# Patient Record
Sex: Female | Born: 1935 | Race: White | Hispanic: No | Smoking: Never smoker
Health system: Southern US, Community
[De-identification: ages and names within clinical notes are randomized; demographics above are authoritative.]

## PROBLEM LIST (undated history)

## (undated) DIAGNOSIS — D7582 Heparin induced thrombocytopenia (HIT): Secondary | ICD-10-CM

## (undated) DIAGNOSIS — I5022 Chronic systolic (congestive) heart failure: Secondary | ICD-10-CM

## (undated) DIAGNOSIS — F32A Depression, unspecified: Secondary | ICD-10-CM

## (undated) DIAGNOSIS — G2581 Restless legs syndrome: Secondary | ICD-10-CM

## (undated) DIAGNOSIS — M1990 Unspecified osteoarthritis, unspecified site: Secondary | ICD-10-CM

## (undated) DIAGNOSIS — M109 Gout, unspecified: Secondary | ICD-10-CM

## (undated) DIAGNOSIS — I10 Essential (primary) hypertension: Secondary | ICD-10-CM

## (undated) DIAGNOSIS — I517 Cardiomegaly: Secondary | ICD-10-CM

## (undated) DIAGNOSIS — F329 Major depressive disorder, single episode, unspecified: Secondary | ICD-10-CM

## (undated) DIAGNOSIS — I701 Atherosclerosis of renal artery: Secondary | ICD-10-CM

## (undated) DIAGNOSIS — I4891 Unspecified atrial fibrillation: Principal | ICD-10-CM

## (undated) DIAGNOSIS — E119 Type 2 diabetes mellitus without complications: Secondary | ICD-10-CM

## (undated) DIAGNOSIS — D649 Anemia, unspecified: Secondary | ICD-10-CM

## (undated) DIAGNOSIS — J449 Chronic obstructive pulmonary disease, unspecified: Secondary | ICD-10-CM

## (undated) DIAGNOSIS — I495 Sick sinus syndrome: Secondary | ICD-10-CM

## (undated) DIAGNOSIS — I2510 Atherosclerotic heart disease of native coronary artery without angina pectoris: Secondary | ICD-10-CM

## (undated) DIAGNOSIS — E039 Hypothyroidism, unspecified: Secondary | ICD-10-CM

## (undated) DIAGNOSIS — I729 Aneurysm of unspecified site: Secondary | ICD-10-CM

## (undated) DIAGNOSIS — K219 Gastro-esophageal reflux disease without esophagitis: Secondary | ICD-10-CM

## (undated) DIAGNOSIS — E785 Hyperlipidemia, unspecified: Secondary | ICD-10-CM

## (undated) DIAGNOSIS — N183 Chronic kidney disease, stage 3 unspecified: Secondary | ICD-10-CM

## (undated) DIAGNOSIS — J90 Pleural effusion, not elsewhere classified: Secondary | ICD-10-CM

## (undated) DIAGNOSIS — I255 Ischemic cardiomyopathy: Secondary | ICD-10-CM

## (undated) DIAGNOSIS — I739 Peripheral vascular disease, unspecified: Secondary | ICD-10-CM

## (undated) DIAGNOSIS — D75829 Heparin-induced thrombocytopenia, unspecified: Secondary | ICD-10-CM

## (undated) DIAGNOSIS — N289 Disorder of kidney and ureter, unspecified: Secondary | ICD-10-CM

## (undated) HISTORY — DX: Atherosclerotic heart disease of native coronary artery without angina pectoris: I25.10

## (undated) HISTORY — PX: CHOLECYSTECTOMY: SHX55

## (undated) HISTORY — PX: TONSILLECTOMY AND ADENOIDECTOMY: SUR1326

## (undated) HISTORY — DX: Unspecified osteoarthritis, unspecified site: M19.90

## (undated) HISTORY — DX: Ischemic cardiomyopathy: I25.5

## (undated) HISTORY — DX: Atherosclerosis of renal artery: I70.1

## (undated) HISTORY — DX: Hyperlipidemia, unspecified: E78.5

## (undated) HISTORY — DX: Essential (primary) hypertension: I10

## (undated) HISTORY — DX: Cardiomegaly: I51.7

## (undated) HISTORY — DX: Chronic systolic (congestive) heart failure: I50.22

## (undated) HISTORY — PX: THORACENTESIS: SHX235

## (undated) HISTORY — DX: Restless legs syndrome: G25.81

## (undated) HISTORY — DX: Aneurysm of unspecified site: I72.9

## (undated) HISTORY — DX: Peripheral vascular disease, unspecified: I73.9

## (undated) HISTORY — DX: Type 2 diabetes mellitus without complications: E11.9

---

## 1997-08-13 HISTORY — PX: CORONARY ANGIOPLASTY WITH STENT PLACEMENT: SHX49

## 1998-01-11 DIAGNOSIS — I729 Aneurysm of unspecified site: Secondary | ICD-10-CM

## 1998-01-11 HISTORY — DX: Aneurysm of unspecified site: I72.9

## 1998-01-11 HISTORY — PX: AV FISTULA REPAIR: SHX563

## 1998-02-03 ENCOUNTER — Inpatient Hospital Stay (HOSPITAL_COMMUNITY): Admission: EM | Admit: 1998-02-03 | Discharge: 1998-02-09 | Payer: Self-pay | Admitting: Cardiology

## 1998-05-25 ENCOUNTER — Emergency Department (HOSPITAL_COMMUNITY): Admission: EM | Admit: 1998-05-25 | Discharge: 1998-05-25 | Payer: Self-pay | Admitting: *Deleted

## 1998-05-25 ENCOUNTER — Encounter: Payer: Self-pay | Admitting: *Deleted

## 2001-06-03 ENCOUNTER — Encounter: Payer: Self-pay | Admitting: Emergency Medicine

## 2001-06-03 ENCOUNTER — Emergency Department (HOSPITAL_COMMUNITY): Admission: EM | Admit: 2001-06-03 | Discharge: 2001-06-03 | Payer: Self-pay | Admitting: Emergency Medicine

## 2001-11-13 ENCOUNTER — Emergency Department (HOSPITAL_COMMUNITY): Admission: EM | Admit: 2001-11-13 | Discharge: 2001-11-13 | Payer: Self-pay | Admitting: *Deleted

## 2001-11-13 ENCOUNTER — Encounter: Payer: Self-pay | Admitting: *Deleted

## 2002-03-24 ENCOUNTER — Emergency Department (HOSPITAL_COMMUNITY): Admission: EM | Admit: 2002-03-24 | Discharge: 2002-03-24 | Payer: Self-pay | Admitting: Emergency Medicine

## 2002-03-24 ENCOUNTER — Encounter: Payer: Self-pay | Admitting: Emergency Medicine

## 2002-04-02 ENCOUNTER — Encounter: Payer: Self-pay | Admitting: Orthopaedic Surgery

## 2002-04-02 ENCOUNTER — Ambulatory Visit (HOSPITAL_COMMUNITY): Admission: RE | Admit: 2002-04-02 | Discharge: 2002-04-02 | Payer: Self-pay | Admitting: Orthopaedic Surgery

## 2003-05-14 DIAGNOSIS — I779 Disorder of arteries and arterioles, unspecified: Secondary | ICD-10-CM

## 2003-05-14 HISTORY — PX: CAROTID ENDARTERECTOMY: SUR193

## 2003-05-14 HISTORY — PX: CORONARY ARTERY BYPASS GRAFT: SHX141

## 2003-05-14 HISTORY — DX: Disorder of arteries and arterioles, unspecified: I77.9

## 2003-06-08 ENCOUNTER — Encounter: Payer: Self-pay | Admitting: Emergency Medicine

## 2003-06-08 ENCOUNTER — Inpatient Hospital Stay (HOSPITAL_COMMUNITY): Admission: EM | Admit: 2003-06-08 | Discharge: 2003-06-20 | Payer: Self-pay | Admitting: Emergency Medicine

## 2003-06-09 ENCOUNTER — Encounter: Payer: Self-pay | Admitting: Cardiology

## 2003-06-11 ENCOUNTER — Encounter (INDEPENDENT_AMBULATORY_CARE_PROVIDER_SITE_OTHER): Payer: Self-pay | Admitting: *Deleted

## 2003-06-14 DIAGNOSIS — J90 Pleural effusion, not elsewhere classified: Secondary | ICD-10-CM

## 2003-06-14 HISTORY — DX: Pleural effusion, not elsewhere classified: J90

## 2003-06-14 HISTORY — PX: RENAL ARTERY STENT: SHX2321

## 2003-06-21 ENCOUNTER — Inpatient Hospital Stay (HOSPITAL_COMMUNITY): Admission: EM | Admit: 2003-06-21 | Discharge: 2003-07-01 | Payer: Self-pay | Admitting: Emergency Medicine

## 2003-06-21 ENCOUNTER — Encounter: Payer: Self-pay | Admitting: Internal Medicine

## 2003-08-16 ENCOUNTER — Encounter
Admission: RE | Admit: 2003-08-16 | Discharge: 2003-08-16 | Payer: Self-pay | Admitting: Thoracic Surgery (Cardiothoracic Vascular Surgery)

## 2004-03-22 ENCOUNTER — Inpatient Hospital Stay (HOSPITAL_COMMUNITY): Admission: EM | Admit: 2004-03-22 | Discharge: 2004-03-25 | Payer: Self-pay | Admitting: Emergency Medicine

## 2004-09-10 ENCOUNTER — Emergency Department (HOSPITAL_COMMUNITY): Admission: EM | Admit: 2004-09-10 | Discharge: 2004-09-10 | Payer: Self-pay | Admitting: Emergency Medicine

## 2004-09-11 ENCOUNTER — Inpatient Hospital Stay (HOSPITAL_COMMUNITY): Admission: EM | Admit: 2004-09-11 | Discharge: 2004-09-14 | Payer: Self-pay | Admitting: Emergency Medicine

## 2004-09-12 ENCOUNTER — Encounter: Payer: Self-pay | Admitting: Cardiology

## 2004-09-12 ENCOUNTER — Ambulatory Visit: Payer: Self-pay | Admitting: Cardiology

## 2004-09-18 ENCOUNTER — Ambulatory Visit: Payer: Self-pay | Admitting: Family Medicine

## 2004-10-19 ENCOUNTER — Ambulatory Visit: Payer: Self-pay | Admitting: Family Medicine

## 2004-12-15 ENCOUNTER — Emergency Department (HOSPITAL_COMMUNITY): Admission: EM | Admit: 2004-12-15 | Discharge: 2004-12-15 | Payer: Self-pay | Admitting: Emergency Medicine

## 2004-12-19 ENCOUNTER — Ambulatory Visit: Payer: Self-pay | Admitting: Family Medicine

## 2005-02-19 ENCOUNTER — Ambulatory Visit: Payer: Self-pay | Admitting: Family Medicine

## 2005-06-06 ENCOUNTER — Ambulatory Visit: Payer: Self-pay | Admitting: Family Medicine

## 2005-09-20 ENCOUNTER — Encounter: Payer: Self-pay | Admitting: Emergency Medicine

## 2005-09-20 ENCOUNTER — Inpatient Hospital Stay (HOSPITAL_COMMUNITY): Admission: AD | Admit: 2005-09-20 | Discharge: 2005-09-22 | Payer: Self-pay | Admitting: Cardiology

## 2005-09-20 ENCOUNTER — Ambulatory Visit: Payer: Self-pay | Admitting: Cardiology

## 2005-10-03 ENCOUNTER — Ambulatory Visit: Payer: Self-pay

## 2005-10-05 ENCOUNTER — Ambulatory Visit: Payer: Self-pay | Admitting: Internal Medicine

## 2005-10-08 ENCOUNTER — Ambulatory Visit: Payer: Self-pay | Admitting: Family Medicine

## 2005-11-26 ENCOUNTER — Ambulatory Visit: Payer: Self-pay | Admitting: Family Medicine

## 2005-11-28 ENCOUNTER — Ambulatory Visit: Payer: Self-pay | Admitting: Cardiology

## 2005-12-06 ENCOUNTER — Ambulatory Visit (HOSPITAL_COMMUNITY): Admission: RE | Admit: 2005-12-06 | Discharge: 2005-12-06 | Payer: Self-pay | Admitting: Orthopaedic Surgery

## 2006-08-09 ENCOUNTER — Ambulatory Visit: Payer: Self-pay | Admitting: Family Medicine

## 2006-09-29 ENCOUNTER — Emergency Department (HOSPITAL_COMMUNITY): Admission: EM | Admit: 2006-09-29 | Discharge: 2006-09-29 | Payer: Self-pay | Admitting: Emergency Medicine

## 2006-10-16 ENCOUNTER — Ambulatory Visit (HOSPITAL_COMMUNITY): Admission: RE | Admit: 2006-10-16 | Discharge: 2006-10-16 | Payer: Self-pay | Admitting: Ophthalmology

## 2006-10-31 ENCOUNTER — Ambulatory Visit (HOSPITAL_COMMUNITY): Admission: RE | Admit: 2006-10-31 | Discharge: 2006-10-31 | Payer: Self-pay | Admitting: Ophthalmology

## 2007-03-26 ENCOUNTER — Ambulatory Visit: Payer: Self-pay | Admitting: Cardiology

## 2008-04-05 ENCOUNTER — Inpatient Hospital Stay (HOSPITAL_COMMUNITY): Admission: EM | Admit: 2008-04-05 | Discharge: 2008-04-09 | Payer: Self-pay | Admitting: Emergency Medicine

## 2008-04-05 ENCOUNTER — Ambulatory Visit: Payer: Self-pay | Admitting: Cardiovascular Disease

## 2008-04-10 ENCOUNTER — Inpatient Hospital Stay (HOSPITAL_COMMUNITY): Admission: EM | Admit: 2008-04-10 | Discharge: 2008-04-12 | Payer: Self-pay | Admitting: Emergency Medicine

## 2008-04-28 ENCOUNTER — Ambulatory Visit: Payer: Self-pay | Admitting: Cardiology

## 2008-05-10 ENCOUNTER — Ambulatory Visit: Payer: Self-pay

## 2008-09-22 ENCOUNTER — Ambulatory Visit: Payer: Self-pay | Admitting: Cardiology

## 2008-11-01 ENCOUNTER — Emergency Department (HOSPITAL_COMMUNITY): Admission: EM | Admit: 2008-11-01 | Discharge: 2008-11-01 | Payer: Self-pay | Admitting: Emergency Medicine

## 2008-11-05 ENCOUNTER — Other Ambulatory Visit: Payer: Self-pay | Admitting: Surgery

## 2008-11-07 ENCOUNTER — Inpatient Hospital Stay (HOSPITAL_COMMUNITY): Admission: EM | Admit: 2008-11-07 | Discharge: 2008-11-09 | Payer: Self-pay | Admitting: Emergency Medicine

## 2008-11-08 ENCOUNTER — Encounter (INDEPENDENT_AMBULATORY_CARE_PROVIDER_SITE_OTHER): Payer: Self-pay | Admitting: Surgery

## 2009-07-04 ENCOUNTER — Encounter (INDEPENDENT_AMBULATORY_CARE_PROVIDER_SITE_OTHER): Payer: Self-pay | Admitting: *Deleted

## 2009-07-05 ENCOUNTER — Inpatient Hospital Stay (HOSPITAL_COMMUNITY): Admission: EM | Admit: 2009-07-05 | Discharge: 2009-07-06 | Payer: Self-pay | Admitting: Emergency Medicine

## 2009-07-06 ENCOUNTER — Ambulatory Visit: Payer: Self-pay | Admitting: Cardiology

## 2009-07-12 ENCOUNTER — Ambulatory Visit: Payer: Self-pay | Admitting: Cardiovascular Disease

## 2009-07-12 ENCOUNTER — Ambulatory Visit (HOSPITAL_COMMUNITY): Admission: RE | Admit: 2009-07-12 | Discharge: 2009-07-12 | Payer: Self-pay | Admitting: Cardiology

## 2009-07-12 ENCOUNTER — Encounter (INDEPENDENT_AMBULATORY_CARE_PROVIDER_SITE_OTHER): Payer: Self-pay | Admitting: Internal Medicine

## 2009-07-12 ENCOUNTER — Encounter: Payer: Self-pay | Admitting: Cardiology

## 2009-07-12 ENCOUNTER — Ambulatory Visit: Payer: Self-pay

## 2009-07-13 ENCOUNTER — Telehealth: Payer: Self-pay | Admitting: Cardiology

## 2009-08-09 DIAGNOSIS — M1990 Unspecified osteoarthritis, unspecified site: Secondary | ICD-10-CM | POA: Insufficient documentation

## 2009-08-09 DIAGNOSIS — N183 Chronic kidney disease, stage 3 (moderate): Secondary | ICD-10-CM

## 2009-08-09 DIAGNOSIS — E785 Hyperlipidemia, unspecified: Secondary | ICD-10-CM

## 2009-08-09 DIAGNOSIS — I10 Essential (primary) hypertension: Secondary | ICD-10-CM | POA: Insufficient documentation

## 2009-08-09 DIAGNOSIS — E119 Type 2 diabetes mellitus without complications: Secondary | ICD-10-CM | POA: Insufficient documentation

## 2009-08-09 DIAGNOSIS — I701 Atherosclerosis of renal artery: Secondary | ICD-10-CM

## 2009-08-09 DIAGNOSIS — G2581 Restless legs syndrome: Secondary | ICD-10-CM

## 2009-08-09 DIAGNOSIS — I2510 Atherosclerotic heart disease of native coronary artery without angina pectoris: Secondary | ICD-10-CM

## 2009-08-09 DIAGNOSIS — I080 Rheumatic disorders of both mitral and aortic valves: Secondary | ICD-10-CM | POA: Insufficient documentation

## 2009-08-29 ENCOUNTER — Encounter: Payer: Self-pay | Admitting: Cardiology

## 2009-08-30 ENCOUNTER — Inpatient Hospital Stay (HOSPITAL_COMMUNITY): Admission: EM | Admit: 2009-08-30 | Discharge: 2009-09-06 | Payer: Self-pay | Admitting: Emergency Medicine

## 2009-08-30 ENCOUNTER — Ambulatory Visit: Payer: Self-pay | Admitting: Internal Medicine

## 2009-09-01 ENCOUNTER — Ambulatory Visit: Payer: Self-pay | Admitting: Gastroenterology

## 2009-09-02 ENCOUNTER — Ambulatory Visit: Payer: Self-pay | Admitting: Cardiology

## 2009-09-12 ENCOUNTER — Emergency Department (HOSPITAL_COMMUNITY): Admission: EM | Admit: 2009-09-12 | Discharge: 2009-09-12 | Payer: Self-pay | Admitting: Emergency Medicine

## 2009-09-23 ENCOUNTER — Ambulatory Visit: Payer: Self-pay | Admitting: Internal Medicine

## 2009-09-23 ENCOUNTER — Inpatient Hospital Stay (HOSPITAL_COMMUNITY): Admission: EM | Admit: 2009-09-23 | Discharge: 2009-10-06 | Payer: Self-pay | Admitting: Emergency Medicine

## 2009-09-30 ENCOUNTER — Encounter (INDEPENDENT_AMBULATORY_CARE_PROVIDER_SITE_OTHER): Payer: Self-pay | Admitting: Family Medicine

## 2009-10-03 ENCOUNTER — Encounter: Payer: Self-pay | Admitting: Cardiology

## 2009-10-03 ENCOUNTER — Other Ambulatory Visit: Payer: Self-pay | Admitting: Cardiology

## 2009-10-03 ENCOUNTER — Ambulatory Visit: Payer: Self-pay | Admitting: Cardiology

## 2009-10-04 ENCOUNTER — Other Ambulatory Visit: Payer: Self-pay | Admitting: Cardiology

## 2009-10-05 ENCOUNTER — Other Ambulatory Visit: Payer: Self-pay | Admitting: Cardiology

## 2009-10-06 ENCOUNTER — Other Ambulatory Visit: Payer: Self-pay | Admitting: Cardiology

## 2009-10-10 ENCOUNTER — Inpatient Hospital Stay (HOSPITAL_COMMUNITY): Admission: EM | Admit: 2009-10-10 | Discharge: 2009-10-13 | Payer: Self-pay | Admitting: Emergency Medicine

## 2009-10-10 ENCOUNTER — Ambulatory Visit: Payer: Self-pay | Admitting: Cardiovascular Disease

## 2009-12-05 ENCOUNTER — Encounter: Payer: Self-pay | Admitting: Cardiology

## 2009-12-07 ENCOUNTER — Ambulatory Visit: Payer: Self-pay | Admitting: Cardiology

## 2010-03-02 ENCOUNTER — Inpatient Hospital Stay (HOSPITAL_COMMUNITY)
Admission: EM | Admit: 2010-03-02 | Discharge: 2010-03-04 | Payer: Self-pay | Source: Home / Self Care | Admitting: Emergency Medicine

## 2010-03-02 ENCOUNTER — Ambulatory Visit: Payer: Self-pay | Admitting: Internal Medicine

## 2010-03-07 ENCOUNTER — Encounter: Payer: Self-pay | Admitting: Cardiology

## 2010-03-14 ENCOUNTER — Encounter: Payer: Self-pay | Admitting: Cardiology

## 2010-03-18 IMAGING — CR DG CHEST 1V PORT
1 series · 1 of 1 positions shown · non-contrast
Comparison: PA and lateral chest 09/12/2009.

CLINICAL DATA: Nausea vomiting.  Epigastric pain.

PORTABLE CHEST - 1 VIEW

[view not recorded]
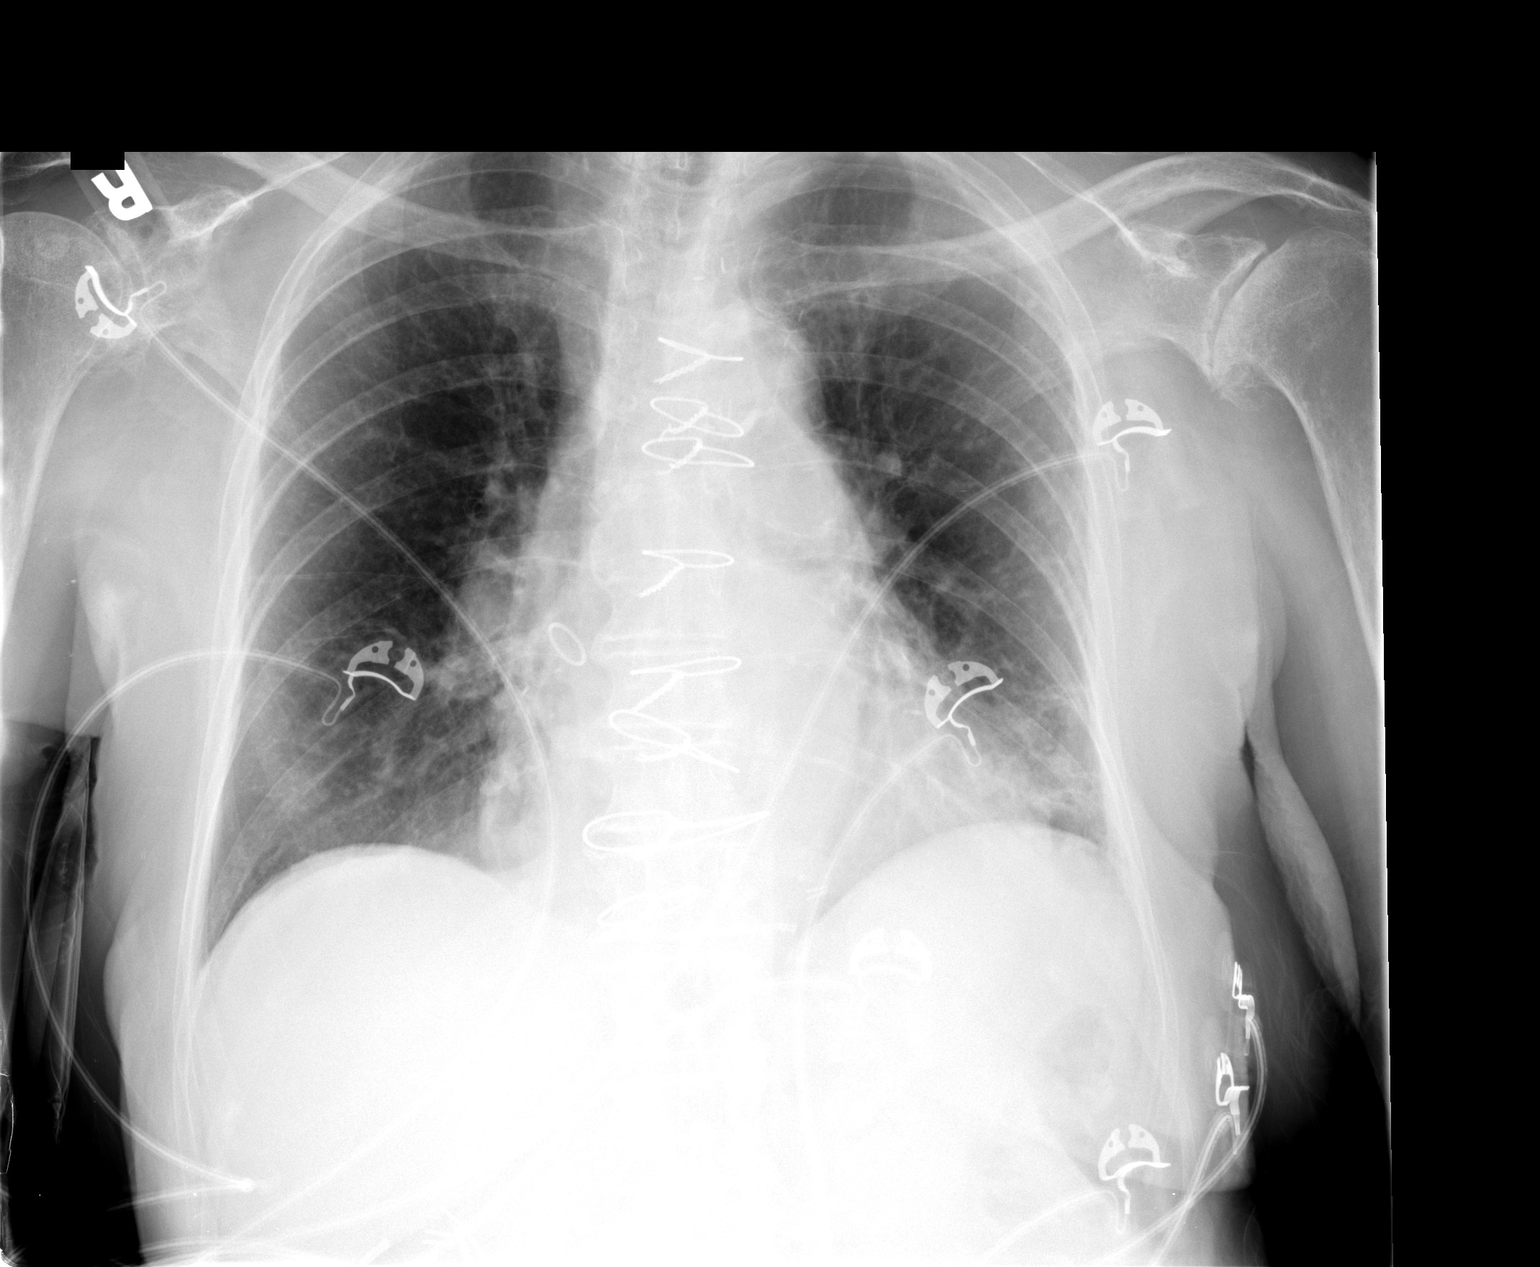

[1 of 1 positions shown; findings below may reference images not displayed]

FINDINGS: There is patchy left basilar airspace disease.  Right
lung clear.  No effusion.  Heart size normal.
IMPRESSION: Patchy left basilar air space disease could be due to atelectasis
or pneumonia.

## 2010-03-19 IMAGING — CR DG CHEST DECUBITUS*L*
1 series · 1 of 1 positions shown · non-contrast
Comparison: 10/10/2009

CLINICAL DATA: Shortness of breath, follow up pleural effusion

CHEST - LEFT DECUBITUS

[w chest decub.]
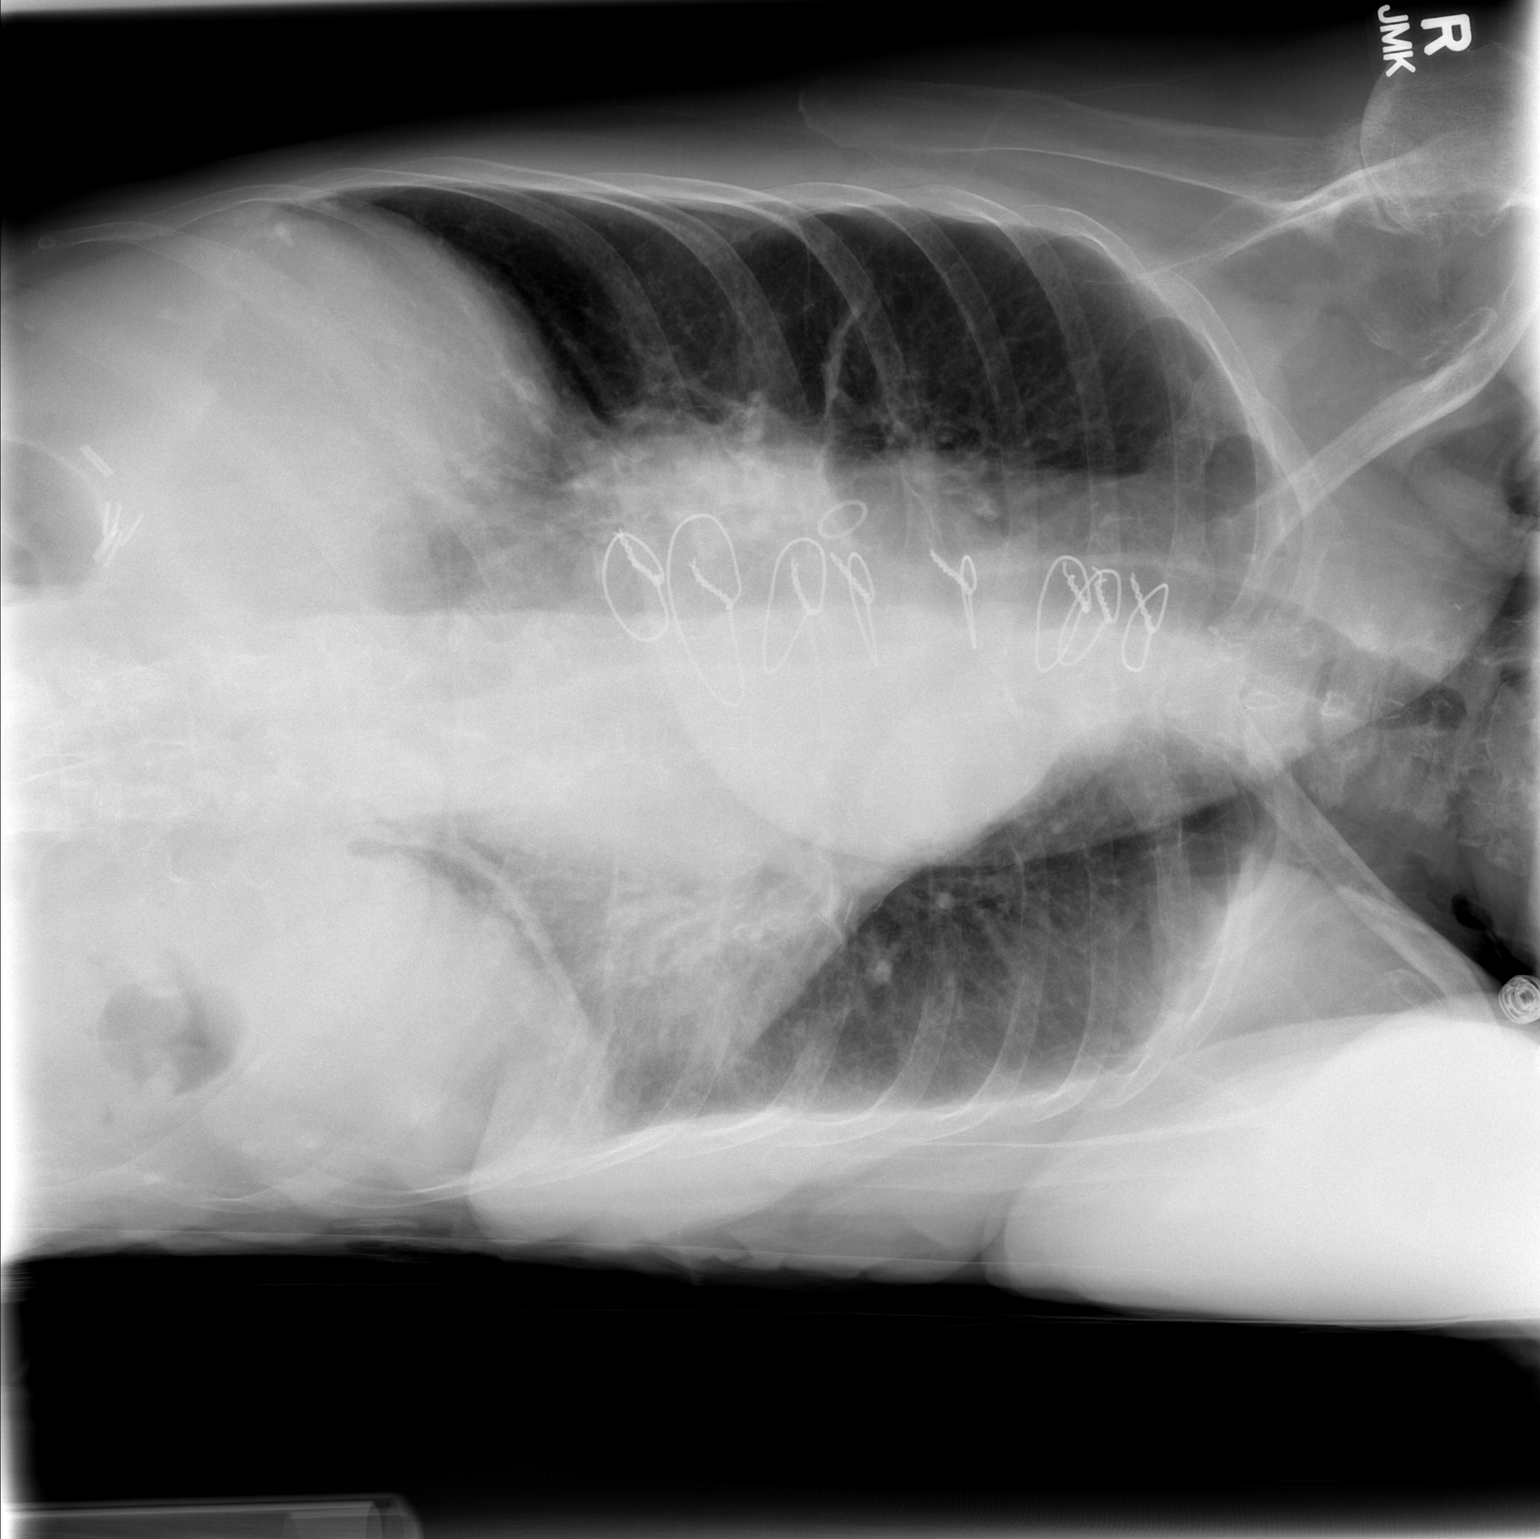

[1 of 1 positions shown; findings below may reference images not displayed]

FINDINGS: Trace, if any, left pleural fluid is identified without
evidence for loculation.  Right upper lobe atelectasis noted.
Evidence of prior CABG with persistent enlargement of the
cardiomediastinal silhouette noted. Right shoulder degenerative
changes are noted.
IMPRESSION: No significant left pleural effusion.

## 2010-03-22 ENCOUNTER — Ambulatory Visit: Payer: Self-pay | Admitting: Cardiology

## 2010-07-19 ENCOUNTER — Encounter: Payer: Self-pay | Admitting: Cardiology

## 2010-07-19 ENCOUNTER — Ambulatory Visit: Payer: Self-pay | Admitting: Cardiology

## 2010-07-24 ENCOUNTER — Encounter: Payer: Self-pay | Admitting: Cardiology

## 2010-07-24 ENCOUNTER — Emergency Department (HOSPITAL_COMMUNITY)
Admission: EM | Admit: 2010-07-24 | Discharge: 2010-07-24 | Payer: Self-pay | Source: Home / Self Care | Admitting: Internal Medicine

## 2010-09-03 ENCOUNTER — Encounter: Payer: Self-pay | Admitting: Cardiology

## 2010-09-12 NOTE — Miscellaneous (Signed)
  Clinical Lists Changes  Observations: Added new observation of CXR RESULTS: Findings: There is patchy left basilar airspace disease.  Right   lung clear.  No effusion.  Heart size normal.    IMPRESSION:   Patchy left basilar air space disease could be due to atelectasis   or pneumonia. (10/10/2009 9:24) Added new observation of TEEFINDING: - Left ventricle: The cavity size was normal. Wall thickness was     normal. Systolic function was moderately reduced. The estimated     ejection fraction was in the range of 35% to 40%.   - Aortic valve: Trileaflet; mildly calcified leaflets. There was no     stenosis. Mild regurgitation.   - Aorta: Normal caliber. Grade IV plaque in the aortic arch and     descending thoracic aorta.   - Mitral valve: Moderate regurgitation.   - Left atrium: The atrium was mildly dilated. No evidence of     thrombus in the atrial cavity or appendage.   - Right ventricle: Systolic function was mildly reduced.   - Right atrium: The atrium was mildly dilated. No evidence of     thrombus in the atrial cavity or appendage.   - Atrial septum: No defect or patent foramen ovale was identified.     Echo contrast study showed no right-to-left atrial level shunt, at     baseline or with provocation.   Impressions:    - EF appears moderately depressed but patient was in atrial     fibrillation with RVR. Would consider repeating study in sinus     rhythm. No LAA thrombus, ok for cardioversion.   Transesophageal echocardiography. 2D and color Doppler.  (10/03/2009 9:24)      TE Echocardiogram  Procedure date:  10/03/2009  Findings:      - Left ventricle: The cavity size was normal. Wall thickness was     normal. Systolic function was moderately reduced. The estimated     ejection fraction was in the range of 35% to 40%.   - Aortic valve: Trileaflet; mildly calcified leaflets. There was no     stenosis. Mild regurgitation.   - Aorta: Normal caliber. Grade IV  plaque in the aortic arch and     descending thoracic aorta.   - Mitral valve: Moderate regurgitation.   - Left atrium: The atrium was mildly dilated. No evidence of     thrombus in the atrial cavity or appendage.   - Right ventricle: Systolic function was mildly reduced.   - Right atrium: The atrium was mildly dilated. No evidence of     thrombus in the atrial cavity or appendage.   - Atrial septum: No defect or patent foramen ovale was identified.     Echo contrast study showed no right-to-left atrial level shunt, at     baseline or with provocation.   Impressions:    - EF appears moderately depressed but patient was in atrial     fibrillation with RVR. Would consider repeating study in sinus     rhythm. No LAA thrombus, ok for cardioversion.   Transesophageal echocardiography. 2D and color Doppler.   CXR  Procedure date:  10/10/2009  Findings:      Findings: There is patchy left basilar airspace disease.  Right   lung clear.  No effusion.  Heart size normal.    IMPRESSION:   Patchy left basilar air space disease could be due to atelectasis   or pneumonia.

## 2010-09-12 NOTE — Miscellaneous (Signed)
Clinical Lists Changes  Observations: Added new observation of CARDCATHFIND: 1. Hemodynamics:  LV 172/12, AO 178/73. 2. Coronaries:  The main was normal.  The LAD was heavily calcified.     There was long proximal tandem 80% lesions.  There was long mid 95%     stenosis.  There was distal 40% stenosis.  Ramus intermediate was     large with mid 40% stenosis.  It was a branching vessel.  The right     coronary artery was occluded proximally.  There was mid diffuse     severe disease.  PDA was large with mild nonobstructive disease. 3. Grafts:  A LIMA to the LAD was widely patent.  Saphenous vein graft     to the PDA was widely patent. 4. Left ventriculogram:  The left ventriculogram was obtained in the     RAO projection.  The EF was 65% with normal wall motion.   CONCLUSION:  Severe two-vessel coronary artery disease.  Patent grafts with a preserved ejection fraction.   PLAN:  The patient will have medical management. (03/03/2010 10:33) Added new observation of TEEFINDING:   - Left ventricle: The cavity size was normal. Wall thickness was     normal. Systolic function was moderately reduced. The estimated     ejection fraction was in the range of 35% to 40%.   - Aortic valve: Trileaflet; mildly calcified leaflets. There was no     stenosis. Mild regurgitation.   - Aorta: Normal caliber. Grade IV plaque in the aortic arch and     descending thoracic aorta.   - Mitral valve: Moderate regurgitation.   - Left atrium: The atrium was mildly dilated. No evidence of     thrombus in the atrial cavity or appendage.   - Right ventricle: Systolic function was mildly reduced.   - Right atrium: The atrium was mildly dilated. No evidence of     thrombus in the atrial cavity or appendage.   - Atrial septum: No defect or patent foramen ovale was identified.     Echo contrast study showed no right-to-left atrial level shunt, at     baseline or with provocation.   Impressions:    - EF appears  moderately depressed but patient was in atrial     fibrillation with RVR. Would consider repeating study in sinus     rhythm. No LAA thrombus, ok for cardioversion. (10/03/2009 10:33) Added new observation of ECHOINTERP:  - Left ventricle: The cavity size was normal. Wall thickness was     increased in a pattern of mild LVH. Systolic function was mildly     to moderately reduced. The estimated ejection fraction was in the     range of 40% to 45%. There is hypokinesis of the posterior basal     wall.. Doppler parameters are consistent with high ventricular     filling pressure.   - Aortic valve: Moderate regurgitation.   - Mitral valve: Moderate regurgitation.   - Left atrium: The atrium was mildly dilated.   - Right atrium: The atrium was mildly dilated.   - Tricuspid valve: Moderate regurgitation.   - Pulmonary arteries: Systolic pressure was moderately increased. PA     peak pressure: 59mm Hg (S).  (09/30/2009 10:34)      Cardiac Cath  Procedure date:  03/03/2010  Findings:      1. Hemodynamics:  LV 172/12, AO 178/73. 2. Coronaries:  The main was normal.  The LAD was heavily calcified.  There was long proximal tandem 80% lesions.  There was long mid 95%     stenosis.  There was distal 40% stenosis.  Ramus intermediate was     large with mid 40% stenosis.  It was a branching vessel.  The right     coronary artery was occluded proximally.  There was mid diffuse     severe disease.  PDA was large with mild nonobstructive disease. 3. Grafts:  A LIMA to the LAD was widely patent.  Saphenous vein graft     to the PDA was widely patent. 4. Left ventriculogram:  The left ventriculogram was obtained in the     RAO projection.  The EF was 65% with normal wall motion.   CONCLUSION:  Severe two-vessel coronary artery disease.  Patent grafts with a preserved ejection fraction.   PLAN:  The patient will have medical management.  TE Echocardiogram  Procedure date:   10/03/2009  Findings:        - Left ventricle: The cavity size was normal. Wall thickness was     normal. Systolic function was moderately reduced. The estimated     ejection fraction was in the range of 35% to 40%.   - Aortic valve: Trileaflet; mildly calcified leaflets. There was no     stenosis. Mild regurgitation.   - Aorta: Normal caliber. Grade IV plaque in the aortic arch and     descending thoracic aorta.   - Mitral valve: Moderate regurgitation.   - Left atrium: The atrium was mildly dilated. No evidence of     thrombus in the atrial cavity or appendage.   - Right ventricle: Systolic function was mildly reduced.   - Right atrium: The atrium was mildly dilated. No evidence of     thrombus in the atrial cavity or appendage.   - Atrial septum: No defect or patent foramen ovale was identified.     Echo contrast study showed no right-to-left atrial level shunt, at     baseline or with provocation.   Impressions:    - EF appears moderately depressed but patient was in atrial     fibrillation with RVR. Would consider repeating study in sinus     rhythm. No LAA thrombus, ok for cardioversion.  Echocardiogram  Procedure date:  09/30/2009  Findings:       - Left ventricle: The cavity size was normal. Wall thickness was     increased in a pattern of mild LVH. Systolic function was mildly     to moderately reduced. The estimated ejection fraction was in the     range of 40% to 45%. There is hypokinesis of the posterior basal     wall.. Doppler parameters are consistent with high ventricular     filling pressure.   - Aortic valve: Moderate regurgitation.   - Mitral valve: Moderate regurgitation.   - Left atrium: The atrium was mildly dilated.   - Right atrium: The atrium was mildly dilated.   - Tricuspid valve: Moderate regurgitation.   - Pulmonary arteries: Systolic pressure was moderately increased. PA     peak pressure: 59mm Hg (S).

## 2010-09-12 NOTE — Assessment & Plan Note (Signed)
Summary: [name] [name]      Allergies Added:   Visit Type:  Follow-up Primary Provider:  Dr. Ardeen [name]  CC:  CAD.  History of Present Illness: The patient returns for followup. She said she has been doing well and denies any ongoing cardiovascular symptoms. She has had no chest pressure, neck or arm discomfort. She has had no palpitations, presyncope or syncope. He denies any shortness of breath, PND or orthopnea. She tolerates her blood thinner.  Current Medications (verified): 1)  Hydrocodone-Acetaminophen 7.5-750 Mg Tabs (Hydrocodone-Acetaminophen) .... 2-4 Tabs As Needed 2)  Omeprazole 20 Mg Cpdr (Omeprazole) .Marland Kitchen.. 1 By Mouth Daily 3)  Klor-Con M20 20 Meq Cr-Tabs (Potassium Chloride Crys Cr) .Marland Kitchen.. 1 By Mouth Daily 4)  Alprazolam 0.25 Mg Tabs (Alprazolam) .... As Directed 5)  Coreg 3.125 Mg Tabs (Carvedilol) .... 3 By Mouth Two Times A Day 6)  Warfarin Sodium 3 Mg Tabs (Warfarin Sodium) .... As Directed 7)  Simvastatin 20 Mg Tabs (Simvastatin) .Marland Kitchen.. 1 P Odaily 8)  Enalapril Maleate 5 Mg Tabs (Enalapril Maleate) .Marland Kitchen.. 1 By Mouth Two Times A Day 9)  Furosemide 40 Mg Tabs (Furosemide) .Marland Kitchen.. 1 By Mouth Daily 10)  Nitrostat 0.4 Mg Subl (Nitroglycerin) .... As Needed 11)  Colace 100 Mg Caps (Docusate Sodium) .Marland Kitchen.. 1 By Mouth Daily  Allergies (verified): 1)  ! Heparin 2)  ! * Altace 3)  ! * Ivp Dye 4)  ! * Dilaudid  Past History:  Past Medical History: Reviewed history from 03/22/2010 and no changes required.  1. Coronary artery disease (status post LAD stenting in 1999.  CABG in       2004 with a LIMA to the LAD and sequential SVG to PDA.  Cath 02/2009  95% LAD       with a patent LIMA to LAD. Ramus intermedius had a 40% stenosis.  The RCA        was occluded, but the SVG  to PDA was widely patent and her EF was 65%.)  2. Hypertension.   3. Hyperlipidemia.   4. Diabetes mellitus (the patient reports that she does not have this       and is not on therapy)  5. Bilateral renal  artery stenosis, status post stenting.   6. Carotid artery disease, status post right carotid endarterectomy.   7. Degenerative joint disease.   8. Pseudoaneurysm, status post repair following catheterization in       1999.   9. Contrast allergy.   10.Mild mitral regurgitation.   11.Mild renal insufficiency.   12.Restless leg syndrome.      Past Surgical History: Reviewed history from 08/09/2009 and no changes required.  1. CABG in 2004.   2. Right carotid endarterectomy.   3. Laparoscopic cholecystectomy in 2010.   4. Repair of pseudoaneurysm after cardiac catheterization.   5. Repair of right rotator cuff.   Review of Systems       As stated in the HPI and negative for all other systems.   Vital Signs:  Patient profile:   77 year old female Height:      64 inches Weight:      115 pounds BMI:     19.81 Pulse rate:   108 / minute Resp:     16 per minute BP sitting:   138 / 76  (right arm)  Vitals Entered By: [name] Coy, CNA (July 19, 2010 1:06 PM)  Physical Exam  General:  Well developed, well nourished, in no acute  distress. Head:  normocephalic and atraumatic Mouth:   Oral mucosa normal. Neck:  Neck supple, no JVD. No masses, thyromegaly or abnormal cervical nodes, left bruit. Chest Wall:  Well healed sternotomy scar Lungs:  Clear bilaterally to auscultation and percussion. Heart:  Non-displaced PMI, chest non-tender; irregular rate and rhythm, S1, S2 without murmurs, rubs or gallops. Carotid upstroke normal. . Normal abdominal aortic size, no bruits. Femorals normal pulses, no bruits. Pedals normal pulses. No edema, no varicosities. Abdomen:  Bowel sounds positive; abdomen soft and non-tender without masses, organomegaly, or hernias noted. No hepatosplenomegaly. Msk:  Back normal, normal gait. Muscle strength and tone normal. Extremities:  No clubbing or cyanosis. Neurologic:  Alert and oriented x 3. Skin:  Intact without lesions or rashes. Cervical Nodes:   no significant adenopathy Psych:  Normal affect.   EKG  Procedure date:  07/19/2010  Findings:      Atrial fibrillation, rightward axis, diffuse ST-T wave changes ,nonspecific  Impression & Recommendations:  Problem # 1:  ATRIAL FIBRILLATION (ICD-427.31)  She tolerates Coumadin. Her aspirin has been stopped because she is on this and I agree with this. No change in therapy is indicated.  Orders: EKG w/ Interpretation (93000)  Problem # 2:  CAD (ICD-414.00)  She is having no chest discomfort. No change in therapy is indicated.  The following medications were removed from the medication list:    Aspirin 81 Mg Tabs (Aspirin) .Marland Kitchen... 1 podaily Her updated medication list for this problem includes:    Coreg 3.125 Mg Tabs (Carvedilol) .Marland KitchenMarland KitchenMarland KitchenMarland Kitchen 3 by mouth two times a day    Warfarin Sodium 3 Mg Tabs (Warfarin sodium) .Marland Kitchen... As directed    Enalapril Maleate 5 Mg Tabs (Enalapril maleate) .Marland Kitchen... 1 by mouth two times a day    Nitrostat 0.4 Mg Subl (Nitroglycerin) .Marland Kitchen... As needed  Orders: EKG w/ Interpretation (93000)  Problem # 3:  HYPERTENSION (ICD-401.9) Her blood pressure is controlled and she will continue the meds as listed.  Patient Instructions: 1)  Your physician recommends that you schedule a follow-up appointment in: 1 year 2)  Your physician recommends that you continue on your current medications as directed. Please refer to the Current Medication list given to you today.

## 2010-09-12 NOTE — Miscellaneous (Signed)
Summary: [facility]   Imported By: Roderic Ovens 09/19/2009 13:25:30  _____________________________________________________________________  External Attachment:    Type:   Image     Comment:   External Document

## 2010-09-12 NOTE — Assessment & Plan Note (Signed)
Summary: [facility]  Medications Added ASPIRIN 81 MG  TABS (ASPIRIN) 1 podaily POLYETHYLENE GLYCOL 3350  PACK (POLYETHYLENE GLYCOL 3350) as directed OMEPRAZOLE 20 MG CPDR (OMEPRAZOLE) 1 by mouth daily KLOR-CON M20 20 MEQ CR-TABS (POTASSIUM CHLORIDE CRYS CR) 1 by mouth two times a day ALPRAZOLAM 0.25 MG TABS (ALPRAZOLAM) as directed COREG 3.125 MG TABS (CARVEDILOL) 3 by mouth two times a day WARFARIN SODIUM 3 MG TABS (WARFARIN SODIUM) as directed SIMVASTATIN 20 MG TABS (SIMVASTATIN) 1 p odaily ENALAPRIL MALEATE 5 MG TABS (ENALAPRIL MALEATE) 1 by mouth two times a day LIDODERM 5 % PTCH (LIDOCAINE) as direted FUROSEMIDE 40 MG TABS (FUROSEMIDE) 1 by mouth daily      Allergies Added: ! HEPARIN ! * ALTACE ! * IVP DYE  Visit Type:  Follow-up Primary Provider:  Dr. Ardeen Garland  CC:  Atrial Fibrillation.  History of Present Illness: The patient presents for follow up after a recent hospitalization for CHF and atrial fibrillation.  She underwent TEE guided cardioversion.  Since that hospitalization she has improved.  She denies chest pain, neck or arm pain.  She has had no further shortness of breath.  She denies palpitations, presyncope or syncope.  She has had no weight gain or edema..  Current Medications (verified): 1)  Hydrocodone-Acetaminophen 7.5-750 Mg Tabs (Hydrocodone-Acetaminophen) .... 2-4 Tabs As Needed 2)  Aspirin 81 Mg  Tabs (Aspirin) .Marland Kitchen.. 1 Podaily 3)  Polyethylene Glycol 3350  Pack (Polyethylene Glycol 3350) .... As Directed 4)  Omeprazole 20 Mg Cpdr (Omeprazole) .Marland Kitchen.. 1 By Mouth Daily 5)  Klor-Con M20 20 Meq Cr-Tabs (Potassium Chloride Crys Cr) .Marland Kitchen.. 1 By Mouth Two Times A Day 6)  Alprazolam 0.25 Mg Tabs (Alprazolam) .... As Directed 7)  Coreg 3.125 Mg Tabs (Carvedilol) .... 3 By Mouth Two Times A Day 8)  Warfarin Sodium 3 Mg Tabs (Warfarin Sodium) .... As Directed 9)  Simvastatin 20 Mg Tabs (Simvastatin) .Marland Kitchen.. 1 P Odaily 10)  Enalapril Maleate 5 Mg Tabs (Enalapril  Maleate) .Marland Kitchen.. 1 By Mouth Two Times A Day 11)  Lidoderm 5 % Ptch (Lidocaine) .... As Direted 12)  Furosemide 40 Mg Tabs (Furosemide) .Marland Kitchen.. 1 By Mouth Daily  Allergies (verified): 1)  ! Heparin 2)  ! * Altace 3)  ! * Ivp Dye  Past History:  Past Medical History: Reviewed history from 08/09/2009 and no changes required.  1. Coronary artery disease (status post LAD stenting in 1999.  CABG in       2004 with a LIMA to the LAD and sequential SVG to PDA.  Her most       recent catheterization was in August 2009).   2. Hypertension.   3. Hyperlipidemia.   4. Diabetes mellitus (the patient reports that she does not have this       and is not on therapy for).   5. Bilateral renal artery stenosis, status post stenting.   6. Carotid artery disease, status post right carotid endarterectomy.   7. Degenerative joint disease.   8. Pseudoaneurysm, status post repair following catheterization in       1999.   9. Contrast allergy.   10.Mild mitral regurgitation.   11.Mild renal insufficiency.   12.Restless leg syndrome.      Past Surgical History: Reviewed history from 08/09/2009 and no changes required.  1. CABG in 2004.   2. Right carotid endarterectomy.   3. Laparoscopic cholecystectomy in 2010.   4. Repair of pseudoaneurysm after cardiac catheterization.   5. Repair of right  rotator cuff.   Review of Systems       As stated in the HPI and negative for all other systems.   Vital Signs:  Patient profile:   77 year old female Height:      64 inches Weight:      112 pounds BMI:     19.29 Pulse rate:   65 / minute Resp:     16 per minute BP sitting:   198 / 82  (right arm)  Vitals Entered By: Marrion Coy, CNA (December 07, 2009 12:12 PM)  Physical Exam  General:  Well developed, well nourished, in no acute distress. Head:  normocephalic and atraumatic Eyes:  PERRLA/EOM intact; conjunctiva and lids normal. Mouth:  Teeth, gums and palate normal. Oral mucosa normal. Neck:  Neck  supple, no JVD. No masses, thyromegaly or abnormal cervical nodes, left bruit. Chest Wall:  no deformities or breast masses noted Lungs:  Clear bilaterally to auscultation and percussion. Heart:  Non-displaced PMI, chest non-tender; regular rate and rhythm, S1, S2 without murmurs, rubs or gallops. Carotid upstroke normal. . Normal abdominal aortic size, no bruits. Femorals normal pulses, no bruits. Pedals normal pulses. No edema, no varicosities. Abdomen:  Bowel sounds positive; abdomen soft and non-tender without masses, organomegaly, or hernias noted. No hepatosplenomegaly. Msk:  Back normal, normal gait. Muscle strength and tone normal. Extremities:  No clubbing or cyanosis. Neurologic:  Alert and oriented x 3. Skin:  Intact without lesions or rashes. Psych:  Normal affect.   EKG  Procedure date:  12/07/2009  Findings:      NSR, no acute ST T wave changes.  Impression & Recommendations:  Problem # 1:  ATRIAL FIBRILLATION (ICD-427.31) She has had no recurrence of symptomatic atrial fibrillation.  She will continue with meds as listed. Orders: EKG w/ Interpretation (93000)  Problem # 2:  HYPERTENSION (ICD-401.9) Her blood pressure is elevated today.  However, she did not take her medications today.  She and her daughter report that her blood pressure has been well controlled.  She will keep a blood pressure diary.  I will make adjustments based on this.  Problem # 3:  CAD (ICD-414.00) She has no ongoing symptoms.   No futher cardiovascular testing is indicated. Orders: EKG w/ Interpretation (93000)  Patient Instructions: 1)  Your physician recommends that you schedule a follow-up appointment in: 3 MONTHS 2)  Your physician recommends that you continue on your current medications as directed. Please refer to the Current Medication list given to you today. 3)  You have been diagnosed with atrial fibrillation.  Atrial fibrillation is a condition in which one of the upper chambers  of the heart has extra electrical cells causing it to beat very fast.  Please see the handout/brochure given to you today for further information.

## 2010-09-12 NOTE — Assessment & Plan Note (Signed)
Summary: [facility]  Medications Added KLOR-CON M20 20 MEQ CR-TABS (POTASSIUM CHLORIDE CRYS CR) 1 by mouth daily ACETAMINOPHEN 325 MG  TABS (ACETAMINOPHEN) as needed NITROSTAT 0.4 MG SUBL (NITROGLYCERIN) as needed COLACE 100 MG CAPS (DOCUSATE SODIUM) 1 by mouth daily      Allergies Added: ! * DILAUDID  Visit Type:  Follow-up Primary Provider:  Dr. Ardeen Garland  CC:  CAD.  History of Present Illness: The Carollee Herter presents for followup after recent hospitalization and catheterization for chest pain. She ruled out for myocardial infarction. Cardiac catheterization as described below. She had residual coronary disease was managed medically. Since that time she has done well. She has had no further chest discomfort, neck or arm discomfort. She has had no palpitations, presyncope or syncope. She denies any shortness of breath, PND or orthopnea.  Current Medications (verified): 1)  Hydrocodone-Acetaminophen 7.5-750 Mg Tabs (Hydrocodone-Acetaminophen) .... 2-4 Tabs As Needed 2)  Aspirin 81 Mg  Tabs (Aspirin) .Marland Kitchen.. 1 Podaily 3)  Omeprazole 20 Mg Cpdr (Omeprazole) .Marland Kitchen.. 1 By Mouth Daily 4)  Klor-Con M20 20 Meq Cr-Tabs (Potassium Chloride Crys Cr) .Marland Kitchen.. 1 By Mouth Daily 5)  Alprazolam 0.25 Mg Tabs (Alprazolam) .... As Directed 6)  Coreg 3.125 Mg Tabs (Carvedilol) .... 3 By Mouth Two Times A Day 7)  Warfarin Sodium 3 Mg Tabs (Warfarin Sodium) .... As Directed 8)  Simvastatin 20 Mg Tabs (Simvastatin) .Marland Kitchen.. 1 P Odaily 9)  Enalapril Maleate 5 Mg Tabs (Enalapril Maleate) .Marland Kitchen.. 1 By Mouth Two Times A Day 10)  Lidoderm 5 % Ptch (Lidocaine) .... As Direted 11)  Furosemide 40 Mg Tabs (Furosemide) .Marland Kitchen.. 1 By Mouth Daily 12)  Acetaminophen 325 Mg  Tabs (Acetaminophen) .... As Needed 13)  Nitrostat 0.4 Mg Subl (Nitroglycerin) .... As Needed 14)  Colace 100 Mg Caps (Docusate Sodium) .Marland Kitchen.. 1 By Mouth Daily  Allergies (verified): 1)  ! Heparin 2)  ! * Altace 3)  ! * Ivp Dye 4)  ! * Dilaudid  Past  History:  Past Medical History:  1. Coronary artery disease (status post LAD stenting in 1999.  CABG in       2004 with a LIMA to the LAD and sequential SVG to PDA.  Cath 02/2009  95% LAD       with a patent LIMA to LAD. Ramus intermedius had a 40% stenosis.  The RCA        was occluded, but the SVG  to PDA was widely patent and her EF was 65%.)  2. Hypertension.   3. Hyperlipidemia.   4. Diabetes mellitus (the patient reports that she does not have this       and is not on therapy)  5. Bilateral renal artery stenosis, status post stenting.   6. Carotid artery disease, status post right carotid endarterectomy.   7. Degenerative joint disease.   8. Pseudoaneurysm, status post repair following catheterization in       1999.   9. Contrast allergy.   10.Mild mitral regurgitation.   11.Mild renal insufficiency.   12.Restless leg syndrome.      Past Surgical History: Reviewed history from 08/09/2009 and no changes required.  1. CABG in 2004.   2. Right carotid endarterectomy.   3. Laparoscopic cholecystectomy in 2010.   4. Repair of pseudoaneurysm after cardiac catheterization.   5. Repair of right rotator cuff.   Review of Systems       As stated in the HPI and negative for all other systems.  Vital Signs:  Patient profile:   77 year old female Height:      64 inches Weight:      116 pounds BMI:     19.98 Pulse rate:   55 / minute Resp:     16 per minute BP sitting:   124 / 66  (right arm)  Vitals Entered By: Marrion Coy, CNA (March 22, 2010 12:58 PM)  Physical Exam  General:  Well developed, well nourished, in no acute distress. Head:  normocephalic and atraumatic Eyes:  PERRLA/EOM intact; conjunctiva and lids normal. Neck:  Neck supple, no JVD. No masses, thyromegaly or abnormal cervical nodes, left bruit. Chest Wall:  Well healed sternotomy scar Lungs:  Clear bilaterally to auscultation and percussion. Heart:  Non-displaced PMI, chest non-tender; regular rate  and rhythm, S1, S2 without murmurs, rubs or gallops. Carotid upstroke normal. . Normal abdominal aortic size, no bruits. Femorals normal pulses, no bruits. Pedals normal pulses. No edema, no varicosities. Abdomen:  Bowel sounds positive; abdomen soft and non-tender without masses, organomegaly, or hernias noted. No hepatosplenomegaly. Msk:  Back normal, normal gait. Muscle strength and tone normal. Extremities:  No clubbing or cyanosis. Neurologic:  Alert and oriented x 3. Skin:  Intact without lesions or rashes. Psych:  Normal affect.   EKG  Procedure date:  03/22/2010  Findings:      Sinus bradycardia, premature atrial contractions, axis within normal limits, intervals within normal limits, no acute ST-T wave changes  Impression & Recommendations:  Problem # 1:  CAD (ICD-414.00) She is having no new symptoms. No further cardiovascular testing is suggested. She will continue with risk reduction. Orders: EKG w/ Interpretation (93000)  Problem # 2:  RENAL INSUFFICIENCY (ICD-588.9) This was reassessed with a basic metabolic profile after discharge and her creatinine was near normal  Problem # 3:  ATRIAL FIBRILLATION (ICD-427.31) The patient tolerates rate control and anticoagulation. Orders: EKG w/ Interpretation (93000)  Problem # 4:  HYPERTENSION (ICD-401.9) Her blood pressure is well controlled and she will continue on the meds as listed.  Patient Instructions: 1)  Your physician recommends that you schedule a follow-up appointment in: 4 months in the [facility] office  December  7 , 2011 at 1pm. 2)  Your physician recommends that you continue on your current medications as directed. Please refer to the Current Medication list given to you today.

## 2010-09-14 NOTE — Consult Note (Signed)
Summary: [facility]  [facility]   Imported By: Roderic Ovens 08/10/2010 12:14:40  _____________________________________________________________________  External Attachment:    Type:   Image     Comment:   External Document

## 2010-10-23 LAB — CBC
Platelets: 258 10*3/uL (ref 150–400)
RBC: 4.28 MIL/uL (ref 3.87–5.11)
WBC: 12.9 10*3/uL — ABNORMAL HIGH (ref 4.0–10.5)

## 2010-10-23 LAB — URINALYSIS, ROUTINE W REFLEX MICROSCOPIC
Glucose, UA: NEGATIVE mg/dL
Hgb urine dipstick: NEGATIVE
Protein, ur: NEGATIVE mg/dL
pH: 6.5 (ref 5.0–8.0)

## 2010-10-23 LAB — DIFFERENTIAL
Lymphocytes Relative: 14 % (ref 12–46)
Lymphs Abs: 1.8 10*3/uL (ref 0.7–4.0)
Neutro Abs: 9.7 10*3/uL — ABNORMAL HIGH (ref 1.7–7.7)
Neutrophils Relative %: 75 % (ref 43–77)

## 2010-10-23 LAB — BASIC METABOLIC PANEL
Calcium: 9.7 mg/dL (ref 8.4–10.5)
Chloride: 106 mEq/L (ref 96–112)
Creatinine, Ser: 1.3 mg/dL — ABNORMAL HIGH (ref 0.4–1.2)
GFR calc Af Amer: 47 mL/min — ABNORMAL LOW (ref >60)

## 2010-10-23 LAB — HEPATIC FUNCTION PANEL
ALT: 12 U/L (ref 0–35)
Albumin: 4.2 g/dL (ref 3.5–5.2)
Alkaline Phosphatase: 57 U/L (ref 39–117)
Total Protein: 7.5 g/dL (ref 6.0–8.3)

## 2010-10-23 LAB — POCT CARDIAC MARKERS
CKMB, poc: <1 ng/mL — ABNORMAL LOW (ref 1.0–8.0)
CKMB, poc: <1 ng/mL — ABNORMAL LOW (ref 1.0–8.0)
Myoglobin, poc: 55.4 ng/mL (ref 12–200)
Myoglobin, poc: 76.6 ng/mL (ref 12–200)
Troponin i, poc: 0.06 ng/mL (ref 0.00–0.09)

## 2010-10-23 LAB — PROTIME-INR: Prothrombin Time: 20.8 seconds — ABNORMAL HIGH (ref 11.6–15.2)

## 2010-10-28 LAB — URINALYSIS, ROUTINE W REFLEX MICROSCOPIC
Glucose, UA: NEGATIVE mg/dL
Hgb urine dipstick: NEGATIVE
Specific Gravity, Urine: 1.007 (ref 1.005–1.030)

## 2010-10-28 LAB — BASIC METABOLIC PANEL
CO2: 24 mEq/L (ref 19–32)
Chloride: 104 mEq/L (ref 96–112)
GFR calc Af Amer: 51 mL/min — ABNORMAL LOW (ref >60)
Potassium: 4.6 mEq/L (ref 3.5–5.1)

## 2010-10-28 LAB — CBC
HCT: 36.4 % (ref 36.0–46.0)
HCT: 38.1 % (ref 36.0–46.0)
MCHC: 34.2 g/dL (ref 30.0–36.0)
Platelets: 247 10*3/uL (ref 150–400)
Platelets: 260 10*3/uL (ref 150–400)
Platelets: 262 10*3/uL (ref 150–400)
RDW: 14.7 % (ref 11.5–15.5)
RDW: 14.7 % (ref 11.5–15.5)
RDW: 15 % (ref 11.5–15.5)
WBC: 10.7 10*3/uL — ABNORMAL HIGH (ref 4.0–10.5)
WBC: 9.5 10*3/uL (ref 4.0–10.5)
WBC: 9.7 10*3/uL (ref 4.0–10.5)

## 2010-10-28 LAB — GLUCOSE, CAPILLARY: Glucose-Capillary: 121 mg/dL — ABNORMAL HIGH (ref 70–99)

## 2010-10-28 LAB — APTT
aPTT: 31 seconds (ref 24–37)
aPTT: 49 seconds — ABNORMAL HIGH (ref 24–37)

## 2010-10-28 LAB — COMPREHENSIVE METABOLIC PANEL
ALT: 11 U/L (ref 0–35)
ALT: 14 U/L (ref 0–35)
AST: 23 U/L (ref 0–37)
Albumin: 3.6 g/dL (ref 3.5–5.2)
Albumin: 3.6 g/dL (ref 3.5–5.2)
Alkaline Phosphatase: 74 U/L (ref 39–117)
BUN: 17 mg/dL (ref 6–23)
Calcium: 9.1 mg/dL (ref 8.4–10.5)
GFR calc Af Amer: 54 mL/min — ABNORMAL LOW (ref >60)
Glucose, Bld: 128 mg/dL — ABNORMAL HIGH (ref 70–99)
Glucose, Bld: 197 mg/dL — ABNORMAL HIGH (ref 70–99)
Potassium: 3.9 mEq/L (ref 3.5–5.1)
Sodium: 136 mEq/L (ref 135–145)
Sodium: 140 mEq/L (ref 135–145)
Total Protein: 6.7 g/dL (ref 6.0–8.3)
Total Protein: 7.1 g/dL (ref 6.0–8.3)

## 2010-10-28 LAB — DIFFERENTIAL
Basophils Relative: 1 % (ref 0–1)
Eosinophils Absolute: 0.3 10*3/uL (ref 0.0–0.7)
Eosinophils Relative: 3 % (ref 0–5)
Lymphs Abs: 2.3 10*3/uL (ref 0.7–4.0)
Monocytes Absolute: 0.8 10*3/uL (ref 0.1–1.0)
Monocytes Relative: 9 % (ref 3–12)

## 2010-10-28 LAB — URINE CULTURE

## 2010-10-28 LAB — CARDIAC PANEL(CRET KIN+CKTOT+MB+TROPI)
CK, MB: 1.5 ng/mL (ref 0.3–4.0)
CK, MB: 4 ng/mL (ref 0.3–4.0)
Relative Index: INVALID (ref 0.0–2.5)
Relative Index: INVALID (ref 0.0–2.5)
Total CK: 47 U/L (ref 7–177)
Troponin I: 0.3 ng/mL — ABNORMAL HIGH (ref 0.00–0.06)

## 2010-10-28 LAB — MRSA PCR SCREENING
MRSA by PCR: NEGATIVE
MRSA by PCR: NEGATIVE

## 2010-10-28 LAB — URINE MICROSCOPIC-ADD ON

## 2010-10-28 LAB — POCT CARDIAC MARKERS
CKMB, poc: <1 ng/mL — ABNORMAL LOW (ref 1.0–8.0)
Myoglobin, poc: 45.2 ng/mL (ref 12–200)
Troponin i, poc: <0.05 ng/mL (ref 0.00–0.09)

## 2010-10-28 LAB — LIPID PANEL: Cholesterol: 123 mg/dL (ref 0–200)

## 2010-10-28 LAB — HEMOGLOBIN A1C: Mean Plasma Glucose: 128 mg/dL — ABNORMAL HIGH (ref <117)

## 2010-10-29 LAB — PROTIME-INR
INR: 1.26 (ref 0.00–1.49)
INR: 1.33 (ref 0.00–1.49)
INR: 1.75 — ABNORMAL HIGH (ref 0.00–1.49)
Prothrombin Time: 15.7 seconds — ABNORMAL HIGH (ref 11.6–15.2)
Prothrombin Time: 16.4 seconds — ABNORMAL HIGH (ref 11.6–15.2)

## 2010-10-29 LAB — GLUCOSE, CAPILLARY
Glucose-Capillary: 112 mg/dL — ABNORMAL HIGH (ref 70–99)
Glucose-Capillary: 119 mg/dL — ABNORMAL HIGH (ref 70–99)
Glucose-Capillary: 121 mg/dL — ABNORMAL HIGH (ref 70–99)
Glucose-Capillary: 122 mg/dL — ABNORMAL HIGH (ref 70–99)
Glucose-Capillary: 127 mg/dL — ABNORMAL HIGH (ref 70–99)
Glucose-Capillary: 137 mg/dL — ABNORMAL HIGH (ref 70–99)
Glucose-Capillary: 141 mg/dL — ABNORMAL HIGH (ref 70–99)
Glucose-Capillary: 150 mg/dL — ABNORMAL HIGH (ref 70–99)
Glucose-Capillary: 96 mg/dL (ref 70–99)

## 2010-10-29 LAB — CBC
HCT: 33.9 % — ABNORMAL LOW (ref 36.0–46.0)
MCHC: 33.3 g/dL (ref 30.0–36.0)
MCV: 90.5 fL (ref 78.0–100.0)
Platelets: 213 10*3/uL (ref 150–400)
WBC: 7.8 10*3/uL (ref 4.0–10.5)

## 2010-10-29 LAB — BASIC METABOLIC PANEL
BUN: 22 mg/dL (ref 6–23)
CO2: 28 mEq/L (ref 19–32)
Chloride: 103 mEq/L (ref 96–112)
Potassium: 4.1 mEq/L (ref 3.5–5.1)

## 2010-10-30 LAB — POCT CARDIAC MARKERS
CKMB, poc: <1 ng/mL — ABNORMAL LOW (ref 1.0–8.0)
Myoglobin, poc: 77.8 ng/mL (ref 12–200)

## 2010-10-30 LAB — URINE MICROSCOPIC-ADD ON

## 2010-10-30 LAB — BASIC METABOLIC PANEL
BUN: 21 mg/dL (ref 6–23)
CO2: 27 mEq/L (ref 19–32)
CO2: 28 mEq/L (ref 19–32)
Calcium: 8.8 mg/dL (ref 8.4–10.5)
Calcium: 9.4 mg/dL (ref 8.4–10.5)
Chloride: 107 mEq/L (ref 96–112)
Creatinine, Ser: 1.22 mg/dL — ABNORMAL HIGH (ref 0.4–1.2)
GFR calc non Af Amer: 42 mL/min — ABNORMAL LOW (ref >60)
GFR calc non Af Amer: 53 mL/min — ABNORMAL LOW (ref >60)
GFR calc non Af Amer: 53 mL/min — ABNORMAL LOW (ref >60)
Glucose, Bld: 103 mg/dL — ABNORMAL HIGH (ref 70–99)
Glucose, Bld: 125 mg/dL — ABNORMAL HIGH (ref 70–99)
Glucose, Bld: 127 mg/dL — ABNORMAL HIGH (ref 70–99)
Glucose, Bld: 94 mg/dL (ref 70–99)
Potassium: 4.1 mEq/L (ref 3.5–5.1)
Potassium: 4.3 mEq/L (ref 3.5–5.1)
Sodium: 137 mEq/L (ref 135–145)
Sodium: 138 mEq/L (ref 135–145)
Sodium: 140 mEq/L (ref 135–145)

## 2010-10-30 LAB — AMYLASE: Amylase: 82 U/L (ref 0–105)

## 2010-10-30 LAB — DIFFERENTIAL
Basophils Absolute: 0 10*3/uL (ref 0.0–0.1)
Basophils Relative: 0 % (ref 0–1)
Lymphocytes Relative: 5 % — ABNORMAL LOW (ref 12–46)
Neutro Abs: 10.4 10*3/uL — ABNORMAL HIGH (ref 1.7–7.7)
Neutrophils Relative %: 85 % — ABNORMAL HIGH (ref 43–77)

## 2010-10-30 LAB — HEPATIC FUNCTION PANEL
AST: 27 U/L (ref 0–37)
Albumin: 3.1 g/dL — ABNORMAL LOW (ref 3.5–5.2)

## 2010-10-30 LAB — RAPID URINE DRUG SCREEN, HOSP PERFORMED
Amphetamines: NOT DETECTED
Barbiturates: NOT DETECTED
Benzodiazepines: NOT DETECTED
Cocaine: NOT DETECTED

## 2010-10-30 LAB — CARDIAC PANEL(CRET KIN+CKTOT+MB+TROPI)
CK, MB: 2.3 ng/mL (ref 0.3–4.0)
Relative Index: INVALID (ref 0.0–2.5)
Total CK: 19 U/L (ref 7–177)
Total CK: 19 U/L (ref 7–177)
Total CK: 26 U/L (ref 7–177)

## 2010-10-30 LAB — URINALYSIS, ROUTINE W REFLEX MICROSCOPIC
Bilirubin Urine: NEGATIVE
Glucose, UA: NEGATIVE mg/dL
Leukocytes, UA: NEGATIVE
Nitrite: POSITIVE — AB
Specific Gravity, Urine: 1.009 (ref 1.005–1.030)
pH: 7 (ref 5.0–8.0)

## 2010-10-30 LAB — CBC
HCT: 35.2 % — ABNORMAL LOW (ref 36.0–46.0)
Hemoglobin: 11.4 g/dL — ABNORMAL LOW (ref 12.0–15.0)
MCHC: 32.3 g/dL (ref 30.0–36.0)
MCHC: 33.1 g/dL (ref 30.0–36.0)
Platelets: 231 10*3/uL (ref 150–400)
Platelets: 274 10*3/uL (ref 150–400)
RDW: 15 % (ref 11.5–15.5)
RDW: 15.4 % (ref 11.5–15.5)
RDW: 15.9 % — ABNORMAL HIGH (ref 11.5–15.5)

## 2010-10-30 LAB — LIPID PANEL
Cholesterol: 169 mg/dL (ref 0–200)
Total CHOL/HDL Ratio: 4.8 RATIO
VLDL: 24 mg/dL (ref 0–40)

## 2010-11-01 LAB — PROTIME-INR
INR: 2.01 — ABNORMAL HIGH (ref 0.00–1.49)
INR: 2.1 — ABNORMAL HIGH (ref 0.00–1.49)
Prothrombin Time: 23.4 seconds — ABNORMAL HIGH (ref 11.6–15.2)

## 2010-11-01 LAB — BASIC METABOLIC PANEL
BUN: 10 mg/dL (ref 6–23)
BUN: 8 mg/dL (ref 6–23)
Calcium: 8.1 mg/dL — ABNORMAL LOW (ref 8.4–10.5)
Chloride: 93 mEq/L — ABNORMAL LOW (ref 96–112)
Creatinine, Ser: 0.87 mg/dL (ref 0.4–1.2)
GFR calc non Af Amer: 53 mL/min — ABNORMAL LOW (ref >60)
GFR calc non Af Amer: >60 mL/min (ref >60)
Glucose, Bld: 111 mg/dL — ABNORMAL HIGH (ref 70–99)
Potassium: 2.8 mEq/L — ABNORMAL LOW (ref 3.5–5.1)
Potassium: 4.2 mEq/L (ref 3.5–5.1)
Sodium: 135 mEq/L (ref 135–145)
Sodium: 138 mEq/L (ref 135–145)

## 2010-11-01 LAB — GLUCOSE, CAPILLARY
Glucose-Capillary: 101 mg/dL — ABNORMAL HIGH (ref 70–99)
Glucose-Capillary: 102 mg/dL — ABNORMAL HIGH (ref 70–99)
Glucose-Capillary: 112 mg/dL — ABNORMAL HIGH (ref 70–99)
Glucose-Capillary: 123 mg/dL — ABNORMAL HIGH (ref 70–99)
Glucose-Capillary: 125 mg/dL — ABNORMAL HIGH (ref 70–99)
Glucose-Capillary: 126 mg/dL — ABNORMAL HIGH (ref 70–99)
Glucose-Capillary: 132 mg/dL — ABNORMAL HIGH (ref 70–99)

## 2010-11-01 LAB — URINE MICROSCOPIC-ADD ON

## 2010-11-01 LAB — TROPONIN I: Troponin I: 0.15 ng/mL — ABNORMAL HIGH (ref 0.00–0.06)

## 2010-11-01 LAB — URINE CULTURE: Colony Count: >=100000

## 2010-11-01 LAB — COMPREHENSIVE METABOLIC PANEL
ALT: 37 U/L — ABNORMAL HIGH (ref 0–35)
AST: 29 U/L (ref 0–37)
Albumin: 3.1 g/dL — ABNORMAL LOW (ref 3.5–5.2)
Alkaline Phosphatase: 76 U/L (ref 39–117)
BUN: 16 mg/dL (ref 6–23)
Chloride: 92 mEq/L — ABNORMAL LOW (ref 96–112)
Potassium: 3 mEq/L — ABNORMAL LOW (ref 3.5–5.1)
Sodium: 132 mEq/L — ABNORMAL LOW (ref 135–145)
Total Bilirubin: 1.2 mg/dL (ref 0.3–1.2)
Total Protein: 7.3 g/dL (ref 6.0–8.3)

## 2010-11-01 LAB — CBC
HCT: 41 % (ref 36.0–46.0)
Platelets: 147 10*3/uL — ABNORMAL LOW (ref 150–400)
Platelets: 181 10*3/uL (ref 150–400)
RDW: 17.3 % — ABNORMAL HIGH (ref 11.5–15.5)
WBC: 12.6 10*3/uL — ABNORMAL HIGH (ref 4.0–10.5)

## 2010-11-01 LAB — POCT CARDIAC MARKERS
CKMB, poc: 1.8 ng/mL (ref 1.0–8.0)
Troponin i, poc: <0.05 ng/mL (ref 0.00–0.09)

## 2010-11-01 LAB — URINALYSIS, ROUTINE W REFLEX MICROSCOPIC
Ketones, ur: NEGATIVE mg/dL
Nitrite: NEGATIVE
Specific Gravity, Urine: 1.012 (ref 1.005–1.030)
Urobilinogen, UA: 1 mg/dL (ref 0.0–1.0)
pH: 6.5 (ref 5.0–8.0)

## 2010-11-01 LAB — DIFFERENTIAL
Basophils Absolute: 0 10*3/uL (ref 0.0–0.1)
Basophils Relative: 0 % (ref 0–1)
Eosinophils Absolute: 0 10*3/uL (ref 0.0–0.7)
Eosinophils Relative: 0 % (ref 0–5)
Monocytes Absolute: 0.9 10*3/uL (ref 0.1–1.0)
Monocytes Relative: 7 % (ref 3–12)
Neutro Abs: 10.5 10*3/uL — ABNORMAL HIGH (ref 1.7–7.7)

## 2010-11-01 LAB — CARDIAC PANEL(CRET KIN+CKTOT+MB+TROPI)
CK, MB: 1.2 ng/mL (ref 0.3–4.0)
CK, MB: 1.4 ng/mL (ref 0.3–4.0)
Troponin I: 0.11 ng/mL — ABNORMAL HIGH (ref 0.00–0.06)
Troponin I: 0.13 ng/mL — ABNORMAL HIGH (ref 0.00–0.06)

## 2010-11-01 LAB — CK TOTAL AND CKMB (NOT AT ARMC)
CK, MB: 1.7 ng/mL (ref 0.3–4.0)
Relative Index: INVALID (ref 0.0–2.5)
Total CK: 29 U/L (ref 7–177)

## 2010-11-02 LAB — BASIC METABOLIC PANEL
BUN: 16 mg/dL (ref 6–23)
BUN: 22 mg/dL (ref 6–23)
BUN: 23 mg/dL (ref 6–23)
BUN: 32 mg/dL — ABNORMAL HIGH (ref 6–23)
CO2: 18 mEq/L — ABNORMAL LOW (ref 19–32)
CO2: 21 mEq/L (ref 19–32)
CO2: 24 mEq/L (ref 19–32)
Calcium: 8.1 mg/dL — ABNORMAL LOW (ref 8.4–10.5)
Calcium: 8.3 mg/dL — ABNORMAL LOW (ref 8.4–10.5)
Calcium: 8.3 mg/dL — ABNORMAL LOW (ref 8.4–10.5)
Calcium: 8.3 mg/dL — ABNORMAL LOW (ref 8.4–10.5)
Calcium: 8.5 mg/dL (ref 8.4–10.5)
Calcium: 8.5 mg/dL (ref 8.4–10.5)
Chloride: 106 mEq/L (ref 96–112)
Chloride: 111 mEq/L (ref 96–112)
Chloride: 113 mEq/L — ABNORMAL HIGH (ref 96–112)
Creatinine, Ser: 1.07 mg/dL (ref 0.4–1.2)
Creatinine, Ser: 1.12 mg/dL (ref 0.4–1.2)
Creatinine, Ser: 1.26 mg/dL — ABNORMAL HIGH (ref 0.4–1.2)
Creatinine, Ser: 1.36 mg/dL — ABNORMAL HIGH (ref 0.4–1.2)
GFR calc Af Amer: 42 mL/min — ABNORMAL LOW (ref >60)
GFR calc Af Amer: 42 mL/min — ABNORMAL LOW (ref >60)
GFR calc Af Amer: 45 mL/min — ABNORMAL LOW (ref >60)
GFR calc Af Amer: 49 mL/min — ABNORMAL LOW (ref >60)
GFR calc Af Amer: 54 mL/min — ABNORMAL LOW (ref >60)
GFR calc Af Amer: 56 mL/min — ABNORMAL LOW (ref >60)
GFR calc Af Amer: 59 mL/min — ABNORMAL LOW (ref >60)
GFR calc non Af Amer: 34 mL/min — ABNORMAL LOW (ref >60)
GFR calc non Af Amer: 35 mL/min — ABNORMAL LOW (ref >60)
GFR calc non Af Amer: 37 mL/min — ABNORMAL LOW (ref >60)
GFR calc non Af Amer: 41 mL/min — ABNORMAL LOW (ref >60)
GFR calc non Af Amer: 45 mL/min — ABNORMAL LOW (ref >60)
GFR calc non Af Amer: 46 mL/min — ABNORMAL LOW (ref >60)
GFR calc non Af Amer: 48 mL/min — ABNORMAL LOW (ref >60)
Glucose, Bld: 103 mg/dL — ABNORMAL HIGH (ref 70–99)
Glucose, Bld: 138 mg/dL — ABNORMAL HIGH (ref 70–99)
Potassium: 3.4 mEq/L — ABNORMAL LOW (ref 3.5–5.1)
Potassium: 3.7 mEq/L (ref 3.5–5.1)
Potassium: 4.3 mEq/L (ref 3.5–5.1)
Potassium: 4.3 mEq/L (ref 3.5–5.1)
Potassium: 4.9 mEq/L (ref 3.5–5.1)
Potassium: 5.5 mEq/L — ABNORMAL HIGH (ref 3.5–5.1)
Sodium: 133 mEq/L — ABNORMAL LOW (ref 135–145)
Sodium: 136 mEq/L (ref 135–145)
Sodium: 136 mEq/L (ref 135–145)
Sodium: 137 mEq/L (ref 135–145)
Sodium: 137 mEq/L (ref 135–145)
Sodium: 138 mEq/L (ref 135–145)
Sodium: 138 mEq/L (ref 135–145)
Sodium: 140 mEq/L (ref 135–145)

## 2010-11-02 LAB — DIFFERENTIAL
Basophils Absolute: 0 K/uL (ref 0.0–0.1)
Basophils Relative: 0 % (ref 0–1)
Eosinophils Absolute: 0.2 K/uL (ref 0.0–0.7)
Eosinophils Relative: 3 % (ref 0–5)
Lymphocytes Relative: 33 % (ref 12–46)
Lymphs Abs: 2.1 K/uL (ref 0.7–4.0)
Monocytes Absolute: 0.4 10*3/uL (ref 0.1–1.0)
Monocytes Relative: 7 % (ref 3–12)
Neutro Abs: 3.6 10*3/uL (ref 1.7–7.7)
Neutrophils Relative %: 57 % (ref 43–77)

## 2010-11-02 LAB — CARDIAC PANEL(CRET KIN+CKTOT+MB+TROPI)
CK, MB: 1 ng/mL (ref 0.3–4.0)
CK, MB: 1.2 ng/mL (ref 0.3–4.0)
CK, MB: 1.4 ng/mL (ref 0.3–4.0)
CK, MB: 1.5 ng/mL (ref 0.3–4.0)
CK, MB: 3.3 ng/mL (ref 0.3–4.0)
Relative Index: INVALID (ref 0.0–2.5)
Relative Index: INVALID (ref 0.0–2.5)
Relative Index: INVALID (ref 0.0–2.5)
Relative Index: INVALID (ref 0.0–2.5)
Total CK: 16 U/L (ref 7–177)
Total CK: 17 U/L (ref 7–177)
Total CK: 20 U/L (ref 7–177)
Total CK: 34 U/L (ref 7–177)
Troponin I: 0.05 ng/mL (ref 0.00–0.06)
Troponin I: 0.05 ng/mL (ref 0.00–0.06)
Troponin I: 0.05 ng/mL (ref 0.00–0.06)
Troponin I: 0.06 ng/mL (ref 0.00–0.06)

## 2010-11-02 LAB — GLUCOSE, CAPILLARY
Glucose-Capillary: 100 mg/dL — ABNORMAL HIGH (ref 70–99)
Glucose-Capillary: 100 mg/dL — ABNORMAL HIGH (ref 70–99)
Glucose-Capillary: 102 mg/dL — ABNORMAL HIGH (ref 70–99)
Glucose-Capillary: 104 mg/dL — ABNORMAL HIGH (ref 70–99)
Glucose-Capillary: 105 mg/dL — ABNORMAL HIGH (ref 70–99)
Glucose-Capillary: 108 mg/dL — ABNORMAL HIGH (ref 70–99)
Glucose-Capillary: 108 mg/dL — ABNORMAL HIGH (ref 70–99)
Glucose-Capillary: 111 mg/dL — ABNORMAL HIGH (ref 70–99)
Glucose-Capillary: 112 mg/dL — ABNORMAL HIGH (ref 70–99)
Glucose-Capillary: 113 mg/dL — ABNORMAL HIGH (ref 70–99)
Glucose-Capillary: 114 mg/dL — ABNORMAL HIGH (ref 70–99)
Glucose-Capillary: 115 mg/dL — ABNORMAL HIGH (ref 70–99)
Glucose-Capillary: 116 mg/dL — ABNORMAL HIGH (ref 70–99)
Glucose-Capillary: 119 mg/dL — ABNORMAL HIGH (ref 70–99)
Glucose-Capillary: 119 mg/dL — ABNORMAL HIGH (ref 70–99)
Glucose-Capillary: 121 mg/dL — ABNORMAL HIGH (ref 70–99)
Glucose-Capillary: 121 mg/dL — ABNORMAL HIGH (ref 70–99)
Glucose-Capillary: 125 mg/dL — ABNORMAL HIGH (ref 70–99)
Glucose-Capillary: 126 mg/dL — ABNORMAL HIGH (ref 70–99)
Glucose-Capillary: 137 mg/dL — ABNORMAL HIGH (ref 70–99)
Glucose-Capillary: 139 mg/dL — ABNORMAL HIGH (ref 70–99)
Glucose-Capillary: 139 mg/dL — ABNORMAL HIGH (ref 70–99)
Glucose-Capillary: 142 mg/dL — ABNORMAL HIGH (ref 70–99)
Glucose-Capillary: 144 mg/dL — ABNORMAL HIGH (ref 70–99)
Glucose-Capillary: 147 mg/dL — ABNORMAL HIGH (ref 70–99)
Glucose-Capillary: 148 mg/dL — ABNORMAL HIGH (ref 70–99)
Glucose-Capillary: 149 mg/dL — ABNORMAL HIGH (ref 70–99)
Glucose-Capillary: 150 mg/dL — ABNORMAL HIGH (ref 70–99)
Glucose-Capillary: 217 mg/dL — ABNORMAL HIGH (ref 70–99)
Glucose-Capillary: 73 mg/dL (ref 70–99)
Glucose-Capillary: 77 mg/dL (ref 70–99)
Glucose-Capillary: 77 mg/dL (ref 70–99)
Glucose-Capillary: 80 mg/dL (ref 70–99)
Glucose-Capillary: 90 mg/dL (ref 70–99)
Glucose-Capillary: 92 mg/dL (ref 70–99)
Glucose-Capillary: 93 mg/dL (ref 70–99)
Glucose-Capillary: 93 mg/dL (ref 70–99)
Glucose-Capillary: 96 mg/dL (ref 70–99)
Glucose-Capillary: 97 mg/dL (ref 70–99)
Glucose-Capillary: 97 mg/dL (ref 70–99)
Glucose-Capillary: 98 mg/dL (ref 70–99)
Glucose-Capillary: 99 mg/dL (ref 70–99)

## 2010-11-02 LAB — CBC
HCT: 37.4 % (ref 36.0–46.0)
HCT: 37.9 % (ref 36.0–46.0)
HCT: 38.8 % (ref 36.0–46.0)
HCT: 40.7 % (ref 36.0–46.0)
Hemoglobin: 12.4 g/dL (ref 12.0–15.0)
Hemoglobin: 12.5 g/dL (ref 12.0–15.0)
Hemoglobin: 13 g/dL (ref 12.0–15.0)
Hemoglobin: 13.4 g/dL (ref 12.0–15.0)
MCHC: 32.3 g/dL (ref 30.0–36.0)
MCHC: 33 g/dL (ref 30.0–36.0)
MCV: 87.8 fL (ref 78.0–100.0)
MCV: 88.8 fL (ref 78.0–100.0)
Platelets: 214 K/uL (ref 150–400)
Platelets: 218 10*3/uL (ref 150–400)
RBC: 4.31 MIL/uL (ref 3.87–5.11)
RBC: 4.58 MIL/uL (ref 3.87–5.11)
RBC: 4.69 MIL/uL (ref 3.87–5.11)
RDW: 17.1 % — ABNORMAL HIGH (ref 11.5–15.5)
RDW: 17.3 % — ABNORMAL HIGH (ref 11.5–15.5)
RDW: 18.2 % — ABNORMAL HIGH (ref 11.5–15.5)
WBC: 5.7 10*3/uL (ref 4.0–10.5)
WBC: 6.2 10*3/uL (ref 4.0–10.5)
WBC: 6.3 K/uL (ref 4.0–10.5)
WBC: 9.1 10*3/uL (ref 4.0–10.5)

## 2010-11-02 LAB — PROTIME-INR
INR: 1.94 — ABNORMAL HIGH (ref 0.00–1.49)
INR: 2.23 — ABNORMAL HIGH (ref 0.00–1.49)
INR: 2.73 — ABNORMAL HIGH (ref 0.00–1.49)
INR: 2.84 — ABNORMAL HIGH (ref 0.00–1.49)
INR: 3.56 — ABNORMAL HIGH (ref 0.00–1.49)
INR: 3.88 — ABNORMAL HIGH (ref 0.00–1.49)
INR: 3.92 — ABNORMAL HIGH (ref 0.00–1.49)
Prothrombin Time: 22 seconds — ABNORMAL HIGH (ref 11.6–15.2)
Prothrombin Time: 22.8 seconds — ABNORMAL HIGH (ref 11.6–15.2)
Prothrombin Time: 24.5 seconds — ABNORMAL HIGH (ref 11.6–15.2)
Prothrombin Time: 29.6 s — ABNORMAL HIGH (ref 11.6–15.2)
Prothrombin Time: 37.8 seconds — ABNORMAL HIGH (ref 11.6–15.2)
Prothrombin Time: 45.5 seconds — ABNORMAL HIGH (ref 11.6–15.2)

## 2010-11-02 LAB — MRSA PCR SCREENING: MRSA by PCR: NEGATIVE

## 2010-11-02 LAB — HEPATIC FUNCTION PANEL
AST: 194 U/L — ABNORMAL HIGH (ref 0–37)
Albumin: 2.7 g/dL — ABNORMAL LOW (ref 3.5–5.2)
Alkaline Phosphatase: 106 U/L (ref 39–117)
Total Bilirubin: 1.3 mg/dL — ABNORMAL HIGH (ref 0.3–1.2)

## 2010-11-02 LAB — POCT I-STAT, CHEM 8
BUN: 32 mg/dL — ABNORMAL HIGH (ref 6–23)
Calcium, Ion: 1.05 mmol/L — ABNORMAL LOW (ref 1.12–1.32)
Chloride: 108 mEq/L (ref 96–112)
Creatinine, Ser: 1.4 mg/dL — ABNORMAL HIGH (ref 0.4–1.2)
Glucose, Bld: 110 mg/dL — ABNORMAL HIGH (ref 70–99)
HCT: 38 % (ref 36.0–46.0)
Hemoglobin: 12.9 g/dL (ref 12.0–15.0)
Potassium: 4.2 mEq/L (ref 3.5–5.1)
Sodium: 135 meq/L (ref 135–145)
TCO2: 22 mmol/L (ref 0–100)

## 2010-11-02 LAB — STOOL CULTURE

## 2010-11-02 LAB — URINALYSIS, ROUTINE W REFLEX MICROSCOPIC
Bilirubin Urine: NEGATIVE
Ketones, ur: NEGATIVE mg/dL
Nitrite: NEGATIVE
Protein, ur: 100 mg/dL — AB
Urobilinogen, UA: 0.2 mg/dL (ref 0.0–1.0)

## 2010-11-02 LAB — BASIC METABOLIC PANEL WITH GFR
BUN: 30 mg/dL — ABNORMAL HIGH (ref 6–23)
Calcium: 8.5 mg/dL (ref 8.4–10.5)
GFR calc non Af Amer: 37 mL/min — ABNORMAL LOW (ref >60)
Glucose, Bld: 120 mg/dL — ABNORMAL HIGH (ref 70–99)

## 2010-11-02 LAB — URINE MICROSCOPIC-ADD ON

## 2010-11-02 LAB — POCT CARDIAC MARKERS
CKMB, poc: <1 ng/mL — ABNORMAL LOW (ref 1.0–8.0)
Myoglobin, poc: 55.5 ng/mL (ref 12–200)
Troponin i, poc: <0.05 ng/mL (ref 0.00–0.09)

## 2010-11-02 LAB — APTT: aPTT: 44 seconds — ABNORMAL HIGH (ref 24–37)

## 2010-11-02 LAB — TSH: TSH: 4.974 u[IU]/mL — ABNORMAL HIGH (ref 0.350–4.500)

## 2010-11-02 LAB — CLOSTRIDIUM DIFFICILE EIA

## 2010-11-02 LAB — LIPID PANEL
Cholesterol: 118 mg/dL (ref 0–200)
HDL: 19 mg/dL — ABNORMAL LOW (ref >39)
Triglycerides: 79 mg/dL (ref <150)

## 2010-11-02 LAB — MAGNESIUM: Magnesium: 1.9 mg/dL (ref 1.5–2.5)

## 2010-11-02 LAB — PHOSPHORUS: Phosphorus: 3.8 mg/dL (ref 2.3–4.6)

## 2010-11-02 LAB — BRAIN NATRIURETIC PEPTIDE: Pro B Natriuretic peptide (BNP): 1410 pg/mL — ABNORMAL HIGH (ref 0.0–100.0)

## 2010-11-02 LAB — HEMOGLOBIN A1C: Mean Plasma Glucose: 146 mg/dL

## 2010-11-05 LAB — COMPREHENSIVE METABOLIC PANEL
ALT: 27 U/L (ref 0–35)
AST: 27 U/L (ref 0–37)
Albumin: 3.2 g/dL — ABNORMAL LOW (ref 3.5–5.2)
BUN: 11 mg/dL (ref 6–23)
CO2: 30 mEq/L (ref 19–32)
Calcium: 8.7 mg/dL (ref 8.4–10.5)
Calcium: 8.9 mg/dL (ref 8.4–10.5)
Chloride: 94 mEq/L — ABNORMAL LOW (ref 96–112)
Chloride: 99 mEq/L (ref 96–112)
Creatinine, Ser: 1.11 mg/dL (ref 0.4–1.2)
Creatinine, Ser: 1.12 mg/dL (ref 0.4–1.2)
GFR calc Af Amer: 56 mL/min — ABNORMAL LOW (ref >60)
GFR calc Af Amer: 57 mL/min — ABNORMAL LOW (ref >60)
GFR calc non Af Amer: 47 mL/min — ABNORMAL LOW (ref >60)
Glucose, Bld: 88 mg/dL (ref 70–99)
Sodium: 132 mEq/L — ABNORMAL LOW (ref 135–145)
Total Bilirubin: 0.7 mg/dL (ref 0.3–1.2)
Total Bilirubin: 0.8 mg/dL (ref 0.3–1.2)

## 2010-11-05 LAB — GLUCOSE, CAPILLARY
Glucose-Capillary: 102 mg/dL — ABNORMAL HIGH (ref 70–99)
Glucose-Capillary: 105 mg/dL — ABNORMAL HIGH (ref 70–99)
Glucose-Capillary: 118 mg/dL — ABNORMAL HIGH (ref 70–99)
Glucose-Capillary: 120 mg/dL — ABNORMAL HIGH (ref 70–99)
Glucose-Capillary: 124 mg/dL — ABNORMAL HIGH (ref 70–99)
Glucose-Capillary: 137 mg/dL — ABNORMAL HIGH (ref 70–99)
Glucose-Capillary: 169 mg/dL — ABNORMAL HIGH (ref 70–99)
Glucose-Capillary: 192 mg/dL — ABNORMAL HIGH (ref 70–99)

## 2010-11-05 LAB — CBC
HCT: 33.4 % — ABNORMAL LOW (ref 36.0–46.0)
MCHC: 32.7 g/dL (ref 30.0–36.0)
MCV: 85.2 fL (ref 78.0–100.0)
MCV: 86.3 fL (ref 78.0–100.0)
Platelets: 220 10*3/uL (ref 150–400)
RBC: 3.87 MIL/uL (ref 3.87–5.11)
RBC: 4.74 MIL/uL (ref 3.87–5.11)
WBC: 7.7 10*3/uL (ref 4.0–10.5)
WBC: 7.9 10*3/uL (ref 4.0–10.5)

## 2010-11-05 LAB — PROTIME-INR
INR: 1.34 (ref 0.00–1.49)
INR: 1.35 (ref 0.00–1.49)
Prothrombin Time: 16.5 seconds — ABNORMAL HIGH (ref 11.6–15.2)
Prothrombin Time: 16.6 seconds — ABNORMAL HIGH (ref 11.6–15.2)
Prothrombin Time: 17 seconds — ABNORMAL HIGH (ref 11.6–15.2)

## 2010-11-05 LAB — TSH: TSH: 6.485 u[IU]/mL — ABNORMAL HIGH (ref 0.350–4.500)

## 2010-11-05 LAB — LIPASE, BLOOD: Lipase: 54 U/L (ref 11–59)

## 2010-11-15 LAB — COMPREHENSIVE METABOLIC PANEL
ALT: 15 U/L (ref 0–35)
ALT: <8 U/L (ref 0–35)
AST: 15 U/L (ref 0–37)
Alkaline Phosphatase: 48 U/L (ref 39–117)
BUN: 12 mg/dL (ref 6–23)
CO2: 23 mEq/L (ref 19–32)
CO2: 27 mEq/L (ref 19–32)
Calcium: 10.2 mg/dL (ref 8.4–10.5)
Calcium: 8.9 mg/dL (ref 8.4–10.5)
Chloride: 99 mEq/L (ref 96–112)
Creatinine, Ser: 0.85 mg/dL (ref 0.4–1.2)
GFR calc Af Amer: >60 mL/min (ref >60)
GFR calc non Af Amer: >60 mL/min (ref >60)
GFR calc non Af Amer: >60 mL/min (ref >60)
Glucose, Bld: 123 mg/dL — ABNORMAL HIGH (ref 70–99)
Potassium: 3.7 mEq/L (ref 3.5–5.1)
Sodium: 138 mEq/L (ref 135–145)
Total Protein: 7.8 g/dL (ref 6.0–8.3)

## 2010-11-15 LAB — TROPONIN I: Troponin I: 0.05 ng/mL (ref 0.00–0.06)

## 2010-11-15 LAB — POCT CARDIAC MARKERS
Myoglobin, poc: 125 ng/mL (ref 12–200)
Troponin i, poc: <0.05 ng/mL (ref 0.00–0.09)
Troponin i, poc: <0.05 ng/mL (ref 0.00–0.09)

## 2010-11-15 LAB — DIFFERENTIAL
Eosinophils Absolute: 0.1 10*3/uL (ref 0.0–0.7)
Eosinophils Absolute: 0.1 10*3/uL (ref 0.0–0.7)
Eosinophils Relative: 1 % (ref 0–5)
Lymphs Abs: 1.5 10*3/uL (ref 0.7–4.0)
Lymphs Abs: 1.6 10*3/uL (ref 0.7–4.0)
Monocytes Relative: 7 % (ref 3–12)
Neutro Abs: 5.8 10*3/uL (ref 1.7–7.7)
Neutrophils Relative %: 73 % (ref 43–77)

## 2010-11-15 LAB — CULTURE, BLOOD (ROUTINE X 2)

## 2010-11-15 LAB — URINALYSIS, ROUTINE W REFLEX MICROSCOPIC
Bilirubin Urine: NEGATIVE
Glucose, UA: NEGATIVE mg/dL
Nitrite: NEGATIVE
Specific Gravity, Urine: 1.011 (ref 1.005–1.030)
pH: 7 (ref 5.0–8.0)

## 2010-11-15 LAB — CK TOTAL AND CKMB (NOT AT ARMC)
CK, MB: 7 ng/mL — ABNORMAL HIGH (ref 0.3–4.0)
Relative Index: INVALID (ref 0.0–2.5)

## 2010-11-15 LAB — URINE CULTURE
Colony Count: NO GROWTH
Culture: NO GROWTH

## 2010-11-15 LAB — CARDIAC PANEL(CRET KIN+CKTOT+MB+TROPI)
CK, MB: 1.2 ng/mL (ref 0.3–4.0)
Relative Index: INVALID (ref 0.0–2.5)
Total CK: 37 U/L (ref 7–177)
Troponin I: 0.04 ng/mL (ref 0.00–0.06)

## 2010-11-15 LAB — TSH: TSH: 1.424 u[IU]/mL (ref 0.350–4.500)

## 2010-11-15 LAB — CBC
HCT: 41.6 % (ref 36.0–46.0)
Hemoglobin: 14.3 g/dL (ref 12.0–15.0)
MCHC: 34.3 g/dL (ref 30.0–36.0)
MCV: 88 fL (ref 78.0–100.0)
RBC: 3.95 MIL/uL (ref 3.87–5.11)
RBC: 4.72 MIL/uL (ref 3.87–5.11)
RDW: 15.3 % (ref 11.5–15.5)
WBC: 8 10*3/uL (ref 4.0–10.5)

## 2010-11-15 LAB — LIPASE, BLOOD: Lipase: 29 U/L (ref 11–59)

## 2010-11-23 LAB — POCT CARDIAC MARKERS
CKMB, poc: 1.3 ng/mL (ref 1.0–8.0)
Myoglobin, poc: 78.2 ng/mL (ref 12–200)

## 2010-11-23 LAB — DIFFERENTIAL
Basophils Absolute: 0.1 10*3/uL (ref 0.0–0.1)
Basophils Relative: 1 % (ref 0–1)
Eosinophils Absolute: 0.1 10*3/uL (ref 0.0–0.7)
Eosinophils Relative: 0 % (ref 0–5)
Lymphocytes Relative: 12 % (ref 12–46)
Lymphocytes Relative: 19 % (ref 12–46)
Lymphocytes Relative: 21 % (ref 12–46)
Lymphs Abs: 1.9 10*3/uL (ref 0.7–4.0)
Monocytes Absolute: 0.8 10*3/uL (ref 0.1–1.0)
Monocytes Absolute: 0.8 10*3/uL (ref 0.1–1.0)
Monocytes Relative: 8 % (ref 3–12)
Monocytes Relative: 8 % (ref 3–12)
Neutro Abs: 7.4 10*3/uL (ref 1.7–7.7)
Neutro Abs: 7.7 10*3/uL (ref 1.7–7.7)
Neutro Abs: 9.3 10*3/uL — ABNORMAL HIGH (ref 1.7–7.7)
Neutrophils Relative %: 71 % (ref 43–77)

## 2010-11-23 LAB — COMPREHENSIVE METABOLIC PANEL
AST: 21 U/L (ref 0–37)
Albumin: 4.5 g/dL (ref 3.5–5.2)
Alkaline Phosphatase: 75 U/L (ref 39–117)
Alkaline Phosphatase: 91 U/L (ref 39–117)
BUN: 21 mg/dL (ref 6–23)
BUN: 27 mg/dL — ABNORMAL HIGH (ref 6–23)
BUN: 29 mg/dL — ABNORMAL HIGH (ref 6–23)
CO2: 26 mEq/L (ref 19–32)
CO2: 28 mEq/L (ref 19–32)
Calcium: 9.8 mg/dL (ref 8.4–10.5)
Chloride: 105 mEq/L (ref 96–112)
Creatinine, Ser: 0.86 mg/dL (ref 0.4–1.2)
Creatinine, Ser: 0.92 mg/dL (ref 0.4–1.2)
Creatinine, Ser: 1.29 mg/dL — ABNORMAL HIGH (ref 0.4–1.2)
GFR calc Af Amer: >60 mL/min (ref >60)
GFR calc non Af Amer: 40 mL/min — ABNORMAL LOW (ref >60)
GFR calc non Af Amer: >60 mL/min (ref >60)
Glucose, Bld: 108 mg/dL — ABNORMAL HIGH (ref 70–99)
Potassium: 3.6 mEq/L (ref 3.5–5.1)
Potassium: 4.4 mEq/L (ref 3.5–5.1)
Sodium: 135 mEq/L (ref 135–145)
Total Bilirubin: 0.3 mg/dL (ref 0.3–1.2)
Total Protein: 7.3 g/dL (ref 6.0–8.3)
Total Protein: 7.8 g/dL (ref 6.0–8.3)

## 2010-11-23 LAB — CBC
HCT: 38 % (ref 36.0–46.0)
HCT: 41.6 % (ref 36.0–46.0)
Hemoglobin: 12.6 g/dL (ref 12.0–15.0)
Hemoglobin: 13.7 g/dL (ref 12.0–15.0)
MCHC: 34.5 g/dL (ref 30.0–36.0)
MCV: 87.3 fL (ref 78.0–100.0)
MCV: 87.9 fL (ref 78.0–100.0)
Platelets: 276 10*3/uL (ref 150–400)
RBC: 4.33 MIL/uL (ref 3.87–5.11)
RBC: 4.56 MIL/uL (ref 3.87–5.11)
RDW: 15 % (ref 11.5–15.5)
RDW: 15.1 % (ref 11.5–15.5)
WBC: 11.6 10*3/uL — ABNORMAL HIGH (ref 4.0–10.5)

## 2010-11-23 LAB — BRAIN NATRIURETIC PEPTIDE: Pro B Natriuretic peptide (BNP): 351 pg/mL — ABNORMAL HIGH (ref 0.0–100.0)

## 2010-11-23 LAB — URINALYSIS, ROUTINE W REFLEX MICROSCOPIC
Glucose, UA: NEGATIVE mg/dL
Glucose, UA: NEGATIVE mg/dL
Hgb urine dipstick: NEGATIVE
Ketones, ur: NEGATIVE mg/dL
Protein, ur: NEGATIVE mg/dL
Specific Gravity, Urine: 1.019 (ref 1.005–1.030)
pH: 5.5 (ref 5.0–8.0)
pH: 6.5 (ref 5.0–8.0)

## 2010-11-23 LAB — LIPASE, BLOOD: Lipase: 34 U/L (ref 11–59)

## 2010-11-23 LAB — URINE MICROSCOPIC-ADD ON

## 2010-11-23 LAB — URINE CULTURE

## 2010-12-26 NOTE — Assessment & Plan Note (Signed)
[facility]                            CARDIOLOGY OFFICE NOTE   [name] [name], [name] [name]                      MRN:          [id number]  DATE:03/26/2007                            DOB:          June 07, 1925    PRIMARY CARE PHYSICIAN:  Delaney Meigs, M.D.   REASON FOR PRESENTATION:  Evaluate patient with coronary disease.   HISTORY OF PRESENT ILLNESS:  The patient is 77 years old. She has done  well since I last saw her. She remains quite active. She does all of her  house work and walks twice a day. She gets no chest discomfort, neck or  arm discomfort. She has none of the shortness of breath which was her  previous symptom. She has no resting chest discomfort. She has no  resting shortness of breath. Denies any PND or orthopnea. She has no  palpitations. No syncope or pre-syncope. She did have a stress perfusion  study last year which demonstrated that her previously reduced EF was  now up to 56%. There was no ischemia.   PAST MEDICAL HISTORY:  1. Coronary artery disease (status post left anterior descending      artery (LAD) stenting in 1999).  2. Coronary artery bypass graft x2 in October 2004 with a left      internal mammary artery (LIMA) to the left anterior descending      artery (LAD) and an saphenous vein graft (SVG) to the posterior      descending artery (PDA).  3. Hypertension.  4. Hyperlipidemia.  5. Type 2 diabetes mellitus.  6. Bilateral renal artery stenosis (status post stenting).  7. Carotid disease status post right carotid endarterectomy.  8. Degenerative joint disease.  9. Pseudoaneurysm status post repair following catheterization in      1999.  10.CONTRAST ALLERGY.  11.Mild mitral regurgitation.  12.Mild aortic insufficiency.   ALLERGIES/INTOLERANCES:  ALTACE, SIMVASTATIN, DYE, HEPARIN.   CURRENT MEDICATIONS:  1. Labetalol 400 mg b.i.d.  2. Caduet 10/40 daily.  3. Multivitamin.   REVIEW OF SYSTEMS:  As stated in the  HPI and otherwise negative for  other systems.   PHYSICAL EXAMINATION:  The patient is in no distress. Blood pressure  160/70, heart rate 71 and regular. Weight 136 pounds.  HEENT: Eyelids unremarkable. Pupils equal, round, and reactive to light.  Fundi not visualized.  NECK: No jugular venous distention at 45 degrees. Carotid upstroke brisk  and symmetrical. No bruits, no thyromegaly.  LYMPHATICS: No adenopathy.  LUNGS: Clear to auscultation bilaterally.  BACK: No costovertebral angle tenderness.  CHEST: Unremarkable.  HEART: PMI not displaced or sustained. S1, S2 within normal limits. No  S3. No S4. No clicks, rub or murmurs.  ABDOMEN: Flat, positive bowel sounds, normal in frequency and pitch. No  bruits. No rebounds. No guarding. No midline pulsatile mass. No  organomegaly.  SKIN: No rashes, no nodules.  EXTREMITIES: 2+ pulses. No edema.   EKG: Sinus rhythm, rate 71, axis within normal limits. Intervals within  normal limits. No acute ST-T wave changes.   ASSESSMENT/PLAN:  1. Coronary disease. The patient is doing well  since last year when      she had her stress test and hospitalization. At this point, no      further cardiovascular testing is suggested. She will continue      secondary risk reduction.  2. Hypertension. Blood pressure is slightly elevated, but I have not      seen this before. She will keep an eye on this and followup with      Dr.  Lysbeth Galas as she may need medication titration.  3. Carotid stenosis. The patient had surgery on this. If she has not      had repeat Dopplers by the next time I see her I will order these.      I do not hear any bruits.  4. Risk reduction. The goal for LDL is less than 100 and HDL in the      50s. I will defer to Dr.  Lysbeth Galas.  5. Followup. I will see her in 24 months or sooner if needed.     Rollene Rotunda, MD, [facility]  Electronically Signed    JH/MedQ  DD: 03/26/2007  DT: 03/27/2007  Job #: [id number]   cc:   Delaney Meigs,  M.D.

## 2010-12-26 NOTE — Consult Note (Signed)
NAME:  [name] [name], [name] [name]               ACCOUNT NO.:  [id number]   MEDICAL RECORD NO.:  [id number]          PATIENT TYPE:  OBV   LOCATION:  3729                         FACILITY:  [facility]   PHYSICIAN:  Veverly Fells. Excell Seltzer, MD  DATE OF BIRTH:  Mar 13, 1925   DATE OF CONSULTATION:  04/06/2008  DATE OF DISCHARGE:                                 CONSULTATION   INDICATIONS:  Same.   REQUESTING PHYSICIAN:  Dr. Renee Ramus.   REASON FOR CONSULTATION:  Chest pressure and dyspnea.   HISTORY OF PRESENT ILLNESS:  [name] [name] is an 77 year old woman who was  hospitalized yesterday with progressive dyspnea as well as a pressure-  like feeling in her chest.  Her symptoms have been slowly progressing  over the last several days and, when she woke yesterday morning, she had  central chest pressure.  She has had mild orthopnea and exertional  dyspnea as well.  She denies edema.  She has a history of diastolic  heart failure exacerbations and her symptoms are reminiscent of those  prior episodes.   [name] [name] has coronary artery disease and previous bypass surgery.  Her  last cardiac catheterization was in August 2005 where she was found to  have patent bypass grafts, severe systemic hypertension and patent renal  artery stents.   Since hospitalization, she continues to have symptoms of shortness of  breath and a heaviness in her chest.  She is ruled out for myocardial  infarction with serial cardiac enzymes.  She feels better when she is  sitting upright.   PAST MEDICAL HISTORY:  1. Coronary artery disease.  Stenting of the LAD in 1999, coronary      bypass in 2004 with a LIMA to LAD and saphenous vein graft to right      PDA.  Patent grafts as outlined above and last catheterization in      2005.  2. Hypertension, renovascular with previous bilateral renal stenting.  3. Dyslipidemia.  4. Type 2 diabetes  5. Thyroid disease.  6. Right carotid endarterectomy.  7. Right femoral pseudoaneurysm repair, post  catheterization.   SOCIAL HISTORY:  The patient lives in [place].  She is a nonsmoker and  does not drink alcohol.   FAMILY HISTORY:  Her mother died at age 86 of cancer.  Her father died  of pneumoconiosis at age 66.  Her sister died recently of a myocardial  infarction.   HOME MEDICATIONS:  1. Labetalol 200 mg twice daily.  2. Caduet 10 mg daily.  3. Vicodin as needed.   HOSPITAL MEDICATIONS:  1. Aspirin.  2. Labetalol.  3. Nitroglycerin paste.  4. Protonix  5. low-dose Lovenox for DVT prophylaxis.   ALLERGIES:  Include RADIOCONTRAST MEDIA, MORPHINE and ACE INHIBITORS.   REVIEW OF SYSTEMS:  A complete 12 point review of systems was performed.  All systems are negative except as outlined in the HPI.   PHYSICAL EXAMINATION:  GENERAL:  The patient is alert and oriented.  She  is an elderly woman in no acute distress.  VITAL SIGNS:  Temperature is 98.2, heart rate 65, respiratory rate  20,  blood pressure 127/61, oxygen saturation 92% on 2 liters oxygen per  nasal cannula.  HEENT:  Normal.  NECK:  Normal carotid upstrokes.  There is a left carotid bruit, right  carotid endarterectomy scar.  JVP normal.  No thyromegaly or thyroid  nodules.  LUNGS:  There are fine bibasilar rales, otherwise clear.  CARDIOVASCULAR:  The apex is discreet and nondisplaced.  The heart has a  regular rate and rhythm.  There are no murmurs or gallops.  No right  ventricular heave or lift.  ABDOMEN:  Soft, nontender, no bruits.  No organomegaly.  BACK:  No CVA tenderness.  EXTREMITIES:  There is no clubbing, cyanosis  or edema.  Peripheral pulses 2+ and equal throughout.  SKIN:  Warm and dry without rash.  NEUROLOGIC:  Cranial nerves II-XII are intact.  Strength intact and  equal.   Chest x-ray shows cardiomegaly with clear lung fields, there is no  pulmonary edema.   EKG shows sinus rhythm with ST and T change consistent with  repolarization abnormality with asymmetric T-wave inversion.   Cannot  rule out inferolateral ischemia.   Cardiac biomarkers are negative x3 sets.  Troponins are 0.2, 0.2, 0.2.  CK MBs are 1.6, 1.5 and 1.3, creatinine 1.04, BUN 12, potassium 3.7.  Hemoglobin 13, white blood cell count 5.7, platelets 294,000.   ASSESSMENT:  Chest pressure and dyspnea, uncertain etiology:  Differential diagnosis includes diastolic heart failure versus acute  coronary syndrome.  The patient is ruled out for myocardial infarction.  Known coronary artery disease and coronary bypass surgery as outlined  with patent grafts in 2005.  Her physical exam is fairly unremarkable  with the exception of mild bibasilar rales.  However, she may still have  elevated LV filling pressures and I think it will be reasonable to try  to treat her medically with a combination IV diuresis with Lasix and on  long-acting nitrate therapy to lower filling pressures.  If she  continues with symptoms, she will require repeat cardiac  catheterization.  We will continue her beta blocker and aspirin as well.  Further plans depending on her clinical response.  We will check a BNP,  repeat EKG in a.m.   I appreciate the opportunity to participate in the care of this very  nice lady.  Please feel free to call anytime with questions.      Veverly Fells. Excell Seltzer, MD  Electronically Signed     MDC/MEDQ  D:  04/06/2008  T:  04/06/2008  Job:  [id number]

## 2010-12-26 NOTE — H&P (Signed)
NAME:  [name] [name], [name] [name]               ACCOUNT NO.:  [id number]   MEDICAL RECORD NO.:  [id number]          PATIENT TYPE:  EMS   LOCATION:  MAJO                         FACILITY:  [facility]   PHYSICIAN:  Vania Rea, M.D. DATE OF BIRTH:  Jan 26, 1925   DATE OF ADMISSION:  04/05/2008  DATE OF DISCHARGE:                              HISTORY & PHYSICAL   PRIMARY CARE PHYSICIAN:  Delaney Meigs, M.D.   PRIMARY CARDIOLOGIST:  Rollene Rotunda, MD, [facility]   CHIEF COMPLAINT:  Chest pain and dizziness since this morning.   HISTORY OF PRESENT ILLNESS:  This is an 77 year old Caucasian lady with  a history of coronary artery disease status post CABG x2 in 2004, also  history of hypertension, hyperlipidemia, and diabetes controlled on  diet.  Also history of bilateral renal artery stenosis status post  stenting in 2004.  This patient reports that she was in good health  until she woke this morning with central chest pressure about 8/10 which  did not radiate which she tried to relieve by taking some hydrocodone  which she has for her arthritic problems.  This did not completely  relieve the pain.  The pain was associated with dizziness, diaphoresis,  and nausea.  On 3-4 occasions throughout the day she almost fainted when  she stood up, but managed to support herself and did not fall and did  not lose consciousness.  Eventually as her problem progressed throughout  the day she called EMS and was brought to the emergency room.  She  reports that she received nitroglycerin by EMS and was much improved  when she came to the emergency room.  Since being in the emergency room,  the patient has had only nausea and complaints of epigastric and right  upper quadrant burning.   The patient denies any history of GERD or abdominal discomfort related  to food.  She denies any history of thrombosis, although she did have a  right groin pseudoaneurysm after her cardiac catheterization some time  ago.   Of  note, her sister died of acute MI in the recent past.  The patient  also is supposed to be on aspirin but reports that she just does not  take it.   PAST MEDICAL HISTORY:  1. Coronary artery disease.  2. History of renal artery stenosis status post stenting in 2004.  3. Status post CABG.  4. Questionable history of heparin-induced thrombocytopenia.  The      patient reportedly had normal platelets during that admission, but      the diagnosis apparently came into question.  5. History of contrast dye allergy.  6. History of mitral regurgitation, moderate.  7. History of moderate aortic insufficiency.   MEDICATIONS:  1. Labetalol 200 mg twice daily.  2. Caduet 10 mg daily.  3. Hydrocodone acetaminophen 5/325 mg p.r.n.   ALLERGIES:  1. Questionable HEPARIN allergy.  2. ACE INHIBITOR allergy.  3. IVP DYE allergy.   SOCIAL HISTORY:  The patient lives alone.  She is quite active in the  home.  She denies tobacco, alcohol, or illicit drug use.  FAMILY HISTORY:  Significant for a sister who recently died of acute MI.  Mother died of liver cancer at age 70, and the father died of black lung  at age 66.   REVIEW OF SYSTEMS:  She denies fever, cough, or cold and in fact other  than already noted above, a 10-point review of systems is remarkably  negative.   PHYSICAL EXAMINATION:  GENERAL:  A pleasant, small-built, Caucasian lady  lying flat in bed in no acute distress.  VITAL SIGNS:  Temperature is 98, pulse is 56, respirations 18, blood  pressure 155/69.  She is saturating at 97% on 2 liters.  HEENT:  Pupils are round, equal, and reactive.  Mucous membranes are  pink and anicteric.  She has no cervical lymphadenopathy and no  thyromegaly.  CHEST:  Clear to auscultation bilaterally.  CARDIOVASCULAR:  Regular rhythm without murmur.  ABDOMEN:  Soft.  She is tender in the right upper quadrant.  There are  no masses.  EXTREMITIES:  Without edema.  She has 2+ pulses bilaterally.   NEUROLOGY:  Cranial nerves II-XII grossly intact.  She has no focal  neurologic deficits.   LABORATORY DATA:  CBC is completely unremarkable with a white count of  5.6, hemoglobin 13.6, and platelets 298.  Her serum chemistry is  likewise unremarkable.  Potassium 3.9, glucose 142, BUN 15, and  creatinine 1.16.  Liver function tests are normal.  Cardiac enzymes;  myoglobin is 110, CK-MB 1.7, initial troponin 0.06, but repeat was 0.02.   Chest x-ray shows cardiomegaly without edema or air space disease.  Ultrasound was reportedly done which showed cholelithiasis without any  evidence of inflammation, but that report is not available to me.   ASSESSMENT:  1. Elderly lady with a history of coronary artery disease presenting      with chest pain now relieved since getting nitroglycerin.  Her      enzymes are remarkably normal considering how long the chest pain      has been going on.  We will admit her and do serial cardiac      enzymes.  2. Because of the history of recurrent dizziness, pulmonary embolus is      included in differential and we will get a      D-dimer and a CT angiogram of the chest or V/Q scan as indicated.  3. The patient has cholelithiasis and is notably tender in the right      upper quadrant.  She will probably benefit from cholecystectomy      once pulmonary embolism and acute coronary syndrome have been ruled      out.      Vania Rea, M.D.  Electronically Signed     LC/MEDQ  D:  04/05/2008  T:  04/05/2008  Job:  [id number]   cc:   Delaney Meigs, M.D.  Rollene Rotunda, MD, [facility]

## 2010-12-26 NOTE — Assessment & Plan Note (Signed)
[facility]                            CARDIOLOGY OFFICE NOTE   ELFRIDA, PIXLEY                      MRN:          [id number]  DATE:09/22/2008                            DOB:          Jun 23, 1925    PRIMARY CARE PHYSICIAN:  Delaney Meigs, MD   REASON FOR PRESENTATION:  Evaluate the patient with coronary disease.   HISTORY OF PRESENT ILLNESS:  The patient returns for followup.  I last  saw her in September.  She had been hospitalized in August for  management of chest pain.  Since that time, she has had no further chest  discomfort.  She has been quite active.  She denies any chest pressure,  neck or arm discomfort.  She has had no palpitations, presyncope, or  syncope.  She denies any PND or orthopnea.  She was tearful today  because her nephew has been diagnosed with terminal cancer.   Of note, her blood pressure is elevated again today.  This is the second  time, this had been elevated in the office.  She was to keep her blood  pressure diary, which apparently she did for awhile, though I never saw  these results.  Her daughter is with her today and says that her blood  pressure at home has been in the 120s when it has been checked, but it  has been a while since they checked this.  Also of note, she is not on a  statin.  She was apparently intolerant to the Zocor and perhaps Lipitor.  However, she does consent to trying another drug.   PAST MEDICAL HISTORY:  1. Coronary artery disease (status post LAD stenting in 1999.  CABG in      2004 with a LIMA to the LAD and sequential SVG to PDA.  Her most      recent catheterization was in August 2009).  2. Hypertension.  3. Hyperlipidemia.  4. Diabetes mellitus (the patient reports that she does not have this      and is not on therapy for).  5. Bilateral renal artery stenosis, status post stenting.  6. Carotid artery disease, status post right carotid endarterectomy.  7. Degenerative joint  disease.  8. Pseudoaneurysm, status post repair following catheterization in      1999.  9. Contrast allergy.  10.Mild mitral regurgitation.  11.Mild renal insufficiency.  12.Restless leg syndrome.   ALLERGIES AND INTOLERANCES:  ALTACE, ZOCOR, DYE, and HEPARIN.   MEDICATIONS:  1. Mirapex.  2. Aspirin 81 mg daily.  3. Lasix 20 mg daily.  4. Caduet 5/40 daily.  5. Metoprolol 25 mg daily.   REVIEW OF SYSTEMS:  As stated in the HPI, and otherwise negative for  other systems.   PHYSICAL EXAMINATION:  GENERAL:  The patient is in no distress.  VITAL SIGNS:  Blood pressure 160/72, heart rate 70 and regular, weight  139 pounds, and body mass index 23.  HEENT:  Eyelids unremarkable; pupils equal, round, and reactive to  light; fundi not visualized, oral mucosa unremarkable.  NECK:  No jugular venous distention at 45 degrees; carotid  upstroke  brisk and symmetric; left carotid bruit, no thyromegaly.  LYMPHATICS:  No cervical, axillary, or inguinal adenopathy.  LUNGS:  Clear to auscultation bilaterally.  BACK:  No costovertebral angle tenderness.  CHEST:  Unremarkable.  HEART:  PMI not displaced or sustained.  S1 and S2 within normal limits.  No S3, no S4.  No clicks, no rubs, no murmurs.  ABDOMEN:  Flat.  Positive bowel sounds, normal in frequency and pitch.  No bruits, no rebound, no guarding.  No midline pulsatile mass.  No  hepatomegaly, no splenomegaly.  SKIN:  No rashes.  No nodules.  EXTREMITIES:  Upper pulse 2+, 1+ dorsalis pedis and posterior tibialis  bilaterally.  Bilateral femoral bruits.  No cyanosis, no clubbing, no  edema.  NEURO:  Oriented to person, place, and time.  Cranial nerves II through  XII grossly intact.  Motor grossly intact.   EKG:  Sinus rhythm, rate 90, axis within normal limits, intervals within  normal limits, nonspecific inferolateral ST depression.   ASSESSMENT AND PLAN:  1. Coronary artery disease.  The patient is having no new symptoms.      She  is active.  No further cardiovascular testing is suggested.      She will continue with risk reduction.  2. Dyslipidemia.  The patient is not on a statin.  I am going to go      ahead and give her pravastatin 40 mg daily.  We discussed the      benefits of this.  She will get lipid profile in 8 or 9 weeks.  Her      daughter understands this.  3. Hypertension.  Blood pressure was elevated in the office.  However,      they claimed that it is well controlled at home.  I am going to      have them keep the blood pressure diary.  She states she has a very      good blood pressure cuff and has been correlated.  They are going      to call me in 2-4 weeks with the blood pressures and I will adjust      her meds based on these readings.  4. Carotid stenosis.  The patient is being followed by the vascular      surgeons and recently had a Doppler.  Therefore, I will take her      out of the computer for followup in our office.  5. Mild renal insufficiency with bilateral renal artery stenting.  Her      last creatinine was fine.  No      further therapy is warranted.  6. Followup.  I will see her back in 1 year.     Rollene Rotunda, MD, [facility]  Electronically Signed    JH/MedQ  DD: 09/22/2008  DT: 09/22/2008  Job #: [id number]   cc:   Delaney Meigs, M.D.

## 2010-12-26 NOTE — Assessment & Plan Note (Signed)
[facility]                            CARDIOLOGY OFFICE NOTE   ADDYSYN, FERN                      MRN:          [id number]  DATE:04/28/2008                            DOB:          July 17, 1925    PRIMARY CARE PHYSICIAN:  Delaney Meigs, M.D.   REASON FOR PRESENTATION:  Evaluate the patient for coronary disease in  recent hospitalization.   HISTORY OF PRESENT ILLNESS:  The patient was hospitalized on April 05, 2008, with weakness.  She did have some chest discomfort.  She  subsequently underwent cardiac catheterization.  (I cannot at this time  get the cath reports.  She was found to have nonobstructive disease and  was managed medically).  The patient had her medications adjusted as it  was felt that this might be some other cause of her weakness.  She was  sent home among other meds Toprol 100 mg daily.  She still felt very  weak at home, subsequently had this reduced to 25 mg.  Once this was  done, she has made a significant recovery.  She has had much less  weakness.  She is now getting back to living on her own.  She does not  have any presyncope or syncope.  She is having no confusion, which was a  problem.  She is having no chest discomfort, neck, or arm discomfort.  She is having no shortness of breath.   PAST MEDICAL HISTORY:  1. Coronary artery disease (status post LAD stenting in 1999.  She had      a CABG in 2004 with a LIMA to the LAD and a saphenous vein graft to      PDA).  2. Hypertension.  3. Hyperlipidemia.  4. Type 2 diabetes mellitus.  5. Bilateral renal artery stenosis status post stenting.  6. Carotid artery disease status post right carotid endarterectomy.  7. Degenerative joint disease.  8. Pseudoaneurysm status post repair following catheterization in      1999.  9. Contrast allergy.  10.Mild mitral regurgitation.  11.Mild renal insufficiency.  12.Restless leg syndrome.   ALLERGIES/INTOLERANCES:  ALTACE,  ZOCOR, CONTRAST DYE, and HEPARIN.   MEDICATIONS:  1. Toprol 25 mg daily.  2. Caduet 5/40 daily.  3. Lasix 20  mg daily.  4. Aspirin 81 mg daily.  5. Mirapex.   REVIEW OF SYSTEMS:  As stated in the HPI and otherwise negative for  other systems.   PHYSICAL EXAMINATION:  GENERAL:  The patient is pleasant and in no  distress.  VITAL SIGNS:  Blood pressure 160/80, heart rate 78 and regular, weight  133 pounds.  HEENT:  Eyelids unremarkable.  Pupils equal, round, and reactive to  light.  Fundi not visualized.  Oral mucosa unremarkable.  NECK:  No jugular venous distension at 45 degrees, carotid upstroke  brisk and symmetrical, no bruits, and no thyromegaly.  LYMPHATICS:  No cervical, axillary, or inguinal adenopathy.  LUNGS:  Clear to auscultation bilaterally.  BACK:  No costovertebral angle tenderness.  CHEST:  Well-healed sternotomy scar.  HEART:  PMI not displaced or sustained, S1 and  S2 are within normal  limits, no S3, no S4, no clicks, no rubs, no murmurs.  ABDOMEN:  Flat, positive bowel sounds, normal in frequency and pitch, no  bruits, no rebound, no guarding, no midline pulsatile mass, no  hepatomegaly or splenomegaly.  SKIN:  No rashes, no nodules.  EXTREMITIES:  2+ pulses throughout, no edema, no cyanosis, and no  clubbing.  NEURO:  Oriented to person, place, and time, cranial nerves II through  XII grossly intact, motor grossly intact.   EKG, sinus rhythm, rate 78, axis within normal limits, intervals within  normal limits, no acute ST-T wave changes.   ASSESSMENT AND PLAN:  1. Coronary artery disease.  The patient had nonobstructive disease as      reported in the discharge summary.  I will try to get the      catheterization report so I will have the details.  We are going to      continue with secondary risk reduction.  2. Hypertension.  Blood pressure is not well controlled on this      meeting.  However, she was symptomatic with high doses of       antihypertensives.  Therefore, I need to make sure her blood      pressure is elevated before changing her medications.  I have asked      her daughter buy her a blood pressure cuff, which they will do.      She will keep the blood pressure 3-4 times a week and keep a dairy.      I will then see that back and make changes based on these readings.  3. Dyslipidemia.  She has had a recent lipid profile in the hospital.      Her HDL was slightly low, but her LDL was well controlled.  She      will continue with Caduet.  4. Diabetes mellitus per Dr. Lysbeth Galas.  5. Peripheral vascular disease.  I will make sure that she has up to      date carotid Dopplers.  6. Followup.  I will see her back in 3 months or sooner if needed.     Rollene Rotunda, MD, [facility]  Electronically Signed    JH/MedQ  DD: 04/28/2008  DT: 04/29/2008  Job #: [id number]   cc:   Delaney Meigs, M.D.

## 2010-12-26 NOTE — H&P (Signed)
NAME:  [name] [name], [name] [name]               ACCOUNT NO.:  [id number]   MEDICAL RECORD NO.:  [id number]          PATIENT TYPE:  INP   LOCATION:  3707                         FACILITY:  [facility]   PHYSICIAN:  Theodosia Paling, MD    DATE OF BIRTH:  22-Apr-1925   DATE OF ADMISSION:  04/10/2008  DATE OF DISCHARGE:                              HISTORY & PHYSICAL   PRIMARY CARE PHYSICIAN:  Delaney Meigs, M.D.   PRIMARY CARDIOLOGIST:  Rollene Rotunda, MD, [facility].   CHIEF COMPLAINT:  Chest heaviness and dizziness, feeling of impending  doom.   HISTORY OF PRESENT ILLNESS:  An 77 year old Caucasian lady with history  of coronary artery disease was recently admitted to the hospital for  similar complaint.  The patient underwent cardiac catheterization which  showed severe but non-stentable coronary artery disease.  She was  recently discharged from the hospital on April 09, 2008 for medical  management.  She was also found to have systolic dysfunction during the  last hospitalization.  She has a history of coronary artery bypass  grafting in 2004, hypertension, hyperlipidemia and diabetes and  bilateral renal artery stenosis.  The patient underwent stenting in  2004.  The patient went home but she did not take her Lasix. According  to her, this morning around 11 a.m., she was sitting and she started to  have chest heaviness again.  It stayed with her for a couple of hours.  She had a band-like headache.  She has not had a bowel movement for the  last two weeks.  She feels extremely lethargic and called a family  member who brought her to the hospital for further evaluation and  management.   PAST MEDICAL HISTORY:  1. Coronary artery disease.  2. Renal artery stenosis, status post stenting.  3. Status post coronary artery bypass grafting.  4. 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  5. CONTRAST DYE ALLERGY.  6. Mitral regurgitation.  7. Moderate aortic insufficiency.   HOME MEDICATIONS:  1. Toprol XL 100  mg p.o. daily.  2. Ativan 1 mg p.o. q.12h.  3. Vicodin 5/325 mg two tablets p.o. q.6h. p.r.n. for pain.  4. Aspirin 81 mg enteric-coated p.o. daily.  5. Lasix 20 mg p.o. daily p.r.n. for fluid overload.  6. Caduet 5/40 mg, 1 p.o. daily.   ALLERGIES:  1. The patient has questionable HEPARIN allergy.  2. IVP DYE allergy.   SOCIAL HISTORY:  The patient lives alone. She is quite active at home.  She denies tobacco, alcohol or illicit drug abuse.   FAMILY HISTORY:  Significant for a sister who died of acute MI.  Mother  died of liver  cancer at 66 and father died of black lung disease at 43.   REVIEW OF SYSTEMS:  CONSTITUTIONAL:  The patient denies any fever,  weight loss, weight gain, or other constitutional symptoms.  HEENT:  Denies blurred vision, ear or nose discharge.  LUNGS:  Denies productive  cough, hemoptysis, pleuritic pain.  CARDIOVASCULAR:  Chest heaviness as  explained in HPI.  Denies any palpitations.  She has no obvious PND or  orthopnea present.  ABDOMEN:  The patient has constipation throughout  the week.  She has lower abdominal tenderness.  She has tried several  enemas and laxatives, which were of no avail.  EXTREMITIES:  The patient  denies any pedal edema. MUSCULOSKELETAL:  The patient denies any  synovial effusions.  No synovitis or arthritis.  SKIN:  The patient also  denies any skin breakdown, cellulitis.  The patient has bruises in the  back from aspirin therapy.  CNS:  The patient denies any focal  neurologic deficits, seizures or ataxia.  PSYCHIATRIC: Denies homicidal  ideation.  Denies suicidal ideation.  She takes Ativan for anxiety  p.r.n.   PHYSICAL EXAMINATION:  VITAL SIGNS:  Oxygen saturation 89% on room air.  Heart rate 72.  Blood pressure 110/60.  Respiratory rate 20.  GENERAL:  No acute cardiorespiratory distress. The patient appears a  little lethargic.  HEENT:  Pupils are equal, round and reactive to light.  Extraocular  movements intact. No ear  or nose  discharge.  No evidence of JVD.  LUNGS:  Minimal bibasilar crepitations.  No rhonchi appreciated.  CARDIOVASCULAR:  The patient has early diastolic murmur in the aortic  area.  S1 and S2 otherwise normal.  No rub.  ABDOMEN:  The patient is soft.  Normal bowel sounds.  The patient has  nonspecific tenderness  and mild tenderness in the lower abdominal area.  EXTREMITIES:  No pedal edema.  2+ pulses present.  NEUROLOGIC: Cranial nerves II-XII intact.  Motor and sensory intact.  The patient has mechanical difficulty with rotator cuff tear on the left  upper extremity which caused restricted movement of the left upper  extremity.  PSYCHIATRIC:  The patient is oriented x3.   LABORATORY DATA:  WBC 9.3, hemoglobin 13.2, hematocrit 39.3, platelets  272,000.  Electrolytes:  Sodium 137, potassium 3.3, chloride 100, bicarb  28, glucose 97, BUN 16, creatinine 1.14, AST 32, ALT 19, total protein  6.7, albumin 4.1, calcium 9.5.  CK-MB 5.2,  troponin less than 0.5,  myoglobin 288.   ASSESSMENT/PLAN:  1. Congestive heart failure.  The patient does not have very obvious deep venous thrombosis.  I would  request a  chest x-ray because the patient appears to have minimal  hypoxia to room air, but when she takes deep breathing,  it resolved.  I  would include a BNP level to attribute this to the congestive heart  failure.  Meanwhile the patient feels very congested, so I will give her  a trial of IV Lasix, just two doses, for today.  I will also include a  chest x-ray.  Decrease the dose of beta blocker.  That may be causing  her excessive lethargy.  1. Hypertension-  Started Lasix, reduce metoprolol and if BP permits may use afterload  reduction with hydralazine as the  the patient is apparently allergic to  ARB and ACE.  1. Coronary artery disease.  The patient has severe native coronary artery disease which has not been  amenable to stenting.  However, her grafts were patent.  I will  continue  the cardioprotective medication which include aspirin, beta blocker and  statin.  Cardiac enzymes will be observed for every eight hours for  three sets.  The significant of her native coronary artery disease could  be contributing to the excessive fatigue as well.  I doubt there is any  blockage rupture as EKG changes and first set of cardiac enzymes are  negative.  Patient has questionable history if heparin  induced  thrombocytopenia so we will avoid that.  1. Stool impaction.  The patient has been receiving narcotics during      her last hospitalization.  Also she was sent home on Vicodin      without any laxatives.  Fleet's enema and Dulcolax suppository is      ordered.  The patient has no evidence of bowel obstruction or      obvious obstruction.  Most likely adynamic constipation.  2. Restless leg syndrome. Continue Ativan at this time.  3. Prophylaxis.  SCDs, The patient has questionable history of HIT.      Also Protonix for GI prophylaxis is requested.  4. Code status:  The patient is full code.      Theodosia Paling, MD  Electronically Signed     NP/MEDQ  D:  04/10/2008  T:  04/10/2008  Job:  [id number]   cc:   Delaney Meigs, M.D.  Rollene Rotunda, MD, [facility]

## 2010-12-26 NOTE — H&P (Signed)
NAME:  [name] [name], [name] [name]               ACCOUNT NO.:  [id number]   MEDICAL RECORD NO.:  [id number]          PATIENT TYPE:  INP   LOCATION:  5126                         FACILITY:  [facility]   PHYSICIAN:  Cherylynn Ridges, M.D.    DATE OF BIRTH:  [birth year]   DATE OF ADMISSION:  11/06/2008  DATE OF DISCHARGE:                              HISTORY & PHYSICAL   IDENTIFICATION/CHIEF COMPLAINT:  The patient is an 77 year old with  biliary colic, possible acute cholecystitis who comes in for IV pain  control and hydration.   HISTORY OF PRESENT ILLNESS:  The patient has been having symptoms over  the past week.  She was originally seen up in [place] and was  recently seen in the [facility] by Dr. Cyndia Bent where she was set up for surgery on Monday to remove her  gallbladder.   Today, her pain worsened, where it became 9-10/10.  She received some IV  morphine in the emergency room and it decreased to 3/10.  However, the  patient and the family state that she has not been able to eat or get  comfortable over several days' time and has been seen 3 different times  for this recurrent problem.  I was called to see her because of known  history of gallstones and continued pain.   PAST MEDICAL HISTORY:  1. Significant coronary artery disease.  Status open coronary artery      bypass surgery and coronary stents.  2. Renal artery stenosis with stents.  3. Status post stenting of coronary artery disease.  4. History of heparin-induced thrombocytopenia.  5. History of contrast allergy.  6. History of mitral valve regurgitation.  7. History of moderate aortic insufficiency.  8. Hypertension.   PAST SURGICAL HISTORY:  Coronary artery bypass surgery.  She has had a  history of carotid endarterectomy, also mentioned renal artery stenosis  with stenting and she has only one functional kidney by report.   MEDICATIONS:  1. Toprol-XL 25 mg p.o. daily.  2. Caduet 10 mg p.o.  daily.  3. Lasix 20 mg p.o. twice a day.  4. Aspirin, which she has not taken for over a week.  5. Phenergan, which she was taking for pain and for nausea.  6. Hydrocodone and acetaminophen combination for pain that started      last week.  She also takes it for some pain unrelated to her      gallbladder.   FAMILY HISTORY:  Unremarkable.   SOCIAL HISTORY:  She is a nonsmoker and nondrinker.   REVIEW OF SYSTEMS:  She has had nausea and some vomiting, and maybe  fevers up to 100.9, and chills.   PHYSICAL EXAMINATION:  VITALS:  On exam today, she is afebrile at 97.9,  pulse of 73, blood pressure 199/83.  HEENT:  She is normocephalic, atraumatic, and anicteric.  CHEST:  Clear to auscultation.  CARDIAC:  Cannot auscultate any murmurs, gallops, rubs, or heaves.  ABDOMEN:  Soft, but she does have some mild-to-moderate tenderness in  the right upper quadrant.  No palpable masses.  Family states  that her  abdomen is bloated.  RECTAL AND PELVIC:  Not performed.  EXTREMITIES:  No cyanosis, clubbing, or edema.  She has got no unusual  skin rashes, but she does have some easy bruisability.   LABORATORY DATA:  Her white count is 11,000 without a left shift.  Hemoglobin 14.1, hematocrit 41, BUN is 27, creatinine is 0.9.  Electrolytes all otherwise within normal limits.  Her LFTs that I can  see are normal.  No x-rays are done.   IMPRESSION:  Biliary colic and possible acute cholecystitis in a woman  known to have gallstone disease.  She was on the schedule for Dr. Jamey Ripa  __________ currently, we can keep her on that schedule, I do not think  she needs to have surgery done any sooner.  We will collect her  information from preop short stay and place it on the patient's chart.  In the meantime, I will start her on some Zosyn in case she has acute  gallbladder disease, and control the pain with morphine.  She will get  Zofran for nausea.  I have discussed this with the family and we will   proceed in this manner.      Cherylynn Ridges, M.D.  Electronically Signed     JOW/MEDQ  D:  11/07/2008  T:  11/07/2008  Job:  [id number]

## 2010-12-26 NOTE — Op Note (Signed)
NAME:  [name], [name]               ACCOUNT NO.:  [id number]   MEDICAL RECORD NO.:  [id number]          PATIENT TYPE:  INP   LOCATION:  5126                         FACILITY:  [facility]   PHYSICIAN:  Currie Paris, M.D.DATE OF BIRTH:  03-Feb-1925   DATE OF PROCEDURE:  11/08/2008  DATE OF DISCHARGE:                               OPERATIVE REPORT   PREOPERATIVE DIAGNOSIS:  Chronic calculous cholecystitis with biliary  colic.   POSTOPERATIVE DIAGNOSIS:  Chronic calculous cholecystitis with biliary  colic.   OPERATION:  Laparoscopic cholecystectomy.   SURGEON:  Currie Paris, MD   ASSISTANT:  Anselm Pancoast. Zachery Dakins, MD   ANESTHESIA:  General endotracheal.   CLINICAL HISTORY:  This is an 77 year old lady with a history of biliary-  type symptoms that had been ongoing for several days.  She was scheduled  for cholecystectomy today, but because of pain management issues was  admitted yesterday on an urgent basis.   DESCRIPTION OF PROCEDURE:  I saw the patient in the holding area and she  had no further questions.  At the time, I saw her, she was nontender and  comfortable, and I thought consistent with a chronic cholecystitis.   The patient was taken to the operating room and after satisfactory  general endotracheal anesthesia had been obtained, the abdomen was  prepped and draped, and the time-out done.   I used 0.25% plain Marcaine for each incision.  The umbilical incision  was made, the fascia opened, and the peritoneal cavity entered under  direct vision.  A purse-string was placed, the Hasson introduced, and  the abdomen insufflated to 15.   With the patient in reverse Trendelenburg and tilted to the left, a  10/11 trocar was placed in the epigastrium in two 5 mm laterally.  The  gallbladder was distended and slightly tense, but not acutely inflamed.   I was able to grasp it and retracted over the liver.  The peritoneum at  the area of the cystic duct was opened and  I dissected out a nice  segment of cystic duct and cystic artery, so I had the anatomy clearly  identified.  I had considered doing an operative cholangiogram if there  was any question about her anatomy, but since everything was clear and  she has a severe allergy to IV CONTRAST, I elected not to do a  cholangiogram.   Three clips were placed on the stay side of the cystic duct and 1 on the  gallbladder side and it was divided.  The cystic artery was divided in a  similar fashion.  A smaller branch was also clipped.   The gallbladder was removed from below to above with coagulation current  of the cautery.  Because of so thin-walled and distended, we did spill a  little bile, but once it was disconnected, I was able to put it in the  bag and use a couple of liters of fluid to make sure everything was  irrigated clear.  The gallbladder was brought out the umbilical port.  A  final check for bleeding and to be sure there  were no bile leaks was  made and everything appeared okay.  The lateral ports were removed under  direct vision.  The umbilical site was closed with a purse-string.  The  abdomen was deflated through the epigastric port.  Skin was closed with  4-0 Monocryl subcuticular and Dermabond.   The patient tolerated the procedure well.  There were no operative  complications.  All counts were correct.      Currie Paris, M.D.  Electronically Signed     CJS/MEDQ  D:  11/08/2008  T:  11/08/2008  Job:  [id number]   cc:   Delaney Meigs, M.D.  Rollene Rotunda, MD, [facility]

## 2010-12-26 NOTE — Discharge Summary (Signed)
NAME:  [name] [name], [name] [name]               ACCOUNT NO.:  [id number]   MEDICAL RECORD NO.:  [id number]          PATIENT TYPE:  INP   LOCATION:  3737                         FACILITY:  [facility]   PHYSICIAN:  Renee Ramus, MD       DATE OF BIRTH:  1925-08-10   DATE OF ADMISSION:  04/05/2008  DATE OF DISCHARGE:  04/09/2008                               DISCHARGE SUMMARY   PRIMARY DIAGNOSIS:  Weakness.   SECONDARY DIAGNOSES:  1. Coronary artery disease.  2. Hypertension.  3. Hyperlipidemia.  4. Renal artery stenosis status post stent placement.  5. Diabetes mellitus.  6. Restless legs syndrome.   HOSPITAL COURSE:  1. Weakness.  The patient is an 77 year old female who is admitted      secondary to increasing weakness and mild chest pain.  The patient      has history of coronary artery disease and we thought that her      symptoms were, while atypical and certainly not rising to the level      of definitive acute coronary syndrome, had enough weight to warrant      further risk stratification, cardiology consult was called for.  He      recommended cardiac cath.  She did undergo a cath.  She had      evidence of extensive coronary artery disease, but no lesion of      which was amenable to stent.  The patient did also have evidence of      systolic heart failure.  The patient has tolerated the procedure      well.  Her symptoms are now cleared and she is being discharged to      home. The patient will be placed on Toprol-XL for her systolic      heart failure.  She will not be placed on ACE inhibitor or ARB      since she is allergic to these medications.  The patient will also      have Lasix as needed for fluid retention.  2. Coronary artery disease as above.  3. Hypertension.  4. The patient will be continued on her Norvasc as well as her Toprol-      XL and additional medications per her cardiologist and primary care      doctor.  5. Hyperlipidemia.  The patient will be continued on  statin therapy.  6. Renal artery stenosis status post stent is currently stable.  7. Diabetes mellitus.  The patient is type 2 diabetic relatively well      controlled with diet.  Her blood sugars have been running in the      mid 100s however on the day of discharge, the patient did have a      hypoglycemic episode, received D50 with a resultant increase in      blood glucose to 384.  The patient is stable and she will not be      started on diabetic medicines at this time.   Labs of note:  1. No evidence of leukocytosis.  2. Mild anemia with hemoglobin 11.7 and hematocrit  of 35.  3. BUN 16 and creatinine 1.2.  4. TSH of  6 with a free T4 of 1.09.  5. Cholesterol showing total cholesterol of 127, with an LDL of 59,      and an HDL of 28.  6. UA showing no evidence of infection.   STUDIES:  1. Coronary artery cath as previously dictated.  2. Ultrasound of the abdomen showing multiple calculi within the      gallbladder, but no evidence of pericholecystic fluid.  No      sonographic Murphy sign.  No dilated ducts.   IMPRESSION:  1. Cholelithiasis without cholecystitis.  2. Two views of the chest showing borderline cardiomegaly with      evidence of old granulomatous disease, no acute airspace      abnormality.   DISCHARGE MEDICATIONS:  1. Toprol-XL 100 mg p.o. daily.  2. Ativan 1 mg p.o. b.i.d. p.r.n. anxiety and restless legs syndrome.  3. Vicodin 5/325 1-2 p.o. q.6 h. p.r.n. pain.  4. Aspirin 81 mg p.o. daily.  5. Lasix 20 mg p.o. daily p.r.n. fluid overload.  6. Caduet 5/40, 1 p.o. daily.   There are no labs or studies pending at the time of discharge.  The  patient is in stable condition and anxious for discharge.   Time spent 35 minutes.      Renee Ramus, MD  Electronically Signed     JF/MEDQ  D:  04/09/2008  T:  04/10/2008  Job:  [id number]
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
## 2010-12-29 NOTE — Discharge Summary (Signed)
NAME:  [name] [name], [name] [name]               ACCOUNT NO.:  [id number]   MEDICAL RECORD NO.:  [id number]          PATIENT TYPE:  INP   LOCATION:  6715                         FACILITY:  [facility]   PHYSICIAN:  Hettie Holstein, D.O.    DATE OF BIRTH:  January 13, 1925   DATE OF ADMISSION:  09/11/2004  DATE OF DISCHARGE:  09/14/2004                                 DISCHARGE SUMMARY   PRIMARY CARE PHYSICIAN:  Delaney Meigs, M.D.   CARDIOLOGIST:  Rollene Rotunda, M.D.   ADMISSION DIAGNOSIS:  1.  Increased shortness of breath.  2.  Abdominal discomfort.   DISCHARGE DIAGNOSES:  1.  Urinary retention felt to be exacerbated by constipation status post      bowel regimen with good response in the hospital and resolution of      urinary retention, recommendations to followup with her primary care      physician on 09/18/2004, recommendations to followup in the emergency      department if she again develops difficulty voiding at home.  2.  Coronary artery disease status post revascularization in 2004.  3.  Status post atypical chest complaint in the hospital this admission      without evidence of acute myocardial infarction or electrocardiogram      changes and prompt resolution with reinitiation of her home      antihypertensive medication, labetalol.  4.  Congestive heart failure decompensation with admitted B-type natriuretic      peptide above 300 with diuresis and resolution of symptoms.  Prior      history of coronary artery disease with revascularization in 2004 and      has underlying ejection fraction of about 39%.  5.  Chronic renal insufficiency.  Creatinine is 1.5 at the time of      discharge, about a year ago it was 1.6. This is stable.  6.  Hypertension.  7.  Renal artery stenosis status post stenting.  8.  Moderate mitral regurgitation.  9.  Diabetes mellitus type 2.  10. Status post carotid endarterectomy.  11. 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  12. History of  aortic insufficiency.  13. DRUG ALLERGY TO ALTACE AND HEPARIN.   DISCHARGE MEDICATIONS:  1.  Labetalol 200 mg twice a day as she was on prior to arrival.  2.  Instead of Lasix, started on Norvasc 2.5 mg daily until complete      sustained resolution of her urinary symptoms.  3.  Senokot one tablet daily with instructions to hold if she develops      diarrhea.  4.  Dulcolax suppositories two in the evening as needed.   DISCHARGE INSTRUCTIONS:  Strict, 4 gram per day, low-sodium diet until  resuming Lasix.  She was instructed to check daily weight and call her MD if  she has a 3-5 pound weight gain. She was instructed to return to the  emergency department if she experiences again inability to void.   FOLLOW UP:  Followup appointment arranged with Dr. Lysbeth Galas on 10/08/2004 at  8:45 a.m., Monday.   HOSPITAL COURSE:  Mrs. Muller  course was that of improvement in her CHF  symptoms following catheterization and bladder decompression with a Foley  catheter.  She underwent voiding trials and continued to have small  residual, approximately 140 ml by bladder scan, however, she was voiding  appropriately at the time of discharge with approximately 1240 ml in and  1150 ml out on the previous day, 600 in and 400 out on the day of discharge.  She is felt to be medically stable for discharge and she should followup  with her primary care physician, Dr. Lysbeth Galas.      ESS/MEDQ  D:  09/14/2004  T:  09/14/2004  Job:  [id number]   cc:   Delaney Meigs, M.D.  [address]  [place]  [place] [address]  Fax: [phone]   Rollene Rotunda, M.D.

## 2010-12-29 NOTE — Discharge Summary (Signed)
NAME:  [name] [name], [name] [name]               ACCOUNT NO.:  [id number]   MEDICAL RECORD NO.:  [id number]          PATIENT TYPE:  INP   LOCATION:  5126                         FACILITY:  [facility]   PHYSICIAN:  Currie Paris, M.D.DATE OF BIRTH:  [birth year]   DATE OF ADMISSION:  11/06/2008  DATE OF DISCHARGE:  11/09/2008                               DISCHARGE SUMMARY   FINAL DIAGNOSIS:  Chronic calculous cholecystitis.   MEDICAL HISTORY:  Ms. Ground is an 77 year old lady with known coronary  artery disease who has been having episodes of abdominal pain and  biliary colic associated with nausea.  She was scheduled for an elective  cholecystectomy but had to be admitted because of ongoing symptoms on  early the morning of November 07, 2008.  She was begun on some IV fluids,  antibiotics.   HOSPITAL COURSE:  The patient was admitted as noted.  She was taken to  the operating room on November 08, 2008, where laparoscopic cholecystectomy  was performed.  She had intense distended gallbladder but no acute  inflammation.  The morning following surgery she was feeling much  better.  Her preoperative symptoms had resolved.  Her abdomen was soft  and benign, and her lungs were clear.   The patient was discharged to resume all usual home medications.  She  was given some pain medication.  She is to follow up in my office.   The pathology report confirmed chronic cholecystitis and cholelithiasis.  Other laboratory studies included an EKG that showed normal sinus rhythm  with an anterior infarct in the past.  Laboratory studies included a  hemoglobin of 14 with a white count of 11,000.  Electrolytes were normal  except for a slightly elevated BUN of 27 and a nonfasting glucose of  104.  Liver functions were normal.      Currie Paris, M.D.  Electronically Signed     CJS/MEDQ  D:  11/24/2008  T:  11/25/2008  Job:  [id number]   cc:   Delaney Meigs, M.D.  Rollene Rotunda, MD, [facility]

## 2010-12-29 NOTE — Discharge Summary (Signed)
NAME:  [name] [name], [name] [name]                           ACCOUNT NO.:  [id number]   MEDICAL RECORD NO.:  [id number]                   PATIENT TYPE:  INP   LOCATION:  2001                                 FACILITY:  [facility]   PHYSICIAN:  Jesse Sans. [name], M.D.                DATE OF BIRTH:  February 21, 1925   DATE OF ADMISSION:  03/22/2004  DATE OF DISCHARGE:  03/25/2004                                 DISCHARGE SUMMARY   DISCHARGE DIAGNOSES:  1. Admitted with acute onset of congestive heart failure, compensated prior     to discharge.  2. Chest pain.     a. Patent grafts by catheterization this admission.  3. Coronary artery disease, status post coronary artery bypass graft.  4. Hypertension.  5. Cardiomyopathy with an ejection fraction of 39%.  6. Bilateral renal artery stenosis, status post stenting.     a. Patent stents by catheterization this admission.  7. History of mild to moderate mitral regurgitation.  8. Moderate aortic insufficiency.  9. Diabetes mellitus type 2.  10.      Hyperlipidemia.  11.      Status post right carotid endarterectomy.  12.      Left shoulder degenerative joint disease.  13.      History of heparin-induced thrombocytopenia.  14.      History of intravenous dye allergy   PROCEDURE:  Cardiac catheterization by Dr. Salvadore Farber on March 23, 2004.  Please see his dictated note for complete details.  Briefly, this  patient had native two vessel disease with patent grafts.  Right renal  artery stent was widely patent.  Left renal artery stent had 40-50% end-  stent restenosis.  The EF was 39% and 2+ MR was noted.   HISTORY OF PRESENT ILLNESS:  Please see the admission history and physical  by Dr. Jesse Sans. [name] for complete details.  Briefly, this 77 year old  female patient presented to [name] after the sudden  onset of rapid heart beat that was irregular, smothering sensation, and  chest pressure.  Her initial point of care enzymes were  negative.  Her  troponin I eventually did come back elevated at 0.33.  Peaked at 0.42 and  trended down and was 0.22 by discharge.  Her CK-MBs were negative x 4.  Her  initial BNP was 1554.  She was given Lasix initially.  She had a history of  positive heparin-induced thrombocytopenia as well as contrast dye allergy.  Pharmacy was asked to assist and she was placed on __________ for  anticoagulation.   Her catheterization was performed the next day by Dr. Salvadore Farber and  the results are noted above.  It was a question whether or not her acute CHF  was secondary to hypertension versus ischemia.  The catheterization as noted  above ruled this out.  The patient was started back on her Labetalol for  blood pressure control as her pressures are running 190s to 180s  systolically.  Her creatinine bumped a little bit at 1.6 on March 24, 2004.  She was kept in the house overnight.  She complained of left shoulder pain.   On the morning of March 25, 2004, her blood pressure was better was 122/48.  Her creatinine was stable at 1.6.  The patient was to be discharged home  after her left shoulder x-ray was complete.  This showed accelerated  glenohumeral degenerative changes.  No fractures.  AC degenerative changes.  Subacromial spurring, suspect rotator cuff tear.  The patient was felt  stable enough for discharge to home.   LABORATORY DATA:  White count 8500, hemoglobin 13.9, hematocrit 41, platelet  count 287,000.  Admission INR 1.1.  Sodium 135, potassium 3.7, glucose 119,  BUN 36, creatinine 1.6, calcium 9.2.  Total bilirubin 0.5, alkaline  phosphate 77, AST 25, ALT 23, total protein 7.1, albumin 3.8.  Cardiac  enzymes as noted above.  TSH 2.982.   Chest x-ray on March 24, 2004 with improved aeration in the lung bases,  otherwise unchanged.  Chest showed mild pulmonary vascular congestion,  stable.   DISCHARGE MEDICATIONS:  1. Aspirin 81 mg q.d.  2. Labetalol 200 mg b.i.d.  3.  Lipitor 10 mg q.h.s.  4. Nitroglycerin p.r.n. chest pain.  5. Lasix p.r.n. as taken previously at home.  6. Tylenol p.r.n.  7. Nitroglycerin p.r.n. for chest pain.   DISCHARGE INSTRUCTIONS:  She is to call our office in [place] or [place] with  recurrent chest pain activity.  No driving, heavy lifting, exertion for  three days.   DIET:  Low fat, low sodium, diabetic diet.   WOUND CARE:  The patient is to call our office for any groin swelling,  bleeding, or bruising.   FOLLOW UP:  Follow up will be with Dr. Rollene [name] in [name] the next  couple of weeks and the office will contact her for an appointment.      Tereso Newcomer, P.A.                        [name] [name], M.D.    SW/MEDQ  D:  03/25/2004  T:  03/25/2004  Job:  [id number]   cc:   Delaney Meigs, M.D.  [address]  [place]  [place] [address]  Fax: [phone]   [name] [name], M.D.

## 2010-12-29 NOTE — Discharge Summary (Signed)
NAME:  [name] [name], [name] [name]               ACCOUNT NO.:  [id number]   MEDICAL RECORD NO.:  [id number]          PATIENT TYPE:  INP   LOCATION:  3707                         FACILITY:  [facility]   PHYSICIAN:  Theodosia Paling, MD    DATE OF BIRTH:  09/12/1924   DATE OF ADMISSION:  04/10/2008  DATE OF DISCHARGE:  04/12/2008                               DISCHARGE SUMMARY   PRIMARY CARE PHYSICIAN:  Delaney Meigs, MD   ADMITTING HISTORY:  Please refer to the admission note dictated by me on  April 10, 2008.   PRIMARY CARDIOLOGIST:  Rollene Rotunda, MD, [facility]   DISCHARGE DIAGNOSES:  1. Chest pain.  The patient underwent telemetry and cardiac enzymes,      which was negative.  EKG was fine.  Cardioprotective medications      were continued.  The patient had a history of HIT, therefore,      Lovenox was avoided.  2. Congestive heart failure.  The patient received somelasix in ER,      she did not have any evidence of acute decompensation so was      subsequently stopped.  3. Hypertension.  The patient's metoprolol was reduced to add      hydralazine for after load reduction as the patient is apparently      was allergic to angiotensin II receptor blocker and angiotensin-      converting enzyme inhibitor.  4. Stool impaction.  The patient had stool impaction which was      resolved with Dulcolax suppository.   DISCHARGE MEDICATIONS:  1. Dulcolax 100 mg p.o. daily.  2. Ativan 1 mg p.o. q.12 h.  3. Vicodin 5/325 mg p.o. q.6 hours p.r.n.  4. Aspirin 81 mg p.o. daily.  5. Caduet 5/40 mg p.o. daily.  6. Lasix 20 mg p.o. q.12 h.  7. Dulcolax was reduced from 100 mg to actually 25 mg p.o. daily.   PROCEDURE PERFORMED:  None.   CONSULTATIONS OBTAINED:  None.   DISPOSITION:  The patient will follow up with the primary care physician  within 1 week time and to Dr. Rollene Rotunda on as needed basis.   Total time spent in discharge of this patient, 35 minutes.      Theodosia Paling, MD  Electronically Signed     NP/MEDQ  D:  06/03/2008  T:  06/04/2008  Job:  [id number]   cc:   Delaney Meigs, M.D.  Rollene Rotunda, MD, [facility]

## 2010-12-29 NOTE — Cardiovascular Report (Signed)
NAME:  [name] [name], [name] [name]                           ACCOUNT NO.:  [id number]   MEDICAL RECORD NO.:  [id number]                   PATIENT TYPE:  INP   LOCATION:  2304                                 FACILITY:  [facility]   PHYSICIAN:  Charlies Constable, M.D.                  DATE OF BIRTH:  [birth year]   DATE OF PROCEDURE:  06/08/2003  DATE OF DISCHARGE:                              CARDIAC CATHETERIZATION   PROCEDURE PERFORMED:  Cardiac catheterization.   CARDIOLOGIST:  Charlies Constable, M.D.   CLINICAL HISTORY:  Ms. Werden is 77 years old and has had a previous non-ST  elevation infarction in 1999 with subsequent stenting of the circumflex  artery.  She has developed recent recurrent angina and was admitted today  with unstable angina.   DESCRIPTION OF PROCEDURE:  The procedure was performed via the left femoral  artery using arterial sheath and 6 French preformed coronary catheters.  A  femoral arterial puncture was performed and Omnipaque contrast was used.  Subclavian injection was performed to access the internal mammary artery for  its suitability for bypass grafting.  Distal aortogram was performed to  evaluate her for renovascular causes of hypertension.   The patient tolerated the procedure well and left the laboratory in  satisfactory condition.   RESULTS:  Left Main Coronary Artery:  The left main coronary artery is free  of significant disease.   Left Anterior Descending Artery:  The left anterior descending artery gave  rise to two large septal perforators and two diagonal branches.  There was a  90 and a 50-70% narrowing in the proximal LAD, and there were 80 and 70%  narrowing in the mid LAD.  There was heavy calcification throughout the  vessel.   Circumflex Artery:  The circumflex artery gave rise to a ramus branch, a  marginal branch and a small AV branch.  There was 0% stenosis at the stent  site in the marginal branch.  There was no other major obstruction in the  circumflex  system.   Right Coronary Artery:  The right coronary artery is completely occluded  proximally after a conus branch.  Distal right coronary artery consists of  posterior descending and posterolateral branch, which filled via collaterals  the left coronary artery.   VENTRICULOGRAPHIC DATA:  Left Ventriculogram:  The left ventriculogram  performed in the RAO projection showed hypokinesis of the inferior wall.  The rest of the wall motion was fairly good and the estimated ejection  fraction was 50%.  There was 2+ or moderate mitral regurgitation.   Subclavian Injection:  A subclavian injection showed no subclavian stenosis  and patent vertebral and internal mammary arteries.   AORTOGRAPHIC DATA:  Distal Aortogram:  A distal aortogram was performed,  which showed 80% narrowing in the right renal artery and 90% narrowing in  the left renal artery.  There were irregularities in the aortoiliac system.  The aortic pressure was 194/76 with a mean of 118 and the left ventricular  pressure was 194/23.   CONCLUSIONS:  1. Coronary artery disease status post prior non-ST elevation infarction     with subsequent stenting of the circumflex artery with 0% stenosis at the     stent site in the circumflex artery, 90% proximal and 50-70% proximal     stenosis the left anterior descending,  and 80 and 70% stenoses in the     mid left anterior descending, and total occlusion of the right coronary     artery with inferior wall hypokinesis and an ejection fraction of 50%     with mild-to-moderate mitral regurgitation.  2. Bilateral renal artery stenosis.   RECOMMENDATIONS:  The patient's anatomy is not favorable for percutaneous  revascularization.  I reviewed the films with Dr. Antoine Poche and will obtain  surgical consultation.                                                 Charlies Constable, M.D.    BB/MEDQ  D:  06/08/2003  T:  06/08/2003  Job:  [id number]   cc:   Delaney Meigs, M.D.  [address]  [place]  [place] [address]  Fax: [phone]   Rollene Rotunda, M.D.   Cardiopulmonary Laboratory

## 2010-12-29 NOTE — Consult Note (Signed)
NAME:  [name] [name], [name] [name]                           ACCOUNT NO.:  [id number]   MEDICAL RECORD NO.:  [id number]                   PATIENT TYPE:  INP   LOCATION:  3741                                 FACILITY:  [facility]   PHYSICIAN:  Salvatore Decent. Cornelius Moras, M.D.              DATE OF BIRTH:  02-27-1925   DATE OF CONSULTATION:  06/09/2003  DATE OF DISCHARGE:                                   CONSULTATION   REQUESTING PHYSICIAN:  Rollene Rotunda, M.D.   PRIMARY CARE PHYSICIAN:  Delaney Meigs, M.D.   REASON FOR CONSULTATION:  Severe two vessel coronary artery disease with  class 1 stable angina.   HISTORY OF PRESENT ILLNESS:  The patient is a 77 year old widowed white  female from [place], [place], with known history of coronary artery disease, as  well as history of hypertension, hyperlipidemia, and possible newly type 2  diabetes mellitus.  The patient suffered an acute myocardial infarction in  1999, at which time she underwent cardiac catheterization followed by  percutaneous coronary intervention with stent placement in the left  circumflex coronary artery.  She did well, although she did develop a false  aneurysm at the site of her heart catheterization which required surgical  repair by Dr. Liliane Bade.  Since then, the patient has done well up until  the last two or three months.  She reports progressive symptoms of  exertional shortness of breath and fatigue.  Two days ago the patient  developed new onset substernal chest pain with pain radiating to both  shoulders and arms and associated with shortness of breath and diaphoresis.  The pain persisted for a total of approximately three hours, prompting  hospital admission.  She has ruled out for acute myocardial infarction,  although cardiac enzymes were borderline elevated with a CK-MB of 4, total  CK of 65, and serum troponin-I's of 0.37.  She was taken for cardiac  catheterization yesterday evening by Dr. Charlies Constable.  Findings at the time  of catheterization were notable for severe two vessel coronary artery  disease, not amenable to percutaneous coronary intervention.  There was also  a suggestion of moderate mitral regurgitation.  Cardiac surgical  consultation has been requested.   REVIEW OF SYSTEMS:  GENERAL:  The patient reports exertional fatigue that  has progressed over the last few months.  She has a good appetite.  She  states she has lost a few pounds over the last two years on purpose.  CARDIAC:  Notable for symptoms as described previously.  The patient denies  any episodes of resting shortness of breath, PND, orthopnea, or lower  extremity edema.  She has had progressive symptoms of exertional shortness  of breath, such that she gets short of breath walking 20 to 30 feet at this  point in time.  She has also had two or three episodes of fainting spells.  These have been associated with  stress, but the patient has blacked-out  transiently.  She has not had any palpitations.  RESPIRATORY:  Notable for a  persistent dry nonproductive cough.  The patient thinks this may be related  to blood pressure medications.  She denies any productive cough, hemoptysis,  wheezing.  GASTROINTESTINAL:  Notable for some difficulty with constipation.  She denies any difficulty swallowing.  She reports no hematochezia,  hematemesis, melena.  MUSCULOSKELETAL:  Essentially negative.  She has had  some mild intermittent problems with her left knee.  NEUROLOGIC:  Negative.  The patient specifically denies any symptoms suggestive of previous TIA or  stroke.  She has had these transient dizzy spells and fainting spells.  GENITOURINARY:  Negative.  INFECTIOUS:  Negative.  She denies recent fever  or chills.  HEMATOLOGIC:  Negative.  ENDOCRINE:  Notable for borderline type  2 diabetes mellitus diagnosed this admission.  PSYCHIATRIC:  Negative.  HEENT:  Negative.  The patient has a full set of upper and lower dentures.   PAST MEDICAL  HISTORY:  1. History of hypertension.  2. History of coronary artery disease with myocardial infarction in 1999.  3. The patient has newly appreciated hyperlipidemia and borderline type 2     diabetes mellitus.   PAST SURGICAL HISTORY:  1. Notable only for repair of false aneurysm, right groin, site of cardiac     catheterization in 1999, by Dr. Liliane Bade.  2. The patient also had repair of a tendon on the right foot due to an open     laceration.   FAMILY HISTORY:  Notable in that the patient's son has had a heart attack  and uncles on mother's side have had premature coronary artery disease.   SOCIAL HISTORY:  The patient is widowed and lives with one of her sons and  son-in-law in [place].  She recently retired as a Production designer, theatre/television/film for an apartment  building complex.  She is a nonsmoker and denies history of alcohol  consumption.   MEDICATIONS PRIOR TO ADMISSION:  1. Aspirin.  2. Benicar.   ALLERGIES:  The patient denies any known drug allergies or sensitivities.   PHYSICAL EXAMINATION:  GENERAL:  Notable for a well-appearing white female  who appears her stated age.  VITAL SIGNS:  Blood pressure 142/50, pulse 60 and regular, oxygen saturation  95% on room air, she is afebrile.  HEENT:  Essentially unrevealing.  There are bilateral carotid bruits.  There  is no palpable cervical or supraclavicular lymphadenopathy.  There is no  jugular venous distention.  No carotid bruits are noted.  CHEST:  Auscultation of the chest is notable for clear and symmetrical  breath sounds bilaterally.  No wheezes or rhonchi are demonstrated.  CARDIOVASCULAR:  Notable for regular rate and rhythm.  No murmurs, rubs, or  gallops are noted.  Breath sounds are clear and symmetrical bilaterally.  No  wheezes or rhonchi noted.  ABDOMEN:  Slightly obese, soft, nontender.  There are no palpable masses.  Bowel sounds are present.  EXTREMITIES:  Warm and well perfused.  There is no lower extremity  edema. Distal pulses are not palpable in either lower legs at the ankle.  There is  no sign of venous insufficiency.  SKIN:  Clean and dry, but somewhat thin and frail-appearing.  RECTAL:  Deferred.  GENITOURINARY:  Deferred.  NEUROLOGIC:  Grossly nonfocal and symmetrical throughout.   DIAGNOSTIC TESTS:  Cardiac catheterization performed by Dr. Juanda Chance is  reviewed and notable for severe two-vessel coronary artery disease  with 90%  proximal stenosis of the left anterior descending coronary artery, followed  by 70 to 80% tandem lesions of the mid portion of this vessel.  These are  heavily calcified.  The left circumflex coronary artery remains widely  patent at the site of previous percutaneous coronary intervention.  There is  100% proximal occlusion of the right coronary artery with left-to-right  collateral filling of the distal right coronary circulation.  There is  inferior wall hypokinesis with overall ejection fraction estimated at 40 to  50%.  There is mild to moderate mitral regurgitation.   A 2-D echocardiogram performed today is reviewed, and notable for the  presence of mild mitral regurgitation and mild to moderate aortic  insufficiency.  Left ventricular function is mildly reduced.  No other  significant abnormalities are appreciated.   Carotid duplex scan performed today is notable for bilateral carotid artery  stenosis with greater than an 80% right internal carotid artery stenosis and  60 to 80% left internal carotid artery stenosis.   IMPRESSION:  Severe two vessel coronary artery disease with coronary anatomy  not favorable for percutaneous coronary intervention.  The patient has class  1 stable angina with progressive symptoms of exertional shortness of breath.  She has mild left ventricular dysfunction and probably only mild mitral  regurgitation, as well as mild aortic insufficiency.  She also has severe  asymptomatic bilateral internal carotid artery stenosis.   Other comorbid  conditions are as listed.   PLAN:  I have outlined options at length with Mrs. Blitzer and her family.  Alternative treatment strategies have been discussed.  They understand and  accept all associated risks of surgery, including, but not limited to, risk  of death, stroke, myocardial infarction, congestive heart failure,  respiratory failure, pneumonia, bleeding requiring transfusion, arrhythmia,  infection, and recurrent coronary artery disease.  Mrs. Dorr desires to  think things over before making a final decision, although she is leaning  towards surgery which we will tentatively plan for Friday, June 11, 2003.  She needs a vascular surgery consult to evaluate her asymptomatic bilateral  carotid artery stenosis.                                               Salvatore Decent. Cornelius Moras, M.D.    CHO/MEDQ  D:  06/09/2003  T:  06/09/2003  Job:  [id number]   cc:   Rollene Rotunda, M.D.   Charlies Constable, M.D.   Delaney Meigs, M.D.  [address]  [place]  [place] [address] Fax: [phone]   [facility]  [place], [place]

## 2010-12-29 NOTE — Discharge Summary (Signed)
NAME:  [name] [name], [name] [name]                           ACCOUNT NO.:  [id number]   MEDICAL RECORD NO.:  [id number]                   PATIENT TYPE:  INP   LOCATION:                                       FACILITY:  [facility]   PHYSICIAN:  [facility]                  DATE OF BIRTH:  11-08-1924   DATE OF ADMISSION:  06/21/2003  DATE OF DISCHARGE:  07/01/2003                                 DISCHARGE SUMMARY   DISCHARGE DIAGNOSES:  On admission:  1. Volume overload, orthopnea, progressive dyspnea, peripheral edema and     pulmonary edema.  2. Bilateral pleural effusions, left greater than right, status post     thoracentesis, November 12, with 500 cc of fluid from a moderate-sized     left pleural effusion.  3. Bilateral renal artery stenosis, discovered at the time of     catheterization, May 29, 2003, with elevated creatinine of 1.8 on     this admission.  Status post bilateral renal artery stenting, November     10th, with creatinine returning to normal within 24 hours from 1.8 to     1.3.  4. HIT antibody detected.  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  5. Positive E. coli urinary tract infection, discharged on Cipro.   SECONDARY DIAGNOSES:  1. History of coronary artery disease, status post left heart     catheterization, June 08, 2003.  Status post coronary artery bypass     graft surgery, October 29th.  Status post right carotid endarterectomy,     October 29th.  2. Status post myocardial infarction, 1999 with stent placed in the left     circumflex artery.  3. Repair of arteriovenous fistula at the time of stent placement, P. Liliane Bade, M.D.  4. History of cerebrovascular occlusive disease, status post right carotid     endarterectomy, June 11, 2003.  5. Hypertension.  6. Dyslipidemia.  7. Recent onset diabetes mellitus.  8. Moderate mitral regurgitation with a showing of normal mitral valve     function on echocardiogram, November 8th with normal left  ventricular     function.  9. Contrast allergy.   PROCEDURES:  #1.  June 23, 2003:  Bilateral renal artery stenting,  Salvadore Farber, M.D.  Stenosis in the left renal artery reduced from 90%  to 0%; stenosis in the right renal artery reduced from 80% to 10%.  At the  time of this procedure, the patient was found to be very resistant to  heparin and HIT ELISA was sent.   #2.  Left thoracentesis, June 25, 2003, Salvatore Decent. Cornelius Moras, M.D.  500 cc  of fluid removed from a moderate-sized left pleural effusion.  Cultures were  negative.   DISCHARGE DISPOSITION:  [name] is ready for discharge November 18th.  She has undergone 48 hours of overlap of Coumadin therapy to  Coumadin with  simultaneous infusion of Angiomax.  This as prophylaxis against thrombosis  of renal artery stents in setting of HIT II antibody.  The patient still has  a persistent cough, but is breathing much better and does not require any  supplemental oxygen after having undergone left thoracentesis.  The patient  is ambulating well and she is alert and oriented.  She is not having fevers.  She is taking oral nourishment well.  The incisions that were placed at the  time of coronary artery bypass and right carotid endarterectomy, are healing  nicely.  The patient goes home on the following medications:   DISCHARGE MEDICATIONS:  1. Enteric coated aspirin 81 mg daily.  2. Coumadin 4 mg tablets, 1/2 tablet every even number day, 2 mg; one tablet     every odd number day.  In other words, alternating 2 mg with 4 mg every     other day.  3. Zocor 20 mg daily at bedtime.  4. Labetalol 200 mg tablets, two tablets twice daily.  5. Lasix 40 mg twice daily.  6. Altace 5 mg twice daily.  7. Plavix 75 mg daily for four days until November 22.  8. Potassium chloride 20 mEq daily.  9. Cipro 250 mg twice daily for three days, against urinary tract infection.   FOLLOWUP:  As followup, she will see Delaney Meigs,  M.D. in his office,  Monday, November 22nd at 9:30 in the morning.  Blood will be taken to check  Coumadin levels and also for kidney function.  Second visit is [employer].  Cornelius Moras, M.D., Monday, July 12, 2003 at 2:15 p.m.  She is to get a chest x-  ray at [facility] at 1:15 p.m. on the 29th.  And finally,  she will see Rollene Rotunda, M.D. at the [facility] office, [facility],  Wednesday July 14, 2003 at 1 o'clock in the afternoon.   DISCHARGE ACTIVITY:  To walk daily to keep up her strength.   DISCHARGE DIET:  A low sodium, low cholesterol diet.   BRIEF HISTORY:  [name] is a 77 year old female, recently  discharged from [facility] on November 7th after having chest pain  and acute coronary syndrome.  During her hospitalization, catheterization  showed multivessel disease and she underwent coronary artery bypass graft  surgery as well as right carotid endarterectomy.  She now presents on  November 8th with worsening shortness of breath since her discharge.  The  chest pain she is having now, is similar to her angina, but located in the  upper abdomen.  She further reports orthopnea, lower extremity edema.  She  has a cough but no fevers, chills, nausea or vomiting.  She has noted that  her weight is slightly up.  She did walk at home but this has become more  and more difficult.  The patient was seen in the emergency room and has  undergone diuresis.  She has put out over 2 liters with 80 mg of IV Lasix  with significant symptomatic improvement.  She will be admitted and  echocardiogram will be obtained.  Adralazine will be added to her medical  regimen.  She will also, perhaps, have her renal artery stenoses  investigated and perhaps these known stenoses will respond to angioplasty.   HOSPITAL COURSE:  [name] was admitted November 8th to [facility] through the emergency room with progressive dyspnea, orthopnea  and found to have volume overload  and pulmonary edema which responded to  diuresis.  Enzymes were obtained and they were negative.  Upon  stabilization, she was assessed for her renal artery stenosis and she was  setup for a BTA study.  Bilateral stents were placed on June 23, 2003.  Heparin resistance was noted during the procedure and an HIT assay was  sought.  Her anticoagulation was immediately switched to bivalirudin.  The  patient was found to be HIT II antibody positive.  She is continued on  aspirin and Plavix and started on Coumadin.  She also had concurrent  Angiomax anticoagulation until the Coumadin was therapeutic for 48 hours.  She was also found to be dyspneic with exertion after studying of her renal  artery stenosis and chest x-ray showed a moderate-sized left pleural  effusion.  She underwent thoracentesis November 12 with removal of 500 cc of  fluid and with some symptomatic improvement.  Culture of the fluid was  negative.  Of note, after her renal artery stenosis, her creatinine which  was stable at 1.8, had diminished to 1.3 within 24 hours.  The patient has  improved as regards to her pulmonary status after thoracentesis and  diuresis.  Her medications have been changed from 40 mg of Lasix daily to 40  mg twice daily.  She also has potassium supplementation.  She is ready for  discharge after 48 hours of therapeutic Coumadin levels, concurrent with  Angiomax infusion.  At discharge, she was also found to have E. coli,  positive urinary tract infection and was started on ciprofloxacin for a  three day course.   LABORATORY STUDIES:  June 30, 2003:  Electrolytes-sodium 134, potassium  4.2, chloride 95, carbon dioxide 32, glucose 123, BUN 17, creatinine 1.3.  Complete blood count on June 30, 2003:  White cells 6.3, hemoglobin 9.7,  hematocrit 29.1, platelets 394.  BNP on November 17, 638.  Once again,  heparin associated antibody was detected.  Liver studies  were performed this  hospitalization.  On November 8th, cholesterol 108, triglycerides 101.  HDL  cholesterol 35, LDL cholesterol 53.  TSH study on November 8th, 7.853.  This  is an elevated study.  However, T4 was 7.9 and T3 was 70.7, both within  normal limits.  Urine culture, June 29, 2003-greater than 100,000  colonies of Escherichia coli.      Maple Mirza, P.A.                    [facility]    GM/MEDQ  D:  07/01/2003  T:  07/02/2003  Job:  [id number]   cc:   Di Kindle. Edilia Bo, M.D.  [address]  [place]  [place] [address]   Rollene Rotunda, M.D.   Delaney Meigs, M.D.  [address]  [place]  [place] [address]  Fax: [phone]   Salvatore Decent. Cornelius Moras, M.D.  [address]  [place]  [place] [address]

## 2010-12-29 NOTE — H&P (Signed)
NAME:  [name] [name], [name] [name]                           ACCOUNT NO.:  [id number]   MEDICAL RECORD NO.:  [id number]                   PATIENT TYPE:  EMS   LOCATION:  MAJO                                 FACILITY:  [facility]   PHYSICIAN:  Jesse Sans. Wall, M.D.                DATE OF BIRTH:  [birth year]   DATE OF ADMISSION:  03/22/2004  DATE OF DISCHARGE:                                HISTORY & PHYSICAL   CHIEF COMPLAINT:  Sudden onset of rapid heartbeat which was irregular,  smothering sensation, and chest pressure.   HISTORY OF PRESENT ILLNESS:  [name] is a very pleasant 78-year-  old single white female from [place] who has known coronary artery disease.  She had a myocardial infarction and PTCA of the circumflex in 1999. She had  recurrent angina and underwent coronary artery bypass grafting on June 11, 2003. At that time she had a LIMA to the LAD, vein graft to the PDA. She  had mild to moderate mitral regurgitation, but the valve looked normal  during surgery apparently. She also had a concomitant right carotid  endarterectomy and also has residual disease in the left internal carotid  artery. She also a history of hypertension, type 2 diabetes, hyperlipidemia,  and also has renal artery stenosis with a 90% lesion on the left and 80% on  the right by cath on June 08, 2003. She also has moderate aortic  insufficiency by echocardiogram.  She was sitting watching television today  when she experienced the above symptoms. She also had some blurred vision.  The chest discomfort and chest pressure radiated into her left arm and she  had the palpitations. She went to her primary care physician, was given  nitroglycerin, and blood pressure was at 220 systolic. She was given aspirin  324 mg. EMS gave her nitroglycerin times three with no relief. She was still  having some fullness in her chest in the emergency room.   Chest x-ray shows some hilar fullness with cephalization of blood  flow and  some interstitial edema. I am giving her 40  mg of Lasix now.   PAST MEDICAL HISTORY:  In addition to the above, she has a history of  heparin-induced thrombocytopenia. She has a history of rash to Altace. She  is allergic to CONTRAST. She also has a DYE ALLERGY.   MEDICATIONS:  At home include Lasix p.r.n., aspirin 81 mg a day, labetalol  unknown dose two b.i.d.   SOCIAL HISTORY:  She lives in [place], alone. She has six children. She does  not smoke, drink, or use recreational drugs.   FAMILY HISTORY:  Remarkable for hypertension and coronary artery disease in  two sisters. They are both alive.   REVIEW OF SYSTEMS:  She has some reflux symptoms and some nasal drainage and  discharge. Other than that unremarkable.   PHYSICAL EXAMINATION:  VITAL SIGNS: Blood pressure is  now 112/65 and was  192/75. Pulse 74 and regular. She is in sinus rhythm. Respiratory rate is 20  and normal inspiration pattern. Temperature is 98.8. Saturation is 97% on  room air.  GENERAL: She is well developed, well nourished. She is in no acute distress.  SKIN: Warm and dry.  HEENT: Normocephalic and atraumatic. Pupils equal, round, and reactive to  light and accommodation. Extraocular movements are intact. Sclerae clear.  NECK: Supple with no carotid upstrokes, no JVD, no thyromegaly. Trachea  midline. There are good carotid upstrokes with no obvious bruit. She is  status post carotid endarterectomy on the right. No lymphadenopathy.  CARDIOVASCULAR: Regular rate and rhythm. She has a 2/6 mitral regurgitation  murmur which is mid systolic and blowing in nature. It radiates to the  axilla. S2 is physiologically split. She has a soft diastolic murmur along  the left sternal border. There is no gallop.  LUNGS: Bibasilar crackles one-third the way up.  SKIN: Clear.  ABDOMEN: Soft with good bowel sounds. There is no midline tenderness. There  is no midline bruit. There is no hepatomegaly.  EXTREMITIES:  No clubbing, cyanosis, or edema. Pulses are present, but  diminished.  NEUROLOGIC: Grossly intact.   Chest x-ray is as above. EKG shows some increased ST segment depression  laterally which was there before and possibly suggestive of more ischemia.   Point of care enzymes are negative times one. Labs thus far reveal a  hemoglobin of 11.3, white count 6300. Electrolytes are pending.   ASSESSMENT:  1. Acute onset of congestive heart failure, rule out paroxysmal atrial     fibrillation, worsening mitral regurgitation either from ischemia or     mechanically, myocardial infarction, or myocardial ischemia. She was also     very hypertensive in her primary care physician's office which could have     exacerbated this.  2. Known coronary artery disease.  3. Hypertension.  4. Type 2 diabetes.  5. Hyperlipidemia.  6. Bilateral renal artery stenosis.  7. Cerebrovascular disease, status post right carotid endarterectomy.  8. Mild to moderate mitral regurgitation.  9. Moderate aortic insufficiency.  10.      History of congestive heart failure.  11.      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   PLAN:  1. Cardiac enzymes times three.  2. Diuresis.  3. Follow up BMP.  4. BNP.  5. Cardiac catheterization, left cord grafts.  6. Consult pharmacy about anticoagulation measures.  7. Increase aspirin 325 mg a day.  8. Add beta blocker when blood pressure allows.                                                Thomas C. Wall, M.D.    TCW/MEDQ  D:  03/22/2004  T:  03/22/2004  Job:  [id number]   cc:   Delaney Meigs, M.D.  [address]  [place]  [place] [address]  Fax: [phone]   Rollene Rotunda, M.D.

## 2010-12-29 NOTE — Op Note (Signed)
NAME:  [name] [name], [name] [name]                           ACCOUNT NO.:  [id number]   MEDICAL RECORD NO.:  [id number]                   PATIENT TYPE:  INP   LOCATION:  2020                                 FACILITY:  [facility]   PHYSICIAN:  Larina Earthly, M.D.                 DATE OF BIRTH:  1925-03-20   DATE OF PROCEDURE:  06/11/2003  DATE OF DISCHARGE:  06/20/2003                                 OPERATIVE REPORT   PREOPERATIVE DIAGNOSES:  1. Severe coronary artery disease.  2. Severe right carotid artery stenosis.   POSTOPERATIVE DIAGNOSES:  1. Severe coronary artery disease.  2. Severe right carotid artery stenosis.   PROCEDURE:  Right carotid endarterectomy and Dacron patch angioplasty.   SURGEON:  Larina Earthly, M.D.   ASSISTANT:  Toribio Harbour, N.P.   ANESTHESIA:  General endotracheal.   COMPLICATIONS:  None.   DISPOSITION:  The patient will then undergo coronary artery bypass grafting  by Salvatore Decent. Cornelius Moras, M.D., to be dictated in a separate note.   PROCEDURE IN DETAIL:  The patient was taken to the operating room.  The  right neck, chest, and lower extremities were prepped and draped in the  usual sterile fashion.  An incision was made anterior to the  sternocleidomastoid and carried to the platysma with electrocautery.  The  sternocleidomastoid was reflected posteriorly and the carotid sheath was  opened.  The facial vein was ligated with 2-0 silk ties and divided.  The  common carotid artery was encircled with an umbilical tape Rumel tourniquet.  Dissection continued onto the bifurcation.  The external carotid was  encircled with a blue vessel loop and the internal carotid was encircled  with an umbilical tape Rumel tourniquet.  The superior thyroid artery was  encircled with a 2-0 silk Potts tie.  The hypoglossal and vagus nerves were  identified and preserved.  The patient was given 7000 units of intravenous  heparin.  After adequate circulation time, the internal,  external, and  common carotid arteries were occluded.  The common carotid artery was opened  with an 11 blade, extended longitudinally with Potts scissors through the  plaque with the Potts scissors.  A 10 shunt was passed up the internal  carotid artery, allowed to back bleed, then down the common carotid artery,  where it was secured with Rumel tourniquets.  The endarterectomy begun on  the common carotid artery and the plaque was divided proximally with Potts  scissors.  The endarterectomy was extended onto the bifurcation.  The  external carotid was endarterectomized with an eversion technique.  The  internal carotid was endarterectomized in an open fashion.  The remaining  atheromatous debris was removed from the endarterectomy plane.  The Finesse  Hemashield Dacron patch was brought onto the field and was sewn as a patch  angioplasty with a running 6-0 Prolene suture.  Prior to completion of the  anastomosis, the shunt was removed and the usual flushing maneuvers were  undertaken.  The anastomosis was then completed and the external, followed  by the common, and finally the internal carotid artery clamp was removed.  Excellent flow characteristics were noted with hand-held Doppler in the  internal and external carotid arteries.  The wounds were temporarily closed  with a Surgicel and a 4 x 4 gauze in the wound with temporary closure with 2-  0 nylon mattress sutures.  At the completion of the coronary bypass  grafting, the patient had closure in the standard fashion with 3-0 Vicryl in  the platysma and 4-0 subcuticular Vicryl stitch.                                               Larina Earthly, M.D.    TFE/MEDQ  D:  07/27/2003  T:  07/28/2003  Job:  [id number]

## 2010-12-29 NOTE — H&P (Signed)
NAME:  [name] [name], [name] [name]               ACCOUNT NO.:  [id number]   MEDICAL RECORD NO.:  [id number]          PATIENT TYPE:  EMS   LOCATION:  MAJO                         FACILITY:  [facility]   PHYSICIAN:  Lonia Blood, M.D.      DATE OF BIRTH:  03/28/1925   DATE OF ADMISSION:  09/11/2004  DATE OF DISCHARGE:                                HISTORY & PHYSICAL   PRIMARY CARE PHYSICIAN:  Delaney Meigs, M.D., in [place].   CARDIOLOGIST:  Rollene Rotunda, M.D.   CHIEF COMPLAINT:  1.  Increased shortness of breath.  2.  Abdominal discomfort.   HISTORY OF PRESENT ILLNESS:  This is a 77 year old female with a history of  coronary artery disease, status post CABG last year, also CHF exacerbation  with an EF of about 39% from cath in August 2005.  The patient is being  followed by Dr. Rollene Rotunda, cardiologist.  She came into the ED  yesterday with CHF exacerbation.  She was given an increased dose of Lasix  and sent home.  The patient has been feeling bad and feeling, according to  her, weak, and filled up with fluids, so she returned today to the emergency  room.  On arrival, she was found to have a full bladder and retention.  Initial Foley insertion showed the patient had 700 cc of urine retained.  She was subsequently demand and asked to be retained in the hospital rather  than be discharged.   PAST MEDICAL HISTORY:  1.  CHF with an EF of 39% as indicated.  2.  Coronary artery disease, status post CABG in November 2004.  3.  Hypertension.  4.  Renal stents in place.  5.  Mild to moderate MR.  6.  Type 2 diabetes.  7.  Status post CEA.  8.  Left shoulder DJD.  9.  History of HIT.  10. Aortic insufficiency.  11. History of urologic surgeries.   ALLERGIES:  1.  ALTACE.  2.  HEPARIN causes thrombocytopenia.   MEDICATIONS:  The patient is not sure of her dose, but indicates that she  takes:  1.  Lasix.  2.  Labetalol.   PREVIOUS DISCHARGE MEDICATIONS:  1.  Lipitor 10 mg  q.h.s.  2.  Nitroglycerin p.r.n.  3.  Also, the patient was supposed to take aspirin.   SOCIAL HISTORY:  The patient lives in [place] alone.  The patient has six  children.  No tobacco or alcohol intake.   FAMILY HISTORY:  Pertinent for coronary artery disease in two sisters.  Also  hypertension and some mild diabetes.   REVIEW OF SYSTEMS:  Mainly as in HPI, otherwise, other systems negative.   PHYSICAL EXAMINATION:  VITAL SIGNS:  Temperature is 98.5, blood pressure  initially 119/99, pulse 77, respiratory rate 20, saturation 100% on room  air.  GENERAL:  The patient is alert and oriented, in no acute distress.  Able to  give history.  HEENT:  PERRLA.  EOMI.  NECK:  Supple, mild JVD elevation.  CHEST:  Good air entry bilaterally with mild crackles at the bases.  CARDIOVASCULAR:  The patient has S1 and S2, with grade 3/6 ejection murmurs  at the apex and over the precordium.  ABDOMEN:  Soft, full, nontender, positive bowel sounds.  EXTREMITIES:  No cyanosis, clubbing, or edema.   LABORATORY DATA:  Her initial labs showed a sodium of 137, potassium 3.7,  chloride 104, BUN 27, glucose 96, bicarbonate 29.  BNP of 358.  EKG is  essentially unchanged from August 2005.   ASSESSMENT:  This is a 77 year old with a history of coronary artery  disease, congestive heart failure exacerbation, as well as per patient, non-  functioning kidney.  The patient is here mainly with congestive heart  failure exacerbation, but more importantly, with some urinary retention.  We  will therefore proceed as follows:   PLAN:  1.  We will admit her to telemetry for 23 hour observation.  We will try to      rule out myocardial infarction with checking cardiac enzymes serially.      We will also continue with diuresis and her home medications.  We will      check 2-D echocardiogram since her last 2-D echocardiogram was in 2004.      We will also consider cardiac consult if any of these parameters change.       In the moment, however, we will try in-and-out cath to see if she      continues to retain her urine.  If urinary retention continues to be a      problem, we will ask urology to see the patient for further evaluation      and workup.      LG/MEDQ  D:  09/11/2004  T:  09/11/2004  Job:  [id number]

## 2010-12-29 NOTE — Cardiovascular Report (Signed)
NAME:  [name] [name], [name] [name]                           ACCOUNT NO.:  [id number]   MEDICAL RECORD NO.:  [id number]                   PATIENT TYPE:  INP   LOCATION:  2001                                 FACILITY:  [facility]   PHYSICIAN:  Salvadore Farber, M.D. [facility]         DATE OF BIRTH:  1925/02/04   DATE OF PROCEDURE:  03/23/2004  DATE OF DISCHARGE:  03/25/2004                              CARDIAC CATHETERIZATION   PROCEDURE:  Left heart catheterization, left ventriculography, coronary  angiography, selective left renal angiography, AngioSeal closure of right  common femoral arteriotomy site.   INDICATIONS:  [name] [name] is a 77 year old lady with known coronary disease  status post CABG in October 2004.  She complains of two days of left arm  discomfort which has been exacerbated with movement.  However, today while  watching T.V. she had the sudden onset of palpitations and shortness of  breath as well as 9/10 substernal chest pressure with radiation to the left  arm.  She presented to Dr. Joyce Copa office.  She was found to be markedly  hypertensive with systolic blood pressure of 220 mmHg.  She was then  transferred to hospital and admitted.  Described her symptoms as similar to  those myocardial infarction.  She ruled out for myocardial infarction.  Electrocardiogram had demonstrated exacerbation of previous lateral ST  abnormalities.  She was then referred for diagnostic angiography.   PROCEDURAL TECHNIQUE:  Informed consent was obtained.  Under 1% lidocaine  local anesthesia a 6-French sheath was placed in the right common femoral  artery using the modified Seldinger technique.  Diagnostic angiography and  ventriculography were performed using JL4, JR4, and pigtail catheters.  The  pigtail catheter was then pulled back to the suprarenal abdominal aorta.  Abdominal aortography was performed by power injection.  This raised  question of left renal artery stenosis.  Selective bilateral renal  angiography was then performed by hand injection using a JR4 diagnostic  catheter.  Catheters were all then removed.  The arteriotomy site was then  closed using a 6-French AngioSeal device.  Complete hemostasis was obtained.  She was then transferred to the holding room in stable condition having  tolerated the procedure well.   COMPLICATIONS:  None.   FINDINGS:  1. LV 207/17/20.  EF 39% with diffuse hypokinesis.  2. No aortic stenosis.  3. 2+ mitral regurgitation.  4. Left main:  Angiographically normal.  5. LAD:  Moderate sized vessel giving rise to a single diagonal branch.     There is a proximal 70% stenosis.  There is then a focal 80% stenosis of     the mid vessel.  The diagonal has a 95% ostial stenosis.  The LIMA to LAD     is widely patent.  There is no left subclavian stenosis.  6. Circumflex:  Moderate sized vessel giving rise to a single obtuse     marginal.  There are only minor luminal  irregularities.  7. RCA:  The native vessel was previously demonstrated to be completely     occluded.  Therefore, angiography was performed only via the graft.  The     saphenous vein graft to the PDA is widely patent.  There is retrograde     filling of the PDA and filling of the posterior left ventricular branch.     There is no significant stenosis between these vessels.  There is a 95%     stenosis of the proximal portion of an acute marginal which fills in a     retrograde fashion from the graft.  The distal RCA has a 90% stenosis     prior to the bifurcation of the PDA and PLV.  8. Right renal artery:  Widely patent stents.  9. Left renal artery:  40-50% in-stent restenosis.   IMPRESSION/RECOMMENDATIONS:  Patient has widely patent bypass grafts.  Is  potentially ischemic in the zone supplied by the diagonal graft.  However,  the family states that she is extremely noncompliant with her medical  therapy.  This is probably the largest issue.  Her symptoms are most likely   precipitated by uncontrolled hypertension in the absence of compliance.  Will resume labetalol which has previously been effective in control of her  blood pressure.  Would consider initiation of Angiotensin receptor blocker  thereafter.                                               Salvadore Farber, M.D. [facility]    WED/MEDQ  D:  04/04/2004  T:  04/05/2004  Job:  [id number]   cc:   Delaney Meigs, M.D.  [address]  [place]  [place] [address]  Fax: [phone]   Rollene Rotunda, M.D.

## 2010-12-29 NOTE — H&P (Signed)
NAME:  [name] [name], [name] [name]               ACCOUNT NO.:  [id number]   MEDICAL RECORD NO.:  [id number]          PATIENT TYPE:  INP   LOCATION:  2620                         FACILITY:  [facility]   PHYSICIAN:  Learta Codding, M.D. LHCDATE OF BIRTH:  December 02, 1924   DATE OF ADMISSION:  09/20/2005  DATE OF DISCHARGE:                                HISTORY & PHYSICAL   PRIMARY CARDIOLOGIST:  Rollene Rotunda, M.D., [place], [place].   PRIMARY CARE PHYSICIAN:  Delaney Meigs, M.D., [place], [place].   CHIEF COMPLAINT:  This 77 year old white female with a prior history of  coronary artery disease, status post coronary artery bypass graft surgery,  who presents on transfer from [facility], after a presentation with  constant reproducible chest pain.   PROBLEMS:  1.  Chest pain/coronary artery disease      1.  Status post stenting of the LAD in 1999.      2.  Status post coronary artery bypass graft surgery x2 on June 11, 2003, with the LIMA to the LAD, a vein graft to the PDA.      3.  On March 23, 2004, a cardiac catheterization:  Left main normal,          LAD 70% proximal, 80% mid, diagonal with 95% ostial stenosis.  The          LIMA to the LAD was widely patent.  The left circumflex with minor          irregularities.  The RCA completely occluded.  The vein graft to the          PDA was widely patent.  There was a 95% lesion in the proximal          portion of the acute marginal.  The RCA had a 90% stenosis prior to          the bifurcation of the PA and PL.  2.  Hypertension.  3.  Hyperlipidemia.  4.  Diet-controlled type 2 diabetes mellitus.  5.  Bilateral renal artery stenosis, status post bilateral renal artery      stenting in November 2004.  6.  Carotid disease, status post right CEA in October 2004, done at the time      of the bypass surgery.  7.  Question of heparin-induced thrombocytopenia      1.  The patient actually had normal platelets during  the admission, but          this came into question.  8.  Degenerative disk disease.  9.  History of pseudo-aneurysm, status post repair, status post cardiac      catheterization in 1999.  10. CONTRAST DYE allergy.  11. Moderate mitral regurgitation.  12. Moderate aortic insufficiency.  13. Normal LV function by echocardiogram on September 12, 2004, with an      ejection fraction of 55%-65%.  14. Medical non-adherence.   HISTORY OF PRESENT ILLNESS:  This 77 year old female with a prior history as  outlined above.  Her last cardiac catheterization was in August  2005,  revealing widely patent grafts.  Since approximately 7 p.m. last night,  (September 19, 2005), she has experienced constant left-sided chest pain with  associated symptoms.  This afternoon at approximately 2 p.m. while  showering, she had an episode of dizziness and nausea, prompting her to  finally present to [facility].  Of note, her chest pain was not  worsened with exertion and is reproducible with palpation of the left chest.  At [facility] cardiac markers were negative and an  electrocardiogram showed no acute changes.  Given her history, it was felt  that she should be transferred to [facility] for a full  evaluation. Currently the patient continues to complain of mild left chest  pain, although appears comfortable.  Notably her blood pressure is markedly  elevated in the hospital to greater than 200 systolically and is currently  197 systolic.  She does have a history of medical non-adherence, but has  been taking labetalol and Norvasc apparently.  She has not been taking  Statin therapy, which was previously prescribed.   ALLERGIES:  QUESTIONABLE IV CONTRAST ALLERGY, MORPHINE AND ACE INHIBITOR in  the past, which cause a rash.   MEDICATIONS:  1.  Labetalol 200 mg b.i.d.  2.  Norvasc, unknown dose daily.   FAMILY HISTORY:  Mother died of liver cancer at age 4.  Father died  of  black lung at age 24.   SOCIAL HISTORY:  She lives in [place] by herself.  She has never smoked and  does not use alcohol.  She is active at home.  Eats a regular diet, although  is careful with sweets.   REVIEW OF SYSTEMS:  Positive for cough and a reproducible chest pain.  Positive for nausea and lightheadedness earlier today.  She does have diet-  controlled diabetes. All other systems are reviewed and are negative.   PHYSICAL EXAMINATION:  VITAL SIGNS:  Temperature 98.9 degrees, heart rate  65, respirations 18, blood pressure 197/72.  GENERAL:  Pleasant white female, in no acute distress, awake, alert and  oriented x3.  NECK:  No bruits or jugular venous distention.  LUNGS:  Respirations regular and unlabored.  Clear to auscultation.  HEART:  Regular S1 and S2.  No S3 or S4 or murmurs.  She does have left  chest tenderness with palpation,  ABDOMEN:  Round, soft, nontender, non-distended. Bowel sounds x4.  EXTREMITIES:  Warm and dry and pink.  No clubbing, cyanosis or edema.  Dorsalis pedis and posterior tibial pulses are __________ bilaterally.   A chest x-ray at an outside hospital shows moderately-progressive  cardiomegaly with minimal changes, secondary to chronic obstructive  pulmonary disease.   An electrocardiogram shows a sinus rhythm at a rate of 66, no acute ST-T  changes.   Hemoglobin 11.6, hematocrit 34.1, WBC 5.8, platelet s 298.  Sodium 136,  potassium 3.7, chloride 105, bicarbonate 25, BUN 18, creatinine 1.2, glucose  113.  Urinalysis shows many WBC's, positive nitrites, trace LE.   ASSESSMENT/PLAN:  1.  Atypical chest pain:  Cardiac markers at the outside hospital were      negative with no acute changes on the electrocardiogram.  Will continue      to cycle the cardiac markers.  The biggest issue at this point appears      to be her blood pressure.  Will place her on IV nitroglycerin, as well     as increase the labetalol to 400 mg b.i.d.  At  this point  we will not      use heparin, given her questionable history of heparin-induced      thrombocytopenia.  We tracked her platelets back to November of 2002,      when she supposed experienced this, and they were normal at that time.      We will add aspirin and Zocor therapy.  She previously took Lipitor but      stopped this, secondary to weakness.  Given her history of severe      atherosclerosis, she should certainly be on a Statin, so long as she can      tolerate it.  2.  Hypertension:  Her blood pressure is markedly elevated at this point.      We will titrate her labetalol and Norvasc as needed and for the time      being place her on IV nitroglycerin at 10 mcg per minute to titrate to a      systolic blood pressure of less than 150.  Should probably consider      diuretic therapy with hydrochlorothiazide.  Potentially will also      require Clonidine.  3.  Hyperlipidemia:  Will check lipids and liver function tests.  She was      previously on Lipitor, and this was discontinued by her.  Will add      Lipitor 40 mg q.h.s.  4.  History of bilateral renal artery stenosis:  It may be worth while if we      have difficulty managing her blood pressure, to recheck a renal      ultrasound.  Presumably this can be done as an outpatient.  5.  Type 2 diabetes mellitus:  Will add sliding scale insulin.  Her sugar on      her Chem-7 today was 113, and that was not fasting.  6.  Questionable history of heparin-induced thrombocytopenia:  Apparently      she had some heparin resistance during the stenting of her renal      arteries, but it does not appear in looking at her platelets, that she      ever actually experienced thrombocytopenia.  She apparently also had a      positive Hep panel at that time.  We will check platelet aggregation      studies to obtain a definitive answer.  7.  Carotid disease:  Will add aspirin and a Statin.  8.  Urinary tract infection:  This appears to be an  uncomplicated urinary      tract infection.  Will add Cipro for three days.      Ok Anis, NP      Learta Codding, M.D. [facility]  Electronically Signed    CRB/MEDQ  D:  09/20/2005  T:  09/21/2005  Job:  [id number]

## 2010-12-29 NOTE — Consult Note (Signed)
NAME:  [name] [name], [name] [name]                           ACCOUNT NO.:  [id number]   MEDICAL RECORD NO.:  [id number]                   PATIENT TYPE:  INP   LOCATION:  4733                                 FACILITY:  [facility]   PHYSICIAN:  Veneda Melter, M.D.                   DATE OF BIRTH:  May 08, 1925   DATE OF CONSULTATION:  06/21/2003  DATE OF DISCHARGE:                                   CONSULTATION   REASON FOR CONSULTATION:  The patient is a 77 year old white female whom we  were asked to assess for renal artery stenosis.   HISTORY OF PRESENT ILLNESS:  The patient has a history of dyslipidemia,  hypertension and diabetes mellitus, as well as advanced coronary artery  disease and cerebrovascular disease.  She recently underwent a cardiac  catheterization at [facility] on June 08, 2003,  showing three-vessel coronary artery disease and mild LV dysfunction.  She  also had severe right carotid occlusive disease.  She underwent a surgical  revascularization with a right carotid endarterectomy on June 11, 2003.  At the time of her cardiac catheterization, she was found to have severe  bilateral renal artery stenosis.  She did well following her discharge;  however, she was readmitted to [facility] on June 20, 2003, with chest discomfort and shortness of breath.  She was found to be  significantly volume-overloaded and hypertensive.  She has ruled out for an  acute myocardial infarction, and has symptomatic improvement with diuresis.  She is felt to have congestive heart failure and pulmonary edema, due to  hypertensive heart disease with diastolic dysfunction and volume overload,  and I am asked to assess for possible percutaneous intervention to the renal  arteries.   PAST MEDICAL HISTORY:  1. Coronary artery disease, status post coronary artery bypass graft surgery     on June 11, 2003, by Dr. Salvatore Decent. Cornelius Moras, with right carotid  endarterectomy by Dr. Di Kindle. Edilia Bo.  2. She is status post myocardial infarction in 1999, with a stent placement     in the left circumflex artery.  She had repair of an AV fistula at that     time by Dr. Balinda Quails.  3. Cerebrovascular disease, status post right carotid endarterectomy.  4. Hypertension.  5. Dyslipidemia.  6. Recent onset diabetes mellitus.   ALLERGIES:  No known drug allergies.   CURRENT MEDICATIONS:  1. Zocor 20 mg q.h.s.  2. Aspirin 325 mg daily.  3. Labetalol 400 mg b.i.d.  4. Clonidine TTS-3 patch q. Week.  5. Lasix 40 mg b.i.d.  6. K-Dur 20 mEq daily.   SOCIAL HISTORY:  The patient is widowed and lives with one of her sons in  [place].  She is a retired Production designer, theatre/television/film from an apartment complex.  She denies  alcohol or tobacco use.   FAMILY HISTORY:  There is a history of coronary artery disease in a son who  had a heart attack.  She has six children and 11 grandchildren and seven  great-grandchildren.   REVIEW OF SYSTEMS:  Notable for some weakness and lower extremity edema that  has improved since surgery, and mild orthopnea.   PHYSICAL EXAMINATION:  GENERAL:  A well-nourished, well-developed white  female, in no acute distress.  VITAL SIGNS:  Temperature 98.2 degrees, blood pressure 140/60, pulse 65,  respirations 20, O2 saturation 97% on 3 L per minute.  HEENT:  Pupils equal, round, reactive to light.  Extraocular muscles intact.  Oropharynx:  No lesions.  NECK:  Supple.  A left bruit is audible.  HEART:  A regular rate without murmurs.  LUNGS:  A few basilar crackles noted.  ABDOMEN:  Soft, nontender.  No bruits.  EXTREMITIES:  With 1+ pedal edema.  Right femoral pulse is 2+.  Left is 3+.  No bruits.  Distal pulses are diminished.  There is mild erythema over the  right medial thigh at the graft harvest site.  NEUROLOGIC:  Motor strength is 5/5.  Sensory is intact to touch.  An electrocardiogram shows a normal sinus rhythm at 60 beats per  minute.  There is T-wave inversion in the inferolateral leads, but no ST-segment  shifts.   LABORATORY DATA:  On June 21, 2003, a white count of 7.4, hemoglobin 9.5,  hematocrit 28.0, platelets 387.  Sodium 134, potassium 4.1, chloride 95,  bicarbonate 30, BUN 19, creatinine 1.3, glucose 116.  Liver function tests  within normal limits.  PT is 15.2, INR 1.3, PTT 42.  Beta natriuretic  peptide 1890.   ASSESSMENT/PLAN:  The patient is a 77 year old female with bilateral renal  artery stenosis, who presents with volume overload and resultant left  ventricular dysfunction.  The patient has a history of hypertension, poorly-  controlled, and has bilateral renal artery stenosis.  She does not appear to  have significant renal dysfunction by laboratory data.  Further assessment  of the renal mass by ultrasound would be beneficial.  I agree that  percutaneous intervention to her renal arteries would be of benefit for the  control of her hypertension and the long-term preservation of her renal  function, and for the management of her volume status.  The risks benefits and alternatives of percutaneous stent placement were  discussed with the patient, who understands and wishes to proceed.  This  will be arranged for her in the near future.  We will continue her diuresis  at this point, and I will start her on Plavix.                                               Veneda Melter, M.D.    NG/MEDQ  D:  06/21/2003  T:  06/21/2003  Job:  [id number]   cc:   Rollene Rotunda, M.D.   Joette Catching, M.D.

## 2010-12-29 NOTE — H&P (Signed)
NAME:  [name] [name], [name] [name]                           ACCOUNT NO.:  [id number]   MEDICAL RECORD NO.:  [id number]                   PATIENT TYPE:  INP   LOCATION:  2304                                 FACILITY:  [facility]   PHYSICIAN:  Olga Millers, M.D.                DATE OF BIRTH:  [birth year]   DATE OF ADMISSION:  06/08/2003  DATE OF DISCHARGE:                                HISTORY & PHYSICAL   HISTORY OF PRESENT ILLNESS:  [name] [name] is a 77 year old female with past  medical history of coronary disease and hypertension who presents with chest  pain.  The patient did present in 1999 with a myocardial infarction and  underwent PCI of her circumflex.  The procedure was complicated by a pseudo  aneurysm and AV fistula that required surgical repair.  She has done well  until approximately 8 p.m. last night when she developed substernal chest  pain that was described as a pressure.  The pain radiated to upper  extremities bilaterally.  It was associated with shortness of breath and  nausea but there was no diaphoresis.  There was a question increase with  cough.  She states the pain is identical to her symptoms with her myocardial  infarction.  On arrival to the ER she received sublingual nitroglycerin,  morphine, and Lopressor and her pain is now resolved.  Of note, her Toprol  was recently discontinued by her primary care physician secondary to a cough  by her report.  She was placed on Benicar.   ALLERGIES:  She has no known drug allergies.   MEDICATIONS:  1. Benicar 20 mg p.o. daily.  2. Aspirin daily.   SOCIAL HISTORY:  She does not smoke nor does she consume alcohol.   FAMILY HISTORY:  Negative for coronary artery disease.   PAST MEDICAL HISTORY:  1. Hypertension but she denies any diabetes mellitus or hyperlipidemia.  2. Coronary artery disease status post PCI of her circumflex.  3. Of note, catheterization in 1999 showed normal left main, 50% mid LAD,     100% occluded  circumflex and the RCA had multiple low grade lesions of     25, 30, and 50% distal.  She had an ejection fraction of 55% with     inferior and inferolateral akinesis.  She did have surgical repair of her     AV fistula and pseudo aneurysm previously.  There was no other past     medical history by report.   REVIEW OF SYSTEMS:  There is no headaches, fevers, or chills.  There is no  productive cough or hemoptysis.  There is no dysphagia, odynophagia, melena,  or hematochezia.  There is no history of hematuria.  There is no rash or  seizure activity.  There is no orthopnea, PND, or pedal edema but there is  dyspnea on exertion.  She does have a chronic cough.  The remaining  systems  are negative.   PHYSICAL EXAMINATION:  VITAL SIGNS:  Blood pressure 190/100, pulse 81.  GENERAL:  She is well-developed and well-nourished and does not appear to be  in acute distress.  SKIN:  Warm and dry.  She is not diaphoretic.  There is no peripheral  clubbing.  She did not appear to be depressed.  HEENT:  Unremarkable with normal eyelids.  NECK:  Supple with normal upstroke bilaterally.  There are bilateral carotid  bruits.  CHEST:  Clear to auscultation.  Normal expansion.  CARDIOVASCULAR:  Regular rate and rhythm with a normal S1 and S2.  There is  a 2/6 systolic murmur at the apex.  ABDOMEN:  Not tender.  Standard positive bowel sounds.  No  hepatosplenomegaly.  No mass appreciated.  There is no abdominal bruit.  She  has 2+ femoral pulses bilaterally.  I cannot appreciate bruits.  She also  has 2+ radial pulses bilaterally.  EXTREMITIES:  No edema and I can palpate no cords.  She has 2+ dorsalis  pedis pulses bilaterally.  NEUROLOGIC:  Grossly intact.   LABORATORIES:  Her electrocardiogram shows a normal sinus rhythm at a rate  81.  The axis is normal.  There is left ventricular hypertrophy and  inferolateral ST depression.  The QT is prolonged.   DIAGNOSES:  1. Chest pain/probable acute  coronary syndrome.  2. Hypertension.  3. Chronic cough.  4. Bilateral carotid bruits.   PLAN:  [name] [name] presents with chest pain that she stated is identical to  her pain with her previous infarct.  Her initial troponin is 0.08.  We will  admit to TTU and cycle enzymes.  We will treat with aspirin, heparin,  nitroglycerin, Lopressor, Altace, and Zocor.  The risks and benefits of  cardiac catheterization have been discussed with [name] [name] and she agrees to  proceed.  She also has bilateral carotid bruits.  We will need to check the  office records to see if she has had recent carotid Dopplers and this will  need to be repeated if not.                                                Olga Millers, M.D.    BC/MEDQ  D:  06/08/2003  T:  06/08/2003  Job:  [id number]

## 2010-12-29 NOTE — Discharge Summary (Signed)
NAME:  [name] [name], [name] [name]               ACCOUNT NO.:  [id number]   MEDICAL RECORD NO.:  [id number]          PATIENT TYPE:  INP   LOCATION:  2620                         FACILITY:  [facility]   PHYSICIAN:  Vida Roller, M.D.   DATE OF BIRTH:  [birth year]   DATE OF ADMISSION:  09/20/2005  DATE OF DISCHARGE:  09/22/2005                           DISCHARGE SUMMARY - REFERRING   DISCHARGE DIAGNOSES:  1.  Atypical chest discomfort.  2.  Urinary tract infection.  3.  Hypertension.  4.  Hyperlipidemia.  5.  Elevated TSH.  6.  History as below.   PRIMARY CARE PHYSICIAN:  Delaney Meigs, M.D.   SUMMARY OF HISTORY:  [name] [name] is a 77 year old female who developed left  chest discomfort on the evening prior to admission. She described this as a  heaviness with intermittent sharp discomfort lasting seconds at a time.  Intermittently she felt like she could not catch her breath and felt better  when she applied oxygen. At approximately 2 p.m. on the day of admission,  while starting to take a shower, she became dizzy and threw up. She called  EMS post episode. She states that the heaviness in her chest has been  constant since the day prior. She was transferred from [facility]  for further evaluation.   PAST MEDICAL HISTORY:  1.  Bypass surgery; last catheterization was in August 2005 which showed 2+      MR, native three-vessel coronary artery disease, grafts were patent to      the prior stent to the LAD, bilateral renal artery stenosis treated with      stenting in August 205.  2.  History of hypertension.  3.  Diabetes.  4.  DJD.  5.  Carotid endarterectomy.   LABORATORY DATA:  Chest x-ray on admission showed mild progressive  cardiomyopathy, stable minimal changes of COPD. Admission H&H  of 11.6 and  34.1, normal indices, platelet count 296,000, WBC 5.8. Subsequent  hematologies were unremarkable. Admission PTT 35, PT 13.7. Admission sodium  136, potassium 3.7, BUN 18,  creatinine 1.2, glucose 113. Subsequent  chemistries were unremarkable. CK-MB and troponins were negative times  three. Fasting lipids on the 9th showed total cholesterol of 212,  triglycerides 153, HDL 37, LDL 144. TSH was slightly elevated at 6.005.  Urinalysis obtained was positive for nitrites, leukocytes, and revealed many  bacteria.   HOSPITAL COURSE:  [name] [name] was transferred from [facility] for  further evaluation. She was hypertensive on admission, thus the labetalol  was increased. It was felt that her discomfort was very atypical in nature.  She ruled out for myocardial infarction and she was begun on antibiotics for  urinary tract infection. By September 22, 2005, she was ambulating without  difficulty, Foley was discontinued, and it was felt that she could be  discharged home with an outpatient follow-up with Dr. Lysbeth Galas. In regards of  her history of coronary artery disease and atypical chest discomfort, the  office will call her at home with arrangements for an adenosine Myoview and  then a follow-up appointment with Dr. Antoine Poche.  DISPOSITION:  She is discharged home. Asked to maintain a low-salt, low-fat,  low-cholesterol diet. She is sent home on Cipro 250 mg b.i.d. for five days.  She was asked to continue on her Norvasc 10 mg daily and labetalol 400 mg  b.i.d. She was also asked to begin an aspirin 325 mg daily. She is advised  to fall Dr. Lysbeth Galas for a two-week appointment to follow up on a urinary  tract infection. The office will call her with arrangements for an adenosine  Myoview and a follow-up appointment with Dr.  Antoine Poche. Consideration at the  time of follow-up with Dr. Antoine Poche would be to review her blood pressure  diary that I have asked her to begin and consider treatment for her  hyperlipidemia. Consideration should also be given to beginning an ACE  inhibitor given her history of diabetes. Will have Dr. Lysbeth Galas follow up to  assure glucose  control.   DISCHARGE TIME:  Less than 30 minutes.      Joellyn Rued, P.A. [facility]      Vida Roller, M.D.  Electronically Signed    EW/MEDQ  D:  09/22/2005  T:  09/22/2005  Job:  [id number]

## 2010-12-29 NOTE — Op Note (Signed)
NAME:  [name] [name], [name] [name]                           ACCOUNT NO.:  [id number]   MEDICAL RECORD NO.:  [id number]                   PATIENT TYPE:  INP   LOCATION:  2304                                 FACILITY:  [facility]   PHYSICIAN:  Salvatore Decent. Cornelius Moras, M.D.              DATE OF BIRTH:  11-17-1924   DATE OF PROCEDURE:  06/11/2003  DATE OF DISCHARGE:                                 OPERATIVE REPORT   PREOPERATIVE DIAGNOSIS:  Severe 2-vessel coronary artery disease with class  4 unstable angina, severe bilateral internal carotid artery stenosis with  greater than 80% stenosis of right internal carotid artery.   POSTOPERATIVE DIAGNOSIS:  Severe 2-vessel coronary artery disease with class  4 unstable angina, severe bilateral internal carotid artery stenosis with  greater than 80% stenosis of right internal carotid artery.   PROCEDURES:  1. Right carotid endarterectomy with patch angioplasty of the right internal     carotid artery.  2. Median sternotomy for coronary artery bypass grafting x2 using endoscopic     vein harvest from the left thigh (left internal mammary artery to the     distal left anterior descending coronary artery, saphenous vein graft to     the posterior descending coronary artery).   SURGEONS:  1. Larina Earthly, M.D. with Toribio Harbour, N.P. as the assistant.  2. Salvatore Decent. Cornelius Moras, M.D. with Carmin Muskrat. Eustaquio Boyden. as the assistant.   ANESTHESIA:  General.   INDICATIONS FOR PROCEDURE:  The patient is a12 year old female followed  by  Dr. Joette Catching with a known history of coronary artery disease and  hypertension. She presents with progressive symptoms of exertional shortness  of breath and new onset symptoms of chest pain consistent with class 4  angina. She was admitted to the hospital  and ruled out for acute myocardial  infarction. Cardiac catheterization performed by Dr. Charlies Constable  demonstrated severe  2-vessel coronary artery disease with mild  left  ventricular dysfunction and  moderate mitral regurgitation. A transthoracic echocardiogram confirmed the  presence of very mild left ventricular dysfunction with moderate aortic  insufficiency and only mild mitral regurgitation. Preoperative carotid  duplex scan demonstrated severe bilateral internal carotid artery stenosis  with greater than 80% stenosis of the right internal carotid artery and 60%  to 80% stenosis of the left internal carotid artery. A full consultation  note has been dictated previously.   Operative consent:  The patient and her family have been counseled at length  regarding the indications, risks and potential benefits of coronary artery  bypass grafting. The rationale for concomitant carotid endarterectomy has  been reviewed and the patient has been fully evaluated in consultation by  one of the vascular surgeons, Dr. Cari Caraway. The patient and her family  also understand the plans for intraoperative transesophageal echocardiogram  to further evaluate the functional significance of her underlying aortic  valve and mitral valve  disease. They understand the need for the possibly of  need for mitral valve repair depending on intraoperative findings. All of  their questions have been addressed. They understand accept all of the  associated risks of surgery and desire to proceed as described.   DESCRIPTION OF PROCEDURE:  The patient was brought to the operating room on  the above mentioned date and central monitoring is established by the  anesthesia service under the care and direction of Dr. Guadalupe Maple.  Specifically a Swann-Ganz catheter is placed through the left internal  jugular approach. A radial arterial line is placed. Intravenous antibiotics  are administered. Following  induction with general endotracheal anesthesia  a Foley catheter is placed.   Baseline transesophageal echocardiogram  is performed by Dr. Noreene Larsson. This  demonstrated the presence of  relatively well preserved left ventricular  function with mild inferior  wall hypokinesis. There is moderate (2+) aortic  insufficiency. The aortic valve is tricuspid in all 3 leaflets. It moved  reasonably well, although there is some area of sclerosis on one of the  cusps. There is moderate (2+) mitral regurgitation. This is a central jet of  mitral regurgitation which is relatively  small  in diameter with a  relatively narrow jet that extends half to 2/3rds of the way across the left  atrium. The left atrium is not enlarged. There is no sign of mitral valve  prolapse. There is perhaps mild restriction of  the posterior  leaflet of  the mitral valve. There is no flow reversal in the pulmonary  veins. No  other abnormalities are appreciated.   A telephone discussion was performed with Dr. Rollene Rotunda and it was  mutually agreed that based upon intraoperative transesophageal  echocardiogram  findings and the patient's preoperative  presentation, the  need for concomitant mitral valve repair is probably not justified. The  patient's right neck, chest, abdomen, both groins and both lower extremities  are prepped and draped in a sterile manner.   A right carotid endarterectomy with patch angioplasty of the right internal  carotid artery is performed by Dr. Tawanna Cooler Early with Ms. Maxwell Marion  assisting. The details of this operative procedure are to be dictated  separately by Dr. Arbie Cookey. After completion of  this procedure the right neck  incision is packed with a moistened sterile gauze and the skin is gently  reapproximated with several interrupted silk sutures.    A median sternotomy incision is performed and the left internal mammary  artery is dissected from the chest wall and prepared for bypass grafting.  The left internal mammary artery is a good quality conduit. Simultaneously  saphenous vein is obtained from the patient's left thigh using endoscopic vein harvest technique.  Initially a surgical incision is made in the  patient's right thigh, but the saphenous vein cannot be located here. The  saphenous vein is subsequently procured from the left thigh as described.  The saphenous vein is notably a good quality conduit .The patient is fully  heparinized systemically.   The pericardium is opened. The ascending aorta is normal in appearance.  There is biventricular enlargement which is mild to moderate in  severity.  The ascending aorta and the right atrium are cannulated for cardiopulmonary  bypass. A retrograde cardioplegia catheter is placed through the right  atrium and an attempt is made to place it into the coronary sinus. However,  due to the patient's very fragile tissues, a small  hole in the coronary  sinus prewall is made posteriorly, and this is recognized at the time. The  coronary sinus catheter is promptly removed.   Cardiopulmonary bypass is begun and the surface of the heart  is inspected.  Distal sites are selected for coronary artery bypass grafting. Portions of  saphenous vein and the left internal mammary artery are trimmed to the  appropriate length. A temperature probe is placed in the left ventricular  septum. A cardioplegia catheter is placed in the ascending aorta.   The patient is allowed to cool to 32 degrees systemic temperature. The  aortic cross clamp is applied and cardioplegia is delivered in an antegrade  fashion through the aortic root. Iced saline slush is applied for topical  hypothermia. The initial cardioplegic arrest and myocardial cooling are felt  to be excellent. Care is taken to avoid any dilatation of the left  ventricular chamber due to the presence of aortic insufficiency. Repeat  doses of cardioplegia are administered intermittently through the cross  clamp portion of the operation, both through the aortic root and down the  subsequently placed vein graft to maintain septal temperature  below 15  degrees  centigrade.   The small hole in the coronary sinus is exposed and inspected. This was  closed using a small  patch of the patient's autologous pericardium which is  secured over the top of the hole using running 4-0 Prolene suture. The right  atrium is briefly allowed to fill to test the patency of the closure and  there is no sign of  leak or any impingement of the coronary sinus.   The following distal  coronary anastomoses are performed:  1. The posterior descending coronary artery is grafted with a saphenous vein     graft in an end-to-side fashion. This coronary measures 1.2 mm in     diameter and is of fair to poor quality. It is diffusely diseased.  2. The distal left anterior descending coronary artery is grafted with the     left internal mammary artery in an end-to-side fashion. This coronary     measures 1.5 mm in diameter and is of good quality.  3. The single proximal saphenous vein anastomosis is performed directly to    the ascending aorta prior to removal  of the aortic cross clamp. All air     is evacuated from the aortic root. The septal temperature  is noted to     rise rapidly upon reperfusion of the left internal mammary artery. The     aortic cross clamp is removed after a total cross clamp  time of 48     minutes.   The heart begins to beat spontaneously without need for cardioversion. All  proximal and distal anastomoses were  inspected for hemostasis and  appropriate graft  orientation. Epicardial pacing wires are affixed to the  right ventricular outflow tract into the right atrial appendage. A low-dose  Dopamine infusion is begun. The patient was weaned from cardiopulmonary  bypass without difficulty. The patient's rhythm at separation from bypass is  slow junctional rhythm with 3rd degree AV block. AV  sequential pacing is  employed. Normal sinus rhythm resumed spontaneously prior to completion of  the operation. Total cardiopulmonary bypass time for the  operation was 68  minutes.   Follow up transesophageal echocardiogram performed by Dr. Noreene Larsson after  separation from bypass demonstrated initially asynchronous contraction of  the left ventricle related to dual chamber AV  sequential pacing. Associated  with this there  is slight exacerbation of the mitral regurgitation, but this  resolves as normal sinus rhythm resumes spontaneously. The patient is placed  on milrinone infusion. The patient remains hemodynamically stable throughout  the remainder of the option. No other abnormalities are appreciated on  transesophageal echocardiogram.   The venous and arterial cannulae were removed uneventfully. Protamine was  administered to reverse the anticoagulation. The mediastinum and the left  chest are irrigated with saline solution containing vancomycin. Meticulous  surgical hemostasis is ascertained. The mediastinum and the left chest are  drained with 3 chest tubes placed through separate stab incisions  inferiorly. The median sternotomy is closed in a routine fashion. The lower  extremity incisions are closed in multiple layers in a routine fashion. All  skin incisions are closed with subcuticular skin closures.   The right neck  incision is now reopened and inspected for hemostasis. The  right neck  wound is drained with a 10 French Jackson-Pratt drain exited  through a separate stab incision inferiorly. The surgical incision is now  closed by reapproximating the platysma muscle with running absorbable  suture. The skin is closed with a subcuticular skin closure.   The patient tolerated the procedure well and was transported to the surgical  intensive care unit in stable condition. There were no intraoperative  complications. All sponge, instrument and needle counts were verified  correct at the completion of the operation. The patient was transfused 3  units of packed red blood cells during cardiopulmonary bypass due to anemia. The  patient was transfused one 10 pack of adult platelets due to  thrombocytopenia after separation from bypass.                                                   Salvatore Decent. Cornelius Moras, M.D.    CHO/MEDQ  D:  06/11/2003  T:  06/11/2003  Job:  [id number]   cc:   Rollene Rotunda, M.D.   Charlies Constable, M.D.   Delaney Meigs, M.D.  [address]  [place]  [place] [address]  Fax: [phone]

## 2010-12-29 NOTE — H&P (Signed)
NAME:  [name] [name]                           ACCOUNT NO.:  [id number]   MEDICAL RECORD NO.:  [id number]                   PATIENT TYPE:  EMS   LOCATION:  MAJO                                 FACILITY:  [facility]   PHYSICIAN:  [name] [name], M.D.                DATE OF BIRTH:  1925-07-21   DATE OF ADMISSION:  06/20/2003  DATE OF DISCHARGE:                                HISTORY & PHYSICAL   CHIEF COMPLAINT:  Shortness of breath.   HISTORY OF PRESENT ILLNESS:  The patient is a 77 year old, pleasant female  recently discharged from [facility] on June 20, 2003 after  having chest pain and acute coronary syndrome. During this hospitalization a  catheterization showed multivessel disease and she underwent a CABG and  carotid endarterectomy with Dr. Barry [name] from CT surgery.  She now presents  with worsening shortness of breath since her discharge.  The chest pain that  she has been having is similar to her angina but in the upper abdomen.  She  further reports orthopnea, lower extremity edema and cough but no fevers,  chills, nausea, vomiting, etc.  She does note that her weight is slightly  up.  She did walk at home but this has been more and more difficult.   PAST MEDICAL HISTORY:  1. Coronary artery disease status post CABG on June 11, 2003 with a right     carotid endarterectomies.  She had a LIMA to the LAD and an SVG to the     PDA.  The patient also has had a history of a myocardial infarction in     1999 and had a stent to the circumflex.  2. Coronary artery disease with 80% stenosis on the right, 60 to 79%     stenosis on the left.  3. Hypertension.  4. Hyperlipidemia.  5. Diabetes mellitus.  6. 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  7. Moderate mitral regurgitation.  8. Renal artery stenosis bilaterally.  She is pending stent placement for     this issue.   ALLERGIES:  No known drug allergies.   MEDICATIONS:  1. Zocor 20 mg q.h.s.  2. Aspirin 325 mg daily.  3. Labetalol 400 mg b.i.d.  4. Clonidine patch q.week.  5. Lasix 40 mg b.i.d. x10 day and then daily.  6. K-Dur 20 mEq daily.   SOCIAL HISTORY:  She is a single woman with six children, 11 grandchildren,  seven great-grandchildren.  No tobacco, no alcohol.   FAMILY HISTORY:  She has multiple maternal uncles who had coronary disease  in their 74's and 3's.   Review of systems is otherwise negative except as delineated in the  HPI.   PHYSICAL EXAMINATION:  VITAL SIGNS:  Blood pressure is 178/79, respirations  20, pulse 62, temperature is afebrile.  She is saturating 97% on two liters.  GENERAL:  She is a white female in no apparent distress.  HEENT:  Pupils are equal, round and reactive to light and accommodation.  EOMI.  NECK:  She has JVD to 13 cm, no lymphadenopathy, no thyromegaly, no bruits.  CARDIOVASCULAR:  Regular rate and rhythm with a 1/6 systolic ejection murmur  at the left lower sternal border and apex.  LUNGS:  Right greater than left crackles bilaterally with equal breath  sounds.  SKIN:  Wound sites are clean, dry and intact of the sternum and right neck  region.  ABDOMEN:  Has normal bowel sounds, positive distention.  No  hepatosplenomegaly.  EXTREMITIES:  No clubbing, cyanosis, she has 2+ lower extremity edema.  NEUROLOGIC:  Exam is intact.   Chest x-ray shows pulmonary edema.  EKG normal sinus rhythm rate of 76, axis  is normal.  She has nonspecific inferior T wave inversion.  White count  7400, hemoglobin 9.5, hematocrit 28, platelets 387,000.  Sodium 134,  potassium 4.1, chloride 95, bicarb 30, BUN 19, creatinine 1.3, glucose 116.  CK 36, MB 1.4, troponin less than 0.05. BNP is 1890.  Urinalysis is  negative.  PTT is 42, INR is 1.3.   ASSESSMENT AND PLAN:  The patient is a 77 year old status post coronary  artery bypass grafting and right carotid endarterectomy on June 11, 2003  with a  history of coronary artery disease, hypertension, hyperlipidemia,  renal artery stenosis, diabetes, moderate mitral regurgitation who presents  with significant congestive heart failure.  Her ejection fraction was  previously close to normal but it seems that her hypertension and renal  artery stenosis are playing a major role in her current symptomatology in  volume overload.  She has otherwise been doing well after her surgery and  her wound sites appear to be within normal limits.  We will admit her and  diurese her.  In the emergency room she has had over two liters out with 80  mg of Lasix with significant symptomatic improvement.  We will obtain an  echo in the morning.  I will add hydralazine to her medical regimen and  continue her other medications.  Depending on her echo results and her  symptomatic improvement we will consider further studies.  She will  hopefully have her renal artery stenting performed as scheduled in the near  future.      [name] [name], M.D. [facility]                [name] [name], M.D.    DWM/MEDQ  D:  06/21/2003  T:  06/21/2003  Job:  [id number]

## 2010-12-29 NOTE — Op Note (Signed)
NAME:  [name] [name], [name] [name]                           ACCOUNT NO.:  [id number]   MEDICAL RECORD NO.:  [id number]                   PATIENT TYPE:  INP   LOCATION:  4713                                 FACILITY:  [facility]   PHYSICIAN:  Salvadore Farber, M.D.             DATE OF BIRTH:  [birth year]   DATE OF PROCEDURE:  06/23/2003  DATE OF DISCHARGE:                                 OPERATIVE REPORT   PROCEDURE PERFORMED:  Bilateral renal artery stenting.   INDICATIONS FOR PROCEDURE:  [name] is a 77 year old lady status post  coronary artery bypass grafting and right carotid endarterectomy on June 07, 2003.  At cardiac catheterization prior to surgery, she was noted to  have severe bilateral renal artery stenosis.  She was readmitted to hospital  on June 21, 2003 with congestive heart failure and blood pressure 178/79  on large doses of three medications.  Creatinine has risen from 1.3 to 1.8  and has been stable at 1.8 over the past 24 hours.  Her renal artery  stenosis appears to be limiting our ability to diurese her with attempts at  diuresis resulting in worsening renal function.  Decision was therefore made  to proceed with renal artery stenting.  She was pretreated with sodium  bicarbonate for prophylaxis for contrast nephropathy.   DESCRIPTION OF PROCEDURE:  Informed consent was obtained.  The patient had  been pretreated with sodium bicarbonate for contrast nephropathy  prophylaxis.  She was also pretreated with Plavix 600 mg greater than six  hours prior to the procedure.  Under 1% lidocaine local anesthesia, a 6  French sheath was placed in the left femoral artery using a modified  Seldinger technique.  Heparin was given in an attempt to provide  anticoagulation.  However, despite a total of 6600 units of heparin, her ACT  could not be raised above 224 and appeared to fall rapidly after boluses.  Given her recent heparin exposure during her hospitalization for acute  coronary syndrome and subsequent coronary artery bypass grafting, concern  was raised for heparin induced thrombocytopenia.  Therefore, no further  heparin was given.  Flushes were all changed to normal saline.  Anticoagulation was initiated with Angiomax.  HIT antibody, ELIZA was sent  from the lab.   A 5 French pigtail was advanced to the suprarenal abdominal aorta.  Aortography was performed by power injection.  Attention was turned first to  the left renal artery.  Using no touch technique, the artery was wired with  a stabilizer wire via a 6 [place] JR 4 guide.  Angiography was performed by  hand injection.  The lesion was predilated using a 5.0 x 15 mm Viatrac  balloon at 8 atmospheres.  It was then stented using a 5 x 15 mm Genesis on  Aviator at 8 atmospheres.  Final angiogram demonstrated no residual  stenosis.  On both pre and post angiograms of  the left kidney, there  appeared to be pruning of the vessel suggestive of chronic ischemic  nephropathy.  The kidney was treated because despite poor function due to  the nephropathy, it certainly could be a substantial contributor of renin  leading to heart failure.   Attention was then turned to the right renal artery.  Again using no touch  technique, a stabilizer wire was advanced into the lesion.  Attempt was made  to directly stent the lesion using a 6.0 x 12 mm Genesis on Aviator at 8  atmospheres; however, during balloon inflation the stent watermelon seeded  forward, missing the proximal portion of the lesion.  Therefore, a second  stent (6.0 x 12 mm Herculink plus) was deployed at 10 atmospheres so as to  cover the ostium of the vessel and overlap the proximal portion of the  previously placed stent.  The region of overlap between the two stents was  post dilated at 14 atmospheres using the Herculink stent balloon.  Final  angiogram demonstrated approximately 10% residual stenosis and a normal  nephrogram. Angiomax was stopped  at conclusion of the procedure.  The  patient was transferred to the holding room in stable condition, having  tolerated the procedure well.   IMPRESSION/RECOMMENDATION:  Successful stenting of bilateral renal arteries.  Plavix will be continued for 30 days.  Aspirin will be continued  indefinitely.   The patient was very resistant to heparin, raising question of heparin  induced thrombocytopenia.  HIT ELIZA was sent.  Anticoagulation was switched  to bivalirudin.  Bivalirudin will be resumed four hours after her sheath is  removed pending the ELIZA result.  She will be continued on sodium  bicarbonate protocol for prevention of contrast nephropathy.                                               Salvadore Farber, M.D.    WED/MEDQ  D:  06/23/2003  T:  06/24/2003  Job:  [id number]   cc:   Salvatore Decent. Cornelius Moras, M.D.  [address]  [place]  [place] [address]   Rollene Rotunda, M.D.   Delaney Meigs, M.D.  [address]  [place]  [place] [address]  Fax: [phone]

## 2010-12-29 NOTE — Discharge Summary (Signed)
NAME:  [name] [name], [name] [name]                           ACCOUNT NO.:  [id number]   MEDICAL RECORD NO.:  [id number]                   PATIENT TYPE:  INP   LOCATION:  2020                                 FACILITY:  [facility]   PHYSICIAN:  Larina Earthly, M.D.                 DATE OF BIRTH:  06-14-1925   DATE OF ADMISSION:  06/08/2003  DATE OF DISCHARGE:  06/20/2003                                 DISCHARGE SUMMARY   ADMISSION DIAGNOSIS:  Acute coronary syndrome.   DISCHARGE DIAGNOSES:  1. History of myocardial infarction, status post percutaneous coronary     intervention of the circumflex in 1999.  2. History of hypertension.  3. History of coronary artery disease, status post coronary artery bypass     grafting on June 11, 2003.  4. Newly diagnosed borderline diabetes mellitus, type 2, and hyperlipidemia.  5. Bilateral renal artery stenosis.  6. Postoperative mild renal insufficiency.   HOSPITAL PROCEDURES:  1. Cardiac catheterization on June 08, 2003.  2. CT scan of the chest on June 08, 2003, for evaluation of a widened     mediastinum on chest x-ray.  The CT scan was negative for aortic     dissection and negative for pulmonary embolus.  It showed mild     cardiomegaly.  No discrete infiltrates, effusions, or adenopathy.  3. Carotid Dopplers completed on June 09, 2003, revealed greater than 80%     right internal carotid artery stenosis and a 60-80% left internal carotid     artery stenosis.  4. 2-D echocardiogram completed on June 09, 2003, was notable for the     presence of mild mitral regurgitation and mild to moderate aortic     insufficiency.  Left ventricular function was mildly reduced and no other     significant abnormalities were appreciated.  5. Coronary artery bypass grafting x 2 in conjunction with a right carotid     endarterectomy with Dacron patch angioplasty completed on June 11, 2003.   CONSULTATIONS:  1. Cardiac rehabilitation.  2. Case  management.  3. Diabetes management.  4. Vascular surgery.   HISTORY OF PRESENT ILLNESS:  [name] [name] is a 77 year old female who has a  past medical history positive for coronary artery disease and hypertension.  She presented on June 08, 2003, with chest pain.  The patient did have a  history of myocardial infarction in 1999 and underwent PCI of her  circumflex.  The patient has been doing quite well since then until  approximately 8 p.m. on June 07, 2003, when she developed substernal  chest pain that was described as pressure.  The pain radiated to her upper  extremities bilaterally.  It was also associated with shortness of breath  and nausea, but there was no diaphoresis.  There was a question of  increasing cough.  She stated that the pain was identical to her  symptoms  with her prior myocardial infarction.  On arrival to the emergency room, the  patient received sublingual nitroglycerin, morphine, and Lopressor and her  pain was resolved at the time of presentation.  We decided at that time that  the patient should be admitted to [facility] for further  evaluation.   HOSPITAL COURSE:  [name] [name] was then admitted to [facility] on  June 08, 2003, and underwent cardiac catheterization.  This revealed  severe two-vessel coronary artery disease with 90% proximal stenosis in the  left anterior descending coronary artery followed by 70-80% tandem lesions  of the mid portion of this vessel.  The left circumflex coronary artery  remained widely patent at the site of the previous percutaneous coronary  intervention.  There was 100% proximal occlusion the right coronary artery  with left-to-right collateral filling.  There was inferior wall hypokinesis  with overall ejection fraction estimated at 40-50%.  There was evidence of  mild to moderate mitral regurgitation.  A CVTS consult was initiated at that  time for evaluation for surgical intervention.  Dr. Cornelius Moras  responded to the  consultation in an appropriate manner.  His impression was that the patient  had severe two-vessel coronary artery disease with coronary anatomy not  favorable for percutaneous coronary intervention.  The patient also had  class I stable angina with progressive symptoms of exertional shortness of  breath.  It was decided that the patient would benefit from coronary artery  bypass grafting.   In preparation for cardiac surgery, the patient underwent bilateral carotid  duplex studies.  This revealed a greater than 80% right internal carotid  artery stenosis and a 60-80% left internal carotid artery stenosis.  The  patient also underwent 2-D echocardiogram to evaluate further evidence of  mitral regurgitation on cardiac catheterization.  This revealed the presence  of mild mitral regurgitation and mild to moderate aortic insufficiency.  The  patient's left ventricular function was mildly reduced.  There were no other  significant abnormalities appreciated.  A vascular surgery consult was  initiated for the event of severe internal carotid artery stenosis and a  question of whether the patient should undergo carotid endarterectomy in  conjunction with planned coronary artery bypass surgery.   Dr. Edilia Bo responded appropriately to the vascular surgical consult.  His  impression was that the patient did have severe internal carotid artery  stenosis and would benefit from a right carotid endarterectomy combined with  her CABG in order to lower her risk of perioperative stroke.  The  indications of the procedure and the potential complications were discussed  with the patient and her family at that time.  They agreed to proceed with  the surgery.  The plan was then made for Dr. Arbie Cookey to complete a right  carotid endarterectomy in combination with Dr. Orvan July coronary artery bypass grafting and possible mitral valve replacement on June 11, 2003.   The patient was taken to  the operating room on June 11, 2003, and  underwent a right carotid endarterectomy with Dacron patch angioplasty.  She  also underwent coronary artery bypass grafting x 2 utilizing the left  internal mammary artery to the left anterior descending and a saphenous vein  graft to the posterior descending artery.  An intraoperative transesophageal  echocardiogram was completed, which revealed 2+ aortic insufficiency and 2+  mitral regurgitation.  The decision was made at that time that the patient  would not benefit greatly from mitral valve replacement.  The patient  tolerated the procedures well and was transferred to the surgical intensive  care unit in critical, but stable condition.   The patient had some labile blood pressure overnight and on postoperative  day #1 it was decided for her to remain intubated and sedated until the  blood pressure stabilized.  The patient was ultimately extubated later in  the evening of postoperative day #1.  She tolerated this well and was slowly  weaned from the dopamine and milrinone drip as well.  The patient was  neurologically intact and followed commands appropriately.   Over the next several days, the patient continued to have a problem with  hypertension.  Her antihypertensive medications were adjusted appropriately.  The patient also showed a slight elevated in creatinine.  Her urine output  remained appropriate.  Ultimately her creatinine trended towards normal.  The patient's blood pressure medications were slowly increased and adjusted  until her blood pressure had stabilized in the 140s-150s systolic.  The  patient also had some difficulty postoperatively with respiratory status.  She was given albuterol and flutter valve for further pulmonary management.  Ultimately, the patient was weaned from her oxygen and was saturating in the  low to mid 90s on room air.  The patient slowly increased her activity with  cardiac rehabilitation and was  able to ambulate greater than 200 feet on  room air with some assistance.  Overall, Ms. Arroyave continued to make slow and  steady progress throughout the next several hospital days.  On postoperative  day #9 or June 20, 2003, the patient was deemed appropriate for discharge  to home.  She stated that she had been feeling better than she had yet in  her hospitalization.  She denied any chest pain, shortness of breath, or  palpitations.  She was afebrile, her blood pressure was stable at 155/82,  and her pulse was 55 and regular.  She was in normal sinus rhythm on  telemetry.  She was saturating 90-96% on room air.  The patient's weight was  148 pounds, which continued to be approximately 10 pounds above preoperative  weight.  The patient's BUN and creatinine had stabilized at 19 and 1.3,  respectively.  The patient's lungs were clear to auscultation throughout. Her abdomen was soft and nontender with active bowel sounds.  Her  extremities revealed 1+ edema bilaterally to the mid shin, although they  were warm with 2+ dorsalis pedis pulses.  Her sternum was stable and her  incisions were clean, dry, and intact without evidence of erythema or  drainage.  The patient had been seen by the case management team, at which  time they had planned for home health R.N., as well as home health aid to  follow up with the patient at the time of discharge.  The patient was in  improved and stable condition on the day of discharge, June 20, 2003.   DISCHARGE MEDICATIONS:  1. Zocor 20 mg p.o. daily.  2. Aspirin 325 mg p.o. daily.  3. Labetalol 200 mg two tablets p.o. b.i.d.  4. Clonidine patch TTS-3 transdermal to be changed every seven days.  5. Lasix 40 mg one tablet p.o. twice daily for 10 days and then one tablet     daily.  6. K-Dur 20 mEq one tablet daily x 30 days.  7. Ultram 50 mg one to two tablets every four hours as needed for pain.   ALLERGIES:  No known drug allergies.   ACTIVITY:  No  driving.  No heavy lifting or strenuous activity.  Walk daily.   DIET:  The patient should follow a balanced, low-fat, low-salt diet.   WOUND CARE:  The patient will be seen by home health nurse for evaluation of  wounds and other clinical care.  The patient is to notify the CVTS office if  she has any increased redness, drainage, or swelling from the incisions  without has a fever of greater than 101.0 degrees.   FOLLOWUP APPOINTMENTS:  1. The patient is to see Dr. Cornelius Moras on July 05, 2003, at 12:30 p.m.  She     is to bring her chest x-ray from the cardiology appointment with her.  2. The patient is to see Dr. Arbie Cookey on July 15, 2003, at 9 a.m.  3. The patient is to follow up with cardiology in two weeks with a chest x-     ray.  They will call her with the appointment date and time.      Carolyn A. Eustaquio Boyden.                  Larina Earthly, M.D.    CAF/MEDQ  D:  08/18/2003  T:  08/19/2003  Job:  [id number]   cc:   Salvatore Decent. Cornelius Moras, M.D.  [address]  [place]  [place] [address]   Olga Millers, M.D.   Rollene Rotunda, M.D.   Delaney Meigs, M.D.  [address]  [place]  [place] [address]  Fax: [phone]

## 2011-03-23 ENCOUNTER — Encounter: Payer: Self-pay | Admitting: Cardiology

## 2011-06-08 ENCOUNTER — Emergency Department (HOSPITAL_COMMUNITY): Payer: Medicare Other

## 2011-06-08 ENCOUNTER — Inpatient Hospital Stay (HOSPITAL_COMMUNITY)
Admission: EM | Admit: 2011-06-08 | Discharge: 2011-06-14 | DRG: 292 | Disposition: A | Discharge status: Skilled Nursing Facility | Payer: Medicare Other | Attending: Internal Medicine | Admitting: Internal Medicine

## 2011-06-08 DIAGNOSIS — R112 Nausea with vomiting, unspecified: Secondary | ICD-10-CM | POA: Diagnosis not present

## 2011-06-08 DIAGNOSIS — Z91041 Radiographic dye allergy status: Secondary | ICD-10-CM

## 2011-06-08 DIAGNOSIS — I5043 Acute on chronic combined systolic (congestive) and diastolic (congestive) heart failure: Principal | ICD-10-CM | POA: Diagnosis present

## 2011-06-08 DIAGNOSIS — I129 Hypertensive chronic kidney disease with stage 1 through stage 4 chronic kidney disease, or unspecified chronic kidney disease: Secondary | ICD-10-CM | POA: Diagnosis present

## 2011-06-08 DIAGNOSIS — J9610 Chronic respiratory failure, unspecified whether with hypoxia or hypercapnia: Secondary | ICD-10-CM | POA: Diagnosis present

## 2011-06-08 DIAGNOSIS — J4489 Other specified chronic obstructive pulmonary disease: Secondary | ICD-10-CM | POA: Diagnosis present

## 2011-06-08 DIAGNOSIS — E119 Type 2 diabetes mellitus without complications: Secondary | ICD-10-CM | POA: Diagnosis present

## 2011-06-08 DIAGNOSIS — Z7901 Long term (current) use of anticoagulants: Secondary | ICD-10-CM

## 2011-06-08 DIAGNOSIS — Z66 Do not resuscitate: Secondary | ICD-10-CM | POA: Diagnosis present

## 2011-06-08 DIAGNOSIS — N183 Chronic kidney disease, stage 3 unspecified: Secondary | ICD-10-CM | POA: Diagnosis present

## 2011-06-08 DIAGNOSIS — I509 Heart failure, unspecified: Secondary | ICD-10-CM | POA: Diagnosis present

## 2011-06-08 DIAGNOSIS — R5381 Other malaise: Secondary | ICD-10-CM | POA: Diagnosis not present

## 2011-06-08 DIAGNOSIS — K449 Diaphragmatic hernia without obstruction or gangrene: Secondary | ICD-10-CM | POA: Diagnosis present

## 2011-06-08 DIAGNOSIS — Z9981 Dependence on supplemental oxygen: Secondary | ICD-10-CM

## 2011-06-08 DIAGNOSIS — I959 Hypotension, unspecified: Secondary | ICD-10-CM | POA: Diagnosis not present

## 2011-06-08 DIAGNOSIS — I4891 Unspecified atrial fibrillation: Secondary | ICD-10-CM | POA: Diagnosis present

## 2011-06-08 DIAGNOSIS — R0609 Other forms of dyspnea: Secondary | ICD-10-CM

## 2011-06-08 DIAGNOSIS — Z885 Allergy status to narcotic agent status: Secondary | ICD-10-CM

## 2011-06-08 DIAGNOSIS — Z951 Presence of aortocoronary bypass graft: Secondary | ICD-10-CM

## 2011-06-08 DIAGNOSIS — Z9089 Acquired absence of other organs: Secondary | ICD-10-CM

## 2011-06-08 DIAGNOSIS — K5900 Constipation, unspecified: Secondary | ICD-10-CM | POA: Diagnosis not present

## 2011-06-08 DIAGNOSIS — I701 Atherosclerosis of renal artery: Secondary | ICD-10-CM | POA: Diagnosis present

## 2011-06-08 DIAGNOSIS — M1990 Unspecified osteoarthritis, unspecified site: Secondary | ICD-10-CM | POA: Diagnosis present

## 2011-06-08 DIAGNOSIS — Z79899 Other long term (current) drug therapy: Secondary | ICD-10-CM

## 2011-06-08 DIAGNOSIS — Z8249 Family history of ischemic heart disease and other diseases of the circulatory system: Secondary | ICD-10-CM

## 2011-06-08 DIAGNOSIS — R0989 Other specified symptoms and signs involving the circulatory and respiratory systems: Secondary | ICD-10-CM

## 2011-06-08 DIAGNOSIS — I2510 Atherosclerotic heart disease of native coronary artery without angina pectoris: Secondary | ICD-10-CM | POA: Diagnosis present

## 2011-06-08 DIAGNOSIS — I252 Old myocardial infarction: Secondary | ICD-10-CM

## 2011-06-08 DIAGNOSIS — J449 Chronic obstructive pulmonary disease, unspecified: Secondary | ICD-10-CM | POA: Diagnosis present

## 2011-06-08 LAB — COMPREHENSIVE METABOLIC PANEL
ALT: 8 U/L (ref 0–35)
AST: 20 U/L (ref 0–37)
CO2: 22 mEq/L (ref 19–32)
Chloride: 102 mEq/L (ref 96–112)
Creatinine, Ser: 1.25 mg/dL — ABNORMAL HIGH (ref 0.50–1.10)
GFR calc non Af Amer: 39 mL/min — ABNORMAL LOW (ref >90)
Glucose, Bld: 145 mg/dL — ABNORMAL HIGH (ref 70–99)
Sodium: 138 mEq/L (ref 135–145)
Total Bilirubin: 0.8 mg/dL (ref 0.3–1.2)

## 2011-06-08 LAB — URINALYSIS, ROUTINE W REFLEX MICROSCOPIC
Bilirubin Urine: NEGATIVE
Glucose, UA: NEGATIVE mg/dL
Hgb urine dipstick: NEGATIVE
Ketones, ur: NEGATIVE mg/dL
Nitrite: NEGATIVE
Protein, ur: NEGATIVE mg/dL
Specific Gravity, Urine: 1.016 (ref 1.005–1.030)
Urobilinogen, UA: 0.2 mg/dL (ref 0.0–1.0)
pH: 5.5 (ref 5.0–8.0)

## 2011-06-08 LAB — CBC
HCT: 39.3 % (ref 36.0–46.0)
Hemoglobin: 12.4 g/dL (ref 12.0–15.0)
MCHC: 31.6 g/dL (ref 30.0–36.0)
MCV: 92.7 fL (ref 78.0–100.0)

## 2011-06-08 LAB — DIFFERENTIAL
Basophils Relative: 1 % (ref 0–1)
Lymphocytes Relative: 13 % (ref 12–46)
Lymphs Abs: 1.1 10*3/uL (ref 0.7–4.0)
Monocytes Relative: 8 % (ref 3–12)
Neutro Abs: 6.6 10*3/uL (ref 1.7–7.7)
Neutrophils Relative %: 78 % — ABNORMAL HIGH (ref 43–77)

## 2011-06-08 LAB — CARDIAC PANEL(CRET KIN+CKTOT+MB+TROPI)
CK, MB: 2 ng/mL (ref 0.3–4.0)
Relative Index: INVALID (ref 0.0–2.5)
Total CK: 29 U/L (ref 7–177)
Troponin I: <0.3 ng/mL (ref <0.30)

## 2011-06-08 LAB — PROTIME-INR: INR: 1.76 — ABNORMAL HIGH (ref 0.00–1.49)

## 2011-06-08 LAB — PRO B NATRIURETIC PEPTIDE: Pro B Natriuretic peptide (BNP): 40128 pg/mL — ABNORMAL HIGH (ref 0–450)

## 2011-06-08 LAB — TSH: TSH: 1.348 u[IU]/mL (ref 0.350–4.500)

## 2011-06-09 DIAGNOSIS — I369 Nonrheumatic tricuspid valve disorder, unspecified: Secondary | ICD-10-CM

## 2011-06-09 LAB — PRO B NATRIURETIC PEPTIDE: Pro B Natriuretic peptide (BNP): 21704 pg/mL — ABNORMAL HIGH (ref 0–450)

## 2011-06-09 LAB — PROTIME-INR
INR: 1.63 — ABNORMAL HIGH (ref 0.00–1.49)
Prothrombin Time: 19.6 seconds — ABNORMAL HIGH (ref 11.6–15.2)

## 2011-06-09 LAB — BASIC METABOLIC PANEL
Calcium: 9.4 mg/dL (ref 8.4–10.5)
GFR calc non Af Amer: 27 mL/min — ABNORMAL LOW (ref >90)
Glucose, Bld: 102 mg/dL — ABNORMAL HIGH (ref 70–99)
Sodium: 135 mEq/L (ref 135–145)

## 2011-06-09 LAB — HEMOGLOBIN A1C: Mean Plasma Glucose: 134 mg/dL — ABNORMAL HIGH (ref <117)

## 2011-06-09 LAB — CBC
Hemoglobin: 11.7 g/dL — ABNORMAL LOW (ref 12.0–15.0)
MCHC: 32.8 g/dL (ref 30.0–36.0)
RBC: 3.92 MIL/uL (ref 3.87–5.11)
WBC: 6.1 10*3/uL (ref 4.0–10.5)

## 2011-06-09 LAB — GLUCOSE, CAPILLARY
Glucose-Capillary: 108 mg/dL — ABNORMAL HIGH (ref 70–99)
Glucose-Capillary: 102 mg/dL — ABNORMAL HIGH (ref 70–99)

## 2011-06-09 LAB — CARDIAC PANEL(CRET KIN+CKTOT+MB+TROPI)
CK, MB: 2 ng/mL (ref 0.3–4.0)
Relative Index: INVALID (ref 0.0–2.5)
Total CK: 40 U/L (ref 7–177)

## 2011-06-09 NOTE — H&P (Signed)
NAME:  [name] [name], [name] [name]               ACCOUNT NO.:  [id number]  MEDICAL RECORD NO.:  [id number]  LOCATION:  1426                         FACILITY:  [facility]  PHYSICIAN:  Gery Pray, MD      DATE OF BIRTH:  08/07/1925  DATE OF ADMISSION:  06/08/2011 DATE OF DISCHARGE:                             HISTORY & PHYSICAL   PRIMARY CARE PHYSICIAN:  Dr. Iven Finn  CARDIOLOGIST:  Rollene Rotunda, MD, [facility], with [facility].  CODE STATUS:  DNR.  The patient goes to team 5.  CHIEF COMPLAINT:  Shortness of breath.  HISTORY OF PRESENT ILLNESS:  This is an 77 year old female who lives at home with her daughter.  This morning, she felt she was smothering.  When daughter saw her at approximately 7 am, she was very lethargic.  Per daughter, almost too lethargic to respond, too lethargic to walk, to open her eyes.  She was wheezing.  She does have home oxygen that she uses as needed.  She did use it today.  It helped initially, however, it went downhill after that.  There is no report of any lower extremity edema.  She has been compliant with her medications.  There is no fever, no chills.  She did have some nausea and vomiting.  No hematemesis.  No burning urination. No altered mental status.  No chest pain.  She did have palpitations. The patient does have history of atrial fibrillation, on Coumadin.  She has been cardioverted twice, however, she reverts back to AFib.  The patient does have a history of recurrent congestive heart failure.  She has a history of MI and history of CABG.  History obtained from the patient and her daughter is at her bedside, both appear reliable.  REVIEW OF SYSTEMS:  All 10-point systems reviewed and negative except as noted in HPI.  PAST MEDICAL HISTORY: 1. Coronary artery disease. 2. Paroxysmal AFib. 3. Heparin-induced thrombocytopenia. 4. Hypertension. 5. Diabetes mellitus. 6. Rotator cuff. 7. COPD. 8. History of renal artery stenosis.  The patient  only has 1     functional kidney.  PAST SURGICAL HISTORY:  Stent to renal artery, cholecystectomy, carotid endarterectomy, CABG, thoracentesis.  MEDICATIONS: 1. Potassium. 2. Cortisone. 3. Vitamin B12 injections. 4. Nitroglycerin p.r.n. 5. Colace p.r.n. 6. Enalapril 5 mg twice daily. 7. Hydrocodone 7.5 p.r.n. 8. Xanax 0.25 mg daily. 9. Coumadin 5 mg daily. 10.Omeprazole daily. 11.Simvastatin 20 mg daily. 12.Lasix 20 mg twice daily. 13.Coreg 3.125 mg p.o. twice daily with meals.  ALLERGIES:  The patient is allergic to, 1. HEPARIN. 2. ALTACE. 3. RAMIPRIL. 4. DILAUDID. 5. IV DYE. 6. LIPITOR. 7. MORPHINE.  SOCIAL HISTORY:  Negative for tobacco, alcohol, or illicit drugs.  She does not use a walker.  She lives at home with her daughter.  FAMILY HISTORY:  Significant for coronary artery disease.  PHYSICAL EXAMINATION:  VITAL SIGNS:  BP 111/72, pulse currently 83 (the patient on a Cardizem drip), respirations 20, temperature 98.4, saturating 93% on 3 L nasal cannula. GENERAL:  Weak, drained appearing female, alert and oriented. EYES:  Pink conjunctivae.  PERRLA. ENT:  Mildly dry oral mucosa.  Trachea midline. NECK:  Supple.  Mild JVD. LUNGS:  Mild  wheeze.  No use of accessory muscles. CARDIOVASCULAR:  Irregular rate and rhythm without murmurs, regurge, or gallops. ABDOMEN:  Soft, positive bowel sounds, nontender, nondistended.  No organomegaly. NEURO:  Cranial nerves II through XII grossly intact.  Sensation intact. MUSCULOSKELETAL:  The patient is globally weakened strength approximately 4-1/2 out of 5 all extremities. EXTREMITIES:  No edema. SKIN:  No rashes.  No subcutaneous crepitation.  LABORATORY DATA:  X-ray of abdomen and chest shows cardiomegaly and pulmonary vascular congestion with a small right pleural effusion, no acute abdomen, large stool in the rectosigmoid colon noted.  White blood count 8.5, hemoglobin 12.4, platelets 266.  INR 1.76.  Sodium  138, potassium 4.4, chloride 102, CO2 of 22, glucose 145, BUN 15, creatinine 1.25.  LFTs are normal.  Troponin is 0.15.  BNP is 40,128 which is up from 28,722.  UA is negative.  EKG, AFib, with RVR, with a rate of 134.  ASSESSMENT AND PLAN: 1. Acute exacerbation of congestive heart failure, systolic     dysfunction. 2. Atrial fibrillation with rapid ventricular response.  The patient     has already been seen by Cardiology, orders written for Cardizem     drip and to continue Coreg.  We will continue Coumadin, Pharmacy to     dose.  We will     cycle cardiac enzyme.  We will order IV Lasix and continue to     monitor the patient. 3. Chronic respiratory failure, continue oxygen. 4. Hypertension and diabetes mellitus.  Resume home medications.          ______________________________ Gery Pray, MD     DC/MEDQ  D:  06/08/2011  T:  06/08/2011  Job:  [id number]  Electronically Signed by Gery Pray MD on 06/09/2011 06:23:33 AM

## 2011-06-10 LAB — CBC
HCT: 35.6 % — ABNORMAL LOW (ref 36.0–46.0)
Hemoglobin: 11.2 g/dL — ABNORMAL LOW (ref 12.0–15.0)
MCH: 29.3 pg (ref 26.0–34.0)
MCHC: 31.5 g/dL (ref 30.0–36.0)
MCV: 93.2 fL (ref 78.0–100.0)

## 2011-06-10 LAB — GLUCOSE, CAPILLARY
Glucose-Capillary: 134 mg/dL — ABNORMAL HIGH (ref 70–99)
Glucose-Capillary: 117 mg/dL — ABNORMAL HIGH (ref 70–99)

## 2011-06-10 LAB — BASIC METABOLIC PANEL
BUN: 27 mg/dL — ABNORMAL HIGH (ref 6–23)
CO2: 29 mEq/L (ref 19–32)
Calcium: 9.8 mg/dL (ref 8.4–10.5)
GFR calc non Af Amer: 25 mL/min — ABNORMAL LOW (ref >90)
Glucose, Bld: 92 mg/dL (ref 70–99)
Sodium: 135 mEq/L (ref 135–145)

## 2011-06-10 LAB — PROTIME-INR: Prothrombin Time: 18.4 seconds — ABNORMAL HIGH (ref 11.6–15.2)

## 2011-06-11 DIAGNOSIS — I5043 Acute on chronic combined systolic (congestive) and diastolic (congestive) heart failure: Secondary | ICD-10-CM

## 2011-06-11 LAB — BASIC METABOLIC PANEL
BUN: 27 mg/dL — ABNORMAL HIGH (ref 6–23)
CO2: 28 mEq/L (ref 19–32)
Calcium: 10 mg/dL (ref 8.4–10.5)
Chloride: 98 mEq/L (ref 96–112)
Creatinine, Ser: 1.63 mg/dL — ABNORMAL HIGH (ref 0.50–1.10)
GFR calc Af Amer: 33 mL/min — ABNORMAL LOW (ref >90)
GFR calc non Af Amer: 28 mL/min — ABNORMAL LOW (ref >90)
Glucose, Bld: 100 mg/dL — ABNORMAL HIGH (ref 70–99)
Potassium: 3.8 mEq/L (ref 3.5–5.1)
Sodium: 136 mEq/L (ref 135–145)

## 2011-06-11 LAB — PROTIME-INR
INR: 1.83 — ABNORMAL HIGH (ref 0.00–1.49)
Prothrombin Time: 21.5 seconds — ABNORMAL HIGH (ref 11.6–15.2)

## 2011-06-11 LAB — GLUCOSE, CAPILLARY
Glucose-Capillary: 106 mg/dL — ABNORMAL HIGH (ref 70–99)
Glucose-Capillary: 108 mg/dL — ABNORMAL HIGH (ref 70–99)
Glucose-Capillary: 120 mg/dL — ABNORMAL HIGH (ref 70–99)

## 2011-06-11 LAB — CBC
MCHC: 32 g/dL (ref 30.0–36.0)
RDW: 14.1 % (ref 11.5–15.5)
WBC: 7 10*3/uL (ref 4.0–10.5)

## 2011-06-12 LAB — BASIC METABOLIC PANEL
CO2: 27 mEq/L (ref 19–32)
Chloride: 95 mEq/L — ABNORMAL LOW (ref 96–112)
GFR calc non Af Amer: 28 mL/min — ABNORMAL LOW (ref >90)
Glucose, Bld: 95 mg/dL (ref 70–99)
Potassium: 4 mEq/L (ref 3.5–5.1)
Sodium: 134 mEq/L — ABNORMAL LOW (ref 135–145)

## 2011-06-12 LAB — GLUCOSE, CAPILLARY
Glucose-Capillary: 97 mg/dL (ref 70–99)
Glucose-Capillary: 101 mg/dL — ABNORMAL HIGH (ref 70–99)
Glucose-Capillary: 136 mg/dL — ABNORMAL HIGH (ref 70–99)

## 2011-06-13 LAB — BASIC METABOLIC PANEL
BUN: 35 mg/dL — ABNORMAL HIGH (ref 6–23)
Creatinine, Ser: 1.71 mg/dL — ABNORMAL HIGH (ref 0.50–1.10)
GFR calc non Af Amer: 26 mL/min — ABNORMAL LOW (ref >90)
Glucose, Bld: 103 mg/dL — ABNORMAL HIGH (ref 70–99)
Potassium: 3.8 mEq/L (ref 3.5–5.1)

## 2011-06-13 LAB — GLUCOSE, CAPILLARY: Glucose-Capillary: 87 mg/dL (ref 70–99)

## 2011-06-14 LAB — CBC
HCT: 37.1 % (ref 36.0–46.0)
Hemoglobin: 11.5 g/dL — ABNORMAL LOW (ref 12.0–15.0)
MCH: 28.8 pg (ref 26.0–34.0)
RBC: 3.99 MIL/uL (ref 3.87–5.11)

## 2011-06-14 LAB — BASIC METABOLIC PANEL
BUN: 35 mg/dL — ABNORMAL HIGH (ref 6–23)
CO2: 30 mEq/L (ref 19–32)
Chloride: 97 mEq/L (ref 96–112)
GFR calc Af Amer: 29 mL/min — ABNORMAL LOW (ref >90)
Potassium: 3.9 mEq/L (ref 3.5–5.1)

## 2011-06-14 LAB — GLUCOSE, CAPILLARY
Glucose-Capillary: 124 mg/dL — ABNORMAL HIGH (ref 70–99)
Glucose-Capillary: 99 mg/dL (ref 70–99)

## 2011-06-14 LAB — PROTIME-INR
INR: 2.9 — ABNORMAL HIGH (ref 0.00–1.49)
Prothrombin Time: 30.8 seconds — ABNORMAL HIGH (ref 11.6–15.2)

## 2011-06-16 NOTE — Discharge Summary (Signed)
NAME:  [name] [name], [name] [name]               ACCOUNT NO.:  [id number]  MEDICAL RECORD NO.:  [id number]  LOCATION:  1426                         FACILITY:  [facility]  PHYSICIAN:  Debbora Presto, MD DATE OF BIRTH:  [birth year]  DATE OF ADMISSION:  06/08/2011 DATE OF DISCHARGE:  06/14/2011                              DISCHARGE SUMMARY   PRIMARY CARE PHYSICIAN:  Dr. Iven Finn.  PRIMARY CARDIOLOGIST:  Rollene Rotunda, MD, [facility] with [facility].  CODE STATUS:  DNR.  DISCHARGE MEDICATIONS: 1. Carvedilol 12.5 mg tablet twice daily with meals. 2. Lasix 40 mg tablet by mouth daily. 3. Insulin sliding scale 1-15 units subcutaneously 3 times a day with     meals per protocol. 4. MiraLAX powder one time a day. 5. Promethazine 12.5 mg tablet, take half tablet every 6 hours as     needed for nausea. 6. Coumadin 2 mg tablet per day (patient needs PT/INR checked on     Saturday, June 16, 2011 for dose adjustment). 7. Cortisone acetate injections monthly for shoulder pain     intramuscularly. 8. Vitamin B12 injections intramuscularly once monthly. 9. Colace 100 mg tablet daily as needed for constipation. 10.Hydrocodone/APAP 7.5/325 mg tablet 4 times a day as needed for     pain. 11.Nitroglycerin sublingual 0.4 mg tablet for chest pain every 5     minutes as needed up to 3 doses. 12.Omeprazole 20 mg capsule by mouth every morning. 13.Potassium chloride 20 mEq tablet once daily. 14.Simvastatin 20 mg tablet by mouth every morning. 15.Vitamin D2 50,000 units one capsule weekly. 16.Xanax 0.25 mg tablet by mouth daily. 17.Please stop taking following medication:  Enalapril 5 mg tablet     twice daily.  DISCHARGE DIAGNOSES: 1. Acute-on-chronic congestive heart failure, systolic exacerbation. 2. Atrial fibrillation with rapid ventricular response. 3. Respiratory failure. 4. Hypertension. 5. Diabetes. 6. Coronary artery disease. 7. Heparin-induced thrombocytopenia. 8. Rotator cuff  injury. 9. Chronic obstructive pulmonary disease.  DISPOSITION AND FOLLOWUP:  Patient will be discharged to skilled nursing facility for further care.  She was stable on discharge with vitals reviewed and hemodynamically stable.  Please note that during the hospitalization, enalapril tablets 5 mg tablet was held secondary to acute-on-chronic renal insufficiency, as well as low blood pressure. This medication can be restarted once she is seen by primary care physician who determines if medication can be restarted based on blood pressure control and based on kidney function.  Patient was also on Lasix during the hospitalization on 40 mg tablet and has responded well to treatment, diuresed well.  However, her home regimen was furosemide 20 mg tablet and that was increased to 40 mg tablet.  Please follow up on kidney function tests and readjust dose of Lasix based on patient's clinical presentation and blood pressure control.  Patient is also on Coumadin for atrial fibrillation and therefore needs PT/INR check for dose adjustment.  She was discharged on 2 mg dose Coumadin until PT/INR checked.  Please also ensure that the patient stays on 2L oxygen nasal cannula  since patient tends to desaturate less than 90% on room air at rest.  She has chronic COPD, oxygen-dependent.  DIAGNOSTIC TESTS:  During the hospitalization, June 08, 2011, abdominal series:  Cardiomegaly and pulmonary vascular congestion with small right effusion.  No acute findings in the abdomen, large stool burden in rectosigmoid colon noted.  CONSULTATIONS:  [facility].  HPI:  Patient is a very pleasant 77 year old female who was living with her daughter at home and came to [facility], brought by her daughter with main concern of progressively worsening lethargy.  Per daughter, patient was too lethargic to respond to walk, open her eyes.  Patient has oxygen at home that she only uses as needed, but per daughter  she needs to be on it continuously.  Per patient's daughter report, patient denied chest pain prior to the episode, any abdominal or urinary concerns, but did report some palpitations and shortness of breath. Please note that patient has been cardioverted twice, but she continued to revert back to atrial fibrillation.  Patient also has history of MI, status post CABG.  History was obtained from the patient's daughter who was at the bedside at the time of admission and seemed reliable.  PHYSICAL EXAMINATION AT DISCHARGE:  VITALS:  Temperature 98.5, pulse 76, respirations 18, blood pressure 120/61, saturating 91-96% on room air. GENERAL:  Sitting in bed, not in acute distress.  CARDIOVASCULAR: Irregular rate and rhythm.  No JVD. LUNGS:  Decreased sounds at bases with mild bibasilar crackles.  No wheezing, rhonchi, or rales appreciated. ABDOMEN:  Soft, nontender, nondistended.  Bowel sounds present. EXTREMITIES:  No edema. NEUROLOGIC EXAMINATION:  Grossly nonfocal.  LABS:  Sodium 135, potassium 3.9, chloride 97, bicarb 30, glucose 98, BUN 35, creatinine 1.79, PT 30.8, INR 2.9.  WBC 5.7, hemoglobin 11.5, platelets 274.  A1c 6.3.  HOSPITAL COURSE BY PROBLEM: 1. Acute-on-chronic systolic CHF exacerbation.  Patient has been seen     by Cardiology during the hospitalization and the dose of Lasix was     titrated to 40 mg tablet once daily.  In addition, patient's dose     of carvedilol was increased from home dose from 6.25 to 12.5 mg     twice daily and patient was discharged on the new dose of     carvedilol.  2D echo was repeated and the findings indicated     ejection fraction of 45% with pattern of mild LVH, mild aortic and     mitral regurgitation with right atrium mildly dilated.  Patient     will follow in outpatient setting with primary cardiologist. 2. Atrial fibrillation with RVR.  Coreg was increased  as per acute-on-     chronic systolic CHF exacerbation and Coumadin was  continued per     pharmacy.  Cardiac enzymes were sunk, cycled.  Initially troponins     elevated, but last 3 sets within normal limits.  This has been rate-     controlled throughout the hospitalization and patient was     clinically stable. 3. COPD.  Patient will require oxygen 2L nasal cannula once she leaves     from the hospital. 4. Hypertension, well-controlled during the hospitalization. 5. Diabetes mellitus, very well-controlled, A1c 6.5.  Patient can stay     on sliding scale insulin and this can be further readjusted with     primary care physician upon discharge. Over 30 minutes spent on discharging the patient.     Debbora Presto, MD     IM/MEDQ  D:  06/14/2011  T:  06/14/2011  Job:  [id number]  cc:   Dr. Iven Finn  Electronically Signed by Debbora Presto MD on 06/16/2011 10:43:02  PM

## 2011-06-16 NOTE — Consult Note (Signed)
NAME:  [name] [name], [name] [name]               ACCOUNT NO.:  [id number]  MEDICAL RECORD NO.:  [id number]  LOCATION:  1426                         FACILITY:  [facility]  PHYSICIAN:  Noralyn Pick. Eden Emms, MD, FACCDATE OF BIRTH:  1924/12/08  DATE OF CONSULTATION:  06/08/2011 DATE OF DISCHARGE:                                CONSULTATION   PRIMARY CARDIOLOGIST:  Rollene Rotunda, MD, [facility].  PRIMARY CARE PROVIDER:  Delaney Meigs, M.D.  PATIENT PROFILE:  An 77 year old female with history of CAD, status post CABG as well as episodes of atrial fibrillation, on chronic Coumadin, who presents with profound dyspnea and AFib with RVR.  PROBLEMS: 1. Probable acute on chronic mixed systolic diastolic congestive heart     failure.     a.     On October 03, 2009, TEE EF 35% to 40%.     b.     EF of 65% by catheterization in July 2011. 2. Coronary artery disease.     a.     Status post non-ST elevation MI in 1999.     b.     June 11, 2003, CABG x2 with LIMA to the LAD, vein graft      to the PDA.     c.     Left catheterization on March 04, 2010, revealing 2 of 2      patent grafts.  Left main normal.  LAD 80%/80% proximal with 95%      mid.  Ramus intermedius had a 40% stenosis.  The right coronary      artery was occluded.  EF of 65%.  Medical therapy warranted. 3. Persistent atrial fibrillation since at least December 2011, on     chronic Coumadin. 4. History of heparin-induced thrombocytopenia. 5. Carotid arterial disease, status post right carotid endarterectomy     at time of bypass surgery. 6. Peripheral vascular disease.     a.     Status post bilateral renal artery stenting in November      2004. 7. Hypertension. 8. Diabetes mellitus. 9. COPD, on home O2. 10.Hiatal hernia. 11.Degenerative joint disease. 12.Status post laparoscopic cholecystectomy.  ALLERGIES: 1. HEPARIN. 2. ALTACE. 3. LIPITOR. 4. MORPHINE. 5. IV CONTRAST.  HISTORY OF PRESENT ILLNESS:  An 77 year old female with above  problem list.  Over the past 1 to 2 weeks, she has been experiencing decreased exercise tolerance with general malaise and weakness.  Over the past week, she is having increasing dyspnea on exertion with 2-pillow orthopnea.  Her weight has been stable.  She denies edema or chest pain. This morning she awoke acutely short of breath and smothering.  EMS was called and patient was found to be in AFib with RVR with a rate in the 130s.  She was taken to the [facility] ED where abdominal films performed secondary to abdominal low back pain showed pulmonary vascular congestion.  She was placed on IV diltiazem with improved rate control. She has been admitted to the Hospitalist Service and we have been asked to evaluate.  She currently denies any chest pain or dyspnea, but reports general malaise.  FAMILY HISTORY:  Early CAD.  She has 2 sisters with coronary disease.  SOCIAL HISTORY:  The patient lives in [place] with her daughter.  She denies tobacco, alcohol, or drug use.  REVIEW OF SYSTEMS:  Positive for dyspnea on exertion.  Acute dyspnea this morning as well as orthopnea.  She has had generalized malaise and weakness.  She denies fevers or chills.  Appears a full code. Otherwise, all systems reviewed and negative.  PHYSICAL EXAMINATION:  VITAL SIGNS:  Temperature 98.4, heart rate 83, respirations 20, blood pressure 111/72, pulse ox 93% on 3 liters. GENERAL:  Pleasant white female, in no acute distress.  Awake, alert, and oriented x3.  She has a flat affect. HEENT:  Normal.  Nares grossly intact.  Nonfocal. SKIN:  Warm and dry without lesions or masses. NECK:  Supple without bruits or JVD. LUNGS:  Respirations are unlabored with bibasilar crackles. CARDIAC:  Irregularly irregular.  S1, S2.  No S3, S4, or murmurs. ABDOMEN:  Round, soft, nontender, nondistended.  Bowel sounds present. EXTREMITIES:  All four extremities warm, dry, and pink.  No clubbing, cyanosis, or edema.  Distal  pulses 1+ bilaterally.  Abdominal series showed cardiomegaly, pulmonary vascular congestion, and small right pleural effusion.  No acute abdominal findings.  EKG shows AFib, rate of 134, and normal axis.  She has Q's in III and poor R-wave progression.  Hemoglobin 12.4, hematocrit 39.3, WBC 8.5, platelets 266. Sodium 138, potassium 4.4, chloride 102, CO2 of 22, BUN 15, creatinine 1.25.  Glucose 145, total bilirubin 0.8, alkaline phosphatase 64, AST 20, ALT 8.  Total protein 7.7, albumin 3.8, lipase 16.  Troponin 0.15. INR 1.76.  ASSESSMENT AND PLAN: 1. Acute systolic/diastolic congestive heart failure.  This occurred     in the setting of atrial fibrillation with rapid ventricular     response.  It was not clear, which came first.  I agree with IV     diltiazem for rate control for now and we will resume home     carvedilol and titrate as needed for additional rate control.  She     has been having progressive fatigue and we will recommend checking     thyroid function.  We will repeat 2D echocardiogram, given fairly     sudden dropoff activity.  Continue ACE inhibitor therapy and we     will follow along. 2. ? Non-ST elevation myocardial infarction.  Patient had an elevated     point of care troponin at 0.15.  Recommend continued cycling of     cardiac enzymes.  She does have known multivessel disease and in     the setting of atrial fibrillation and heart failure, she would be     prone to having troponin elevation.  Further recommendations     following the enzyme trend.  In the meantime, we will continue her     home dose of statin, beta-blocker, ACE inhibitor. 3. Atrial fibrillation.  Rate elevated earlier in the setting of     profound dyspnea.  Hard to know what is driving what.  Continue     beta-blocker therapy and diltiazem as outlined above.  Continue     Coumadin therapy. 4. Hypertension, stable. 5. Diabetes mellitus, per primary team.  This appears to be diet      controlled at home. 6. chronic obstructive pulmonary disease on home O2.  Question     contribution of symptoms.     Nicolasa Ducking, ANP   ______________________________ Noralyn Pick. Eden Emms, MD, [facility]    CB/MEDQ  D:  06/08/2011  T:  06/09/2011  Job:  [id number]  Electronically Signed by Nicolasa Ducking ANP on 06/16/2011 02:03:35 PM Electronically Signed by Charlton Haws MD [facility] on 06/16/2011 08:52:06 PM

## 2011-09-30 ENCOUNTER — Other Ambulatory Visit: Payer: Self-pay

## 2011-09-30 ENCOUNTER — Encounter (HOSPITAL_COMMUNITY): Payer: Self-pay | Admitting: *Deleted

## 2011-09-30 ENCOUNTER — Emergency Department (HOSPITAL_COMMUNITY): Payer: MEDICARE

## 2011-09-30 ENCOUNTER — Inpatient Hospital Stay (HOSPITAL_COMMUNITY)
Admission: EM | Admit: 2011-09-30 | Discharge: 2011-10-08 | DRG: 242 | Disposition: A | Discharge status: Skilled Nursing Facility | Payer: MEDICARE | Attending: Cardiology | Admitting: Cardiology

## 2011-09-30 DIAGNOSIS — Z79899 Other long term (current) drug therapy: Secondary | ICD-10-CM

## 2011-09-30 DIAGNOSIS — R791 Abnormal coagulation profile: Secondary | ICD-10-CM | POA: Diagnosis present

## 2011-09-30 DIAGNOSIS — Z951 Presence of aortocoronary bypass graft: Secondary | ICD-10-CM

## 2011-09-30 DIAGNOSIS — I2510 Atherosclerotic heart disease of native coronary artery without angina pectoris: Secondary | ICD-10-CM | POA: Diagnosis present

## 2011-09-30 DIAGNOSIS — I739 Peripheral vascular disease, unspecified: Secondary | ICD-10-CM | POA: Diagnosis present

## 2011-09-30 DIAGNOSIS — R0602 Shortness of breath: Secondary | ICD-10-CM

## 2011-09-30 DIAGNOSIS — I509 Heart failure, unspecified: Secondary | ICD-10-CM

## 2011-09-30 DIAGNOSIS — I495 Sick sinus syndrome: Secondary | ICD-10-CM | POA: Diagnosis present

## 2011-09-30 DIAGNOSIS — M1990 Unspecified osteoarthritis, unspecified site: Secondary | ICD-10-CM | POA: Diagnosis present

## 2011-09-30 DIAGNOSIS — R531 Weakness: Secondary | ICD-10-CM

## 2011-09-30 DIAGNOSIS — I2789 Other specified pulmonary heart diseases: Secondary | ICD-10-CM | POA: Diagnosis present

## 2011-09-30 DIAGNOSIS — Z7901 Long term (current) use of anticoagulants: Secondary | ICD-10-CM

## 2011-09-30 DIAGNOSIS — R001 Bradycardia, unspecified: Secondary | ICD-10-CM

## 2011-09-30 DIAGNOSIS — E785 Hyperlipidemia, unspecified: Secondary | ICD-10-CM | POA: Diagnosis present

## 2011-09-30 DIAGNOSIS — E119 Type 2 diabetes mellitus without complications: Secondary | ICD-10-CM | POA: Diagnosis present

## 2011-09-30 DIAGNOSIS — I5043 Acute on chronic combined systolic (congestive) and diastolic (congestive) heart failure: Secondary | ICD-10-CM | POA: Diagnosis present

## 2011-09-30 DIAGNOSIS — Z9861 Coronary angioplasty status: Secondary | ICD-10-CM

## 2011-09-30 DIAGNOSIS — I10 Essential (primary) hypertension: Secondary | ICD-10-CM | POA: Diagnosis present

## 2011-09-30 DIAGNOSIS — G2581 Restless legs syndrome: Secondary | ICD-10-CM | POA: Diagnosis present

## 2011-09-30 DIAGNOSIS — I4891 Unspecified atrial fibrillation: Principal | ICD-10-CM

## 2011-09-30 DIAGNOSIS — D649 Anemia, unspecified: Secondary | ICD-10-CM | POA: Diagnosis not present

## 2011-09-30 DIAGNOSIS — I059 Rheumatic mitral valve disease, unspecified: Secondary | ICD-10-CM | POA: Diagnosis present

## 2011-09-30 DIAGNOSIS — N179 Acute kidney failure, unspecified: Secondary | ICD-10-CM | POA: Diagnosis not present

## 2011-09-30 DIAGNOSIS — I4892 Unspecified atrial flutter: Secondary | ICD-10-CM

## 2011-09-30 DIAGNOSIS — Z9181 History of falling: Secondary | ICD-10-CM

## 2011-09-30 HISTORY — DX: Depression, unspecified: F32.A

## 2011-09-30 HISTORY — DX: Heparin induced thrombocytopenia (HIT): D75.82

## 2011-09-30 HISTORY — DX: Major depressive disorder, single episode, unspecified: F32.9

## 2011-09-30 HISTORY — DX: Chronic obstructive pulmonary disease, unspecified: J44.9

## 2011-09-30 HISTORY — DX: Pleural effusion, not elsewhere classified: J90

## 2011-09-30 HISTORY — DX: Disorder of kidney and ureter, unspecified: N28.9

## 2011-09-30 HISTORY — DX: Sick sinus syndrome: I49.5

## 2011-09-30 HISTORY — DX: Gastro-esophageal reflux disease without esophagitis: K21.9

## 2011-09-30 HISTORY — DX: Heparin-induced thrombocytopenia, unspecified: D75.829

## 2011-09-30 HISTORY — DX: Unspecified atrial fibrillation: I48.91

## 2011-09-30 LAB — CBC
HCT: 33.9 % — ABNORMAL LOW (ref 36.0–46.0)
Hemoglobin: 11.1 g/dL — ABNORMAL LOW (ref 12.0–15.0)
MCH: 28.8 pg (ref 26.0–34.0)
MCHC: 32.7 g/dL (ref 30.0–36.0)
MCV: 87.8 fL (ref 78.0–100.0)
Platelets: 326 K/uL (ref 150–400)
RBC: 3.86 MIL/uL — ABNORMAL LOW (ref 3.87–5.11)
RDW: 16.9 % — ABNORMAL HIGH (ref 11.5–15.5)
WBC: 6.3 K/uL (ref 4.0–10.5)

## 2011-09-30 LAB — DIFFERENTIAL
Basophils Absolute: 0 K/uL (ref 0.0–0.1)
Basophils Relative: 1 % (ref 0–1)
Eosinophils Absolute: 0.1 K/uL (ref 0.0–0.7)
Eosinophils Relative: 1 % (ref 0–5)
Lymphocytes Relative: 18 % (ref 12–46)
Lymphs Abs: 1.1 K/uL (ref 0.7–4.0)
Monocytes Absolute: 0.5 K/uL (ref 0.1–1.0)
Monocytes Relative: 8 % (ref 3–12)
Neutro Abs: 4.6 K/uL (ref 1.7–7.7)
Neutrophils Relative %: 73 % (ref 43–77)

## 2011-09-30 LAB — HEMOGLOBIN A1C
Hgb A1c MFr Bld: 6.2 % — ABNORMAL HIGH (ref <5.7)
Mean Plasma Glucose: 131 mg/dL — ABNORMAL HIGH (ref <117)

## 2011-09-30 LAB — COMPREHENSIVE METABOLIC PANEL
ALT: <5 U/L (ref 0–35)
Alkaline Phosphatase: 50 U/L (ref 39–117)
CO2: 19 mEq/L (ref 19–32)
Chloride: 103 mEq/L (ref 96–112)
GFR calc Af Amer: 38 mL/min — ABNORMAL LOW (ref >90)
GFR calc non Af Amer: 33 mL/min — ABNORMAL LOW (ref >90)
Glucose, Bld: 133 mg/dL — ABNORMAL HIGH (ref 70–99)
Potassium: 4.3 mEq/L (ref 3.5–5.1)
Sodium: 136 mEq/L (ref 135–145)
Total Bilirubin: 0.9 mg/dL (ref 0.3–1.2)
Total Protein: 7.3 g/dL (ref 6.0–8.3)

## 2011-09-30 LAB — CARDIAC PANEL(CRET KIN+CKTOT+MB+TROPI)
CK, MB: 1.7 ng/mL (ref 0.3–4.0)
CK, MB: 1.6 ng/mL (ref 0.3–4.0)
Relative Index: INVALID (ref 0.0–2.5)
Relative Index: INVALID (ref 0.0–2.5)
Total CK: 23 U/L (ref 7–177)
Troponin I: <0.3 ng/mL (ref <0.30)
Troponin I: <0.3 ng/mL (ref <0.30)

## 2011-09-30 LAB — URINALYSIS, ROUTINE W REFLEX MICROSCOPIC
Bilirubin Urine: NEGATIVE
Glucose, UA: NEGATIVE mg/dL
Hgb urine dipstick: NEGATIVE
Ketones, ur: NEGATIVE mg/dL
Nitrite: NEGATIVE
Protein, ur: NEGATIVE mg/dL
Specific Gravity, Urine: 1.008 (ref 1.005–1.030)
Urobilinogen, UA: 0.2 mg/dL (ref 0.0–1.0)
pH: 6 (ref 5.0–8.0)

## 2011-09-30 LAB — PROTIME-INR
INR: 4.84 — ABNORMAL HIGH (ref 0.00–1.49)
INR: 3.96 — ABNORMAL HIGH (ref 0.00–1.49)
Prothrombin Time: 45.9 s — ABNORMAL HIGH (ref 11.6–15.2)
Prothrombin Time: 39.3 seconds — ABNORMAL HIGH (ref 11.6–15.2)

## 2011-09-30 LAB — APTT: aPTT: 53 seconds — ABNORMAL HIGH (ref 24–37)

## 2011-09-30 LAB — MRSA PCR SCREENING: MRSA by PCR: NEGATIVE

## 2011-09-30 LAB — PRO B NATRIURETIC PEPTIDE: Pro B Natriuretic peptide (BNP): 20600 pg/mL — ABNORMAL HIGH (ref 0–450)

## 2011-09-30 MED ORDER — DILTIAZEM HCL ER COATED BEADS 120 MG PO CP24
120.0000 mg | ORAL_CAPSULE | Freq: Once | ORAL | Status: AC
  Administered 2011-09-30: 120 mg via ORAL
  Filled 2011-09-30 (×2): qty 1

## 2011-09-30 MED ORDER — FUROSEMIDE 10 MG/ML IJ SOLN
40.0000 mg | Freq: Once | INTRAMUSCULAR | Status: AC
  Administered 2011-09-30: 40 mg via INTRAVENOUS
  Filled 2011-09-30: qty 4

## 2011-09-30 MED ORDER — SIMVASTATIN 20 MG PO TABS
20.0000 mg | ORAL_TABLET | Freq: Every day | ORAL | Status: DC
  Administered 2011-09-30 – 2011-10-03 (×3): 20 mg via ORAL
  Filled 2011-09-30 (×6): qty 1

## 2011-09-30 MED ORDER — SODIUM CHLORIDE 0.9 % IV BOLUS (SEPSIS): 1000.0000 mL | Freq: Once | INTRAVENOUS | Status: DC

## 2011-09-30 MED ORDER — FUROSEMIDE 10 MG/ML IJ SOLN
40.0000 mg | Freq: Every day | INTRAMUSCULAR | Status: AC
  Administered 2011-10-01: 40 mg via INTRAVENOUS
  Filled 2011-09-30 (×2): qty 4

## 2011-09-30 MED ORDER — ALPRAZOLAM 0.25 MG PO TABS: 0.2500 mg | ORAL_TABLET | Freq: Every evening | ORAL | Status: DC | PRN

## 2011-09-30 MED ORDER — VITAMIN D (ERGOCALCIFEROL) 1.25 MG (50000 UNIT) PO CAPS
50000.0000 [IU] | ORAL_CAPSULE | ORAL | Status: DC
  Administered 2011-09-30 – 2011-10-07 (×2): 50000 [IU] via ORAL
  Filled 2011-09-30 (×2): qty 1

## 2011-09-30 MED ORDER — CARVEDILOL 12.5 MG PO TABS
12.5000 mg | ORAL_TABLET | Freq: Two times a day (BID) | ORAL | Status: DC
  Administered 2011-09-30 – 2011-10-02 (×5): 12.5 mg via ORAL
  Filled 2011-09-30 (×7): qty 1

## 2011-09-30 MED ORDER — DILTIAZEM HCL 100 MG IV SOLR: 5.0000 mg/h | INTRAVENOUS | Status: DC

## 2011-09-30 MED ORDER — ONDANSETRON HCL 4 MG/2ML IJ SOLN: 4.0000 mg | Freq: Four times a day (QID) | INTRAMUSCULAR | Status: DC | PRN

## 2011-09-30 MED ORDER — ONDANSETRON HCL 4 MG/2ML IJ SOLN
4.0000 mg | Freq: Once | INTRAMUSCULAR | Status: DC
  Filled 2011-09-30: qty 2

## 2011-09-30 MED ORDER — HYDROCODONE-ACETAMINOPHEN 7.5-325 MG PO TABS: 1.0000 | ORAL_TABLET | ORAL | Status: DC | PRN

## 2011-09-30 MED ORDER — NITROGLYCERIN 0.4 MG SL SUBL: 0.4000 mg | SUBLINGUAL_TABLET | SUBLINGUAL | Status: DC | PRN

## 2011-09-30 MED ORDER — DEXTROSE 5 % IV SOLN
2.5000 mg/h | Freq: Once | INTRAVENOUS | Status: AC
  Administered 2011-09-30: 2.5 mg/h via INTRAVENOUS
  Filled 2011-09-30: qty 100

## 2011-09-30 MED ORDER — ALPRAZOLAM 0.25 MG PO TABS
0.2500 mg | ORAL_TABLET | Freq: Two times a day (BID) | ORAL | Status: DC | PRN
  Administered 2011-09-30 – 2011-10-04 (×6): 0.25 mg via ORAL
  Filled 2011-09-30 (×6): qty 1

## 2011-09-30 MED ORDER — BISACODYL 5 MG PO TBEC
5.0000 mg | DELAYED_RELEASE_TABLET | Freq: Every day | ORAL | Status: DC | PRN
  Administered 2011-10-02 – 2011-10-05 (×2): 5 mg via ORAL
  Filled 2011-09-30 (×3): qty 1

## 2011-09-30 MED ORDER — DEXTROSE 5 % IV SOLN
5.0000 mg/h | Freq: Once | INTRAVENOUS | Status: DC
  Filled 2011-09-30: qty 100

## 2011-09-30 MED ORDER — HYDROCODONE-ACETAMINOPHEN 5-325 MG PO TABS
1.0000 | ORAL_TABLET | ORAL | Status: DC | PRN
  Administered 2011-09-30 – 2011-10-02 (×6): 1 via ORAL
  Filled 2011-09-30 (×6): qty 1

## 2011-09-30 MED ORDER — ACETAMINOPHEN 325 MG PO TABS: 650.0000 mg | ORAL_TABLET | ORAL | Status: DC | PRN

## 2011-09-30 MED ORDER — ASPIRIN 300 MG RE SUPP
300.0000 mg | RECTAL | Status: AC
  Filled 2011-09-30: qty 1

## 2011-09-30 MED ORDER — ASPIRIN 81 MG PO CHEW
324.0000 mg | CHEWABLE_TABLET | ORAL | Status: AC
  Administered 2011-09-30: 324 mg via ORAL
  Filled 2011-09-30: qty 4

## 2011-09-30 MED ORDER — DILTIAZEM HCL 25 MG/5ML IV SOLN
5.0000 mg | Freq: Once | INTRAVENOUS | Status: AC
  Administered 2011-09-30: 5 mg via INTRAVENOUS
  Filled 2011-09-30: qty 5

## 2011-09-30 MED ORDER — POTASSIUM CHLORIDE CRYS ER 20 MEQ PO TBCR
20.0000 meq | EXTENDED_RELEASE_TABLET | Freq: Every day | ORAL | Status: DC
  Administered 2011-09-30 – 2011-10-04 (×5): 20 meq via ORAL
  Filled 2011-09-30 (×7): qty 1

## 2011-09-30 MED ORDER — SODIUM CHLORIDE 0.9 % IV BOLUS (SEPSIS)
250.0000 mL | Freq: Once | INTRAVENOUS | Status: AC
  Administered 2011-09-30: 250 mL via INTRAVENOUS

## 2011-09-30 MED ORDER — DILTIAZEM HCL 25 MG/5ML IV SOLN
5.0000 mg | Freq: Once | INTRAVENOUS | Status: AC
  Administered 2011-09-30: 5 mg via INTRAVENOUS

## 2011-09-30 MED ORDER — PANTOPRAZOLE SODIUM 40 MG PO TBEC
40.0000 mg | DELAYED_RELEASE_TABLET | Freq: Every day | ORAL | Status: DC
  Administered 2011-09-30 – 2011-10-08 (×9): 40 mg via ORAL
  Filled 2011-09-30 (×9): qty 1

## 2011-09-30 NOTE — ED Notes (Signed)
Woke up ~ 0130 with inc. Sob and weakness; home 02 at 2-3 l. No dependent edema. Productive - brown, having chills.

## 2011-09-30 NOTE — ED Provider Notes (Addendum)
History     CSN: [id number]  Arrival date & time 09/30/11  4098   First MD Initiated Contact with Patient 09/30/11 [phone]      Chief Complaint  Patient presents with  . Irregular Heart Beat  . Shortness of Breath  . Weakness    (Consider location/radiation/quality/duration/timing/severity/associated sxs/prior treatment) HPI  86yoF h/o CHF EF 45%, paroxysmal atrial fibrillation on coumadin, renal artery stenosis pw weakness, palpitations. Patient states that she woke up this morning at approximately 1:30 with shortness of breath and diffuse weakness. She states that she applied her 2 L of oxygen by nasal cannula which she has for prnshortness of breath at home. She states she felt slightly better at that time. She denies chest pain, chest pressure. She states she's had mild nausea without vomiting. There has been no diaphoresis. There is no back pain. She denies abdominal pain. She does complain of chills without fever. She also complains of increase in productive cough and describes tan sputum without hemoptysis. There has been no pain or swelling in her legs. Denies h/o VTE in self or family. No recent hosp/surg/immob. No h/o cancer. Denies exogenous hormone use,   ED Notes, ED Provider Notes from 09/30/11 0000 to 09/30/11 09:20:05       Shelby Dubin Talbert Cage., RN 09/30/2011 09:19      Woke up ~ 0130 with inc. Sob and weakness; home 02 at 2-3 l. No dependent edema. Productive - brown, having chills.     Past Medical History  Diagnosis Date  . Coronary artery disease     status post LAD stenting in 1999  . Hypertension   . Hyperlipidemia   . Diabetes mellitus   . Renal artery stenosis     bilateral- status post stenting  . Carotid artery disease     status post right carotid endarterectomy.  . Degenerative joint disease   . Pseudoaneurysm     status post repair following catherization in 1999  . Mild mitral regurgitation by prior echocardiogram   . Mild renal insufficiency     . Restless leg syndrome     Past Surgical History  Procedure Date  . Coronary artery bypass graft 2004  . Carotid endarterectomy     right  . Cholecystectomy, laparoscopic   . Pseudoaneurysm repair   . Rotator cuff repair     right    Family History  Problem Relation Age of Onset  . Cancer Mother 66    died  . Lung disease Father 23    died  . Heart disease Sister     2 sisters and her son    History  Substance Use Topics  . Smoking status: Never Smoker   . Smokeless tobacco: Not on file  . Alcohol Use: No    OB History    Grav Para Term Preterm Abortions TAB SAB Ect Mult Living                  Review of Systems  All other systems reviewed and are negative.   except as noted HPI   Allergies  Contrast media; Heparin; Hydromorphone hcl; and Ramipril  Home Medications   Current Outpatient Rx  Name Route Sig Dispense Refill  . ALPRAZOLAM 0.25 MG PO TABS Oral Take 0.25 mg by mouth at bedtime as needed. For sleep    . BISACODYL 5 MG PO TBEC Oral Take 5 mg by mouth daily as needed. For constipation    . CARVEDILOL 12.5 MG PO  TABS Oral Take 12.5 mg by mouth 2 (two) times daily with a meal.    . FUROSEMIDE 40 MG PO TABS Oral Take 40 mg by mouth daily.     Marland Kitchen HYDROCODONE-ACETAMINOPHEN 7.5-325 MG PO TABS Oral Take 1 tablet by mouth every 4 (four) hours as needed. For pain    . OMEPRAZOLE 20 MG PO CPDR Oral Take 20 mg by mouth daily.     Marland Kitchen POTASSIUM CHLORIDE CRYS ER 20 MEQ PO TBCR Oral Take 20 mEq by mouth daily.     Marland Kitchen SIMVASTATIN 20 MG PO TABS Oral Take 20 mg by mouth at bedtime.     Marland Kitchen VITAMIN D (ERGOCALCIFEROL) 50000 UNITS PO CAPS Oral Take 50,000 Units by mouth every 7 (seven) days. On Fridays    . WARFARIN SODIUM 5 MG PO TABS Oral Take 2.5-5 mg by mouth See admin instructions. 1 tablet (5 mg) on Wednesdays, 1/2 tablet (2.5 mg) on other days    . NITROGLYCERIN 0.4 MG SL SUBL Sublingual Place 0.4 mg under the tongue every 5 (five) minutes as needed. For chest pain       BP 140/75  Pulse 71  Temp 97.7 F (36.5 C)  Resp 24  SpO2 97%  Physical Exam  Nursing note and vitals reviewed. Constitutional: She is oriented to person, place, and time. She appears well-developed.  HENT:  Head: Atraumatic.  Mouth/Throat: Oropharynx is clear and moist.  Eyes: Conjunctivae and EOM are normal. Pupils are equal, round, and reactive to light.  Neck: Normal range of motion. Neck supple.  Cardiovascular: Regular rhythm, normal heart sounds and intact distal pulses.        tachycardic  Pulmonary/Chest: Effort normal and breath sounds normal. No respiratory distress. She has no wheezes. She has no rales.  Abdominal: Soft. She exhibits no distension. There is no tenderness. There is no rebound and no guarding.  Musculoskeletal: Normal range of motion. She exhibits no edema and no tenderness.  Neurological: She is alert and oriented to person, place, and time.  Skin: Skin is warm and dry. No rash noted.  Psychiatric: She has a normal mood and affect.    Date: 09/30/2011  Rate: 145  Rhythm: sinus tachycardia  QRS Axis: right  Intervals: normal  ST/T Wave abnormalities: nonspecific ST changes and nonspecific T wave changes lateral leads with ST depression, t wave inversion. Inferior leads with new t wave inversions  Conduction Disutrbances:none  Narrative Interpretation:   Old EKG Reviewed: changes noted    Date: 09/30/2011  Rate: 72  Rhythm: atrial flutter 4:1 block  QRS Axis: normal  Intervals: normal  ST/T Wave abnormalities: nonspecific ST changes  Conduction Disutrbances:none  Narrative Interpretation:   Old EKG Reviewed: changes noted   ED Course  Procedures (including critical care time)  Labs Reviewed  CBC - Abnormal; Notable for the following:    RBC 3.86 (*)    Hemoglobin 11.1 (*)    HCT 33.9 (*)    RDW 16.9 (*)    All other components within normal limits  COMPREHENSIVE METABOLIC PANEL - Abnormal; Notable for the following:     Glucose, Bld 133 (*)    Creatinine, Ser 1.41 (*)    Albumin 3.4 (*)    GFR calc non Af Amer 33 (*)    GFR calc Af Amer 38 (*)    All other components within normal limits  PRO B NATRIURETIC PEPTIDE - Abnormal; Notable for the following:    Pro B Natriuretic peptide (BNP)  20600.0 (*)    All other components within normal limits  PROTIME-INR - Abnormal; Notable for the following:    Prothrombin Time 45.9 (*)    INR 4.84 (*)    All other components within normal limits  DIFFERENTIAL  CARDIAC PANEL(CRET KIN+CKTOT+MB+TROPI)  URINALYSIS, ROUTINE W REFLEX MICROSCOPIC   Dg Chest Portable 1 View  09/30/2011  *RADIOLOGY REPORT*  Clinical Data: Tachycardia, shortness of breath  PORTABLE CHEST - 1 VIEW  Comparison: 07/24/2010  Findings: Previous median sternotomy and CABG.  Heart size upper limits normal.  Prominent coarse perihilar and bibasilar bronchovascular interstitial markings without confluent airspace infiltrate.  No effusion.  Degenerative changes of bilateral shoulders.  IMPRESSION:  1.  Mild perihilar and bibasilar interstitial edema or infiltrates. 2.  Borderline cardiomegaly.  Original Report Authenticated By: Thora Lance III, M.D.     1. Atrial flutter   2. Weakness generalized   3. Shortness of breath       MDM  Pw palpitations, weakness, shortness of breath. Initial EKG c/w sinus tachycardia but revealed to be a flutter with 4:1 block after HR improved to 72 s/p 250cc IVF, diltiazem 5mg  IVP. HR now low 100s. Patient remains weak at this time. Discuss with cardiology Dr. Shirlee Latch. Recommend lasix 40mg  and diltiazem 120mg  ER PO. Pt on coumadin at home. Will f/u in office tomorrow if she is feeling back to baseline, otherwise call them for admit.   1048 AM Patient back in 140s at this time. Lasix being given. PO Diltiazem from pharmacy.  1100 AM HR remains in 140s. Diltiazem 5mg  and Diltiazem gtt @ 2.5 ordered. Discussed admission with [facility] cardiology who will evaluate the  patient in the ED. Recommend increasing gtt to 5. Order placed.  [facility] cardiology to admit to stepdown. HR 40s- low 100s. Nursing staff D/C diltiazem gtt. To page cardiology.  Called to bedside for patient noted with HR 30s then back to 70s in a matter of seconds. Pt asx. Cardiology paged.  Patient is DNR per previous records     Forbes Cellar, MD 09/30/11 1248  Forbes Cellar, MD 09/30/11 1314

## 2011-09-30 NOTE — ED Notes (Signed)
Unsuccessful x 2 iv sticks. Iv RN called and will be hear to start iv. Dr. Hyman Hopes aware.

## 2011-09-30 NOTE — H&P (Signed)
Admit date: 09/30/2011 Referring Physician [facility] ED Primary Cardiologist Dr. Hochrein Chief complaint/reason for admission: Shortness of breath  HPI: 78-year-old woman with a history of coronary artery disease, peripheral vascular disease and paroxysmal atrial fibrillation.  Per report, she has been shocked several times to try and get back into normal sinus rhythm.  She maintained sinus rhythm for the most part.  She had been feeling somewhat fatigued over the past few days.  She had some intermittent chest pressure.  This morning at about 1:30, she felt smothered.  She could not catch her breath.  She called EMS and their initial rhythm strips showed atrial fibrillation with rapid ventricular response.  She was brought to the emergency room and has been treated with IV diltiazem.  She is now feeling much better.  Her heart rate has come down into the low 100s.  Her heart rate was in the 150s to 160s range before the medication.  She currently does not have any chest pain or pressure.  She has had some left side pain.  Her family reports that she has not been eating well.  She has been tolerating Coumadin well.  She denies any bleeding problems.  She does not require a lot of changes to her Coumadin dosing her report.  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    PMH:    Past Medical History  Diagnosis Date  . Coronary artery disease     status post LAD stenting in 1999  . Hypertension   . Hyperlipidemia   . Diabetes mellitus   . Renal artery stenosis     bilateral- status post stenting  . Carotid artery disease     status post right carotid endarterectomy.  . Degenerative joint disease   . Pseudoaneurysm     status post repair following catherization in 1999  . Mild mitral regurgitation by prior echocardiogram   . Mild renal insufficiency   . Restless leg syndrome     PSH:    Past Surgical History  Procedure Date  . Coronary artery bypass graft  2004  . Carotid endarterectomy     right  . Cholecystectomy, laparoscopic   . Pseudoaneurysm repair   . Rotator cuff repair     right    ALLERGIES:   Contrast media; Heparin; Hydromorphone hcl; and Ramipril  Prior to Admit Meds:   (Not in a hospital admission) Family HX:    Family History  Problem Relation Age of Onset  . Cancer Mother 61    died  . Lung disease Father 63    died  . Heart disease Sister     2 sisters and her son   Social HX:    No smoking.   ROS:  All 11 ROS were addressed and are negative except what is stated in the HPI  PHYSICAL EXAM Filed Vitals:   09/30/11 1200  BP: 149/74  Pulse: 46  Temp:   Resp: 26   General: Frail Head:   Normal cephalic and atramatic  Lungs:  Bibasilar crackles Heart:  Tachycardic, irregular rhythm, S1-S2 Abdomen: abdomen soft, mild left sided tenderness.  No rebound, no guarding  Extremities:   No  edema.   Neuro: Alert and oriented  Psych:  Good affect, responds appropriately   Labs:   Lab Results  Component Value Date   WBC 6.3 09/30/2011   HGB 11.1* 09/30/2011   HCT 33.9* 09/30/2011   MCV 87.8 09/30/2011   PLT   326 09/30/2011    Lab 09/30/11 0934  NA 136  K 4.3  CL 103  CO2 19  BUN 20  CREATININE 1.41*  CALCIUM 9.6  PROT 7.3  BILITOT 0.9  ALKPHOS 50  ALT <5  AST 15  GLUCOSE 133*   Lab Results  Component Value Date   CKTOTAL 26 09/30/2011   CKMB 1.6 09/30/2011   TROPONINI <0.30 09/30/2011   No results found for this basename: PTT   Lab Results  Component Value Date   INR 4.84* 09/30/2011   INR 2.90* 06/14/2011   INR 2.76* 06/13/2011     Lab Results  Component Value Date   CHOL  Value: 123        ATP III CLASSIFICATION:  <200     mg/dL   Desirable  200-239  mg/dL   Borderline High  >=240    mg/dL   High        03/03/2010   CHOL  Value: 118        ATP III CLASSIFICATION:  <200     mg/dL   Desirable  200-239  mg/dL   Borderline High  >=240    mg/dL   High        09/23/2009   CHOL  Value: 169         ATP III CLASSIFICATION:  <200     mg/dL   Desirable  200-239  mg/dL   Borderline High  >=240    mg/dL   High        08/31/2009   Lab Results  Component Value Date   HDL 41 03/03/2010   HDL 19* 09/23/2009   HDL 35* 08/31/2009   Lab Results  Component Value Date   LDLCALC  Value: 70        Total Cholesterol/HDL:CHD Risk Coronary Heart Disease Risk Table                     Men   Women  1/2 Average Risk   3.4   3.3  Average Risk       5.0   4.4  2 X Average Risk   9.6   7.1  3 X Average Risk  23.4   11.0        Use the calculated Patient Ratio above and the CHD Risk Table to determine the patient's CHD Risk.        ATP III CLASSIFICATION (LDL):  <100     mg/dL   Optimal  100-129  mg/dL   Near or Above                    Optimal  130-159  mg/dL   Borderline  160-189  mg/dL   High  >190     mg/dL   Very High 03/03/2010   LDLCALC  Value: 83        Total Cholesterol/HDL:CHD Risk Coronary Heart Disease Risk Table                     Men   Women  1/2 Average Risk   3.4   3.3  Average Risk       5.0   4.4  2 X Average Risk   9.6   7.1  3 X Average Risk  23.4   11.0        Use the calculated Patient Ratio above and the CHD Risk Table to determine the patient's CHD Risk.          ATP III CLASSIFICATION (LDL):  <100     mg/dL   Optimal  100-129  mg/dL   Near or Above                    Optimal  130-159  mg/dL   Borderline  160-189  mg/dL   High  >190     mg/dL   Very High 09/23/2009   LDLCALC  Value: 110        Total Cholesterol/HDL:CHD Risk Coronary Heart Disease Risk Table                     Men   Women  1/2 Average Risk   3.4   3.3  Average Risk       5.0   4.4  2 X Average Risk   9.6   7.1  3 X Average Risk  23.4   11.0        Use the calculated Patient Ratio above and the CHD Risk Table to determine the patient's CHD Risk.        ATP III CLASSIFICATION (LDL):  <100     mg/dL   Optimal  100-129  mg/dL   Near or Above                    Optimal  130-159  mg/dL   Borderline  160-189  mg/dL   High  >190     mg/dL   Very  High* 08/31/2009   Lab Results  Component Value Date   TRIG 61 03/03/2010   TRIG 79 09/23/2009   TRIG 121 08/31/2009   Lab Results  Component Value Date   CHOLHDL 3.0 03/03/2010   CHOLHDL 6.2 09/23/2009   CHOLHDL 4.8 08/31/2009   No results found for this basename: LDLDIRECT      Radiology:  Chest x-ray shows pulmonary edema  EKG: Atrial fibrillation with rapid ventricular response  ASSESSMENT: Paroxysmal atrial fibrillation, coronary artery disease, diastolic dysfunction  PLAN:  Continue rate control with IV diltiazem.  She is already supra- therapeutic on her Coumadin.  Hold Coumadin for now.  Hopefully, she will convert on her own to normal sinus rhythm.  Since she is symptomatic with her atrial fibrillation, I would consider adding amiodarone for rhythm control.  This was discussed with the family. I will leave this decision to Dr. Hochrein.    Pulmonary edema on chest x-ray.  Likely due to diastolic dysfunction from loss of her atrial kick.  Unclear what her ejection fraction is.  She may have had an echocardiogram done at her cardiologist's office.  We'll not order echo at this time.  Will give 2 doses of IV Lasix over the next 2 days.  After that, hopefully she can switch back to by mouth Lasix.  We'll rule out for MI with enzymes.  I don't think this event is related to coronary ischemia.    Letitia Sabala S., MD  09/30/2011  12:23 PM     

## 2011-09-30 NOTE — Progress Notes (Signed)
On-call MD Note:  I was called due to an 8 second pause.  The patient reported having a flush feeling come over her during the pause; there was no syncope.  She had been OFF of her diltiazem gtt.  Post-EKG demonstrates atrial tach with controlled ventricular response.    We will continue to monitor on tele and keep her off of all AV nodal blocking agents.  In light of this pause as well as having AF RVR, she will likely need evaluation for PPM (tachy-brady syndrome).  For tonight, strict bedrest in case of future pauses.   Gwendalyn Ege, Cardiology

## 2011-09-30 NOTE — ED Notes (Signed)
[place] Report: afiv 130-140's) B/P 160-180/84-107; attempt x 2 iv sticks.

## 2011-09-30 NOTE — ED Notes (Signed)
Family at bedside. Dr. Hyman Hopes updated pt. On plan of care.

## 2011-09-30 NOTE — Progress Notes (Signed)
Pt sleeping; pt had 8.36 second pause on monitor; pt asymptomatic; alert & oriented; denies chest pain or shortness of breath; EKG obtained; pt is no longer in Atrial Fibrillation; Ronie Spies PA notified; new order to make pt NPO after MN; requested that I call Dr. Gwendalyn Ege and update him; Dr. Mayford Knife paged; page returned; Dr. Mayford Knife updated; new order to keep pt on strict bedrest; will come to floor and further evaluate pt.   Vickie Epley [facility]  09/30/2011

## 2011-09-30 NOTE — ED Notes (Signed)
MD at bedside. 

## 2011-09-30 NOTE — ED Notes (Signed)
Admitting MD (Cardiology) at bedside.

## 2011-09-30 NOTE — Progress Notes (Signed)
ANTICOAGULATION CONSULT NOTE - Initial Consult  Pharmacy Consult for warfarin Indication: atrial fibrillation  Allergies  Allergen Reactions  . Contrast Media (Iodinated Diagnostic Agents) Other (See Comments)    Reaction noted by md  . Heparin Other (See Comments)    Clots blood instead of thinning  . Hydromorphone Hcl Other (See Comments)    Crazy feeling  . Ramipril Hives    Patient Measurements:   Vital Signs: Temp: 97.7 F (36.5 C) (02/17 0924) BP: 134/83 mmHg (02/17 1515) Pulse Rate: 125  (02/17 1500)  Labs:  Basename 09/30/11 0941 09/30/11 0934  HGB -- 11.1*  HCT -- 33.9*  PLT -- 326  APTT -- --  LABPROT -- 45.9*  INR -- 4.84*  HEPARINUNFRC -- --  CREATININE -- 1.41*  CKTOTAL 26 --  CKMB 1.6 --  TROPONINI <0.30 --   The CrCl is unknown because both a height and weight (above a minimum accepted value) are required for this calculation.  Medical History: Past Medical History  Diagnosis Date  . Coronary artery disease     status post LAD stenting in 1999  . Hypertension   . Hyperlipidemia   . Renal artery stenosis     bilateral- status post stenting  . Carotid artery disease     status post right carotid endarterectomy.  . Degenerative joint disease   . Pseudoaneurysm     status post repair following catherization in 1999  . Mild mitral regurgitation by prior echocardiogram   . Mild renal insufficiency   . Restless leg syndrome     Medications:  Prescriptions prior to admission  Medication Sig Dispense Refill  . ALPRAZolam (XANAX) 0.25 MG tablet Take 0.25 mg by mouth at bedtime as needed. For sleep      . bisacodyl (DULCOLAX) 5 MG EC tablet Take 5 mg by mouth daily as needed. For constipation      . carvedilol (COREG) 12.5 MG tablet Take 12.5 mg by mouth 2 (two) times daily with a meal.      . furosemide (LASIX) 40 MG tablet Take 40 mg by mouth daily.       Marland Kitchen HYDROcodone-acetaminophen (NORCO) 7.5-325 MG per tablet Take 1 tablet by mouth every 4  (four) hours as needed. For pain      . omeprazole (PRILOSEC) 20 MG capsule Take 20 mg by mouth daily.       . potassium chloride SA (K-DUR,KLOR-CON) 20 MEQ tablet Take 20 mEq by mouth daily.       . simvastatin (ZOCOR) 20 MG tablet Take 20 mg by mouth at bedtime.       . Vitamin D, Ergocalciferol, (DRISDOL) 50000 UNITS CAPS Take 50,000 Units by mouth every 7 (seven) days. On Fridays      . warfarin (COUMADIN) 5 MG tablet Take 2.5-5 mg by mouth See admin instructions. 1 tablet (5 mg) on Wednesdays, 1/2 tablet (2.5 mg) on other days      . nitroGLYCERIN (NITROSTAT) 0.4 MG SL tablet Place 0.4 mg under the tongue every 5 (five) minutes as needed. For chest pain        Assessment: 78 yo F admitted with SOB, EMS found her to be in afib with RVR, treated with Dilt in ED, HR decreased from 150s-160s to low 100s.  On chronic warfarin at home, current INR above goal.  No bleeding complications noted.  Goal of Therapy:  INR 2-3   Plan:  1.  Hold warfarin. 2.  Check daily INR.  Jill Side  L. Illene Bolus, PharmD, BCPS Clinical Pharmacist Pager: [phone] 09/30/2011 4:04 PM

## 2011-10-01 ENCOUNTER — Encounter (HOSPITAL_COMMUNITY): Payer: Self-pay | Admitting: Internal Medicine

## 2011-10-01 DIAGNOSIS — I498 Other specified cardiac arrhythmias: Secondary | ICD-10-CM

## 2011-10-01 DIAGNOSIS — I059 Rheumatic mitral valve disease, unspecified: Secondary | ICD-10-CM

## 2011-10-01 DIAGNOSIS — I5043 Acute on chronic combined systolic (congestive) and diastolic (congestive) heart failure: Secondary | ICD-10-CM

## 2011-10-01 DIAGNOSIS — R001 Bradycardia, unspecified: Secondary | ICD-10-CM

## 2011-10-01 LAB — LIPID PANEL
Cholesterol: 82 mg/dL (ref 0–200)
Total CHOL/HDL Ratio: 3.2 RATIO
VLDL: 18 mg/dL (ref 0–40)

## 2011-10-01 LAB — CARDIAC PANEL(CRET KIN+CKTOT+MB+TROPI): CK, MB: 1.7 ng/mL (ref 0.3–4.0)

## 2011-10-01 LAB — CBC
HCT: 31.6 % — ABNORMAL LOW (ref 36.0–46.0)
MCHC: 32.3 g/dL (ref 30.0–36.0)
Platelets: 281 10*3/uL (ref 150–400)
RDW: 17 % — ABNORMAL HIGH (ref 11.5–15.5)
WBC: 4.9 10*3/uL (ref 4.0–10.5)

## 2011-10-01 LAB — BASIC METABOLIC PANEL
BUN: 23 mg/dL (ref 6–23)
Calcium: 9 mg/dL (ref 8.4–10.5)
Chloride: 104 mEq/L (ref 96–112)
Creatinine, Ser: 1.51 mg/dL — ABNORMAL HIGH (ref 0.50–1.10)
GFR calc Af Amer: 35 mL/min — ABNORMAL LOW (ref >90)

## 2011-10-01 LAB — PROTIME-INR: INR: 4.73 — ABNORMAL HIGH (ref 0.00–1.49)

## 2011-10-01 MED ORDER — CHLORHEXIDINE GLUCONATE 4 % EX LIQD
60.0000 mL | Freq: Once | CUTANEOUS | Status: AC
  Administered 2011-10-01: 4 via TOPICAL
  Filled 2011-10-01: qty 60

## 2011-10-01 MED ORDER — VITAMIN K1 1 MG/0.5ML IJ SOLN: 1.0000 mg | Freq: Once | INTRAMUSCULAR | Status: DC

## 2011-10-01 MED ORDER — VITAMIN K1 10 MG/ML IJ SOLN
1.0000 mg | Freq: Once | INTRAVENOUS | Status: AC
  Administered 2011-10-01: 1 mg via INTRAVENOUS
  Filled 2011-10-01: qty 0.1

## 2011-10-01 MED ORDER — SODIUM CHLORIDE 0.9 % IR SOLN
80.0000 mg | Status: DC
  Filled 2011-10-01: qty 2

## 2011-10-01 MED ORDER — SODIUM CHLORIDE 0.9 % IV SOLN
INTRAVENOUS | Status: DC
  Administered 2011-10-02: 06:00:00 via INTRAVENOUS

## 2011-10-01 MED ORDER — SODIUM CHLORIDE 0.9 % IV SOLN: INTRAVENOUS | Status: DC

## 2011-10-01 MED ORDER — CEFAZOLIN SODIUM 1-5 GM-% IV SOLN
1.0000 g | INTRAVENOUS | Status: DC
  Filled 2011-10-01: qty 50

## 2011-10-01 MED ORDER — CHLORHEXIDINE GLUCONATE 4 % EX LIQD
60.0000 mL | Freq: Once | CUTANEOUS | Status: AC
  Administered 2011-10-02: 4 via TOPICAL
  Filled 2011-10-01: qty 60

## 2011-10-01 MED ORDER — SODIUM CHLORIDE 0.45 % IV SOLN: INTRAVENOUS | Status: DC

## 2011-10-01 MED ORDER — SODIUM CHLORIDE 0.45 % IV SOLN
INTRAVENOUS | Status: DC
  Administered 2011-10-02: 06:00:00 via INTRAVENOUS

## 2011-10-01 NOTE — Progress Notes (Signed)
UR Completed. Simmons, Krisandra Bueno F [phone]  

## 2011-10-01 NOTE — Progress Notes (Signed)
Subjective:   Pt is an 78 yo with hx of SSS - had an 8 second pause last night.  Cardiac enzymes negative so far.  She is in chronic A-fib according to patient.       Marland Kitchen aspirin  324 mg Oral NOW   Or  . aspirin  300 mg Rectal NOW  . carvedilol  12.5 mg Oral BID WC  . diltiazem  120 mg Oral Once  . diltiazem (CARDIZEM) infusion  2.5-5 mg/hr Intravenous Once  . diltiazem  5 mg Intravenous Once  . diltiazem  5 mg Intravenous Once  . furosemide  40 mg Intravenous Once  . furosemide  40 mg Intravenous Daily  . ondansetron (ZOFRAN) IV  4 mg Intravenous Once  . pantoprazole  40 mg Oral Q1200  . potassium chloride SA  20 mEq Oral Daily  . simvastatin  20 mg Oral QHS  . sodium chloride  250 mL Intravenous Once  . Vitamin D (Ergocalciferol)  50,000 Units Oral Q7 days  . DISCONTD: diltiazem (CARDIZEM) infusion  5-10 mg/hr Intravenous Once  . DISCONTD: sodium chloride  1,000 mL Intravenous Once      . DISCONTD: diltiazem (CARDIZEM) infusion      Objective:  Vital Signs in the last 24 hours: Blood pressure 119/53, pulse 71, temperature 97.9 F (36.6 C), temperature source Oral, resp. rate 18, height 5\' 4"  (1.626 m), weight 117 lb 8.1 oz (53.3 kg), SpO2 97.00%. Temp:  [97.5 F (36.4 C)-99 F (37.2 C)] 97.9 F (36.6 C) (02/18 0400) Pulse Rate:  [38-145] 71  (02/17 1656) Resp:  [18-30] 18  (02/18 0000) BP: (99-165)/(41-122) 119/53 mmHg (02/18 0700) SpO2:  [93 %-100 %] 97 % (02/18 0700) Weight:  [116 lb 9.6 oz (52.889 kg)-117 lb 8.1 oz (53.3 kg)] 117 lb 8.1 oz (53.3 kg) (02/18 0400)  Intake/Output from previous day: 02/17 0701 - 02/18 0700 In: -  Out: 500 [Urine:500] Intake/Output from this shift:    Physical Exam:  Physical Exam: Blood pressure 119/53, pulse 71, temperature 97.9 F (36.6 C), temperature source Oral, resp. rate 18, height 5\' 4"  (1.626 m), weight 117 lb 8.1 oz (53.3 kg), SpO2 97.00%. General: Well developed, well nourished, in no acute distress. Head:  Normocephalic, atraumatic, sclera non-icteric, mucus membranes are moist,  Neck: Supple. Negative for carotid bruits. JVD not elevated. Lungs: Clear bilaterally to auscultation without wheezes, rales, or rhonchi. Breathing is unlabored. Heart: irregularly irregularly with S1 S2. No murmurs, rubs, or gallops appreciated. Abdomen: Soft, non-tender, non-distended with normoactive bowel sounds. No hepatomegaly. No rebound/guarding. No obvious abdominal masses. Msk:  Strength and tone appear normal for age. Extremities: No clubbing or cyanosis. No edema.  Distal pedal pulses are 2+ and equal bilaterally. Neuro: Alert and oriented X 3. Moves all extremities spontaneously. Psych:  Responds to questions appropriately with a normal affect.    Lab Results:   [facility] 10/01/11 0251 09/30/11 0934  NA 138 136  K 3.9 4.3  CL 104 103  CO2 23 19  GLUCOSE 90 133*  BUN 23 20  CREATININE 1.51* 1.41*  CALCIUM 9.0 9.6  MG -- --  PHOS -- --    Basename 09/30/11 0934  AST 15  ALT <5  ALKPHOS 50  BILITOT 0.9  PROT 7.3  ALBUMIN 3.4*   No results found for this basename: LIPASE:2,AMYLASE:2 in the last 72 hours  Basename 10/01/11 0251 09/30/11 0934  WBC 4.9 6.3  NEUTROABS -- 4.6  HGB 10.2* 11.1*  HCT  31.6* 33.9*  MCV 88.0 87.8  PLT 281 326    Basename 10/01/11 0251 09/30/11 2052 09/30/11 1611 09/30/11 0941  CKTOTAL 23 23 26 26   CKMB 1.7 1.6 1.7 1.6  TROPONINI <0.30 <0.30 <0.30 <0.30   No components found with this basename: POCBNP:3 No results found for this basename: DDIMER in the last 72 hours  Basename 09/30/11 1611  HGBA1C 6.2*    Basename 10/01/11 0251  CHOL 82  HDL 26*  LDLCALC 38  TRIG 88  CHOLHDL 3.2   No results found for this basename: TSH,T4TOTAL,FREET3,T3FREE,THYROIDAB in the last 72 hours No results found for this basename: VITAMINB12,FOLATE,FERRITIN,TIBC,IRON,RETICCTPCT in the last 72 hours  No results found for this basename: PROTEIN24HR   Lab Results    Component Value Date   INR 4.73* 10/01/2011   INR 3.96* 09/30/2011   INR 4.84* 09/30/2011    Tele:  A-fib  Assessment/Plan:    1. Sick Sinus syndrome:  Had an 8 sec pause last night.  Will need a pacer.    INR is 4.73.  Will hold coumadin and give 1 mg vit K.  Plan pacer tomorrow Cardiac enzymes  Neg   Disposition: pacer tomorrow.  Will recheck INR.    Vesta Mixer, Montez Hageman., MD, [facility] 10/01/2011, 7:53 AM LOS: Day 1

## 2011-10-01 NOTE — Progress Notes (Signed)
  Echocardiogram 2D Echocardiogram has been performed.  Brette Cast, Real Cons 10/01/2011, 2:48 PM

## 2011-10-01 NOTE — Consults (Signed)
ELECTROPHYSIOLOGY CONSULT NOTE  Patient ID: [name] [name], MRN: [id number], DOB/AGE: Mar 31, 1925 78 y.o. Admit date: 09/30/2011 Date of Consult: 10/01/2011  Primary Physician: Josue Hector, MD, MD Primary Cardiologist: jh  Chief Complaint: PAUSES   HPI [name] [name] is a 78 y.o. female : with histroy of recurrent atrial fib for which she has undergone cardioversion on two prior occasions.  She has hx of CAD with prior CABG and variably reported EF with echo 2011>>35-40% and by cath 65% at which time patent grafts were also noted.    She has had CHF on number of hospitalizations, denies COPD although the chart describes O2dependent.  The family is unaware of this diagnosis.    She has extreme DOE <<10-15 steps, no edema but abdominal swelling.  Also PND but no recent chest pain  She was admitted with recurrence of a smothering sensaiton which has been assoc with tachypalps  >>ER where she was found to have AFib with RVR  She was given rate control, and like previously, she had a significant pause this time > 8sec.  She describes an event for which she was shocked emergently at [facility] but I can not find such in the discharge summaries  Past Medical History  Diagnosis Date  . Coronary artery disease     status post LAD stenting in 1999  . Hypertension   . Hyperlipidemia   . Renal artery stenosis     bilateral- status post stenting  . Carotid artery disease     status post right carotid endarterectomy.  . Degenerative joint disease   . Pseudoaneurysm     status post repair following catherization in 1999  . Atrial fibrillation/flutter        . Bradycardia     pauses>8 sec documented while on rate control  . Restless leg syndrome   . Mild renal insufficiency       Surgical History:  Past Surgical History  Procedure Date  . Coronary artery bypass graft 2004  . Carotid endarterectomy     right  . Cholecystectomy, laparoscopic   . Pseudoaneurysm repair   . Rotator  cuff repair     right     Home Meds: Prior to Admission medications   Medication Sig Start Date End Date Taking? Authorizing Provider  ALPRAZolam (XANAX) 0.25 MG tablet Take 0.25 mg by mouth at bedtime as needed. For sleep   Yes Historical Provider, MD  bisacodyl (DULCOLAX) 5 MG EC tablet Take 5 mg by mouth daily as needed. For constipation   Yes Historical Provider, MD  carvedilol (COREG) 12.5 MG tablet Take 12.5 mg by mouth 2 (two) times daily with a meal.   Yes Historical Provider, MD  furosemide (LASIX) 40 MG tablet Take 40 mg by mouth daily.    Yes Historical Provider, MD  HYDROcodone-acetaminophen (NORCO) 7.5-325 MG per tablet Take 1 tablet by mouth every 4 (four) hours as needed. For pain   Yes Historical Provider, MD  omeprazole (PRILOSEC) 20 MG capsule Take 20 mg by mouth daily.    Yes Historical Provider, MD  potassium chloride SA (K-DUR,KLOR-CON) 20 MEQ tablet Take 20 mEq by mouth daily.    Yes Historical Provider, MD  simvastatin (ZOCOR) 20 MG tablet Take 20 mg by mouth at bedtime.    Yes Historical Provider, MD  Vitamin D, Ergocalciferol, (DRISDOL) 50000 UNITS CAPS Take 50,000 Units by mouth every 7 (seven) days. On Fridays   Yes Historical Provider, MD  warfarin (COUMADIN) 5 MG tablet  Take 2.5-5 mg by mouth See admin instructions. 1 tablet (5 mg) on Wednesdays, 1/2 tablet (2.5 mg) on other days   Yes Historical Provider, MD  nitroGLYCERIN (NITROSTAT) 0.4 MG SL tablet Place 0.4 mg under the tongue every 5 (five) minutes as needed. For chest pain    Historical Provider, MD    Inpatient Medications:     . aspirin  324 mg Oral NOW   Or  . aspirin  300 mg Rectal NOW  . carvedilol  12.5 mg Oral BID WC  . furosemide  40 mg Intravenous Daily  . ondansetron (ZOFRAN) IV  4 mg Intravenous Once  . pantoprazole  40 mg Oral Q1200  . phytonadione (VITAMIN K) IV  1 mg Intravenous Once  . potassium chloride SA  20 mEq Oral Daily  . simvastatin  20 mg Oral QHS  . Vitamin D  (Ergocalciferol)  50,000 Units Oral Q7 days  . DISCONTD: diltiazem (CARDIZEM) infusion  5-10 mg/hr Intravenous Once  . DISCONTD: phytonadione  1 mg Intramuscular Once    Allergies:  Allergies  Allergen Reactions  . Contrast Media (Iodinated Diagnostic Agents) Other (See Comments)    Reaction noted by md  . Heparin Other (See Comments)    Clots blood instead of thinning  . Hydromorphone Hcl Other (See Comments)    Crazy feeling  . Ramipril Hives    History   Social History  . Marital Status: Widowed    Spouse Name: N/A    Number of Children: N/A  . Years of Education: N/A   Occupational History  . retired Health and safety inspector    Social History Main Topics  . Smoking status: Never Smoker   . Smokeless tobacco: Not on file  . Alcohol Use: No  . Drug Use: No  . Sexually Active: Not on file   Other Topics Concern  . Not on file   Social History Narrative  . No narrative on file     Family History  Problem Relation Age of Onset  . Cancer Mother 69    died  . Lung disease Father 39    died  . Heart disease Sister     2 sisters and her son     ROS:  Please see the history of present illness.     All other systems reviewed and negative emotionally labilility    Physical Exam: Blood pressure 118/45, pulse 71, temperature 98.2 F (36.8 C), temperature source Oral, resp. rate 19, height 5\' 4"  (1.626 m), weight 117 lb 8.1 oz (53.3 kg), SpO2 100.00%. General: Well developed, well nourished female in no acute distress. Head: Normocephalic, atraumatic, sclera non-icteric, no xanthomas, nares are without discharge. Lymph Nodes:  none Neck: Negative for carotid bruits. JVD not elevated. Lungs: Clear bilaterally to auscultation without wheezes, rales, or rhonchi. Breathing is unlabored Back without scoliosis or CVATenderness +Kyphosis. Heart: Irregularly irregular with S1 S2. No murmurs, rubs, or gallops appreciated. Abdomen: Soft, non-tender, non-distended with normoactive  bowel sounds. No hepatomegaly. No rebound/guarding. No obvious abdominal masses. Msk:  Strength and tone appear normal for age. Extremities: No clubbing or cyanosis. No edema.  Distal pedal pulses are 2+ and equal bilaterally. Skin: Warm and Dry Neuro: Alert and oriented X 3. CN III-XII intact Grossly normal sensory and motor function . Psych:  Responds to questions appropriately with a normal affect.      Labs: Cardiac Enzymes  Basename 10/01/11 0251 09/30/11 2052 09/30/11 1611 09/30/11 0941  CKTOTAL 23 23 26  26  CKMB 1.7 1.6 1.7 1.6  TROPONINI <0.30 <0.30 <0.30 <0.30   CBC Lab Results  Component Value Date   WBC 4.9 10/01/2011   HGB 10.2* 10/01/2011   HCT 31.6* 10/01/2011   MCV 88.0 10/01/2011   PLT 281 10/01/2011   PROTIME:  Basename 10/01/11 0251 09/30/11 1611 09/30/11 0934  LABPROT 45.1* 39.3* 45.9*  INR 4.73* 3.96* 4.84*   Chemistry   Lab 10/01/11 0251 09/30/11 0934  NA 138 --  K 3.9 --  CL 104 --  CO2 23 --  BUN 23 --  CREATININE 1.51* --  CALCIUM 9.0 --  PROT -- 7.3  BILITOT -- 0.9  ALKPHOS -- 50  ALT -- <5  AST -- 15  GLUCOSE 90 --   Lipids Lab Results  Component Value Date   CHOL 82 10/01/2011   HDL 26* 10/01/2011   LDLCALC 38 10/01/2011   TRIG 88 10/01/2011   BNP Pro B Natriuretic peptide (BNP)  Date/Time Value Range Status  09/30/2011  9:35 AM 20600.0* 0-450 (pg/mL) Final  06/10/2011  5:35 AM 6924.0* 0-450 (pg/mL) Final  06/09/2011 12:10 PM 21704.0* 0-450 (pg/mL) Final  06/08/2011  7:23 PM 40128.0* 0-450 (pg/mL) Final   Miscellaneous Lab Results  Component Value Date   DDIMER  Value: 0.25        AT THE INHOUSE ESTABLISHED CUTOFF VALUE OF 0.48 ug/mL FEU, THIS ASSAY HAS BEEN DOCUMENTED IN THE LITERATURE TO HAVE A SENSITIVITY AND NEGATIVE PREDICTIVE VALUE OF AT LEAST 98 TO 99%.  THE TEST RESULT SHOULD BE CORRELATED WITH AN ASSESSMENT OF THE CLINICAL PROBABILITY OF DVT / VTE. 03/02/2010    Radiology/Studies:  Dg Chest Portable 1 View  09/30/2011   *RADIOLOGY REPORT*  Clinical Data: Tachycardia, shortness of breath  PORTABLE CHEST - 1 VIEW  Comparison: 07/24/2010  Findings: Previous median sternotomy and CABG.  Heart size upper limits normal.  Prominent coarse perihilar and bibasilar bronchovascular interstitial markings without confluent airspace infiltrate.  No effusion.  Degenerative changes of bilateral shoulders.  IMPRESSION:  1.  Mild perihilar and bibasilar interstitial edema or infiltrates. 2.  Borderline cardiomegaly.  Original Report Authenticated By: Osa Craver, M.D.    EKG: AFlutter with 4:1 conduction;presenting ECG AAfltuter 2:1  Multiple previous ECG>> Afib   Assessment and Plan:  Patient Active Hospital Problem List: Atrial fibrillation (07/12/2009)  Bradycardia  pauses>8sec (10/01/2011)  Dyspnea on exertion (10/01/2011) ** CHF (congestive heart failure) (10/01/2011)  Ischemic heart disease  There are a couple of issues , some straightforward and others not.  Her pausing requires pacing for control of AF/flutter with RVR  Ihave reviewed this with her and her family. The benefits and risks were reviewed including but not limited to death,  perforation, infection, lead dislodgement and device malfunction.  The patient understands agrees and is willing to proceed.  The next issue is about her dyspnea  She does not know about a diagnosis of COPD,  We will check her Echo, and consider pulm eval to help clarify    Sherryl Manges

## 2011-10-01 NOTE — Progress Notes (Signed)
ANTICOAGULATION CONSULT NOTE - Follow Up Consult  Pharmacy Consult for warfarin  Indication: atrial fibrillation  Allergies  Allergen Reactions  . Contrast Media (Iodinated Diagnostic Agents) Other (See Comments)    Reaction noted by md  . Heparin Other (See Comments)    Clots blood instead of thinning  . Hydromorphone Hcl Other (See Comments)    Crazy feeling  . Ramipril Hives    Patient Measurements: Height: 5\' 4"  (162.6 cm) Weight: 117 lb 8.1 oz (53.3 kg) IBW/kg (Calculated) : 54.7   Vital Signs: Temp: 97.9 F (36.6 C) (02/18 0400) Temp src: Oral (02/18 0400) BP: 119/53 mmHg (02/18 0700)  Labs:  Basename 10/01/11 0251 09/30/11 2052 09/30/11 1611 09/30/11 0934  HGB 10.2* -- -- 11.1*  HCT 31.6* -- -- 33.9*  PLT 281 -- -- 326  APTT -- -- 53* --  LABPROT 45.1* -- 39.3* 45.9*  INR 4.73* -- 3.96* 4.84*  HEPARINUNFRC -- -- -- --  CREATININE 1.51* -- -- 1.41*  CKTOTAL 23 23 26  --  CKMB 1.7 1.6 1.7 --  TROPONINI <0.30 <0.30 <0.30 --   Estimated Creatinine Clearance: 23.3 ml/min (by C-G formula based on Cr of 1.51).   Medications:  Scheduled:    . aspirin  324 mg Oral NOW   Or  . aspirin  300 mg Rectal NOW  . carvedilol  12.5 mg Oral BID WC  . diltiazem  120 mg Oral Once  . diltiazem (CARDIZEM) infusion  2.5-5 mg/hr Intravenous Once  . diltiazem  5 mg Intravenous Once  . diltiazem  5 mg Intravenous Once  . furosemide  40 mg Intravenous Once  . furosemide  40 mg Intravenous Daily  . ondansetron (ZOFRAN) IV  4 mg Intravenous Once  . pantoprazole  40 mg Oral Q1200  . phytonadione  1 mg Intramuscular Once  . potassium chloride SA  20 mEq Oral Daily  . simvastatin  20 mg Oral QHS  . sodium chloride  250 mL Intravenous Once  . Vitamin D (Ergocalciferol)  50,000 Units Oral Q7 days  . DISCONTD: diltiazem (CARDIZEM) infusion  5-10 mg/hr Intravenous Once  . DISCONTD: sodium chloride  1,000 mL Intravenous Once    Assessment: 78 yo female with chronic afib on  coumadin PTA and INR is 4.73. Patient also noted with sck sinus syndrome and for pacer placement in am.   Plan:  -Hold coumadin  Benny Lennert 10/01/2011,8:19 AM

## 2011-10-02 ENCOUNTER — Other Ambulatory Visit: Payer: Self-pay

## 2011-10-02 ENCOUNTER — Encounter (HOSPITAL_COMMUNITY)
Admission: EM | Disposition: A | Discharge status: Skilled Nursing Facility | Payer: Self-pay | Source: Home / Self Care | Attending: Cardiology

## 2011-10-02 DIAGNOSIS — I5022 Chronic systolic (congestive) heart failure: Secondary | ICD-10-CM

## 2011-10-02 DIAGNOSIS — I495 Sick sinus syndrome: Secondary | ICD-10-CM

## 2011-10-02 HISTORY — DX: Chronic systolic (congestive) heart failure: I50.22

## 2011-10-02 HISTORY — PX: PACEMAKER INSERTION: SHX728

## 2011-10-02 HISTORY — PX: PERMANENT PACEMAKER INSERTION: SHX5480

## 2011-10-02 LAB — PROTIME-INR
INR: 1.6 — ABNORMAL HIGH (ref 0.00–1.49)
Prothrombin Time: 19.3 seconds — ABNORMAL HIGH (ref 11.6–15.2)

## 2011-10-02 SURGERY — PERMANENT PACEMAKER INSERTION
Anesthesia: LOCAL

## 2011-10-02 MED ORDER — DILTIAZEM HCL 100 MG IV SOLR
5.0000 mg/h | INTRAVENOUS | Status: DC
  Administered 2011-10-02: 5 mg/h via INTRAVENOUS
  Filled 2011-10-02: qty 100

## 2011-10-02 MED ORDER — AMIODARONE HCL 200 MG PO TABS
200.0000 mg | ORAL_TABLET | Freq: Two times a day (BID) | ORAL | Status: DC
  Filled 2011-10-02: qty 1

## 2011-10-02 MED ORDER — CHLORHEXIDINE GLUCONATE 4 % EX LIQD: 60.0000 mL | Freq: Once | CUTANEOUS | Status: AC

## 2011-10-02 MED ORDER — MIDAZOLAM HCL 5 MG/5ML IJ SOLN
INTRAMUSCULAR | Status: AC
  Filled 2011-10-02: qty 5

## 2011-10-02 MED ORDER — CEFAZOLIN SODIUM 1-5 GM-% IV SOLN
1.0000 g | Freq: Four times a day (QID) | INTRAVENOUS | Status: AC
  Administered 2011-10-02 – 2011-10-03 (×3): 1 g via INTRAVENOUS
  Filled 2011-10-02 (×3): qty 50

## 2011-10-02 MED ORDER — ACETAMINOPHEN 325 MG PO TABS
325.0000 mg | ORAL_TABLET | ORAL | Status: DC | PRN
  Administered 2011-10-04 – 2011-10-06 (×5): 650 mg via ORAL
  Filled 2011-10-02 (×6): qty 2

## 2011-10-02 MED ORDER — CHLORHEXIDINE GLUCONATE 4 % EX LIQD
60.0000 mL | Freq: Once | CUTANEOUS | Status: AC
  Administered 2011-10-02: 4 via TOPICAL

## 2011-10-02 MED ORDER — CARVEDILOL 25 MG PO TABS
25.0000 mg | ORAL_TABLET | Freq: Two times a day (BID) | ORAL | Status: DC
  Administered 2011-10-03 – 2011-10-08 (×11): 25 mg via ORAL
  Filled 2011-10-02 (×14): qty 1

## 2011-10-02 MED ORDER — METOPROLOL TARTRATE 1 MG/ML IV SOLN
5.0000 mg | INTRAVENOUS | Status: DC | PRN
  Administered 2011-10-02: 5 mg via INTRAVENOUS
  Filled 2011-10-02 (×3): qty 5

## 2011-10-02 MED ORDER — DILTIAZEM LOAD VIA INFUSION
10.0000 mg | Freq: Once | INTRAVENOUS | Status: AC
  Administered 2011-10-02: 10 mg via INTRAVENOUS

## 2011-10-02 MED ORDER — METHYLPREDNISOLONE SODIUM SUCC 125 MG IJ SOLR
125.0000 mg | INTRAMUSCULAR | Status: AC
  Administered 2011-10-02: 125 mg via INTRAVENOUS
  Filled 2011-10-02: qty 2

## 2011-10-02 MED ORDER — CARVEDILOL 12.5 MG PO TABS
12.5000 mg | ORAL_TABLET | Freq: Once | ORAL | Status: AC
  Administered 2011-10-02: 12.5 mg via ORAL
  Filled 2011-10-02: qty 1

## 2011-10-02 MED ORDER — ONDANSETRON HCL 4 MG/2ML IJ SOLN: 4.0000 mg | Freq: Four times a day (QID) | INTRAMUSCULAR | Status: DC | PRN

## 2011-10-02 MED ORDER — SODIUM CHLORIDE 0.45 % IV SOLN: INTRAVENOUS | Status: DC

## 2011-10-02 MED ORDER — CEFAZOLIN SODIUM 1-5 GM-% IV SOLN
1.0000 g | INTRAVENOUS | Status: AC
  Administered 2011-10-02: 1 g via INTRAVENOUS
  Filled 2011-10-02: qty 50

## 2011-10-02 MED ORDER — WARFARIN SODIUM 5 MG PO TABS
5.0000 mg | ORAL_TABLET | Freq: Once | ORAL | Status: AC
  Administered 2011-10-02: 5 mg via ORAL
  Filled 2011-10-02: qty 1

## 2011-10-02 MED ORDER — HYDROCODONE-ACETAMINOPHEN 5-325 MG PO TABS
1.0000 | ORAL_TABLET | ORAL | Status: DC | PRN
  Administered 2011-10-02 (×2): 2 via ORAL
  Administered 2011-10-03 – 2011-10-04 (×2): 1 via ORAL
  Filled 2011-10-02 (×2): qty 2
  Filled 2011-10-02 (×2): qty 1

## 2011-10-02 MED ORDER — FENTANYL CITRATE 0.05 MG/ML IJ SOLN
INTRAMUSCULAR | Status: AC
  Filled 2011-10-02: qty 2

## 2011-10-02 MED ORDER — LIDOCAINE HCL (PF) 1 % IJ SOLN
INTRAMUSCULAR | Status: AC
  Filled 2011-10-02: qty 60

## 2011-10-02 MED ORDER — FAMOTIDINE IN NACL 20-0.9 MG/50ML-% IV SOLN
20.0000 mg | INTRAVENOUS | Status: AC
  Administered 2011-10-02: 20 mg via INTRAVENOUS
  Filled 2011-10-02: qty 50

## 2011-10-02 MED ORDER — SODIUM CHLORIDE 0.9 % IR SOLN
80.0000 mg | Status: DC
  Filled 2011-10-02: qty 2

## 2011-10-02 MED ORDER — METOPROLOL TARTRATE 1 MG/ML IV SOLN
INTRAVENOUS | Status: AC
  Administered 2011-10-02: 5 mg via INTRAVENOUS
  Filled 2011-10-02: qty 5

## 2011-10-02 MED ORDER — CHLORHEXIDINE GLUCONATE 4 % EX LIQD
CUTANEOUS | Status: AC
  Filled 2011-10-02: qty 30

## 2011-10-02 MED ORDER — AMIODARONE HCL 200 MG PO TABS
200.0000 mg | ORAL_TABLET | Freq: Two times a day (BID) | ORAL | Status: DC
  Administered 2011-10-02 – 2011-10-04 (×5): 200 mg via ORAL
  Filled 2011-10-02 (×7): qty 1

## 2011-10-02 MED ORDER — SODIUM CHLORIDE 0.9 % IV SOLN: INTRAVENOUS | Status: DC

## 2011-10-02 NOTE — Progress Notes (Signed)
   ELECTROPHYSIOLOGY ROUNDING NOTE    Patient Name: [name] [name] Date of Encounter: 10-02-2011    SUBJECTIVE:Patient feels well.  Shortness of breath better.  No chest pain.  Was seen by Dr Graciela Husbands yesterday in consult and is pending PPM implantation today for tachy-brady syndrome.  TELEMETRY: Reviewed telemetry pt in atrial fibrillation with rates from 50-130, no prolonged pauses Filed Vitals:   10/01/11 2329 10/01/11 2348 10/02/11 0335 10/02/11 0353  BP: 137/53  145/90   Pulse:      Temp:  98.4 F (36.9 C)  97.9 F (36.6 C)  TempSrc:  Oral  Oral  Resp:   12   Height:      Weight:      SpO2: 97% 99% 98%     Intake/Output Summary (Last 24 hours) at 10/02/11 0727 Last data filed at 10/02/11 0600  Gross per 24 hour  Intake   1094 ml  Output   1400 ml  Net   -306 ml    LABS: Basic Metabolic Panel:  Basename 10/01/11 0251 09/30/11 0934  NA 138 136  K 3.9 4.3  CL 104 103  CO2 23 19  GLUCOSE 90 133*  BUN 23 20  CREATININE 1.51* 1.41*  CALCIUM 9.0 9.6  MG -- --  PHOS -- --   CBC:  Basename 10/01/11 0251 09/30/11 0934  WBC 4.9 6.3  NEUTROABS -- 4.6  HGB 10.2* 11.1*  HCT 31.6* 33.9*  MCV 88.0 87.8  PLT 281 326   INR: 1.60 (recieved Vitamin K yesterday)   Radiology/Studies:  Dg Chest Portable 1 View 09/30/2011  *RADIOLOGY REPORT*  Clinical Data: Tachycardia, shortness of breath  PORTABLE CHEST - 1 VIEW  Comparison: 07/24/2010  Findings: Previous median sternotomy and CABG.  Heart size upper limits normal.  Prominent coarse perihilar and bibasilar bronchovascular interstitial markings without confluent airspace infiltrate.  No effusion.  Degenerative changes of bilateral shoulders.  IMPRESSION:  1.  Mild perihilar and bibasilar interstitial edema or infiltrates. 2.  Borderline cardiomegaly.  Original Report Authenticated By: Thora Lance III, M.D.   Echo: EF 35-40%, basal inferior, basal to mid posterior, and basal to mid anterolateral severe hypokinesis.   Mild to mod aortic regurgitation, Moderate mitral regurgitation, moderate tricuspid regurgitation, LA 46  EF by last cath 2011- 65%  Active Problems:  Atrial fibrillation  Bradycardia  pauses>8sec  Dyspnea on exertion  CHF (congestive heart failure)    1. Persistent afib- will require rate control long term Resume coumadin (goal INR 2-3)  2. Tachy/ brady-  The patient has symptomatic afib with RVR.  Treatment is limited by symptomatic bradycardia.  I would therefore recommend pacemaker implantation at this time.  Risks, benefits, alternatives to pacemaker implantation were discussed in detail with the patient today. The patient understands that the risks include but are not limited to bleeding, infection, pneumothorax, perforation, tamponade, vascular damage, renal failure, MI, stroke, death,  and lead dislodgement and wishes to proceed. She carries an allergy to contrast.  I will therefore premedicate with prednisone today.

## 2011-10-02 NOTE — Plan of Care (Signed)
Problem: Phase I Progression Outcomes Goal: If Ablation, see post EP orders Outcome: Not Applicable Date Met:  10/02/11 Patient ablation unsuccessful previously, planned pacemaker.

## 2011-10-02 NOTE — Progress Notes (Signed)
Contacted Dr. Armanda Magic regarding pt HR.  HR mainly in 140s (atrial tachycardia) with occasional drops into the 70s-100s (a-fib).  Orders were received to start a Cardizem drip (10mg  bolus and start at 5mg /hr).  MD aware that pt was on cardizem previously and had 8 second pauses (pt is now post-PM insertion).  MD is also aware that she was given her amiodarone and coreg at around 6pm this evening.  Pts current heart rate after starting cardizem is now maintaining in the 70s.  Pt stable, will continue to assess.

## 2011-10-02 NOTE — Progress Notes (Signed)
OT Cancellation Note  Treatment cancelled today due to patient receiving procedure or test.  Will perform OT eval next day. Thanks,  Alba Cory 10/02/2011, 2:29 PM

## 2011-10-02 NOTE — Clinical Documentation Improvement (Signed)
CHF DOCUMENTATION CLARIFICATION QUERY  THIS DOCUMENT IS NOT A PERMANENT PART OF THE MEDICAL RECORD  TO RESPOND TO THE THIS QUERY, FOLLOW THE INSTRUCTIONS BELOW:  1. If needed, update documentation for the patient's encounter via the notes activity.  2. Access this query again and click edit on the In [employer].  3. After updating, or not, click F2 to complete all highlighted (required) fields concerning your review. Select "additional documentation in the medical record" OR "no additional documentation provided".  4. Click Sign note button.  5. The deficiency will fall out of your In Basket *Please let us know if you are not able to complete this workflow by phone or e-mail (listed below).  Please update your documentation within the medical record to reflect your response to this query.                                                                                    10/02/11  Dear Dr. Johney Frame / Associates,  In a better effort to capture your patient's severity of illness, reflect appropriate length of stay and utilization of resources, a review of the patient medical record has revealed the following indicators the diagnosis of Heart Failure.    In responding to this query please exercise your independent judgment.  The fact that a query is asked, does not imply that any particular answer is desired or expected. THANKS FOR YOUR RESPONSE TO THIS QUERY.  Possible Clinical Conditions? Marland Kitchen Acute  Diastolic Congestive Heart Failure . Acute  on Chronic Diastolic Congestive Heart Failure . Chronic Diastolic Congestive Heart Failure . Other Condition . Cannot Clinically Determine  Supporting Information: - Risk Factors: Hx Diastolic CHF - Signs & Symptoms: pulmonary edema - BNP: in ED: 20,6000 - EF: 35-40% - EKG: Tachy-brady syndrome, SSS - Diuretics: Lasix 40mg  IV x2 - Meds: Coreg  Reviewed:  Thank You,  Beverley Fiedler RN Clinical Documentation Specialist: CELL:   [phone]  Health Information Management [place]

## 2011-10-02 NOTE — Progress Notes (Signed)
Orthopedic Tech Progress Note Patient Details:  [name] [name] 29-Dec-1924 [id number]  Other Ortho Devices Type of Ortho Device: Other (comment) (arm sling) Ortho Device Location: (L) UE Ortho Device Interventions: Application   Jennye Moccasin 10/02/2011, 9:01 PM

## 2011-10-02 NOTE — Op Note (Addendum)
SURGEON:  Hillis Range, MD     PREPROCEDURE DIAGNOSIS:  Tachycardia/ Bradycardia syndrome, persistent atrial fibrillation    POSTPROCEDURE DIAGNOSIS:  Tachycardia Bradycardia syndrome,persistent atrial fibrillation     PROCEDURES:   1. Pacemaker implantation.     INTRODUCTION: [name] [name] is a 78 y.o. female  with a history of persistent atrial fibrillation and symptomatic tachycardia bradycardia syndrome who presents today for pacemaker implantation.  The patient reports intermittent episodes of dizziness over the past few months.  She has been documented to have pauses of 8 seconds as well as afib with RVR.  The patient therefore presents today for pacemaker implantation.     DESCRIPTION OF PROCEDURE:  Informed written consent was obtained, and the patient was brought to the electrophysiology lab in a fasting state.  The patient required no sedation for the procedure today.  The patients left chest was prepped and draped in the usual sterile fashion by the EP lab staff. The skin overlying the left deltopectoral region was infiltrated with lidocaine for local analgesia.  A 4-cm incision was made over the left deltopectoral region.  A left subcutaneous pacemaker pocket was fashioned using a combination of sharp and blunt dissection. Electrocautery was required to assure hemostasis.    RA/RV Lead Placement: The left axillary vein was visualized and cannulated with fluoroscopic visualization.  Through the left axillary vein, a Medtronic model [id number] (serial number [id number] ) right atrial lead and a Medtronic model 5092- 58 (serial number [id number]) right ventricular lead were advanced with fluoroscopic visualization into the right atrial appendage and right ventricular apex positions respectively.  Initial atrial lead afib- waves measured 0.7 mV with impedance of 656 ohms.  Due to afib, a threshold could not be determinted.  Right ventricular lead R-waves measured 13.7 mV with an impedance of  633 ohms and a threshold of 0.2 V at 0.5 msec.  Both leads  were secured to the pectoralis fascia using #2-0 silk over the suture sleeves.   Device Placement:  The leads were then connected to a Medtronic Adapta L model ADDRL 1 (serial number [id number]) pacemaker.  The pocket was irrigated with copious gentamicin solution.  The pacemaker was then placed into the pocket.  The pocket was then closed in 2 layers with 2.0 Vicryl suture for the subcutaneous and subcuticular layers.  Steri- Strips and a sterile dressing were then applied.  There were no early apparent complications.  No contrast was required for the procedure today.    CONCLUSIONS:   1. Successful implantation of a Medtronic Adapta L dual-chamber pacemaker for persistent afib and tachycardia bradycardia syndrome  2. No early apparent complications.           Hillis Range, MD 10/02/2011 4:02 PM

## 2011-10-03 ENCOUNTER — Encounter (HOSPITAL_COMMUNITY): Payer: Self-pay | Admitting: General Practice

## 2011-10-03 ENCOUNTER — Inpatient Hospital Stay (HOSPITAL_COMMUNITY): Payer: MEDICARE

## 2011-10-03 DIAGNOSIS — I495 Sick sinus syndrome: Secondary | ICD-10-CM

## 2011-10-03 MED ORDER — YOU HAVE A PACEMAKER BOOK
Freq: Once | Status: AC
  Administered 2011-10-03: 01:00:00
  Filled 2011-10-03: qty 1

## 2011-10-03 MED ORDER — OFF THE BEAT BOOK
Freq: Once | Status: AC
  Administered 2011-10-03: 01:00:00
  Filled 2011-10-03: qty 1

## 2011-10-03 MED ORDER — DILTIAZEM HCL ER COATED BEADS 180 MG PO CP24
180.0000 mg | ORAL_CAPSULE | Freq: Every day | ORAL | Status: DC
  Administered 2011-10-03 – 2011-10-04 (×2): 180 mg via ORAL
  Filled 2011-10-03 (×3): qty 1

## 2011-10-03 MED ORDER — ROSUVASTATIN CALCIUM 5 MG PO TABS
5.0000 mg | ORAL_TABLET | Freq: Every day | ORAL | Status: DC
  Administered 2011-10-03 – 2011-10-07 (×5): 5 mg via ORAL
  Filled 2011-10-03 (×7): qty 1

## 2011-10-03 MED ORDER — WARFARIN SODIUM 5 MG PO TABS
5.0000 mg | ORAL_TABLET | Freq: Once | ORAL | Status: AC
  Administered 2011-10-03: 5 mg via ORAL
  Filled 2011-10-03: qty 1

## 2011-10-03 NOTE — Evaluation (Signed)
Occupational Therapy Evaluation Patient Details Name: [name] [name] MRN: [id number] DOB: 06/01/1925 Today's Date: 10/03/2011  Problem List:  Patient Active Problem List  Diagnoses  . DM  . HYPERLIPIDEMIA  . RESTLESS LEGS SYNDROME  . MITRAL REGURGITATION  . HYPERTENSION  . CAD  . Atrial fibrillation  . RENAL ARTERY STENOSIS  . RENAL INSUFFICIENCY  . DEGENERATIVE JOINT DISEASE  . Bradycardia  pauses>8sec  . Dyspnea on exertion  . CHF (congestive heart failure)    Past Medical History:  Past Medical History  Diagnosis Date  . Coronary artery disease     status post LAD stenting in 1999  . Hypertension   . Hyperlipidemia   . Renal artery stenosis     bilateral- status post stenting  . Carotid artery disease     status post right carotid endarterectomy.  . Degenerative joint disease   . Pseudoaneurysm     status post repair following catherization in 1999  . Atrial fibrillation/flutter        . Bradycardia     pauses>8 sec documented while on rate control  . Restless leg syndrome   . Mild renal insufficiency    Past Surgical History:  Past Surgical History  Procedure Date  . Coronary artery bypass graft 2004  . Carotid endarterectomy     right  . Cholecystectomy, laparoscopic   . Pseudoaneurysm repair   . Rotator cuff repair     right    OT Assessment/Plan/Recommendation OT Assessment Clinical Impression Statement: Pt underwent pacemaker placement on 10/02/11 and presents with the below problem list complicated by chronic Rt. shoulder pain from previous rotator cuff injury (only alleviated by cortisone shot, per pt). Will benefit from skilled OT in the acute setting to maximize I with ADL and ADL mobility to facilitate d/c to next venue of care OT Recommendation/Assessment: Patient will need skilled OT in the acute care venue OT Problem List: Decreased strength;Decreased activity tolerance;Decreased safety awareness;Decreased knowledge of use of DME or  AE;Decreased knowledge of precautions;Cardiopulmonary status limiting activity;Pain OT Therapy Diagnosis : Generalized weakness;Acute pain OT Plan OT Frequency: Min 1X/week OT Treatment/Interventions: Self-care/ADL training;Therapeutic exercise;DME and/or AE instruction;Energy conservation;Therapeutic activities;Patient/family education;Balance training OT Recommendation Follow Up Recommendations: Skilled nursing facility Equipment Recommended: Defer to next venue Individuals Consulted Consulted and Agree with Results and Recommendations: Patient;Family member/caregiver Family Member Consulted: dtr OT Goals Acute Rehab OT Goals OT Goal Formulation: With patient/family Time For Goal Achievement: 2 weeks ADL Goals Pt Will Perform Upper Body Dressing: with modified independence;Sitting, chair ADL Goal: Upper Body Dressing - Progress: Goal set today Pt Will Perform Lower Body Dressing: with supervision;Sit to stand from chair ADL Goal: Lower Body Dressing - Progress: Goal set today Pt Will Perform Tub/Shower Transfer: with min assist;Ambulation;with DME ADL Goal: Tub/Shower Transfer - Progress: Goal set today Additional ADL Goal #1: Pt will respond appropriately to 5/5 safety scenarios in prep for safe d/c home ADL Goal: Additional Goal #1 - Progress: Goal set today  OT Evaluation Precautions/Restrictions  Precautions Precautions: Fall Precaution Comments: pacemaker  Required Braces or Orthoses:  (has sling secondary pacemaker sx) Prior Functioning Home Living Lives With: Daughter Receives Help From: Family Type of Home: House   ADL ADL Eating/Feeding: Set up;Supervision/safety Eating/Feeding Details (indicate cue type and reason): limited use of RUE secondary chronic shoulder pain Where Assessed - Eating/Feeding: Chair Grooming: Performed;Set up;Supervision/safety Where Assessed - Grooming: Standing at sink Upper Body Bathing: Simulated;Minimal assistance Upper Body Bathing  Details (indicate cue type and reason): limited  use of RUE Where Assessed - Upper Body Bathing: Sitting, chair Lower Body Bathing: Simulated;Minimal assistance Where Assessed - Lower Body Bathing: Sit to stand from chair Upper Body Dressing: Simulated;Minimal assistance Where Assessed - Upper Body Dressing: Sitting, chair Lower Body Dressing: Simulated;Moderate assistance Where Assessed - Lower Body Dressing: Sit to stand from chair Toilet Transfer: Performed;Minimal assistance Toilet Transfer Details (indicate cue type and reason): Min hand-held assist for ambulation to/from toilet. Pt with decreased awareness of deficits and impact on functional status Toilet Transfer Method: Proofreader: Regular height toilet Toileting - Clothing Manipulation: Simulated;Minimal assistance Where Assessed - Glass blower/designer Manipulation: Standing Toileting - Hygiene: Simulated;Minimal assistance Where Assessed - Toileting Hygiene: Standing Tub/Shower Transfer: Not assessed Ambulation Related to ADLs: Min hand-held assist throughout room ADL Comments: Pt will benefit from tub/shower transfer and increased safety awareness with ADLs and ADL mobility. Pt also states she refuses to go to SNF if she cannot receive Rt shoulder cortisone shot either before or when she gets there Vision/Perception  Vision - History Patient Visual Report: No change from baseline Sensation/Coordination Coordination Gross Motor Movements are Fluid and Coordinated: No (Premorbid limitation to Rt shoulder) Fine Motor Movements are Fluid and Coordinated: Yes Extremity Assessment RUE Assessment RUE Assessment:  (Grossly 4/5. Shoulder NT secondary sx and h/o injury) LUE Assessment LUE Assessment:  (Grossly 4/5) Mobility  Transfers Sit to Stand: 4: Min assist;From chair/3-in-1 Stand to Sit: 4: Min assist;To chair/3-in-1 End of Session OT - End of Session Equipment Utilized During Treatment: Gait  belt Activity Tolerance: Patient limited by pain Patient left: in chair;with call bell in reach;with family/visitor present Nurse Communication: Mobility status for transfers;Mobility status for ambulation General Behavior During Session: [facility] for tasks performed Cognition:  (perseverating on cortiosone shot)   Loraina Stauffer 10/03/2011, 2:54 PM

## 2011-10-03 NOTE — Progress Notes (Signed)
   ELECTROPHYSIOLOGY ROUNDING NOTE    Patient Name: [name] [name] Date of Encounter: 10-02-2011    SUBJECTIVE:Patient feels well.  Shortness of breath better.  No chest pain.  + sun downing overnight. Daughter states that she has been doing this at home also.   Physical Exam: Filed Vitals:   10/03/11 0015 10/03/11 0100 10/03/11 0522 10/03/11 0900  BP: 147/66 133/62 157/89 134/74  Pulse: 70 67 66 77  Temp:   97.3 F (36.3 C) 97 F (36.1 C)  TempSrc:   Oral Oral  Resp: 22 20 24 20   Height:      Weight:      SpO2: 97% 94% 97% 96%    GEN- The patient is elderly appearing, alert   Head- normocephalic, atraumatic Eyes-  Sclera clear, conjunctiva pink Ears- hearing intact Oropharynx- clear Neck- supple, no JVP Lymph- no cervical lymphadenopathy Lungs- Clear to ausculation bilaterally, normal work of breathing Chest- pacemaker pocket is without hematoma Heart- irregular rate and rhythm,  GI- soft, NT, ND, + BS Extremities- no clubbing, cyanosis, or edema  Pacemaker interrogation- reviewed in detail today, LABS: Basic Metabolic Panel:  Basename 10/01/11 0251  NA 138  K 3.9  CL 104  CO2 23  GLUCOSE 90  BUN 23  CREATININE 1.51*  CALCIUM 9.0  MG --  PHOS --   CBC:  Basename 10/01/11 0251  WBC 4.9  NEUTROABS --  HGB 10.2*  HCT 31.6*  MCV 88.0  PLT 281   INR: 1.60 (recieved Vitamin K yesterday)   Radiology/Studies:  Dg Chest Portable 1 View 09/30/2011  *RADIOLOGY REPORT*  Clinical Data: Tachycardia, shortness of breath  PORTABLE CHEST - 1 VIEW  Comparison: 07/24/2010  Findings: Previous median sternotomy and CABG.  Heart size upper limits normal.  Prominent coarse perihilar and bibasilar bronchovascular interstitial markings without confluent airspace infiltrate.  No effusion.  Degenerative changes of bilateral shoulders.  IMPRESSION:  1.  Mild perihilar and bibasilar interstitial edema or infiltrates. 2.  Borderline cardiomegaly.  Original Report Authenticated  By: Thora Lance III, M.D.   Echo: EF 35-40%, basal inferior, basal to mid posterior, and basal to mid anterolateral severe hypokinesis.  Mild to mod aortic regurgitation, Moderate mitral regurgitation, moderate tricuspid regurgitation, LA 46  EF by last cath 2011- 65%  Active Problems:  Atrial fibrillation  Bradycardia  pauses>8sec  Dyspnea on exertion  CHF (congestive heart failure)    1. Persistent afib- will require rate control long term Stop IV cardizem and start PO cardizem Continue amiodarone and coreg Resume coumadin (goal INR 2-3)  2. Tachy/ brady-  Doing well s/p PPM  3. Confusion- sundowning Will ask SW to assist with family discussion for disposition  PT/OT to see  Jarold Song 9:59 AM 10/03/2011

## 2011-10-03 NOTE — Evaluation (Signed)
Physical Therapy Evaluation Patient Details Name: [name] [name] MRN: [id number] DOB: 1924-12-02 Today's Date: 10/03/2011  Problem List:  Patient Active Problem List  Diagnoses  . DM  . HYPERLIPIDEMIA  . RESTLESS LEGS SYNDROME  . MITRAL REGURGITATION  . HYPERTENSION  . CAD  . Atrial fibrillation  . RENAL ARTERY STENOSIS  . RENAL INSUFFICIENCY  . DEGENERATIVE JOINT DISEASE  . Bradycardia  pauses>8sec  . Dyspnea on exertion  . CHF (congestive heart failure)    Past Medical History:  Past Medical History  Diagnosis Date  . Coronary artery disease     status post LAD stenting in 1999  . Hypertension   . Hyperlipidemia   . Carotid artery disease     status post right carotid endarterectomy.  . Degenerative joint disease   . Bradycardia     pauses>8 sec documented while on rate control  . Restless leg syndrome   . Mild renal insufficiency   . Heparin induced thrombocytopenia   . Diphtheria     "as a child"  . Pleural effusion, bilateral 06/2003    /e-chart  . NSTEMI (non-ST elevated myocardial infarction) 01/1998    /E-chart  . Pseudoaneurysm     status post repair following catherization in 1999  . CHF (congestive heart failure) 10/02/11    "~ once a year"  . Angina   . COPD (chronic obstructive pulmonary disease)   . Atrial fibrillation/flutter        . Tachycardia-bradycardia syndrome   . GERD (gastroesophageal reflux disease)   . Renal artery stenosis     bilateral- status post stenting  . Non-functioning kidney   . Depression     "cries alot"   Past Surgical History:  Past Surgical History  Procedure Date  . Carotid endarterectomy 05/2003    right  . Insert / replace / remove pacemaker 10/02/11    initial placement  . Cholecystectomy ?02/2011  . Tonsillectomy and adenoidectomy   . Renal artery stent 06/2003    bilaterally/e-chart  . Thoracentesis ? 2000; 05/2011  . Coronary angioplasty with stent placement 1999  . Av fistula repair 01/1998   w/pseudoaneurysm repair S/P catheterization/E-chart  . Coronary artery bypass graft 05/2003    CABG X 3    PT Assessment/Plan/Recommendation PT Assessment Clinical Impression Statement: Pt is 78 yo female s/p pacer placement who has fluctuating mental status and has become increasingly difficult for her daughter to manage safely at home.  Pt with significantly decreased safety and high fall risk, she has had falls at home PTA.  PT recommends SNF placement and PT will follow acutely. PT Recommendation/Assessment: Patient will need skilled PT in the acute care venue PT Problem List: Decreased balance;Decreased safety awareness;Decreased knowledge of precautions;Decreased cognition;Decreased knowledge of use of DME;Decreased coordination;Decreased mobility Barriers to Discharge: Other (comment) (increased level of difficulty to be cared for at home) PT Therapy Diagnosis : Altered mental status;Other (comment) (balance abnormality) PT Plan PT Frequency: Min 3X/week PT Treatment/Interventions: Gait training;DME instruction;Balance training;Therapeutic exercise;Therapeutic activities;Cognitive remediation;Patient/family education PT Recommendation Follow Up Recommendations: Skilled nursing facility;Supervision/Assistance - 24 hour Equipment Recommended: Defer to next venue PT Goals  Acute Rehab PT Goals PT Goal Formulation: With patient Time For Goal Achievement: 2 weeks Pt will go Supine/Side to Sit: with modified independence PT Goal: Supine/Side to Sit - Progress: Goal set today Pt will go Sit to Supine/Side: with modified independence PT Goal: Sit to Supine/Side - Progress: Goal set today Pt will go Sit to Stand: with supervision PT  Goal: Sit to Stand - Progress: Goal set today Pt will go Stand to Sit: with supervision PT Goal: Stand to Sit - Progress: Goal set today Pt will Ambulate: >150 feet;with supervision;with least restrictive assistive device PT Goal: Ambulate - Progress: Goal  set today  PT Evaluation Precautions/Restrictions  Precautions Precautions: Fall Precaution Comments: pacemaker  Required Braces or Orthoses:  (has sling secondary pacemaker sx) Restrictions Weight Bearing Restrictions: No Prior Functioning  Home Living Lives With: Daughter Receives Help From: Family Type of Home: House Home Layout: One level Additional Comments: pt's daughter reports that pt was becoming increasingly unsafe with mobility at home and was becoming angry and combative, not wanting to take her meds when she was supposed to and at other times, taking her daughter's meds.  Daughter does not feel that she can handle her at home with cognition as it has become. Prior Function Level of Independence: Other (comment) (unsure of PLOF but pt reports living with her dtr x 2 yrs.) Driving: No Vocation: Retired Producer, television/film/video: Awake/alert Overall Cognitive Status: History of cognitive impairments History of Cognitive Impairment: Decline in baseline functioning Attention: Impaired Current Attention Level: Sustained Attention - Other Comments: pt needs frequent directing to task Memory: Appears impaired Memory Deficits: pt cannot acurately relay prior functional status or home environment Following Commands: Follows one step commands inconsistently (needs tactile cues for following mobility commands) Safety/Judgement: Decreased awareness of safety precautions;Decreased safety judgement for tasks assessed Decreased Safety/Judgement: Impulsive;Decreased awareness of need for assistance Safety/Judgement - Other Comments: pt impulsice with RW, though her daughter does state that she has not used a RW before.  Turning RW without turning her body, keeping it out in front of herself.  Pt also makes excuses for LOB such as "it's these socks" instead of accepting that her balance is off Awareness of Errors: Decreased awareness of errors made Decreased Awareness of  Errors: Assistance required to correct errors made;Assistance required to identify errors made Awareness of Deficits: Decreased awareness of deficits Problem Solving: Requires assistance for problem solving Sensation/Coordination Sensation Light Touch: Appears Intact Stereognosis: Not tested Hot/Cold: Not tested Proprioception: Not tested Coordination Gross Motor Movements are Fluid and Coordinated: No Fine Motor Movements are Fluid and Coordinated: Yes Coordination and Movement Description: see OT note and mvmt not coordinated to get OOB, affected by balance issues Extremity Assessment RUE Assessment RUE Assessment: Not tested LUE Assessment LUE Assessment: Not tested RLE Assessment RLE Assessment: Within Functional Limits LLE Assessment LLE Assessment: Within Functional Limits Mobility (including Balance) Bed Mobility Bed Mobility: Yes Supine to Sit: 5: Supervision Supine to Sit Details (indicate cue type and reason): pt able to perform supine to sit but then loses balance once sitting Sit to Supine: 5: Supervision;HOB flat Sit to Supine - Details (indicate cue type and reason): able to get legs into bed independently Transfers Transfers: Yes Sit to Stand: 4: Min assist;From bed;With upper extremity assist Sit to Stand Details (indicate cue type and reason): pt performed sit to stand with min-guard assist but then lost balance to right and sat back down onto bed. Stand to Sit: 5: Supervision;To bed;With upper extremity assist Ambulation/Gait Ambulation/Gait: Yes Ambulation/Gait Assistance: 4: Min assist Ambulation/Gait Assistance Details (indicate cue type and reason): Pt initially ambulated 77' with no AD (as she did not use one before coming in).  She was reaching for stable surfaces, unsteady, unsafe.  RW then given to pt and assist level dropped to min-guard with occasional min to direct pt in use of  RW, specifically turning Ambulation Distance (Feet): 300 Feet Assistive  device: Rolling walker Gait Pattern: Step-through pattern Gait velocity: WFL Stairs: No Wheelchair Mobility Wheelchair Mobility: No  Posture/Postural Control Posture/Postural Control: No significant limitations Balance Balance Assessed: Yes Static Sitting Balance Static Sitting - Balance Support: No upper extremity supported Static Sitting - Level of Assistance: 4: Min assist (LOB to right) Static Standing Balance Static Standing - Balance Support: Left upper extremity supported Static Standing - Level of Assistance: 4: Min assist (LOB to right) End of Session PT - End of Session Equipment Utilized During Treatment: Gait belt Activity Tolerance: Patient tolerated treatment well;Other (comment) (patient limited by AMS) Patient left: in bed;with call bell in reach Nurse Communication: Mobility status for ambulation General Behavior During Session: [facility] for tasks performed Cognition: Impaired, at baseline  Kambrey Hagger, [place]  [phone] 10/03/2011, 4:03 PM

## 2011-10-03 NOTE — Progress Notes (Addendum)
Clinical Social Worker received referral for [facility] placement for patient. CSW completed psychosocial assessment, Placement note, which can be found in the shadow chart. CSW met with patient's daughter, [name] to discuss discharge planning. Daughter has been taking care of patient for two years and does not feel that she can continue caring for her mother at her current state. Per daughter, patient is agreeable to [facility]. Daughter has stated that patient has been to [facility] and would like to have patient return there if bed is available. CSW will initiate bed search to facility once PT/OT evaluation has been completed and FL2 completed. CSW will continue to follow.   FL2 has been completed and placed on shadow chart for MD's signature.  Rozetta Nunnery MSW, [employer] [phone]

## 2011-10-03 NOTE — Progress Notes (Signed)
ANTICOAGULATION CONSULT NOTE - Follow Up Consult  Pharmacy Consult for Coumadin Indication: atrial fibrillation  Allergies  Allergen Reactions  . Contrast Media (Iodinated Diagnostic Agents) Other (See Comments)    Reaction noted by md  . Heparin Other (See Comments)    Clots blood instead of thinning  . Hydromorphone Hcl Other (See Comments)    Crazy feeling  . Ramipril Hives    Patient Measurements: Height: 5\' 4"  (162.6 cm) Weight: 117 lb 8.1 oz (53.3 kg) IBW/kg (Calculated) : 54.7   Vital Signs: Temp: 97 F (36.1 C) (02/20 0900) Temp src: Oral (02/20 0900) BP: 134/74 mmHg (02/20 0900) Pulse Rate: 77  (02/20 0900)  Labs:  Basename 10/03/11 0400 10/02/11 0530 10/01/11 0251 09/30/11 2052 09/30/11 1611  HGB -- -- 10.2* -- --  HCT -- -- 31.6* -- --  PLT -- -- 281 -- --  APTT -- -- -- -- 53*  LABPROT 17.6* 19.3* 45.1* -- --  INR 1.42 1.60* 4.73* -- --  HEPARINUNFRC -- -- -- -- --  CREATININE -- -- 1.51* -- --  CKTOTAL -- -- 23 23 26   CKMB -- -- 1.7 1.6 1.7  TROPONINI -- -- <0.30 <0.30 <0.30   Estimated Creatinine Clearance: 23.3 ml/min (by C-G formula based on Cr of 1.51).   Medications:  See med rec  Assessment: 78 yo F on chronic Coumadin for afib, now s/p PPM 2/19.  Restarted Coumadin last PM and still seeing INR decrease, which is not surprising.  Also received Vit K 1 mg IV on 2/19.  Patient was on 2.5 mg po daily except 5 mg on Wednesday's pta-but admitted with a supratherapeutic INR.  Also starting amiodarone which is new for the patient and can increase the INR.  Will likely need a decreased home Coumadin dose.  Goal of Therapy:  INR 2-3   Plan:  1.  Will repeat Coumadin 5 mg po tonight (may be seeing a little Vit K resistance), and will likely need to decrease dose tomorrow 2.  F/U daily INR  Rolland Porter, Pharm.D., BCPS Clinical Pharmacist Pager: [phone]

## 2011-10-03 NOTE — Plan of Care (Signed)
Problem: Phase III Progression Outcomes Goal: Sinus rhythm established or heart rate < 100 at rest Outcome: Progressing cardizem drip started.

## 2011-10-03 NOTE — Progress Notes (Signed)
Pt has become increasingly confused, agitated, and sad (periodic crying) throughout the shift.  Currently, we have her sitting out at the nurses station so that we can have her up in the chair (no chair alarms available) and monitor her.  Pt, at first, refused to go to x-ray, but after talking with her and calming her down she became compliant.  Her daughter was called in regards to this and a message was left.  Will continue to assess.

## 2011-10-04 ENCOUNTER — Other Ambulatory Visit: Payer: Self-pay

## 2011-10-04 DIAGNOSIS — I4892 Unspecified atrial flutter: Secondary | ICD-10-CM

## 2011-10-04 LAB — DIFFERENTIAL
Eosinophils Absolute: 0 10*3/uL (ref 0.0–0.7)
Lymphocytes Relative: 7 % — ABNORMAL LOW (ref 12–46)
Lymphs Abs: 1 10*3/uL (ref 0.7–4.0)
Monocytes Relative: 7 % (ref 3–12)
Neutrophils Relative %: 86 % — ABNORMAL HIGH (ref 43–77)

## 2011-10-04 LAB — CBC
Hemoglobin: 9.9 g/dL — ABNORMAL LOW (ref 12.0–15.0)
MCH: 28.4 pg (ref 26.0–34.0)
MCV: 87.9 fL (ref 78.0–100.0)
RBC: 3.48 MIL/uL — ABNORMAL LOW (ref 3.87–5.11)

## 2011-10-04 LAB — BASIC METABOLIC PANEL
BUN: 24 mg/dL — ABNORMAL HIGH (ref 6–23)
CO2: 22 mEq/L (ref 19–32)
Chloride: 104 mEq/L (ref 96–112)
GFR calc non Af Amer: 37 mL/min — ABNORMAL LOW (ref >90)
Glucose, Bld: 114 mg/dL — ABNORMAL HIGH (ref 70–99)
Potassium: 4.8 mEq/L (ref 3.5–5.1)

## 2011-10-04 LAB — PROTIME-INR: Prothrombin Time: 26.6 seconds — ABNORMAL HIGH (ref 11.6–15.2)

## 2011-10-04 MED ORDER — ALPRAZOLAM 0.25 MG PO TABS
0.1250 mg | ORAL_TABLET | Freq: Two times a day (BID) | ORAL | Status: DC | PRN
  Administered 2011-10-04 – 2011-10-07 (×4): 0.125 mg via ORAL
  Filled 2011-10-04 (×4): qty 1

## 2011-10-04 MED ORDER — FUROSEMIDE 10 MG/ML IJ SOLN
40.0000 mg | Freq: Every day | INTRAMUSCULAR | Status: DC
  Administered 2011-10-04: 40 mg via INTRAVENOUS
  Filled 2011-10-04 (×2): qty 4

## 2011-10-04 MED ORDER — WARFARIN SODIUM 1 MG PO TABS
1.0000 mg | ORAL_TABLET | Freq: Once | ORAL | Status: AC
  Administered 2011-10-04: 1 mg via ORAL
  Filled 2011-10-04 (×2): qty 1

## 2011-10-04 NOTE — Progress Notes (Signed)
Patient Name: [name] [name] Date of Encounter: 10/04/2011  Active Problems:  Atrial fibrillation  Bradycardia  pauses>8sec  Dyspnea on exertion  CHF (congestive heart failure)    SUBJECTIVE: Patient is sleepy but responds to touch and verbal. She is confused and becomes easily agitated.  + SOB  OBJECTIVE Filed Vitals:   10/03/11 2010 10/04/11 0024 10/04/11 0550 10/04/11 1144  BP: 145/93 157/74 142/74 138/70  Pulse: 78 75 77 63  Temp: 97.8 F (36.6 C) 97.7 F (36.5 C) 98 F (36.7 C) 97.9 F (36.6 C)  TempSrc: Oral Oral Oral Oral  Resp: 26 19 15 23   Height:      Weight:  115 lb 4.8 oz (52.3 kg)    SpO2: 94% 92% 94% 97%    Intake/Output Summary (Last 24 hours) at 10/04/11 1156 Last data filed at 10/04/11 1000  Gross per 24 hour  Intake    205 ml  Output      0 ml  Net    205 ml   Weight change:  Wt Readings from Last 3 Encounters:  10/04/11 115 lb 4.8 oz (52.3 kg)  10/04/11 115 lb 4.8 oz (52.3 kg)  07/19/10 115 lb (52.164 kg)     PHYSICAL EXAM  General: Well developed, elderly female, in no acute distress. Head: Normocephalic, atraumatic.  Neck: Supple without bruits, JVD elevated at 10 cm. Lungs:  Resp regular and unlabored, Rales in the bases, right greater than left. Heart: Irregular, S1, S2, no S3, S4, soft systolic murmur. Abdomen: Soft, non-tender, non-distended, BS + x 4.  Extremities: No clubbing, cyanosis, no edema.  Neuro:  Moves all extremities spontaneously.  LABS:  CBC:No results found for this basename: WBC:2,NEUTROABS:2,HGB:2,HCT:2,MCV:2,PLT:2 in the last 72 hours INR: Basename 10/04/11 0445  INR 2.41*   Basic Metabolic Panel:No results found for this basename: NA:2,K:2,CL:2,CO2:2,GLUCOSE:2,BUN:2,CREATININE:2,CALCIUM:2,MG:2,PHOS:2 in the last 72 hours BNP: Pro B Natriuretic peptide (BNP)  Date/Time Value Range Status  09/30/2011  9:35 AM 20600.0* 0-450 (pg/mL) Final  06/10/2011  5:35 AM 6924.0* 0-450 (pg/mL) Final    TELE:  Atrial  fib +/- v pacing.    Radiology/Studies: Dg Chest 2 View 10/03/2011  *RADIOLOGY REPORT*  Clinical Data: History of pacemaker insertion.  CHEST - 2 VIEW  Comparison: 09/12/2009.  09/30/2011.  Findings: A dual lead transvenous pacemaker is in place with controller device on the left.  No pneumothorax is evident.  There is stable moderate cardiac silhouette enlargement. Ectasia and nonaneurysmal calcification of the thoracic aorta are seen. The patient has undergone previous median sternotomy and coronary artery bypass grafting. Mediastinal and hilar contours appear stable.  There is slight upper lobe vascular prominence.  No pulmonary edema  is evident. There is elevation of the right hemidiaphragm.  Atelectasis and infiltrative density is seen in the right medial base and in the retrocardiac region.  No consolidation is evident.  A small area of nodular opacity is seen in the left base.  This area measures 10 x 8 mm in diameter. Osteopenic appearance of bones.  IMPRESSION: Transvenous pacemaker in place.  No pneumothorax.  Stable cardiac silhouette enlargement.  Mild vascular congestion pattern with no pulmonary edema or pleural effusion.  A small nodular opacity projects over the left lung base and was not definitely evident on prior examination.  This could reflect superimposition of structures or small infiltrate.  I cannot exclude a small pulmonary nodule. Follow-up PA and lateral chest examination could be obtained to see if this is a persistent density. CT  of the chest could be performed to evaluate for pulmonary nodule.  Original Report Authenticated By: Crawford Givens, M.D.    Current Medications:     . amiodarone  200 mg Oral BID  . carvedilol  25 mg Oral BID WC  . diltiazem  180 mg Oral Daily  . pantoprazole  40 mg Oral Q1200  . potassium chloride SA  20 mEq Oral Daily  . rosuvastatin  5 mg Oral q1800  . Vitamin D (Ergocalciferol)  50,000 Units Oral Q7 days  . warfarin  1 mg Oral ONCE-1800  .  warfarin  5 mg Oral ONCE-1800  . DISCONTD: simvastatin  20 mg Oral QHS    ASSESSMENT AND PLAN: 1. atrial fibrillation: Rate is controlled on current medications with pacing as needed. Goal INR 2-3 Consider cardioversion eventually  2. Bradycardia- doing well s/p PPM  3. anticoagulation: Her INR was elevated on admission but is now therapeutic. Continue Coumadin.  4. Sundowning and a history of falls: The patient was seen by physical therapy and skilled nursing facility placement is recommended. M.D. please sign FL2  5. nodular opacity on chest x-ray: The patient is afebrile. We will recheck a CBC to make sure her white count is not elevated. She had volume overload on admission. She received one dose of IV Lasix. She is still on potassium. We will recheck a bmet.  Will given additional IV lasix 6. Anemia: Her hemoglobin was 11.1 on admission. Her MCV is within normal limits. We will recheck a CBC.   Plan: Discharged to skilled nursing facility when medically stable in bed is available.  Signed, Theodore Demark , PA-C 11:56 AM 10/04/2011  I have seen, examined the patient, and reviewed the above assessment and plan.   Pt more sleepy today, likely due to being awake last night.  Lets avoid narcotics/ benzos. Avoid foley given new PPM Will give a single dose of IV lasix now  Co Sign: Hillis Range, MD 10/04/2011 4:36 PM

## 2011-10-04 NOTE — Progress Notes (Signed)
Patient c/o needing to void and has not voided after trying. Unsure of last occurrence of voiding. PA notified I will do bladder scan and if needed insert foley I will continue to monitor.

## 2011-10-04 NOTE — Progress Notes (Signed)
Clinical Social Worker continuing to follow for discharge planning needs.  Per chart, patient and patient family are hopeful for return to [facility] at discharge.  CSW spoke with facility who is able to extend the bed offer for Monday, February 25 - CSW notified Cardiology PA and treatment team is aware of patient discharge plan for Monday.  CSW has contacted facility and accepted bed offer for Monday.  CSW spoke with family who is agreeable and satisfied with the discharge plan to [facility].  Clinical Social Worker will remain available to facilitate patient discharge needs.  [name] [name], LCSWA [phone]  ([address] [place], [place] [phone])

## 2011-10-04 NOTE — Progress Notes (Signed)
Physical Therapy Treatment Patient Details Name: [name] [name] MRN: [id number] DOB: 1924-10-24 Today's Date: 10/04/2011  PT Assessment/Plan  PT - Assessment/Plan Comments on Treatment Session: Pt with limited mobility today secondary to lethargy. Pt responding more with increased arousal once EOB but continues not to be oriented. RN aware and pt's family visiting with pt end of session PT Plan: Discharge plan remains appropriate;Frequency remains appropriate Follow Up Recommendations: Skilled nursing facility PT Goals  Acute Rehab PT Goals PT Goal: Supine/Side to Sit - Progress: Not progressing PT Goal: Sit to Supine/Side - Progress: Not progressing PT Goal: Sit to Stand - Progress: Not progressing PT Goal: Stand to Sit - Progress: Progressing toward goal PT Goal: Ambulate - Progress: Progressing toward goal  PT Treatment Precautions/Restrictions  Precautions Precautions: Fall;ICD/Pacemaker Precaution Comments: limited LUE motion Required Braces or Orthoses:  (has sling secondary pacemaker sx) Restrictions Weight Bearing Restrictions: No Mobility (including Balance) Bed Mobility Supine to Sit: 2: Max assist;HOB flat Supine to Sit Details (indicate cue type and reason): max cueing for sequence and encouragement. Sit to Supine: 5: Supervision;HOB flat Sit to Supine - Details (indicate cue type and reason): Pt laid back onto bed after initial sitting and required 2 trials to maintain sitting EOB Transfers Sit to Stand: 3: Mod assist;From bed Sit to Stand Details (indicate cue type and reason): cueing for hand placement, safety, and assist for anterior translation Stand to Sit: 4: Min assist;To chair/3-in-1 Stand to Sit Details: cueing for hand placement and to control descent Ambulation/Gait Ambulation/Gait Assistance: 3: Mod assist Ambulation/Gait Assistance Details (indicate cue type and reason): facilitation to control direction of RW and to get pt to step into RW. Pt had to  be redirected 2x with ambulation not to let go of RW and grab handrail Ambulation Distance (Feet): 220 Feet Assistive device: Rolling walker Gait Pattern: Decreased stride length;Trunk flexed;Shuffle;Step-to pattern Gait velocity: slowly  Posture/Postural Control Posture/Postural Control: Postural limitations Postural Limitations: flexed trunk in standing Static Sitting Balance Static Sitting - Balance Support: No upper extremity supported;Feet supported Static Sitting - Level of Assistance: 5: Stand by assistance Exercise  General Exercises - Lower Extremity Short Arc QuadBarbaraann Boys;Both;Seated Awilda Bill) End of Session PT - End of Session Equipment Utilized During Treatment: Gait belt Activity Tolerance: Patient limited by fatigue;Other (comment) (pt limited by AMS, lethargy) Patient left: in chair;with call bell in reach Nurse Communication: Mobility status for transfers;Mobility status for ambulation General Behavior During Session: Lethargic Cognition: Impaired, at baseline (pt unaware of year, surgery, or first name)  Delorse Lek 10/04/2011, 12:59 PM Toney Sang, PT [phone]

## 2011-10-04 NOTE — Progress Notes (Signed)
ANTICOAGULATION CONSULT NOTE - Follow Up Consult  Pharmacy Consult for Coumadin Indication: atrial fibrillation  Allergies  Allergen Reactions  . Contrast Media (Iodinated Diagnostic Agents) Other (See Comments)    Reaction noted by md  . Heparin Other (See Comments)    Clots blood instead of thinning  . Hydromorphone Hcl Other (See Comments)    Crazy feeling  . Ramipril Hives    Patient Measurements: Height: 5\' 4"  (162.6 cm) Weight: 115 lb 4.8 oz (52.3 kg) IBW/kg (Calculated) : 54.7   Vital Signs: Temp: 98 F (36.7 C) (02/21 0550) Temp src: Oral (02/21 0550) BP: 142/74 mmHg (02/21 0550) Pulse Rate: 77  (02/21 0550)  Labs:  Basename 10/04/11 0445 10/03/11 0400 10/02/11 0530  HGB -- -- --  HCT -- -- --  PLT -- -- --  APTT -- -- --  LABPROT 26.6* 17.6* 19.3*  INR 2.41* 1.42 1.60*  HEPARINUNFRC -- -- --  CREATININE -- -- --  CKTOTAL -- -- --  CKMB -- -- --  TROPONINI -- -- --   Estimated Creatinine Clearance: 22.9 ml/min (by C-G formula based on Cr of 1.51).   Medications:  See med rec  Assessment: 78 yo F admitted with SOB 2/17, EMS found her to be in afib with RVR  Anticoagulation: Patient with chronic afib on coumadin PTA.  Received vitamin K 1mg  IV on 2/19-was 4.73 2/18). Home dose 2.5mg /day except 5mg  on Wed. S/p PPM placement on 2/19, New start  Amiodarone.  Sharp rise in INR today  to 2.41 (0.99 change) Plan: Will decrease Coumadin dose today.   Infectious Disease: afebrile, Post-op Ancef completed.   Cardiovascular: hx CAD w stent/CABG, PVD, PAF, HTN. has hx of SSS now s/p PPM 2/19. Echo: EF=35-40%, hr 77; bp elevated this am; on crestor, coreg, po dilt, amio (new); Lipid profile- HDL low, all others wnl  Endocrinology: NA  Gastrointestinal / Nutrition: Heart diet, PPI po   Neurology: sun-downing, xanax prn  Nephrology: CrCl~23, last SCr=1.51 on 2/18; I/O 920/450 (0.4 ml/kg/hr-decreased)  Pulmonary: RA  Hematology / Oncology: last hgb=10.2 on  2/18; No new labs  PTA Medication Issues: lasix  Best Practices: coumadin, PPI (on PTA), SCDs  Goal of Therapy:  INR 2-3   Plan:  1. Coumadin 1mg  po x1 tonight at 1800.  2. Follow-up PT/INR in AM.   Link Snuffer, Pharm.D., BCPS Clinical Pharmacist Pager: [phone] 10/04/2011, 9:10 AM

## 2011-10-04 NOTE — Progress Notes (Signed)
   CARE MANAGEMENT NOTE 10/04/2011  Patient:  CAIDANCE, SYBERT   Account Number:  [id number]  Date Initiated:  10/04/2011  Documentation initiated by:  GRAVES-BIGELOW,Isaiah Torok  Subjective/Objective Assessment:   Pt admitted with afib/flutter/ Shortness of breath. PT working with pt. Plan for SNF.     Action/Plan:   CSW working with pt and choice is for [facility].   Anticipated DC Date:  10/05/2011   Anticipated DC Plan:  SKILLED NURSING FACILITY      DC Planning Services  CM consult      Choice offered to / List presented to:             Status of service:  Completed, signed off Medicare Important Message given?   (If response is "NO", the following Medicare IM given date fields will be blank) Date Medicare IM given:   Date Additional Medicare IM given:    Discharge Disposition:  SKILLED NURSING FACILITY  Per UR Regulation:    Comments:

## 2011-10-04 NOTE — Progress Notes (Signed)
Bladder scan was done 908 ml results on scan bladder foley was inserted per order by PA.  A total of 1000 ml of output. Patient received dose of lasix foley to be removed tonight by night RN Dr. Johney Frame does not want to keep in for long time. Call bell within reach.

## 2011-10-05 LAB — URINALYSIS, ROUTINE W REFLEX MICROSCOPIC
Bilirubin Urine: NEGATIVE
Ketones, ur: NEGATIVE mg/dL
Leukocytes, UA: NEGATIVE
Nitrite: NEGATIVE
Protein, ur: NEGATIVE mg/dL
pH: 5.5 (ref 5.0–8.0)

## 2011-10-05 LAB — BASIC METABOLIC PANEL
CO2: 26 mEq/L (ref 19–32)
Chloride: 101 mEq/L (ref 96–112)
Glucose, Bld: 106 mg/dL — ABNORMAL HIGH (ref 70–99)
Potassium: 4 mEq/L (ref 3.5–5.1)
Sodium: 137 mEq/L (ref 135–145)

## 2011-10-05 MED ORDER — TRAMADOL HCL 50 MG PO TABS
50.0000 mg | ORAL_TABLET | Freq: Two times a day (BID) | ORAL | Status: DC | PRN
  Administered 2011-10-05: 50 mg via ORAL
  Filled 2011-10-05: qty 1

## 2011-10-05 MED ORDER — TRAMADOL HCL 50 MG PO TABS
50.0000 mg | ORAL_TABLET | Freq: Four times a day (QID) | ORAL | Status: DC | PRN
  Administered 2011-10-05 – 2011-10-07 (×4): 50 mg via ORAL
  Filled 2011-10-05 (×4): qty 1

## 2011-10-05 MED ORDER — DILTIAZEM HCL ER COATED BEADS 120 MG PO CP24
120.0000 mg | ORAL_CAPSULE | Freq: Every day | ORAL | Status: DC
  Administered 2011-10-05: 120 mg via ORAL
  Filled 2011-10-05 (×2): qty 1

## 2011-10-05 MED ORDER — POLYETHYLENE GLYCOL 3350 17 G PO PACK
17.0000 g | PACK | Freq: Every day | ORAL | Status: DC | PRN
  Administered 2011-10-05: 17 g via ORAL
  Filled 2011-10-05: qty 1

## 2011-10-05 MED ORDER — AMIODARONE HCL 200 MG PO TABS
200.0000 mg | ORAL_TABLET | Freq: Every day | ORAL | Status: DC
  Administered 2011-10-05 – 2011-10-06 (×2): 200 mg via ORAL
  Filled 2011-10-05 (×3): qty 1

## 2011-10-05 NOTE — Progress Notes (Signed)
Patient refused to get up to chair today.  Also refused to transfer by wheelchair to 3700; will move in bed.  Continues to retain urine; straight cathed for 1,000  Cc at 1630.

## 2011-10-05 NOTE — Progress Notes (Signed)
SUBJECTIVE: The patient is doing well today.  Less confused and more alert today  At this time, she denies chest pain, shortness of breath, or any new concerns.    Marland Kitchen amiodarone  200 mg Oral Daily  . carvedilol  25 mg Oral BID WC  . diltiazem  120 mg Oral Daily  . pantoprazole  40 mg Oral Q1200  . rosuvastatin  5 mg Oral q1800  . Vitamin D (Ergocalciferol)  50,000 Units Oral Q7 days  . warfarin  1 mg Oral ONCE-1800  . DISCONTD: amiodarone  200 mg Oral BID  . DISCONTD: diltiazem  180 mg Oral Daily  . DISCONTD: furosemide  40 mg Intravenous Daily  . DISCONTD: potassium chloride SA  20 mEq Oral Daily      OBJECTIVE: Physical Exam: Filed Vitals:   10/04/11 2046 10/05/11 0001 10/05/11 0349 10/05/11 0700  BP: 116/62 122/51 117/57 122/57  Pulse: 56 60 57 62  Temp: 97.8 F (36.6 C)  97.8 F (36.6 C) 97.9 F (36.6 C)  TempSrc: Oral  Oral Oral  Resp: 19 19 22 16   Height:      Weight:      SpO2: 99% 100% 92% 93%    Intake/Output Summary (Last 24 hours) at 10/05/11 0848 Last data filed at 10/05/11 5284  Gross per 24 hour  Intake    725 ml  Output   1450 ml  Net   -725 ml    Telemetry reveals afib, V pacing on demand  GEN- The patient is well appearing, alert   Head- normocephalic, atraumatic Eyes-  Sclera clear, conjunctiva pink Ears- hearing intact Oropharynx- clear Neck- supple, no JVP Lungs- Clear to ausculation bilaterally, normal work of breathing Heart- Regular rate and rhythm (paced) GI- soft, NT, ND, + BS Extremities- no clubbing, cyanosis, or edema Skin- pacemaker site without hematoma  LABS: Basic Metabolic Panel:  Basename 10/05/11 0348 10/04/11 1222  NA 137 136  K 4.0 4.8  CL 101 104  CO2 26 22  GLUCOSE 106* 114*  BUN 27* 24*  CREATININE 1.47* 1.29*  CALCIUM 10.0 10.0  MG -- --  PHOS -- --   Liver Function Tests: No results found for this basename: AST:2,ALT:2,ALKPHOS:2,BILITOT:2,PROT:2,ALBUMIN:2 in the last 72 hours No results found for this  basename: LIPASE:2,AMYLASE:2 in the last 72 hours CBC:  Basename 10/04/11 1222  WBC 13.4*  NEUTROABS 11.6*  HGB 9.9*  HCT 30.6*  MCV 87.9  PLT 270   ASSESSMENT AND PLAN:  Active Problems:  Atrial fibrillation  Bradycardia  pauses>8sec  Dyspnea on exertion  CHF (congestive heart failure)  1. Afib- rate better controlled Decrease amiodarone to 200mg  daily Goal INR 2-3 Decrease diltiazem to 120mg  daily Continue coreg  2. ACute on chronic combined CHF- Improved Hold lasix today  3. Acute renal failure Hold lasix Follow urine output and BMET  4. Tachy/brady- improved s/p PPM Routine wound care Will need wound check 10 days post implant Return to see Dr Antoine Poche in 2-4 weeks, Follow-up with me in 3 months  Plans for NH placement on Monday are noted  Hillis Range, MD 10/05/2011 8:48 AM

## 2011-10-05 NOTE — Progress Notes (Signed)
ANTICOAGULATION CONSULT NOTE - Follow Up Consult  Pharmacy Consult for Coumadin Indication: atrial fibrillation  Allergies  Allergen Reactions  . Contrast Media (Iodinated Diagnostic Agents) Other (See Comments)    Reaction noted by md  . Heparin Other (See Comments)    Clots blood instead of thinning  . Hydromorphone Hcl Other (See Comments)    Crazy feeling  . Ramipril Hives    Patient Measurements: Height: 5\' 4"  (162.6 cm) Weight: 115 lb 4.8 oz (52.3 kg) IBW/kg (Calculated) : 54.7   Vital Signs: Temp: 97.9 F (36.6 C) (02/22 0700) Temp src: Oral (02/22 0700) BP: 122/57 mmHg (02/22 0700) Pulse Rate: 62  (02/22 0700)  Labs:  Basename 10/05/11 0348 10/04/11 1222 10/04/11 0445 10/03/11 0400  HGB -- 9.9* -- --  HCT -- 30.6* -- --  PLT -- 270 -- --  APTT -- -- -- --  LABPROT 37.6* -- 26.6* 17.6*  INR 3.75* -- 2.41* 1.42  HEPARINUNFRC -- -- -- --  CREATININE 1.47* 1.29* -- --  CKTOTAL -- -- -- --  CKMB -- -- -- --  TROPONINI -- -- -- --   Estimated Creatinine Clearance: 23.5 ml/min (by C-G formula based on Cr of 1.47).   Medications:  See med rec  Assessment: 78 yo F admitted with SOB 2/17, EMS found her to be in afib with RVR  Medication Review:  Anticoagulation: Patient with chronic afib on coumadin PTA. Received vitamin K 1mg  IV on 2/19-was 4.73 2/18). Home dose 2.5mg /day except 5mg  on Wed. S/p PPM placement on 2/19, New start Amiodarone. Sharp rise in INR today to 3.75 despite much decreased dose. No bleeding noted.  Infectious Disease: afebrile, Post-op Ancef completed.  Cardiovascular: hx CAD w stent/CABG, CHF, PVD, PAF, HTN. has hx of SSS now s/p PPM 2/19. Echo: EF=35-40%, hr 77; bp elevated this am; on crestor, coreg, po dilt (decr to 120mg  daily), amio (new-decr to 200mg  daily); Lipid profile- HDL low, all others wnl. *Renal artery stenosis-no ACEI* Gastrointestinal / Nutrition: Heart diet, PPI po  Neurology: sun-downing, xanax prn Nephrology: ARF in  setting of known renal insufficiency; known renal artery stenosis s/p stenting: SCr up at 1.47, UOP~1.2 cc/kg/hr; I/O 750/1450  Pulmonary: RA Hematology / Oncology: Hgb 9.9, plt 270 PTA Medication Issues: lasix Best Practices: coumadin, PPI (on PTA), SCDs  Goal of Therapy:  INR 2-3   Plan:  1. No coumadin today.   2. Follow-up PT/INR in AM.   Link Snuffer, Pharm.D., BCPS Clinical Pharmacist Pager: [phone] 10/05/2011, 10:09 AM

## 2011-10-06 DIAGNOSIS — R748 Abnormal levels of other serum enzymes: Secondary | ICD-10-CM

## 2011-10-06 LAB — BASIC METABOLIC PANEL
BUN: 26 mg/dL — ABNORMAL HIGH (ref 6–23)
CO2: 25 mEq/L (ref 19–32)
Calcium: 9.8 mg/dL (ref 8.4–10.5)
GFR calc non Af Amer: 32 mL/min — ABNORMAL LOW (ref >90)
Glucose, Bld: 99 mg/dL (ref 70–99)
Potassium: 4.2 mEq/L (ref 3.5–5.1)
Sodium: 136 mEq/L (ref 135–145)

## 2011-10-06 MED ORDER — SPIRONOLACTONE 12.5 MG HALF TABLET
12.5000 mg | ORAL_TABLET | Freq: Every day | ORAL | Status: DC
  Administered 2011-10-06 – 2011-10-08 (×3): 12.5 mg via ORAL
  Filled 2011-10-06 (×4): qty 1

## 2011-10-06 NOTE — Progress Notes (Signed)
ANTICOAGULATION CONSULT NOTE - Follow Up Consult  Pharmacy Consult for Coumadin Indication: atrial fibrillation  Allergies  Allergen Reactions  . Contrast Media (Iodinated Diagnostic Agents) Other (See Comments)    Reaction noted by md  . Heparin Other (See Comments)    Clots blood instead of thinning  . Hydromorphone Hcl Other (See Comments)    Crazy feeling  . Ramipril Hives    Patient Measurements: Height: 5\' 4"  (162.6 cm) Weight: 115 lb 4.8 oz (52.3 kg) IBW/kg (Calculated) : 54.7   Vital Signs: Temp: 98 F (36.7 C) (02/23 0500) Temp src: Oral (02/23 0500) BP: 115/71 mmHg (02/23 0500) Pulse Rate: 60  (02/23 0500)  Labs:  Basename 10/06/11 0630 10/05/11 0348 10/04/11 1222 10/04/11 0445  HGB -- -- 9.9* --  HCT -- -- 30.6* --  PLT -- -- 270 --  APTT -- -- -- --  LABPROT 43.7* 37.6* -- 26.6*  INR 4.54* 3.75* -- 2.41*  HEPARINUNFRC -- -- -- --  CREATININE 1.44* 1.47* 1.29* --  CKTOTAL -- -- -- --  CKMB -- -- -- --  TROPONINI -- -- -- --   Estimated Creatinine Clearance: 24 ml/min (by C-G formula based on Cr of 1.44).   Medications:  See med rec  Assessment: 78 yo F admitted with SOB 2/17, EMS found her to be in afib with RVR  INR elevated this AM at 4.54   Goal of Therapy:  INR 2-3   Plan:  1. No coumadin today.   2. Follow-up PT/INR in AM.   Okey Regal, PharmD

## 2011-10-06 NOTE — Progress Notes (Addendum)
Patient Name: [name] [name]      SUBJECTIVE:admitted with AF and RVR and noted to have pauses.  S/p pacer and amio Also with CAD-s/p CABG  adn PVD.  EF 35-40% echo 2/13-mod MR  LAE  And PUL Htn   She is much improved walking without sob or chest pain  Past Medical History  Diagnosis Date  . Coronary artery disease     status post LAD stenting in 1999  . Hypertension   . Hyperlipidemia   . Carotid artery disease     status post right carotid endarterectomy.  . Degenerative joint disease   . Bradycardia     pauses>8 sec documented while on rate control  . Restless leg syndrome   . Mild renal insufficiency   . Heparin induced thrombocytopenia   . Diphtheria     "as a child"  . Pleural effusion, bilateral 06/2003    /e-chart  . NSTEMI (non-ST elevated myocardial infarction) 01/1998    /E-chart  . Pseudoaneurysm     status post repair following catherization in 1999  . CHF (congestive heart failure) 10/02/11    "~ once a year"  . Angina   . COPD (chronic obstructive pulmonary disease)   . Atrial fibrillation/flutter        . Tachycardia-bradycardia syndrome   . GERD (gastroesophageal reflux disease)   . Renal artery stenosis     bilateral- status post stenting  . Non-functioning kidney   . Depression     "cries alot"    PHYSICAL EXAM Filed Vitals:   10/05/11 0700 10/05/11 1151 10/05/11 2100 10/06/11 0500  BP: 122/57 136/71 131/76 115/71  Pulse: 62 65 64 60  Temp: 97.9 F (36.6 C) 97.5 F (36.4 C) 97.5 F (36.4 C) 98 F (36.7 C)  TempSrc: Oral Oral Oral Oral  Resp: 16 22 20 18   Height:      Weight:      SpO2: 93% 96% 96% 96%   Well developed and nourished in no acute distress HENT normal Neck supple with JVP 7 Pocket without hematoma Carotids brisk and full without bruits Clear Regular rate and rhythm, no murmurs or gallops Abd-soft with active BS without hepatomegaly No Clubbing cyanosis edema Skin-warm and dry A & Oriented  Grossly normal sensory  and motor function  TELEMETRY: Reviewed telemetry pt in  afib w pacing    Intake/Output Summary (Last 24 hours) at 10/06/11 0822 Last data filed at 10/06/11 0444  Gross per 24 hour  Intake    120 ml  Output   1250 ml  Net  -1130 ml    LABS: Basic Metabolic Panel:  Lab 10/06/11 9604 10/05/11 0348 10/04/11 1222 10/01/11 0251 09/30/11 0934  NA 136 137 136 138 136  K 4.2 4.0 4.8 3.9 4.3  CL 102 101 104 104 103  CO2 25 26 22 23 19   GLUCOSE 99 106* 114* 90 133*  BUN 26* 27* 24* 23 20  CREATININE 1.44* 1.47* 1.29* 1.51* 1.41*  CALCIUM 9.8 10.0 -- -- --  MG -- -- -- -- --  PHOS -- -- -- -- --   Cardiac Enzymes: No results found for this basename: CKTOTAL:3,CKMB:3,CKMBINDEX:3,TROPONINI:3 in the last 72 hours CBC:  Lab 10/04/11 1222 10/01/11 0251 09/30/11 0934  WBC 13.4* 4.9 6.3  NEUTROABS 11.6* -- 4.6  HGB 9.9* 10.2* 11.1*  HCT 30.6* 31.6* 33.9*  MCV 87.9 88.0 87.8  PLT 270 281 326   PROTIME:  Basename 10/06/11 0630 10/05/11 0348 10/04/11 0445  LABPROT 43.7* 37.6* 26.6*  INR 4.54* 3.75* 2.41*      ASSESSMENT AND PLAN:  Patient Active Hospital Problem List: Atrial fibrillation (07/12/2009)   Bradycardia  pauses>8sec (10/01/2011)   Iscehmic cardiomyopathy   CHF (congestive heart failure) (10/01/2011)  eleveatd INR    Improving  Will d/c dilt with relatively low ejection fraction Continue amiodarone with anticipated outpatient cardioversion Allergic reaction to ACE so will Add aldactone for LV dysfunction INR elevated will need close followup by pharmacy Check TSH Check BMET monday Signed, Sherryl Manges MD  10/06/2011

## 2011-10-07 DIAGNOSIS — I4891 Unspecified atrial fibrillation: Secondary | ICD-10-CM

## 2011-10-07 DIAGNOSIS — I2510 Atherosclerotic heart disease of native coronary artery without angina pectoris: Secondary | ICD-10-CM

## 2011-10-07 LAB — PROTIME-INR
INR: 3.62 — ABNORMAL HIGH (ref 0.00–1.49)
Prothrombin Time: 36.6 seconds — ABNORMAL HIGH (ref 11.6–15.2)

## 2011-10-07 MED ORDER — HYDROCODONE-ACETAMINOPHEN 5-325 MG PO TABS
1.0000 | ORAL_TABLET | Freq: Four times a day (QID) | ORAL | Status: DC | PRN
  Administered 2011-10-07 – 2011-10-08 (×4): 1 via ORAL
  Filled 2011-10-07 (×4): qty 1

## 2011-10-07 MED ORDER — AMIODARONE HCL 200 MG PO TABS
200.0000 mg | ORAL_TABLET | Freq: Two times a day (BID) | ORAL | Status: DC
  Administered 2011-10-07 (×2): 200 mg via ORAL
  Filled 2011-10-07 (×4): qty 1

## 2011-10-07 NOTE — Progress Notes (Signed)
Patient Name: [name] [name]      SUBJECTIVE:admitted with AF and RVR and noted to have pauses.  S/p pacer and amio  Also with CAD-s/p CABG  adn PVD.  EF 35-40% echo 2/13-mod MR  LAE  And PUL Htn adn CHF with ongoing diuresis as well as medication adjustments for cardiomyopathy   She is much improved walking without sob or chest pain;  C/o pain from her shoulder making sleep difficult  Past Medical History  Diagnosis Date  . Coronary artery disease     status post LAD stenting in 1999  . Hypertension   . Hyperlipidemia   . Carotid artery disease     status post right carotid endarterectomy.  . Degenerative joint disease   . Bradycardia     pauses>8 sec documented while on rate control  . Restless leg syndrome   . Mild renal insufficiency   . Heparin induced thrombocytopenia   . Diphtheria     "as a child"  . Pleural effusion, bilateral 06/2003    /e-chart  . NSTEMI (non-ST elevated myocardial infarction) 01/1998    /E-chart  . Pseudoaneurysm     status post repair following catherization in 1999  . CHF (congestive heart failure) 10/02/11    "~ once a year"  . Angina   . COPD (chronic obstructive pulmonary disease)   . Atrial fibrillation/flutter        . Tachycardia-bradycardia syndrome   . GERD (gastroesophageal reflux disease)   . Renal artery stenosis     bilateral- status post stenting  . Non-functioning kidney   . Depression     "cries alot"    PHYSICAL EXAM Filed Vitals:   10/06/11 0500 10/06/11 1345 10/06/11 2151 10/07/11 0339  BP: 115/71 137/79 128/83 124/69  Pulse: 60 66 60 78  Temp: 98 F (36.7 C) 98.1 F (36.7 C) 97.7 F (36.5 C) 98.2 F (36.8 C)  TempSrc: Oral Oral Oral Oral  Resp: 18 16 20 18   Height:      Weight:    116 lb 2.9 oz (52.7 kg)  SpO2: 96% 93% 96% 92%   Well developed and nourished in no acute distress HENT normal Neck supple with JVP 7 Pocket without hematoma Carotids brisk and full without bruits Clear Regular rate and  rhythm, no murmurs or gallops Abd-soft with active BS without hepatomegaly No Clubbing cyanosis edema Skin-warm and dry A & Oriented  Grossly normal sensory and motor function  TELEMETRY: Reviewed telemetry pt in  afib w pacing    Intake/Output Summary (Last 24 hours) at 10/07/11 0808 Last data filed at 10/07/11 0200  Gross per 24 hour  Intake    360 ml  Output    901 ml  Net   -541 ml    LABS: Basic Metabolic Panel:  Lab 10/06/11 1610 10/05/11 0348 10/04/11 1222 10/01/11 0251 09/30/11 0934  NA 136 137 136 138 136  K 4.2 4.0 4.8 3.9 4.3  CL 102 101 104 104 103  CO2 25 26 22 23 19   GLUCOSE 99 106* 114* 90 133*  BUN 26* 27* 24* 23 20  CREATININE 1.44* 1.47* 1.29* 1.51* 1.41*  CALCIUM 9.8 10.0 -- -- --  MG -- -- -- -- --  PHOS -- -- -- -- --   Cardiac Enzymes: No results found for this basename: CKTOTAL:3,CKMB:3,CKMBINDEX:3,TROPONINI:3 in the last 72 hours CBC:  Lab 10/04/11 1222 10/01/11 0251 09/30/11 0934  WBC 13.4* 4.9 6.3  NEUTROABS 11.6* -- 4.6  HGB  9.9* 10.2* 11.1*  HCT 30.6* 31.6* 33.9*  MCV 87.9 88.0 87.8  PLT 270 281 326   PROTIME:  Basename 10/07/11 0615 10/06/11 0630 10/05/11 0348  LABPROT 36.6* 43.7* 37.6*  INR 3.62* 4.54* 3.75*      ASSESSMENT AND PLAN:  Patient Active Hospital Problem List: Atrial fibrillation (07/12/2009)   Bradycardia  pauses>8sec (10/01/2011)   Iscehmic cardiomyopathy   CHF (congestive heart failure) (10/01/2011)  eleveatd INR    Improving  Will d/c dilt with relatively low ejection fraction Continue amiodarone with anticipated outpatient cardioversion  Needs auggmented rate control so wil incease amio for the short term Allergic reaction to ACE so will Add aldactone for LV dysfunction INR elevated will need close followup by pharmacy Check TSH Check BMET Monday Resume Pain meds for shoulder Signed, Sherryl Manges MD  10/07/2011

## 2011-10-07 NOTE — Progress Notes (Signed)
ANTICOAGULATION CONSULT NOTE - Follow Up Consult  Pharmacy Consult for Coumadin Indication: atrial fibrillation  Allergies  Allergen Reactions  . Contrast Media (Iodinated Diagnostic Agents) Other (See Comments)    Reaction noted by md  . Heparin Other (See Comments)    Clots blood instead of thinning  . Hydromorphone Hcl Other (See Comments)    Crazy feeling  . Ramipril Hives    Patient Measurements: Height: 5\' 4"  (162.6 cm) Weight: 116 lb 2.9 oz (52.7 kg) IBW/kg (Calculated) : 54.7   Vital Signs: Temp: 98.2 F (36.8 C) (02/24 0339) Temp src: Oral (02/24 0339) BP: 124/69 mmHg (02/24 0339) Pulse Rate: 78  (02/24 0339)  Labs:  Basename 10/07/11 0615 10/06/11 0630 10/05/11 0348 10/04/11 1222  HGB -- -- -- 9.9*  HCT -- -- -- 30.6*  PLT -- -- -- 270  APTT -- -- -- --  LABPROT 36.6* 43.7* 37.6* --  INR 3.62* 4.54* 3.75* --  HEPARINUNFRC -- -- -- --  CREATININE -- 1.44* 1.47* 1.29*  CKTOTAL -- -- -- --  CKMB -- -- -- --  TROPONINI -- -- -- --   Estimated Creatinine Clearance: 24.2 ml/min (by C-G formula based on Cr of 1.44).   Medications:  See med rec  Assessment: 78 yo F admitted with SOB 2/17, EMS found her to be in afib with RVR  INR still elevated this Am at 3.62   Goal of Therapy:  INR 2-3   Plan:  1. No coumadin today.   2. Follow-up PT/INR in AM.   Okey Regal, PharmD

## 2011-10-08 DIAGNOSIS — I4891 Unspecified atrial fibrillation: Principal | ICD-10-CM

## 2011-10-08 DIAGNOSIS — I495 Sick sinus syndrome: Secondary | ICD-10-CM

## 2011-10-08 DIAGNOSIS — I509 Heart failure, unspecified: Secondary | ICD-10-CM

## 2011-10-08 DIAGNOSIS — R0602 Shortness of breath: Secondary | ICD-10-CM

## 2011-10-08 LAB — BASIC METABOLIC PANEL
Calcium: 9.4 mg/dL (ref 8.4–10.5)
GFR calc non Af Amer: 36 mL/min — ABNORMAL LOW (ref >90)
Potassium: 4.1 mEq/L (ref 3.5–5.1)
Sodium: 136 mEq/L (ref 135–145)

## 2011-10-08 LAB — PROTIME-INR: INR: 2.38 — ABNORMAL HIGH (ref 0.00–1.49)

## 2011-10-08 MED ORDER — WARFARIN SODIUM 5 MG PO TABS: 2.5000 mg | ORAL_TABLET | ORAL | Status: DC

## 2011-10-08 MED ORDER — AMIODARONE HCL 200 MG PO TABS
200.0000 mg | ORAL_TABLET | Freq: Every day | ORAL | Status: DC
  Administered 2011-10-08: 200 mg via ORAL

## 2011-10-08 MED ORDER — DILTIAZEM HCL ER COATED BEADS 120 MG PO CP24
120.0000 mg | ORAL_CAPSULE | Freq: Every day | ORAL | Status: DC
  Administered 2011-10-08: 120 mg via ORAL
  Filled 2011-10-08: qty 1

## 2011-10-08 MED ORDER — FUROSEMIDE 40 MG PO TABS: 40.0000 mg | ORAL_TABLET | ORAL | Status: DC | PRN

## 2011-10-08 MED ORDER — CARVEDILOL 25 MG PO TABS: 25.0000 mg | ORAL_TABLET | Freq: Two times a day (BID) | ORAL | Status: DC

## 2011-10-08 MED ORDER — ROSUVASTATIN CALCIUM 5 MG PO TABS: 5.0000 mg | ORAL_TABLET | Freq: Every day | ORAL | Status: DC

## 2011-10-08 MED ORDER — POTASSIUM CHLORIDE CRYS ER 20 MEQ PO TBCR: 20.0000 meq | EXTENDED_RELEASE_TABLET | ORAL | Status: DC | PRN

## 2011-10-08 MED ORDER — AMIODARONE HCL 200 MG PO TABS: 200.0000 mg | ORAL_TABLET | Freq: Every day | ORAL | Status: DC

## 2011-10-08 MED ORDER — DILTIAZEM HCL ER COATED BEADS 120 MG PO CP24: 120.0000 mg | ORAL_CAPSULE | Freq: Every day | ORAL | Status: DC

## 2011-10-08 MED ORDER — SPIRONOLACTONE 12.5 MG HALF TABLET: 12.5000 mg | ORAL_TABLET | Freq: Every day | ORAL | Status: DC

## 2011-10-08 NOTE — Plan of Care (Signed)
Problem: Discharge Progression Outcomes Goal: Discharge plan in place and appropriate Outcome: Completed/Met Date Met:  10/08/11 To SNF Goal: Other Discharge Outcomes/Goals CHF teaching booklet given with wt chart. Pt being dc'd to SNF

## 2011-10-08 NOTE — Progress Notes (Signed)
Clinical Social Worker facilitated patient discharge by contacting family and facility. Patient will be transported via EMS to [facility], [place].   Patient's printed chart, EMS form, and facesheet will be in [place]. CSW will sign off as social work intervention is no longer needed.   Rozetta Nunnery MSW, [employer] [phone]

## 2011-10-08 NOTE — Progress Notes (Signed)
OT spoke to patient regarding DC and role of OT at SNF.  OT answered pts questions regarding rehab. Pt leaving for SNF today. Lise Auer, OT

## 2011-10-08 NOTE — Discharge Summary (Signed)
Discharge Summary   Patient ID: [name] [name],  MRN: [id number], DOB/AGE: February 04, 1925 78 y.o.  Admit date: 09/30/2011 Discharge date: 10/08/2011  Discharge Diagnoses Principal Problem:  *Atrial fibrillation with RVR Active Problems:  HYPERLIPIDEMIA  HYPERTENSION  Atrial fibrillation  Bradycardia  pauses>8sec  Dyspnea on exertion  CHF (congestive heart failure)   Allergies Allergies  Allergen Reactions  . Contrast Media (Iodinated Diagnostic Agents) Other (See Comments)    Reaction noted by md  . Heparin Other (See Comments)    Clots blood instead of thinning  . Hydromorphone Hcl Other (See Comments)    Crazy feeling  . Ramipril Hives    Procedures  2D echocardiogram with bubble study  Study Conclusions  - Left ventricle: The cavity size was normal. Wall thickness was increased in a pattern of mild LVH. Systolic function was moderately reduced. The estimated ejection fraction was in the range of 35% to 40%. Basal inferior, basal to mid posterior, and basal to mid anterolateral severe hypokinesis. - Aortic valve: There was no stenosis. Mild to moderate regurgitation. - Mitral valve: Moderate regurgitation. Posterior leaflet restriction by lateral wall motion abnormality (ischemic MR). - Left atrium: The atrium was moderately dilated. - Right ventricle: The cavity size was mildly dilated. Systolic function was mildly reduced. - Right atrium: The atrium was mildly dilated. - Tricuspid valve: Moderate regurgitation. Peak RV-RA gradient: 37mm Hg (S). - Pulmonary arteries: PA systolic pressure 43-47 mmHg. - Systemic veins: IVC measured 1.9 cm with normal respirophasic variation, suggesting RA pressure 6-10 mmHg. Impressions:  - Normal LV size with mild LV hypertrophy. EF 35-40% with wall motion abnormalities as noted above. Moderate MR, suspect ischemic MR related to lateral wall motion abnormality with restriction of the posterior leaflet. Mildly dilated RV  with mildly depressed systolic function. Mild pulmonary hypertension. Transthoracic echocardiography. M-mode, complete 2D, spectral Doppler, and color Doppler. Height: Height: 162.6cm. Height: 64in. Weight: Weight: 53.3kg. Weight: 117.3lb. Body mass index: BMI: 20.2kg/m^2. Body surface area: BSA: 1.36m^2. Blood pressure: 111/49. Patient status: Inpatient. Location: ICU/CCU  ------------------------------------------------------------  ------------------------------------------------------------ Left ventricle: The cavity size was normal. Wall thickness was increased in a pattern of mild LVH. Systolic function was moderately reduced. The estimated ejection fraction was in the range of 35% to 40%. Indeterminant diastolic function. Basal inferior, basal to mid posterior, and basal to mid anterolateral severe hypokinesis.  ------------------------------------------------------------ Aortic valve: Trileaflet; moderately calcified leaflets. Doppler: There was no stenosis. Mild to moderate regurgitation.  ------------------------------------------------------------ Aorta: Aortic root: The aortic root was normal in size. Ascending aorta: The ascending aorta was normal in size.  ------------------------------------------------------------ Mitral valve: Doppler: There was no evidence for stenosis. Moderate regurgitation. Posterior leaflet restriction by lateral wall motion abnormality (ischemic MR). Peak gradient: 4mm Hg (D).  ------------------------------------------------------------ Left atrium: The atrium was moderately dilated.  ------------------------------------------------------------ Right ventricle: The cavity size was mildly dilated. Systolic function was mildly reduced.  ------------------------------------------------------------ Pulmonic valve: Structurally normal valve. Cusp separation was normal. Doppler: Transvalvular velocity was within the normal range. Trivial  regurgitation.  ------------------------------------------------------------ Tricuspid valve: Doppler: Moderate regurgitation.  ------------------------------------------------------------ Pulmonary artery: PA systolic pressure 43-47 mmHg.  ------------------------------------------------------------ Right atrium: The atrium was mildly dilated.  ------------------------------------------------------------ Pericardium: There was no pericardial effusion.  ------------------------------------------------------------ Systemic veins: IVC measured 1.9 cm with normal respirophasic variation, suggesting RA pressure 6-10 mmHg.  ------------------------------------------------------------  2D measurements Normal Doppler measurements Normal Left ventricle Aortic valve LVID ED, 47.5 mm 43-52 Regurg vel, 309 cm/s ------ chord, ED PLAX Regurg PHT 529 ms ------ LVID ES, 40.1 mm  23-38 Regurg 38 mm ------ chord, gradient, ED Hg PLAX Mitral valve FS, chord, 16 % >29 Peak E vel 103 cm/s ------ PLAX Peak 4 mm ------ LVPW, ED 10.4 mm ------ gradient, D Hg IVS/LVPW 1.28 <1.3 Tricuspid valve ratio, ED Peak RV-RA 31 mm ------ Ventricular septum gradient, S Hg IVS, ED 13.3 mm ------ Aorta Root diam, 28 mm ------ ED Left atrium AP dim 46 mm ------ AP dim 2.97 cm/m^2 <2.2 index  PROCEDURES:  1. Pacemaker implantation.  INTRODUCTION: [name] [name] is a 78 y.o. female with a history of persistent atrial fibrillation and symptomatic tachycardia bradycardia syndrome who presents today for pacemaker implantation. The patient reports intermittent episodes of dizziness over the past few months. She has been documented to have pauses of 8 seconds as well as afib with RVR. The patient therefore presents today for pacemaker implantation.  DESCRIPTION OF PROCEDURE: Informed written consent was obtained, and the patient was brought to the electrophysiology lab in a fasting state. The patient required no  sedation for the procedure today. The patients left chest was prepped and draped in the usual sterile fashion by the EP lab staff. The skin overlying the left deltopectoral region was infiltrated with lidocaine for local analgesia. A 4-cm incision was made over the left deltopectoral region. A left subcutaneous pacemaker pocket was fashioned using a combination of sharp and blunt dissection. Electrocautery was required to assure hemostasis.  RA/RV Lead Placement:  The left axillary vein was visualized and cannulated with fluoroscopic visualization. Through the left axillary vein, a Medtronic model [id number] (serial number [id number] ) right atrial lead and a Medtronic model 5092- 58 (serial number [id number]) right ventricular lead were advanced with fluoroscopic visualization into the right atrial appendage and right ventricular apex positions respectively. Initial atrial lead afib- waves measured 0.7 mV with impedance of 656 ohms. Due to afib, a threshold could not be determinted. Right ventricular lead R-waves measured 13.7 mV with an impedance of 633 ohms and a threshold of 0.2 V at 0.5 msec. Both leads were secured to the pectoralis fascia using #2-0 silk over the suture sleeves.  Device Placement:  The leads were then connected to a Medtronic Adapta L model ADDRL 1 (serial number [id number]) pacemaker. The pocket was irrigated with copious gentamicin solution. The pacemaker was then placed into the pocket. The pocket was then closed in 2 layers with 2.0 Vicryl suture for the subcutaneous and subcuticular layers. Steri- Strips and a sterile dressing were then applied. There were no early apparent complications. No contrast was required for the procedure today.  CONCLUSIONS:  1. Successful implantation of a Medtronic Adapta L dual-chamber pacemaker for persistent afib and tachycardia bradycardia syndrome  2. No early apparent complications.   History of Present Illness  [name] [name] is a 78 yo Caucasian  female with PMHx significant for CAD (NSTEMI 1999 s/p LAD stenting), chronic mixed CHF, HTN, HL, PVD, persistent atrial fibrillation/flutter (s/p multiple prior DCCVs without NSR maintenance), SSS and tachycardia-bradycardia syndrome who was admitted to [facility] with a fib with RVR.   She had been feeling more fatigued for a few days prior to admission. She also complained of some intermittent chest pressure. Approximately 1:30 the morning of admission she reports feeling smothered and nothing catch her breath. She subsequently called EMS. Her rate was found to be 150-160 bpm. She was brought to the emergency department and given diltiazem IV, with appropriate rate response to low 100s. Her symptoms improved. Per the  patient's family, she reported left-sided pain and has not been eating well. She been tolerating her Coumadin well without conversant of bleeding. There was a note of possible heparin-induced pancytopenia in the past. There is no evidence of ischemia on EKG or initial enzymes. Chest x-ray in the ED revealed pulmonary edema and she was started on Lasix IV.  Hospital Course   She was subsequently admitted and continued on diltiazem IV. Her INR was found to be supratherapeutic and Coumadin was held initially. Overnight, telemetry revealed an 8 second pause in addition to atrial tachycardia with controlled ventricular response. The patient reported feeling flushed during this time but denied syncope. AV node blocking agents were held and an EP consult for consideration of permanent pacemaker was made.   The patient was noted to have sick sinus syndrome in the past, and the decision was made to proceed with pacemaker implantation. Her INR remained supratherapeutic and was given vitamin K. She was started on Amiodarone and Coreg. She was informed consented and agreed to the procedure. Prior to this, 2-D echocardiogram was performed revealing the above details including LVEF 35-40%, mild  LVH, basal inferior, basal to mid posterior basal to mid anterolateral severe hypokinesis, mild to moderate AI, moderate MR with restriction of the posterior leaflet, moderate TR. Mild pHTN was noted.   She underwent pacemaker implantation as outlined above without apparent complications and successful implantation of a Medtronic that the L dual-chamber pacemaker for persistent a fib and tachy-brady syndrome. That evening on telemetry, the patient was found to have atrial tachycardia with 140 bpm and occasional drops to 70-100 bpm. She was started on a Cardizem drip with successful rate-control.   That evening, the patient became agitated and confused. Upon speaking with the family the following morning, she has a history of sun downing. CXR did not reveal evidence of pneumothorax. Cardizem IV was switched to PO. Coumadin was resumed per pharmacy consult. PT was consulted due to a history of falls, and SNF was recommended on discharge. SW was consulted regarding discharge to a nursing facility.   She remained rate-controlled with occasional pacing as needed. INR was monitored and Coumadin adjusted by pharmacy accordingly. CXR revealed evidence of increasing pulmonary edema. Lasix was given on a PRN basis.   Her symptoms continued to improve. She was less confused leading up to her discharge. Cr trended up mildly and Lasix was held. Amiodarone and Diltiazem were adjusted. Today, she is stable, at baseline and will be discharged home. Several appointments have been made for her including pacemaker wound check, labwork, cardiology and EP follow-up. She will have her INR checked at her PCP office this week. This appointment has also been made for her. The recommendation has been made to stop Cardizem once back in sinus rhythm and consider a repeat DCCV at that time as an outpatient. Zocor was replaced with Crestor due to interaction with diltiazem and increased risk of rhabdomyolysis. Coumadin will be continued  at an adjusted dose per pharmacy today. Her INR fluctuated during this admission. As noted, she will have close follow-up of her INR. This information, including post-pacemaker supplemental discharge instructions have been outlined in her discharge sheet. She will be discharged to SNF today.  Discharge Vitals:  Blood pressure 137/91, pulse 99, temperature 98 F (36.7 C), temperature source Oral, resp. rate 14, height 5\' 4"  (1.626 m), weight 53.4 kg (117 lb 11.6 oz), SpO2 96.00%.   Weight change: 0.7 kg (1 lb 8.7 oz)  Labs:  Lab 10/08/11  1610 10/06/11 0630 10/05/11 0348  NA 136 136 137  K 4.1 4.2 4.0  CL 102 102 101  CO2 25 25 26   BUN 23 26* 27*  CREATININE 1.31* 1.44* 1.47*  CALCIUM 9.4 9.8 10.0  PROT -- -- --  BILITOT -- -- --  ALKPHOS -- -- --  ALT -- -- --  AST -- -- --  AMYLASE -- -- --  LIPASE -- -- --  GLUCOSE 98 99 106*    Basename 10/06/11 1100  TSH 2.684  T4TOTAL --  T3FREE --  THYROIDAB --    Disposition:  Discharge Orders    Future Appointments: Provider: Department: Dept Phone: Center:   10/11/2011 3:00 PM [facility] [place] [phone] [facility]   10/22/2011 9:50 AM Beatrice Lecher, PA [facility] [place] [phone] [facility]   01/17/2012 10:00 AM Gardiner Rhyme, MD [facility] [phone] [facility]     Follow-up Information    Follow up with Josue Hector, MD on 10/11/2011. (At 8:45 AM for INR check. )       Follow up with  [facility] on 10/11/2011. (At 3:00 PM for pacemaker wound check. Please arrive before this time for lab work. )    Solicitor information:   [address] [place] [address]       Follow up with Tereso Newcomer, PA on 10/22/2011. (At 9:50 AM for cardiology follow-up for this hospitalization. )    Contact information:   [address] [place] [address] [phone]       Follow up with Hillis Range, MD on 01/17/2012. (At 10:00  AM to check function of pacemaker. )    Contact information:   [address], [address] [place] [address] [phone]          Discharge Medications:  Medication List  As of 10/08/2011 10:00 AM   START taking these medications         amiodarone 200 MG tablet   Commonly known as: PACERONE   Take 1 tablet (200 mg total) by mouth daily.      diltiazem 120 MG 24 hr capsule   Commonly known as: CARDIZEM CD   Take 1 capsule (120 mg total) by mouth daily.      rosuvastatin 5 MG tablet   Commonly known as: CRESTOR   Take 1 tablet (5 mg total) by mouth daily at 6 PM.      spironolactone 12.5 mg Tabs   Commonly known as: ALDACTONE   Take 0.5 tablets (12.5 mg total) by mouth daily.         CHANGE how you take these medications         carvedilol 25 MG tablet   Commonly known as: COREG   Take 1 tablet (25 mg total) by mouth 2 (two) times daily with a meal.   What changed: - medication strength - dose      furosemide 40 MG tablet   Commonly known as: LASIX   Take 1 tablet (40 mg total) by mouth as needed (Please take 1 tablet if you experience increased weight gain (3 pounds in one day, or 5 pounds in two days), shortness of breath or swelling.).   What changed: - how often to take the med - reasons to take the med      potassium chloride SA 20 MEQ tablet   Commonly known as: K-DUR,KLOR-CON   Take 1 tablet (20 mEq total)  by mouth as needed (To be taken after as needed Lasix dose. ).   What changed: - how often to take the med - reasons to take the med      warfarin 5 MG tablet   Commonly known as: COUMADIN   Take 0.5 tablets (2.5 mg total) by mouth See admin instructions. Please take 2.5mg  (one half tablet) daily until INR checked at Coumadin clinic.   What changed: - dose - doctor's instructions         CONTINUE taking these medications         ALPRAZolam 0.25 MG tablet   Commonly known as: XANAX      bisacodyl 5 MG EC tablet   Commonly  known as: DULCOLAX      HYDROcodone-acetaminophen 7.5-325 MG per tablet   Commonly known as: NORCO      nitroGLYCERIN 0.4 MG SL tablet   Commonly known as: NITROSTAT      omeprazole 20 MG capsule   Commonly known as: PRILOSEC      Vitamin D (Ergocalciferol) 50000 UNITS Caps   Commonly known as: DRISDOL         STOP taking these medications         simvastatin 20 MG tablet          Where to get your medications    These are the prescriptions that you need to pick up. We sent them to a specific pharmacy, so you will need to go there to get them.   [id number] - [place], [place] - [address]    [address] [place] [address]    Phone: [phone]        amiodarone 200 MG tablet   carvedilol 25 MG tablet   diltiazem 120 MG 24 hr capsule   potassium chloride SA 20 MEQ tablet   rosuvastatin 5 MG tablet   spironolactone 12.5 mg Tabs         You may get these medications from any pharmacy.         furosemide 40 MG tablet   warfarin 5 MG tablet           Outstanding Labs/Studies: None  Duration of Discharge Encounter: 40 minutes including physician time.  Signed, R. Hurman Horn, PA-C 10/08/2011, 10:00 AM   I have seen, examined the patient, and reviewed the above assessment and plan.   Co Sign: Hillis Range, MD 10/10/2011 9:24 AM

## 2011-10-08 NOTE — Progress Notes (Signed)
Physical Therapy Treatment Patient Details Name: [name] [name] MRN: [id number] DOB: 03-21-1925 Today's Date: 10/08/2011  PT Assessment/Plan  PT - Assessment/Plan Comments on Treatment Session: Pt with Afib. Pt needed less assistance while and was able to walk without an assitive device today. She is improving her transfers and endurance.  PT Plan: Discharge plan remains appropriate;Frequency remains appropriate PT Frequency: Min 3X/week Follow Up Recommendations: Skilled nursing facility Equipment Recommended: Defer to next venue PT Goals  Acute Rehab PT Goals Time For Goal Achievement: 7 days PT Goal: Sit to Stand - Progress: Progressing toward goal PT Goal: Stand to Sit - Progress: Progressing toward goal PT Goal: Ambulate - Progress: Progressing toward goal  PT Treatment Precautions/Restrictions  Precautions Precautions: Fall;ICD/Pacemaker Precaution Comments: limited LUE motion Required Braces or Orthoses:  (has sling secondary pacemaker sx) Restrictions Weight Bearing Restrictions: No Mobility (including Balance) Bed Mobility Supine to Sit: Not tested (comment) Sit to Supine: Not Tested (comment) Transfers Transfers: Yes Sit to Stand: 4: Min assist;From chair/3-in-1 Sit to Stand Details (indicate cue type and reason): needing cueing for hand placement/progression; pt needing assistance getting COM over BOS when standing  Stand to Sit: To chair/3-in-1 Ambulation/Gait Ambulation/Gait: Yes Ambulation/Gait Assistance: 4: Min assist Ambulation/Gait Assistance Details (indicate cue type and reason): pt needing cueing on progression of exercise and to keep walking; pt had a few LOB when turning  Ambulation Distance (Feet): 200 Feet Assistive device: None Gait Pattern: Decreased stride length;Decreased step length - right;Decreased step length - left Gait velocity: slow Stairs: No Wheelchair Mobility Wheelchair Mobility: No  Posture/Postural Control Posture/Postural  Control: No significant limitations Balance Balance Assessed: No Exercise  General Exercises - Lower Extremity Short Arc Quad: AROM;Strengthening;Both;10 reps;Seated Hip ABduction/ADduction: AROM;Strengthening;Both;10 reps;Seated Hip Flexion/Marching: AROM;Strengthening;Both;10 reps;Seated Toe Raises: AROM;Strengthening;Both;10 reps;Seated Heel Raises: AROM;Strengthening;Both;10 reps;Seated End of Session PT - End of Session Equipment Utilized During Treatment: Gait belt Activity Tolerance: Patient tolerated treatment well Patient left: in chair;with call bell in reach Nurse Communication: Mobility status for transfers;Mobility status for ambulation General Behavior During Session: [facility] for tasks performed Cognition: [facility] for tasks performed  Elvera Bicker 10/08/2011, 1:12 PM

## 2011-10-08 NOTE — Progress Notes (Signed)
SUBJECTIVE: The patient is doing well today.  At this time, she denies chest pain, shortness of breath, or any new concerns.    Marland Kitchen amiodarone  200 mg Oral Daily  . carvedilol  25 mg Oral BID WC  . diltiazem  120 mg Oral Daily  . pantoprazole  40 mg Oral Q1200  . rosuvastatin  5 mg Oral q1800  . spironolactone  12.5 mg Oral Daily  . Vitamin D (Ergocalciferol)  50,000 Units Oral Q7 days  . DISCONTD: amiodarone  200 mg Oral Daily  . DISCONTD: amiodarone  200 mg Oral BID      OBJECTIVE: Physical Exam: Filed Vitals:   10/07/11 0339 10/07/11 1100 10/07/11 2200 10/08/11 0600  BP: 124/69 136/72 132/75 137/91  Pulse: 78 74 85 99  Temp: 98.2 F (36.8 C) 97.6 F (36.4 C) 97.7 F (36.5 C) 98 F (36.7 C)  TempSrc: Oral Oral    Resp: 18 19 18 14   Height:      Weight: 116 lb 2.9 oz (52.7 kg)   117 lb 11.6 oz (53.4 kg)  SpO2: 92% 94% 98% 96%    Intake/Output Summary (Last 24 hours) at 10/08/11 0757 Last data filed at 10/07/11 1815  Gross per 24 hour  Intake    440 ml  Output    200 ml  Net    240 ml    Telemetry reveals afib, V pacing on demand  GEN- The patient is well appearing, alert   Head- normocephalic, atraumatic Eyes-  Sclera clear, conjunctiva pink Ears- hearing intact Oropharynx- clear Neck- supple, no JVP Lungs- Clear to ausculation bilaterally, normal work of breathing Heart- Regular rate and rhythm (paced) GI- soft, NT, ND, + BS Extremities- no clubbing, cyanosis, or edema Skin- pacemaker site without hematoma  LABS: Basic Metabolic Panel:  Basename 10/06/11 0630  NA 136  K 4.2  CL 102  CO2 25  GLUCOSE 99  BUN 26*  CREATININE 1.44*  CALCIUM 9.8  MG --  PHOS --   Liver Function Tests: No results found for this basename: AST:2,ALT:2,ALKPHOS:2,BILITOT:2,PROT:2,ALBUMIN:2 in the last 72 hours No results found for this basename: LIPASE:2,AMYLASE:2 in the last 72 hours CBC: No results found for this basename: WBC:2,NEUTROABS:2,HGB:2,HCT:2,MCV:2,PLT:2 in  the last 72 hours ASSESSMENT AND PLAN:  Active Problems:  Atrial fibrillation  Bradycardia  pauses>8sec  Dyspnea on exertion  CHF (congestive heart failure)  1. Afib- rate controlled She does much better with low dose cardizem for rate control I am concerned that with higher doses of amiodarone that she would become amio toxic given her advanced age and small size. I will therefore decrease amiodarone back to 200mg  daily and restart cardizem CD 120mg  daily She should return to see Dr Antoine Poche in 2-4 weeks. Repeat cardioversion should be considered at that time.  I would stop cardizem once back in sinus.  Goal INR 2-3  2. ACute on chronic combined CHF- Improved Spironolactone added by Dr Graciela Husbands Will need BMET in 1 week Lasix only as needed  3. Acute renal failure Hold lasix BMET in 1 week  4. Tachy/brady- improved s/p PPM Routine wound care Will need wound check 10 days post implant Return to see Dr Antoine Poche in 2-4 weeks, Follow-up with me in 3 months  Plans for NH placement today  Hillis Range, MD 10/08/2011 7:57 AM

## 2011-10-08 NOTE — Progress Notes (Signed)
Finnis Colee Ingold,PT Acute Rehabilitation [phone] (pager)  

## 2011-10-08 NOTE — Discharge Instructions (Signed)
Supplemental Discharge Instructions for  Pacemaker Patients  Activity No heavy lifting or vigorous activity with your left/right arm for 4 to 6 weeks.  Do not raise your left/right arm above your head for one week.  Gradually raise your affected arm as drawn below.          May perform             May perform            May perform           On 10/09/11  NO DRIVING for 1 day; you may begin driving on 16/10/96. WOUND CARE   Keep the wound area clean and dry.  Do not get this area wet for one week. No showers for one week; you may shower on 10/09/11.   The tape/steri-strips on your wound will fall off; do not pull them off.  No bandage is needed on the site.  DO  NOT apply any creams, oils, or ointments to the wound area.   If you notice any drainage or discharge from the wound, any swelling or bruising at the site, or you develop a fever > 101? F after you are discharged home, call the office at once.  Special Instructions   You are still able to use cellular telephones; use the ear opposite the side where you have your pacemaker.  Avoid carrying your cellular phone near your device.   When traveling through airports, show security personnel your identification card to avoid being screened in the metal detectors.  Ask the security personnel to use the hand wand.   Avoid arc welding equipment, MRI testing (magnetic resonance imaging), TENS units (transcutaneous nerve stimulators).  Call the office for questions about other devices.   Avoid electrical appliances that are in poor condition or are not properly grounded.   Microwave ovens are safe to be near or to operate.  PLEASE REMEMBER TO BRING ALL OF YOUR MEDICATIONS TO EACH OF YOUR FOLLOW-UP OFFICE VISITS.

## 2011-10-09 ENCOUNTER — Encounter: Payer: Self-pay | Admitting: Physician Assistant

## 2011-10-11 ENCOUNTER — Encounter: Payer: Self-pay | Admitting: Internal Medicine

## 2011-10-11 ENCOUNTER — Ambulatory Visit (INDEPENDENT_AMBULATORY_CARE_PROVIDER_SITE_OTHER): Payer: PRIVATE HEALTH INSURANCE | Admitting: *Deleted

## 2011-10-11 DIAGNOSIS — I10 Essential (primary) hypertension: Secondary | ICD-10-CM

## 2011-10-11 DIAGNOSIS — I495 Sick sinus syndrome: Secondary | ICD-10-CM

## 2011-10-11 LAB — PACEMAKER DEVICE OBSERVATION
AL IMPEDENCE PM: 449 Ohm
ATRIAL PACING PM: 1
BRDY-0002RV: 60 {beats}/min
BRDY-0004RV: 110 {beats}/min
RV LEAD AMPLITUDE: 8 mv
RV LEAD THRESHOLD: 0.5 V

## 2011-10-11 NOTE — Progress Notes (Signed)
Wound check-PPM 

## 2011-10-12 LAB — BASIC METABOLIC PANEL
Chloride: 104 mEq/L (ref 96–112)
GFR: 34.09 mL/min — ABNORMAL LOW (ref >60.00)
Potassium: 4.5 mEq/L (ref 3.5–5.1)
Sodium: 135 mEq/L (ref 135–145)

## 2011-10-22 ENCOUNTER — Encounter: Payer: Self-pay | Admitting: *Deleted

## 2011-10-22 ENCOUNTER — Ambulatory Visit (INDEPENDENT_AMBULATORY_CARE_PROVIDER_SITE_OTHER): Payer: PRIVATE HEALTH INSURANCE | Admitting: Physician Assistant

## 2011-10-22 ENCOUNTER — Encounter: Payer: Self-pay | Admitting: Physician Assistant

## 2011-10-22 ENCOUNTER — Telehealth: Payer: Self-pay | Admitting: *Deleted

## 2011-10-22 VITALS — BP 120/72 | HR 77 | Ht 64.0 in | Wt 116.8 lb

## 2011-10-22 DIAGNOSIS — N259 Disorder resulting from impaired renal tubular function, unspecified: Secondary | ICD-10-CM

## 2011-10-22 DIAGNOSIS — I5023 Acute on chronic systolic (congestive) heart failure: Secondary | ICD-10-CM

## 2011-10-22 DIAGNOSIS — I4891 Unspecified atrial fibrillation: Secondary | ICD-10-CM

## 2011-10-22 DIAGNOSIS — I2510 Atherosclerotic heart disease of native coronary artery without angina pectoris: Secondary | ICD-10-CM

## 2011-10-22 DIAGNOSIS — I495 Sick sinus syndrome: Secondary | ICD-10-CM

## 2011-10-22 LAB — CBC WITH DIFFERENTIAL/PLATELET
Basophils Absolute: 0 10*3/uL (ref 0.0–0.1)
Eosinophils Absolute: 0.2 10*3/uL (ref 0.0–0.7)
Hemoglobin: 10.9 g/dL — ABNORMAL LOW (ref 12.0–15.0)
Lymphocytes Relative: 10.3 % — ABNORMAL LOW (ref 12.0–46.0)
Lymphs Abs: 0.7 10*3/uL (ref 0.7–4.0)
MCHC: 32 g/dL (ref 30.0–36.0)
Neutro Abs: 5.8 10*3/uL (ref 1.4–7.7)
Platelets: 235 10*3/uL (ref 150.0–400.0)
RDW: 18.2 % — ABNORMAL HIGH (ref 11.5–14.6)

## 2011-10-22 LAB — BASIC METABOLIC PANEL
BUN: 20 mg/dL (ref 6–23)
CO2: 24 mEq/L (ref 19–32)
Calcium: 9.3 mg/dL (ref 8.4–10.5)
Glucose, Bld: 93 mg/dL (ref 70–99)
Sodium: 132 mEq/L — ABNORMAL LOW (ref 135–145)

## 2011-10-22 MED ORDER — POTASSIUM CHLORIDE CRYS ER 20 MEQ PO TBCR: 20.0000 meq | EXTENDED_RELEASE_TABLET | Freq: Every day | ORAL | Status: DC

## 2011-10-22 MED ORDER — FUROSEMIDE 40 MG PO TABS: 40.0000 mg | ORAL_TABLET | Freq: Every day | ORAL | Status: DC

## 2011-10-22 NOTE — Progress Notes (Signed)
[address] [place], [place]  [address] Phone: [phone] Fax:  [phone]  Date:  10/22/2011   Name:  [name] [name]       DOB:  01/23/1925 MRN:  [id number]  PCP:  Dr. Nyland Primary Cardiologist:  Dr. James Hochrein  Primary Electrophysiologist:  Dr. James Allred    History of Present Illness: [name] [name] is a 78 y.o. female who presents for post hospital follow up.    She has a history of CAD, status post stenting to the LAD in 1999, status post CABG in 2004, hypertension, hyperlipidemia, atrial fibrillation/flutter, status post multiple cardioversions in the past, ischemic cardiomyopathy.  Last LHC 7/11: Proximal LAD 80%, mid 95%, distal 40%, mid R. High 40%, proximal RCA 40%, LIMA-LAD patent, SVG-PDA patent, EF 65%.  She was admitted 2/17-2/25 with atrial fibrillation with rapid ventricular rate.  This was complicated by acute systolic heart failure.  She was placed on diltiazem for rate control.  She demonstrated signs of sick sinus syndrome with an 8 second pause.  She was seen by electrophysiology and underwent pacemaker implantation.  She was also placed on amiodarone.  Her rate was difficult to control.  At discharge, Dr. Allred reduce her amiodarone dose due to concerns for toxicity with her low weight.  He recommended that the patient undergo repeat cardioversion in the near future.  He recommended discontinuing her diltiazem when she was back in sinus rhythm.  Echocardiogram 10/01/11: Mild LVH, EF 35-40%, basal inferior, basal to mid posterior and basal to mid anterolateral severe hypokinesis, mild to moderate AI, moderate MR (ischemic MR), moderate LAE, mild RVE, mildly reduced RV systolic function, mild RAE, PASP 43-44.  Labs: Potassium 4.1, creatinine 1.31, TSH 2.64, hemoglobin 9.9.  Chest x-ray demonstrated left lung base small nodular density.  The patient needs a repeat chest x-ray.  Followup labs 10/11/11: Potassium 4.5, creatinine 1.5.  She  is staying at [facility] for rehabilitation.  She is receiving Lasix as needed.  She tells me that over the last several days she feels worse.  She feels lethargic.  She feels short of breath.  She notes positive orthopnea.  She cannot sleep.  She denies lower extremity edema.  She has never had this with heart failure.  She denies syncope.  She feels her heart rate racing at times.  She denies chest discomfort.  Past Medical History  Diagnosis Date  . Coronary artery disease     status post LAD stenting in 1999 after NSTEMI; CABG 2004; Last LHC 7/11: Proximal LAD 80%, mid 95%, distal 40%, mid R. High 40%, proximal RCA 40%, LIMA-LAD patent, SVG-PDA patent, EF 65%.   . Hypertension   . Hyperlipidemia   . Carotid artery disease     status post right carotid endarterectomy.  . Degenerative joint disease   . Restless leg syndrome   . Mild renal insufficiency   . Heparin induced thrombocytopenia   . Diphtheria     "as a child"  . Pleural effusion, bilateral 06/2003    /e-chart  . Pseudoaneurysm     status post repair following catherization in 1999  . Chronic systolic heart failure 10/02/11    "~ once a year"  . Ischemic cardiomyopathy     Echocardiogram 10/01/11: Mild LVH, EF 35-40%, basal inferior, basal to mid posterior and basal to mid anterolateral severe hypokinesis, mild to moderate AI, moderate MR (ischemic MR), moderate LAE, mild RVE, mildly reduced RV systolic function, mild RAE, PASP   43-44  . COPD (chronic obstructive pulmonary disease)   . Atrial fibrillation/flutter     s/p multiple DCCVs;  amiodarone started 09/2011  . Tachycardia-bradycardia syndrome     s/p Medtronic pacemaker implant 09/2011 (8 sec pause noted)  . GERD (gastroesophageal reflux disease)   . Renal artery stenosis     bilateral- status post stenting  . Non-functioning kidney   . Depression     "cries alot"    Current Outpatient Prescriptions  Medication Sig Dispense Refill  . amiodarone (PACERONE)  200 MG tablet Take 1 tablet (200 mg total) by mouth daily.  30 tablet  3  . bisacodyl (DULCOLAX) 5 MG EC tablet Take 5 mg by mouth daily as needed. For constipation      . carvedilol (COREG) 25 MG tablet Take 1 tablet (25 mg total) by mouth 2 (two) times daily with a meal.  60 tablet  3  . diltiazem (CARDIZEM CD) 120 MG 24 hr capsule Take 1 capsule (120 mg total) by mouth daily.  30 capsule  3  . furosemide (LASIX) 40 MG tablet Take 1 tablet (40 mg total) by mouth as needed (Please take 1 tablet if you experience increased weight gain (3 pounds in one day, or 5 pounds in two days), shortness of breath or swelling.).  30 tablet  3  . HYDROcodone-acetaminophen (NORCO) 7.5-325 MG per tablet Take 1 tablet by mouth every 4 (four) hours as needed. For pain      . nitroGLYCERIN (NITROSTAT) 0.4 MG SL tablet Place 0.4 mg under the tongue every 5 (five) minutes as needed. For chest pain      . omeprazole (PRILOSEC) 20 MG capsule Take 20 mg by mouth daily.       . potassium chloride SA (K-DUR,KLOR-CON) 20 MEQ tablet Take 1 tablet (20 mEq total) by mouth as needed (To be taken after as needed Lasix dose. ).  30 tablet  3  . rosuvastatin (CRESTOR) 5 MG tablet Take 1 tablet (5 mg total) by mouth daily at 6 PM.  30 tablet  3  . spironolactone (ALDACTONE) 12.5 mg TABS Take 0.5 tablets (12.5 mg total) by mouth daily.  15 tablet  3  . Vitamin D, Ergocalciferol, (DRISDOL) 50000 UNITS CAPS Take 50,000 Units by mouth every 7 (seven) days. On Fridays      . warfarin (COUMADIN) 5 MG tablet Take 0.5 tablets (2.5 mg total) by mouth See admin instructions. Please take 2.5mg (one half tablet) daily until INR checked at Coumadin clinic.  30 tablet  1  . ALPRAZolam (XANAX) 0.25 MG tablet Take 0.25 mg by mouth at bedtime as needed. For sleep        Allergies: Allergies  Allergen Reactions  . Contrast Media (Iodinated Diagnostic Agents) Other (See Comments)    Reaction noted by md  . Heparin Other (See Comments)    Clots  blood instead of thinning  . Hydromorphone Hcl Other (See Comments)    Crazy feeling  . Ramipril Hives    History  Substance Use Topics  . Smoking status: Never Smoker   . Smokeless tobacco: Never Used  . Alcohol Use: No     ROS:  Please see the history of present illness.   She denies fevers, cough, vomiting, diarrhea, melena, hematochezia, hematemesis.  All other systems reviewed and negative.   PHYSICAL EXAM: VS:  BP 120/72  Pulse 77  Ht 5' 4" (1.626 m)  Wt 116 lb 12.8 oz (52.98 kg)  BMI 20.05   kg/m2 Well nourished, well developed, in no acute distress HEENT: normal Neck: + JVD At 90 degrees Cardiac:  Normal S1, S2, irregularly irregular, 2/6 holosystolic murmur at the apex Lungs:  Crackles in the bases bilaterally, no wheezing, rhonchi  Abd: soft, nontender  Ext: no edema Skin: warm and dry Neuro:  CNs 2-12 intact, no focal abnormalities noted  EKG:  Atrial fibrillation, heart rate 77, intermittent V. pacing  Laboratory:  Lab Results  Component Value Date   INR 2.38* 10/08/2011   INR 3.62* 10/07/2011   INR 4.54* 10/06/2011    INR 1.9 on 10/12/11 INR 3.3 on 10/22/11  ASSESSMENT AND PLAN:  1. Acute on chronic systolic heart failure  She has evidence of recurrent volume overload.  I will adjust her Lasix to 40 mg daily and potassium 20 mEq daily.  Check a basic metabolic panel today and repeat a basic metabolic panel in one week.  Followup with Dr. Hochrein or me in the next one to 2 weeks.   2. Atrial fibrillation  I discussed her case today with Dr. Allred.  She does better in sinus rhythm.  We checked her INR today and it was 3.3.  Her INR at the nursing home 3/1 was 1.9.  She will require transesophageal echocardiogram guided cardioversion.  We will arrange this this week in order to restore normal sinus rhythm.  We will need to get her off of the diltiazem after this.  Hopefully her heart failure were also improved with restoration of normal sinus rhythm.  She will  remain on low-dose amiodarone.  She has been taking this for about 3 weeks now so should be fully loaded.  She will need weekly INR checks.  We will communicate this with the nursing home.   3. CAD  No angina.   4. Tachycardia-bradycardia syndrome  Status post pacemaker.   5. RENAL INSUFFICIENCY  Keep a close on renal function and potassium with adjustments in her Lasix.   6.  Abnormal CXR      She will need a repeat CXR arranged at next follow up visit.    Signed, Sharonlee Nine, PA-C  9:59 AM 10/22/2011    

## 2011-10-22 NOTE — Patient Instructions (Addendum)
Your physician recommends that you schedule a follow-up appointment in: 1-2 weeks  Your physician recommends that you have lab work drawn today (BMP and CBC) and in 1 week (BMP) Your physician has recommended you make the following change in your medication: START Lasix 40 mg daily and Potassium 20 mEq daily Please have INR checked weekly  We are scheduling a TEE/Cardioversion

## 2011-10-22 NOTE — Telephone Encounter (Signed)
Message copied by Tarri Fuller on Mon Oct 22, 2011  5:13 PM ------      Message from: [place], [place] T      Created: Mon Oct 22, 2011  4:15 PM       Creatinine up some      Lasix dose increased at office visit today      Since she is on spironolactone and creatinine high, DO NOT TAKE potassium supplement for now      Have her BMET checked on Thursday 3/14 instead of next week      Hgb stable      Tereso Newcomer, PA-C  4:13 PM 10/22/2011

## 2011-10-22 NOTE — Telephone Encounter (Signed)
s/w nurse today @ nursing home and gave lab results and changes. will fax results and ov note from today. Danielle Rankin, CMA S/w Lupita Leash the nurse today who was caring for pt and went over med changes, nurse gave verbal read back and understanding today. Wendee Copp

## 2011-10-23 ENCOUNTER — Encounter (HOSPITAL_COMMUNITY)
Admission: RE | Disposition: A | Discharge status: Home Health | Payer: Self-pay | Source: Ambulatory Visit | Attending: Cardiovascular Disease

## 2011-10-23 ENCOUNTER — Ambulatory Visit (HOSPITAL_COMMUNITY)
Admission: RE | Admit: 2011-10-23 | Discharge: 2011-10-23 | Disposition: A | Discharge status: Home Health | Payer: Medicare (Managed Care) | Source: Ambulatory Visit | Attending: Cardiovascular Disease | Admitting: Cardiovascular Disease

## 2011-10-23 ENCOUNTER — Encounter (HOSPITAL_COMMUNITY): Payer: Self-pay | Admitting: *Deleted

## 2011-10-23 DIAGNOSIS — I4891 Unspecified atrial fibrillation: Secondary | ICD-10-CM

## 2011-10-23 HISTORY — PX: TEE WITHOUT CARDIOVERSION: SHX5443

## 2011-10-23 HISTORY — PX: CARDIOVERSION: SHX1299

## 2011-10-23 SURGERY — CARDIOVERSION
Anesthesia: Monitor Anesthesia Care

## 2011-10-23 SURGERY — ECHOCARDIOGRAM, TRANSESOPHAGEAL
Anesthesia: Moderate Sedation

## 2011-10-23 MED ORDER — FENTANYL CITRATE 0.05 MG/ML IJ SOLN
INTRAMUSCULAR | Status: DC | PRN
  Administered 2011-10-23 (×2): 12.5 ug via INTRAVENOUS

## 2011-10-23 MED ORDER — MIDAZOLAM HCL 10 MG/2ML IJ SOLN: 10.0000 mg | Freq: Once | INTRAMUSCULAR | Status: DC

## 2011-10-23 MED ORDER — BENZOCAINE 20 % MT SOLN: 1.0000 "application " | OROMUCOSAL | Status: DC | PRN

## 2011-10-23 MED ORDER — MIDAZOLAM HCL 10 MG/2ML IJ SOLN
INTRAMUSCULAR | Status: DC | PRN
  Administered 2011-10-23 (×3): 1 mg via INTRAVENOUS

## 2011-10-23 MED ORDER — SODIUM CHLORIDE 0.9 % IJ SOLN: 3.0000 mL | INTRAMUSCULAR | Status: DC | PRN

## 2011-10-23 MED ORDER — SODIUM CHLORIDE 0.9 % IV SOLN: 250.0000 mL | INTRAVENOUS | Status: DC | PRN

## 2011-10-23 MED ORDER — MIDAZOLAM HCL 10 MG/2ML IJ SOLN
INTRAMUSCULAR | Status: AC
  Filled 2011-10-23: qty 4

## 2011-10-23 MED ORDER — FENTANYL CITRATE 0.05 MG/ML IJ SOLN: 250.0000 ug | Freq: Once | INTRAMUSCULAR | Status: DC

## 2011-10-23 MED ORDER — BUTAMBEN-TETRACAINE-BENZOCAINE 2-2-14 % EX AERO
INHALATION_SPRAY | CUTANEOUS | Status: DC | PRN
  Administered 2011-10-23: 2 via TOPICAL

## 2011-10-23 MED ORDER — SODIUM CHLORIDE 0.9 % IJ SOLN: 3.0000 mL | Freq: Two times a day (BID) | INTRAMUSCULAR | Status: DC

## 2011-10-23 MED ORDER — FENTANYL CITRATE 0.05 MG/ML IJ SOLN
INTRAMUSCULAR | Status: AC
  Filled 2011-10-23: qty 4

## 2011-10-23 MED ORDER — SODIUM CHLORIDE 0.45 % IV SOLN
INTRAVENOUS | Status: DC
  Administered 2011-10-23: 500 mL via INTRAVENOUS

## 2011-10-23 NOTE — Interval H&P Note (Signed)
History and Physical Interval Note:  10/23/2011 1:59 PM  [name] [name]  has presented today for surgery, with the diagnosis of a-tri fib  The various methods of treatment have been discussed with the patient and family. After consideration of risks, benefits and other options for treatment, the patient has consented to  Procedure(s) (LRB): TRANSESOPHAGEAL ECHOCARDIOGRAM (TEE) (N/A) CARDIOVERSION (N/A) as a surgical intervention .  The patients' history has been reviewed, patient examined, no change in status, stable for surgery.  I have reviewed the patients' chart and labs.  Questions were answered to the patient's satisfaction.     Charlton Haws

## 2011-10-23 NOTE — Brief Op Note (Signed)
See operative note  Andrw Mcguirt  

## 2011-10-23 NOTE — Interval H&P Note (Signed)
History and Physical Interval Note:  10/23/2011 1:58 PM  [name] [name]  has presented today for surgery, with the diagnosis of a-tri fib  The various methods of treatment have been discussed with the patient and family. After consideration of risks, benefits and other options for treatment, the patient has consented to  Procedure(s) (LRB): TRANSESOPHAGEAL ECHOCARDIOGRAM (TEE) (N/A) CARDIOVERSION (N/A) as a surgical intervention .  The patients' history has been reviewed, patient examined, no change in status, stable for surgery.  I have reviewed the patients' chart and labs.  Questions were answered to the patient's satisfaction.     Charlton Haws

## 2011-10-23 NOTE — H&P (View-Only) (Signed)
[address] [place], [place]  [address] Phone: [phone] Fax:  [phone]  Date:  10/22/2011   Name:  [name] [name]       DOB:  [birth year] MRN:  [id number]  PCP:  Dr. Lysbeth Galas Primary Cardiologist:  Dr. Rollene Rotunda  Primary Electrophysiologist:  Dr. Hillis Range    History of Present Illness: [name] [name] is a 78 y.o. female who presents for post hospital follow up.    She has a history of CAD, status post stenting to the LAD in 1999, status post CABG in 2004, hypertension, hyperlipidemia, atrial fibrillation/flutter, status post multiple cardioversions in the past, ischemic cardiomyopathy.  Last LHC 7/11: Proximal LAD 80%, mid 95%, distal 40%, mid R. High 40%, proximal RCA 40%, LIMA-LAD patent, SVG-PDA patent, EF 65%.  She was admitted 2/17-2/25 with atrial fibrillation with rapid ventricular rate.  This was complicated by acute systolic heart failure.  She was placed on diltiazem for rate control.  She demonstrated signs of sick sinus syndrome with an 8 second pause.  She was seen by electrophysiology and underwent pacemaker implantation.  She was also placed on amiodarone.  Her rate was difficult to control.  At discharge, Dr. Johney Frame reduce her amiodarone dose due to concerns for toxicity with her low weight.  He recommended that the patient undergo repeat cardioversion in the near future.  He recommended discontinuing her diltiazem when she was back in sinus rhythm.  Echocardiogram 10/01/11: Mild LVH, EF 35-40%, basal inferior, basal to mid posterior and basal to mid anterolateral severe hypokinesis, mild to moderate AI, moderate MR (ischemic MR), moderate LAE, mild RVE, mildly reduced RV systolic function, mild RAE, PASP 43-44.  Labs: Potassium 4.1, creatinine 1.31, TSH 2.64, hemoglobin 9.9.  Chest x-ray demonstrated left lung base small nodular density.  The patient needs a repeat chest x-ray.  Followup labs 10/11/11: Potassium 4.5, creatinine 1.5.  She  is staying at [facility] for rehabilitation.  She is receiving Lasix as needed.  She tells me that over the last several days she feels worse.  She feels lethargic.  She feels short of breath.  She notes positive orthopnea.  She cannot sleep.  She denies lower extremity edema.  She has never had this with heart failure.  She denies syncope.  She feels her heart rate racing at times.  She denies chest discomfort.  Past Medical History  Diagnosis Date  . Coronary artery disease     status post LAD stenting in 1999 after NSTEMI; CABG 2004; Last LHC 7/11: Proximal LAD 80%, mid 95%, distal 40%, mid R. High 40%, proximal RCA 40%, LIMA-LAD patent, SVG-PDA patent, EF 65%.   . Hypertension   . Hyperlipidemia   . Carotid artery disease     status post right carotid endarterectomy.  . Degenerative joint disease   . Restless leg syndrome   . Mild renal insufficiency   . Heparin induced thrombocytopenia   . Diphtheria     "as a child"  . Pleural effusion, bilateral 06/2003    /e-chart  . Pseudoaneurysm     status post repair following catherization in 1999  . Chronic systolic heart failure 10/02/11    "~ once a year"  . Ischemic cardiomyopathy     Echocardiogram 10/01/11: Mild LVH, EF 35-40%, basal inferior, basal to mid posterior and basal to mid anterolateral severe hypokinesis, mild to moderate AI, moderate MR (ischemic MR), moderate LAE, mild RVE, mildly reduced RV systolic function, mild RAE, PASP  43-44  . COPD (chronic obstructive pulmonary disease)   . Atrial fibrillation/flutter     s/p multiple DCCVs;  amiodarone started 09/2011  . Tachycardia-bradycardia syndrome     s/p Medtronic pacemaker implant 09/2011 (8 sec pause noted)  . GERD (gastroesophageal reflux disease)   . Renal artery stenosis     bilateral- status post stenting  . Non-functioning kidney   . Depression     "cries alot"    Current Outpatient Prescriptions  Medication Sig Dispense Refill  . amiodarone (PACERONE)  200 MG tablet Take 1 tablet (200 mg total) by mouth daily.  30 tablet  3  . bisacodyl (DULCOLAX) 5 MG EC tablet Take 5 mg by mouth daily as needed. For constipation      . carvedilol (COREG) 25 MG tablet Take 1 tablet (25 mg total) by mouth 2 (two) times daily with a meal.  60 tablet  3  . diltiazem (CARDIZEM CD) 120 MG 24 hr capsule Take 1 capsule (120 mg total) by mouth daily.  30 capsule  3  . furosemide (LASIX) 40 MG tablet Take 1 tablet (40 mg total) by mouth as needed (Please take 1 tablet if you experience increased weight gain (3 pounds in one day, or 5 pounds in two days), shortness of breath or swelling.).  30 tablet  3  . HYDROcodone-acetaminophen (NORCO) 7.5-325 MG per tablet Take 1 tablet by mouth every 4 (four) hours as needed. For pain      . nitroGLYCERIN (NITROSTAT) 0.4 MG SL tablet Place 0.4 mg under the tongue every 5 (five) minutes as needed. For chest pain      . omeprazole (PRILOSEC) 20 MG capsule Take 20 mg by mouth daily.       . potassium chloride SA (K-DUR,KLOR-CON) 20 MEQ tablet Take 1 tablet (20 mEq total) by mouth as needed (To be taken after as needed Lasix dose. ).  30 tablet  3  . rosuvastatin (CRESTOR) 5 MG tablet Take 1 tablet (5 mg total) by mouth daily at 6 PM.  30 tablet  3  . spironolactone (ALDACTONE) 12.5 mg TABS Take 0.5 tablets (12.5 mg total) by mouth daily.  15 tablet  3  . Vitamin D, Ergocalciferol, (DRISDOL) 50000 UNITS CAPS Take 50,000 Units by mouth every 7 (seven) days. On Fridays      . warfarin (COUMADIN) 5 MG tablet Take 0.5 tablets (2.5 mg total) by mouth See admin instructions. Please take 2.5mg  (one half tablet) daily until INR checked at Coumadin clinic.  30 tablet  1  . ALPRAZolam (XANAX) 0.25 MG tablet Take 0.25 mg by mouth at bedtime as needed. For sleep        Allergies: Allergies  Allergen Reactions  . Contrast Media (Iodinated Diagnostic Agents) Other (See Comments)    Reaction noted by md  . Heparin Other (See Comments)    Clots  blood instead of thinning  . Hydromorphone Hcl Other (See Comments)    Crazy feeling  . Ramipril Hives    History  Substance Use Topics  . Smoking status: Never Smoker   . Smokeless tobacco: Never Used  . Alcohol Use: No     ROS:  Please see the history of present illness.   She denies fevers, cough, vomiting, diarrhea, melena, hematochezia, hematemesis.  All other systems reviewed and negative.   PHYSICAL EXAM: VS:  BP 120/72  Pulse 77  Ht 5\' 4"  (1.626 m)  Wt 116 lb 12.8 oz (52.98 kg)  BMI 20.05  kg/m2 Well nourished, well developed, in no acute distress HEENT: normal Neck: + JVD At 90 degrees Cardiac:  Normal S1, S2, irregularly irregular, 2/6 holosystolic murmur at the apex Lungs:  Crackles in the bases bilaterally, no wheezing, rhonchi  Abd: soft, nontender  Ext: no edema Skin: warm and dry Neuro:  CNs 2-12 intact, no focal abnormalities noted  EKG:  Atrial fibrillation, heart rate 77, intermittent V. pacing  Laboratory:  Lab Results  Component Value Date   INR 2.38* 10/08/2011   INR 3.62* 10/07/2011   INR 4.54* 10/06/2011    INR 1.9 on 10/12/11 INR 3.3 on 10/22/11  ASSESSMENT AND PLAN:  1. Acute on chronic systolic heart failure  She has evidence of recurrent volume overload.  I will adjust her Lasix to 40 mg daily and potassium 20 mEq daily.  Check a basic metabolic panel today and repeat a basic metabolic panel in one week.  Followup with Dr. Antoine Poche or me in the next one to 2 weeks.   2. Atrial fibrillation  I discussed her case today with Dr. Johney Frame.  She does better in sinus rhythm.  We checked her INR today and it was 3.3.  Her INR at the nursing home 3/1 was 1.9.  She will require transesophageal echocardiogram guided cardioversion.  We will arrange this this week in order to restore normal sinus rhythm.  We will need to get her off of the diltiazem after this.  Hopefully her heart failure were also improved with restoration of normal sinus rhythm.  She will  remain on low-dose amiodarone.  She has been taking this for about 3 weeks now so should be fully loaded.  She will need weekly INR checks.  We will communicate this with the nursing home.   3. CAD  No angina.   4. Tachycardia-bradycardia syndrome  Status post pacemaker.   5. RENAL INSUFFICIENCY  Keep a close on renal function and potassium with adjustments in her Lasix.   6.  Abnormal CXR      She will need a repeat CXR arranged at next follow up visit.    Signed, Tereso Newcomer, PA-C  9:59 AM 10/22/2011

## 2011-10-23 NOTE — H&P (View-Only) (Signed)
Admit date: 09/30/2011 Referring Physician [facility] ED Primary Cardiologist Dr. Antoine Poche Chief complaint/reason for admission: Shortness of breath  HPI: 78 year old woman with a history of coronary artery disease, peripheral vascular disease and paroxysmal atrial fibrillation.  Per report, she has been shocked several times to try and get back into normal sinus rhythm.  She maintained sinus rhythm for the most part.  She had been feeling somewhat fatigued over the past few days.  She had some intermittent chest pressure.  This morning at about 1:30, she felt smothered.  She could not catch her breath.  She called EMS and their initial rhythm strips showed atrial fibrillation with rapid ventricular response.  She was brought to the emergency room and has been treated with IV diltiazem.  She is now feeling much better.  Her heart rate has come down into the low 100s.  Her heart rate was in the 150s to 160s range before the medication.  She currently does not have any chest pain or pressure.  She has had some left side pain.  Her family reports that she has not been eating well.  She has been tolerating Coumadin well.  She denies any bleeding problems.  She does not require a lot of changes to her Coumadin dosing her report.  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    PMH:    Past Medical History  Diagnosis Date  . Coronary artery disease     status post LAD stenting in 1999  . Hypertension   . Hyperlipidemia   . Diabetes mellitus   . Renal artery stenosis     bilateral- status post stenting  . Carotid artery disease     status post right carotid endarterectomy.  . Degenerative joint disease   . Pseudoaneurysm     status post repair following catherization in 1999  . Mild mitral regurgitation by prior echocardiogram   . Mild renal insufficiency   . Restless leg syndrome     PSH:    Past Surgical History  Procedure Date  . Coronary artery bypass graft  2004  . Carotid endarterectomy     right  . Cholecystectomy, laparoscopic   . Pseudoaneurysm repair   . Rotator cuff repair     right    ALLERGIES:   Contrast media; Heparin; Hydromorphone hcl; and Ramipril  Prior to Admit Meds:   (Not in a hospital admission) Family HX:    Family History  Problem Relation Age of Onset  . Cancer Mother 53    died  . Lung disease Father 65    died  . Heart disease Sister     2 sisters and her son   Social HX:    No smoking.   ROS:  All 11 ROS were addressed and are negative except what is stated in the HPI  PHYSICAL EXAM Filed Vitals:   09/30/11 1200  BP: 149/74  Pulse: 46  Temp:   Resp: 26   General: Frail Head:   Normal cephalic and atramatic  Lungs:  Bibasilar crackles Heart:  Tachycardic, irregular rhythm, S1-S2 Abdomen: abdomen soft, mild left sided tenderness.  No rebound, no guarding  Extremities:   No  edema.   Neuro: Alert and oriented  Psych:  Good affect, responds appropriately   Labs:   Lab Results  Component Value Date   WBC 6.3 09/30/2011   HGB 11.1* 09/30/2011   HCT 33.9* 09/30/2011   MCV 87.8 09/30/2011   PLT  326 09/30/2011    Lab 09/30/11 0934  NA 136  K 4.3  CL 103  CO2 19  BUN 20  CREATININE 1.41*  CALCIUM 9.6  PROT 7.3  BILITOT 0.9  ALKPHOS 50  ALT <5  AST 15  GLUCOSE 133*   Lab Results  Component Value Date   CKTOTAL 26 09/30/2011   CKMB 1.6 09/30/2011   TROPONINI <0.30 09/30/2011   No results found for this basename: PTT   Lab Results  Component Value Date   INR 4.84* 09/30/2011   INR 2.90* 06/14/2011   INR 2.76* 06/13/2011     Lab Results  Component Value Date   CHOL  Value: 123        ATP III CLASSIFICATION:  <200     mg/dL   Desirable  161-096  mg/dL   Borderline High  >=045    mg/dL   High        11/19/8117   CHOL  Value: 118        ATP III CLASSIFICATION:  <200     mg/dL   Desirable  147-829  mg/dL   Borderline High  >=562    mg/dL   High        09/12/8655   CHOL  Value: 169         ATP III CLASSIFICATION:  <200     mg/dL   Desirable  846-962  mg/dL   Borderline High  >=952    mg/dL   High        8/41/3244   Lab Results  Component Value Date   HDL 41 03/03/2010   HDL 19* 09/23/2009   HDL 35* 08/31/2009   Lab Results  Component Value Date   LDLCALC  Value: 70        Total Cholesterol/HDL:CHD Risk Coronary Heart Disease Risk Table                     Men   Women  1/2 Average Risk   3.4   3.3  Average Risk       5.0   4.4  2 X Average Risk   9.6   7.1  3 X Average Risk  23.4   11.0        Use the calculated Patient Ratio above and the CHD Risk Table to determine the patient's CHD Risk.        ATP III CLASSIFICATION (LDL):  <100     mg/dL   Optimal  010-272  mg/dL   Near or Above                    Optimal  130-159  mg/dL   Borderline  536-644  mg/dL   High  >034     mg/dL   Very High 7/42/5956   LDLCALC  Value: 83        Total Cholesterol/HDL:CHD Risk Coronary Heart Disease Risk Table                     Men   Women  1/2 Average Risk   3.4   3.3  Average Risk       5.0   4.4  2 X Average Risk   9.6   7.1  3 X Average Risk  23.4   11.0        Use the calculated Patient Ratio above and the CHD Risk Table to determine the patient's CHD Risk.  ATP III CLASSIFICATION (LDL):  <100     mg/dL   Optimal  161-096  mg/dL   Near or Above                    Optimal  130-159  mg/dL   Borderline  045-409  mg/dL   High  >811     mg/dL   Very High 04/26/7828   LDLCALC  Value: 110        Total Cholesterol/HDL:CHD Risk Coronary Heart Disease Risk Table                     Men   Women  1/2 Average Risk   3.4   3.3  Average Risk       5.0   4.4  2 X Average Risk   9.6   7.1  3 X Average Risk  23.4   11.0        Use the calculated Patient Ratio above and the CHD Risk Table to determine the patient's CHD Risk.        ATP III CLASSIFICATION (LDL):  <100     mg/dL   Optimal  562-130  mg/dL   Near or Above                    Optimal  130-159  mg/dL   Borderline  865-784  mg/dL   High  >696     mg/dL   Very  High* 2/95/2841   Lab Results  Component Value Date   TRIG 61 03/03/2010   TRIG 79 09/23/2009   TRIG 121 08/31/2009   Lab Results  Component Value Date   CHOLHDL 3.0 03/03/2010   CHOLHDL 6.2 09/23/2009   CHOLHDL 4.8 08/31/2009   No results found for this basename: LDLDIRECT      Radiology:  Chest x-ray shows pulmonary edema  EKG: Atrial fibrillation with rapid ventricular response  ASSESSMENT: Paroxysmal atrial fibrillation, coronary artery disease, diastolic dysfunction  PLAN:  Continue rate control with IV diltiazem.  She is already supra- therapeutic on her Coumadin.  Hold Coumadin for now.  Hopefully, she will convert on her own to normal sinus rhythm.  Since she is symptomatic with her atrial fibrillation, I would consider adding amiodarone for rhythm control.  This was discussed with the family. I will leave this decision to Dr. Antoine Poche.    Pulmonary edema on chest x-ray.  Likely due to diastolic dysfunction from loss of her atrial kick.  Unclear what her ejection fraction is.  She may have had an echocardiogram done at her cardiologist's office.  We'll not order echo at this time.  Will give 2 doses of IV Lasix over the next 2 days.  After that, hopefully she can switch back to by mouth Lasix.  We'll rule out for MI with enzymes.  I don't think this event is related to coronary ischemia.    Corky Crafts., MD  09/30/2011  12:23 PM

## 2011-10-23 NOTE — Discharge Instructions (Signed)
Electrical Cardioversion Cardioversion is the delivery of a jolt of electricity to change the rhythm of the heart. Sticky patches or metal paddles are placed on the chest to deliver the electricity from a special device. This is done to restore a normal rhythm. A rhythm that is too fast or not regular keeps the heart from pumping well. Compared to medicines used to change an abnormal rhythm, cardioversion is faster and works better. It is also unpleasant and may dislodge blood clots from the heart. WHEN WOULD THIS BE DONE?  In an emergency:   There is low or no blood pressure as a result of the heart rhythm.   Normal rhythm must be restored as fast as possible to protect the brain and heart from further damage.   It may save a life.   For less serious heart rhythms, such as atrial fibrillation or flutter, in which:   The heart is beating too fast or is not regular.   The heart is still able to pump enough blood, but not as well as it should.   Medicine to change the rhythm has not worked.   It is safe to wait in order to allow time for preparation.  LET YOUR CAREGIVER KNOW ABOUT:   Every medicine you are taking. It is very important to do this! Know when to take or stop taking any of them.   Any time in the past that you have felt your heart was not beating normally.  RISKS AND COMPLICATIONS   Clots may form in the chambers of the heart if it is beating too fast. These clots may be dislodged during the procedure and travel to other parts of the body.   There is risk of a stroke during and after the procedure if a clot moves. Blood thinners lower this risk.   You may have a special test of your heart (TEE) to make sure there are no clots in your heart.  BEFORE THE PROCEDURE   You may have some tests to see how well your heart is working.   You may start taking blood thinners so your blood does not clot as easily.   Other drugs may be given to help your heart work better.    PROCEDURE (SCHEDULED)  The procedure is typically done in a hospital by a heart doctor (cardiologist).   You will be told when and where to go.   You may be given some medicine through an intravenous (IV) access to reduce discomfort and make you sleepy before the procedure.   Your whole body may move when the shock is delivered. Your chest may feel sore.   You may be able to go home after a few hours. Your heart rhythm will be watched to make sure it does not change.  HOME CARE INSTRUCTIONS   Only take medicine as directed by your caregiver. Be sure you understand how and when to take your medicine.   Learn how to feel your pulse and check it often.   Limit your activity for 48 hours.   Avoid caffeine and other stimulants as directed.  SEEK MEDICAL CARE IF:   You feel like your heart is beating too fast or your pulse is not regular.   You have any questions about your medicines.   You have bleeding that will not stop.  SEEK IMMEDIATE MEDICAL CARE IF:   You are dizzy or feel faint.   It is hard to breathe or you feel short of breath.     There is a change in discomfort in your chest.   Your speech is slurred or you have trouble moving your arm or leg on one side.   You get a muscle cramp.   Your fingers or toes turn cold or blue.  MAKE SURE YOU:   Understand these instructions.   Will watch your condition.   Will get help right away if you are not doing well or get worse.  Document Released: 07/20/2002 Document Revised: 07/19/2011 Document Reviewed: 11/19/2007 [facility] Patient Information 2012 [place], [place].Transesophageal Echocardiography A transesophageal echocardiogram (TEE) is a special type of test that produces images of the heart by sound waves (echocardiogram). This type of echocardiogram can obtain better images of the heart than a standard echocardiogram. A TEE is done by passing a flexible tube down the esophagus. The heart is located in front of the  esophagus. Because the heart and esophagus are close to one another, your caregiver can take very clear, detailed pictures of the heart via ultrasound waves. WHY HAVE A TEE? Your caregiver may need more information based on your medical condition. A TEE is usually performed due to the following:  Your caregiver needs more information based on standard echocardiogram findings.   If you had a stroke, this might have happened because a clot formed in your heart. A TEE can visualize different areas of the heart and check for clots.   To check valve anatomy and function. Your caregiver will especially look at the mitral valve.   To check for redness, soreness, and swelling (inflammation) on the inside lining of the heart (endocarditis).   To evaluate the dividing wall (septum) of the heart and presence of a hole that did not close after birth (patent foramen ovale, PFO).   To help diagnose a tear in the wall of the aorta (aortic dissection).   During cardiac valve surgery, a TEE probe is placed. This allows the surgeon to assess the valve repair before closing the chest.  LET YOUR CAREGIVER KNOW ABOUT:   Swallowing difficulties.   An esophageal obstruction.   Use of aspirin or antiplatelet therapy.  RISKS AND COMPLICATIONS  Though extremely rare, an esophageal tear (rupture) is a potential complication. BEFORE THE PROCEDURE   Arrive at least 1 hour before the procedure or as told by your caregiver.   Do not eat or drink for 6 hours before the procedure or as told by your caregiver.   An intravenous (IV) access tube will be started in the arm.  PROCEDURE   A medicine to help you relax (sedative) will be given through the IV.   A medicine that numbs the area (local anesthetic) may be sprayed to the back of the throat.   Your blood pressure, heart rate, and breathing (vital signs) will be monitored during the procedure.   The TEE probe is a long, flexible tube. It is about the width  of an adult female's index finger. The tip of the probe is placed into the back of the mouth and you will be asked to swallow. This helps to pass the tip of the probe into the esophagus. Once the tip of the probe is in the correct area, your caregiver can take pictures of the heart.   A TEE is usually not a painful procedure. You may feel the probe press against the back of the throat. The probe does not enter the trachea and does not affect your breathing.   Your time spent at the hospital is usually  less than 2 hours.  AFTER THE PROCEDURE   You will be in bed, resting until you have fully returned to consciousness.   When you first awaken, your throat may feel slightly sore and will probably still feel numb. This will improve slowly over time.   You will not be allowed to eat or drink until it is clear that numbness has improved.   Once you have been able to drink, urinate, and sit on the edge of the bed without feeling sick to your stomach (nauseous) or dizzy, you may be cleared to dress and go home.   Do not drive yourself home. You have had medications that can continue to make you feel drowsy and can impair your reflexes.   You should have a friend or family member with you for the next 24 hours after your examination.  Obtaining the test results It is your responsibility to obtain your test results. Ask the lab or department performing the test when and how you will get your results. SEEK IMMEDIATE MEDICAL CARE IF:   There is chest pain.   You have a hard time breathing or have shortness of breath.   You cough or throw up (vomit) blood.  MAKE SURE YOU:   Understand these instructions.   Will watch this condition.   Will get help right away if you is not doing well or gets worse.  Document Released: 10/20/2002 Document Revised: 07/19/2011 Document Reviewed: 01/11/2009 [facility] Patient Information 2012 [place], [place].

## 2011-10-23 NOTE — Op Note (Signed)
Transesophageal Echocardiogram: Indication: TEE/DCC Sedation: Versed: 3, Fentanyl: 25, Other: 0 ASA: 3, Airway: 2  Procedure:  The patient was moderately sedated with the above doses of versed and fentanyl.  Using digital technique an omniplane probe was advanced into the distal esophagus without incident. Transgastric imaging revealed decreased LV function with EF 35-40% and no mural apical thrombus.  There was mild RV enlargement.  Moderate biatrial enlargement.    The pulmonary and tricuspid valves were structurally normal.  There was mild TR Pacing wires in RA and RV The mitral valve was thickened  with moderate  mitral regurgitation.  Annular dilatation The aortic valve was trileaflet with mild AR The aortic root was normal.    Imaging of the septum showed no ASD or VSD 2D and color flow confirmed no PFO  The LAE was well visualized in orthogonal views.  There was no spontaneous contrast and no thrombus.    The descending thoracic aorta had no mural aortic debris with no evidence of aneurysmal dilation or disection  Impression:  1)  LVE EF 35-40% 2) Moderate MR 3) RV enlargement with mild TR and pacing wires in RA/RV 4) Mild AR mild AS planimetry AVA 1.6 cm2 5) Moderate biatrial enlargement 6) No LAA thrombus 7) No ASD 8) No effusion  DCC:  The patient was shocked with a single biphasic 150J shock.  Afib at rate of 115 converted to NSR rate 60  Medtronic Rep present.  Set LRL to AV pace at 70 bpm  Impression: Succesful TEE/DCC.  Medtronic pacer ok and set to AV pace at lower rate of 70 to help maintain NSR  Charlton Haws 10/23/2011 2:12 PM

## 2011-10-24 ENCOUNTER — Encounter (HOSPITAL_COMMUNITY): Payer: Self-pay | Admitting: Cardiovascular Disease

## 2011-10-29 ENCOUNTER — Ambulatory Visit: Payer: PRIVATE HEALTH INSURANCE | Admitting: Physician Assistant

## 2011-10-29 ENCOUNTER — Other Ambulatory Visit: Payer: PRIVATE HEALTH INSURANCE

## 2011-11-02 ENCOUNTER — Telehealth: Payer: Self-pay | Admitting: Cardiology

## 2011-11-02 MED ORDER — SPIRONOLACTONE 12.5 MG HALF TABLET: 12.5000 mg | ORAL_TABLET | Freq: Every day | ORAL | Status: DC

## 2011-11-02 MED ORDER — ROSUVASTATIN CALCIUM 5 MG PO TABS: 5.0000 mg | ORAL_TABLET | Freq: Every day | ORAL | Status: DC

## 2011-11-02 MED ORDER — DILTIAZEM HCL ER COATED BEADS 120 MG PO CP24: 120.0000 mg | ORAL_CAPSULE | Freq: Every day | ORAL | Status: DC

## 2011-11-02 MED ORDER — AMIODARONE HCL 200 MG PO TABS: 200.0000 mg | ORAL_TABLET | Freq: Every day | ORAL | Status: DC

## 2011-11-02 NOTE — Telephone Encounter (Signed)
Amiodarone, spirolactone crestor, diltiazem, [facility] 90 days supply with 3 refills

## 2011-11-07 ENCOUNTER — Other Ambulatory Visit: Payer: Self-pay | Admitting: *Deleted

## 2011-11-07 MED ORDER — SPIRONOLACTONE 25 MG PO TABS: 12.5000 mg | ORAL_TABLET | Freq: Every day | ORAL | Status: DC

## 2011-12-05 ENCOUNTER — Ambulatory Visit (INDEPENDENT_AMBULATORY_CARE_PROVIDER_SITE_OTHER): Payer: MEDICARE | Admitting: Cardiology

## 2011-12-05 ENCOUNTER — Encounter: Payer: Self-pay | Admitting: Cardiology

## 2011-12-05 VITALS — BP 110/70 | HR 46 | Ht 63.0 in | Wt 111.0 lb

## 2011-12-05 DIAGNOSIS — I2510 Atherosclerotic heart disease of native coronary artery without angina pectoris: Secondary | ICD-10-CM

## 2011-12-05 DIAGNOSIS — I4891 Unspecified atrial fibrillation: Secondary | ICD-10-CM

## 2011-12-05 DIAGNOSIS — N259 Disorder resulting from impaired renal tubular function, unspecified: Secondary | ICD-10-CM

## 2011-12-05 DIAGNOSIS — I10 Essential (primary) hypertension: Secondary | ICD-10-CM

## 2011-12-05 NOTE — Assessment & Plan Note (Signed)
The blood pressure is at target. No change in medications is indicated. We will continue with therapeutic lifestyle changes (TLC).  

## 2011-12-05 NOTE — Assessment & Plan Note (Signed)
She seems to be maintaining sinus rhythm. She will continue the medications as listed I will make sure she has appropriate amiodarone followup.

## 2011-12-05 NOTE — Progress Notes (Signed)
HPI The patient presents for followup after recent cardioversion. She seems to be doing relatively well since we last saw her. She still in regular rhythm. She claims to be feeling well. She's staying with one of her children. She is spending a lot of time in bed. The patient denies any new symptoms such as chest discomfort, neck or arm discomfort. There has been no new shortness of breath, PND or orthopnea. There have been no reported palpitations, presyncope or syncope.  Allergies  Allergen Reactions  . Altace Hives  . Contrast Media (Iodinated Diagnostic Agents) Other (See Comments)    Reaction noted by md  . Heparin Other (See Comments)    Clots blood instead of thinning  . Hydromorphone Hcl Other (See Comments)    Crazy feeling  . Ramipril Hives    Current Outpatient Prescriptions  Medication Sig Dispense Refill  . ALPRAZolam (XANAX) 0.25 MG tablet Take 0.25 mg by mouth at bedtime as needed. For sleep      . amiodarone (PACERONE) 200 MG tablet Take 1 tablet (200 mg total) by mouth daily.  90 tablet  2  . bisacodyl (DULCOLAX) 5 MG EC tablet Take 5 mg by mouth daily as needed. For constipation      . carvedilol (COREG) 25 MG tablet Take 1 tablet (25 mg total) by mouth 2 (two) times daily with a meal.  60 tablet  3  . Cyanocobalamin (VITAMIN B-12 IJ) Inject 1,000 mcg as directed every 30 (thirty) days.      Marland Kitchen diltiazem (CARDIZEM CD) 120 MG 24 hr capsule Take 1 capsule (120 mg total) by mouth daily.  90 capsule  2  . furosemide (LASIX) 40 MG tablet Take 1 tablet (40 mg total) by mouth daily.  30 tablet  3  . HYDROcodone-acetaminophen (NORCO) 7.5-325 MG per tablet Take 1 tablet by mouth every 4 (four) hours as needed. For pain      . nitroGLYCERIN (NITROSTAT) 0.4 MG SL tablet Place 0.4 mg under the tongue every 5 (five) minutes as needed. For chest pain      . omeprazole (PRILOSEC) 20 MG capsule Take 20 mg by mouth daily.       . potassium chloride SA (K-DUR,KLOR-CON) 20 MEQ tablet  Take 1 tablet (20 mEq total) by mouth daily.  30 tablet  3  . rosuvastatin (CRESTOR) 5 MG tablet Take 1 tablet (5 mg total) by mouth daily at 6 PM.  90 tablet  2  . spironolactone (ALDACTONE) 25 MG tablet Take 0.5 tablets (12.5 mg total) by mouth daily.  45 tablet  3  . Vitamin D, Ergocalciferol, (DRISDOL) 50000 UNITS CAPS Take 50,000 Units by mouth every 7 (seven) days. On Fridays      . warfarin (COUMADIN) 5 MG tablet Take 0.5 tablets (2.5 mg total) by mouth See admin instructions. Please take 2.5mg  (one half tablet) daily until INR checked at Coumadin clinic.  30 tablet  1    Past Medical History  Diagnosis Date  . Coronary artery disease     status post LAD stenting in 1999 after NSTEMI; CABG 2004; Last LHC 7/11: Proximal LAD 80%, mid 95%, distal 40%, mid R. High 40%, proximal RCA 40%, LIMA-LAD patent, SVG-PDA patent, EF 65%.   . Hypertension   . Hyperlipidemia   . Carotid artery disease     status post right carotid endarterectomy.  . Degenerative joint disease   . Restless leg syndrome   . Mild renal insufficiency   . Heparin  induced thrombocytopenia   . Diphtheria     "as a child"  . Pleural effusion, bilateral 06/2003  . Pseudoaneurysm     status post repair following catherization in 1999  . Chronic systolic heart failure 10/02/11  . Ischemic cardiomyopathy     Echocardiogram 10/01/11: Mild LVH, EF 35-40%, basal inferior, basal to mid posterior and basal to mid anterolateral severe hypokinesis, mild to moderate AI, moderate MR (ischemic MR), moderate LAE, mild RVE, mildly reduced RV systolic function, mild RAE, PASP 43-44  . COPD (chronic obstructive pulmonary disease)   . Atrial fibrillation/flutter     s/p multiple DCCVs;  amiodarone started 09/2011, TEE DCCV 3/13  . Tachycardia-bradycardia syndrome     s/p Medtronic pacemaker implant 09/2011 (8 sec pause noted)  . GERD (gastroesophageal reflux disease)   . Renal artery stenosis     bilateral- status post stenting  .  Non-functioning kidney   . Depression     "cries alot"    Past Surgical History  Procedure Date  . Carotid endarterectomy 05/2003    right  . Insert / replace / remove pacemaker 10/02/11    initial placement  . Cholecystectomy ?02/2011  . Tonsillectomy and adenoidectomy   . Renal artery stent 06/2003    bilaterally/e-chart  . Thoracentesis ? 2000; 05/2011  . Coronary angioplasty with stent placement 1999  . Av fistula repair 01/1998    w/pseudoaneurysm repair S/P catheterization/E-chart  . Coronary artery bypass graft 05/2003    CABG X 3  . Tee without cardioversion 10/23/2011    Procedure: TRANSESOPHAGEAL ECHOCARDIOGRAM (TEE);  Surgeon: Wendall Stade, MD;  Location: [facility] ENDOSCOPY;  Service: Cardiovascular;  Laterality: N/A;  . Cardioversion 10/23/2011    Procedure: CARDIOVERSION;  Surgeon: Wendall Stade, MD;  Location: [facility] ENDOSCOPY;  Service: Cardiovascular;  Laterality: N/A;    ROS:  As stated in the HPI and negative for all other systems.  PHYSICAL EXAM BP 110/70  Pulse 46  Ht 5\' 3"  (1.6 m)  Wt 111 lb (50.349 kg)  BMI 19.66 kg/m2 GENERAL:  Frail appearing HEENT:  Pupils equal round and reactive, fundi not visualized, oral mucosa unremarkable NECK:  No jugular venous distention, waveform within normal limits, carotid upstroke brisk and symmetric, no bruits, no thyromegaly LYMPHATICS:  No cervical, inguinal adenopathy LUNGS:  Clear to auscultation bilaterally BACK:  No CVA tenderness CHEST:  Healed pacer pocket HEART:  PMI not displaced or sustained,S1 and S2 within normal limits, no S3, no S4, no clicks, no rubs, no murmurs ABD:  Flat, positive bowel sounds normal in frequency in pitch, no bruits, no rebound, no guarding, no midline pulsatile mass, no hepatomegaly, no splenomegaly EXT:  2 plus pulses upper with decreased DP/PT bilaterally, no edema, no cyanosis no clubbing   ASSESSMENT AND PLAN

## 2011-12-05 NOTE — Assessment & Plan Note (Signed)
She said she had labs recently by Josue Hector, MD and that the labs were OK.  I will defer to his follow up.

## 2011-12-05 NOTE — Patient Instructions (Signed)
Continue current medications as listed.  Follow up in 6 months with Dr Antoine Poche in [place]

## 2011-12-05 NOTE — Assessment & Plan Note (Signed)
The patient has no new sypmtoms.  No further cardiovascular testing is indicated.  We will continue with aggressive risk reduction and meds as listed.  

## 2012-01-17 ENCOUNTER — Ambulatory Visit (INDEPENDENT_AMBULATORY_CARE_PROVIDER_SITE_OTHER): Payer: Medicare Other | Admitting: Internal Medicine

## 2012-01-17 ENCOUNTER — Encounter: Payer: Self-pay | Admitting: Internal Medicine

## 2012-01-17 VITALS — BP 118/70 | HR 80 | Ht 64.0 in | Wt 114.1 lb

## 2012-01-17 DIAGNOSIS — I495 Sick sinus syndrome: Secondary | ICD-10-CM

## 2012-01-17 DIAGNOSIS — I4891 Unspecified atrial fibrillation: Secondary | ICD-10-CM

## 2012-01-17 LAB — PACEMAKER DEVICE OBSERVATION
ATRIAL PACING PM: 99
RV LEAD IMPEDENCE PM: 618 Ohm
VENTRICULAR PACING PM: 99

## 2012-01-17 MED ORDER — CARVEDILOL 12.5 MG PO TABS: 12.5000 mg | ORAL_TABLET | Freq: Two times a day (BID) | ORAL | Status: AC

## 2012-01-17 NOTE — Assessment & Plan Note (Signed)
Normal pacemaker function See Arita Miss Art report A and V pacing outputs are adjusted today

## 2012-01-17 NOTE — Assessment & Plan Note (Signed)
Maintaining sinus rhythm Goal INR 2-3 Continue amiodarone Decrease coreg to 12.5mg  BID due to fatigue.

## 2012-01-17 NOTE — Patient Instructions (Signed)
Your physician wants you to follow-up in: February 2014 with Dr. Johney Frame.  You will receive a reminder letter in the mail two months in advance. If you don't receive a letter, please call our office to schedule the follow-up appointment.   Decrease Coreg dose to 12.5mg  twice daily.

## 2012-01-17 NOTE — Progress Notes (Signed)
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 female who presents today for routine electrophysiology followup.  Since having her pacemaker implanted, the patient reports doing reasonably well.  She remains quite weak and frail.  She was cardioverted in March and appears to be better in sinus rhythm.  Today, she denies symptoms of palpitations, chest pain, shortness of breath,  dizziness, presyncope, or syncope.  The patient is otherwise without complaint today.   Past Medical History  Diagnosis Date  . Coronary artery disease     status post LAD stenting in 1999 after NSTEMI; CABG 2004; Last LHC 7/11: Proximal LAD 80%, mid 95%, distal 40%, mid R. High 40%, proximal RCA 40%, LIMA-LAD patent, SVG-PDA patent, EF 65%.   . Hypertension   . Hyperlipidemia   . Carotid artery disease     status post right carotid endarterectomy.  . Degenerative joint disease   . Restless leg syndrome   . Mild renal insufficiency   . Heparin induced thrombocytopenia   . Diphtheria     "as a child"  . Pleural effusion, bilateral 06/2003  . Pseudoaneurysm     status post repair following catherization in 1999  . Chronic systolic heart failure 10/02/11  . Ischemic cardiomyopathy     Echocardiogram 10/01/11: Mild LVH, EF 35-40%, basal inferior, basal to mid posterior and basal to mid anterolateral severe hypokinesis, mild to moderate AI, moderate MR (ischemic MR), moderate LAE, mild RVE, mildly reduced RV systolic function, mild RAE, PASP 43-44  . COPD (chronic obstructive pulmonary disease)   . Atrial fibrillation/flutter     s/p multiple DCCVs;  amiodarone started 09/2011, TEE DCCV 3/13  . Tachycardia-bradycardia syndrome     s/p Medtronic pacemaker implant 09/2011 (8 sec pause noted)  . GERD (gastroesophageal reflux disease)   . Renal artery stenosis     bilateral- status post stenting  . Non-functioning kidney   . Depression     "cries alot"   Past  Surgical History  Procedure Date  . Carotid endarterectomy 05/2003    right  . Pacemaker insertion 10/02/11    implanted by Dr [name] [name]  . Cholecystectomy ?02/2011  . Tonsillectomy and adenoidectomy   . Renal artery stent 06/2003    bilaterally/e-chart  . Thoracentesis ? 2000; 05/2011  . Coronary angioplasty with stent placement 1999  . Av fistula repair 01/1998    w/pseudoaneurysm repair S/P catheterization/E-chart  . Coronary artery bypass graft 05/2003    CABG X 3  . Tee without cardioversion 10/23/2011    Procedure: TRANSESOPHAGEAL ECHOCARDIOGRAM (TEE);  Surgeon: [name] Stade, MD;  Location: [facility] ENDOSCOPY;  Service: Cardiovascular;  Laterality: N/A;  . Cardioversion 10/23/2011    Procedure: CARDIOVERSION;  Surgeon: [name] Stade, MD;  Location: [facility] ENDOSCOPY;  Service: Cardiovascular;  Laterality: N/A;    Current Outpatient Prescriptions  Medication Sig Dispense Refill  . ALPRAZolam (XANAX) 0.25 MG tablet Take 0.25 mg by mouth at bedtime as needed. For sleep      . amiodarone (PACERONE) 200 MG tablet Take 1 tablet (200 mg total) by mouth daily.  90 tablet  2  . bisacodyl (DULCOLAX) 5 MG EC tablet Take 5 mg by mouth daily as needed. For constipation      . carvedilol (COREG) 25 MG tablet Take 1 tablet (25 mg total) by mouth 2 (two) times daily with a meal.  60 tablet  3  . Cyanocobalamin (VITAMIN B-12 IJ) Inject 1,000 mcg as  directed. Every two weeks.      Marland Kitchen diltiazem (CARDIZEM CD) 120 MG 24 hr capsule Take 1 capsule (120 mg total) by mouth daily.  90 capsule  2  . furosemide (LASIX) 40 MG tablet Take 1 tablet (40 mg total) by mouth daily.  30 tablet  3  . HYDROcodone-acetaminophen (NORCO) 7.5-325 MG per tablet Take 1 tablet by mouth every 4 (four) hours as needed. For pain      . nitroGLYCERIN (NITROSTAT) 0.4 MG SL tablet Place 0.4 mg under the tongue every 5 (five) minutes as needed. For chest pain      . omeprazole (PRILOSEC) 20 MG capsule Take 20 mg by mouth daily.       .  potassium chloride SA (K-DUR,KLOR-CON) 20 MEQ tablet Take 1 tablet (20 mEq total) by mouth daily.  30 tablet  3  . rosuvastatin (CRESTOR) 5 MG tablet Take 1 tablet (5 mg total) by mouth daily at 6 PM.  90 tablet  2  . spironolactone (ALDACTONE) 25 MG tablet Take 0.5 tablets (12.5 mg total) by mouth daily.  45 tablet  3  . Vitamin D, Ergocalciferol, (DRISDOL) 50000 UNITS CAPS Take 50,000 Units by mouth every 7 (seven) days. On Fridays      . warfarin (COUMADIN) 5 MG tablet Take 0.5 tablets (2.5 mg total) by mouth See admin instructions. Please take 2.5mg  (one half tablet) daily until INR checked at Coumadin clinic.  30 tablet  1    Physical Exam: Filed Vitals:   01/17/12 1024  BP: 118/70  Pulse: 80  Height: 5\' 4"  (1.626 m)  Weight: 114 lb 1.9 oz (51.764 kg)    GEN- The patient is chronically ill and frail appearing, alert and oriented x 3 today.   Head- normocephalic, atraumatic Eyes-  Sclera clear, conjunctiva pink Ears- hearing intact Oropharynx- clear Lungs- Clear to ausculation bilaterally, normal work of breathing Chest- pacemaker pocket is well healed Heart- Regular rate and rhythm  GI- soft, NT, ND, + BS Extremities- no clubbing, cyanosis, or edema  Pacemaker interrogation- reviewed in detail today,  See PACEART report  Assessment and Plan:

## 2012-02-12 ENCOUNTER — Telehealth: Payer: Self-pay | Admitting: Cardiology

## 2012-02-12 NOTE — Telephone Encounter (Signed)
Dr nyland would like to know dr hochrein's thoughts on changing pt from coumadin to elaquis or xarelto due to her fluctuating inr checks. pls call

## 2012-02-12 NOTE — Telephone Encounter (Signed)
Will forward to Dr Hochrein for recommendations. 

## 2012-02-13 NOTE — Telephone Encounter (Signed)
Will fax information to Dr Lysbeth Galas for reference.

## 2012-02-13 NOTE — Telephone Encounter (Signed)
Xarelto is contraindicated with her creat clearance (27).  Although she would qualify for apixiban she is borderline  Serum creatinine ?1.5 mg/dL andeither age ?80 years or body weight ?60 kg: 2.5 mg twice daily Clcr <15 mL/minute: No dosage adjustment provided in manufacturer's labeling (has not been studied). Note: When used to prevent stroke with nonvalvular atrial fibrillation and Clcr <25 mL/minute, the American Heart Association/American Stroke Association recommends to avoid use Jacolyn Reedy, 2012).  I would not suggest switching in this situation.

## 2012-02-18 ENCOUNTER — Emergency Department (HOSPITAL_COMMUNITY): Payer: Medicare Other

## 2012-02-18 ENCOUNTER — Inpatient Hospital Stay (HOSPITAL_COMMUNITY)
Admission: EM | Admit: 2012-02-18 | Discharge: 2012-02-21 | DRG: 683 | Disposition: A | Discharge status: Skilled Nursing Facility | Payer: Medicare Other | Attending: Internal Medicine | Admitting: Internal Medicine

## 2012-02-18 ENCOUNTER — Encounter (HOSPITAL_COMMUNITY): Payer: Self-pay | Admitting: *Deleted

## 2012-02-18 ENCOUNTER — Observation Stay (HOSPITAL_COMMUNITY): Payer: Medicare Other

## 2012-02-18 DIAGNOSIS — S42302A Unspecified fracture of shaft of humerus, left arm, initial encounter for closed fracture: Secondary | ICD-10-CM | POA: Diagnosis present

## 2012-02-18 DIAGNOSIS — I10 Essential (primary) hypertension: Secondary | ICD-10-CM | POA: Diagnosis present

## 2012-02-18 DIAGNOSIS — S42309A Unspecified fracture of shaft of humerus, unspecified arm, initial encounter for closed fracture: Secondary | ICD-10-CM | POA: Diagnosis present

## 2012-02-18 DIAGNOSIS — I4891 Unspecified atrial fibrillation: Secondary | ICD-10-CM | POA: Diagnosis present

## 2012-02-18 DIAGNOSIS — Z79899 Other long term (current) drug therapy: Secondary | ICD-10-CM

## 2012-02-18 DIAGNOSIS — F329 Major depressive disorder, single episode, unspecified: Secondary | ICD-10-CM | POA: Diagnosis present

## 2012-02-18 DIAGNOSIS — W19XXXA Unspecified fall, initial encounter: Secondary | ICD-10-CM | POA: Diagnosis present

## 2012-02-18 DIAGNOSIS — I059 Rheumatic mitral valve disease, unspecified: Secondary | ICD-10-CM | POA: Diagnosis present

## 2012-02-18 DIAGNOSIS — J449 Chronic obstructive pulmonary disease, unspecified: Secondary | ICD-10-CM | POA: Diagnosis present

## 2012-02-18 DIAGNOSIS — E871 Hypo-osmolality and hyponatremia: Secondary | ICD-10-CM | POA: Diagnosis present

## 2012-02-18 DIAGNOSIS — E860 Dehydration: Secondary | ICD-10-CM

## 2012-02-18 DIAGNOSIS — N289 Disorder of kidney and ureter, unspecified: Secondary | ICD-10-CM | POA: Diagnosis present

## 2012-02-18 DIAGNOSIS — F3289 Other specified depressive episodes: Secondary | ICD-10-CM | POA: Diagnosis present

## 2012-02-18 DIAGNOSIS — R55 Syncope and collapse: Secondary | ICD-10-CM

## 2012-02-18 DIAGNOSIS — I2510 Atherosclerotic heart disease of native coronary artery without angina pectoris: Secondary | ICD-10-CM | POA: Diagnosis present

## 2012-02-18 DIAGNOSIS — S42209A Unspecified fracture of upper end of unspecified humerus, initial encounter for closed fracture: Secondary | ICD-10-CM

## 2012-02-18 DIAGNOSIS — IMO0002 Reserved for concepts with insufficient information to code with codable children: Secondary | ICD-10-CM

## 2012-02-18 DIAGNOSIS — N898 Other specified noninflammatory disorders of vagina: Secondary | ICD-10-CM | POA: Diagnosis present

## 2012-02-18 DIAGNOSIS — I5022 Chronic systolic (congestive) heart failure: Secondary | ICD-10-CM | POA: Diagnosis present

## 2012-02-18 DIAGNOSIS — N939 Abnormal uterine and vaginal bleeding, unspecified: Secondary | ICD-10-CM | POA: Diagnosis present

## 2012-02-18 DIAGNOSIS — J4489 Other specified chronic obstructive pulmonary disease: Secondary | ICD-10-CM | POA: Diagnosis present

## 2012-02-18 DIAGNOSIS — G2581 Restless legs syndrome: Secondary | ICD-10-CM | POA: Diagnosis present

## 2012-02-18 DIAGNOSIS — E785 Hyperlipidemia, unspecified: Secondary | ICD-10-CM | POA: Diagnosis present

## 2012-02-18 DIAGNOSIS — Z9861 Coronary angioplasty status: Secondary | ICD-10-CM

## 2012-02-18 DIAGNOSIS — I779 Disorder of arteries and arterioles, unspecified: Secondary | ICD-10-CM | POA: Diagnosis present

## 2012-02-18 DIAGNOSIS — I080 Rheumatic disorders of both mitral and aortic valves: Secondary | ICD-10-CM | POA: Diagnosis present

## 2012-02-18 DIAGNOSIS — Z950 Presence of cardiac pacemaker: Secondary | ICD-10-CM

## 2012-02-18 DIAGNOSIS — K219 Gastro-esophageal reflux disease without esophagitis: Secondary | ICD-10-CM | POA: Diagnosis present

## 2012-02-18 DIAGNOSIS — I509 Heart failure, unspecified: Secondary | ICD-10-CM | POA: Diagnosis present

## 2012-02-18 DIAGNOSIS — Z951 Presence of aortocoronary bypass graft: Secondary | ICD-10-CM

## 2012-02-18 DIAGNOSIS — R0602 Shortness of breath: Secondary | ICD-10-CM

## 2012-02-18 DIAGNOSIS — Y92009 Unspecified place in unspecified non-institutional (private) residence as the place of occurrence of the external cause: Secondary | ICD-10-CM

## 2012-02-18 DIAGNOSIS — M109 Gout, unspecified: Secondary | ICD-10-CM | POA: Diagnosis present

## 2012-02-18 DIAGNOSIS — Z9181 History of falling: Secondary | ICD-10-CM

## 2012-02-18 DIAGNOSIS — M1990 Unspecified osteoarthritis, unspecified site: Secondary | ICD-10-CM | POA: Diagnosis present

## 2012-02-18 DIAGNOSIS — Z66 Do not resuscitate: Secondary | ICD-10-CM | POA: Diagnosis present

## 2012-02-18 DIAGNOSIS — Z7901 Long term (current) use of anticoagulants: Secondary | ICD-10-CM

## 2012-02-18 DIAGNOSIS — N179 Acute kidney failure, unspecified: Principal | ICD-10-CM | POA: Diagnosis present

## 2012-02-18 HISTORY — DX: Gout, unspecified: M10.9

## 2012-02-18 LAB — URINALYSIS, ROUTINE W REFLEX MICROSCOPIC
Bilirubin Urine: NEGATIVE
Hgb urine dipstick: NEGATIVE
Leukocytes, UA: NEGATIVE
Nitrite: NEGATIVE
Protein, ur: NEGATIVE mg/dL
Specific Gravity, Urine: 1.01 (ref 1.005–1.030)

## 2012-02-18 LAB — PROTIME-INR: Prothrombin Time: 20.3 seconds — ABNORMAL HIGH (ref 11.6–15.2)

## 2012-02-18 LAB — COMPREHENSIVE METABOLIC PANEL
ALT: 13 U/L (ref 0–35)
AST: 20 U/L (ref 0–37)
Alkaline Phosphatase: 74 U/L (ref 39–117)
CO2: 24 mEq/L (ref 19–32)
Calcium: 9.9 mg/dL (ref 8.4–10.5)
Chloride: 96 mEq/L (ref 96–112)
GFR calc non Af Amer: 21 mL/min — ABNORMAL LOW (ref >90)
Potassium: 4.7 mEq/L (ref 3.5–5.1)
Sodium: 132 mEq/L — ABNORMAL LOW (ref 135–145)
Total Bilirubin: 0.3 mg/dL (ref 0.3–1.2)

## 2012-02-18 LAB — CBC
Platelets: 298 10*3/uL (ref 150–400)
RBC: 4.09 MIL/uL (ref 3.87–5.11)
WBC: 12.2 10*3/uL — ABNORMAL HIGH (ref 4.0–10.5)

## 2012-02-18 MED ORDER — SODIUM CHLORIDE 0.9 % IV BOLUS (SEPSIS)
500.0000 mL | Freq: Once | INTRAVENOUS | Status: AC
  Administered 2012-02-18: 500 mL via INTRAVENOUS

## 2012-02-18 MED ORDER — POTASSIUM CHLORIDE CRYS ER 20 MEQ PO TBCR
20.0000 meq | EXTENDED_RELEASE_TABLET | Freq: Every evening | ORAL | Status: DC
  Administered 2012-02-18 – 2012-02-19 (×2): 20 meq via ORAL
  Filled 2012-02-18 (×2): qty 1

## 2012-02-18 MED ORDER — MORPHINE SULFATE 4 MG/ML IJ SOLN
4.0000 mg | Freq: Once | INTRAMUSCULAR | Status: AC
  Administered 2012-02-18: 4 mg via INTRAVENOUS
  Filled 2012-02-18: qty 1

## 2012-02-18 MED ORDER — ATORVASTATIN CALCIUM 10 MG PO TABS
10.0000 mg | ORAL_TABLET | Freq: Every day | ORAL | Status: DC
  Administered 2012-02-18 – 2012-02-20 (×3): 10 mg via ORAL
  Filled 2012-02-18 (×3): qty 1

## 2012-02-18 MED ORDER — SODIUM CHLORIDE 0.9 % IV SOLN
INTRAVENOUS | Status: AC
  Administered 2012-02-18: 23:00:00 via INTRAVENOUS

## 2012-02-18 MED ORDER — WARFARIN - PHARMACIST DOSING INPATIENT: Freq: Every day | Status: DC

## 2012-02-18 MED ORDER — MORPHINE SULFATE 2 MG/ML IJ SOLN
1.0000 mg | INTRAMUSCULAR | Status: DC | PRN
  Administered 2012-02-19 – 2012-02-21 (×7): 1 mg via INTRAVENOUS
  Filled 2012-02-18 (×7): qty 1

## 2012-02-18 MED ORDER — ALBUTEROL SULFATE (5 MG/ML) 0.5% IN NEBU: 2.5000 mg | INHALATION_SOLUTION | RESPIRATORY_TRACT | Status: DC | PRN

## 2012-02-18 MED ORDER — ONDANSETRON HCL 4 MG/2ML IJ SOLN
4.0000 mg | Freq: Once | INTRAMUSCULAR | Status: AC
  Administered 2012-02-18: 4 mg via INTRAVENOUS
  Filled 2012-02-18: qty 2

## 2012-02-18 MED ORDER — ALPRAZOLAM 0.25 MG PO TABS
0.2500 mg | ORAL_TABLET | Freq: Every evening | ORAL | Status: DC | PRN
  Administered 2012-02-20: 0.25 mg via ORAL
  Filled 2012-02-18: qty 1

## 2012-02-18 MED ORDER — PREDNISONE 20 MG PO TABS
20.0000 mg | ORAL_TABLET | Freq: Once | ORAL | Status: AC
  Administered 2012-02-19: 20 mg via ORAL
  Filled 2012-02-18: qty 1

## 2012-02-18 MED ORDER — CARVEDILOL 12.5 MG PO TABS
12.5000 mg | ORAL_TABLET | Freq: Two times a day (BID) | ORAL | Status: DC
  Administered 2012-02-19 – 2012-02-21 (×5): 12.5 mg via ORAL
  Filled 2012-02-18 (×5): qty 1

## 2012-02-18 MED ORDER — HYDROCODONE-ACETAMINOPHEN 5-325 MG PO TABS
1.0000 | ORAL_TABLET | ORAL | Status: DC | PRN
  Administered 2012-02-19 – 2012-02-21 (×7): 2 via ORAL
  Filled 2012-02-18 (×2): qty 2
  Filled 2012-02-18: qty 1
  Filled 2012-02-18 (×3): qty 2
  Filled 2012-02-18: qty 1
  Filled 2012-02-18: qty 2

## 2012-02-18 MED ORDER — FUROSEMIDE 40 MG PO TABS
40.0000 mg | ORAL_TABLET | Freq: Every day | ORAL | Status: DC
  Administered 2012-02-18 – 2012-02-19 (×2): 40 mg via ORAL
  Filled 2012-02-18 (×2): qty 1

## 2012-02-18 MED ORDER — SPIRONOLACTONE 25 MG PO TABS
12.5000 mg | ORAL_TABLET | Freq: Every day | ORAL | Status: DC
  Administered 2012-02-18: 12.5 mg via ORAL
  Filled 2012-02-18: qty 1

## 2012-02-18 MED ORDER — ONDANSETRON HCL 4 MG PO TABS: 4.0000 mg | ORAL_TABLET | Freq: Four times a day (QID) | ORAL | Status: DC | PRN

## 2012-02-18 MED ORDER — PREDNISONE 10 MG PO TABS
10.0000 mg | ORAL_TABLET | Freq: Once | ORAL | Status: AC
  Administered 2012-02-20: 10 mg via ORAL
  Filled 2012-02-18: qty 1

## 2012-02-18 MED ORDER — AMIODARONE HCL 200 MG PO TABS
200.0000 mg | ORAL_TABLET | Freq: Every day | ORAL | Status: DC
  Administered 2012-02-18 – 2012-02-19 (×2): 200 mg via ORAL
  Filled 2012-02-18 (×2): qty 1

## 2012-02-18 MED ORDER — ONDANSETRON HCL 4 MG/2ML IJ SOLN: 4.0000 mg | Freq: Three times a day (TID) | INTRAMUSCULAR | Status: DC | PRN

## 2012-02-18 MED ORDER — ACETAMINOPHEN 650 MG RE SUPP: 650.0000 mg | Freq: Four times a day (QID) | RECTAL | Status: DC | PRN

## 2012-02-18 MED ORDER — WARFARIN SODIUM 2.5 MG PO TABS
2.5000 mg | ORAL_TABLET | Freq: Once | ORAL | Status: AC
  Administered 2012-02-18: 2.5 mg via ORAL
  Filled 2012-02-18: qty 1

## 2012-02-18 MED ORDER — SODIUM CHLORIDE 0.9 % IV SOLN: INTRAVENOUS | Status: DC

## 2012-02-18 MED ORDER — ONDANSETRON HCL 4 MG/2ML IJ SOLN: 4.0000 mg | Freq: Four times a day (QID) | INTRAMUSCULAR | Status: DC | PRN

## 2012-02-18 MED ORDER — BISACODYL 5 MG PO TBEC
5.0000 mg | DELAYED_RELEASE_TABLET | Freq: Every day | ORAL | Status: DC | PRN
Start: 2012-02-18 — End: 2012-02-21
  Administered 2012-02-20 – 2012-02-21 (×2): 5 mg via ORAL
  Filled 2012-02-18 (×2): qty 1

## 2012-02-18 MED ORDER — ACETAMINOPHEN 325 MG PO TABS
650.0000 mg | ORAL_TABLET | Freq: Four times a day (QID) | ORAL | Status: DC | PRN
  Filled 2012-02-18: qty 2

## 2012-02-18 MED ORDER — PANTOPRAZOLE SODIUM 40 MG PO TBEC
40.0000 mg | DELAYED_RELEASE_TABLET | Freq: Every day | ORAL | Status: DC
  Administered 2012-02-19 – 2012-02-21 (×3): 40 mg via ORAL
  Filled 2012-02-18 (×3): qty 1

## 2012-02-18 NOTE — Progress Notes (Signed)
ANTICOAGU LATION CONSULT NOTE  Pharmacy Consult for Warfarin Indication: atrial fibrillation  Allergies  Allergen Reactions  . Ramipril Hives  . Contrast Media (Iodinated Diagnostic Agents) Other (See Comments)    Reaction noted by md  . Heparin Other (See Comments)    Clots blood instead of thinning  . Hydromorphone Hcl Other (See Comments)    Crazy feeling  . Ramipril Hives    Patient Measurements: Height: 5\' 4"  (162.6 cm) Weight: 110 lb (49.896 kg) IBW/kg (Calculated) : 54.7   Vital Signs: Temp: 98.2 F (36.8 C) (07/08 1403) Temp src: Oral (07/08 1403) BP: 165/66 mmHg (07/08 1747) Pulse Rate: 74  (07/08 1747)  Labs:  Basename 02/18/12 1556  HGB 11.7*  HCT 35.8*  PLT 298  APTT --  LABPROT 20.3*  INR 1.70*  HEPARINUNFRC --  CREATININE 2.02*  CKTOTAL --  CKMB --  TROPONINI --    Estimated Creatinine Clearance: 16.3 ml/min (by C-G formula based on Cr of 2.02).   Medical History: Past Medical History  Diagnosis Date  . Coronary artery disease     status post LAD stenting in 1999 after NSTEMI; CABG 2004; Last LHC 7/11: Proximal LAD 80%, mid 95%, distal 40%, mid R. High 40%, proximal RCA 40%, LIMA-LAD patent, SVG-PDA patent, EF 65%.   . Hypertension   . Hyperlipidemia   . Carotid artery disease     status post right carotid endarterectomy.  . Degenerative joint disease   . Restless leg syndrome   . Mild renal insufficiency   . Heparin induced thrombocytopenia   . Diphtheria     "as a child"  . Pleural effusion, bilateral 06/2003  . Pseudoaneurysm     status post repair following catherization in 1999  . Chronic systolic heart failure 10/02/11  . Ischemic cardiomyopathy     Echocardiogram 10/01/11: Mild LVH, EF 35-40%, basal inferior, basal to mid posterior and basal to mid anterolateral severe hypokinesis, mild to moderate AI, moderate MR (ischemic MR), moderate LAE, mild RVE, mildly reduced RV systolic function, mild RAE, PASP 43-44  . COPD (chronic  obstructive pulmonary disease)   . Atrial fibrillation/flutter     s/p multiple DCCVs;  amiodarone started 09/2011, TEE DCCV 3/13  . Tachycardia-bradycardia syndrome     s/p Medtronic pacemaker implant 09/2011 (8 sec pause noted)  . GERD (gastroesophageal reflux disease)   . Renal artery stenosis     bilateral- status post stenting  . Non-functioning kidney   . Depression     "cries alot"  . Gout     Medications:  Scheduled:    . sodium chloride   Intravenous STAT  .  morphine injection  4 mg Intravenous Once  .  morphine injection  4 mg Intravenous Once  . ondansetron (ZOFRAN) IV  4 mg Intravenous Once  . ondansetron (ZOFRAN) IV  4 mg Intravenous Once  . sodium chloride  500 mL Intravenous Once  . sodium chloride  500 mL Intravenous Once    Assessment: Okay for Protocol Recent fall, vaginal bleeding, left femur fracture. Continue Warfarin for now per IM (cardiology to help determine risk/benefit).  Hg okay and INR below goal. On Amiodarone which can elevate INR (home med).  Goal of Therapy:  INR 2-3   Plan:  Warfarin 2.5mg  PO x 1. Daily INR.  Mady Gemma 02/18/2012,8:37 PM

## 2012-02-18 NOTE — ED Provider Notes (Addendum)
History     CSN: [id number]  Arrival date & time 02/18/12  1401   First MD Initiated Contact with Patient 02/18/12 1411      Chief Complaint  Patient presents with  . Fall    (Consider location/radiation/quality/duration/timing/severity/associated sxs/prior treatment) Patient is a 78 y.o. female presenting with fall. The history is provided by the patient.  Fall Pertinent negatives include no fever, no numbness, no abdominal pain, no vomiting and no headaches.  pt states was outside picking berries, bent over, the fell onto left shoulder. C.o left shoulder pain, constant, dull, non radiating. Hit head, ?momentary loc. No neck/back pain. No numbness/weakness. Family notes hx frequent falls. Pt states when bent over did feel lightheaded/dizzy. Denies vertigo or room spinning. No chest pain or discomfort. No sob. No palpitations or irregular heartbeat feeling. Has pacemaker. Denies any recent new meds or change in meds. No fever or chills. States normal appetite, had eaten today. No gu c/o.   Past Medical History  Diagnosis Date  . Coronary artery disease     status post LAD stenting in 1999 after NSTEMI; CABG 2004; Last LHC 7/11: Proximal LAD 80%, mid 95%, distal 40%, mid R. High 40%, proximal RCA 40%, LIMA-LAD patent, SVG-PDA patent, EF 65%.   . Hypertension   . Hyperlipidemia   . Carotid artery disease     status post right carotid endarterectomy.  . Degenerative joint disease   . Restless leg syndrome   . Mild renal insufficiency   . Heparin induced thrombocytopenia   . Diphtheria     "as a child"  . Pleural effusion, bilateral 06/2003  . Pseudoaneurysm     status post repair following catherization in 1999  . Chronic systolic heart failure 10/02/11  . Ischemic cardiomyopathy     Echocardiogram 10/01/11: Mild LVH, EF 35-40%, basal inferior, basal to mid posterior and basal to mid anterolateral severe hypokinesis, mild to moderate AI, moderate MR (ischemic MR), moderate LAE, mild  RVE, mildly reduced RV systolic function, mild RAE, PASP 43-44  . COPD (chronic obstructive pulmonary disease)   . Atrial fibrillation/flutter     s/p multiple DCCVs;  amiodarone started 09/2011, TEE DCCV 3/13  . Tachycardia-bradycardia syndrome     s/p Medtronic pacemaker implant 09/2011 (8 sec pause noted)  . GERD (gastroesophageal reflux disease)   . Renal artery stenosis     bilateral- status post stenting  . Non-functioning kidney   . Depression     "cries alot"    Past Surgical History  Procedure Date  . Carotid endarterectomy 05/2003    right  . Pacemaker insertion 10/02/11    implanted by Dr Johney Frame  . Cholecystectomy ?02/2011  . Tonsillectomy and adenoidectomy   . Renal artery stent 06/2003    bilaterally/e-chart  . Thoracentesis ? 2000; 05/2011  . Coronary angioplasty with stent placement 1999  . Av fistula repair 01/1998    w/pseudoaneurysm repair S/P catheterization/E-chart  . Coronary artery bypass graft 05/2003    CABG X 3  . Tee without cardioversion 10/23/2011    Procedure: TRANSESOPHAGEAL ECHOCARDIOGRAM (TEE);  Surgeon: Wendall Stade, MD;  Location: [facility] ENDOSCOPY;  Service: Cardiovascular;  Laterality: N/A;  . Cardioversion 10/23/2011    Procedure: CARDIOVERSION;  Surgeon: Wendall Stade, MD;  Location: [facility] ENDOSCOPY;  Service: Cardiovascular;  Laterality: N/A;    Family History  Problem Relation Age of Onset  . Cancer Mother 13    died  . Lung disease Father 49    died  .  Heart disease Sister     2 sisters and her son  . Anesthesia problems Neg Hx     History  Substance Use Topics  . Smoking status: Never Smoker   . Smokeless tobacco: Never Used  . Alcohol Use: No    OB History    Grav Para Term Preterm Abortions TAB SAB Ect Mult Living                  Review of Systems  Constitutional: Negative for fever and chills.  HENT: Negative for neck pain.   Eyes: Negative for visual disturbance.  Respiratory: Negative for cough and shortness of  breath.   Cardiovascular: Negative for chest pain and palpitations.  Gastrointestinal: Negative for vomiting and abdominal pain.  Genitourinary: Negative for flank pain.  Musculoskeletal: Negative for back pain.  Skin: Negative for rash.  Neurological: Negative for weakness, numbness and headaches.  Hematological: Does not bruise/bleed easily.  Psychiatric/Behavioral: Negative for confusion.    Allergies  Ramipril; Contrast media; Heparin; Hydromorphone hcl; and Ramipril  Home Medications   Current Outpatient Rx  Name Route Sig Dispense Refill  . ALPRAZOLAM 0.25 MG PO TABS Oral Take 0.25 mg by mouth at bedtime as needed. For sleep    . AMIODARONE HCL 200 MG PO TABS Oral Take 1 tablet (200 mg total) by mouth daily. 90 tablet 2  . BISACODYL 5 MG PO TBEC Oral Take 5 mg by mouth daily as needed. For constipation    . CARVEDILOL 12.5 MG PO TABS Oral Take 1 tablet (12.5 mg total) by mouth 2 (two) times daily with a meal. 120 tablet 3  . VITAMIN B-12 IJ Injection Inject 1,000 mcg as directed. Every two weeks.    Marland Kitchen DILTIAZEM HCL ER COATED BEADS 120 MG PO CP24 Oral Take 1 capsule (120 mg total) by mouth daily. 90 capsule 2  . FUROSEMIDE 40 MG PO TABS Oral Take 1 tablet (40 mg total) by mouth daily. 30 tablet 3  . HYDROCODONE-ACETAMINOPHEN 7.5-325 MG PO TABS Oral Take 1 tablet by mouth every 4 (four) hours as needed. For pain    . NITROGLYCERIN 0.4 MG SL SUBL Sublingual Place 0.4 mg under the tongue every 5 (five) minutes as needed. For chest pain    . OMEPRAZOLE 20 MG PO CPDR Oral Take 20 mg by mouth daily.     Marland Kitchen POTASSIUM CHLORIDE CRYS ER 20 MEQ PO TBCR Oral Take 1 tablet (20 mEq total) by mouth daily. 30 tablet 3  . ROSUVASTATIN CALCIUM 5 MG PO TABS Oral Take 1 tablet (5 mg total) by mouth daily at 6 PM. 90 tablet 2  . SPIRONOLACTONE 25 MG PO TABS Oral Take 0.5 tablets (12.5 mg total) by mouth daily. 45 tablet 3  . VITAMIN D (ERGOCALCIFEROL) 50000 UNITS PO CAPS Oral Take 50,000 Units by mouth  every 7 (seven) days. On Fridays    . WARFARIN SODIUM 5 MG PO TABS Oral Take 0.5 tablets (2.5 mg total) by mouth See admin instructions. Please take 2.5mg  (one half tablet) daily until INR checked at Coumadin clinic. 30 tablet 1    BP 174/71  Pulse 88  Temp 98.2 F (36.8 C) (Oral)  Resp 20  Ht 5\' 4"  (1.626 m)  Wt 110 lb (49.896 kg)  BMI 18.88 kg/m2  SpO2 98%  Physical Exam  Nursing note and vitals reviewed. Constitutional: She is oriented to person, place, and time. She appears well-developed and well-nourished. No distress.  HENT:  Head: Atraumatic.  Nose: Nose normal.  Mouth/Throat: Oropharynx is clear and moist.  Eyes: Conjunctivae and EOM are normal. Pupils are equal, round, and reactive to light. No scleral icterus.  Neck: Neck supple. No tracheal deviation present. No thyromegaly present.       No bruit  Cardiovascular: Normal rate, regular rhythm, normal heart sounds and intact distal pulses.  Exam reveals no gallop and no friction rub.   No murmur heard. Pulmonary/Chest: Effort normal and breath sounds normal. No respiratory distress. She exhibits no tenderness.  Abdominal: Soft. Normal appearance and bowel sounds are normal. She exhibits no distension. There is no tenderness.       No puls mass  Genitourinary:       No cva tenderness.  Musculoskeletal: Normal range of motion. She exhibits no edema and no tenderness.       Left shoulder sts and tenderness. Radial pulse 2+. CTLS spine, non tender, aligned, no step off.   Neurological: She is alert and oriented to person, place, and time. No cranial nerve deficit.       Motor intact bilaterally. R/U/M n intact LUE.  Skin: Skin is warm and dry. No rash noted. She is not diaphoretic.  Psychiatric: She has a normal mood and affect.    ED Course  Procedures (including critical care time)    Results for orders placed during the hospital encounter of 02/18/12  CBC      Component Value Range   WBC 12.2 (*) 4.0 - 10.5  K/uL   RBC 4.09  3.87 - 5.11 MIL/uL   Hemoglobin 11.7 (*) 12.0 - 15.0 g/dL   HCT 16.1 (*) 09.6 - 04.5 %   MCV 87.5  78.0 - 100.0 fL   MCH 28.6  26.0 - 34.0 pg   MCHC 32.7  30.0 - 36.0 g/dL   RDW 40.9 (*) 81.1 - 91.4 %   Platelets 298  150 - 400 K/uL  COMPREHENSIVE METABOLIC PANEL      Component Value Range   Sodium 132 (*) 135 - 145 mEq/L   Potassium 4.7  3.5 - 5.1 mEq/L   Chloride 96  96 - 112 mEq/L   CO2 24  19 - 32 mEq/L   Glucose, Bld 132 (*) 70 - 99 mg/dL   BUN 46 (*) 6 - 23 mg/dL   Creatinine, Ser 7.82 (*) 0.50 - 1.10 mg/dL   Calcium 9.9  8.4 - 95.6 mg/dL   Total Protein 7.8  6.0 - 8.3 g/dL   Albumin 4.0  3.5 - 5.2 g/dL   AST 20  0 - 37 U/L   ALT 13  0 - 35 U/L   Alkaline Phosphatase 74  39 - 117 U/L   Total Bilirubin 0.3  0.3 - 1.2 mg/dL   GFR calc non Af Amer 21 (*) >90 mL/min   GFR calc Af Amer 25 (*) >90 mL/min  URINALYSIS, ROUTINE W REFLEX MICROSCOPIC      Component Value Range   Color, Urine YELLOW  YELLOW   APPearance CLEAR  CLEAR   Specific Gravity, Urine 1.010  1.005 - 1.030   pH 7.5  5.0 - 8.0   Glucose, UA NEGATIVE  NEGATIVE mg/dL   Hgb urine dipstick NEGATIVE  NEGATIVE   Bilirubin Urine NEGATIVE  NEGATIVE   Ketones, ur NEGATIVE  NEGATIVE mg/dL   Protein, ur NEGATIVE  NEGATIVE mg/dL   Urobilinogen, UA 0.2  0.0 - 1.0 mg/dL   Nitrite NEGATIVE  NEGATIVE   Leukocytes, UA NEGATIVE  NEGATIVE   Dg Wrist Complete Left  02/18/2012  *RADIOLOGY REPORT*  Clinical Data: Fall with pain.  LEFT WRIST - COMPLETE 3+ VIEW  Comparison: None.  Findings: Mild osteopenia.  Expected for age degenerative changes, including about the radial aspect of the carpus. No acute fracture or dislocation.  Scaphoid intact.  IMPRESSION:  No acute findings about the left wrist.  Original Report Authenticated By: Consuello Bossier, M.D.   Ct Head Wo Contrast  02/18/2012  *RADIOLOGY REPORT*  Clinical Data: Fall, trauma  CT HEAD WITHOUT CONTRAST  Technique:  Contiguous axial images were obtained  from the base of the skull through the vertex without contrast.  Comparison: None.  Findings: Diffuse brain atrophy noted with periventricular white matter microvascular ischemic changes.  No acute intracranial hemorrhage, mass lesion, definite acute infarction, midline shift, herniation, hydrocephalus, or extra-axial fluid collection.  Gray- white matter differentiation maintained.  Cisterns patent. Cerebellar atrophy as well.  Mastoids and sinuses clear.  Orbits symmetric.  IMPRESSION: Atrophy and microvascular ischemic changes.  Original Report Authenticated By: Judie Petit. Ruel Favors, M.D.   Dg Shoulder Left  02/18/2012  *RADIOLOGY REPORT*  Clinical Data: Left humerus fracture secondary to a fall.  LEFT SHOULDER - 2+ VIEW  Comparison: Radiographs dated 09/12/2009 and humerus images dated 02/18/2012  Findings: There is a comminuted slightly impacted fracture of the proximal left humeral shaft without angulation.  The patient has severe osteoarthritis of the glenohumeral joint with slight posterior subluxation of the humeral head but there is no dislocation.  No other acute abnormality.  IMPRESSION: Fracture of the proximal humerus as described.  Severe osteoarthritis of the glenohumeral joint.  Original Report Authenticated By: Gwynn Burly, M.D.   Dg Knee Complete 4 Views Left  02/18/2012  *RADIOLOGY REPORT*  Clinical Data: Recent fall, pain  LEFT KNEE - COMPLETE 4+ VIEW  Comparison: None.  Findings: No acute fracture is seen.  Mild degenerative joint disease is noted.  No effusion is seen.  Surgical clips overlie the medial left knee.  IMPRESSION: No fracture.  No effusion.  Original Report Authenticated By: Juline Patch, M.D.   Dg Humerus Left  02/18/2012  *RADIOLOGY REPORT*  Clinical Data: Fall today with pain.  LEFT HUMERUS - 2+ VIEW  Comparison: 09/12/2009 shoulder films.  Findings: Incompletely imaged left-sided pacer.  Underlying glenohumeral joint osteoarthritis.  Likely comminuted proximal humerus  fracture, suboptimally evaluated.  No dislocation identified.  Underlying moderate osteopenia.  IMPRESSION: Proximal humerus fracture, likely comminuted.  Recommend further evaluation with dedicated shoulder films.  Original Report Authenticated By: Consuello Bossier, M.D.        MDM  Iv ns. Morphine iv. zofran iv. Labs.     Date: 02/18/2012  Rate: 71  Rhythm: e pacing  QRS Axis: left  Intervals: epacing  ST/T Wave abnormalities: epaced  Conduction Disutrbances:epaced  Narrative Interpretation:   Old EKG Reviewed: changes noted   Recheck pain improved.   Shoulder immobilizer, icepack. Discussed xrays w pt and family.  Pt stays w daughter.  Will discuss w Dr Hilda Lias, pts orthopedist.   He states if admitted to med team they can consult him..  On recheck pt feels she did not trip and fall but that fall was precipitated by faintness/dizziness, ?presyncope verses transient syncopal event. On labs/exam pt appears volume depleted w bun/cr abnormal and above baseline. ivf in ed. Will call triad to admit.    Recheck pt more comfortably. Does require additional narcotic pain medication for pain control.  Discussed w triad, they state admit to team 2, tele.     Suzi Roots, MD 02/18/12 1756  Suzi Roots, MD 02/18/12 Barry Brunner

## 2012-02-18 NOTE — ED Notes (Signed)
Report called to [facility] RN for bed 341.

## 2012-02-18 NOTE — H&P (Addendum)
[name] [name] is an 78 y.o. female.    PCP: Josue Hector, MD  Her cardiologist is Dr. Antoine Poche Her orthopedic doctor is Dr. Hilda Lias  Chief Complaint: Passed out  HPI: This is 78 year old, pleasant, white female with a past medical history of atrial fibrillation, pacemaker, on chronic anticoagulation, hypertension, coronary artery disease, gout, who was sitting on the porch earlier this morning. Her daughter went out for some errands. The patient decided to walk into the yard to gather some blackberries. She bent over to pick one of blackberries she passed out and next thing she knew she was waking up on the floor. She had excruciating pain in the left shoulder area. She yelled out and her son-in-law came to check on her and helped her into the house. Subsequently, she was brought into the hospital for further evaluation. While she was lying on the floor patient had nausea and she threw up a couple times. Denies any blood in the emesis. She denies any focal weakness. Has had had shortness of breath, or while which has been somewhat worse over the past few weeks according to the daughter. Had some chest pain versus palpitation, but she is unable to describe it any further.  She also tells me that this is probably the seventh time that she has fallen down in the last 6 weeks. And every time it is associated with a syncopal episode. A few days ago she was started on steroids probably for acute gout in her knee and her wrist. As a result of it she has noted swelling in the face. She does use oxygen at home at times. She did have dizziness earlier today, which she describes as feeling swimmy headed. Denies any vertiginous symptoms. She also tells me that about a week ago she had vaginal bleeding, which lasted one day. She hasn't had any vaginal discharge since then. History was somewhat limited.   Home Medications: Prior to Admission medications   Medication Sig Start Date End Date Taking?  Authorizing Provider  ALPRAZolam (XANAX) 0.25 MG tablet Take 0.25 mg by mouth at bedtime as needed. For sleep   Yes Historical Provider, MD  amiodarone (PACERONE) 200 MG tablet Take 200 mg by mouth at bedtime. 11/02/11 11/01/12 Yes Rollene Rotunda, MD  bisacodyl (DULCOLAX) 5 MG EC tablet Take 5 mg by mouth daily as needed. For constipation   Yes Historical Provider, MD  carvedilol (COREG) 12.5 MG tablet Take 1 tablet (12.5 mg total) by mouth 2 (two) times daily with a meal. 01/17/12 01/16/13 Yes Hillis Range, MD  Cyanocobalamin (VITAMIN B-12 IJ) Inject 1,000 mcg as directed. Every two weeks.   Yes Historical Provider, MD  diltiazem (CARDIZEM CD) 120 MG 24 hr capsule Take 120 mg by mouth at bedtime. 11/02/11 11/01/12 Yes Rollene Rotunda, MD  furosemide (LASIX) 40 MG tablet Take 40 mg by mouth at bedtime. 10/22/11  Yes Beatrice Lecher, PA  HYDROcodone-acetaminophen (NORCO) 7.5-325 MG per tablet Take 1 tablet by mouth every 4 (four) hours as needed. For pain   Yes Historical Provider, MD  nitroGLYCERIN (NITROSTAT) 0.4 MG SL tablet Place 0.4 mg under the tongue every 5 (five) minutes as needed. For chest pain   Yes Historical Provider, MD  omeprazole (PRILOSEC) 20 MG capsule Take 20 mg by mouth at bedtime.    Yes Historical Provider, MD  potassium chloride SA (K-DUR,KLOR-CON) 20 MEQ tablet Take 20 mEq by mouth every evening. 10/22/11  Yes Beatrice Lecher, PA  predniSONE (DELTASONE) 10  MG tablet Take 10 mg by mouth as directed. Per 6 day package instructions. (6,5,4,3,2,1)   Yes Historical Provider, MD  rosuvastatin (CRESTOR) 5 MG tablet Take 1 tablet (5 mg total) by mouth daily at 6 PM. 11/02/11 11/01/12 Yes Rollene Rotunda, MD  spironolactone (ALDACTONE) 25 MG tablet Take 12.5 mg by mouth at bedtime. 11/07/11 11/06/12 Yes Rollene Rotunda, MD  Vitamin D, Ergocalciferol, (DRISDOL) 50000 UNITS CAPS Take 50,000 Units by mouth every 7 (seven) days. On Fridays   Yes Historical Provider, MD  warfarin (COUMADIN) 5 MG tablet Take  2.5 mg by mouth See admin instructions. Please take 2.5mg  (one half tablet) every other day. 10/08/11  Yes Roger Colin Benton, PA-C    Allergies:  Allergies  Allergen Reactions  . Ramipril Hives  . Contrast Media (Iodinated Diagnostic Agents) Other (See Comments)    Reaction noted by md  . Heparin Other (See Comments)    Clots blood instead of thinning  . Hydromorphone Hcl Other (See Comments)    Crazy feeling  . Ramipril Hives    Past Medical History: Past Medical History  Diagnosis Date  . Coronary artery disease     status post LAD stenting in 1999 after NSTEMI; CABG 2004; Last LHC 7/11: Proximal LAD 80%, mid 95%, distal 40%, mid R. High 40%, proximal RCA 40%, LIMA-LAD patent, SVG-PDA patent, EF 65%.   . Hypertension   . Hyperlipidemia   . Carotid artery disease     status post right carotid endarterectomy.  . Degenerative joint disease   . Restless leg syndrome   . Mild renal insufficiency   . Heparin induced thrombocytopenia   . Diphtheria     "as a child"  . Pleural effusion, bilateral 06/2003  . Pseudoaneurysm     status post repair following catherization in 1999  . Chronic systolic heart failure 10/02/11  . Ischemic cardiomyopathy     Echocardiogram 10/01/11: Mild LVH, EF 35-40%, basal inferior, basal to mid posterior and basal to mid anterolateral severe hypokinesis, mild to moderate AI, moderate MR (ischemic MR), moderate LAE, mild RVE, mildly reduced RV systolic function, mild RAE, PASP 43-44  . COPD (chronic obstructive pulmonary disease)   . Atrial fibrillation/flutter     s/p multiple DCCVs;  amiodarone started 09/2011, TEE DCCV 3/13  . Tachycardia-bradycardia syndrome     s/p [employer] pacemaker implant 09/2011 (8 sec pause noted)  . GERD (gastroesophageal reflux disease)   . Renal artery stenosis     bilateral- status post stenting  . Non-functioning kidney   . Depression     "cries alot"  . Gout     Past Surgical History  Procedure Date  . Carotid  endarterectomy 05/2003    right  . Pacemaker insertion 10/02/11    implanted by Dr Johney Frame  . Cholecystectomy ?02/2011  . Tonsillectomy and adenoidectomy   . Renal artery stent 06/2003    bilaterally/e-chart  . Thoracentesis ? 2000; 05/2011  . Coronary angioplasty with stent placement 1999  . Av fistula repair 01/1998    w/pseudoaneurysm repair S/P catheterization/E-chart  . Coronary artery bypass graft 05/2003    CABG X 3  . Tee without cardioversion 10/23/2011    Procedure: TRANSESOPHAGEAL ECHOCARDIOGRAM (TEE);  Surgeon: Wendall Stade, MD;  Location: [facility] ENDOSCOPY;  Service: Cardiovascular;  Laterality: N/A;  . Cardioversion 10/23/2011    Procedure: CARDIOVERSION;  Surgeon: Wendall Stade, MD;  Location: [facility] ENDOSCOPY;  Service: Cardiovascular;  Laterality: N/A;    Social History:  reports that  she has never smoked. She has never used smokeless tobacco. She reports that she does not drink alcohol or use illicit drugs.  Family History:  Family History  Problem Relation Age of Onset  . Cancer Mother 61    died  . Lung disease Father 51    died  . Heart disease Sister     2 sisters and her son  . Anesthesia problems Neg Hx     Review of Systems - History obtained from the patient General ROS: negative Psychological ROS: negative Ophthalmic ROS: negative ENT ROS: negative Allergy and Immunology ROS: negative Hematological and Lymphatic ROS: negative Endocrine ROS: negative Respiratory ROS: as in hpi Cardiovascular ROS: as in hpi Gastrointestinal ROS: no abdominal pain, change in bowel habits, or black or bloody stools Genito-Urinary ROS: as in hpi Musculoskeletal ROS: as in hpi Neurological ROS: no TIA or stroke symptoms Dermatological ROS: negative  Physical Examination Blood pressure 165/66, pulse 74, temperature 98.2 F (36.8 C), temperature source Oral, resp. rate 17, height 5\' 4"  (1.626 m), weight 49.896 kg (110 lb), SpO2 96.00%.  General appearance: alert,  cooperative, appears stated age and no distress Head: Normocephalic, without obvious abnormality, atraumatic Eyes: conjunctivae/corneas clear. PERRL, EOM's intact.  Throat: lips, mucosa, and tongue normal; teeth and gums normal Neck: no adenopathy, no carotid bruit, no JVD, supple, symmetrical, trachea midline and thyroid not enlarged, symmetric, no tenderness/mass/nodules Resp: clear to auscultation bilaterally Cardio: regular rate and rhythm, S1, S2 normal, no murmur, click, rub or gallop GI: soft, non-tender; bowel sounds normal; no masses,  no organomegaly Extremities: Left shoulder is very tender to palpation Pulses: 2+ and symmetric Skin: Skin color, texture, turgor normal. No rashes or lesions Lymph nodes: Cervical, supraclavicular, and axillary nodes normal. Neurologic: Alert and oriented X 3. No focal neurological deficits are present.  Laboratory Data: Results for orders placed during the hospital encounter of 02/18/12 (from the past 48 hour(s))  CBC     Status: Abnormal   Collection Time   02/18/12  3:56 PM      Component Value Range Comment   WBC 12.2 (*) 4.0 - 10.5 K/uL    RBC 4.09  3.87 - 5.11 MIL/uL    Hemoglobin 11.7 (*) 12.0 - 15.0 g/dL    HCT 57.8 (*) 46.9 - 46.0 %    MCV 87.5  78.0 - 100.0 fL    MCH 28.6  26.0 - 34.0 pg    MCHC 32.7  30.0 - 36.0 g/dL    RDW 62.9 (*) 52.8 - 15.5 %    Platelets 298  150 - 400 K/uL   COMPREHENSIVE METABOLIC PANEL     Status: Abnormal   Collection Time   02/18/12  3:56 PM      Component Value Range Comment   Sodium 132 (*) 135 - 145 mEq/L    Potassium 4.7  3.5 - 5.1 mEq/L    Chloride 96  96 - 112 mEq/L    CO2 24  19 - 32 mEq/L    Glucose, Bld 132 (*) 70 - 99 mg/dL    BUN 46 (*) 6 - 23 mg/dL    Creatinine, Ser 4.13 (*) 0.50 - 1.10 mg/dL    Calcium 9.9  8.4 - 24.4 mg/dL    Total Protein 7.8  6.0 - 8.3 g/dL    Albumin 4.0  3.5 - 5.2 g/dL    AST 20  0 - 37 U/L    ALT 13  0 - 35 U/L  Alkaline Phosphatase 74  39 - 117 U/L    Total  Bilirubin 0.3  0.3 - 1.2 mg/dL    GFR calc non Af Amer 21 (*) >90 mL/min    GFR calc Af Amer 25 (*) >90 mL/min   PROTIME-INR     Status: Abnormal   Collection Time   02/18/12  3:56 PM      Component Value Range Comment   Prothrombin Time 20.3 (*) 11.6 - 15.2 seconds    INR 1.70 (*) 0.00 - 1.49   URINALYSIS, ROUTINE W REFLEX MICROSCOPIC     Status: Normal   Collection Time   02/18/12  5:30 PM      Component Value Range Comment   Color, Urine YELLOW  YELLOW    APPearance CLEAR  CLEAR    Specific Gravity, Urine 1.010  1.005 - 1.030    pH 7.5  5.0 - 8.0    Glucose, UA NEGATIVE  NEGATIVE mg/dL    Hgb urine dipstick NEGATIVE  NEGATIVE    Bilirubin Urine NEGATIVE  NEGATIVE    Ketones, ur NEGATIVE  NEGATIVE mg/dL    Protein, ur NEGATIVE  NEGATIVE mg/dL    Urobilinogen, UA 0.2  0.0 - 1.0 mg/dL    Nitrite NEGATIVE  NEGATIVE    Leukocytes, UA NEGATIVE  NEGATIVE MICROSCOPIC NOT DONE ON URINES WITH NEGATIVE PROTEIN, BLOOD, LEUKOCYTES, NITRITE, OR GLUCOSE <1000 mg/dL.    Radiology Reports: Dg Wrist Complete Left  02/18/2012  *RADIOLOGY REPORT*  Clinical Data: Fall with pain.  LEFT WRIST - COMPLETE 3+ VIEW  Comparison: None.  Findings: Mild osteopenia.  Expected for age degenerative changes, including about the radial aspect of the carpus. No acute fracture or dislocation.  Scaphoid intact.  IMPRESSION:  No acute findings about the left wrist.  Original Report Authenticated By: Consuello Bossier, M.D.   Ct Head Wo Contrast  02/18/2012  *RADIOLOGY REPORT*  Clinical Data: Fall, trauma  CT HEAD WITHOUT CONTRAST  Technique:  Contiguous axial images were obtained from the base of the skull through the vertex without contrast.  Comparison: None.  Findings: Diffuse brain atrophy noted with periventricular white matter microvascular ischemic changes.  No acute intracranial hemorrhage, mass lesion, definite acute infarction, midline shift, herniation, hydrocephalus, or extra-axial fluid collection.  Gray- white  matter differentiation maintained.  Cisterns patent. Cerebellar atrophy as well.  Mastoids and sinuses clear.  Orbits symmetric.  IMPRESSION: Atrophy and microvascular ischemic changes.  Original Report Authenticated By: Judie Petit. Ruel Favors, M.D.   Dg Shoulder Left  02/18/2012  *RADIOLOGY REPORT*  Clinical Data: Left humerus fracture secondary to a fall.  LEFT SHOULDER - 2+ VIEW  Comparison: Radiographs dated 09/12/2009 and humerus images dated 02/18/2012  Findings: There is a comminuted slightly impacted fracture of the proximal left humeral shaft without angulation.  The patient has severe osteoarthritis of the glenohumeral joint with slight posterior subluxation of the humeral head but there is no dislocation.  No other acute abnormality.  IMPRESSION: Fracture of the proximal humerus as described.  Severe osteoarthritis of the glenohumeral joint.  Original Report Authenticated By: Gwynn Burly, M.D.   Dg Knee Complete 4 Views Left  02/18/2012  *RADIOLOGY REPORT*  Clinical Data: Recent fall, pain  LEFT KNEE - COMPLETE 4+ VIEW  Comparison: None.  Findings: No acute fracture is seen.  Mild degenerative joint disease is noted.  No effusion is seen.  Surgical clips overlie the medial left knee.  IMPRESSION: No fracture.  No effusion.  Original Report  Authenticated By: Juline Patch, M.D.   Dg Humerus Left  02/18/2012  *RADIOLOGY REPORT*  Clinical Data: Fall today with pain.  LEFT HUMERUS - 2+ VIEW  Comparison: 09/12/2009 shoulder films.  Findings: Incompletely imaged left-sided pacer.  Underlying glenohumeral joint osteoarthritis.  Likely comminuted proximal humerus fracture, suboptimally evaluated.  No dislocation identified.  Underlying moderate osteopenia.  IMPRESSION: Proximal humerus fracture, likely comminuted.  Recommend further evaluation with dedicated shoulder films.  Original Report Authenticated By: Consuello Bossier, M.D.    Electrocardiogram: EKG shows a paced rhythm at 71 beats per minute. Said do  a paced rhythm. No fusion beats are appreciated.  Assessment/Plan  Principal Problem:  *Syncope and collapse Active Problems:  MITRAL REGURGITATION  HYPERTENSION  CAD  Atrial fibrillation  Left humeral fracture  Chronic systolic CHF (congestive heart failure)  Cardiac pacemaker in situ  ARF (acute renal failure)  Vaginal bleeding  Gout   #1 syncope and collapse: History is very suggestive of cardiac syncope. We will monitor her on telemetry. Will have cardiology see her in the morning and have her pacemaker interrogated at that time. Interestingly, she was seen by her electrophysiologist in June and at that time the pacemaker was functioning normally. Other differential diagnoses include venous thromboembolism, which is somewhat less likely considering that she is already on warfarin. However, she is subtherapeutic. Will check a d-dimer. It doesn't appear to be a neurological event at this time. Chest x-ray will be ordered.  #2 left humeral fracture: Orthopedic doctor has been consulted by the ED physician. We will have them evaluate the patient in the hospital.  #3. Mild acute renal failure with dehydration with mild hyponatremia: She'll be given gentle IV hydration. Her Lasix will be held for tonight. Labs will be followed up in the morning.  #4 Transient vaginal bleeding: Might have been due to supratherapeutic INR. We will get a pelvic ultrasound to make, sure there is no intrauterine abnormalities.  #5 history of atrial fibrillation and pacemaker placement: She is on anticoagulation. Considering her multiple falls the risk of warfarin may outweigh benefits. However, we'll have get cardiology input on this tomorrow. We'll have pharmacy dose of warfarin for now. Continue with amiodarone.  #6 history of cardiac disease, status post CABG: This is stable. Continue with beta blocker.  #7 chronic systolic congestive heart failure with EF of about 35-40%: She appears to be euvolemic at  this time. Continue to monitor closely.  #8 history of gout with acute gouty inflammation, diagnosed a few days ago. It appears, that she was started on prednisone at this time. She tells me that she is still on this and so, we will continue it for now.  #9 history of hypertension: Blood pressure stable. Orthostatic hypotension will be ruled out.  #10 CODE STATUS was discussed with the patient in front of her daughters. She is a DNR/DNI.  Further management decisions will depend on results of further testing and patient's response to treatment.    [facility]  [facility] Pager [phone]  02/18/2012, 8:29 PM

## 2012-02-18 NOTE — ED Notes (Signed)
Pt requesting pain medication. Dr. Denton Lank made aware

## 2012-02-18 NOTE — ED Notes (Signed)
Fell 12noon today while berry picking, Bent over then fell .  Contusion to lt forearm and pain lt shoulder,  ?Loc.  Able to get up from ground with help .  "I hurt all over"

## 2012-02-18 NOTE — ED Notes (Signed)
Family reports pt also "fell on her head when she blacked out. And her left knee hurts"

## 2012-02-19 ENCOUNTER — Inpatient Hospital Stay (HOSPITAL_COMMUNITY): Payer: Medicare Other

## 2012-02-19 ENCOUNTER — Encounter (HOSPITAL_COMMUNITY): Payer: Self-pay

## 2012-02-19 DIAGNOSIS — E860 Dehydration: Secondary | ICD-10-CM

## 2012-02-19 DIAGNOSIS — R55 Syncope and collapse: Secondary | ICD-10-CM

## 2012-02-19 LAB — COMPREHENSIVE METABOLIC PANEL
ALT: 12 U/L (ref 0–35)
AST: 17 U/L (ref 0–37)
CO2: 25 mEq/L (ref 19–32)
Chloride: 103 mEq/L (ref 96–112)
Creatinine, Ser: 1.88 mg/dL — ABNORMAL HIGH (ref 0.50–1.10)
GFR calc non Af Amer: 23 mL/min — ABNORMAL LOW (ref >90)
Total Bilirubin: 0.4 mg/dL (ref 0.3–1.2)

## 2012-02-19 LAB — CARDIAC PANEL(CRET KIN+CKTOT+MB+TROPI)
CK, MB: 2.1 ng/mL (ref 0.3–4.0)
CK, MB: 2.2 ng/mL (ref 0.3–4.0)
Relative Index: INVALID (ref 0.0–2.5)
Relative Index: INVALID (ref 0.0–2.5)
Total CK: 23 U/L (ref 7–177)
Troponin I: <0.3 ng/mL (ref <0.30)

## 2012-02-19 LAB — PROTIME-INR: INR: 1.93 — ABNORMAL HIGH (ref 0.00–1.49)

## 2012-02-19 LAB — CBC
HCT: 32.5 % — ABNORMAL LOW (ref 36.0–46.0)
MCV: 89.5 fL (ref 78.0–100.0)
Platelets: 243 10*3/uL (ref 150–400)
RBC: 3.63 MIL/uL — ABNORMAL LOW (ref 3.87–5.11)
WBC: 10.9 10*3/uL — ABNORMAL HIGH (ref 4.0–10.5)

## 2012-02-19 MED ORDER — TECHNETIUM TO 99M ALBUMIN AGGREGATED
3.0000 | Freq: Once | INTRAVENOUS | Status: AC | PRN
  Administered 2012-02-19: 3.3 via INTRAVENOUS

## 2012-02-19 MED ORDER — WARFARIN SODIUM 2.5 MG PO TABS
2.5000 mg | ORAL_TABLET | Freq: Once | ORAL | Status: AC
  Administered 2012-02-19: 2.5 mg via ORAL
  Filled 2012-02-19: qty 1

## 2012-02-19 NOTE — Consult Note (Signed)
Reason for Consult:Fracture of left proximal humerus Referring Physician: Hospitalist  [name] [name] is an 78 y.o. female.  HPI: She fell at home last night and hurt her left shoulder. She was seen in the ER. X-rays showed a nondisplaced fracture of the left proximal humerus.  She was given a shoulder immobilizer.  She has a cardiac pacemaker on the left. She has been unsteady at home recently which caused the fall.  Past Medical History  Diagnosis Date  . Coronary artery disease     status post LAD stenting in 1999 after NSTEMI; CABG 2004; Last LHC 7/11: Proximal LAD 80%, mid 95%, distal 40%, mid R. High 40%, proximal RCA 40%, LIMA-LAD patent, SVG-PDA patent, EF 65%.   . Hypertension   . Hyperlipidemia   . Carotid artery disease     status post right carotid endarterectomy.  . Degenerative joint disease   . Restless leg syndrome   . Mild renal insufficiency   . Heparin induced thrombocytopenia   . Diphtheria     "as a child"  . Pleural effusion, bilateral 06/2003  . Pseudoaneurysm     status post repair following catherization in 1999  . Chronic systolic heart failure 10/02/11  . Ischemic cardiomyopathy     Echocardiogram 10/01/11: Mild LVH, EF 35-40%, basal inferior, basal to mid posterior and basal to mid anterolateral severe hypokinesis, mild to moderate AI, moderate MR (ischemic MR), moderate LAE, mild RVE, mildly reduced RV systolic function, mild RAE, PASP 43-44  . COPD (chronic obstructive pulmonary disease)   . Atrial fibrillation/flutter     s/p multiple DCCVs;  amiodarone started 09/2011, TEE DCCV 3/13  . Tachycardia-bradycardia syndrome     s/p Medtronic pacemaker implant 09/2011 (8 sec pause noted)  . GERD (gastroesophageal reflux disease)   . Renal artery stenosis     bilateral- status post stenting  . Non-functioning kidney   . Depression     "cries alot"  . Gout     Past Surgical History  Procedure Date  . Carotid endarterectomy 05/2003    right  . Pacemaker  insertion 10/02/11    implanted by Dr Johney Frame  . Cholecystectomy ?02/2011  . Tonsillectomy and adenoidectomy   . Renal artery stent 06/2003    bilaterally/e-chart  . Thoracentesis ? 2000; 05/2011  . Coronary angioplasty with stent placement 1999  . Av fistula repair 01/1998    w/pseudoaneurysm repair S/P catheterization/E-chart  . Coronary artery bypass graft 05/2003    CABG X 3  . Tee without cardioversion 10/23/2011    Procedure: TRANSESOPHAGEAL ECHOCARDIOGRAM (TEE);  Surgeon: Wendall Stade, MD;  Location: [facility] ENDOSCOPY;  Service: Cardiovascular;  Laterality: N/A;  . Cardioversion 10/23/2011    Procedure: CARDIOVERSION;  Surgeon: Wendall Stade, MD;  Location: [facility] ENDOSCOPY;  Service: Cardiovascular;  Laterality: N/A;    Family History  Problem Relation Age of Onset  . Cancer Mother 49    died  . Lung disease Father 47    died  . Heart disease Sister     2 sisters and her son  . Anesthesia problems Neg Hx     Social History:  reports that she has never smoked. She has never used smokeless tobacco. She reports that she does not drink alcohol or use illicit drugs.  Allergies:  Allergies  Allergen Reactions  . Ramipril Hives  . Contrast Media (Iodinated Diagnostic Agents) Other (See Comments)    Reaction noted by md  . Heparin Other (See Comments)  Clots blood instead of thinning  . Hydromorphone Hcl Other (See Comments)    Crazy feeling  . Ramipril Hives    Medications: I have reviewed the patient's current medications.  Results for orders placed during the hospital encounter of 02/18/12 (from the past 48 hour(s))  CBC     Status: Abnormal   Collection Time   02/18/12  3:56 PM      Component Value Range Comment   WBC 12.2 (*) 4.0 - 10.5 K/uL    RBC 4.09  3.87 - 5.11 MIL/uL    Hemoglobin 11.7 (*) 12.0 - 15.0 g/dL    HCT 16.1 (*) 09.6 - 46.0 %    MCV 87.5  78.0 - 100.0 fL    MCH 28.6  26.0 - 34.0 pg    MCHC 32.7  30.0 - 36.0 g/dL    RDW 04.5 (*) 40.9 - 15.5 %     Platelets 298  150 - 400 K/uL   COMPREHENSIVE METABOLIC PANEL     Status: Abnormal   Collection Time   02/18/12  3:56 PM      Component Value Range Comment   Sodium 132 (*) 135 - 145 mEq/L    Potassium 4.7  3.5 - 5.1 mEq/L    Chloride 96  96 - 112 mEq/L    CO2 24  19 - 32 mEq/L    Glucose, Bld 132 (*) 70 - 99 mg/dL    BUN 46 (*) 6 - 23 mg/dL    Creatinine, Ser 8.11 (*) 0.50 - 1.10 mg/dL    Calcium 9.9  8.4 - 91.4 mg/dL    Total Protein 7.8  6.0 - 8.3 g/dL    Albumin 4.0  3.5 - 5.2 g/dL    AST 20  0 - 37 U/L    ALT 13  0 - 35 U/L    Alkaline Phosphatase 74  39 - 117 U/L    Total Bilirubin 0.3  0.3 - 1.2 mg/dL    GFR calc non Af Amer 21 (*) >90 mL/min    GFR calc Af Amer 25 (*) >90 mL/min   PROTIME-INR     Status: Abnormal   Collection Time   02/18/12  3:56 PM      Component Value Range Comment   Prothrombin Time 20.3 (*) 11.6 - 15.2 seconds    INR 1.70 (*) 0.00 - 1.49   URINALYSIS, ROUTINE W REFLEX MICROSCOPIC     Status: Normal   Collection Time   02/18/12  5:30 PM      Component Value Range Comment   Color, Urine YELLOW  YELLOW    APPearance CLEAR  CLEAR    Specific Gravity, Urine 1.010  1.005 - 1.030    pH 7.5  5.0 - 8.0    Glucose, UA NEGATIVE  NEGATIVE mg/dL    Hgb urine dipstick NEGATIVE  NEGATIVE    Bilirubin Urine NEGATIVE  NEGATIVE    Ketones, ur NEGATIVE  NEGATIVE mg/dL    Protein, ur NEGATIVE  NEGATIVE mg/dL    Urobilinogen, UA 0.2  0.0 - 1.0 mg/dL    Nitrite NEGATIVE  NEGATIVE    Leukocytes, UA NEGATIVE  NEGATIVE MICROSCOPIC NOT DONE ON URINES WITH NEGATIVE PROTEIN, BLOOD, LEUKOCYTES, NITRITE, OR GLUCOSE <1000 mg/dL.  CARDIAC PANEL(CRET KIN+CKTOT+MB+TROPI)     Status: Normal   Collection Time   02/19/12 12:00 AM      Component Value Range Comment   Total CK 23  7 - 177 U/L  CK, MB 2.2  0.3 - 4.0 ng/mL    Troponin I <0.30  <0.30 ng/mL    Relative Index RELATIVE INDEX IS INVALID  0.0 - 2.5   D-DIMER, QUANTITATIVE     Status: Abnormal   Collection Time   02/19/12  12:00 AM      Component Value Range Comment   D-Dimer, Quant 1.77 (*) 0.00 - 0.48 ug/mL-FEU   CARDIAC PANEL(CRET KIN+CKTOT+MB+TROPI)     Status: Normal   Collection Time   02/19/12  6:48 AM      Component Value Range Comment   Total CK 22  7 - 177 U/L    CK, MB 2.1  0.3 - 4.0 ng/mL    Troponin I <0.30  <0.30 ng/mL    Relative Index RELATIVE INDEX IS INVALID  0.0 - 2.5   CBC     Status: Abnormal   Collection Time   02/19/12  6:48 AM      Component Value Range Comment   WBC 10.9 (*) 4.0 - 10.5 K/uL    RBC 3.63 (*) 3.87 - 5.11 MIL/uL    Hemoglobin 10.3 (*) 12.0 - 15.0 g/dL    HCT 16.1 (*) 09.6 - 46.0 %    MCV 89.5  78.0 - 100.0 fL    MCH 28.4  26.0 - 34.0 pg    MCHC 31.7  30.0 - 36.0 g/dL    RDW 04.5 (*) 40.9 - 15.5 %    Platelets 243  150 - 400 K/uL   COMPREHENSIVE METABOLIC PANEL     Status: Abnormal   Collection Time   02/19/12  6:48 AM      Component Value Range Comment   Sodium 136  135 - 145 mEq/L    Potassium 4.7  3.5 - 5.1 mEq/L    Chloride 103  96 - 112 mEq/L    CO2 25  19 - 32 mEq/L    Glucose, Bld 98  70 - 99 mg/dL    BUN 41 (*) 6 - 23 mg/dL    Creatinine, Ser 8.11 (*) 0.50 - 1.10 mg/dL    Calcium 9.2  8.4 - 91.4 mg/dL    Total Protein 6.8  6.0 - 8.3 g/dL    Albumin 3.4 (*) 3.5 - 5.2 g/dL    AST 17  0 - 37 U/L    ALT 12  0 - 35 U/L    Alkaline Phosphatase 63  39 - 117 U/L    Total Bilirubin 0.4  0.3 - 1.2 mg/dL    GFR calc non Af Amer 23 (*) >90 mL/min    GFR calc Af Amer 27 (*) >90 mL/min   PROTIME-INR     Status: Abnormal   Collection Time   02/19/12  6:48 AM      Component Value Range Comment   Prothrombin Time 22.4 (*) 11.6 - 15.2 seconds    INR 1.93 (*) 0.00 - 1.49   CARDIAC PANEL(CRET KIN+CKTOT+MB+TROPI)     Status: Normal   Collection Time   02/19/12  2:29 PM      Component Value Range Comment   Total CK 22  7 - 177 U/L    CK, MB 2.0  0.3 - 4.0 ng/mL    Troponin I <0.30  <0.30 ng/mL    Relative Index RELATIVE INDEX IS INVALID  0.0 - 2.5     Dg Thoracic  Spine 2 View  02/19/2012  *RADIOLOGY REPORT*  Clinical Data: Upper back pain secondary to a  fall.  Evaluate for fracture.  THORACIC SPINE - 2 VIEW  Comparison: None.  Findings: Skeletal osteopenia.  Mid thoracic disc space narrowing. No fracture or traumatic subluxation.  Dual lead pacer. Cardiomegaly.  IMPRESSION: No thoracic vertebral body fractures observed.  Original Report Authenticated By: Elsie Stain, M.D.   Dg Lumbar Spine 2-3 Views  02/19/2012  *RADIOLOGY REPORT*  Clinical Data: Back pain after fall  LUMBAR SPINE - 2-3 VIEW  Comparison: None.  Findings: Disc space narrowing L4-5 and L5-S1.  No osseous destructive lesion.  No acute compression fracture.  Vascular calcification with previous stents.  IMPRESSION: No acute findings.  Original Report Authenticated By: Elsie Stain, M.D.   Dg Wrist Complete Left  02/18/2012  *RADIOLOGY REPORT*  Clinical Data: Fall with pain.  LEFT WRIST - COMPLETE 3+ VIEW  Comparison: None.  Findings: Mild osteopenia.  Expected for age degenerative changes, including about the radial aspect of the carpus. No acute fracture or dislocation.  Scaphoid intact.  IMPRESSION:  No acute findings about the left wrist.  Original Report Authenticated By: Consuello Bossier, M.D.   Ct Head Wo Contrast  02/18/2012  *RADIOLOGY REPORT*  Clinical Data: Fall, trauma  CT HEAD WITHOUT CONTRAST  Technique:  Contiguous axial images were obtained from the base of the skull through the vertex without contrast.  Comparison: None.  Findings: Diffuse brain atrophy noted with periventricular white matter microvascular ischemic changes.  No acute intracranial hemorrhage, mass lesion, definite acute infarction, midline shift, herniation, hydrocephalus, or extra-axial fluid collection.  Gray- white matter differentiation maintained.  Cisterns patent. Cerebellar atrophy as well.  Mastoids and sinuses clear.  Orbits symmetric.  IMPRESSION: Atrophy and microvascular ischemic changes.  Original Report  Authenticated By: Judie Petit. Ruel Favors, M.D.   US Transvaginal Non-ob  02/19/2012  *RADIOLOGY REPORT*  Clinical Data: Vaginal bleeding.  Post menopausal female.  TRANSABDOMINAL AND TRANSVAGINAL ULTRASOUND OF PELVIS  Technique:  Both transabdominal and transvaginal ultrasound examinations of the pelvis were performed.  Transabdominal technique was performed for global imaging of the pelvis including uterus, ovaries, adnexal regions, and pelvic cul-de-sac.  It was necessary to proceed with endovaginal exam following the transabdominal exam to visualize the endometrium and adnexa.  Comparison:  None.  Findings:  A Foley catheter is seen within the urinary bladder, which is empty.  Uterus:  5.0 x 2.9 x 2.7 cm.  Retroverted.  Prominent vascular calcification noted within the myometrium.  No fibroids or other uterine masses are identified.  Endometrium: Tiny amount of fluid noted in the endometrial cavity. Double layer endometrial thickness excluding this fluid measures 4 mm.  No focal abnormality visualized.  Right ovary: not directly visualized by transabdominal or transvaginal sonography, however no adnexal mass identified.  Left ovary: not directly visualized by transabdominal or transvaginal sonography, however no adnexal mass identified.  Other Findings:  No other adnexal mass or free fluid identified.  IMPRESSION:  1.  No evidence of fibroids. Endometrial thickness measures 4 mm, which is within normal limits. If vaginal bleeding remains unresponsive to hormonal or medical therapy, sonohysterogram could be performed for focal lesion work-up. 2.  Nonvisualization of ovaries, however no adnexal mass identified.  Original Report Authenticated By: Danae Orleans, M.D.   US Pelvis Complete  02/19/2012  *RADIOLOGY REPORT*  Clinical Data: Vaginal bleeding.  Post menopausal female.  TRANSABDOMINAL AND TRANSVAGINAL ULTRASOUND OF PELVIS  Technique:  Both transabdominal and transvaginal ultrasound examinations of the pelvis  were performed.  Transabdominal technique was performed for global  imaging of the pelvis including uterus, ovaries, adnexal regions, and pelvic cul-de-sac.  It was necessary to proceed with endovaginal exam following the transabdominal exam to visualize the endometrium and adnexa.  Comparison:  None.  Findings:  A Foley catheter is seen within the urinary bladder, which is empty.  Uterus:  5.0 x 2.9 x 2.7 cm.  Retroverted.  Prominent vascular calcification noted within the myometrium.  No fibroids or other uterine masses are identified.  Endometrium: Tiny amount of fluid noted in the endometrial cavity. Double layer endometrial thickness excluding this fluid measures 4 mm.  No focal abnormality visualized.  Right ovary: not directly visualized by transabdominal or transvaginal sonography, however no adnexal mass identified.  Left ovary: not directly visualized by transabdominal or transvaginal sonography, however no adnexal mass identified.  Other Findings:  No other adnexal mass or free fluid identified.  IMPRESSION:  1.  No evidence of fibroids. Endometrial thickness measures 4 mm, which is within normal limits. If vaginal bleeding remains unresponsive to hormonal or medical therapy, sonohysterogram could be performed for focal lesion work-up. 2.  Nonvisualization of ovaries, however no adnexal mass identified.  Original Report Authenticated By: Danae Orleans, M.D.   Dg Chest Port 1 View  02/18/2012  *RADIOLOGY REPORT*  Clinical Data: Syncope  PORTABLE CHEST - 1 VIEW  Comparison: Chest x-ray of 10/03/2011  Findings: Moderate cardiomegaly is present.  No focal infiltrate or effusion is seen.  A permanent pacemaker remains.  Median sternotomy sutures are noted.  There are significant degenerative changes in both shoulders.  IMPRESSION: Stable cardiomegaly with permanent pacemaker.  No active lung disease.  Original Report Authenticated By: Juline Patch, M.D.   Dg Shoulder Left  02/18/2012  *RADIOLOGY REPORT*   Clinical Data: Left humerus fracture secondary to a fall.  LEFT SHOULDER - 2+ VIEW  Comparison: Radiographs dated 09/12/2009 and humerus images dated 02/18/2012  Findings: There is a comminuted slightly impacted fracture of the proximal left humeral shaft without angulation.  The patient has severe osteoarthritis of the glenohumeral joint with slight posterior subluxation of the humeral head but there is no dislocation.  No other acute abnormality.  IMPRESSION: Fracture of the proximal humerus as described.  Severe osteoarthritis of the glenohumeral joint.  Original Report Authenticated By: Gwynn Burly, M.D.   Dg Knee Complete 4 Views Left  02/18/2012  *RADIOLOGY REPORT*  Clinical Data: Recent fall, pain  LEFT KNEE - COMPLETE 4+ VIEW  Comparison: None.  Findings: No acute fracture is seen.  Mild degenerative joint disease is noted.  No effusion is seen.  Surgical clips overlie the medial left knee.  IMPRESSION: No fracture.  No effusion.  Original Report Authenticated By: Juline Patch, M.D.   Dg Humerus Left  02/18/2012  *RADIOLOGY REPORT*  Clinical Data: Fall today with pain.  LEFT HUMERUS - 2+ VIEW  Comparison: 09/12/2009 shoulder films.  Findings: Incompletely imaged left-sided pacer.  Underlying glenohumeral joint osteoarthritis.  Likely comminuted proximal humerus fracture, suboptimally evaluated.  No dislocation identified.  Underlying moderate osteopenia.  IMPRESSION: Proximal humerus fracture, likely comminuted.  Recommend further evaluation with dedicated shoulder films.  Original Report Authenticated By: Consuello Bossier, M.D.    Review of Systems  Musculoskeletal: Positive for joint pain (History of knee pain and shoulder pain secondary to DJD for some time.).   Blood pressure 144/81, pulse 69, temperature 97.4 F (36.3 C), temperature source Oral, resp. rate 18, height 5\' 4"  (1.626 m), weight 53.8 kg (118 lb 9.7 oz),  SpO2 97.00%. Physical Exam  Constitutional: She is oriented to person,  place, and time. She appears well-developed and well-nourished.  HENT:  Head: Normocephalic and atraumatic.  Eyes: Conjunctivae and EOM are normal. Pupils are equal, round, and reactive to light.  Neck: Neck supple.  Cardiovascular: Normal rate and regular rhythm.   Respiratory: Effort normal.  GI: Soft.  Musculoskeletal: She exhibits tenderness (Pain left proximal humerus with ecchymosis).       Arms: Neurological: She is alert and oriented to person, place, and time. She has normal reflexes.  Skin: Skin is warm and dry.  Psychiatric: She has a normal mood and affect. Her behavior is normal. Judgment and thought content normal.    Assessment/Plan: Fracture of the left proximal humerus.  Continue shoulder immobilizer and sleep semi-erect.  I can see in office a week after discharge.  Ashvin Adelson 02/19/2012, 5:40 PM

## 2012-02-19 NOTE — Progress Notes (Signed)
ANTICOAGULATION CONSULT NOTE  Pharmacy Consult for Warfarin Indication: atrial fibrillation  Allergies  Allergen Reactions  . Ramipril Hives  . Contrast Media (Iodinated Diagnostic Agents) Other (See Comments)    Reaction noted by md  . Heparin Other (See Comments)    Clots blood instead of thinning  . Hydromorphone Hcl Other (See Comments)    Crazy feeling  . Ramipril Hives    Patient Measurements: Height: 5\' 4"  (162.6 cm) Weight: 118 lb 9.7 oz (53.8 kg) IBW/kg (Calculated) : 54.7   Vital Signs: Temp: 97.3 F (36.3 C) (07/09 0636) Temp src: Oral (07/09 0636) BP: 138/76 mmHg (07/09 0636) Pulse Rate: 71  (07/09 0636)  Labs:  Alvira Philips 02/19/12 0648 02/19/12 02/18/12 1556  HGB 10.3* -- 11.7*  HCT 32.5* -- 35.8*  PLT 243 -- 298  APTT -- -- --  LABPROT 22.4* -- 20.3*  INR 1.93* -- 1.70*  HEPARINUNFRC -- -- --  CREATININE 1.88* -- 2.02*  CKTOTAL 22 23 --  CKMB 2.1 2.2 --  TROPONINI <0.30 <0.30 --    Estimated Creatinine Clearance: 18.9 ml/min (by C-G formula based on Cr of 1.88).   Medical History: Past Medical History  Diagnosis Date  . Coronary artery disease     status post LAD stenting in 1999 after NSTEMI; CABG 2004; Last LHC 7/11: Proximal LAD 80%, mid 95%, distal 40%, mid R. High 40%, proximal RCA 40%, LIMA-LAD patent, SVG-PDA patent, EF 65%.   . Hypertension   . Hyperlipidemia   . Carotid artery disease     status post right carotid endarterectomy.  . Degenerative joint disease   . Restless leg syndrome   . Mild renal insufficiency   . Heparin induced thrombocytopenia   . Diphtheria     "as a child"  . Pleural effusion, bilateral 06/2003  . Pseudoaneurysm     status post repair following catherization in 1999  . Chronic systolic heart failure 10/02/11  . Ischemic cardiomyopathy     Echocardiogram 10/01/11: Mild LVH, EF 35-40%, basal inferior, basal to mid posterior and basal to mid anterolateral severe hypokinesis, mild to moderate AI, moderate MR  (ischemic MR), moderate LAE, mild RVE, mildly reduced RV systolic function, mild RAE, PASP 43-44  . COPD (chronic obstructive pulmonary disease)   . Atrial fibrillation/flutter     s/p multiple DCCVs;  amiodarone started 09/2011, TEE DCCV 3/13  . Tachycardia-bradycardia syndrome     s/p Medtronic pacemaker implant 09/2011 (8 sec pause noted)  . GERD (gastroesophageal reflux disease)   . Renal artery stenosis     bilateral- status post stenting  . Non-functioning kidney   . Depression     "cries alot"  . Gout     Medications:  Scheduled:     . amiodarone  200 mg Oral QHS  . atorvastatin  10 mg Oral q1800  . carvedilol  12.5 mg Oral BID WC  . furosemide  40 mg Oral QHS  .  morphine injection  4 mg Intravenous Once  .  morphine injection  4 mg Intravenous Once  . ondansetron (ZOFRAN) IV  4 mg Intravenous Once  . ondansetron (ZOFRAN) IV  4 mg Intravenous Once  . pantoprazole  40 mg Oral Q1200  . potassium chloride SA  20 mEq Oral QPM  . predniSONE  10 mg Oral Once  . predniSONE  20 mg Oral Once  . sodium chloride  500 mL Intravenous Once  . sodium chloride  500 mL Intravenous Once  . spironolactone  12.5  mg Oral QHS  . warfarin  2.5 mg Oral Once  . Warfarin - Pharmacist Dosing Inpatient   Does not apply q1800  . DISCONTD: sodium chloride   Intravenous STAT    Assessment: Okay for Protocol Recent fall, vaginal bleeding, left femur fracture. Continue Warfarin for now per IM (cardiology to help determine risk/benefit).  Hg okay and INR below goal but rising. On Amiodarone which can elevate INR (home med).  Goal of Therapy:  INR 2-3   Plan:  Warfarin 2.5mg  PO x 1. Daily INR.  Margo Aye, Angeleena Dueitt A 02/19/2012,8:51 AM

## 2012-02-19 NOTE — Consult Note (Signed)
CARDIOLOGY CONSULT NOTE    Patient ID: [name] [name] MRN: [id number] DOB/AGE: [birth year] 78 y.o.  Admit date: 02/18/2012 Referring Physician:  Karilyn Cota Primary Physician: Josue Hector, MD Primary Cardiologist: Hochrein/Allred Reason for Consultation: ? Syncope  Principal Problem:  *Syncope and collapse Active Problems:  MITRAL REGURGITATION  HYPERTENSION  CAD  Atrial fibrillation  Left humeral fracture  Chronic systolic CHF (congestive heart failure)  Cardiac pacemaker in situ  ARF (acute renal failure)  Vaginal bleeding  Gout   HPI:    Complicated frail elderly female currently at rehab She has a history of CAD, status post stenting to the LAD in 1999, status post CABG in 2004, hypertension, hyperlipidemia, atrial fibrillation/flutter, status post multiple cardioversions in the past, ischemic cardiomyopathy. I did her last TEE/DCC on 3/13.  EF 35-40% with moderate MR   She has a pacer that is functioning normally last interogated 6/13 by Dr Johney Frame.  Placed for long pauses.   Last LHC 7/11: Proximal LAD 80%, mid 95%, distal 40%, mid R. High 40%, proximal RCA 40%, LIMA-LAD patent, SVG-PDA patent, EF 65%  Seems to be falling all of the time.  Not clearly syncope.  No palpitatoins.  Some chest pain last night.  Not clear to be wether she is falling from being weak or stumbling.  She is intolerant to amiodarone.  Lasix makes her weaker.  She is currently in radiology getting V/Q scan.    She noted vaginal bleeding last week and needed a tampon placed??  Having [place] of pelvis today   All other systems reviewed and negative except as noted above  Past Medical History  Diagnosis Date  . Coronary artery disease     status post LAD stenting in 1999 after NSTEMI; CABG 2004; Last LHC 7/11: Proximal LAD 80%, mid 95%, distal 40%, mid R. High 40%, proximal RCA 40%, LIMA-LAD patent, SVG-PDA patent, EF 65%.   . Hypertension   . Hyperlipidemia   . Carotid artery disease     status  post right carotid endarterectomy.  . Degenerative joint disease   . Restless leg syndrome   . Mild renal insufficiency   . Heparin induced thrombocytopenia   . Diphtheria     "as a child"  . Pleural effusion, bilateral 06/2003  . Pseudoaneurysm     status post repair following catherization in 1999  . Chronic systolic heart failure 10/02/11  . Ischemic cardiomyopathy     Echocardiogram 10/01/11: Mild LVH, EF 35-40%, basal inferior, basal to mid posterior and basal to mid anterolateral severe hypokinesis, mild to moderate AI, moderate MR (ischemic MR), moderate LAE, mild RVE, mildly reduced RV systolic function, mild RAE, PASP 43-44  . COPD (chronic obstructive pulmonary disease)   . Atrial fibrillation/flutter     s/p multiple DCCVs;  amiodarone started 09/2011, TEE DCCV 3/13  . Tachycardia-bradycardia syndrome     s/p Medtronic pacemaker implant 09/2011 (8 sec pause noted)  . GERD (gastroesophageal reflux disease)   . Renal artery stenosis     bilateral- status post stenting  . Non-functioning kidney   . Depression     "cries alot"  . Gout     Family History  Problem Relation Age of Onset  . Cancer Mother 45    died  . Lung disease Father 24    died  . Heart disease Sister     2 sisters and her son  . Anesthesia problems Neg Hx     History   Social History  . Marital  Status: Widowed    Spouse Name: N/A    Number of Children: N/A  . Years of Education: N/A   Occupational History  . retired Health and safety inspector    Social History Main Topics  . Smoking status: Never Smoker   . Smokeless tobacco: Never Used  . Alcohol Use: No  . Drug Use: No  . Sexually Active: No   Other Topics Concern  . Not on file   Social History Narrative  . No narrative on file    Past Surgical History  Procedure Date  . Carotid endarterectomy 05/2003    right  . Pacemaker insertion 10/02/11    implanted by Dr Johney Frame  . Cholecystectomy ?02/2011  . Tonsillectomy and adenoidectomy   .  Renal artery stent 06/2003    bilaterally/e-chart  . Thoracentesis ? 2000; 05/2011  . Coronary angioplasty with stent placement 1999  . Av fistula repair 01/1998    w/pseudoaneurysm repair S/P catheterization/E-chart  . Coronary artery bypass graft 05/2003    CABG X 3  . Tee without cardioversion 10/23/2011    Procedure: TRANSESOPHAGEAL ECHOCARDIOGRAM (TEE);  Surgeon: Wendall Stade, MD;  Location: [facility] ENDOSCOPY;  Service: Cardiovascular;  Laterality: N/A;  . Cardioversion 10/23/2011    Procedure: CARDIOVERSION;  Surgeon: Wendall Stade, MD;  Location: [facility] ENDOSCOPY;  Service: Cardiovascular;  Laterality: N/A;        . amiodarone  200 mg Oral QHS  . atorvastatin  10 mg Oral q1800  . carvedilol  12.5 mg Oral BID WC  . furosemide  40 mg Oral QHS  .  morphine injection  4 mg Intravenous Once  .  morphine injection  4 mg Intravenous Once  . ondansetron (ZOFRAN) IV  4 mg Intravenous Once  . ondansetron (ZOFRAN) IV  4 mg Intravenous Once  . pantoprazole  40 mg Oral Q1200  . potassium chloride SA  20 mEq Oral QPM  . predniSONE  10 mg Oral Once  . predniSONE  20 mg Oral Once  . sodium chloride  500 mL Intravenous Once  . sodium chloride  500 mL Intravenous Once  . spironolactone  12.5 mg Oral QHS  . warfarin  2.5 mg Oral Once  . warfarin  2.5 mg Oral ONCE-1800  . Warfarin - Pharmacist Dosing Inpatient   Does not apply q1800  . DISCONTD: sodium chloride   Intravenous STAT      . sodium chloride 75 mL/hr at 02/18/12 2326    Physical Exam: Blood pressure 138/76, pulse 71, temperature 97.3 F (36.3 C), temperature source Oral, resp. rate 20, height 5\' 4"  (1.626 m), weight 53.8 kg (118 lb 9.7 oz), SpO2 96.00%.   Affect appropriate Frail elderly female HEENT: normal Neck supple with no adenopathy JVP normal no bruits no thyromegaly Lungs clear with no wheezing and good diaphragmatic motion Heart:  S1/S2 MR murmur, no rub, gallop or click PMI normal Abdomen: benighn, BS positve, no  tenderness, no AAA no bruit.  No HSM or HJR Distal pulses intact with no bruits No edema Neuro non-focal Skin warm and dry No muscular weakness Pacer under left clavicle   Labs:   Lab Results  Component Value Date   WBC 10.9* 02/19/2012   HGB 10.3* 02/19/2012   HCT 32.5* 02/19/2012   MCV 89.5 02/19/2012   PLT 243 02/19/2012    Lab 02/19/12 0648  NA 136  K 4.7  CL 103  CO2 25  BUN 41*  CREATININE 1.88*  CALCIUM 9.2  PROT 6.8  BILITOT 0.4  ALKPHOS 63  ALT 12  AST 17  GLUCOSE 98   Lab Results  Component Value Date   CKTOTAL 22 02/19/2012   CKMB 2.1 02/19/2012   TROPONINI <0.30 02/19/2012    Lab Results  Component Value Date   CHOL 82 10/01/2011   CHOL  Value: 123        ATP III CLASSIFICATION:  <200     mg/dL   Desirable  644-034  mg/dL   Borderline High  >=742    mg/dL   High        5/95/6387   CHOL  Value: 118        ATP III CLASSIFICATION:  <200     mg/dL   Desirable  564-332  mg/dL   Borderline High  >=951    mg/dL   High        8/84/1660   Lab Results  Component Value Date   HDL 26* 10/01/2011   HDL 41 03/03/2010   HDL 19* 09/23/2009   Lab Results  Component Value Date   LDLCALC 38 10/01/2011   LDLCALC  Value: 70        Total Cholesterol/HDL:CHD Risk Coronary Heart Disease Risk Table                     Men   Women  1/2 Average Risk   3.4   3.3  Average Risk       5.0   4.4  2 X Average Risk   9.6   7.1  3 X Average Risk  23.4   11.0        Use the calculated Patient Ratio above and the CHD Risk Table to determine the patient's CHD Risk.        ATP III CLASSIFICATION (LDL):  <100     mg/dL   Optimal  630-160  mg/dL   Near or Above                    Optimal  130-159  mg/dL   Borderline  109-323  mg/dL   High  >557     mg/dL   Very High 11/01/252   LDLCALC  Value: 83        Total Cholesterol/HDL:CHD Risk Coronary Heart Disease Risk Table                     Men   Women  1/2 Average Risk   3.4   3.3  Average Risk       5.0   4.4  2 X Average Risk   9.6   7.1  3 X Average Risk   23.4   11.0        Use the calculated Patient Ratio above and the CHD Risk Table to determine the patient's CHD Risk.        ATP III CLASSIFICATION (LDL):  <100     mg/dL   Optimal  270-623  mg/dL   Near or Above                    Optimal  130-159  mg/dL   Borderline  762-831  mg/dL   High  >517     mg/dL   Very High 01/27/736   Lab Results  Component Value Date   TRIG 88 10/01/2011   TRIG 61 03/03/2010   TRIG 79 09/23/2009   Lab Results  Component Value Date  CHOLHDL 3.2 10/01/2011   CHOLHDL 3.0 03/03/2010   CHOLHDL 6.2 09/23/2009   No results found for this basename: LDLDIRECT      Radiology: Dg Wrist Complete Left  02/18/2012  *RADIOLOGY REPORT*  Clinical Data: Fall with pain.  LEFT WRIST - COMPLETE 3+ VIEW  Comparison: None.  Findings: Mild osteopenia.  Expected for age degenerative changes, including about the radial aspect of the carpus. No acute fracture or dislocation.  Scaphoid intact.  IMPRESSION:  No acute findings about the left wrist.  Original Report Authenticated By: Consuello Bossier, M.D.   Ct Head Wo Contrast  02/18/2012  *RADIOLOGY REPORT*  Clinical Data: Fall, trauma  CT HEAD WITHOUT CONTRAST  Technique:  Contiguous axial images were obtained from the base of the skull through the vertex without contrast.  Comparison: None.  Findings: Diffuse brain atrophy noted with periventricular white matter microvascular ischemic changes.  No acute intracranial hemorrhage, mass lesion, definite acute infarction, midline shift, herniation, hydrocephalus, or extra-axial fluid collection.  Gray- white matter differentiation maintained.  Cisterns patent. Cerebellar atrophy as well.  Mastoids and sinuses clear.  Orbits symmetric.  IMPRESSION: Atrophy and microvascular ischemic changes.  Original Report Authenticated By: Judie Petit. Ruel Favors, M.D.   Dg Chest Port 1 View  02/18/2012  *RADIOLOGY REPORT*  Clinical Data: Syncope  PORTABLE CHEST - 1 VIEW  Comparison: Chest x-ray of 10/03/2011  Findings:  Moderate cardiomegaly is present.  No focal infiltrate or effusion is seen.  A permanent pacemaker remains.  Median sternotomy sutures are noted.  There are significant degenerative changes in both shoulders.  IMPRESSION: Stable cardiomegaly with permanent pacemaker.  No active lung disease.  Original Report Authenticated By: Juline Patch, M.D.   Dg Shoulder Left  02/18/2012  *RADIOLOGY REPORT*  Clinical Data: Left humerus fracture secondary to a fall.  LEFT SHOULDER - 2+ VIEW  Comparison: Radiographs dated 09/12/2009 and humerus images dated 02/18/2012  Findings: There is a comminuted slightly impacted fracture of the proximal left humeral shaft without angulation.  The patient has severe osteoarthritis of the glenohumeral joint with slight posterior subluxation of the humeral head but there is no dislocation.  No other acute abnormality.  IMPRESSION: Fracture of the proximal humerus as described.  Severe osteoarthritis of the glenohumeral joint.  Original Report Authenticated By: Gwynn Burly, M.D.   Dg Knee Complete 4 Views Left  02/18/2012  *RADIOLOGY REPORT*  Clinical Data: Recent fall, pain  LEFT KNEE - COMPLETE 4+ VIEW  Comparison: None.  Findings: No acute fracture is seen.  Mild degenerative joint disease is noted.  No effusion is seen.  Surgical clips overlie the medial left knee.  IMPRESSION: No fracture.  No effusion.  Original Report Authenticated By: Juline Patch, M.D.   Dg Humerus Left  02/18/2012  *RADIOLOGY REPORT*  Clinical Data: Fall today with pain.  LEFT HUMERUS - 2+ VIEW  Comparison: 09/12/2009 shoulder films.  Findings: Incompletely imaged left-sided pacer.  Underlying glenohumeral joint osteoarthritis.  Likely comminuted proximal humerus fracture, suboptimally evaluated.  No dislocation identified.  Underlying moderate osteopenia.  IMPRESSION: Proximal humerus fracture, likely comminuted.  Recommend further evaluation with dedicated shoulder films.  Original Report Authenticated  By: Consuello Bossier, M.D.    EKG:  AV pacing no actue changes   ASSESSMENT AND PLAN:  Falling:  Not clear cardiac etiology  Getting V/Q scan.  D/C amiodarone  Pacer shows NSR with no mode switches since [facility] in March.  D/C Aldactone with prerenal azotemia and check  postural vital signs.   CAD:  Stable R/O would not be aggressive in w/u give age and frailty CHF:  If anything on dry side.  D/C aldactone   Pacer:  Normal function  Signed: Charlton Haws 02/19/2012, 9:29 AM

## 2012-02-19 NOTE — Evaluation (Signed)
Occupational Therapy Evaluation Patient Details Name: [name] [name] MRN: [id number] DOB: [birth year] Today's Date: 02/19/2012 Time: 4098-1191 OT Time Calculation (min): 24 min  OT Assessment / Plan / Recommendation Clinical Impression  Patient is a 78 y/o s/p syncope, collapse, and possible left proximal humerus fx presenting to acute OT with deficits below. Patient will benefit from OT services to increase ADL performance and mobility to allow for a safe return home. patient will benefit from a SNF to receive continued therapy before returning home.    OT Assessment  Patient needs continued OT Services    Follow Up Recommendations  Skilled nursing facility       Equipment Recommendations  Defer to next venue       Frequency  Min 2X/week    Precautions / Restrictions Precautions Precaution Comments: Left shoulder immobilizer on at all times until further doctor orders. Required Braces or Orthoses: Other Brace/Splint Other Brace/Splint: left shoulder immobilizer.   Pertinent Vitals/Pain 9/10 in left shoulder. Nursing notified and pain meds received.   ADL  Grooming: Simulated;Supervision/safety Where Assessed - Grooming: Supported sitting Upper Body Bathing: Simulated;Moderate assistance Where Assessed - Upper Body Bathing: Supported sitting Lower Body Bathing: Simulated;+1 Total assistance Where Assessed - Lower Body Bathing: Supported sit to stand Upper Body Dressing: Performed;Maximal assistance Where Assessed - Upper Body Dressing: Supported sitting Lower Body Dressing: Simulated;+1 Total assistance Where Assessed - Lower Body Dressing: Supported sit to Pharmacist, hospital: Performed;Minimal Dentist Method: Sit to Barista:  (to x-ray tech w/c) Equipment Used: Gait belt Transfers/Ambulation Related to ADLs: patient transfers at a [employer] level with hand held assist. patient required increased time and several attempts with  vc's for motivation secondary to pain level in left shoulder and shoulder blade when attempting to stand.    OT Diagnosis: Generalized weakness;Acute pain  OT Problem List: Decreased strength;Decreased range of motion;Decreased activity tolerance;Impaired balance (sitting and/or standing);Pain;Decreased knowledge of use of DME or AE;Decreased safety awareness;Impaired UE functional use OT Treatment Interventions: Self-care/ADL training;Therapeutic exercise;Energy conservation;DME and/or AE instruction;Manual therapy;Therapeutic activities;Balance training;Patient/family education   OT Goals Acute Rehab OT Goals OT Goal Formulation: With patient Time For Goal Achievement: 03/04/12 Potential to Achieve Goals: Good ADL Goals Pt Will Perform Upper Body Bathing: with min assist;Sitting, edge of bed ADL Goal: Upper Body Bathing - Progress: Goal set today Pt Will Perform Lower Body Bathing: with mod assist;Sit to stand from bed ADL Goal: Lower Body Bathing - Progress: Goal set today Pt Will Perform Upper Body Dressing: with min assist;Sitting, bed ADL Goal: Upper Body Dressing - Progress: Goal set today Pt Will Perform Lower Body Dressing: with mod assist;Sit to stand from bed ADL Goal: Lower Body Dressing - Progress: Goal set today Pt Will Transfer to Toilet: with supervision;Regular height toilet;Ambulation;Grab bars ADL Goal: Toilet Transfer - Progress: Goal set today Arm Goals Additional Arm Goal #1: Patient will increase Bil UE strength and endurance in order to participate in bathing and dressing tasks. Arm Goal: Additional Goal #1 - Progress: Goal set today Miscellaneous OT Goals Miscellaneous OT Goal #1: Patient will decrease pain level in left shoulder from a 9/10 to a 4/10 to be able to perform UB dressing tasks. OT Goal: Miscellaneous Goal #1 - Progress: Goal set today  Visit Information  Last OT Received On: 02/19/12 Assistance Needed: +1    Subjective Data  Subjective: "I'm  afraid of falling." Patient Stated Goal: To go home.   Prior Functioning  Home Living Lives  With: Daughter (move in between two daughters.) Available Help at Discharge: Family;Available 24 hours/day Type of Home: House Home Access: Level entry Entrance Stairs-Rails: None Home Layout: One level Bathroom Shower/Tub: Walk-in shower;Door (built in seat) Firefighter: Standard Home Adaptive Equipment: Dan Humphreys - four wheeled;Straight cane;Grab bars in shower Prior Function Level of Independence: Needs assistance Needs Assistance: Bathing;Dressing;Toileting Bath: Moderate Dressing: Moderate Toileting: Maximal (clothing management help) Able to Take Stairs?: Yes (with assist) Driving: No Vocation: Retired Musician: No difficulties Dominant Hand: Right    Cognition  Overall Cognitive Status: Appears within functional limits for tasks assessed/performed Arousal/Alertness: Awake/alert Orientation Level: Appears intact for tasks assessed Behavior During Session: [facility] for tasks performed    Extremity/Trunk Assessment Right Upper Extremity Assessment RUE ROM/Strength/Tone: Deficits RUE ROM/Strength/Tone Deficits: MMT: 3-/5 Left Upper Extremity Assessment LUE ROM/Strength/Tone: Unable to fully assess (Possible humeral fx. Unable to assess ROM and strength.)   Mobility Bed Mobility Bed Mobility: Sitting - Scoot to Edge of Bed;Supine to Sit Supine to Sit: HOB elevated;4: Min assist;With rails Sitting - Scoot to Edge of Bed: 3: Mod assist Transfers Transfers: Sit to Stand;Stand to Sit Sit to Stand: 4: Min assist;From bed;With upper extremity assist Stand to Sit: 4: Min assist;With armrests;Other (comment);Without upper extremity assist (to w/c)         End of Session OT - End of Session Equipment Utilized During Treatment: Gait belt Activity Tolerance: Patient limited by pain Patient left: Other (comment) (taken to x-ray) Nurse Communication: Precautions;Patient  requests pain meds    Jashad Depaula, Charisse March, OTR/L 02/19/2012, 3:46 PM

## 2012-02-19 NOTE — Clinical Social Work Psychosocial (Signed)
Clinical Social Work Department BRIEF PSYCHOSOCIAL ASSESSMENT 02/19/2012  Patient:  [name] [name], [name] [name]     Account Number:  [id number]     Admit date:  02/18/2012  Clinical Social Worker:  Nancie Neas  Date/Time:  02/19/2012 12:55 PM  Referred by:  Physician  Date Referred:  02/19/2012 Referred for  SNF Placement   Other Referral:   Interview type:  Patient Other interview type:   and daughter- [name] [name]    PSYCHOSOCIAL DATA Living Status:  FAMILY Admitted from facility:   Level of care:   Primary support name:  [name] [name] Primary support relationship to patient:  CHILD, ADULT Degree of support available:   very supportive per pt    CURRENT CONCERNS Current Concerns  Post-Acute Placement   Other Concerns:    SOCIAL WORK ASSESSMENT / PLAN CSW met with pt and pt's daughter [name] [name] at bedside following PT eval recommending SNF. Pt alert and oriented and reports she lives with her daughters, rotating weeks in their homes.  Pt generally uses walker, but has had several syncopal episodes, causing her to fall.  She is in a lot of pain currently.  Pt reports family are involved and supportive.  She has been to SNF in past at [facility] and would like to return there if possible.  CSW discussed placement process and will follow up with bed offers when available.   Assessment/plan status:  Psychosocial Support/Ongoing Assessment of Needs Other assessment/ plan:   Information/referral to community resources:   SNF list    PATIENT'S/FAMILY'S RESPONSE TO PLAN OF CARE: Pt and daughter feel ST SNF for rehab is necessary at d/c prior to returning home.  No further needs reported at this time.        Derenda Fennel, [place] [phone]

## 2012-02-19 NOTE — Evaluation (Signed)
Physical Therapy Evaluation Patient Details Name: [name] [name] MRN: [id number] DOB: 1924-12-20 Today's Date: 02/19/2012 Time: 4098-1191 PT Time Calculation (min): 44 min  PT Assessment / Plan / Recommendation Clinical Impression  Pt is extremely frail and deconditioned, multiple falls at home due to syncope.  Now has a L humeral fx.  She has 10/10 pain reported in LUE and with no sling available is unable to mobilize in the bed.  Orthopedist is to evaluate her today and I anticipate that a sling will be ordered.  I am recommending SNF at d/c due to her pain and severe deconditioning.    PT Assessment  Patient needs continued PT services    Follow Up Recommendations  Skilled nursing facility    Barriers to Discharge None      Equipment Recommendations  Defer to next venue    Recommendations for Other Services     Frequency Min 3X/week    Precautions / Restrictions Precautions Precautions: Fall Restrictions Weight Bearing Restrictions: No   Pertinent Vitals/Pain       Mobility  Bed Mobility Bed Mobility: Not assessed Details for Bed Mobility Assistance: pt has no sling on LUE, so was not mobilized OOB...to have Orthopedic consult today Transfers Transfers: Not assessed    Exercises     PT Diagnosis: Difficulty walking;Generalized weakness;Acute pain  PT Problem List: Decreased strength;Decreased activity tolerance;Decreased balance;Decreased mobility;Decreased safety awareness;Cardiopulmonary status limiting activity;Pain PT Treatment Interventions: Gait training;Functional mobility training;Therapeutic activities;Therapeutic exercise;Patient/family education   PT Goals Acute Rehab PT Goals PT Goal Formulation: With patient/family Time For Goal Achievement: 03/04/12 Potential to Achieve Goals: Fair Pt will go Supine/Side to Sit: with max assist;with HOB 0 degrees PT Goal: Supine/Side to Sit - Progress: Goal set today Pt will go Sit to Supine/Side: with HOB not 0  degrees (comment degree);with max assist PT Goal: Sit to Supine/Side - Progress: Goal set today Pt will go Sit to Stand: with max assist;with upper extremity assist PT Goal: Sit to Stand - Progress: Goal set today Pt will go Stand to Sit: with mod assist PT Goal: Stand to Sit - Progress: Goal set today Pt will Transfer Bed to Chair/Chair to Bed: with max assist PT Transfer Goal: Bed to Chair/Chair to Bed - Progress: Goal set today Pt will Ambulate: 1 - 15 feet;with max assist;with cane PT Goal: Ambulate - Progress: Goal set today  Visit Information  Last PT Received On: 02/19/12    Subjective Data  Subjective: I'm so weak Patient Stated Goal: none stated   Prior Functioning  Home Living Lives With: Daughter Available Help at Discharge: Family Type of Home: House Home Access: Stairs to enter Secretary/administrator of Steps: 3 Entrance Stairs-Rails: None Home Layout: One level Home Adaptive Equipment: Straight cane;Walker - four wheeled Prior Function Level of Independence: Needs assistance Able to Take Stairs?: Yes (with assist) Driving: No Vocation: Retired Musician: No difficulties    Cognition  Overall Cognitive Status: Appears within functional limits for tasks assessed/performed Arousal/Alertness: Awake/alert Orientation Level: Appears intact for tasks assessed Behavior During Session: [facility] for tasks performed    Extremity/Trunk Assessment Right Upper Extremity Assessment RUE ROM/Strength/Tone: Deficits RUE ROM/Strength/Tone Deficits: strength generally 3-/5 Left Upper Extremity Assessment LUE ROM/Strength/Tone:  (not tested due to humeral fx) Right Lower Extremity Assessment RLE ROM/Strength/Tone: Deficits RLE ROM/Strength/Tone Deficits: 2/5 Left Lower Extremity Assessment LLE ROM/Strength/Tone: Deficits LLE ROM/Strength/Tone Deficits: strength generally 2/5 Trunk Assessment Trunk Assessment:  (not able to assess)   Balance    End of  Session PT - End of Session Activity Tolerance: Patient limited by pain Patient left: in bed;with bed alarm set;with family/visitor present Nurse Communication: Mobility status  GP     Konrad Penta 02/19/2012, 11:43 AM

## 2012-02-19 NOTE — Progress Notes (Signed)
Left shoulder immobilizer placed on patient,some discomfort noted,patient medicated for pain.

## 2012-02-19 NOTE — Clinical Social Work Placement (Signed)
Clinical Social Work Department CLINICAL SOCIAL WORK PLACEMENT NOTE 02/19/2012  Patient:  [name] [name], [name] [name]  Account Number:  [id number] Admit date:  02/18/2012  Clinical Social Worker:  Derenda Fennel, LCSW  Date/time:  02/19/2012 12:35 PM  Clinical Social Work is seeking post-discharge placement for this patient at the following level of care:   SKILLED NURSING   (*CSW will update this form in Epic as items are completed)   02/19/2012  Patient/family provided with [facility] Department of Clinical Social Work's list of facilities offering this level of care within the geographic area requested by the patient (or if unable, by the patient's family).  02/19/2012  Patient/family informed of their freedom to choose among providers that offer the needed level of care, that participate in Medicare, Medicaid or managed care program needed by the patient, have an available bed and are willing to accept the patient.  02/19/2012  Patient/family informed of [facility]' ownership interest in [facility], as well as of the fact that they are under no obligation to receive care at this facility.  PASARR submitted to EDS on  PASARR number received from EDS on   FL2 transmitted to all facilities in geographic area requested by pt/family on  02/19/2012 FL2 transmitted to all facilities within larger geographic area on 02/19/2012  Patient informed that his/her managed care company has contracts with or will negotiate with  certain facilities, including the following:   n/a     Patient/family informed of bed offers received:   Patient chooses bed at  Physician recommends and patient chooses bed at    Patient to be transferred to  on   Patient to be transferred to facility by   The following physician request were entered in Epic:   Additional Comments: pt has previous pasarr number  Derenda Fennel, LCSW [phone]

## 2012-02-19 NOTE — Progress Notes (Addendum)
Subjective: This lady presented to the hospital with syncopal events. The etiology is not entirely clear. Cardiology has seen her and does not feel this is a cardiac problem. She appears to be dehydrated and it is possible that these episodes are related to hypovolemia. She is also fracture of the proximal left humerus. Orthopedics has been consulted. She complains of upper back pain.           Physical Exam: Blood pressure 138/76, pulse 71, temperature 97.3 F (36.3 C), temperature source Oral, resp. rate 20, height 5\' 4"  (1.626 m), weight 53.8 kg (118 lb 9.7 oz), SpO2 97.00%. She looks somewhat clinically dehydrated. Heart sounds are present and normal without murmurs. Lung fields are clear. She is alert and orientated.   Investigations:  No results found for this or any previous visit (from the past 240 hour(s)).   Basic Metabolic Panel:  Basename 02/19/12 0648 02/18/12 1556  NA 136 132*  K 4.7 4.7  CL 103 96  CO2 25 24  GLUCOSE 98 132*  BUN 41* 46*  CREATININE 1.88* 2.02*  CALCIUM 9.2 9.9  MG -- --  PHOS -- --   Liver Function Tests:  [facility] 02/19/12 0648 02/18/12 1556  AST 17 20  ALT 12 13  ALKPHOS 63 74  BILITOT 0.4 0.3  PROT 6.8 7.8  ALBUMIN 3.4* 4.0     CBC:  Basename 02/19/12 0648 02/18/12 1556  WBC 10.9* 12.2*  NEUTROABS -- --  HGB 10.3* 11.7*  HCT 32.5* 35.8*  MCV 89.5 87.5  PLT 243 298    Dg Wrist Complete Left  02/18/2012  *RADIOLOGY REPORT*  Clinical Data: Fall with pain.  LEFT WRIST - COMPLETE 3+ VIEW  Comparison: None.  Findings: Mild osteopenia.  Expected for age degenerative changes, including about the radial aspect of the carpus. No acute fracture or dislocation.  Scaphoid intact.  IMPRESSION:  No acute findings about the left wrist.  Original Report Authenticated By: Consuello Bossier, M.D.   Ct Head Wo Contrast  02/18/2012  *RADIOLOGY REPORT*  Clinical Data: Fall, trauma  CT HEAD WITHOUT CONTRAST  Technique:  Contiguous axial images  were obtained from the base of the skull through the vertex without contrast.  Comparison: None.  Findings: Diffuse brain atrophy noted with periventricular white matter microvascular ischemic changes.  No acute intracranial hemorrhage, mass lesion, definite acute infarction, midline shift, herniation, hydrocephalus, or extra-axial fluid collection.  Gray- white matter differentiation maintained.  Cisterns patent. Cerebellar atrophy as well.  Mastoids and sinuses clear.  Orbits symmetric.  IMPRESSION: Atrophy and microvascular ischemic changes.  Original Report Authenticated By: Judie Petit. Ruel Favors, M.D.   US Transvaginal Non-ob  02/19/2012  *RADIOLOGY REPORT*  Clinical Data: Vaginal bleeding.  Post menopausal female.  TRANSABDOMINAL AND TRANSVAGINAL ULTRASOUND OF PELVIS  Technique:  Both transabdominal and transvaginal ultrasound examinations of the pelvis were performed.  Transabdominal technique was performed for global imaging of the pelvis including uterus, ovaries, adnexal regions, and pelvic cul-de-sac.  It was necessary to proceed with endovaginal exam following the transabdominal exam to visualize the endometrium and adnexa.  Comparison:  None.  Findings:  A Foley catheter is seen within the urinary bladder, which is empty.  Uterus:  5.0 x 2.9 x 2.7 cm.  Retroverted.  Prominent vascular calcification noted within the myometrium.  No fibroids or other uterine masses are identified.  Endometrium: Tiny amount of fluid noted in the endometrial cavity. Double layer endometrial thickness excluding this fluid measures 4 mm.  No  focal abnormality visualized.  Right ovary: not directly visualized by transabdominal or transvaginal sonography, however no adnexal mass identified.  Left ovary: not directly visualized by transabdominal or transvaginal sonography, however no adnexal mass identified.  Other Findings:  No other adnexal mass or free fluid identified.  IMPRESSION:  1.  No evidence of fibroids. Endometrial  thickness measures 4 mm, which is within normal limits. If vaginal bleeding remains unresponsive to hormonal or medical therapy, sonohysterogram could be performed for focal lesion work-up. 2.  Nonvisualization of ovaries, however no adnexal mass identified.  Original Report Authenticated By: Danae Orleans, M.D.   US Pelvis Complete  02/19/2012  *RADIOLOGY REPORT*  Clinical Data: Vaginal bleeding.  Post menopausal female.  TRANSABDOMINAL AND TRANSVAGINAL ULTRASOUND OF PELVIS  Technique:  Both transabdominal and transvaginal ultrasound examinations of the pelvis were performed.  Transabdominal technique was performed for global imaging of the pelvis including uterus, ovaries, adnexal regions, and pelvic cul-de-sac.  It was necessary to proceed with endovaginal exam following the transabdominal exam to visualize the endometrium and adnexa.  Comparison:  None.  Findings:  A Foley catheter is seen within the urinary bladder, which is empty.  Uterus:  5.0 x 2.9 x 2.7 cm.  Retroverted.  Prominent vascular calcification noted within the myometrium.  No fibroids or other uterine masses are identified.  Endometrium: Tiny amount of fluid noted in the endometrial cavity. Double layer endometrial thickness excluding this fluid measures 4 mm.  No focal abnormality visualized.  Right ovary: not directly visualized by transabdominal or transvaginal sonography, however no adnexal mass identified.  Left ovary: not directly visualized by transabdominal or transvaginal sonography, however no adnexal mass identified.  Other Findings:  No other adnexal mass or free fluid identified.  IMPRESSION:  1.  No evidence of fibroids. Endometrial thickness measures 4 mm, which is within normal limits. If vaginal bleeding remains unresponsive to hormonal or medical therapy, sonohysterogram could be performed for focal lesion work-up. 2.  Nonvisualization of ovaries, however no adnexal mass identified.  Original Report Authenticated By: Danae Orleans, M.D.   Dg Chest Port 1 View  02/18/2012  *RADIOLOGY REPORT*  Clinical Data: Syncope  PORTABLE CHEST - 1 VIEW  Comparison: Chest x-ray of 10/03/2011  Findings: Moderate cardiomegaly is present.  No focal infiltrate or effusion is seen.  A permanent pacemaker remains.  Median sternotomy sutures are noted.  There are significant degenerative changes in both shoulders.  IMPRESSION: Stable cardiomegaly with permanent pacemaker.  No active lung disease.  Original Report Authenticated By: Juline Patch, M.D.   Dg Shoulder Left  02/18/2012  *RADIOLOGY REPORT*  Clinical Data: Left humerus fracture secondary to a fall.  LEFT SHOULDER - 2+ VIEW  Comparison: Radiographs dated 09/12/2009 and humerus images dated 02/18/2012  Findings: There is a comminuted slightly impacted fracture of the proximal left humeral shaft without angulation.  The patient has severe osteoarthritis of the glenohumeral joint with slight posterior subluxation of the humeral head but there is no dislocation.  No other acute abnormality.  IMPRESSION: Fracture of the proximal humerus as described.  Severe osteoarthritis of the glenohumeral joint.  Original Report Authenticated By: Gwynn Burly, M.D.   Dg Knee Complete 4 Views Left  02/18/2012  *RADIOLOGY REPORT*  Clinical Data: Recent fall, pain  LEFT KNEE - COMPLETE 4+ VIEW  Comparison: None.  Findings: No acute fracture is seen.  Mild degenerative joint disease is noted.  No effusion is seen.  Surgical clips overlie the medial left  knee.  IMPRESSION: No fracture.  No effusion.  Original Report Authenticated By: Juline Patch, M.D.   Dg Humerus Left  02/18/2012  *RADIOLOGY REPORT*  Clinical Data: Fall today with pain.  LEFT HUMERUS - 2+ VIEW  Comparison: 09/12/2009 shoulder films.  Findings: Incompletely imaged left-sided pacer.  Underlying glenohumeral joint osteoarthritis.  Likely comminuted proximal humerus fracture, suboptimally evaluated.  No dislocation identified.  Underlying  moderate osteopenia.  IMPRESSION: Proximal humerus fracture, likely comminuted.  Recommend further evaluation with dedicated shoulder films.  Original Report Authenticated By: Consuello Bossier, M.D.      Medications:  Scheduled:   . amiodarone  200 mg Oral QHS  . atorvastatin  10 mg Oral q1800  . carvedilol  12.5 mg Oral BID WC  . furosemide  40 mg Oral QHS  .  morphine injection  4 mg Intravenous Once  .  morphine injection  4 mg Intravenous Once  . ondansetron (ZOFRAN) IV  4 mg Intravenous Once  . ondansetron (ZOFRAN) IV  4 mg Intravenous Once  . pantoprazole  40 mg Oral Q1200  . potassium chloride SA  20 mEq Oral QPM  . predniSONE  10 mg Oral Once  . predniSONE  20 mg Oral Once  . sodium chloride  500 mL Intravenous Once  . sodium chloride  500 mL Intravenous Once  . warfarin  2.5 mg Oral Once  . warfarin  2.5 mg Oral ONCE-1800  . Warfarin - Pharmacist Dosing Inpatient   Does not apply q1800  . DISCONTD: sodium chloride   Intravenous STAT  . DISCONTD: spironolactone  12.5 mg Oral QHS    Impression: 1. Syncopal episode, unclear etiology, possibly related to hypovolemia. 2. Left humeral fracture. 3. Acute renal failure, likely related to dehydration. 4. Chronic systolic congestive heart failure, compensated. 5. Chronic atrial fibrillation on anticoagulation. 6. Vagina bleeding, ultrasound does not show evidence of fibroids. 7. Hypertension.     Plan: 1. Discontinue Aldactone. 2. Thoracic and lumbar spinal x-rays. 3. await orthopedic consultation.     LOS: 1 day   Wilson Singer Pager [phone]  02/19/2012, 12:05 PM

## 2012-02-20 DIAGNOSIS — I509 Heart failure, unspecified: Secondary | ICD-10-CM

## 2012-02-20 DIAGNOSIS — I5022 Chronic systolic (congestive) heart failure: Secondary | ICD-10-CM

## 2012-02-20 LAB — CBC
MCH: 28.9 pg (ref 26.0–34.0)
MCHC: 32.5 g/dL (ref 30.0–36.0)
MCV: 88.9 fL (ref 78.0–100.0)
Platelets: 221 10*3/uL (ref 150–400)
RBC: 3.5 MIL/uL — ABNORMAL LOW (ref 3.87–5.11)

## 2012-02-20 LAB — COMPREHENSIVE METABOLIC PANEL
AST: 14 U/L (ref 0–37)
CO2: 26 mEq/L (ref 19–32)
Calcium: 9.2 mg/dL (ref 8.4–10.5)
Creatinine, Ser: 1.78 mg/dL — ABNORMAL HIGH (ref 0.50–1.10)
GFR calc Af Amer: 29 mL/min — ABNORMAL LOW (ref >90)
GFR calc non Af Amer: 25 mL/min — ABNORMAL LOW (ref >90)
Glucose, Bld: 106 mg/dL — ABNORMAL HIGH (ref 70–99)

## 2012-02-20 MED ORDER — WARFARIN SODIUM 1 MG PO TABS: 1.0000 mg | ORAL_TABLET | Freq: Once | ORAL | Status: DC

## 2012-02-20 MED ORDER — SODIUM CHLORIDE 0.45 % IV SOLN
INTRAVENOUS | Status: DC
  Administered 2012-02-21: 07:00:00 via INTRAVENOUS

## 2012-02-20 NOTE — Progress Notes (Signed)
[name] [name]  78 y.o.  female  Subjective: The patient feels fine except for pain in the left arm. By history, she appears to be experiencing true loss of consciousness rather than loss of balance. If accurate concerning her estimate of the duration of unconsciousness been one hour, this would not likely be of cardiac origin unless there was a concomitant concussion.  Allergy: Ramipril; Contrast media; Heparin; Hydromorphone hcl; and Ramipril  Objective: Vital signs in last 24 hours: Temp:  [98.1 F (36.7 C)-98.3 F (36.8 C)] 98.2 F (36.8 C) (07/10 1540) Pulse Rate:  [69-70] 69  (07/10 1540) Resp:  [16-18] 16  (07/10 1540) BP: (129-152)/(72-77) 129/75 mmHg (07/10 1540) SpO2:  [96 %-98 %] 98 % (07/10 1540) Weight:  [53.2 kg (117 lb 4.6 oz)] 53.2 kg (117 lb 4.6 oz) (07/10 0549)  53.2 kg (117 lb 4.6 oz) Body mass index is 20.13 kg/(m^2).  Weight change: 3.304 kg (7 lb 4.6 oz) Last BM Date: 02/15/12  Intake/Output from previous day: 07/09 0701 - 07/10 0700 In: 1997.5 [P.O.:680; I.V.:1317.5] Out: 1950 [Urine:1950]  General- Well developed; no acute distress  Neck- No JVD Lungs- clear lung fields; normal I:E ratio Cardiovascular- normal PMI; normal S1 and S2; Grade 2/6 basilar systolic ejection murmur Abdomen- normal bowel sounds; soft and non-tender without masses or organomegaly Skin- Warm, no significant lesions Extremities- Nl distal pulses; no edema  Lab Results: Cardiac Markers:   Basename 02/19/12 1429 02/19/12 0648  TROPONINI <0.30 <0.30   CBC:   Basename 02/20/12 0520 02/19/12 0648  WBC 9.9 10.9*  HGB 10.1* 10.3*  HCT 31.1* 32.5*  PLT 221 243   BMET:  Basename 02/20/12 0520 02/19/12 0648  NA 134* 136  K 5.3* 4.7  CL 101 103  CO2 26 25  GLUCOSE 106* 98  BUN 39* 41*  CREATININE 1.78* 1.88*  CALCIUM 9.2 9.2   Hepatic Function:   Basename 02/20/12 0520  PROT 6.5  ALBUMIN 3.1*  AST 14  ALT 10  ALKPHOS 60  BILITOT 0.4  BILIDIR --  IBILI --   GFR:   Estimated Creatinine Clearance: 19.8 ml/min (by C-G formula based on Cr of 1.78).  Imaging Studies/Results: 02/18/2012  *RADIOLOGY REPORT*  Clinical Data: Syncope  PORTABLE CHEST - 1 VIEW  Comparison: Chest x-ray of 10/03/2011  Findings: Moderate cardiomegaly is present.  No focal infiltrate or effusion is seen.  A permanent pacemaker remains.  Median sternotomy sutures are noted.  There are significant degenerative changes in both shoulders.  IMPRESSION: Stable cardiomegaly with permanent pacemaker.  No active lung disease.  Original Report Authenticated By: Juline Patch, M.D.    Imaging: Imaging results have been reviewed  Medications:  I have reviewed the patient's current medications. Scheduled:   . atorvastatin  10 mg Oral q1800  . carvedilol  12.5 mg Oral BID WC  . pantoprazole  40 mg Oral Q1200  . predniSONE  10 mg Oral Once  . DISCONTD: amiodarone  200 mg Oral QHS  . DISCONTD: furosemide  40 mg Oral QHS  . DISCONTD: potassium chloride SA  20 mEq Oral QPM  . DISCONTD: warfarin  1 mg Oral ONCE-1800  . DISCONTD: Warfarin - Pharmacist Dosing Inpatient   Does not apply q1800    Infusions:     . sodium chloride 75 mL/hr at 02/20/12 1420    Principal Problem:  *Syncope and collapse Active Problems:  MITRAL REGURGITATION  HYPERTENSION  CAD  Atrial fibrillation  Left humeral fracture  Chronic  systolic CHF (congestive heart failure)  Cardiac pacemaker in situ  ARF (acute renal failure)  Vaginal bleeding  Gout   Assessment/Plan: Renal function is about at baseline, and weight has increased 8 pounds since admission over, net I&O is -2.5 L.   If renal function is stable to improved in the morning, intravenous fluids can be discontinued. It will ultimately be necessary to resume diuretic.  Her device will be interrogated, if this has not already been done, to exclude arrhythmia as the cause of her syncopal spells.   LOS: 2 days   Wheaton Bing 02/20/2012, 6:02 PM

## 2012-02-20 NOTE — Progress Notes (Signed)
UR Chart Review Completed  

## 2012-02-20 NOTE — Progress Notes (Addendum)
ANTICOAGULATION CONSULT NOTE  Pharmacy Consult for Warfarin Indication: atrial fibrillation  Allergies  Allergen Reactions  . Ramipril Hives  . Contrast Media (Iodinated Diagnostic Agents) Other (See Comments)    Reaction noted by md  . Heparin Other (See Comments)    Clots blood instead of thinning  . Hydromorphone Hcl Other (See Comments)    Crazy feeling  . Ramipril Hives   Patient Measurements: Height: 5\' 4"  (162.6 cm) Weight: 117 lb 4.6 oz (53.2 kg) IBW/kg (Calculated) : 54.7   Vital Signs: Temp: 98.3 F (36.8 C) (07/10 0549) Temp src: Oral (07/10 0549) BP: 137/77 mmHg (07/10 0549) Pulse Rate: 69  (07/10 0549)  Labs:  Basename 02/20/12 0520 02/19/12 1429 02/19/12 0648 02/19/12 02/18/12 1556  HGB 10.1* -- 10.3* -- --  HCT 31.1* -- 32.5* -- 35.8*  PLT 221 -- 243 -- 298  APTT -- -- -- -- --  LABPROT 27.1* -- 22.4* -- 20.3*  INR 2.46* -- 1.93* -- 1.70*  HEPARINUNFRC -- -- -- -- --  CREATININE 1.78* -- 1.88* -- 2.02*  CKTOTAL -- 22 22 23  --  CKMB -- 2.0 2.1 2.2 --  TROPONINI -- <0.30 <0.30 <0.30 --   Estimated Creatinine Clearance: 19.8 ml/min (by C-G formula based on Cr of 1.78).  Medical History: Past Medical History  Diagnosis Date  . Coronary artery disease     status post LAD stenting in 1999 after NSTEMI; CABG 2004; Last LHC 7/11: Proximal LAD 80%, mid 95%, distal 40%, mid R. High 40%, proximal RCA 40%, LIMA-LAD patent, SVG-PDA patent, EF 65%.   . Hypertension   . Hyperlipidemia   . Carotid artery disease     status post right carotid endarterectomy.  . Degenerative joint disease   . Restless leg syndrome   . Mild renal insufficiency   . Heparin induced thrombocytopenia   . Diphtheria     "as a child"  . Pleural effusion, bilateral 06/2003  . Pseudoaneurysm     status post repair following catherization in 1999  . Chronic systolic heart failure 10/02/11  . Ischemic cardiomyopathy     Echocardiogram 10/01/11: Mild LVH, EF 35-40%, basal inferior,  basal to mid posterior and basal to mid anterolateral severe hypokinesis, mild to moderate AI, moderate MR (ischemic MR), moderate LAE, mild RVE, mildly reduced RV systolic function, mild RAE, PASP 43-44  . COPD (chronic obstructive pulmonary disease)   . Atrial fibrillation/flutter     s/p multiple DCCVs;  amiodarone started 09/2011, TEE DCCV 3/13  . Tachycardia-bradycardia syndrome     s/p Medtr[employer] pacemaker implant 09/2011 (8 sec pause noted)  . GERD (gastroesophageal reflux disease)   . Renal artery stenosis     bilateral- status post stenting  . Non-functioning kidney   . Depression     "cries alot"  . Gout    Medications:  Scheduled:     . atorvastatin  10 mg Oral q1800  . carvedilol  12.5 mg Oral BID WC  . pantoprazole  40 mg Oral Q1200  . potassium chloride SA  20 mEq Oral QPM  . predniSONE  10 mg Oral Once  . warfarin  1 mg Oral ONCE-1800  . warfarin  2.5 mg Oral ONCE-1800  . Warfarin - Pharmacist Dosing Inpatient   Does not apply q1800  . DISCONTD: amiodarone  200 mg Oral QHS  . DISCONTD: furosemide  40 mg Oral QHS  . DISCONTD: spironolactone  12.5 mg Oral QHS   Assessment: Okay for Protocol Recent fall, vaginal  bleeding, left femur fracture. Continue Warfarin for now per IM (cardiology to help determine risk/benefit). INR therapeutic today, rising quickly Amiodarone d/c'd per cardiology  Goal of Therapy:  INR 2-3   Plan:  Warfarin 1mg  PO x 1 today Continue to titrate Coumadin to maintain therapeutic INR Daily INR.  Margo Aye, Demiana Crumbley A 02/20/2012,9:29 AM

## 2012-02-20 NOTE — Clinical Social Work Placement (Signed)
Clinical Social Work Department CLINICAL SOCIAL WORK PLACEMENT NOTE 02/20/2012  Patient:  [name] [name], [name] [name]  Account Number:  [id number] Admit date:  02/18/2012  Clinical Social Worker:  Derenda Fennel, LCSW  Date/time:  02/19/2012 12:35 PM  Clinical Social Work is seeking post-discharge placement for this patient at the following level of care:   SKILLED NURSING   (*CSW will update this form in Epic as items are completed)   02/19/2012  Patient/family provided with [facility] Department of Clinical Social Work's list of facilities offering this level of care within the geographic area requested by the patient (or if unable, by the patient's family).  02/19/2012  Patient/family informed of their freedom to choose among providers that offer the needed level of care, that participate in Medicare, Medicaid or managed care program needed by the patient, have an available bed and are willing to accept the patient.  02/19/2012  Patient/family informed of [facility]' ownership interest in [facility], as well as of the fact that they are under no obligation to receive care at this facility.  PASARR submitted to EDS on  PASARR number received from EDS on   FL2 transmitted to all facilities in geographic area requested by pt/family on  02/19/2012 FL2 transmitted to all facilities within larger geographic area on 02/19/2012  Patient informed that his/her managed care company has contracts with or will negotiate with  certain facilities, including the following:   n/a     Patient/family informed of bed offers received:  02/20/2012 Patient chooses bed at [place], [facility] Physician recommends and patient chooses bed at  [facility], [facility]  Patient to be transferred to  on   Patient to be transferred to facility by   The following physician request were entered in Epic:   Additional Comments: pt has previous pasarr number  Derenda Fennel, LCSW [phone]

## 2012-02-20 NOTE — Progress Notes (Signed)
Occupational Therapy Treatment Patient Details Name: LAROSE BATRES MRN: [id number] DOB: 05/14/1925 Today's Date: 02/20/2012 Time: 1204-1226 OT Time Calculation (min): 22 min  OT Assessment / Plan / Recommendation Comments on Treatment Session                     Plan Discharge plan remains appropriate    Precautions / Restrictions Precautions Precautions: Fall Precaution Comments: Left shoulder immobilizer on at all times until further doctor orders. Required Braces or Orthoses: Other Brace/Splint Other Brace/Splint: left shoulder immobilizer. Restrictions Weight Bearing Restrictions: No       ADL  Equipment Used: Other (comment);Gait belt (shoulder immobilizer)      OT Goals ADL Goals ADL Goal: Upper Body Bathing - Progress: Progressing toward goals ADL Goal: Lower Body Bathing - Progress: Progressing toward goals ADL Goal: Upper Body Dressing - Progress: Progressing toward goals ADL Goal: Lower Body Dressing - Progress: Progressing toward goals ADL Goal: Toilet Transfer - Progress: Progressing toward goals Arm Goals Arm Goal: Additional Goal #1 - Progress: Progressing toward goals Miscellaneous OT Goals OT Goal: Miscellaneous Goal #1 - Progress: Progressing toward goals  Visit Information  Last OT Received On: 02/20/12 Assistance Needed: +1    Subjective Data  Subjective: S:  I don't know when I am going to pass out, it just happens   Prior Functioning       Cognition  Overall Cognitive Status: Appears within functional limits for tasks assessed/performed Arousal/Alertness: Awake/alert Orientation Level: Appears intact for tasks assessed Behavior During Session: [facility] for tasks performed (about falling and movement hurting her arm.)    Mobility Bed Mobility Bed Mobility: Rolling Right;Right Sidelying to Sit;Sitting - Scoot to [place] of Bed Rolling Right: 4: Min assist Right Sidelying to Sit: 4: Min assist (max VC to use R UE to push to sitting, wanted to  hold LUE) Transfers Transfers: Sit to Stand Sit to Stand: 4: Min assist (did not want to push up w/ RUE wanted to hold LUE) Details for Transfer Assistance: Ambulated to bathroom to wash her hands.   Exercises    Balance    End of Session OT - End of Session Equipment Utilized During Treatment: Gait belt;Other (comment) (shoulder immobilizer) Activity Tolerance: Other (comment) (limited by dizziness) Patient left: in chair;with call bell/phone within reach;with family/visitor present  GO     Noralee Stain, Quinten Allerton L 02/20/2012, 2:14 PM

## 2012-02-20 NOTE — Clinical Social Work Note (Signed)
CSW presented bed offers to pt and she chooses [facility]. Facility notoified.  Awaiting stability for d/c.  Derenda Fennel, LCSW [phone]

## 2012-02-20 NOTE — Progress Notes (Signed)
Subjective: My shoulder is better today.   Objective: Vital signs in last 24 hours: Temp:  [97.4 F (36.3 C)-98.3 F (36.8 C)] 98.3 F (36.8 C) (07/10 0549) Pulse Rate:  [69-70] 69  (07/10 0549) Resp:  [17-18] 17  (07/10 0549) BP: (137-152)/(72-81) 137/77 mmHg (07/10 0549) SpO2:  [96 %-98 %] 96 % (07/10 0549) Weight:  [53.2 kg (117 lb 4.6 oz)] 53.2 kg (117 lb 4.6 oz) (07/10 0549)  Intake/Output from previous day: 07/09 0701 - 07/10 0700 In: 1997.5 [P.O.:680; I.V.:1317.5] Out: 1950 [Urine:1950] Intake/Output this shift:     Basename 02/20/12 0520 02/19/12 0648 02/18/12 1556  HGB 10.1* 10.3* 11.7*    Basename 02/20/12 0520 02/19/12 0648  WBC 9.9 10.9*  RBC 3.50* 3.63*  HCT 31.1* 32.5*  PLT 221 243    Basename 02/20/12 0520 02/19/12 0648  NA 134* 136  K 5.3* 4.7  CL 101 103  CO2 26 25  BUN 39* 41*  CREATININE 1.78* 1.88*  GLUCOSE 106* 98  CALCIUM 9.2 9.2    Basename 02/20/12 0520 02/19/12 0648  LABPT -- --  INR 2.46* 1.93*    Neurovascular intact Sensation intact distally Intact pulses distally  Assessment/Plan: Fracture proximal humerus.  Continue shoulder immobilizer and elevate head of bed.   [name] [name] 02/20/2012, 8:01 AM

## 2012-02-20 NOTE — Progress Notes (Signed)
[facility] PROGRESS NOTE  [name] [name] JYN:[id number] DOB: August 18, 1924 DOA: 02/18/2012 PCP: Josue Hector, MD  Assessment/Plan: Principal Problem:  *Syncope and collapse Active Problems:  MITRAL REGURGITATION  HYPERTENSION  CAD  Atrial fibrillation  Left humeral fracture  Chronic systolic CHF (congestive heart failure)  Cardiac pacemaker in situ  ARF (acute renal failure)  Vaginal bleeding  Gout  1. Syncope. Likely related to dehydration. Renal function improves with some IV fluid. Cardiology consulted and did not feel that this was a cardiac cause of syncope. VQ scan was found to be negative. 2. Acute renal failure on chronic kidney disease. We'll give gentle hydration And repeat labs in the morning. Currently she stills appears to be somewhat volume depleted. 3. Chronic systolic congestive heart failure. No signs of volume overload. We'll watch closely with gentle hydration. Hold Lasix and Aldactone for now. 4. Left humeral fracture. Seen by Dr. Hilda Lias. Recommends to continue immobilizer and followup as an outpatient 5. Atrial fibrillation. With the patient's multiple falls, it does not appear that she is a good candidate for anticoagulation. This can be readdressed in the future if her stability improves. For now her anticoagulation will be discontinued. This was discussed with the patient as well as her daughter and both of them are in agreement. She will followup with cardiology for further recommendations on this. she does understand the increased risk of stroke while stopping anticoagulation, but agreed that her risk of bleeding with multiple falls is higher. 6. Vaginal bleeding. Pelvic ultrasound does not show any significant findings. May be related to supratherapeutic INR 7. Deconditioning. Physical therapy recommended skilled nursing facility placement. Patient is agreeable. Possible discharge in the morning if bed is available.  Code Status: DO NOT  RESUSCITATE Family Communication: Discussed with patient and daughter at bedside Disposition Plan: Skilled nursing facility placement  Brief narrative: This is an 78 year old female who presents to the hospital after having a syncopal event. She had multiple cardiac risk factors. She was found to be dehydrated and in acute renal failure. She's also suffered a left humeral fracture. She was admitted for further evaluation and treatment  Consultants:  [facility] cardiology  Orthopedics, Dr. Hilda Lias  Procedures:  None  Antibiotics:  None  HPI/Subjective: Patient says she does not feel well today due to pain in her left arm. She feels the immobilizer is helping her pain. She does not have any other specific complaints.  Objective: Filed Vitals:   02/19/12 2139 02/20/12 0510 02/20/12 0549 02/20/12 1219  BP: 152/72  137/77   Pulse: 70  69   Temp: 98.1 F (36.7 C)  98.3 F (36.8 C)   TempSrc:   Oral   Resp: 18  17   Height:      Weight:   53.2 kg (117 lb 4.6 oz)   SpO2: 97% 97% 96% 97%    Intake/Output Summary (Last 24 hours) at 02/20/12 1407 Last data filed at 02/20/12 1130  Gross per 24 hour  Intake 1917.5 ml  Output   3150 ml  Net -1232.5 ml    Exam:   General:  Lying in bed, does not appear to be in any acute distress  Cardiovascular: S1, S2, regular rate and rhythm  Respiratory: Clear to auscultation bilaterally  Abdomen: Soft, nontender, nondistended, bowel sounds are active  Data Reviewed: Basic Metabolic Panel:  Lab 02/20/12 8657 02/19/12 0648 02/18/12 1556  NA 134* 136 132*  K 5.3* 4.7 4.7  CL 101 103 96  CO2 26 25 24  GLUCOSE 106* 98 132*  BUN 39* 41* 46*  CREATININE 1.78* 1.88* 2.02*  CALCIUM 9.2 9.2 9.9  MG -- -- --  PHOS -- -- --   Liver Function Tests:  Lab 02/20/12 0520 02/19/12 0648 02/18/12 1556  AST 14 17 20   ALT 10 12 13   ALKPHOS 60 63 74  BILITOT 0.4 0.4 0.3  PROT 6.5 6.8 7.8  ALBUMIN 3.1* 3.4* 4.0   No results found for  this basename: LIPASE:5,AMYLASE:5 in the last 168 hours No results found for this basename: AMMONIA:5 in the last 168 hours CBC:  Lab 02/20/12 0520 02/19/12 0648 02/18/12 1556  WBC 9.9 10.9* 12.2*  NEUTROABS -- -- --  HGB 10.1* 10.3* 11.7*  HCT 31.1* 32.5* 35.8*  MCV 88.9 89.5 87.5  PLT 221 243 298   Cardiac Enzymes:  Lab 02/19/12 1429 02/19/12 0648 02/19/12  CKTOTAL 22 22 23   CKMB 2.0 2.1 2.2  CKMBINDEX -- -- --  TROPONINI <0.30 <0.30 <0.30   BNP (last 3 results)  Basename 09/30/11 0935 06/10/11 0535 06/09/11 1210  PROBNP 20600.0* 6924.0* 21704.0*   CBG: No results found for this basename: GLUCAP:5 in the last 168 hours  No results found for this or any previous visit (from the past 240 hour(s)).   Studies: Dg Thoracic Spine 2 View  02/19/2012  *RADIOLOGY REPORT*  Clinical Data: Upper back pain secondary to a fall.  Evaluate for fracture.  THORACIC SPINE - 2 VIEW  Comparison: None.  Findings: Skeletal osteopenia.  Mid thoracic disc space narrowing. No fracture or traumatic subluxation.  Dual lead pacer. Cardiomegaly.  IMPRESSION: No thoracic vertebral body fractures observed.  Original Report Authenticated By: Elsie Stain, M.D.   Dg Lumbar Spine 2-3 Views  02/19/2012  *RADIOLOGY REPORT*  Clinical Data: Back pain after fall  LUMBAR SPINE - 2-3 VIEW  Comparison: None.  Findings: Disc space narrowing L4-5 and L5-S1.  No osseous destructive lesion.  No acute compression fracture.  Vascular calcification with previous stents.  IMPRESSION: No acute findings.  Original Report Authenticated By: Elsie Stain, M.D.   Dg Wrist Complete Left  02/18/2012  *RADIOLOGY REPORT*  Clinical Data: Fall with pain.  LEFT WRIST - COMPLETE 3+ VIEW  Comparison: None.  Findings: Mild osteopenia.  Expected for age degenerative changes, including about the radial aspect of the carpus. No acute fracture or dislocation.  Scaphoid intact.  IMPRESSION:  No acute findings about the left wrist.  Original  Report Authenticated By: Consuello Bossier, M.D.   Ct Head Wo Contrast  02/18/2012  *RADIOLOGY REPORT*  Clinical Data: Fall, trauma  CT HEAD WITHOUT CONTRAST  Technique:  Contiguous axial images were obtained from the base of the skull through the vertex without contrast.  Comparison: None.  Findings: Diffuse brain atrophy noted with periventricular white matter microvascular ischemic changes.  No acute intracranial hemorrhage, mass lesion, definite acute infarction, midline shift, herniation, hydrocephalus, or extra-axial fluid collection.  Gray- white matter differentiation maintained.  Cisterns patent. Cerebellar atrophy as well.  Mastoids and sinuses clear.  Orbits symmetric.  IMPRESSION: Atrophy and microvascular ischemic changes.  Original Report Authenticated By: Judie Petit. Ruel Favors, M.D.   US Transvaginal Non-ob  02/19/2012  *RADIOLOGY REPORT*  Clinical Data: Vaginal bleeding.  Post menopausal female.  TRANSABDOMINAL AND TRANSVAGINAL ULTRASOUND OF PELVIS  Technique:  Both transabdominal and transvaginal ultrasound examinations of the pelvis were performed.  Transabdominal technique was performed for global imaging of the pelvis including uterus, ovaries, adnexal regions, and pelvic cul-de-sac.  It was necessary to proceed with endovaginal exam following the transabdominal exam to visualize the endometrium and adnexa.  Comparison:  None.  Findings:  A Foley catheter is seen within the urinary bladder, which is empty.  Uterus:  5.0 x 2.9 x 2.7 cm.  Retroverted.  Prominent vascular calcification noted within the myometrium.  No fibroids or other uterine masses are identified.  Endometrium: Tiny amount of fluid noted in the endometrial cavity. Double layer endometrial thickness excluding this fluid measures 4 mm.  No focal abnormality visualized.  Right ovary: not directly visualized by transabdominal or transvaginal sonography, however no adnexal mass identified.  Left ovary: not directly visualized by  transabdominal or transvaginal sonography, however no adnexal mass identified.  Other Findings:  No other adnexal mass or free fluid identified.  IMPRESSION:  1.  No evidence of fibroids. Endometrial thickness measures 4 mm, which is within normal limits. If vaginal bleeding remains unresponsive to hormonal or medical therapy, sonohysterogram could be performed for focal lesion work-up. 2.  Nonvisualization of ovaries, however no adnexal mass identified.  Original Report Authenticated By: Danae Orleans, M.D.   US Pelvis Complete  02/19/2012  *RADIOLOGY REPORT*  Clinical Data: Vaginal bleeding.  Post menopausal female.  TRANSABDOMINAL AND TRANSVAGINAL ULTRASOUND OF PELVIS  Technique:  Both transabdominal and transvaginal ultrasound examinations of the pelvis were performed.  Transabdominal technique was performed for global imaging of the pelvis including uterus, ovaries, adnexal regions, and pelvic cul-de-sac.  It was necessary to proceed with endovaginal exam following the transabdominal exam to visualize the endometrium and adnexa.  Comparison:  None.  Findings:  A Foley catheter is seen within the urinary bladder, which is empty.  Uterus:  5.0 x 2.9 x 2.7 cm.  Retroverted.  Prominent vascular calcification noted within the myometrium.  No fibroids or other uterine masses are identified.  Endometrium: Tiny amount of fluid noted in the endometrial cavity. Double layer endometrial thickness excluding this fluid measures 4 mm.  No focal abnormality visualized.  Right ovary: not directly visualized by transabdominal or transvaginal sonography, however no adnexal mass identified.  Left ovary: not directly visualized by transabdominal or transvaginal sonography, however no adnexal mass identified.  Other Findings:  No other adnexal mass or free fluid identified.  IMPRESSION:  1.  No evidence of fibroids. Endometrial thickness measures 4 mm, which is within normal limits. If vaginal bleeding remains unresponsive to  hormonal or medical therapy, sonohysterogram could be performed for focal lesion work-up. 2.  Nonvisualization of ovaries, however no adnexal mass identified.  Original Report Authenticated By: Danae Orleans, M.D.   Dg Chest Port 1 View  02/18/2012  *RADIOLOGY REPORT*  Clinical Data: Syncope  PORTABLE CHEST - 1 VIEW  Comparison: Chest x-ray of 10/03/2011  Findings: Moderate cardiomegaly is present.  No focal infiltrate or effusion is seen.  A permanent pacemaker remains.  Median sternotomy sutures are noted.  There are significant degenerative changes in both shoulders.  IMPRESSION: Stable cardiomegaly with permanent pacemaker.  No active lung disease.  Original Report Authenticated By: Juline Patch, M.D.   Dg Shoulder Left  02/18/2012  *RADIOLOGY REPORT*  Clinical Data: Left humerus fracture secondary to a fall.  LEFT SHOULDER - 2+ VIEW  Comparison: Radiographs dated 09/12/2009 and humerus images dated 02/18/2012  Findings: There is a comminuted slightly impacted fracture of the proximal left humeral shaft without angulation.  The patient has severe osteoarthritis of the glenohumeral joint with slight posterior subluxation of the humeral head but there  is no dislocation.  No other acute abnormality.  IMPRESSION: Fracture of the proximal humerus as described.  Severe osteoarthritis of the glenohumeral joint.  Original Report Authenticated By: Gwynn Burly, M.D.   Dg Knee Complete 4 Views Left  02/18/2012  *RADIOLOGY REPORT*  Clinical Data: Recent fall, pain  LEFT KNEE - COMPLETE 4+ VIEW  Comparison: None.  Findings: No acute fracture is seen.  Mild degenerative joint disease is noted.  No effusion is seen.  Surgical clips overlie the medial left knee.  IMPRESSION: No fracture.  No effusion.  Original Report Authenticated By: Juline Patch, M.D.   Dg Humerus Left  02/18/2012  *RADIOLOGY REPORT*  Clinical Data: Fall today with pain.  LEFT HUMERUS - 2+ VIEW  Comparison: 09/12/2009 shoulder films.   Findings: Incompletely imaged left-sided pacer.  Underlying glenohumeral joint osteoarthritis.  Likely comminuted proximal humerus fracture, suboptimally evaluated.  No dislocation identified.  Underlying moderate osteopenia.  IMPRESSION: Proximal humerus fracture, likely comminuted.  Recommend further evaluation with dedicated shoulder films.  Original Report Authenticated By: Consuello Bossier, M.D.    Scheduled Meds:   . atorvastatin  10 mg Oral q1800  . carvedilol  12.5 mg Oral BID WC  . pantoprazole  40 mg Oral Q1200  . predniSONE  10 mg Oral Once  . warfarin  1 mg Oral ONCE-1800  . warfarin  2.5 mg Oral ONCE-1800  . Warfarin - Pharmacist Dosing Inpatient   Does not apply q1800  . DISCONTD: amiodarone  200 mg Oral QHS  . DISCONTD: furosemide  40 mg Oral QHS  . DISCONTD: potassium chloride SA  20 mEq Oral QPM   Continuous Infusions:    Brielyn Bosak [facility] Pager [phone]  If 7PM-7AM, please contact night-coverage [email or web address] Password [email or web address] 02/20/2012, 2:07 PM   LOS: 2 days

## 2012-02-21 LAB — BASIC METABOLIC PANEL
Calcium: 8.8 mg/dL (ref 8.4–10.5)
GFR calc Af Amer: 32 mL/min — ABNORMAL LOW (ref >90)
GFR calc non Af Amer: 27 mL/min — ABNORMAL LOW (ref >90)
Sodium: 131 mEq/L — ABNORMAL LOW (ref 135–145)

## 2012-02-21 LAB — PROTIME-INR: INR: 2.57 — ABNORMAL HIGH (ref 0.00–1.49)

## 2012-02-21 MED ORDER — GLYCERIN (LAXATIVE) 2.1 G RE SUPP
1.0000 | Freq: Once | RECTAL | Status: DC
  Filled 2012-02-21: qty 1

## 2012-02-21 MED ORDER — ALPRAZOLAM 0.25 MG PO TABS: 0.2500 mg | ORAL_TABLET | Freq: Every evening | ORAL | Status: DC | PRN

## 2012-02-21 MED ORDER — HYDROCODONE-ACETAMINOPHEN 7.5-325 MG PO TABS: 1.0000 | ORAL_TABLET | ORAL | Status: DC | PRN

## 2012-02-21 NOTE — Progress Notes (Signed)
Discharged to [employer] called,and given to [employer].Family at bedside.Accompanied by staff to an awaiting vehicle.

## 2012-02-21 NOTE — Clinical Social Work Note (Signed)
Pt d/c today by MD to [facility].  Pt, pt's daughter, and facility aware and agreeable.  Pt to transfer with daughter.  D/C summary faxed.    Derenda Fennel, [place] [phone]

## 2012-02-21 NOTE — Discharge Summary (Signed)
Physician Discharge Summary  [name] [name] ZOX:[id number] DOB: October 10, 1924 DOA: 02/18/2012  PCP: Josue Hector, MD  Admit date: 02/18/2012 Discharge date: 02/21/2012  Recommendations for Outpatient Follow-up:  1. Patient will be transferred to a skilled nursing facility for physical therapy 2. She will need followup with Dr. Hilda Lias one week after discharge for followup on left proximal humerus fracture. She will need to continue in an immobilizer until then. She should sleep semierect. 3. She will need followup with Dr. Zearing Lions regarding her atrial fibrillation and need for anticoagulation. Currently her anticoagulation has been held due to repeated falls.  4. Followup with a gynecologist if any further vaginal bleeding occurs 5. Metabolic panel in 5 days, and if this is normal then patient should resume diuretics  Discharge Diagnoses:  Principal Problem:  *Syncope and collapse Active Problems:  MITRAL REGURGITATION  HYPERTENSION  CAD  Atrial fibrillation  Left humeral fracture  Chronic systolic CHF (congestive heart failure)  Cardiac pacemaker in situ  ARF (acute renal failure)  Vaginal bleeding  Gout Repeated Falls Dehydration  Discharge Condition: Improved  Diet recommendation: Heart healthy  History of present illness:  This is 78 year old, pleasant, white female with a past medical history of atrial fibrillation, pacemaker, on chronic anticoagulation, hypertension, coronary artery disease, gout, who was sitting on the porch earlier this morning. Her daughter went out for some errands. The patient decided to walk into the yard to gather some blackberries. She bent over to pick one of blackberries she passed out and next thing she knew she was waking up on the floor. She had excruciating pain in the left shoulder area. She yelled out and her son-in-law came to check on her and helped her into the house. Subsequently, she was brought into the hospital for further  evaluation. While she was lying on the floor patient had nausea and she threw up a couple times. Denies any blood in the emesis. She denies any focal weakness. Has had had shortness of breath, or while which has been somewhat worse over the past few weeks according to the daughter. Had some chest pain versus palpitation, but she is unable to describe it any further.  She also tells me that this is probably the seventh time that she has fallen down in the last 6 weeks. And every time it is associated with a syncopal episode. A few days ago she was started on steroids probably for acute gout in her knee and her wrist. As a result of it she has noted swelling in the face. She does use oxygen at home at times. She did have dizziness earlier today, which she describes as feeling swimmy headed. Denies any vertiginous symptoms. She also tells me that about a week ago she had vaginal bleeding, which lasted one day. She hasn't had any vaginal discharge since then. History was somewhat limited.   Hospital Course:  This lady was admitted to the hospital after having a syncopal event. It was felt that she was dehydrated which was likely causing her syncopal episodes. She does have a pacemaker and was seen by cardiology. According to cardiology her pacemaker was interrogated and did not find any abnormalities. Her amiodarone was discontinued.  VQ scan was found to be negative. Cardiac markers were negative x3. Her daughter reports that she's had 6 or 7 falls over the past few weeks. Due to her repeated falls her Coumadin has been discontinued. This can be reassessed as patient's stability improves. She has been encouraged to follow up  with her cardiologist regarding this. She was given gentle IV fluids and her diuretics were held. Her renal function has since improved and she's not had any further syncopal episodes. We will continue to hold her diuretics for now, but she'll need repeat basic metabolic panel in the next 5  days. If her renal function remains stable then her diuretics may be present.  The patient was noted to have a left humeral fracture. She was seen by Dr. Hilda Lias and an immobilizer was recommended for her shoulder. She'll follow up in the outpatient setting in one week after discharge.  The patient did report some vaginal bleeding. Ultrasound of the pelvis did not show any acute findings. It may be related to supratherapeutic INR. If her vaginal bleeding resumes she'll need outpatient followup with a gynecologist  Procedures:  None   Consultations:   cardiology  Discharge Exam: Filed Vitals:   02/21/12 0450  BP: 140/78  Pulse: 94  Temp: 98 F (36.7 C)  Resp: 18   Filed Vitals:   02/20/12 1219 02/20/12 1540 02/20/12 2143 02/21/12 0450  BP:  129/75 159/80 140/78  Pulse:  69 70 94  Temp:  98.2 F (36.8 C) 97.4 F (36.3 C) 98 F (36.7 C)  TempSrc:  Oral Oral Oral  Resp:  16 18 18   Height:      Weight:    56.3 kg (124 lb 1.9 oz)  SpO2: 97% 98% 96% 91%   General: NAD, AAOx3 Cardiovascular: s1, s2, rrr  Respiratory: CTA B  Discharge Instructions  Discharge Orders    Future Orders Please Complete By Expires   Diet - low sodium heart healthy      Increase activity slowly        Medication List  As of 02/21/2012  1:26 PM   STOP taking these medications         amiodarone 200 MG tablet      furosemide 40 MG tablet      potassium chloride SA 20 MEQ tablet      predniSONE 10 MG tablet      spironolactone 25 MG tablet      warfarin 5 MG tablet         TAKE these medications         ALPRAZolam 0.25 MG tablet   Commonly known as: XANAX   Take 0.25 mg by mouth at bedtime as needed. For sleep      bisacodyl 5 MG EC tablet   Commonly known as: DULCOLAX   Take 5 mg by mouth daily as needed. For constipation      carvedilol 12.5 MG tablet   Commonly known as: COREG   Take 1 tablet (12.5 mg total) by mouth 2 (two) times daily with a meal.      diltiazem  120 MG 24 hr capsule   Commonly known as: CARDIZEM CD   Take 120 mg by mouth at bedtime.      HYDROcodone-acetaminophen 7.5-325 MG per tablet   Commonly known as: NORCO   Take 1-2 tablets by mouth every 4 (four) hours as needed. For pain      nitroGLYCERIN 0.4 MG SL tablet   Commonly known as: NITROSTAT   Place 0.4 mg under the tongue every 5 (five) minutes as needed. For chest pain      omeprazole 20 MG capsule   Commonly known as: PRILOSEC   Take 20 mg by mouth at bedtime.      rosuvastatin 5 MG tablet  Commonly known as: CRESTOR   Take 1 tablet (5 mg total) by mouth daily at 6 PM.      VITAMIN B-12 IJ   Inject 1,000 mcg as directed. Every two weeks.      Vitamin D (Ergocalciferol) 50000 UNITS Caps   Commonly known as: DRISDOL   Take 50,000 Units by mouth every 7 (seven) days. On Fridays           Follow-up Information    Follow up with Josue Hector, MD. (once discharged from nursing home)    Contact information:   [address] [place] [address] [phone]       Follow up with Darreld Mclean, MD in 2 weeks.   Contact information:   [address] [place] [address] [phone]       Follow up with Rollene Rotunda, MD. Schedule an appointment as soon as possible for a visit in 2 weeks.   Contact information:   [address] [place] [address] [phone]           The results of significant diagnostics from this hospitalization (including imaging, microbiology, ancillary and laboratory) are listed below for reference.    Significant Diagnostic Studies: Dg Thoracic Spine 2 View  02/19/2012  *RADIOLOGY REPORT*  Clinical Data: Upper back pain secondary to a fall.  Evaluate for fracture.  THORACIC SPINE - 2 VIEW  Comparison: None.  Findings: Skeletal osteopenia.  Mid thoracic disc space narrowing. No fracture or traumatic subluxation.  Dual lead pacer.  Cardiomegaly.  IMPRESSION: No thoracic vertebral body fractures observed.  Original Report Authenticated By: Elsie Stain, M.D.   Dg Lumbar Spine 2-3 Views  02/19/2012  *RADIOLOGY REPORT*  Clinical Data: Back pain after fall  LUMBAR SPINE - 2-3 VIEW  Comparison: None.  Findings: Disc space narrowing L4-5 and L5-S1.  No osseous destructive lesion.  No acute compression fracture.  Vascular calcification with previous stents.  IMPRESSION: No acute findings.  Original Report Authenticated By: Elsie Stain, M.D.   Dg Wrist Complete Left  02/18/2012  *RADIOLOGY REPORT*  Clinical Data: Fall with pain.  LEFT WRIST - COMPLETE 3+ VIEW  Comparison: None.  Findings: Mild osteopenia.  Expected for age degenerative changes, including about the radial aspect of the carpus. No acute fracture or dislocation.  Scaphoid intact.  IMPRESSION:  No acute findings about the left wrist.  Original Report Authenticated By: Consuello Bossier, M.D.   Ct Head Wo Contrast  02/18/2012  *RADIOLOGY REPORT*  Clinical Data: Fall, trauma  CT HEAD WITHOUT CONTRAST  Technique:  Contiguous axial images were obtained from the base of the skull through the vertex without contrast.  Comparison: None.  Findings: Diffuse brain atrophy noted with periventricular white matter microvascular ischemic changes.  No acute intracranial hemorrhage, mass lesion, definite acute infarction, midline shift, herniation, hydrocephalus, or extra-axial fluid collection.  Gray- white matter differentiation maintained.  Cisterns patent. Cerebellar atrophy as well.  Mastoids and sinuses clear.  Orbits symmetric.  IMPRESSION: Atrophy and microvascular ischemic changes.  Original Report Authenticated By: Judie Petit. Ruel Favors, M.D.   US Transvaginal Non-ob  02/19/2012  *RADIOLOGY REPORT*  Clinical Data: Vaginal bleeding.  Post menopausal female.  TRANSABDOMINAL AND TRANSVAGINAL ULTRASOUND OF PELVIS  Technique:  Both transabdominal and transvaginal ultrasound examinations of the  pelvis were performed.  Transabdominal technique was performed for global imaging of the pelvis including uterus, ovaries, adnexal regions, and pelvic cul-de-sac.  It was necessary  to proceed with endovaginal exam following the transabdominal exam to visualize the endometrium and adnexa.  Comparison:  None.  Findings:  A Foley catheter is seen within the urinary bladder, which is empty.  Uterus:  5.0 x 2.9 x 2.7 cm.  Retroverted.  Prominent vascular calcification noted within the myometrium.  No fibroids or other uterine masses are identified.  Endometrium: Tiny amount of fluid noted in the endometrial cavity. Double layer endometrial thickness excluding this fluid measures 4 mm.  No focal abnormality visualized.  Right ovary: not directly visualized by transabdominal or transvaginal sonography, however no adnexal mass identified.  Left ovary: not directly visualized by transabdominal or transvaginal sonography, however no adnexal mass identified.  Other Findings:  No other adnexal mass or free fluid identified.  IMPRESSION:  1.  No evidence of fibroids. Endometrial thickness measures 4 mm, which is within normal limits. If vaginal bleeding remains unresponsive to hormonal or medical therapy, sonohysterogram could be performed for focal lesion work-up. 2.  Nonvisualization of ovaries, however no adnexal mass identified.  Original Report Authenticated By: Danae Orleans, M.D.   US Pelvis Complete  02/19/2012  *RADIOLOGY REPORT*  Clinical Data: Vaginal bleeding.  Post menopausal female.  TRANSABDOMINAL AND TRANSVAGINAL ULTRASOUND OF PELVIS  Technique:  Both transabdominal and transvaginal ultrasound examinations of the pelvis were performed.  Transabdominal technique was performed for global imaging of the pelvis including uterus, ovaries, adnexal regions, and pelvic cul-de-sac.  It was necessary to proceed with endovaginal exam following the transabdominal exam to visualize the endometrium and adnexa.  Comparison:   None.  Findings:  A Foley catheter is seen within the urinary bladder, which is empty.  Uterus:  5.0 x 2.9 x 2.7 cm.  Retroverted.  Prominent vascular calcification noted within the myometrium.  No fibroids or other uterine masses are identified.  Endometrium: Tiny amount of fluid noted in the endometrial cavity. Double layer endometrial thickness excluding this fluid measures 4 mm.  No focal abnormality visualized.  Right ovary: not directly visualized by transabdominal or transvaginal sonography, however no adnexal mass identified.  Left ovary: not directly visualized by transabdominal or transvaginal sonography, however no adnexal mass identified.  Other Findings:  No other adnexal mass or free fluid identified.  IMPRESSION:  1.  No evidence of fibroids. Endometrial thickness measures 4 mm, which is within normal limits. If vaginal bleeding remains unresponsive to hormonal or medical therapy, sonohysterogram could be performed for focal lesion work-up. 2.  Nonvisualization of ovaries, however no adnexal mass identified.  Original Report Authenticated By: Danae Orleans, M.D.   Dg Chest Port 1 View  02/18/2012  *RADIOLOGY REPORT*  Clinical Data: Syncope  PORTABLE CHEST - 1 VIEW  Comparison: Chest x-ray of 10/03/2011  Findings: Moderate cardiomegaly is present.  No focal infiltrate or effusion is seen.  A permanent pacemaker remains.  Median sternotomy sutures are noted.  There are significant degenerative changes in both shoulders.  IMPRESSION: Stable cardiomegaly with permanent pacemaker.  No active lung disease.  Original Report Authenticated By: Juline Patch, M.D.   Dg Shoulder Left  02/18/2012  *RADIOLOGY REPORT*  Clinical Data: Left humerus fracture secondary to a fall.  LEFT SHOULDER - 2+ VIEW  Comparison: Radiographs dated 09/12/2009 and humerus images dated 02/18/2012  Findings: There is a comminuted slightly impacted fracture of the proximal left humeral shaft without angulation.  The patient has  severe osteoarthritis of the glenohumeral joint with slight posterior subluxation of the humeral head but there is no dislocation.  No other acute abnormality.  IMPRESSION: Fracture of the proximal humerus as described.  Severe osteoarthritis of the glenohumeral joint.  Original Report Authenticated By: Gwynn Burly, M.D.   Dg Knee Complete 4 Views Left  02/18/2012  *RADIOLOGY REPORT*  Clinical Data: Recent fall, pain  LEFT KNEE - COMPLETE 4+ VIEW  Comparison: None.  Findings: No acute fracture is seen.  Mild degenerative joint disease is noted.  No effusion is seen.  Surgical clips overlie the medial left knee.  IMPRESSION: No fracture.  No effusion.  Original Report Authenticated By: Juline Patch, M.D.   Dg Humerus Left  02/18/2012  *RADIOLOGY REPORT*  Clinical Data: Fall today with pain.  LEFT HUMERUS - 2+ VIEW  Comparison: 09/12/2009 shoulder films.  Findings: Incompletely imaged left-sided pacer.  Underlying glenohumeral joint osteoarthritis.  Likely comminuted proximal humerus fracture, suboptimally evaluated.  No dislocation identified.  Underlying moderate osteopenia.  IMPRESSION: Proximal humerus fracture, likely comminuted.  Recommend further evaluation with dedicated shoulder films.  Original Report Authenticated By: Consuello Bossier, M.D.    Microbiology: No results found for this or any previous visit (from the past 240 hour(s)).   Labs: Basic Metabolic Panel:  Lab 02/21/12 4098 02/20/12 0520 02/19/12 0648 02/18/12 1556  NA 131* 134* 136 132*  K 4.5 5.3* 4.7 4.7  CL 99 101 103 96  CO2 25 26 25 24   GLUCOSE 91 106* 98 132*  BUN 37* 39* 41* 46*  CREATININE 1.66* 1.78* 1.88* 2.02*  CALCIUM 8.8 9.2 9.2 9.9  MG -- -- -- --  PHOS -- -- -- --   Liver Function Tests:  Lab 02/20/12 0520 02/19/12 0648 02/18/12 1556  AST 14 17 20   ALT 10 12 13   ALKPHOS 60 63 74  BILITOT 0.4 0.4 0.3  PROT 6.5 6.8 7.8  ALBUMIN 3.1* 3.4* 4.0   No results found for this basename: LIPASE:5,AMYLASE:5  in the last 168 hours No results found for this basename: AMMONIA:5 in the last 168 hours CBC:  Lab 02/20/12 0520 02/19/12 0648 02/18/12 1556  WBC 9.9 10.9* 12.2*  NEUTROABS -- -- --  HGB 10.1* 10.3* 11.7*  HCT 31.1* 32.5* 35.8*  MCV 88.9 89.5 87.5  PLT 221 243 298   Cardiac Enzymes:  Lab 02/19/12 1429 02/19/12 0648 02/19/12  CKTOTAL 22 22 23   CKMB 2.0 2.1 2.2  CKMBINDEX -- -- --  TROPONINI <0.30 <0.30 <0.30   BNP: BNP (last 3 results)  Basename 09/30/11 0935 06/10/11 0535 06/09/11 1210  PROBNP 20600.0* 6924.0* 21704.0*   CBG: No results found for this basename: GLUCAP:5 in the last 168 hours  Time coordinating discharge:  Signed:  MEMON,JEHANZEB  [facility] 02/21/2012, 1:26 PM

## 2012-02-21 NOTE — Clinical Social Work Placement (Signed)
Clinical Social Work Department CLINICAL SOCIAL WORK PLACEMENT NOTE 02/21/2012  Patient:  [name] [name], [name] [name]  Account Number:  [id number] Admit date:  02/18/2012  Clinical Social Worker:  Derenda Fennel, LCSW  Date/time:  02/19/2012 12:35 PM  Clinical Social Work is seeking post-discharge placement for this patient at the following level of care:   SKILLED NURSING   (*CSW will update this form in Epic as items are completed)   02/19/2012  Patient/family provided with [facility] Department of Clinical Social Work's list of facilities offering this level of care within the geographic area requested by the patient (or if unable, by the patient's family).  02/19/2012  Patient/family informed of their freedom to choose among providers that offer the needed level of care, that participate in Medicare, Medicaid or managed care program needed by the patient, have an available bed and are willing to accept the patient.  02/19/2012  Patient/family informed of [facility]' ownership interest in [facility], as well as of the fact that they are under no obligation to receive care at this facility.  PASARR submitted to [employer] on  PASARR number received from [employer] on   FL2 transmitted to all facilities in geographic area requested by pt/family on  02/19/2012 FL2 transmitted to all facilities within larger geographic area on 02/19/2012  Patient informed that his/her managed care company has contracts with or will negotiate with  certain facilities, including the following:   n/a     Patient/family informed of bed offers received:  02/20/2012 Patient chooses bed at [place], [facility] Physician recommends and patient chooses bed at  [place], [facility]  Patient to be transferred to [place], [facility] on  02/21/2012 Patient to be transferred to facility by FAMILY  The following physician request were entered in Epic:   Additional Comments: pt has previous  pasarr number  Derenda Fennel, LCSW [phone]

## 2012-03-07 ENCOUNTER — Ambulatory Visit (INDEPENDENT_AMBULATORY_CARE_PROVIDER_SITE_OTHER): Payer: Medicare Other | Admitting: Physician Assistant

## 2012-03-07 ENCOUNTER — Encounter: Payer: Self-pay | Admitting: Physician Assistant

## 2012-03-07 VITALS — BP 124/60 | HR 73 | Ht 64.0 in | Wt 77.8 lb

## 2012-03-07 DIAGNOSIS — I5023 Acute on chronic systolic (congestive) heart failure: Secondary | ICD-10-CM

## 2012-03-07 DIAGNOSIS — R55 Syncope and collapse: Secondary | ICD-10-CM

## 2012-03-07 DIAGNOSIS — I10 Essential (primary) hypertension: Secondary | ICD-10-CM

## 2012-03-07 DIAGNOSIS — I2510 Atherosclerotic heart disease of native coronary artery without angina pectoris: Secondary | ICD-10-CM

## 2012-03-07 DIAGNOSIS — N259 Disorder resulting from impaired renal tubular function, unspecified: Secondary | ICD-10-CM

## 2012-03-07 DIAGNOSIS — I4891 Unspecified atrial fibrillation: Secondary | ICD-10-CM

## 2012-03-07 NOTE — Progress Notes (Signed)
[address] [place], [place]  [address] Phone: [phone] Fax:  [phone]  Date:  03/07/2012   Name:  [name] [name]       DOB:  Apr 08, 1925 MRN:  [id number]  PCP:  Dr. Lysbeth Galas Primary Cardiologist:  Dr. Rollene Rotunda  Primary Electrophysiologist:  Dr. Hillis Range    History of Present Illness: [name] [name] is a 78 y.o. female who presents for post hospital follow up.    She has a history of CAD, status post stenting to the LAD in 1999, s/p CABG in 2004, HTN, HL, atrial fibrillation/flutter, status post multiple DCCVs in the past, ICM.  Last LHC 7/11:  pLAD 80%, mid 95%, distal 40%, mid RI 40%, pRCA 40%, LIMA-LAD patent, SVG-PDA patent, EF 65%.    Admitted 09/2011 with atrial fibrillation with RVR complicated by acute systolic CHF.  She demonstrated signs of sick sinus syndrome with an 8 second pause and underwent pacemaker implantation and was placed on amiodarone.   Echocardiogram 10/01/11: Mild LVH, EF 35-40%, basal inferior, basal to mid posterior and basal to mid anterolateral severe hypokinesis, mild to moderate AI, moderate MR (ischemic MR), moderate LAE, mild RVE, mildly reduced RV systolic function, mild RAE, PASP 43-44.    She was last seen by Dr. Antoine Poche 11/2011 and Dr. Johney Frame 01/2012.  Notes indicate she was in sinus rhythm at that time.  She was recently admitted to [facility] 7/8-7/11 after suffering a syncopal episode.  It was noted that she had fallen several times in the last 6 weeks.  She was seen by Dr. Eden Emms for cardiology.  According to the discharge summary, her pacemaker was interrogated and did not demonstrate any abnormalities.  VQ scan was negative.  Cardiac enzymes were also negative.  There were concerns about her fall risk and her Coumadin was discontinued.  She was felt to be dehydrated and received IV fluids.  She did suffer a left humeral fracture when falling in the setting of syncope and was seen by orthopedics.  Dr. Eden Emms stopped  her amiodarone.  Spironolactone was also discontinued.  She was discharged to a skilled nursing facility.  She is now staying at [facility].  She reports increasing edema in her ankles.  She also reports increasing shortness of breath.  She's probably class III.  She sleeps on several pillows.  She denies PND.  She denies chest pain.  She denies syncope.  She denies any fevers or cough.   Past Medical History  Diagnosis Date  . Coronary artery disease     status post LAD stenting in 1999 after NSTEMI; CABG 2004; Last LHC 7/11: Proximal LAD 80%, mid 95%, distal 40%, mid R. High 40%, proximal RCA 40%, LIMA-LAD patent, SVG-PDA patent, EF 65%.   . Hypertension   . Hyperlipidemia   . Carotid artery disease     status post right carotid endarterectomy.  . Degenerative joint disease   . Restless leg syndrome   . Mild renal insufficiency   . Heparin induced thrombocytopenia   . Diphtheria     "as a child"  . Pleural effusion, bilateral 06/2003  . Pseudoaneurysm     status post repair following catherization in 1999  . Chronic systolic heart failure 10/02/11  . Ischemic cardiomyopathy     Echocardiogram 10/01/11: Mild LVH, EF 35-40%, basal inferior, basal to mid posterior and basal to mid anterolateral severe hypokinesis, mild to moderate AI, moderate MR (ischemic MR), moderate LAE, mild RVE, mildly reduced RV  systolic function, mild RAE, PASP 43-44  . COPD (chronic obstructive pulmonary disease)   . Atrial fibrillation/flutter     s/p multiple DCCVs;  amiodarone started 09/2011, TEE DCCV 3/13;  amio and coumadin d/c'd after admxn to [facility] 02/2012 with frequent falls/syncope  . Tachycardia-bradycardia syndrome     s/p Medtronic pacemaker implant 09/2011 (8 sec pause noted)  . GERD (gastroesophageal reflux disease)   . Renal artery stenosis     bilateral- status post stenting  . Non-functioning kidney   . Depression     "cries alot"  . Gout     Current Outpatient Prescriptions    Medication Sig Dispense Refill  . ALPRAZolam (XANAX) 0.25 MG tablet Take 1 tablet (0.25 mg total) by mouth at bedtime as needed. For sleep  30 tablet  0  . bisacodyl (DULCOLAX) 5 MG EC tablet Take 5 mg by mouth daily as needed. For constipation      . carvedilol (COREG) 12.5 MG tablet Take 1 tablet (12.5 mg total) by mouth 2 (two) times daily with a meal.  120 tablet  3  . Cyanocobalamin (VITAMIN B-12 IJ) Inject 1,000 mcg as directed. Every two weeks.      . furosemide (LASIX) 40 MG tablet Take 20 mg by mouth daily.       Marland Kitchen HYDROcodone-acetaminophen (NORCO) 7.5-325 MG per tablet Take 1-2 tablets by mouth every 4 (four) hours as needed. For pain  30 tablet  0  . nitroGLYCERIN (NITROSTAT) 0.4 MG SL tablet Place 0.4 mg under the tongue every 5 (five) minutes as needed. For chest pain      . omeprazole (PRILOSEC) 20 MG capsule Take 20 mg by mouth at bedtime.       . rosuvastatin (CRESTOR) 5 MG tablet Take 1 tablet (5 mg total) by mouth daily at 6 PM.  90 tablet  2  . Vitamin D, Ergocalciferol, (DRISDOL) 50000 UNITS CAPS Take 50,000 Units by mouth every 7 (seven) days. On Fridays        Allergies: Allergies  Allergen Reactions  . Ramipril Hives  . Contrast Media (Iodinated Diagnostic Agents) Other (See Comments)    Reaction noted by md  . Heparin Other (See Comments)    Clots blood instead of thinning  . Hydromorphone Hcl Other (See Comments)    Crazy feeling  . Ramipril Hives    History  Substance Use Topics  . Smoking status: Never Smoker   . Smokeless tobacco: Never Used  . Alcohol Use: No     ROS:  Please see the history of present illness.    All other systems reviewed and negative.   PHYSICAL EXAM: VS:  BP 124/60  Pulse 73  Ht 5\' 4"  (1.626 m)  Wt 77 lb 12 oz (35.267 kg)  BMI 13.35 kg/m2 Well nourished, well developed, in no acute distress HEENT: normal Neck: minimale JVD At 90 degrees Cardiac:  Normal S1, S2, RRR, no murmur Lungs:  Decreased breath sounds bilaterally,  no wheezing, rhonchi or rales  Abd: soft, nontender  Ext: 1+ bilateral ankle edema Skin: warm and dry Neuro:  CNs 2-12 intact, no focal abnormalities noted  EKG:  AV paced, rate 71  ASSESSMENT AND PLAN:  1.  A/C Systolic CHF She has mild volume overload on exam.  Some of her medications were changed when she was discharged from the hospital. I will put her back on Lasix 40 mg a day.  Check a basic metabolic panel and BNP today.  Check a  basic metabolic panel in one week.  We reviewed low salt diet.  I have asked the nursing home to contact us if her weight increase or she has increasing shortness of breath or swelling.  Follow  with Dr. Antoine Poche in 4 weeks.  2.  Paroxysmal atrial fibrillation Maintaining sinus rhythm.  She was taken off of amiodarone.  I will make sure she has followup with Dr. Johney Frame. She also has had frequent falls and is too high risk for Coumadin.  She will remain off of this for now. Given her coronary disease, I will have aspirin 81 mg daily back to her medical regimen.  3.  Unexplained syncope Likely related to dehydration and poor balance. As noted, the discharge summary indicates her pacemaker was interrogated without any abnormalities found.  4.  CAD No angina.  Continue current therapy.  5.  Hypertension Controlled.  6.  Renal insufficiency I will keep a close eye on her renal function and potassium with the adjustments in her Lasix.  Signed, Tereso Newcomer, PA-C  10:55 AM 03/07/2012

## 2012-03-07 NOTE — Patient Instructions (Addendum)
Your physician recommends that you schedule a follow-up appointment in: 4 WEEKS APPROX WITH DR. HOCHREIN OR SCOTT WEAVER, PAC SAME DAY DR. Antoine Poche IS IN THE OFFICE  Your physician recommends that you schedule a follow-up appointment in: APPROX 2 MONTHS WITH DR. ALLRED DX [phone]   Your physician has recommended you make the following change in your medication: INCREASE LASIX TO 40 MG DAILY; START ASA 81 MG DAILY  PLEASE HAVE THE NURSING HOME GET BMET AND BNP TODAY 03/07/12 WITH RESULTS TO BE FAXED TO [place], PAC [phone]  PLEASE HAVE THE NURSING HOME REPEAT BMET IN 1 WEEK WITH RESULTS FAXED TO [place], P-AC [phone]  2 Gram Low Sodium Diet A 2 gram sodium diet restricts the amount of sodium in the diet to no more than 2 g or 2000 mg daily. Limiting the amount of sodium is often used to help lower blood pressure. It is important if you have heart, liver, or kidney problems. Many foods contain sodium for flavor and sometimes as a preservative. When the amount of sodium in a diet needs to be low, it is important to know what to look for when choosing foods and drinks. The following includes some information and guidelines to help make it easier for you to adapt to a low sodium diet. QUICK TIPS  Do not add salt to food.   Avoid convenience items and fast food.   Choose unsalted snack foods.   Buy lower sodium products, often labeled as "lower sodium" or "no salt added."   Check food labels to learn how much sodium is in 1 serving.   When eating at a restaurant, ask that your food be prepared with less salt or none, if possible.  READING FOOD LABELS FOR SODIUM INFORMATION The nutrition facts label is a good place to find how much sodium is in foods. Look for products with no more than 500 to 600 mg of sodium per meal and no more than 150 mg per serving. Remember that 2 g = 2000 mg. The food label may also list foods as:  Sodium-free: Less than 5 mg in a serving.   Very  low sodium: 35 mg or less in a serving.   Low-sodium: 140 mg or less in a serving.   Light in sodium: 50% less sodium in a serving. For example, if a food that usually has 300 mg of sodium is changed to become light in sodium, it will have 150 mg of sodium.   Reduced sodium: 25% less sodium in a serving. For example, if a food that usually has 400 mg of sodium is changed to reduced sodium, it will have 300 mg of sodium.  CHOOSING FOODS Grains  Avoid: Salted crackers and snack items. Some cereals, including instant hot cereals. Bread stuffing and biscuit mixes. Seasoned rice or pasta mixes.   Choose: Unsalted snack items. Low-sodium cereals, oats, puffed wheat and rice, shredded wheat. English muffins and bread. Pasta.  Meats  Avoid: Salted, canned, smoked, spiced, pickled meats, including fish and poultry. Bacon, ham, sausage, cold cuts, hot dogs, anchovies.   Choose: Low-sodium canned tuna and salmon. Fresh or frozen meat, poultry, and fish.  Dairy  Avoid: Processed cheese and spreads. Cottage cheese. Buttermilk and condensed milk. Regular cheese.   Choose: Milk. Low-sodium cottage cheese. Yogurt. Sour cream. Low-sodium cheese.  Fruits and Vegetables  Avoid: Regular canned vegetables. Regular canned tomato sauce and paste. Frozen vegetables in sauces. Olives. Rosita Fire. Relishes. Sauerkraut.   Choose: Low-sodium canned  vegetables. Low-sodium tomato sauce and paste. Frozen or fresh vegetables. Fresh and frozen fruit.  Condiments  Avoid: Canned and packaged gravies. Worcestershire sauce. Tartar sauce. Barbecue sauce. Soy sauce. Steak sauce. Ketchup. Onion, garlic, and table salt. Meat flavorings and tenderizers.   Choose: Fresh and dried herbs and spices. Low-sodium varieties of mustard and ketchup. Lemon juice. Tabasco sauce. Horseradish.  SAMPLE 2 GRAM SODIUM MEAL PLAN Breakfast / Sodium (mg)  1 cup low-fat milk / 143 mg   2 slices whole-wheat toast / 270 mg   1 tbs  heart-healthy margarine / 153 mg   1 hard-boiled egg / 139 mg   1 small orange / 0 mg  Lunch / Sodium (mg)  1 cup raw carrots / 76 mg    cup hummus / 298 mg   1 cup low-fat milk / 143 mg    cup red grapes / 2 mg   1 whole-wheat pita bread / 356 mg  Dinner / Sodium (mg)  1 cup whole-wheat pasta / 2 mg   1 cup low-sodium tomato sauce / 73 mg   3 oz lean ground beef / 57 mg   1 small side salad (1 cup raw spinach leaves,  cup cucumber,  cup yellow bell pepper) with 1 tsp olive oil and 1 tsp red wine vinegar / 25 mg  Snack / Sodium (mg)  1 container low-fat vanilla yogurt / 107 mg   3 graham cracker squares / 127 mg  Nutrient Analysis  Calories: 2033   Protein: 77 g   Carbohydrate: 282 g   Fat: 72 g   Sodium: 1971 mg  Document Released: 07/30/2005 Document Revised: 07/19/2011 Document Reviewed: 10/31/2009 [employer] Patient Information 2012 [place], [place].

## 2012-03-10 ENCOUNTER — Telehealth: Payer: Self-pay | Admitting: Physician Assistant

## 2012-03-10 ENCOUNTER — Encounter: Payer: Self-pay | Admitting: Physician Assistant

## 2012-03-10 DIAGNOSIS — I4891 Unspecified atrial fibrillation: Secondary | ICD-10-CM

## 2012-03-10 MED ORDER — AMIODARONE HCL 100 MG PO TABS: 100.0000 mg | ORAL_TABLET | Freq: Every day | ORAL | Status: DC

## 2012-03-10 NOTE — Telephone Encounter (Signed)
s/w pt's daughter Elita Quick, who states pt is in [facility]. I called [facility] and gave verbal order that pt is to start back on Amiodarone 100 mg daily per [employer]. PAC and Dr. Johney Frame, RN gave verbal understanding today

## 2012-03-10 NOTE — Addendum Note (Signed)
Addended by: Tarri Fuller on: 03/10/2012 06:06 PM   Modules accepted: Orders

## 2012-03-10 NOTE — Telephone Encounter (Signed)
D/w Dr. Antoine Poche and Dr. Johney Frame. Dr. Johney Frame suggested trying her back on Amiodarone at a lower dose to try and maintain sinus rhythm. Looks like she went into acute heart failure when she was in AFib in the past.  So, have her restart Amiodarone at 100 mg daily. Keep follow up visits with Dr. Antoine Poche and Dr. Johney Frame as planned. Tereso Newcomer, PA-C  11:03 AM 03/10/2012

## 2012-03-12 NOTE — Telephone Encounter (Signed)
s/w [facility] Lupita Leash [phone], I will fax lab results to [facility] today and [facility] will get repeat bmet 8/5 w/results to be faxed to [facility]. PAC

## 2012-05-06 ENCOUNTER — Telehealth: Payer: Self-pay | Admitting: Cardiology

## 2012-05-06 NOTE — Telephone Encounter (Signed)
Per Byrd Hesselbach at [facility] - pt has had a three lb weight gain and has facial edema.  She was to have returned in 3 weeks however no appt was made for her and appt did not occur.  Appt scheduled for 10/3 at 3:20 pm with Tereso Newcomer, PA   Pt is to be seen today by Dr Benedetto Goad and will have at [facility] stat today.  Pt has been on 40 mg of Lasix every am with 20 mg every afternoon added 9/3 due to continued weight gain (119lbs).  She is not on a potassium supplement.  Byrd Hesselbach will call back with concerns if necessary.

## 2012-05-06 NOTE — Telephone Encounter (Signed)
New Problem:    Called in because the patient has had a facial edema, nausea and a three pound weight gain.  Please call back.

## 2012-05-15 ENCOUNTER — Ambulatory Visit: Payer: Medicare Other | Admitting: Physician Assistant

## 2012-05-16 ENCOUNTER — Ambulatory Visit (INDEPENDENT_AMBULATORY_CARE_PROVIDER_SITE_OTHER): Payer: Medicare (Managed Care) | Admitting: Nurse Practitioner

## 2012-05-16 ENCOUNTER — Ambulatory Visit: Payer: Medicare Other

## 2012-05-16 ENCOUNTER — Encounter: Payer: Self-pay | Admitting: Nurse Practitioner

## 2012-05-16 VITALS — BP 124/66 | HR 82 | Ht 64.0 in | Wt 116.4 lb

## 2012-05-16 DIAGNOSIS — R5381 Other malaise: Secondary | ICD-10-CM

## 2012-05-16 DIAGNOSIS — N390 Urinary tract infection, site not specified: Secondary | ICD-10-CM

## 2012-05-16 DIAGNOSIS — R531 Weakness: Secondary | ICD-10-CM

## 2012-05-16 DIAGNOSIS — Z79899 Other long term (current) drug therapy: Secondary | ICD-10-CM

## 2012-05-16 DIAGNOSIS — D649 Anemia, unspecified: Secondary | ICD-10-CM

## 2012-05-16 LAB — BASIC METABOLIC PANEL
BUN: 31 mg/dL — ABNORMAL HIGH (ref 6–23)
CO2: 26 mEq/L (ref 19–32)
Calcium: 8.7 mg/dL (ref 8.4–10.5)
Chloride: 96 mEq/L (ref 96–112)
Creatinine, Ser: 1.8 mg/dL — ABNORMAL HIGH (ref 0.4–1.2)
GFR: 29.18 mL/min — ABNORMAL LOW (ref >60.00)
Glucose, Bld: 105 mg/dL — ABNORMAL HIGH (ref 70–99)
Potassium: 4.6 mEq/L (ref 3.5–5.1)
Sodium: 132 mEq/L — ABNORMAL LOW (ref 135–145)

## 2012-05-16 LAB — CBC WITH DIFFERENTIAL/PLATELET
Basophils Absolute: 0.1 10*3/uL (ref 0.0–0.1)
Basophils Relative: 1.4 % (ref 0.0–3.0)
Eosinophils Absolute: 0 10*3/uL (ref 0.0–0.7)
Eosinophils Relative: 0.5 % (ref 0.0–5.0)
HCT: 31 % — ABNORMAL LOW (ref 36.0–46.0)
Hemoglobin: 9.6 g/dL — ABNORMAL LOW (ref 12.0–15.0)
Lymphocytes Relative: 19.3 % (ref 12.0–46.0)
Lymphs Abs: 1.2 10*3/uL (ref 0.7–4.0)
MCHC: 30.9 g/dL (ref 30.0–36.0)
MCV: 85.2 fl (ref 78.0–100.0)
Monocytes Absolute: 0.7 10*3/uL (ref 0.1–1.0)
Monocytes Relative: 11.4 % (ref 3.0–12.0)
Neutro Abs: 4 10*3/uL (ref 1.4–7.7)
Neutrophils Relative %: 67.4 % (ref 43.0–77.0)
Platelets: 260 10*3/uL (ref 150.0–400.0)
RBC: 3.64 Mil/uL — ABNORMAL LOW (ref 3.87–5.11)
RDW: 18.2 % — ABNORMAL HIGH (ref 11.5–14.6)
WBC: 6 10*3/uL (ref 4.5–10.5)

## 2012-05-16 LAB — URINALYSIS WITH CULTURE, IF INDICATED
Bilirubin Urine: NEGATIVE
Ketones, ur: NEGATIVE
Nitrite: POSITIVE
Specific Gravity, Urine: 1.01 (ref 1.000–1.030)
Total Protein, Urine: NEGATIVE
Urine Glucose: NEGATIVE
Urobilinogen, UA: 0.2 (ref 0.0–1.0)
pH: 8 (ref 5.0–8.0)

## 2012-05-16 LAB — FERRITIN: Ferritin: 34.8 ng/mL (ref 10.0–291.0)

## 2012-05-16 LAB — TSH: TSH: 11.46 u[IU]/mL — ABNORMAL HIGH (ref 0.35–5.50)

## 2012-05-16 LAB — IRON: Iron: 38 ug/dL — ABNORMAL LOW (ref 42–145)

## 2012-05-16 NOTE — Progress Notes (Signed)
[name] Date of Birth: 03-27-1925 Medical Record #[id number]  History of Present Illness: [name] [name] is seen today for a work in visit. She is seen for Dr. Antoine Poche. She has a history of CAD, status post stenting to the LAD in 1999, s/p CABG in 2004, HTN, HL, atrial fibrillation/flutter, status post multiple DCCVs in the past, ICM. Last LHC 7/11: pLAD 80%, mid 95%, distal 40%, mid RI 40%, pRCA 40%, LIMA-LAD patent, SVG-PDA patent, EF 65%.   Admitted 09/2011 with atrial fibrillation with RVR complicated by acute systolic CHF. She demonstrated signs of sick sinus syndrome with an 8 second pause and underwent pacemaker implantation and was placed on amiodarone. Echocardiogram 10/01/11: Mild LVH, EF 35-40%, basal inferior, basal to mid posterior and basal to mid anterolateral severe hypokinesis, mild to moderate AI, moderate MR (ischemic MR), moderate LAE, mild RVE, mildly reduced RV systolic function, mild RAE, PASP 43-44. She has been taken off of coumadin due to falls. She has had a prior left humeral fracture from a fall/syncopal episode back in the summer. She is back on low dose amiodarone. She is residing at [facility] for rehab.   She comes in today. She is here with an aide and her daughter is present. Her daughter provides most of the history. Says her mom is weak and has no energy. Has had some extra swelling and is on more Lasix. No chest pain. Patient seems more bothered by her shoulder pain. She has some shortness of breath. Apparently is eating lots of ice. Was anemic back in the summer. No labs sent with her today. She is pretty sedentary and lays in the bed most of the time. Has had recurrent UTI's.   Current Outpatient Prescriptions on File Prior to Visit  Medication Sig Dispense Refill  . ALPRAZolam (XANAX) 0.25 MG tablet Take 1 tablet (0.25 mg total) by mouth at bedtime as needed. For sleep  30 tablet  0  . amiodarone (PACERONE) 100 MG tablet Take 1 tablet (100 mg total)  by mouth daily.      Marland Kitchen aspirin EC 81 MG tablet Take 1 tablet (81 mg total) by mouth daily.      . bisacodyl (DULCOLAX) 5 MG EC tablet Take 5 mg by mouth daily as needed. For constipation      . carvedilol (COREG) 12.5 MG tablet Take 1 tablet (12.5 mg total) by mouth 2 (two) times daily with a meal.  120 tablet  3  . Cyanocobalamin (VITAMIN B-12 IJ) Inject 1,000 mcg as directed. Every two weeks.      . furosemide (LASIX) 40 MG tablet Take 40 mg by mouth as directed. TAKE 40 MG IN THE AM AND 20 MG IN THE AFTERNOON      . HYDROcodone-acetaminophen (NORCO) 7.5-325 MG per tablet Take 1-2 tablets by mouth every 4 (four) hours as needed. For pain  30 tablet  0  . levothyroxine (SYNTHROID, LEVOTHROID) 25 MCG tablet Take 25 mcg by mouth daily.      . meclizine (ANTIVERT) 12.5 MG tablet Take 12.5 mg by mouth as needed.      . nitroGLYCERIN (NITROSTAT) 0.4 MG SL tablet Place 0.4 mg under the tongue every 5 (five) minutes as needed. For chest pain      . omeprazole (PRILOSEC) 20 MG capsule Take 20 mg by mouth at bedtime.       . rosuvastatin (CRESTOR) 5 MG tablet Take 1 tablet (5 mg total) by mouth daily at 6 PM.  90 tablet  2    Allergies  Allergen Reactions  . Ramipril Hives  . Contrast Media (Iodinated Diagnostic Agents) Other (See Comments)    Reaction noted by md  . Heparin Other (See Comments)    Clots blood instead of thinning  . Hydromorphone Hcl Other (See Comments)    Crazy feeling    Past Medical History  Diagnosis Date  . Coronary artery disease     status post LAD stenting in 1999 after NSTEMI; CABG 2004; Last LHC 7/11: Proximal LAD 80%, mid 95%, distal 40%, mid R. High 40%, proximal RCA 40%, LIMA-LAD patent, SVG-PDA patent, EF 65%.   . Hypertension   . Hyperlipidemia   . Carotid artery disease     status post right carotid endarterectomy.  . Degenerative joint disease   . Restless leg syndrome   . Mild renal insufficiency   . Heparin induced thrombocytopenia   . Diphtheria      "as a child"  . Pleural effusion, bilateral 06/2003  . Pseudoaneurysm     status post repair following catherization in 1999  . Chronic systolic heart failure 10/02/11  . Ischemic cardiomyopathy     Echocardiogram 10/01/11: Mild LVH, EF 35-40%, basal inferior, basal to mid posterior and basal to mid anterolateral severe hypokinesis, mild to moderate AI, moderate MR (ischemic MR), moderate LAE, mild RVE, mildly reduced RV systolic function, mild RAE, PASP 43-44  . COPD (chronic obstructive pulmonary disease)   . Atrial fibrillation/flutter     s/p multiple DCCVs;  amiodarone started 09/2011, TEE DCCV 3/13;  amio and coumadin d/c'd after admxn to [facility] 02/2012 with frequent falls/syncope  . Tachycardia-bradycardia syndrome     s/p Medtronic pacemaker implant 09/2011 (8 sec pause noted)  . GERD (gastroesophageal reflux disease)   . Renal artery stenosis     bilateral- status post stenting  . Non-functioning kidney   . Depression     "cries alot"  . Gout     Past Surgical History  Procedure Date  . Carotid endarterectomy 05/2003    right  . Pacemaker insertion 10/02/11    implanted by Dr Johney Frame  . Cholecystectomy ?02/2011  . Tonsillectomy and adenoidectomy   . Renal artery stent 06/2003    bilaterally/e-chart  . Thoracentesis ? 2000; 05/2011  . Coronary angioplasty with stent placement 1999  . Av fistula repair 01/1998    w/pseudoaneurysm repair S/P catheterization/E-chart  . Coronary artery bypass graft 05/2003    CABG X 3  . Tee without cardioversion 10/23/2011    Procedure: TRANSESOPHAGEAL ECHOCARDIOGRAM (TEE);  Surgeon: Wendall Stade, MD;  Location[facility] ENDOSCOPY;  Service: Cardiovascular;  Laterality: N/A;  . Cardioversion 10/23/2011    Procedure: CARDIOVERSION;  Surgeon: Wendall Stade, MD;  Location: [facility] ENDOSCOPY;  Service: Cardiovascular;  Laterality: N/A;    History  Smoking status  . Never Smoker   Smokeless tobacco  . Never Used    History  Alcohol Use No    Family  History  Problem Relation Age of Onset  . Cancer Mother 17    died  . Lung disease Father 12    died  . Heart disease Sister     2 sisters and her son  . Anesthesia problems Neg Hx     Review of Systems: The review of systems is per the HPI.  All other systems were reviewed and are negative.  Physical Exam: BP 124/66  Pulse 82  Ht 5\' 4"  (1.626 m)  Wt 116 lb  6.4 oz (52.799 kg)  BMI 19.98 kg/m2 Patient is an elderly female who looks chronically ill. She is in a wheelchair. She is a little confused. She is in no acute distress. Skin is warm and dry. Color is normal.  HEENT is unremarkable. Normocephalic/atraumatic. PERRL. Sclera are nonicteric. Neck is supple. No masses. No JVD. Lungs are clear. Cardiac exam shows a regular rate and rhythm. Abdomen is soft. Extremities are with trace edema. Gait is not tested today. ROM appears intact. No gross neurologic deficits noted.  LABORATORY DATA: PENDING FOR TODAY.  Study Conclusions  - Left ventricle: The cavity size was normal. Wall thickness was increased in a pattern of mild LVH. Systolic function was moderately reduced. The estimated ejection fraction was in the range of 35% to 40%. Basal inferior, basal to mid posterior, and basal to mid anterolateral severe hypokinesis. - Aortic valve: There was no stenosis. Mild to moderate regurgitation. - Mitral valve: Moderate regurgitation. Posterior leaflet restriction by lateral wall motion abnormality (ischemic MR). - Left atrium: The atrium was moderately dilated. - Right ventricle: The cavity size was mildly dilated. Systolic function was mildly reduced. - Right atrium: The atrium was mildly dilated. - Tricuspid valve: Moderate regurgitation. Peak RV-RA gradient: 37mm Hg (S). - Pulmonary arteries: PA systolic pressure 43-47 mmHg. - Systemic veins: IVC measured 1.9 cm with normal respirophasic variation, suggesting RA pressure 6-10 mmHg.  Lab Results  Component Value Date   WBC  9.9 02/20/2012   HGB 10.1* 02/20/2012   HCT 31.1* 02/20/2012   PLT 221 02/20/2012   GLUCOSE 91 02/21/2012   CHOL 82 10/01/2011   TRIG 88 10/01/2011   HDL 26* 10/01/2011   LDLCALC 38 10/01/2011   ALT 10 02/20/2012   AST 14 02/20/2012   NA 131* 02/21/2012   K 4.5 02/21/2012   CL 99 02/21/2012   CREATININE 1.66* 02/21/2012   BUN 37* 02/21/2012   CO2 25 02/21/2012   TSH 5.240* 02/19/2012   INR 2.57* 02/21/2012   HGBA1C 6.2* 09/30/2011    Assessment / Plan: 1. Generalized failure to thrive.   2. Ischemic CM - has an EF of 35 to 40% per echo earlier this year. On higher doses of Lasix due to weight gain. Does like salt. Family is not sure if they want to restrict her diet given that she is 86.   3. Anemia - apparently ingesting lots of ice  4. PAF - on amiodarone  5. PTVP  We will check some labs today. Will check iron levels. She may do better if her anemia was treated. She does appear fairly debilitated and there may not be much to offer. We will see if we can make changes based on her labs. I do not think she needs further testing at this time. She is to see Dr. Antoine Poche in December. For now, I have left her on her current regimen.   Patient and her daughter are agreeable to this plan and will call if any problems develop in the interim.

## 2012-05-16 NOTE — Patient Instructions (Addendum)
We are going to recheck your labs today  For now, stay on the current medicines  Try to elevate your legs when out of bed  Try to minimize salt  Call the [facility] office at [phone] if you have any questions, problems or concerns.

## 2012-05-19 LAB — URINE CULTURE: Colony Count: >=100000

## 2012-05-20 ENCOUNTER — Telehealth: Payer: Self-pay | Admitting: Cardiology

## 2012-05-20 NOTE — Telephone Encounter (Signed)
New problem:  patient was Rx Cipro. Need to discuss  drug interaction.

## 2012-05-20 NOTE — Telephone Encounter (Signed)
Pt on amiodarone - will review with Norma Fredrickson, NP and call back with orders.

## 2012-05-20 NOTE — Telephone Encounter (Signed)
Pt to be changed to Septra DS one BID for 7 days per Norma Fredrickson, NP - Kathie Rhodes aware - states MD there will write the order

## 2012-05-23 ENCOUNTER — Telehealth: Payer: Self-pay | Admitting: Cardiology

## 2012-05-23 NOTE — Telephone Encounter (Signed)
DISCUSSED MESSAGE  WITH [name] PT IS LIVING IN [facility]  PT IS UP 3.5  LBS   EDEMA NOTED   TO  FEET  NO SOB   DISCUSSED ABOVE WITH DR HOCHREIN PER DR HOCHREIN   INCREASE LASIX  WITH TAKING AN EXTRA 40 MG TIMES 2 DAYS THEN RESUME TO  40 MG IN AM  AND 20 MG IN PM AND  RECHECK BMET  IN WEEK [name] AWARE OF TREATMENT PLAN .[name] [name]

## 2012-05-23 NOTE — Telephone Encounter (Signed)
New Problem:    Called in wanting to speak with you about the patient's weight.  Yesterday she was 113.7 and today she is 117.2 . Please call back and ask for [facility].

## 2012-05-27 ENCOUNTER — Telehealth: Payer: Self-pay | Admitting: Cardiology

## 2012-05-27 NOTE — Telephone Encounter (Signed)
Kathie Rhodes aware and will call with her wt tomorrow

## 2012-05-27 NOTE — Telephone Encounter (Signed)
Dr Antoine Poche aware of 5 lb weight gain

## 2012-05-27 NOTE — Telephone Encounter (Signed)
Plz return call to [facility] [phone] regarding pt's 5 lb weight gain since yesterday.

## 2012-05-27 NOTE — Telephone Encounter (Signed)
If she is not having any acute SOB I would prefer to wait until tomorrow to check another weight.  She has CKD and I do not want to over diurese.

## 2012-05-27 NOTE — Telephone Encounter (Signed)
Pt's wt usually remains around 113 lbs.  Today her weight is 118 lbs.  She has taken extra Furosemide 40 mg BID for 2 days (12 and 13th)  On the 14th and today she has taken 40 mg and 20 mg.  Will review with MD and call back

## 2012-05-27 NOTE — Telephone Encounter (Signed)
Reviewed with Dr Antoine Poche.  He does not want to make any changes at this time.  He would like the patient to be weighed in the am

## 2012-05-28 NOTE — Telephone Encounter (Signed)
If she remains asymptomatic no additional diuretic is needed.

## 2012-05-28 NOTE — Telephone Encounter (Signed)
Wt today was 118.6  Unchanged

## 2012-05-29 ENCOUNTER — Encounter: Payer: Self-pay | Admitting: Cardiology

## 2012-05-29 ENCOUNTER — Telehealth: Payer: Self-pay | Admitting: Cardiology

## 2012-05-29 NOTE — Telephone Encounter (Signed)
Pt coming in for evaluation 10/18 and 3pm with Dr Antoine Poche

## 2012-05-29 NOTE — Telephone Encounter (Signed)
Per Kathie Rhodes - pts wt is 119.5 and she has edema in face and feet   No SOB  Will review with Dr Antoine Poche

## 2012-05-29 NOTE — Telephone Encounter (Signed)
Wt today 113.8  lbs

## 2012-05-29 NOTE — Telephone Encounter (Signed)
New problem:  Weight today 119 . 5  Edema in face & feet.

## 2012-05-30 ENCOUNTER — Encounter: Payer: Self-pay | Admitting: Cardiology

## 2012-05-30 ENCOUNTER — Ambulatory Visit (INDEPENDENT_AMBULATORY_CARE_PROVIDER_SITE_OTHER): Payer: Medicare (Managed Care) | Admitting: Cardiology

## 2012-05-30 VITALS — BP 115/47 | HR 47 | Ht 64.0 in | Wt 119.0 lb

## 2012-05-30 DIAGNOSIS — I4891 Unspecified atrial fibrillation: Secondary | ICD-10-CM

## 2012-05-30 NOTE — Progress Notes (Signed)
HPI The patient was added to my schedule because of weight gain. We've been getting frequent calls from her nursing home staff reporting weight gains of perhaps 6 or 7 pounds. It was somewhat difficult to clarify and verify this. The patient has been in a nursing home since discharge from the hospital this summer. She has had progressive renal insufficiency and I note that blood work done as an outpatient recently included a BUN of 41 with a creatinine of 2.64. This is up from blood work that we did our office earlier this month with a creatinine of 1.88.  The patient just doesn't feel well. Her family reports progressive weakness. She is in particular focused on left shoulder discomfort. This has in the past responded to steroid injections. She is awaiting one of these. She says she is quite uncomfortable with this. She's had increased edema. However, her weight increase here is only 3 pounds from her previous. She's not describing PND or orthopnea. She's not describing chest pressure, neck or arm discomfort. She's not noticing any palpitations. Unfortunately she's not been able to do much more than stand or transfer with progressive weakness.   Allergies  Allergen Reactions  . Ramipril Hives  . Altace (Ramipril) Hives  . Contrast Media (Iodinated Diagnostic Agents) Other (See Comments)    Reaction noted by md  . Heparin Other (See Comments)    Clots blood instead of thinning  . Hydromorphone Hcl Other (See Comments)    Crazy feeling    Current Outpatient Prescriptions  Medication Sig Dispense Refill  . ALPRAZolam (XANAX) 0.25 MG tablet Take 1 tablet (0.25 mg total) by mouth at bedtime as needed. For sleep  30 tablet  0  . amiodarone (PACERONE) 100 MG tablet Take 100 mg by mouth daily. 1/2 tab daily      . aspirin EC 81 MG tablet Take 1 tablet (81 mg total) by mouth daily.      . bisacodyl (DULCOLAX) 5 MG EC tablet Take 5 mg by mouth daily as needed. For constipation      . carvedilol  (COREG) 12.5 MG tablet Take 1 tablet (12.5 mg total) by mouth 2 (two) times daily with a meal.  120 tablet  3  . Cyanocobalamin (VITAMIN B-12 IJ) Inject 1,000 mcg as directed. Every two weeks.      Marland Kitchen diltiazem (CARDIZEM CD) 120 MG 24 hr capsule Take 120 mg by mouth daily.      . furosemide (LASIX) 40 MG tablet Take 40 mg by mouth as directed. TAKE 40 MG IN THE AM AND 20 MG IN THE AFTERNOON      . HYDROcodone-acetaminophen (NORCO) 7.5-325 MG per tablet Take 1-2 tablets by mouth every 4 (four) hours as needed. For pain  30 tablet  0  . IRON CR PO Take 324 mg by mouth 2 (two) times daily.      Marland Kitchen levothyroxine (SYNTHROID, LEVOTHROID) 50 MCG tablet Take 50 mcg by mouth daily.      Marland Kitchen lubiprostone (AMITIZA) 24 MCG capsule Take 24 mcg by mouth 2 (two) times daily with a meal.      . meclizine (ANTIVERT) 12.5 MG tablet Take 12.5 mg by mouth as needed.      . methylPREDNISolone acetate (DEPO-MEDROL) 80 MG/ML injection Inject 80 mg into the muscle once.      . nitroGLYCERIN (NITROSTAT) 0.4 MG SL tablet Place 0.4 mg under the tongue every 5 (five) minutes as needed. For chest pain      .  omeprazole (PRILOSEC) 20 MG capsule Take 20 mg by mouth at bedtime.       . polyethylene glycol powder (GLYCOLAX/MIRALAX) powder Take 17 g by mouth daily.      . rosuvastatin (CRESTOR) 5 MG tablet Take 1 tablet (5 mg total) by mouth daily at 6 PM.  90 tablet  2  . Sulfamethoxazole-Trimethoprim (SEPTRA PO) Take by mouth 2 (two) times daily.      . vitamin C (ASCORBIC ACID) 250 MG tablet Take 250 mg by mouth daily.      . Vitamin D, Ergocalciferol, (DRISDOL) 50000 UNITS CAPS Take 50,000 Units by mouth every 30 (thirty) days.      Marland Kitchen DISCONTD: amiodarone (PACERONE) 100 MG tablet Take 1 tablet (100 mg total) by mouth daily.        Past Medical History  Diagnosis Date  . Coronary artery disease     status post LAD stenting in 1999 after NSTEMI; CABG 2004; Last LHC 7/11: Proximal LAD 80%, mid 95%, distal 40%, mid R. High 40%,  proximal RCA 40%, LIMA-LAD patent, SVG-PDA patent, EF 65%.   . Hypertension   . Hyperlipidemia   . Carotid artery disease     status post right carotid endarterectomy.  . Degenerative joint disease   . Restless leg syndrome   . Mild renal insufficiency   . Heparin induced thrombocytopenia   . Diphtheria     "as a child"  . Pleural effusion, bilateral 06/2003  . Pseudoaneurysm     status post repair following catherization in 1999  . Chronic systolic heart failure 10/02/11  . Ischemic cardiomyopathy     Echocardiogram 10/01/11: Mild LVH, EF 35-40%, basal inferior, basal to mid posterior and basal to mid anterolateral severe hypokinesis, mild to moderate AI, moderate MR (ischemic MR), moderate LAE, mild RVE, mildly reduced RV systolic function, mild RAE, PASP 43-44  . COPD (chronic obstructive pulmonary disease)   . Atrial fibrillation/flutter     s/p multiple DCCVs;  amiodarone started 09/2011, TEE DCCV 3/13;  amio and coumadin d/c'd after admxn to [facility] 02/2012 with frequent falls/syncope  . Tachycardia-bradycardia syndrome     s/p Medtronic pacemaker implant 09/2011 (8 sec pause noted)  . GERD (gastroesophageal reflux disease)   . Renal artery stenosis     bilateral- status post stenting  . Non-functioning kidney   . Depression     "cries alot"  . Gout     Past Surgical History  Procedure Date  . Carotid endarterectomy 05/2003    right  . Pacemaker insertion 10/02/11    implanted by Dr Johney Frame  . Cholecystectomy ?02/2011  . Tonsillectomy and adenoidectomy   . Renal artery stent 06/2003    bilaterally/e-chart  . Thoracentesis ? 2000; 05/2011  . Coronary angioplasty with stent placement 1999  . Av fistula repair 01/1998    w/pseudoaneurysm repair S/P catheterization/E-chart  . Coronary artery bypass graft 05/2003    CABG X 3  . Tee without cardioversion 10/23/2011    Procedure: TRANSESOPHAGEAL ECHOCARDIOGRAM (TEE);  Surgeon: Wendall Stade, MD;  Location: [facility] ENDOSCOPY;  Service:  Cardiovascular;  Laterality: N/A;  . Cardioversion 10/23/2011    Procedure: CARDIOVERSION;  Surgeon: Wendall Stade, MD;  Location: [facility] ENDOSCOPY;  Service: Cardiovascular;  Laterality: N/A;    ROS:  Otherwise as stated in the HPI and negative for all other systems.  PHYSICAL EXAM BP 115/47  Pulse 47  Ht 5\' 4"  (1.626 m)  Wt 119 lb (53.978 kg)  BMI 20.43 kg/m2  PHYSICAL EXAM GEN:  Frail distress NECK:  No jugular venous distention at 90 degrees, waveform within normal limits, carotid upstroke brisk and symmetric, no bruits, no thyromegaly LYMPHATICS:  No cervical adenopathy LUNGS:  Clear to auscultation bilaterally BACK:  No CVA tenderness CHEST:  Unremarkable HEART:  S1 and S2 within normal limits, no S3, no S4, no clicks, no rubs, no murmurs ABD:  Positive bowel sounds normal in frequency in pitch, no bruits, no rebound, no guarding, unable to assess midline mass or bruit with the patient seated. EXT:  2 plus pulses throughout, moderate diffuse extremity edema, no cyanosis no clubbing SKIN:  No rashes no nodules NEURO:  Cranial nerves II through XII grossly intact, motor grossly intact throughout PSYCH:  Cognitively intact, oriented to person place and time  EKG:  AV paced rate 47.  No acute ST T wave changes.  05/30/2012  ASSESSMENT AND PLAN  Failure to thrive - I did speak with Dr. Benedetto Goad today. The patient is generally failing to thrive over daughters do want measures to include hospitalization it would help. I do think that many of her symptoms and complaints are coming from her shoulder. Dr. Andrey Campanile was intending to inject this today. We can then see how she feels with this. He agrees that if she is still doing poorly early next week when he sees her and in particular if her creatinine is elevated or her weight continues to go up I would be happy to admit her for IV diuresis and possible inotropic therapy.  Cardiomyopathy - The patient has had an ejection fraction of about  35%. Her volume overload currently seems to be more right-sided. However, she is somewhat uncomfortable with this. For further diuresis I think she would need to be hospitalized for inotropic therapy and close followup of her kidney function. I will discuss this early next week with Dr. Andrey Campanile.  RENAL INSUFFICIENCY -  This has been a progressive problem. Again Dr. Andrey Campanile will check her labs early next week and we will confer about the need for hospitalization.  Atrial fibrillation with RVR -  She seems to be maintaining sinus rhythm. She will continue the medications as listed I will make sure she has appropriate amiodarone followup.  CAD -  The patient has no new sypmtoms. No further cardiovascular testing is indicated. We will continue with aggressive risk reduction and meds as listed.   HYPERTENSION -  Her blood pressure is slightly low.   If this continues we may need to back off on some of her medications.

## 2012-05-30 NOTE — Patient Instructions (Addendum)
Your physician recommends that you continue on your current medications as directed. Please refer to the Current Medication list given to you today.     

## 2012-06-02 ENCOUNTER — Encounter: Payer: Self-pay | Admitting: Cardiology

## 2012-06-03 ENCOUNTER — Telehealth: Payer: Self-pay | Admitting: Cardiology

## 2012-06-03 ENCOUNTER — Inpatient Hospital Stay (HOSPITAL_COMMUNITY): Payer: Medicare Other

## 2012-06-03 ENCOUNTER — Inpatient Hospital Stay (HOSPITAL_COMMUNITY)
Admission: AD | Admit: 2012-06-03 | Discharge: 2012-06-09 | DRG: 292 | Disposition: A | Discharge status: Skilled Nursing Facility | Payer: Medicare Other | Source: Ambulatory Visit | Attending: Cardiology | Admitting: Cardiology

## 2012-06-03 DIAGNOSIS — N390 Urinary tract infection, site not specified: Secondary | ICD-10-CM | POA: Diagnosis present

## 2012-06-03 DIAGNOSIS — I4892 Unspecified atrial flutter: Secondary | ICD-10-CM | POA: Diagnosis present

## 2012-06-03 DIAGNOSIS — Z950 Presence of cardiac pacemaker: Secondary | ICD-10-CM | POA: Diagnosis present

## 2012-06-03 DIAGNOSIS — Z79899 Other long term (current) drug therapy: Secondary | ICD-10-CM

## 2012-06-03 DIAGNOSIS — I10 Essential (primary) hypertension: Secondary | ICD-10-CM | POA: Diagnosis present

## 2012-06-03 DIAGNOSIS — J449 Chronic obstructive pulmonary disease, unspecified: Secondary | ICD-10-CM | POA: Diagnosis present

## 2012-06-03 DIAGNOSIS — Z951 Presence of aortocoronary bypass graft: Secondary | ICD-10-CM

## 2012-06-03 DIAGNOSIS — J4489 Other specified chronic obstructive pulmonary disease: Secondary | ICD-10-CM | POA: Diagnosis present

## 2012-06-03 DIAGNOSIS — I2510 Atherosclerotic heart disease of native coronary artery without angina pectoris: Secondary | ICD-10-CM | POA: Diagnosis present

## 2012-06-03 DIAGNOSIS — E039 Hypothyroidism, unspecified: Secondary | ICD-10-CM | POA: Diagnosis present

## 2012-06-03 DIAGNOSIS — I5043 Acute on chronic combined systolic (congestive) and diastolic (congestive) heart failure: Secondary | ICD-10-CM | POA: Diagnosis present

## 2012-06-03 DIAGNOSIS — F3289 Other specified depressive episodes: Secondary | ICD-10-CM | POA: Diagnosis present

## 2012-06-03 DIAGNOSIS — M1990 Unspecified osteoarthritis, unspecified site: Secondary | ICD-10-CM | POA: Diagnosis present

## 2012-06-03 DIAGNOSIS — F329 Major depressive disorder, single episode, unspecified: Secondary | ICD-10-CM | POA: Diagnosis present

## 2012-06-03 DIAGNOSIS — G2581 Restless legs syndrome: Secondary | ICD-10-CM | POA: Diagnosis present

## 2012-06-03 DIAGNOSIS — Z9981 Dependence on supplemental oxygen: Secondary | ICD-10-CM

## 2012-06-03 DIAGNOSIS — R5381 Other malaise: Secondary | ICD-10-CM | POA: Diagnosis present

## 2012-06-03 DIAGNOSIS — Z66 Do not resuscitate: Secondary | ICD-10-CM | POA: Diagnosis present

## 2012-06-03 DIAGNOSIS — I509 Heart failure, unspecified: Secondary | ICD-10-CM | POA: Diagnosis present

## 2012-06-03 DIAGNOSIS — I4891 Unspecified atrial fibrillation: Secondary | ICD-10-CM | POA: Diagnosis present

## 2012-06-03 DIAGNOSIS — E876 Hypokalemia: Secondary | ICD-10-CM | POA: Diagnosis not present

## 2012-06-03 DIAGNOSIS — I495 Sick sinus syndrome: Secondary | ICD-10-CM | POA: Diagnosis present

## 2012-06-03 DIAGNOSIS — K219 Gastro-esophageal reflux disease without esophagitis: Secondary | ICD-10-CM | POA: Diagnosis present

## 2012-06-03 DIAGNOSIS — E785 Hyperlipidemia, unspecified: Secondary | ICD-10-CM | POA: Diagnosis present

## 2012-06-03 DIAGNOSIS — I5022 Chronic systolic (congestive) heart failure: Secondary | ICD-10-CM

## 2012-06-03 DIAGNOSIS — N184 Chronic kidney disease, stage 4 (severe): Secondary | ICD-10-CM | POA: Diagnosis present

## 2012-06-03 DIAGNOSIS — I2589 Other forms of chronic ischemic heart disease: Secondary | ICD-10-CM | POA: Diagnosis present

## 2012-06-03 DIAGNOSIS — N183 Chronic kidney disease, stage 3 unspecified: Secondary | ICD-10-CM | POA: Diagnosis present

## 2012-06-03 DIAGNOSIS — M109 Gout, unspecified: Secondary | ICD-10-CM | POA: Diagnosis present

## 2012-06-03 DIAGNOSIS — I5023 Acute on chronic systolic (congestive) heart failure: Principal | ICD-10-CM | POA: Diagnosis present

## 2012-06-03 DIAGNOSIS — I129 Hypertensive chronic kidney disease with stage 1 through stage 4 chronic kidney disease, or unspecified chronic kidney disease: Secondary | ICD-10-CM | POA: Diagnosis present

## 2012-06-03 MED ORDER — VITAMIN C 250 MG PO TABS
250.0000 mg | ORAL_TABLET | Freq: Every day | ORAL | Status: DC
  Administered 2012-06-04 – 2012-06-09 (×6): 250 mg via ORAL
  Filled 2012-06-03 (×6): qty 1

## 2012-06-03 MED ORDER — ATORVASTATIN CALCIUM 10 MG PO TABS
10.0000 mg | ORAL_TABLET | Freq: Every day | ORAL | Status: DC
  Administered 2012-06-04 – 2012-06-08 (×5): 10 mg via ORAL
  Filled 2012-06-03 (×6): qty 1

## 2012-06-03 MED ORDER — SODIUM CHLORIDE 0.9 % IJ SOLN
3.0000 mL | Freq: Two times a day (BID) | INTRAMUSCULAR | Status: DC
  Administered 2012-06-04 – 2012-06-09 (×6): 3 mL via INTRAVENOUS

## 2012-06-03 MED ORDER — ASPIRIN EC 81 MG PO TBEC
81.0000 mg | DELAYED_RELEASE_TABLET | Freq: Every day | ORAL | Status: DC
  Administered 2012-06-04 – 2012-06-09 (×6): 81 mg via ORAL
  Filled 2012-06-03 (×6): qty 1

## 2012-06-03 MED ORDER — DOBUTAMINE IN D5W 4-5 MG/ML-% IV SOLN
2.5000 ug/kg/min | INTRAVENOUS | Status: DC
  Administered 2012-06-04: 2.5 ug/kg/min via INTRAVENOUS
  Administered 2012-06-04: 5 ug/kg/min via INTRAVENOUS
  Administered 2012-06-06 (×2): 2.5 ug/kg/min via INTRAVENOUS
  Filled 2012-06-03 (×4): qty 250

## 2012-06-03 MED ORDER — NITROGLYCERIN 0.4 MG SL SUBL: 0.4000 mg | SUBLINGUAL_TABLET | SUBLINGUAL | Status: DC | PRN

## 2012-06-03 MED ORDER — ACETAMINOPHEN 325 MG PO TABS: 650.0000 mg | ORAL_TABLET | ORAL | Status: DC | PRN

## 2012-06-03 MED ORDER — HYDROCODONE-ACETAMINOPHEN 7.5-325 MG PO TABS
1.0000 | ORAL_TABLET | ORAL | Status: DC | PRN
  Administered 2012-06-04 – 2012-06-05 (×7): 2 via ORAL
  Administered 2012-06-06: 1 via ORAL
  Administered 2012-06-06 (×2): 2 via ORAL
  Administered 2012-06-07 – 2012-06-09 (×9): 1 via ORAL
  Filled 2012-06-03: qty 1
  Filled 2012-06-03: qty 2
  Filled 2012-06-03: qty 1
  Filled 2012-06-03 (×2): qty 2
  Filled 2012-06-03: qty 1
  Filled 2012-06-03: qty 2
  Filled 2012-06-03: qty 1
  Filled 2012-06-03: qty 2
  Filled 2012-06-03 (×3): qty 1
  Filled 2012-06-03 (×2): qty 2
  Filled 2012-06-03: qty 1
  Filled 2012-06-03: qty 2
  Filled 2012-06-03: qty 1
  Filled 2012-06-03: qty 2
  Filled 2012-06-03: qty 1

## 2012-06-03 MED ORDER — LUBIPROSTONE 24 MCG PO CAPS
24.0000 ug | ORAL_CAPSULE | Freq: Two times a day (BID) | ORAL | Status: DC
  Administered 2012-06-04 – 2012-06-09 (×11): 24 ug via ORAL
  Filled 2012-06-03 (×14): qty 1

## 2012-06-03 MED ORDER — AMIODARONE HCL 100 MG PO TABS
100.0000 mg | ORAL_TABLET | Freq: Every day | ORAL | Status: DC
  Administered 2012-06-04 – 2012-06-09 (×6): 100 mg via ORAL
  Filled 2012-06-03 (×6): qty 1

## 2012-06-03 MED ORDER — SODIUM CHLORIDE 0.9 % IV SOLN
250.0000 mL | INTRAVENOUS | Status: DC | PRN
  Administered 2012-06-04 – 2012-06-08 (×2): 250 mL via INTRAVENOUS

## 2012-06-03 MED ORDER — POLYETHYLENE GLYCOL 3350 17 GM/SCOOP PO POWD
17.0000 g | Freq: Every day | ORAL | Status: DC
  Filled 2012-06-03: qty 255

## 2012-06-03 MED ORDER — FUROSEMIDE 10 MG/ML IJ SOLN
40.0000 mg | Freq: Two times a day (BID) | INTRAMUSCULAR | Status: DC
  Filled 2012-06-03: qty 4

## 2012-06-03 MED ORDER — PANTOPRAZOLE SODIUM 40 MG PO TBEC
40.0000 mg | DELAYED_RELEASE_TABLET | Freq: Every day | ORAL | Status: DC
  Administered 2012-06-04 – 2012-06-09 (×6): 40 mg via ORAL
  Filled 2012-06-03 (×6): qty 1

## 2012-06-03 MED ORDER — BISACODYL 5 MG PO TBEC: 5.0000 mg | DELAYED_RELEASE_TABLET | Freq: Every day | ORAL | Status: DC | PRN

## 2012-06-03 MED ORDER — DILTIAZEM HCL ER COATED BEADS 120 MG PO CP24
120.0000 mg | ORAL_CAPSULE | Freq: Every day | ORAL | Status: DC
  Administered 2012-06-04 – 2012-06-09 (×6): 120 mg via ORAL
  Filled 2012-06-03 (×6): qty 1

## 2012-06-03 MED ORDER — ONDANSETRON HCL 4 MG/2ML IJ SOLN: 4.0000 mg | Freq: Four times a day (QID) | INTRAMUSCULAR | Status: DC | PRN

## 2012-06-03 MED ORDER — LEVOTHYROXINE SODIUM 50 MCG PO TABS
50.0000 ug | ORAL_TABLET | Freq: Every day | ORAL | Status: DC
  Administered 2012-06-04 – 2012-06-09 (×6): 50 ug via ORAL
  Filled 2012-06-03 (×7): qty 1

## 2012-06-03 MED ORDER — MECLIZINE HCL 12.5 MG PO TABS
12.5000 mg | ORAL_TABLET | Freq: Three times a day (TID) | ORAL | Status: DC | PRN
  Filled 2012-06-03: qty 1

## 2012-06-03 MED ORDER — SODIUM CHLORIDE 0.9 % IJ SOLN: 3.0000 mL | INTRAMUSCULAR | Status: DC | PRN

## 2012-06-03 MED ORDER — CARVEDILOL 12.5 MG PO TABS
12.5000 mg | ORAL_TABLET | Freq: Two times a day (BID) | ORAL | Status: DC
  Administered 2012-06-04 – 2012-06-09 (×11): 12.5 mg via ORAL
  Filled 2012-06-03 (×13): qty 1

## 2012-06-03 NOTE — Telephone Encounter (Signed)
I spoke with Dr. Andrey Campanile this evening.  As per our discussion on Friday in the office, the patient did not improve over the weekend.  She is SOB.  Creat is apparently mildly better.  However given the fact that she is SOB and uncomfortable with generalized edema she will be direct admitted tonight for low dose dobutamine and low dose IV diuretic therapy.  I have spoken with bed control and Dr. Myrtis Ser.

## 2012-06-03 NOTE — H&P (Addendum)
Admit date: 06/03/2012 Primary Cardiologist  Dr. Antoine Poche Chief complaint/reason for admission:  SOB  HPI: This is an 78yo WF with a history of CAD, HTN, renal insuff, ischemic DCM EF 35-40% by echo 09/2011 who presented from her NH with worsening SOB.  She says that she has ben using O2 at night for some time but recently started using it during the day due to feeling like she is smothering.  She saw Dr. Antoine Poche last Thursday and was told that her kidneys were failing but her CHF was stable.  He did not make any changes in her medications and she went back to the NH.  Since then she started to decline with increased weakness and a smothering feeling not just at night but during the day as well.  This am the smothering sensation became severe and she is now admitted for further treatment.  She complains of severe weakness but denies any chest pain.  She has had increased LE edema as well.    PMH:    Past Medical History  Diagnosis Date  . Coronary artery disease     status post LAD stenting in 1999 after NSTEMI; CABG 2004; Last LHC 7/11: Proximal LAD 80%, mid 95%, distal 40%, mid R. High 40%, proximal RCA 40%, LIMA-LAD patent, SVG-PDA patent, EF 65%.   . Hypertension   . Hyperlipidemia   . Carotid artery disease     status post right carotid endarterectomy.  . Degenerative joint disease   . Restless leg syndrome   . Mild renal insufficiency   . Heparin induced thrombocytopenia   . Diphtheria     "as a child"  . Pleural effusion, bilateral 06/2003  . Pseudoaneurysm     status post repair following catherization in 1999  . Chronic systolic heart failure 10/02/11  . Ischemic cardiomyopathy     Echocardiogram 10/01/11: Mild LVH, EF 35-40%, basal inferior, basal to mid posterior and basal to mid anterolateral severe hypokinesis, mild to moderate AI, moderate MR (ischemic MR), moderate LAE, mild RVE, mildly reduced RV systolic function, mild RAE, PASP 43-44  . COPD (chronic obstructive pulmonary  disease)   . Atrial fibrillation/flutter     s/p multiple DCCVs;  amiodarone started 09/2011, TEE DCCV 3/13;  amio and coumadin d/c'd after admxn to [facility] 02/2012 with frequent falls/syncope  . Tachycardia-bradycardia syndrome     s/p Medtronic pacemaker implant 09/2011 (8 sec pause noted)  . GERD (gastroesophageal reflux disease)   . Renal artery stenosis     bilateral- status post stenting  . Non-functioning kidney   . Depression     "cries alot"  . Gout     PSH:    Past Surgical History  Procedure Date  . Carotid endarterectomy 05/2003    right  . Pacemaker insertion 10/02/11    implanted by Dr Johney Frame  . Cholecystectomy ?02/2011  . Tonsillectomy and adenoidectomy   . Renal artery stent 06/2003    bilaterally/e-chart  . Thoracentesis ? 2000; 05/2011  . Coronary angioplasty with stent placement 1999  . Av fistula repair 01/1998    w/pseudoaneurysm repair S/P catheterization/E-chart  . Coronary artery bypass graft 05/2003    CABG X 3  . Tee without cardioversion 10/23/2011    Procedure: TRANSESOPHAGEAL ECHOCARDIOGRAM (TEE);  Surgeon: Wendall Stade, MD;  Location: [facility] ENDOSCOPY;  Service: Cardiovascular;  Laterality: N/A;  . Cardioversion 10/23/2011    Procedure: CARDIOVERSION;  Surgeon: Wendall Stade, MD;  Location: [facility] ENDOSCOPY;  Service: Cardiovascular;  Laterality: N/A;  ALLERGIES:   Ramipril; Altace; Contrast media; Heparin; and Hydromorphone hcl  Prior to Admit Meds:   Prescriptions prior to admission  Medication Sig Dispense Refill  . ALPRAZolam (XANAX) 0.25 MG tablet Take 1 tablet (0.25 mg total) by mouth at bedtime as needed. For sleep  30 tablet  0  . amiodarone (PACERONE) 100 MG tablet Take 100 mg by mouth daily. 1/2 tab daily      . aspirin EC 81 MG tablet Take 1 tablet (81 mg total) by mouth daily.      . bisacodyl (DULCOLAX) 5 MG EC tablet Take 5 mg by mouth daily as needed. For constipation      . carvedilol (COREG) 12.5 MG tablet Take 1 tablet (12.5 mg total) by  mouth 2 (two) times daily with a meal.  120 tablet  3  . Cyanocobalamin (VITAMIN B-12 IJ) Inject 1,000 mcg as directed. Every two weeks.      Marland Kitchen diltiazem (CARDIZEM CD) 120 MG 24 hr capsule Take 120 mg by mouth daily.      . furosemide (LASIX) 40 MG tablet Take 40 mg by mouth as directed. TAKE 40 MG IN THE AM AND 20 MG IN THE AFTERNOON      . HYDROcodone-acetaminophen (NORCO) 7.5-325 MG per tablet Take 1-2 tablets by mouth every 4 (four) hours as needed. For pain  30 tablet  0  . IRON CR PO Take 324 mg by mouth 2 (two) times daily.      Marland Kitchen levothyroxine (SYNTHROID, LEVOTHROID) 50 MCG tablet Take 50 mcg by mouth daily.      . meclizine (ANTIVERT) 12.5 MG tablet Take 12.5 mg by mouth as needed.      . methylPREDNISolone acetate (DEPO-MEDROL) 80 MG/ML injection Inject 80 mg into the muscle once.      . nitroGLYCERIN (NITROSTAT) 0.4 MG SL tablet Place 0.4 mg under the tongue every 5 (five) minutes as needed. For chest pain      . omeprazole (PRILOSEC) 20 MG capsule Take 20 mg by mouth at bedtime.       . polyethylene glycol powder (GLYCOLAX/MIRALAX) powder Take 17 g by mouth daily.      . rosuvastatin (CRESTOR) 5 MG tablet Take 1 tablet (5 mg total) by mouth daily at 6 PM.  90 tablet  2  . vitamin C (ASCORBIC ACID) 250 MG tablet Take 250 mg by mouth daily.      . Vitamin D, Ergocalciferol, (DRISDOL) 50000 UNITS CAPS Take 50,000 Units by mouth every 30 (thirty) days.      Marland Kitchen lubiprostone (AMITIZA) 24 MCG capsule Take 24 mcg by mouth 2 (two) times daily with a meal.       Family HX:    Family History  Problem Relation Age of Onset  . Cancer Mother 71    died  . Lung disease Father 76    died  . Heart disease Sister     2 sisters and her son  . Anesthesia problems Neg Hx    Social HX:    History   Social History  . Marital Status: Widowed    Spouse Name: N/A    Number of Children: N/A  . Years of Education: N/A   Occupational History  . retired Health and safety inspector    Social History Main  Topics  . Smoking status: Never Smoker   . Smokeless tobacco: Never Used  . Alcohol Use: No  . Drug Use: No  . Sexually Active: No   Other  Topics Concern  . Not on file   Social History Narrative  . No narrative on file     ROS:  All 11 ROS were addressed and are negative except what is stated in the HPI  PHYSICAL EXAM Filed Vitals:   06/03/12 2035  BP: 132/57  Pulse: 85  Temp: 98.5 F (36.9 C)  Resp: 18   General: Well developed, well nourished, in no acute distress Head: Eyes PERRLA, No xanthomas.   Normal cephalic and atramatic  Lungs:   Crackles about 1/3 way up bilaterally. Heart:   HRRR S1 S2 Pulses are 2+ & equal.            No carotid bruit. No JVD.  No abdominal bruits. No femoral bruits. Abdomen: Bowel sounds are positive, abdomen soft and non-tender without masses Extremities:   Trace edema Neuro: Alert and oriented X 3. Psych:  Good affect, responds appropriately   Labs:   Lab Results  Component Value Date   WBC 6.0 05/16/2012   HGB 9.6* 05/16/2012   HCT 31.0* 05/16/2012   MCV 85.2 05/16/2012   PLT 260.0 05/16/2012   No results found for this basename: NA,K,CL,CO2,BUN,CREATININE,CALCIUM,LABALBU,PROT,BILITOT,ALKPHOS,ALT,AST,GLUCOSE in the last 168 hours Lab Results  Component Value Date   CKTOTAL 22 02/19/2012   CKMB 2.0 02/19/2012   TROPONINI <0.30 02/19/2012   No results found for this basename: PTT   Lab Results  Component Value Date   INR 2.57* 02/21/2012   INR 2.46* 02/20/2012   INR 1.93* 02/19/2012     Lab Results  Component Value Date   CHOL 82 10/01/2011   CHOL  Value: 123        ATP III CLASSIFICATION:  <200     mg/dL   Desirable  161-096  mg/dL   Borderline High  >=045    mg/dL   High        11/19/8117   CHOL  Value: 118        ATP III CLASSIFICATION:  <200     mg/dL   Desirable  147-829  mg/dL   Borderline High  >=562    mg/dL   High        09/12/8655   Lab Results  Component Value Date   HDL 26* 10/01/2011   HDL 41 03/03/2010   HDL 19*  09/23/2009   Lab Results  Component Value Date   LDLCALC 38 10/01/2011   LDLCALC  Value: 70        Total Cholesterol/HDL:CHD Risk Coronary Heart Disease Risk Table                     Men   Women  1/2 Average Risk   3.4   3.3  Average Risk       5.0   4.4  2 X Average Risk   9.6   7.1  3 X Average Risk  23.4   11.0        Use the calculated Patient Ratio above and the CHD Risk Table to determine the patient's CHD Risk.        ATP III CLASSIFICATION (LDL):  <100     mg/dL   Optimal  846-962  mg/dL   Near or Above                    Optimal  130-159  mg/dL   Borderline  952-841  mg/dL   High  >324     mg/dL  Very High 03/03/2010   LDLCALC  Value: 83        Total Cholesterol/HDL:CHD Risk Coronary Heart Disease Risk Table                     Men   Women  1/2 Average Risk   3.4   3.3  Average Risk       5.0   4.4  2 X Average Risk   9.6   7.1  3 X Average Risk  23.4   11.0        Use the calculated Patient Ratio above and the CHD Risk Table to determine the patient's CHD Risk.        ATP III CLASSIFICATION (LDL):  <100     mg/dL   Optimal  478-295  mg/dL   Near or Above                    Optimal  130-159  mg/dL   Borderline  621-308  mg/dL   High  >657     mg/dL   Very High 8/46/9629   Lab Results  Component Value Date   TRIG 88 10/01/2011   TRIG 61 03/03/2010   TRIG 79 09/23/2009   Lab Results  Component Value Date   CHOLHDL 3.2 10/01/2011   CHOLHDL 3.0 03/03/2010   CHOLHDL 6.2 09/23/2009   No results found for this basename: LDLDIRECT      Radiology:  Pending  EKG:  05/30/2012 AV paced - admit EKG pending  ASSESSMENT:  1.  Acute on chronic systolic CHF 2.  Ischemic dilated Cm 3.  CAD 4.  Chronic renal inusfficiency 5.  Paroxysmal Atrial fibrillation with PPM 6.  Severe weakness  PLAN:   1.  Admit to tele bed 2.  Cycle cardiac enzymes, check BNP 3.  SCDs for DVT prophylaxis 4.  IV Lasix and watch renal function closely 5.  Low dose IV Dobutamine for inotropic support to promote  diuresis 6.  Continue Amio/Cardizem to suppress aifb 7.  Continue ASA/Beta blocker 8.  No ACE I due to renal insuff   Quintella Reichert, MD  06/03/2012  10:46 PM

## 2012-06-04 ENCOUNTER — Encounter (HOSPITAL_COMMUNITY): Payer: Self-pay | Admitting: Urology

## 2012-06-04 DIAGNOSIS — I509 Heart failure, unspecified: Secondary | ICD-10-CM

## 2012-06-04 DIAGNOSIS — I5022 Chronic systolic (congestive) heart failure: Secondary | ICD-10-CM

## 2012-06-04 LAB — CBC WITH DIFFERENTIAL/PLATELET
Basophils Relative: 0 % (ref 0–1)
HCT: 29.4 % — ABNORMAL LOW (ref 36.0–46.0)
Hemoglobin: 9.2 g/dL — ABNORMAL LOW (ref 12.0–15.0)
Lymphocytes Relative: 10 % — ABNORMAL LOW (ref 12–46)
MCV: 85.7 fL (ref 78.0–100.0)
Monocytes Relative: 9 % (ref 3–12)
Neutro Abs: 5.4 10*3/uL (ref 1.7–7.7)
WBC: 6.8 10*3/uL (ref 4.0–10.5)

## 2012-06-04 LAB — COMPREHENSIVE METABOLIC PANEL
ALT: 7 U/L (ref 0–35)
Albumin: 3.2 g/dL — ABNORMAL LOW (ref 3.5–5.2)
BUN: 49 mg/dL — ABNORMAL HIGH (ref 6–23)
Calcium: 9 mg/dL (ref 8.4–10.5)
GFR calc Af Amer: 28 mL/min — ABNORMAL LOW (ref >90)
Glucose, Bld: 109 mg/dL — ABNORMAL HIGH (ref 70–99)
Sodium: 130 mEq/L — ABNORMAL LOW (ref 135–145)
Total Protein: 7.2 g/dL (ref 6.0–8.3)

## 2012-06-04 LAB — URINALYSIS, ROUTINE W REFLEX MICROSCOPIC
Glucose, UA: NEGATIVE mg/dL
Ketones, ur: NEGATIVE mg/dL
Protein, ur: NEGATIVE mg/dL

## 2012-06-04 LAB — TROPONIN I
Troponin I: <0.3 ng/mL (ref <0.30)
Troponin I: <0.3 ng/mL (ref <0.30)
Troponin I: <0.3 ng/mL (ref <0.30)

## 2012-06-04 LAB — BASIC METABOLIC PANEL
CO2: 27 mEq/L (ref 19–32)
Calcium: 8.7 mg/dL (ref 8.4–10.5)
Chloride: 97 mEq/L (ref 96–112)
Potassium: 4.3 mEq/L (ref 3.5–5.1)
Sodium: 135 mEq/L (ref 135–145)

## 2012-06-04 LAB — TSH: TSH: 6.851 u[IU]/mL — ABNORMAL HIGH (ref 0.350–4.500)

## 2012-06-04 LAB — URINE MICROSCOPIC-ADD ON

## 2012-06-04 LAB — MRSA PCR SCREENING: MRSA by PCR: NEGATIVE

## 2012-06-04 MED ORDER — ENSURE COMPLETE PO LIQD
237.0000 mL | ORAL | Status: DC
  Administered 2012-06-04 – 2012-06-08 (×5): 237 mL via ORAL

## 2012-06-04 MED ORDER — FUROSEMIDE 10 MG/ML IJ SOLN
40.0000 mg | Freq: Two times a day (BID) | INTRAMUSCULAR | Status: DC
  Administered 2012-06-04 – 2012-06-05 (×5): 40 mg via INTRAVENOUS
  Filled 2012-06-04 (×6): qty 4

## 2012-06-04 MED ORDER — CIPROFLOXACIN HCL 250 MG PO TABS
250.0000 mg | ORAL_TABLET | Freq: Two times a day (BID) | ORAL | Status: DC
  Administered 2012-06-04 – 2012-06-09 (×11): 250 mg via ORAL
  Filled 2012-06-04 (×13): qty 1

## 2012-06-04 NOTE — Progress Notes (Signed)
Utilization Review Completed.  

## 2012-06-04 NOTE — Progress Notes (Signed)
SUBJECTIVE:  No SOB.  However she has "belly" pain and thinks that she has a UTI   PHYSICAL EXAM Filed Vitals:   06/03/12 2035 06/03/12 2059 06/04/12 0444 06/04/12 0603  BP: 132/57  131/52 121/65  Pulse: 85  70 71  Temp: 98.5 F (36.9 C)  98.5 F (36.9 C)   TempSrc: Oral  Oral   Resp: 18  20   Height: 5\' 4"  (1.626 m) 5\' 4"  (1.626 m)    Weight: 114 lb 14.4 oz (52.118 kg) 114 lb 14.4 oz (52.118 kg) 116 lb (52.617 kg)   SpO2: 95%  96%    General:  No distress HEENT:  PERRL Lungs:  Bilateral left greater than right diffuse mild crackles Heart:  Irregular Abdomen:  Nontender Extremities:  Trace edema Neuro:  Intact and nonfocal  LABS: Lab Results  Component Value Date   CKTOTAL 22 02/19/2012   CKMB 2.0 02/19/2012   TROPONINI <0.30 06/03/2012   Results for orders placed during the hospital encounter of 06/03/12 (from the past 24 hour(s))  PRO B NATRIURETIC PEPTIDE     Status: Abnormal   Collection Time   06/03/12 11:25 PM      Component Value Range   Pro B Natriuretic peptide (BNP) 30403.0 (*) 0 - 450 pg/mL  TROPONIN I     Status: Normal   Collection Time   06/03/12 11:25 PM      Component Value Range   Troponin I <0.30  <0.30 ng/mL  COMPREHENSIVE METABOLIC PANEL     Status: Abnormal   Collection Time   06/03/12 11:26 PM      Component Value Range   Sodium 130 (*) 135 - 145 mEq/L   Potassium 4.4  3.5 - 5.1 mEq/L   Chloride 94 (*) 96 - 112 mEq/L   CO2 24  19 - 32 mEq/L   Glucose, Bld 109 (*) 70 - 99 mg/dL   BUN 49 (*) 6 - 23 mg/dL   Creatinine, Ser 1.61 (*) 0.50 - 1.10 mg/dL   Calcium 9.0  8.4 - 09.6 mg/dL   Total Protein 7.2  6.0 - 8.3 g/dL   Albumin 3.2 (*) 3.5 - 5.2 g/dL   AST 13  0 - 37 U/L   ALT 7  0 - 35 U/L   Alkaline Phosphatase 71  39 - 117 U/L   Total Bilirubin 0.5  0.3 - 1.2 mg/dL   GFR calc non Af Amer 25 (*) >90 mL/min   GFR calc Af Amer 28 (*) >90 mL/min  CBC WITH DIFFERENTIAL     Status: Abnormal   Collection Time   06/03/12 11:26 PM   Component Value Range   WBC 6.8  4.0 - 10.5 K/uL   RBC 3.43 (*) 3.87 - 5.11 MIL/uL   Hemoglobin 9.2 (*) 12.0 - 15.0 g/dL   HCT 04.5 (*) 40.9 - 81.1 %   MCV 85.7  78.0 - 100.0 fL   MCH 26.8  26.0 - 34.0 pg   MCHC 31.3  30.0 - 36.0 g/dL   RDW 91.4 (*) 78.2 - 95.6 %   Platelets 203  150 - 400 K/uL   Neutrophils Relative 79 (*) 43 - 77 %   Lymphocytes Relative 10 (*) 12 - 46 %   Monocytes Relative 9  3 - 12 %   Eosinophils Relative 2  0 - 5 %   Basophils Relative 0  0 - 1 %   Neutro Abs 5.4  1.7 - 7.7 K/uL  Lymphs Abs 0.7  0.7 - 4.0 K/uL   Monocytes Absolute 0.6  0.1 - 1.0 K/uL   Eosinophils Absolute 0.1  0.0 - 0.7 K/uL   Basophils Absolute 0.0  0.0 - 0.1 K/uL   RBC Morphology POLYCHROMASIA PRESENT    TSH     Status: Abnormal   Collection Time   06/03/12 11:26 PM      Component Value Range   TSH 6.851 (*) 0.350 - 4.500 uIU/mL  MRSA PCR SCREENING     Status: Normal   Collection Time   06/04/12 12:46 AM      Component Value Range   MRSA by PCR NEGATIVE  NEGATIVE    Intake/Output Summary (Last 24 hours) at 06/04/12 0732 Last data filed at 06/04/12 0432  Gross per 24 hour  Intake    404 ml  Output   1150 ml  Net   -746 ml    ASSESSMENT AND PLAN:  Acute on chronic systolic heart failure:  I will continue and increase the dobutamine and continue low dose diuretic.  I reviewed her CXR (no report in chart).  Mild cephalization.    RENAL INSUFFICIENCY:  Follow creat closely.    CAD :  No acute ischemia.  No further work up at this point.   Atrial fibrillation:  She has not been on warfarin.  Maintaining NSR. High risk for warfarin.  UTI:  She likely has an ongoing UTI and I will start Cipro and check a UA.   Hypothyroidism - TSH pending  Rollene Rotunda 06/04/2012 7:32 AM

## 2012-06-04 NOTE — Progress Notes (Signed)
Pt arrived to floor from [facility] via EMS in NAD. VSS BP 132/57, HR 85, temp 98.5, R 18, O2 Sat 95% on 2Lnc. Pt oriented to floor and room. MD paged for orders. Will continue to monitor. Jobe Igo A RN

## 2012-06-04 NOTE — Progress Notes (Signed)
I cosign for [name] [name]'s assessment, med administration, notes, I/O, and care plan/education.  

## 2012-06-04 NOTE — Clinical Social Work Psychosocial (Signed)
Clinical Social Work Department BRIEF PSYCHOSOCIAL ASSESSMENT 06/04/2012  Patient:  [name] [name], [name] [name]     Account Number:  [id number]     Admit date:  06/03/2012  Clinical Social Worker:  Juliette Mangle  Date/Time:  06/04/2012 02:15 PM  Referred by:  RN  Date Referred:  06/04/2012 Referred for  ALF Placement   Other Referral:   Interview type:  Patient Other interview type:   Patient's daughter was at bedside    PSYCHOSOCIAL DATA Living Status:  FACILITY Admitted from facility:  [facility] Level of care:  Assisted Living Primary support name:  [name] [name] Primary support relationship to patient:  CHILD, ADULT Degree of support available:   Stong and vested    CURRENT CONCERNS Current Concerns  Post-Acute Placement   Other Concerns:    SOCIAL WORK ASSESSMENT / PLAN Clinical Social Worker received referral that patient is from ALF [facility]. CSW reviewed chart and met with patient and patient's daughter at bedside.  CSW introduced self, explained role, and provided emotional support. CSW dsicussed returning to [facility]. Patient is agreeable to returning. CSW encouraged patient to ask questions as needed of CSW and medical staff. CSW will begin the placement process and will continue to follow and assist with all d/c planning.   Assessment/plan status:  Psychosocial Support/Ongoing Assessment of Needs Other assessment/ plan:   Information/referral to community resources:   none noted    PATIENT'S/FAMILY'S RESPONSE TO PLAN OF CARE: Patient and patient's daughter were very appreciative of support and information provided by CSW. CSW has already started paperwork for patient's return. CSW will continue to follow and will assist with all d/c needs.        Sabino Niemann, MSW, [employer] [phone]

## 2012-06-04 NOTE — Clinical Documentation Improvement (Signed)
CKD DOCUMENTATION CLARIFICATION QUERY   THIS DOCUMENT IS NOT A PERMANENT PART OF THE MEDICAL RECORD   Please update your documentation within the medical record to reflect your response to this query.                                                                                         06/04/12   Dr. Antoine Poche and/or Associates,  In a better effort to capture your patient's severity of illness, reflect appropriate length of stay and utilization of resources, a review of the patient medical record has revealed the following indicators:  "Chronic renal insufficiency" Quintella Reichert, MD  06/03/2012  10:46 PM  BUN/Cr/GFR  this admission      (white female) 31/1.86/29 46/1.72/26  09/2011 BUN Range    23-28 Cr. Range       1.29-1.66 GFR Range    31-37  05/2011 BUN Range    21-35 Cr. Range       1.63-1.79 GFR Range    25-28     Based on your clinical judgment, please document in the progress notes and discharge summary if a condition below provides greater specificity regarding the patient's renal function:     - CKD Stage I -  GFR > OR = 90  - CKD Stage II - GFR 60-89  - CKD Stage III - GFR 30-59  - CKD Stage IV - GFR 15-29  - CKD Stage V - GFR < 15  - ESRD (End Stage Renal Disease)  - Other Condition  - Unable to Clinically determine    In responding to this query please exercise your independent judgment.    The fact that a query is asked, does not imply that any particular answer is desired or expected.   Reviewed: additional documentation in the medical record  Thank You,  Jerral Ralph  RN BSN CCDS Certified Clinical Documentation Specialist: Cell   [phone]  [facility] [place]  TO RESPOND TO THE THIS QUERY, FOLLOW THE INSTRUCTIONS BELOW:  1. If needed, update documentation for the patient's encounter via the notes activity.  2. Access this query again and click edit on the In [employer].  3. After updating, or not,  click F2 to complete all highlighted (required) fields concerning your review. Select "additional documentation in the medical record" OR "no additional documentation provided".  4. Click Sign note button.  5. The deficiency will fall out of your In Basket *Please let us know if you are not able to complete this workflow by phone or e-mail (listed below).

## 2012-06-04 NOTE — Progress Notes (Signed)
INITIAL ADULT NUTRITION ASSESSMENT Date: 06/04/2012   Time: 12:17 PM Reason for Assessment: MST (Malnutrition Screening Tool)   INTERVENTION: 1. Ensure Complete po daily, each supplement provides 350 kcal and 13 grams of protein. Per pt request 2. RD will continue to follow    DOCUMENTATION CODES Per approved criteria  -Not Applicable    ASSESSMENT: Female 78 y.o.  Dx: increased SOB   Hx:  Past Medical History  Diagnosis Date  . Coronary artery disease     status post LAD stenting in 1999 after NSTEMI; CABG 2004; Last LHC 7/11: Proximal LAD 80%, mid 95%, distal 40%, mid R. High 40%, proximal RCA 40%, LIMA-LAD patent, SVG-PDA patent, EF 65%.   . Hypertension   . Hyperlipidemia   . Carotid artery disease     status post right carotid endarterectomy.  . Degenerative joint disease   . Restless leg syndrome   . Mild renal insufficiency   . Heparin induced thrombocytopenia   . Diphtheria     "as a child"  . Pleural effusion, bilateral 06/2003  . Pseudoaneurysm     status post repair following catherization in 1999  . Chronic systolic heart failure 10/02/11  . Ischemic cardiomyopathy     Echocardiogram 10/01/11: Mild LVH, EF 35-40%, basal inferior, basal to mid posterior and basal to mid anterolateral severe hypokinesis, mild to moderate AI, moderate MR (ischemic MR), moderate LAE, mild RVE, mildly reduced RV systolic function, mild RAE, PASP 43-44  . COPD (chronic obstructive pulmonary disease)   . Atrial fibrillation/flutter     s/p multiple DCCVs;  amiodarone started 09/2011, TEE DCCV 3/13;  amio and coumadin d/c'd after admxn to [facility] 02/2012 with frequent falls/syncope  . Tachycardia-bradycardia syndrome     s/p Medtronic pacemaker implant 09/2011 (8 sec pause noted)  . GERD (gastroesophageal reflux disease)   . Renal artery stenosis     bilateral- status post stenting  . Non-functioning kidney   . Depression     "cries alot"  . Gout     Past Surgical History  Procedure  Date  . Carotid endarterectomy 05/2003    right  . Pacemaker insertion 10/02/11    implanted by Dr Johney Frame  . Cholecystectomy ?02/2011  . Tonsillectomy and adenoidectomy   . Renal artery stent 06/2003    bilaterally/e-chart  . Thoracentesis ? 2000; 05/2011  . Coronary angioplasty with stent placement 1999  . Av fistula repair 01/1998    w/pseudoaneurysm repair S/P catheterization/E-chart  . Coronary artery bypass graft 05/2003    CABG X 3  . Tee without cardioversion 10/23/2011    Procedure: TRANSESOPHAGEAL ECHOCARDIOGRAM (TEE);  Surgeon: Wendall Stade, MD;  Location: [facility] ENDOSCOPY;  Service: Cardiovascular;  Laterality: N/A;  . Cardioversion 10/23/2011    Procedure: CARDIOVERSION;  Surgeon: Wendall Stade, MD;  Location: [facility] ENDOSCOPY;  Service: Cardiovascular;  Laterality: N/A;     Related Meds:     . amiodarone  100 mg Oral Daily  . aspirin EC  81 mg Oral Daily  . atorvastatin  10 mg Oral q1800  . carvedilol  12.5 mg Oral BID WC  . ciprofloxacin  250 mg Oral BID  . diltiazem  120 mg Oral Daily  . furosemide  40 mg Intravenous BID  . levothyroxine  50 mcg Oral Q breakfast  . lubiprostone  24 mcg Oral BID WC  . pantoprazole  40 mg Oral Daily  . polyethylene glycol powder  17 g Oral Daily  . sodium chloride  3 mL  Intravenous Q12H  . vitamin C  250 mg Oral Daily  . DISCONTD: furosemide  40 mg Intravenous BID     Ht: 5\' 4"  (162.6 cm)  Wt: 116 lb (52.617 kg) (scale C)  Ideal Wt: 54.5 kg  % Ideal Wt: 97%  Usual Wt: "144 lbs" per pt report Wt Readings from Last 10 Encounters:  06/04/12 116 lb (52.617 kg)  05/30/12 119 lb (53.978 kg)  05/16/12 116 lb 6.4 oz (52.799 kg)  03/07/12 77 lb 12 oz (35.267 kg)  02/21/12 124 lb 1.9 oz (56.3 kg)  01/17/12 114 lb 1.9 oz (51.764 kg)  12/05/11 111 lb (50.349 kg)  10/22/11 116 lb 12.8 oz (52.98 kg)  10/08/11 117 lb 11.6 oz (53.4 kg)  10/08/11 117 lb 11.6 oz (53.4 kg)    % Usual Wt: ~100%  Body mass index is 19.91 kg/(m^2). Pt is  WNL per current BMI   Food/Nutrition Related Hx: Pt reports poor appetite and weight loss pta per MST (Malnutrition Screening Tool)   Labs:  CMP     Component Value Date/Time   NA 135 06/04/2012 0635   K 4.3 06/04/2012 0635   CL 97 06/04/2012 0635   CO2 27 06/04/2012 0635   GLUCOSE 86 06/04/2012 0635   BUN 46* 06/04/2012 0635   CREATININE 1.72* 06/04/2012 0635   CALCIUM 8.7 06/04/2012 0635   PROT 7.2 06/03/2012 2326   ALBUMIN 3.2* 06/03/2012 2326   AST 13 06/03/2012 2326   ALT 7 06/03/2012 2326   ALKPHOS 71 06/03/2012 2326   BILITOT 0.5 06/03/2012 2326   GFRNONAA 26* 06/04/2012 0635   GFRAA 30* 06/04/2012 0635      Intake/Output Summary (Last 24 hours) at 06/04/12 1222 Last data filed at 06/04/12 0900  Gross per 24 hour  Intake    644 ml  Output   1150 ml  Net   -506 ml     Diet Order: Cardiac  Supplements/Tube Feeding: none   IVF:    DOBUTamine Last Rate: 2.5 mcg/kg/min (06/04/12 0145)    Estimated Nutritional Needs:   Kcal: 1250-1400 Protein: 50-60 gm  Fluid:  1.3-1.5 L   Pt admitted with increased SOB. Pt is from a nursing facility and was having increased weakness as well as requiring more O2.  Pt states that she has been at the nursing facility for  "101 days" and that since she has been there she has lost 28 lbs. No record of pt weighing her reported UBW. Pt also states that she doesn't eat much of her meals because she doesn't like the food provided. Her daughter brings her Ensure daily.  Per weight hx, pt weight has been stable for several months. One very low weight, likely in error.  Pt was eating lunch at time of RD visit, had a good appetite. Ate 100% of breakfast.    NUTRITION DIAGNOSIS: No nutrition dx at this time  MONITORING/EVALUATION(Goals): Goal: continue to meet >90% estimated nutrition needs  Monitor: PO intake, weight, labs  EDUCATION NEEDS: -No education needs identified at this time    Clarene Duke RD, LDN Pager  [phone] After Hours pager [phone]  06/04/2012, 12:17 PM

## 2012-06-05 DIAGNOSIS — I5023 Acute on chronic systolic (congestive) heart failure: Principal | ICD-10-CM

## 2012-06-05 LAB — BASIC METABOLIC PANEL
BUN: 45 mg/dL — ABNORMAL HIGH (ref 6–23)
CO2: 29 mEq/L (ref 19–32)
CO2: 27 mEq/L (ref 19–32)
Chloride: 95 mEq/L — ABNORMAL LOW (ref 96–112)
Chloride: 94 mEq/L — ABNORMAL LOW (ref 96–112)
Creatinine, Ser: 1.62 mg/dL — ABNORMAL HIGH (ref 0.50–1.10)
Glucose, Bld: 138 mg/dL — ABNORMAL HIGH (ref 70–99)
Potassium: 4.1 mEq/L (ref 3.5–5.1)

## 2012-06-05 MED ORDER — POLYETHYLENE GLYCOL 3350 17 G PO PACK
17.0000 g | PACK | Freq: Every day | ORAL | Status: DC
  Filled 2012-06-05: qty 1

## 2012-06-05 NOTE — Clinical Social Work Placement (Addendum)
Clinical Social Work Department CLINICAL SOCIAL WORK PLACEMENT NOTE 06/05/2012  Patient:  [name] [name], [name] [name]  Account Number:  [id number] Admit date:  06/03/2012  Clinical Social Worker:  Breyona Swander Lubertha Basque  Date/time:  06/05/2012 04:21 PM  Clinical Social Work is seeking post-discharge placement for this patient at the following level of care:   SKILLED NURSING   (*CSW will update this form in Epic as items are completed)   06/05/2012  Patient/family provided with [facility] Department of Clinical Social Work's list of facilities offering this level of care within the geographic area requested by the patient (or if unable, by the patient's family).  06/05/2012  Patient/family informed of their freedom to choose among providers that offer the needed level of care, that participate in Medicare, Medicaid or managed care program needed by the patient, have an available bed and are willing to accept the patient.  06/05/2012  Patient/family informed of [facility]' ownership interest in [facility], as well as of the fact that they are under no obligation to receive care at this facility.  PASARR submitted to EDS on  PASARR number received from EDS on   FL2 transmitted to all facilities in geographic area requested by pt/family on  06/05/2012 FL2 transmitted to all facilities within larger geographic area on   Patient informed that his/her managed care company has contracts with or will negotiate with  certain facilities, including the following:     Patient/family informed of bed offers received:  06/06/12 Patient chooses bed at [facility] Physician recommends and patient chooses bed at    Patient to be transferred to  on  06/09/12  Patient to be transferred to facility by [facility]  The following physician request were entered in Epic:   Additional Comments:

## 2012-06-05 NOTE — Progress Notes (Signed)
Pt is recommending SNF placement. Patient is agreeable. CSW will begin SNF search process. CSW will also touch base with paitent's daughter.  Sabino Niemann, MSW, [employer] [phone]

## 2012-06-05 NOTE — Progress Notes (Signed)
    SUBJECTIVE: She feels much better.  Mild suprapubic pain.  No SOB   PHYSICAL EXAM Filed Vitals:   06/04/12 0603 06/04/12 1300 06/04/12 2143 06/05/12 0548  BP: 121/65 106/62 120/51 131/55  Pulse: 71 70 69 70  Temp:  97.8 F (36.6 C) 98.1 F (36.7 C) 98 F (36.7 C)  TempSrc:  Oral Oral Oral  Resp:  18 18 20   Height:      Weight:    118 lb 6.2 oz (53.7 kg)  SpO2:  95% 97% 98%   General:  No distress Lungs:  Bilateral left greater than right diffuse mild crackles Heart:  Irregular Abdomen:  Nontender Extremities:  Trace edema   LABS: Lab Results  Component Value Date   CKTOTAL 22 02/19/2012   CKMB 2.0 02/19/2012   TROPONINI <0.30 06/04/2012   Results for orders placed during the hospital encounter of 06/03/12 (from the past 24 hour(s))  URINALYSIS, ROUTINE W REFLEX MICROSCOPIC     Status: Abnormal   Collection Time   06/04/12  9:43 AM      Component Value Range   Color, Urine YELLOW  YELLOW   APPearance TURBID (*) CLEAR   Specific Gravity, Urine 1.012  1.005 - 1.030   pH 6.0  5.0 - 8.0   Glucose, UA NEGATIVE  NEGATIVE mg/dL   Hgb urine dipstick MODERATE (*) NEGATIVE   Bilirubin Urine NEGATIVE  NEGATIVE   Ketones, ur NEGATIVE  NEGATIVE mg/dL   Protein, ur NEGATIVE  NEGATIVE mg/dL   Urobilinogen, UA 1.0  0.0 - 1.0 mg/dL   Nitrite NEGATIVE  NEGATIVE   Leukocytes, UA LARGE (*) NEGATIVE  URINE MICROSCOPIC-ADD ON     Status: Abnormal   Collection Time   06/04/12  9:43 AM      Component Value Range   Squamous Epithelial / LPF FEW (*) RARE   WBC, UA TOO NUMEROUS TO COUNT  <3 WBC/hpf   RBC / HPF 11-20  <3 RBC/hpf   Bacteria, UA MANY (*) RARE  TROPONIN I     Status: Normal   Collection Time   06/04/12 10:22 AM      Component Value Range   Troponin I <0.30  <0.30 ng/mL    Intake/Output Summary (Last 24 hours) at 06/05/12 0643 Last data filed at 06/05/12 1478  Gross per 24 hour  Intake    624 ml  Output   3000 ml  Net  -2376 ml    ASSESSMENT AND  PLAN:  Acute on chronic systolic heart failure:   Down three liters since admit but weight reported to be up. Continue current dobutamine another day.  RENAL INSUFFICIENCY:  Creat improved.  Continue current therapy.  CAD :  No acute ischemia.  No further work up at this point.   Atrial fibrillation:  She has not been on warfarin.  Maintaining NSR. High risk for warfarin.  UTI:  Postive UTI.  On Cipro  Hypothyroidism - TSH elevated but lower than previous.  [name] [facility] 06/05/2012 6:43 AM

## 2012-06-05 NOTE — Progress Notes (Signed)
Pharmacist Heart Failure Core Measure Documentation  Assessment: [name] [name] has an EF documented as 35-40% by ECHO 09/2011.  Rationale: Heart failure patients with left ventricular systolic dysfunction (LVSD) and an EF < 40% should be prescribed an angiotensin converting enzyme inhibitor (ACEI) or angiotensin receptor blocker (ARB) at discharge unless a contraindication is documented in the medical record.  This patient is not currently on an ACEI or ARB for HF.  This note is being placed in the record in order to provide documentation that a contraindication to the use of these agents is present for this encounter.  ACE Inhibitor or Angiotensin Receptor Blocker is contraindicated (specify all that apply)  []   ACEI allergy AND ARB allergy []   Angioedema []   Moderate or severe aortic stenosis []   Hyperkalemia []   Hypotension []   Renal artery stenosis [x]   Worsening renal function, preexisting renal disease or dysfunction   Christoper Fabian, PharmD, BCPS Clinical pharmacist, pager [phone] 06/05/2012 11:12 AM

## 2012-06-05 NOTE — Plan of Care (Signed)
Problem: Phase I Progression Outcomes Goal: EF % per last Echo/documented,Core Reminder form on chart Outcome: Completed/Met Date Met:  06/05/12 February 2013/EF: 35-40%

## 2012-06-05 NOTE — Progress Notes (Signed)
I Co-signed for Westley Foots RN/ BSN for assessments, IV assessments, progress notes, I and O's, Care plans, and patient education. Ernesta Amble, RN/BSN

## 2012-06-05 NOTE — Evaluation (Signed)
Physical Therapy Evaluation Patient Details Name: [name] [name] MRN: [id number] DOB: 01-24-1925 Today's Date: 06/05/2012 Time: 4098-1191 PT Time Calculation (min): 35 min  PT Assessment / Plan / Recommendation Clinical Impression  Pt is an 78 y/o female admitted s/p SOB/CHF along with the below PT problem list. Pt would benefit from acute PT to maximize independence and facilitate d/c back to SNF.    PT Assessment  Patient needs continued PT services    Follow Up Recommendations  Post acute inpatient    Does the patient have the potential to tolerate intense rehabilitation   No, Recommend SNF  Barriers to Discharge None      Equipment Recommendations  None recommended by PT    Recommendations for Other Services     Frequency Min 3X/week    Precautions / Restrictions Precautions Precautions: Fall Restrictions Weight Bearing Restrictions: No   Pertinent Vitals/Pain None      Mobility  Bed Mobility Bed Mobility: Supine to Sit Supine to Sit: 4: Min assist;HOB flat Details for Bed Mobility Assistance: Assist for trunk to translate anterior. Cues for sequence. Transfers Transfers: Sit to Stand;Stand to Sit Sit to Stand: 4: Min guard;With upper extremity assist;From bed Stand to Sit: 4: Min guard;With upper extremity assist;To chair/3-in-1 Details for Transfer Assistance: Guarding for balance with cues for safe sequence. Ambulation/Gait Ambulation/Gait Assistance: 4: Min assist Ambulation Distance (Feet): 270 Feet Assistive device: 1 person hand held assist Ambulation/Gait Assistance Details: Assist for balance with unilateral HHA and contralateral pushing of IV pole. Cues for sequence. Gait Pattern: Step-through pattern;Decreased stride length;Narrow base of support Stairs: No Wheelchair Mobility Wheelchair Mobility: No    Shoulder Instructions     Exercises     PT Diagnosis: Difficulty walking;Generalized weakness  PT Problem List: Decreased  strength;Decreased activity tolerance;Decreased balance;Decreased mobility PT Treatment Interventions: DME instruction;Gait training;Functional mobility training;Therapeutic activities;Balance training;Patient/family education   PT Goals Acute Rehab PT Goals PT Goal Formulation: With patient Time For Goal Achievement: 06/19/12 Potential to Achieve Goals: Good Pt will go Supine/Side to Sit: with supervision PT Goal: Supine/Side to Sit - Progress: Goal set today Pt will go Sit to Supine/Side: with supervision PT Goal: Sit to Supine/Side - Progress: Goal set today Pt will go Sit to Stand: with supervision PT Goal: Sit to Stand - Progress: Goal set today Pt will go Stand to Sit: with supervision PT Goal: Stand to Sit - Progress: Goal set today Pt will Ambulate: >150 feet;with supervision;with least restrictive assistive device PT Goal: Ambulate - Progress: Goal set today  Visit Information  Last PT Received On: 06/05/12 Assistance Needed: +1    Subjective Data  Subjective: "I am in rehab currently." Patient Stated Goal: Return home.   Prior Functioning  Home Living Lives With: Alone;Other (Comment) (At SNF.) Available Help at Discharge: Available 24 hours/day Type of Home: Skilled Nursing Facility Home Access: Level entry Home Layout: One level Home Adaptive Equipment: None Prior Function Level of Independence: Needs assistance Able to Take Stairs?: Yes Vocation: Retired Musician: No difficulties    Cognition  Overall Cognitive Status: Appears within functional limits for tasks assessed/performed Arousal/Alertness: Awake/alert Orientation Level: Appears intact for tasks assessed Behavior During Session: [facility] for tasks performed    Extremity/Trunk Assessment Right Upper Extremity Assessment RUE ROM/Strength/Tone: Within functional levels RUE Sensation: WFL - Light Touch RUE Coordination: WFL - gross/fine motor Left Upper Extremity Assessment LUE  ROM/Strength/Tone: WFL for tasks assessed (Recently broken and has rotator cuff issues.) LUE Sensation: WFL - Light Touch  LUE Coordination: WFL - gross motor Right Lower Extremity Assessment RLE ROM/Strength/Tone: Within functional levels RLE Sensation: WFL - Light Touch RLE Coordination: WFL - gross motor Left Lower Extremity Assessment LLE ROM/Strength/Tone: Within functional levels LLE Sensation: WFL - Light Touch LLE Coordination: WFL - gross motor Trunk Assessment Trunk Assessment: Normal   Balance Balance Balance Assessed: No  End of Session PT - End of Session Equipment Utilized During Treatment: Gait belt Activity Tolerance: Patient tolerated treatment well Patient left: in chair;with call bell/phone within reach Nurse Communication: Mobility status  GP     Cephus Shelling 06/05/2012, 1:13 PM  06/05/2012 Cephus Shelling, PT, DPT [phone]

## 2012-06-06 ENCOUNTER — Telehealth: Payer: Self-pay | Admitting: Cardiology

## 2012-06-06 LAB — BASIC METABOLIC PANEL
BUN: 48 mg/dL — ABNORMAL HIGH (ref 6–23)
CO2: 29 mEq/L (ref 19–32)
Chloride: 93 mEq/L — ABNORMAL LOW (ref 96–112)
Creatinine, Ser: 1.59 mg/dL — ABNORMAL HIGH (ref 0.50–1.10)

## 2012-06-06 MED ORDER — FUROSEMIDE 40 MG PO TABS
40.0000 mg | ORAL_TABLET | Freq: Two times a day (BID) | ORAL | Status: DC
  Administered 2012-06-06 – 2012-06-09 (×7): 40 mg via ORAL
  Filled 2012-06-06 (×9): qty 1

## 2012-06-06 NOTE — Telephone Encounter (Signed)
Spoke with daughter who is aware of Dr Hochrein's comments.  She is concerned as to whether this will be a short term fix of a long term fix.  Explained to her that a lot of it depended on the pts intake of NA and that she needs to be on a strict low NA diet.  They should weigh her daily and report any increase in weight to Dr Andrey Campanile so that it can be treated.  Daughter is concerned that pt is more weak and fatigued in the facility as compared to home.  Informed daughter that if the pt isn't encouraged to get up and be more active she will continue to become more weak.  Daughter states understanding.

## 2012-06-06 NOTE — Progress Notes (Signed)
    SUBJECTIVE: She feels better and she ambulated with PT yesterday. No SOB   PHYSICAL EXAM Filed Vitals:   06/05/12 1728 06/05/12 2008 06/06/12 0358 06/06/12 0419  BP: 118/65 131/68  148/75  Pulse: 70 71  69  Temp:  98.1 F (36.7 C)  97.5 F (36.4 C)  TempSrc:  Oral  Oral  Resp:  18  18  Height:      Weight:   115 lb 15.4 oz (52.6 kg)   SpO2:  96%  100%   General:  No distress Lungs:  Few crackles Heart:  Irregular Abdomen:  Nontender Extremities:  No edema   LABS: Lab Results  Component Value Date   CKTOTAL 22 02/19/2012   CKMB 2.0 02/19/2012   TROPONINI <0.30 06/04/2012   Results for orders placed during the hospital encounter of 06/03/12 (from the past 24 hour(s))  BASIC METABOLIC PANEL     Status: Abnormal   Collection Time   06/05/12  8:40 AM      Component Value Range   Sodium 132 (*) 135 - 145 mEq/L   Potassium 4.1  3.5 - 5.1 mEq/L   Chloride 94 (*) 96 - 112 mEq/L   CO2 27  19 - 32 mEq/L   Glucose, Bld 138 (*) 70 - 99 mg/dL   BUN 45 (*) 6 - 23 mg/dL   Creatinine, Ser 1.61 (*) 0.50 - 1.10 mg/dL   Calcium 8.9  8.4 - 09.6 mg/dL   GFR calc non Af Amer 28 (*) >90 mL/min   GFR calc Af Amer 33 (*) >90 mL/min    Intake/Output Summary (Last 24 hours) at 06/06/12 0454 Last data filed at 06/06/12 0300  Gross per 24 hour  Intake 2107.92 ml  Output   3230 ml  Net -1122.08 ml    ASSESSMENT AND PLAN:  Acute on chronic systolic heart failure:   Decrease dobutamine today and change to PO Lasix.  Stop Dobutamine Sat.  Discharge this weekend if possible  CKD Stage III/IV:  Creat stable and much better than it was earlier this month as an outpatient.  Continue current therapy.  CAD :  No acute ischemia.  No further work up at this point.   Atrial fibrillation:  She has not been on warfarin.  Maintaining NSR. High risk for warfarin.  UTI:  Postive UTI.  On Cipro  Hypothyroidism - TSH elevated but lower than previous.  Disposition -  She says she would return to  [facility].  Patient is a DNR.  Rollene Rotunda 06/06/2012 6:37 AM

## 2012-06-06 NOTE — Telephone Encounter (Signed)
New problem:  Patient admit to [facility] . What the next plan of care for her mother

## 2012-06-06 NOTE — Progress Notes (Signed)
Patient will not be able to d/c over the weekend. [facility] does not accept weekend admission. CSW will continue to follow and will assist with d/c on Monday if patient is medically stable.   Sabino Niemann, MSW, [employer] [phone]

## 2012-06-06 NOTE — Progress Notes (Signed)
Physical Therapy Treatment Patient Details Name: RAMIAH HELFRICH MRN: [id number] DOB: [birth year] Today's Date: 06/06/2012 Time: 4098-1191 PT Time Calculation (min): 26 min  PT Assessment / Plan / Recommendation Comments on Treatment Session  Trialed ambulation without supplemental 02.  Pt tolerated well.  02 sats 94% immediatly upon returning back to room.  Pt very pleasant & motivated.      Follow Up Recommendations  Post acute inpatient     Does the patient have the potential to tolerate intense rehabilitation  No, Recommend SNF  Barriers to Discharge        Equipment Recommendations  None recommended by PT    Recommendations for Other Services    Frequency Min 3X/week   Plan Discharge plan remains appropriate    Precautions / Restrictions Precautions Precautions: Fall Restrictions Weight Bearing Restrictions: No    Pertinent Vitals/Pain No pain reported.  02 sats remained >90% RA.  RN notified.  Pt remained off supplemental 02 at end of session.      Mobility  Bed Mobility Bed Mobility: Supine to Sit;Sitting - Scoot to Edge of Bed Supine to Sit: 4: Min assist;HOB elevated;With rails Sitting - Scoot to Edge of Bed: 4: Min assist Details for Bed Mobility Assistance: (A) to lift shoulders/trunk to sitting upright.  pt with limited use of LUE (pt reports she broke her LUE ~5 wks ago) Transfers Transfers: Sit to Stand;Stand to Sit Sit to Stand: 4: Min assist;4: Min guard;With upper extremity assist;From bed;From toilet Stand to Sit: 4: Min guard;With upper extremity assist;To chair/3-in-1;To toilet Details for Transfer Assistance: Min (A) to achieve standing from low surface (toilet).   Ambulation/Gait Ambulation/Gait Assistance: 4: Min guard Ambulation Distance (Feet): 80 Feet Assistive device: Other (Comment) (pushing IV pole with RUE) Ambulation/Gait Assistance Details: Distance limited due to release of small solid BM.  Pt ambulated by pushing IV pole with RUE.  Pt  with slow, shuffling steps.   Gait Pattern: Step-through pattern;Decreased stride length;Shuffle Gait velocity: slow Stairs: No Wheelchair Mobility Wheelchair Mobility: No      PT Goals Acute Rehab PT Goals Time For Goal Achievement: 06/19/12 Potential to Achieve Goals: Good Pt will go Supine/Side to Sit: with supervision PT Goal: Supine/Side to Sit - Progress: Progressing toward goal Pt will go Sit to Supine/Side: with supervision Pt will go Sit to Stand: with supervision PT Goal: Sit to Stand - Progress: Progressing toward goal Pt will go Stand to Sit: with supervision PT Goal: Stand to Sit - Progress: Progressing toward goal Pt will Ambulate: >150 feet;with supervision;with least restrictive assistive device PT Goal: Ambulate - Progress: Progressing toward goal  Visit Information  Last PT Received On: 06/06/12 Assistance Needed: +1    Subjective Data  Patient Stated Goal: Return home.   Cognition  Overall Cognitive Status: Appears within functional limits for tasks assessed/performed Arousal/Alertness: Awake/alert Orientation Level: Appears intact for tasks assessed Behavior During Session: [facility] for tasks performed    Balance     End of Session PT - End of Session Activity Tolerance: Patient tolerated treatment well Patient left: in chair;with call bell/phone within reach Nurse Communication: Mobility status     Verdell Face, [place] [phone] 06/06/2012

## 2012-06-06 NOTE — Telephone Encounter (Signed)
Left message to call back  - per Dr Antoine Poche pt's medications are being weaned, they have been able to remove a lot of fluid and her kidney function is improving.  He expects she should be able to be discharged back to facility on Monday.

## 2012-06-06 NOTE — Telephone Encounter (Signed)
None

## 2012-06-07 LAB — BASIC METABOLIC PANEL
CO2: 29 mEq/L (ref 19–32)
Calcium: 9.1 mg/dL (ref 8.4–10.5)
Chloride: 96 mEq/L (ref 96–112)
Creatinine, Ser: 1.46 mg/dL — ABNORMAL HIGH (ref 0.50–1.10)
Glucose, Bld: 91 mg/dL (ref 70–99)

## 2012-06-07 NOTE — Progress Notes (Signed)
Patient ID: [name] [name], female   DOB: [birth year], 78 y.o.   MRN: [id number] Subjective:  Dyspnea nearly back to baseline.   Objective:  Vital Signs in the last 24 hours: Temp:  [97.4 F (36.3 C)-99.5 F (37.5 C)] 99.1 F (37.3 C) (10/26 0636) Pulse Rate:  [69-70] 69  (10/26 0636) Resp:  [18-20] 19  (10/26 0636) BP: (113-144)/(53-69) 132/67 mmHg (10/26 0636) SpO2:  [98 %] 98 % (10/26 0636) Weight:  [117 lb 15.1 oz (53.5 kg)] 117 lb 15.1 oz (53.5 kg) (10/26 0636)  Intake/Output from previous day: 10/25 0701 - 10/26 0700 In: 1407.8 [P.O.:1080; I.V.:327.8] Out: 2350 [Urine:2350] Intake/Output from this shift: Total I/O In: 240 [P.O.:240] Out: -   Physical Exam: Well appearing elderly woman, NAD HEENT: Unremarkable Neck:  No JVD, no thyromegally Lungs:  Clear with no wheezes. HEART:  Regular rate rhythm, no murmurs, no rubs, no clicks Abd:  Flat, positive bowel sounds, no organomegally, no rebound, no guarding Ext:  2 plus pulses, no edema, no cyanosis, no clubbing Skin:  No rashes no nodules Neuro:  CN II through XII intact, motor grossly intact  Lab Results: No results found for this basename: WBC:2,HGB:2,PLT:2 in the last 72 hours  Basename 06/07/12 0510 06/06/12 0600  NA 135 133*  K 4.0 4.2  CL 96 93*  CO2 29 29  GLUCOSE 91 96  BUN 40* 48*  CREATININE 1.46* 1.59*    Basename 06/04/12 1022  TROPONINI <0.30   Hepatic Function Panel No results found for this basename: PROT,ALBUMIN,AST,ALT,ALKPHOS,BILITOT,BILIDIR,IBILI in the last 72 hours No results found for this basename: CHOL in the last 72 hours No results found for this basename: PROTIME in the last 72 hours  Imaging: No results found.  Cardiac Studies: Tele - AV paced. Assessment/Plan:  1. Acute/chronic systolic/diastolic CHF - continue dobutamine today and discontinue tomorrow. Continue diuretic. 2. Atrial fib - she is maintaining NSR. Continue low dose amiodarone.  LOS: 4 days    Gregg  Taylor,M.D. 06/07/2012, 9:20 AM

## 2012-06-08 LAB — BASIC METABOLIC PANEL
BUN: 36 mg/dL — ABNORMAL HIGH (ref 6–23)
CO2: 28 mEq/L (ref 19–32)
Calcium: 9.2 mg/dL (ref 8.4–10.5)
GFR calc non Af Amer: 33 mL/min — ABNORMAL LOW (ref >90)
Glucose, Bld: 104 mg/dL — ABNORMAL HIGH (ref 70–99)
Potassium: 3.8 mEq/L (ref 3.5–5.1)

## 2012-06-08 NOTE — Progress Notes (Signed)
Patient ID: [name] [name], female   DOB: 1925/07/04, 78 y.o.   MRN: [id number] Subjective:  Dyspnea back to baseline.   Objective:  Vital Signs in the last 24 hours: Temp:  [97.3 F (36.3 C)-99 F (37.2 C)] 97.3 F (36.3 C) (10/27 0516) Pulse Rate:  [66-73] 70  (10/27 0516) Resp:  [16-20] 20  (10/26 2137) BP: (122-147)/(47-70) 147/47 mmHg (10/27 0516) SpO2:  [92 %-100 %] 100 % (10/27 0516) Weight:  [113 lb 12.1 oz (51.6 kg)] 113 lb 12.1 oz (51.6 kg) (10/27 0516)  Intake/Output from previous day: 10/26 0701 - 10/27 0700 In: 986 [P.O.:960; I.V.:26] Out: -  Intake/Output from this shift: Total I/O In: -  Out: 300 [Urine:300]  Physical Exam: Well appearing elderly woman, NAD HEENT: Unremarkable Neck:  No JVD, no thyromegally Lungs:  Clear with no wheezes. HEART:  Regular rate rhythm, no murmurs, no rubs, no clicks Abd:  Flat, positive bowel sounds, no organomegally, no rebound, no guarding Ext:  2 plus pulses, no edema, no cyanosis, no clubbing Skin:  No rashes no nodules Neuro:  CN II through XII intact, motor grossly intact  Lab Results: No results found for this basename: WBC:2,HGB:2,PLT:2 in the last 72 hours  Basename 06/08/12 0525 06/07/12 0510  NA 133* 135  K 3.8 4.0  CL 93* 96  CO2 28 29  GLUCOSE 104* 91  BUN 36* 40*  CREATININE 1.41* 1.46*   No results found for this basename: TROPONINI:2,CK,MB:2 in the last 72 hours Hepatic Function Panel No results found for this basename: PROT,ALBUMIN,AST,ALT,ALKPHOS,BILITOT,BILIDIR,IBILI in the last 72 hours No results found for this basename: CHOL in the last 72 hours No results found for this basename: PROTIME in the last 72 hours  Imaging: No results found.  Cardiac Studies: Tele - AV paced. Assessment/Plan:  1. Acute/chronic systolic/diastolic CHF - dc dobutamine. Continue diuretic. Anticipate discharge to assisted living tomorrow. 2. Atrial fib - she is maintaining NSR. Continue low dose amiodarone.  LOS: 5  days    Gregg Taylor,M.D. 06/08/2012, 9:49 AM

## 2012-06-09 ENCOUNTER — Telehealth: Payer: Self-pay | Admitting: Cardiology

## 2012-06-09 DIAGNOSIS — I5023 Acute on chronic systolic (congestive) heart failure: Secondary | ICD-10-CM

## 2012-06-09 LAB — BASIC METABOLIC PANEL
BUN: 37 mg/dL — ABNORMAL HIGH (ref 6–23)
Chloride: 91 mEq/L — ABNORMAL LOW (ref 96–112)
Creatinine, Ser: 1.44 mg/dL — ABNORMAL HIGH (ref 0.50–1.10)
GFR calc Af Amer: 37 mL/min — ABNORMAL LOW (ref >90)
Glucose, Bld: 65 mg/dL — ABNORMAL LOW (ref 70–99)

## 2012-06-09 MED ORDER — POTASSIUM CHLORIDE CRYS ER 20 MEQ PO TBCR: 20.0000 meq | EXTENDED_RELEASE_TABLET | Freq: Two times a day (BID) | ORAL | Status: DC

## 2012-06-09 MED ORDER — CIPROFLOXACIN HCL 250 MG PO TABS: 250.0000 mg | ORAL_TABLET | Freq: Two times a day (BID) | ORAL | Status: DC

## 2012-06-09 MED ORDER — POTASSIUM CHLORIDE CRYS ER 20 MEQ PO TBCR
40.0000 meq | EXTENDED_RELEASE_TABLET | Freq: Once | ORAL | Status: AC
  Administered 2012-06-09: 40 meq via ORAL

## 2012-06-09 MED ORDER — FUROSEMIDE 40 MG PO TABS: 40.0000 mg | ORAL_TABLET | Freq: Two times a day (BID) | ORAL | Status: DC

## 2012-06-09 MED ORDER — POTASSIUM CHLORIDE CRYS ER 20 MEQ PO TBCR
EXTENDED_RELEASE_TABLET | ORAL | Status: AC
  Filled 2012-06-09: qty 2

## 2012-06-09 NOTE — Progress Notes (Signed)
Notified Md of low potassium of 3.3. Md stated will address this issue. No new orders given. Will continue to monitor.

## 2012-06-09 NOTE — Telephone Encounter (Signed)
Per Di Kindle. Call this is a transitional care pt

## 2012-06-09 NOTE — Discharge Summary (Signed)
Patient ID: [name] [name],  MRN: [id number], DOB/AGE: 11-04-1924 78 y.o.  Admit date: 06/03/2012 Discharge date: 06/09/2012  Primary Care Provider: Pamelia Hoit Primary Cardiologist: J. Hochrein, MD  Discharge Diagnoses Principal Problem:  *Acute on chronic systolic heart failure  **s/p diuresis this admission with a net negative of 3,497 and reduction in weight from 114 Lbs on admission->118 Lbs on 10/24->109.4 Lbs on d/c. Active Problems:  CKD (chronic kidney disease), stage IV  CAD  Atrial fibrillation  HYPERLIPIDEMIA  HYPERTENSION  DEGENERATIVE JOINT DISEASE  Cardiac pacemaker in situ  Hypokalemia  UTI  **To complete 7 day course of Cipro 10/29.  Allergies Allergies  Allergen Reactions  . Ramipril Hives  . Altace (Ramipril) Hives  . Contrast Media (Iodinated Diagnostic Agents) Other (See Comments)    Reaction noted by md  . Heparin Other (See Comments)    Clots blood instead of thinning  . Hydromorphone Hcl Other (See Comments)    Crazy feeling   Procedures  None  History of Present Illness  78 y/o female with prior h/o CAD and ICM/CHF who was in her USOH until approximately 1 week prior to admission, when she began to experience progressive weakness, weight gain, dyspnea, and lower extremity edema.  She was seen in the office on 10/18 and after discussion with her PCP, decision was made to defer admission.  Unfortunately, her symptoms progressed and she presented to the ED on 10/22 where she had evidence of volume overload.  She was admitted for further evaluation and diuresis with inotropic support.  Hospital Course  Following admission, pt was placed on IV dobutamine and lasix with good diuresis and stable renal function.  She's had a net negative of 3.4 Liters for the course of this admission.  Her weight has come down to 109.4 lbs (from a peak of 118 on 10/24).  Her renal function has remained stable.  She has had hypokalemia, which has required  supplementation.  Dobutamine was d/c'd on 10/27, and lasix was converted from IV to PO.  In the setting of systolic dysfunction, her usual dose of diltiazem has been discontinued.  She has been maintained on amiodarone for mgmt of her afib.  She is not felt to be a good coumadin candidate secondary to h/o falls.  She will be discharged back to SNF today in good condition.  Of note, she has been treated with Cipro for UTI during this admission.  She will complete a 7 day course tomorrow.  Discharge Vitals Blood pressure 121/50, pulse 70, temperature 98 F (36.7 C), temperature source Oral, resp. rate 18, height 5\' 4"  (1.626 m), weight 121 lb 4.1 oz (55 kg), SpO2 93.00%.  Filed Weights   06/07/12 0636 06/08/12 0516 06/09/12 0453  Weight: 117 lb 15.1 oz (53.5 kg) 113 lb 12.1 oz (51.6 kg) 109 lb 4.0 oz   Labs  CBC Lab Results  Component Value Date   WBC 6.8 06/03/2012   HGB 9.2* 06/03/2012   HCT 29.4* 06/03/2012   MCV 85.7 06/03/2012   PLT 203 06/03/2012   Basic Metabolic Panel  Basename 06/09/12 06/08/12 0525  NA 129* 133*  K 3.3* 3.8  CL 91* 93*  CO2 29 28  GLUCOSE 65* 104*  BUN 37* 36*  CREATININE 1.44* 1.41*  CALCIUM 9.2 9.2  MG -- --  PHOS -- --   Liver Function Tests Lab Results  Component Value Date   ALT 7 06/03/2012   AST 13 06/03/2012   ALKPHOS 71 06/03/2012  BILITOT 0.5 06/03/2012   Cardiac Enzymes  TROPONINI <0.30 06/04/2012   Thyroid Function Tests Lab Results  Component Value Date   TSH 6.851* 06/03/2012    Disposition  Pt is being discharged home today in good condition.  Follow-up Plans & Appointments      Follow-up Information    Follow up with Tereso Newcomer, PA. On 06/16/2012. (2:40 PM - Dr. Jenene Slicker PA)    Contact information:   [address] [place] [address] [phone]        Discharge Medications    Medication List     As of 06/09/2012 11:03 AM    STOP taking these medications         diltiazem 120  MG 24 hr capsule   Commonly known as: CARDIZEM CD      methylPREDNISolone acetate 80 MG/ML injection   Commonly known as: DEPO-MEDROL      TAKE these medications         ALPRAZolam 0.25 MG tablet   Commonly known as: XANAX   Take 1 tablet (0.25 mg total) by mouth at bedtime as needed. For sleep      amiodarone 100 MG tablet   Commonly known as: PACERONE   Take 100 mg by mouth daily.      AMITIZA 24 MCG capsule   Generic drug: lubiprostone   Take 24 mcg by mouth 2 (two) times daily with a meal.      aspirin EC 81 MG tablet   Take 1 tablet (81 mg total) by mouth daily.      bisacodyl 5 MG EC tablet   Commonly known as: DULCOLAX   Take 5 mg by mouth daily as needed. For constipation      carvedilol 12.5 MG tablet   Commonly known as: COREG   Take 1 tablet (12.5 mg total) by mouth 2 (two) times daily with a meal.      ciprofloxacin 250 MG tablet   Commonly known as: CIPRO   Take 1 tablet (250 mg total) by mouth 2 (two) times daily.      furosemide 40 MG tablet   Commonly known as: LASIX   Take 1 tablet (40 mg total) by mouth 2 (two) times daily.      HYDROcodone-acetaminophen 10-325 MG per tablet   Commonly known as: NORCO   Take 1 tablet by mouth 4 (four) times daily as needed. For breakthrough pain      IRON CR PO   Take 324 mg by mouth 2 (two) times daily.      levothyroxine 50 MCG tablet   Commonly known as: SYNTHROID, LEVOTHROID   Take 50 mcg by mouth daily.      meclizine 12.5 MG tablet   Commonly known as: ANTIVERT   Take 12.5 mg by mouth 3 (three) times daily as needed. For nausea and vomiting      nitroGLYCERIN 0.4 MG SL tablet   Commonly known as: NITROSTAT   Place 0.4 mg under the tongue every 5 (five) minutes as needed. For chest pain      omeprazole 20 MG capsule   Commonly known as: PRILOSEC   Take 20 mg by mouth at bedtime.      polyethylene glycol powder powder   Commonly known as: GLYCOLAX/MIRALAX   Take 17 g by mouth daily.       potassium chloride SA 20 MEQ tablet   Commonly known as: K-DUR,KLOR-CON   Take 1 tablet (20 mEq total)  by mouth 2 (two) times daily.      rosuvastatin 5 MG tablet   Commonly known as: CRESTOR   Take 5 mg by mouth daily.      VITAMIN B-12 IJ   Inject 1,000 mcg as directed. Every two weeks.      vitamin C 250 MG tablet   Commonly known as: ASCORBIC ACID   Take 250 mg by mouth 2 (two) times daily.      Vitamin D (Ergocalciferol) 50000 UNITS Caps   Commonly known as: DRISDOL   Take 50,000 Units by mouth every 30 (thirty) days.       Outstanding Labs/Studies  BMET @ f/u in 7 days.  Duration of Discharge Encounter   Greater than 30 minutes including physician time.  Signed, Nicolasa Ducking NP 06/09/2012, 11:03 AM

## 2012-06-09 NOTE — Telephone Encounter (Signed)
NA at [phone].

## 2012-06-09 NOTE — Progress Notes (Signed)
    Subjective:  Feels "pretty good." No chest pain or dyspnea.  Objective:  Vital Signs in the last 24 hours: Temp:  [97.4 F (36.3 C)-98.1 F (36.7 C)] 98 F (36.7 C) (10/28 0453) Pulse Rate:  [69-74] 70  (10/28 0453) Resp:  [18] 18  (10/28 0453) BP: (104-133)/(46-67) 124/67 mmHg (10/28 0453) SpO2:  [93 %-99 %] 93 % (10/28 0453) Weight:  [55 kg (121 lb 4.1 oz)] 55 kg (121 lb 4.1 oz) (10/28 0453)  Intake/Output from previous day: 10/27 0701 - 10/28 0700 In: 783 [P.O.:780; I.V.:3] Out: 300 [Urine:300]  Physical Exam: Pt is alert and oriented, pleasant elderly woman in NAD HEENT: normal Neck: JVP - normal Lungs: CTA bilaterally CV: RRR without murmur or gallop Abd: soft, NT, Positive BS, no hepatomegaly Ext: no C/C/E, distal pulses intact and equal Skin: warm/dry no rash   Lab Results: No results found for this basename: WBC:2,HGB:2,PLT:2 in the last 72 hours  Basename 06/09/12 06/08/12 0525  NA 129* 133*  K 3.3* 3.8  CL 91* 93*  CO2 29 28  GLUCOSE 65* 104*  BUN 37* 36*  CREATININE 1.44* 1.41*   No results found for this basename: TROPONINI:2,CK,MB:2 in the last 72 hours  Tele: V-paced, no arrhythmia, personally reviewed  Assessment/Plan:  1. Acute on chronic systolic CHF. Now improved. Continue coreg and furosemide at current doses (furosemide increased from home dose. Probably best to stop dilt in setting of cardiomyopathy. 2. Atrial fib - continue low-dose amio. Off anticoag secondary to frequent falls. 3. CKD - stable, stage 3 4. Dispo - back to SNF today. Follow-up Dr Antoine Poche in [place] within 2 weeks.   Tonny Bollman, M.D. 06/09/2012, 7:59 AM

## 2012-06-09 NOTE — Telephone Encounter (Signed)
Spoke to patient's daughter,she stated mother is still in hospital and will be discharged this pm back to [facility] [phone].Will call back this afternoon.

## 2012-06-09 NOTE — Progress Notes (Signed)
Clinical social worker assisted with patient discharge to skilled nursing facility, [facility].  CSW addressed all family questions and concerns. CSW copied chart and added all important documents. CSW also set up patient transportation with [facility]. Clinical Social Worker will sign off for now as social work intervention is no longer needed.   Jlen Wintle, MSW, LCSWA [phone] 

## 2012-06-09 NOTE — Progress Notes (Addendum)
Patient can d/c to [facility] as soon as D/C summary is dictated.  Sabino Niemann, MSW, [employer] [phone]

## 2012-06-09 NOTE — Progress Notes (Signed)
[name] to be D/C'd Skilled nursing facility per MD order.  Discussed with the patient and all questions fully answered.   [name], [name]  Home Medication Instructions [id number]   Printed on:06/09/12 1948  Medication Information                    nitroGLYCERIN (NITROSTAT) 0.4 MG SL tablet Place 0.4 mg under the tongue every 5 (five) minutes as needed. For chest pain           omeprazole (PRILOSEC) 20 MG capsule Take 20 mg by mouth at bedtime.            bisacodyl (DULCOLAX) 5 MG EC tablet Take 5 mg by mouth daily as needed. For constipation           Cyanocobalamin (VITAMIN B-12 IJ) Inject 1,000 mcg as directed. Every two weeks.           carvedilol (COREG) 12.5 MG tablet Take 1 tablet (12.5 mg total) by mouth 2 (two) times daily with a meal.           ALPRAZolam (XANAX) 0.25 MG tablet Take 1 tablet (0.25 mg total) by mouth at bedtime as needed. For sleep           aspirin EC 81 MG tablet Take 1 tablet (81 mg total) by mouth daily.           Vitamin D, Ergocalciferol, (DRISDOL) 50000 UNITS CAPS Take 50,000 Units by mouth every 30 (thirty) days.           lubiprostone (AMITIZA) 24 MCG capsule Take 24 mcg by mouth 2 (two) times daily with a meal.           polyethylene glycol powder (GLYCOLAX/MIRALAX) powder Take 17 g by mouth daily.           levothyroxine (SYNTHROID, LEVOTHROID) 50 MCG tablet Take 50 mcg by mouth daily.           IRON CR PO Take 324 mg by mouth 2 (two) times daily.           amiodarone (PACERONE) 100 MG tablet Take 100 mg by mouth daily.           rosuvastatin (CRESTOR) 5 MG tablet Take 5 mg by mouth daily.           HYDROcodone-acetaminophen (NORCO) 10-325 MG per tablet Take 1 tablet by mouth 4 (four) times daily as needed. For breakthrough pain           vitamin C (ASCORBIC ACID) 250 MG tablet Take 250 mg by mouth 2 (two) times daily.           meclizine (ANTIVERT) 12.5 MG tablet Take 12.5 mg by mouth 3 (three) times daily as  needed. For nausea and vomiting           ciprofloxacin (CIPRO) 250 MG tablet Take 1 tablet (250 mg total) by mouth 2 (two) times daily.           furosemide (LASIX) 40 MG tablet Take 1 tablet (40 mg total) by mouth 2 (two) times daily.           potassium chloride SA (K-DUR,KLOR-CON) 20 MEQ tablet Take 1 tablet (20 mEq total) by mouth 2 (two) times daily.             VVS, Skin clean, dry and intact without evidence of skin break down, no evidence of skin tears noted. IV catheter discontinued intact. Site without  signs and symptoms of complications. Dressing and pressure applied.   Patient escorted via stretcher, and D/C SNF via ambulance.  [name] [name] 06/09/2012 7:48 PM

## 2012-06-10 NOTE — Progress Notes (Signed)
Utilization Review Completed.  

## 2012-06-10 NOTE — Telephone Encounter (Signed)
Spoke to patient's nurse Lupita Leash at [facility] she stated patient rested well last night and is continuing to have pain off and on in left shoulder that she fractured this past summer.Lupita Leash understands discharge instructions,and medications. Patient has appointment with Tereso Newcomer PA 06/16/12 at 2:40 pm.

## 2012-06-16 ENCOUNTER — Encounter: Payer: Self-pay | Admitting: Cardiology

## 2012-06-16 ENCOUNTER — Encounter: Payer: Self-pay | Admitting: Physician Assistant

## 2012-06-16 ENCOUNTER — Ambulatory Visit (INDEPENDENT_AMBULATORY_CARE_PROVIDER_SITE_OTHER): Payer: Medicare (Managed Care) | Admitting: Physician Assistant

## 2012-06-16 VITALS — BP 127/54 | HR 75 | Ht 64.0 in | Wt 113.8 lb

## 2012-06-16 DIAGNOSIS — I4891 Unspecified atrial fibrillation: Secondary | ICD-10-CM

## 2012-06-16 DIAGNOSIS — I10 Essential (primary) hypertension: Secondary | ICD-10-CM

## 2012-06-16 DIAGNOSIS — N184 Chronic kidney disease, stage 4 (severe): Secondary | ICD-10-CM

## 2012-06-16 DIAGNOSIS — M1990 Unspecified osteoarthritis, unspecified site: Secondary | ICD-10-CM

## 2012-06-16 DIAGNOSIS — I5022 Chronic systolic (congestive) heart failure: Secondary | ICD-10-CM

## 2012-06-16 DIAGNOSIS — I2510 Atherosclerotic heart disease of native coronary artery without angina pectoris: Secondary | ICD-10-CM

## 2012-06-16 NOTE — Patient Instructions (Addendum)
I WILL HAVE [facility] FAX OVER YOUR LAB RESULTS FROM TODAY TO [facility], [place] [phone]  PLEASE SCHEDULE AN APPT TO SEE DR. HOCHREIN IN THE NEXT 3-4 WEEKS IN THE [place] OFFICE; IF THIS IS NOT AVAILABLE THENOK TO KEEP THE APPT HERE 06/26/12

## 2012-06-16 NOTE — Progress Notes (Signed)
[address] [place], [place]  [address] Phone: [phone], Fax:  [phone]  Date:  06/16/2012   Name:  [name] [name]   DOB:  19-Jun-1925   MRN:  [id number]  PCP:  Pamelia Hoit, MD  Primary Cardiologist:  Dr. Rollene Rotunda  Primary Electrophysiologist:  None    History of Present Illness: [name] [name] is a 78 y.o. female who returns for follow up after recent admission to the hospital for a/c systolic CHF.  She has a hx of CAD, status post stenting to the LAD in 1999, s/p CABG in 2004, HTN, HL, atrial fibrillation/flutter, status post multiple DCCVs in the past, ICM. Last LHC 7/11: pLAD 80%, mid 95%, distal 40%, mid RI 40%, pRCA 40%, LIMA-LAD patent, SVG-PDA patent, EF 65%.  Admitted 09/2011 with atrial fibrillation with RVR complicated by acute systolic CHF. She demonstrated signs of sick sinus syndrome with an 8 second pause and underwent pacemaker implantation and was placed on amiodarone. Echocardiogram 10/01/11: Mild LVH, EF 35-40%, basal inferior, basal to mid posterior and basal to mid anterolateral severe hypokinesis, mild to moderate AI, moderate MR (ischemic MR), moderate LAE, mild RVE, mildly reduced RV systolic function, mild RAE, PASP 43-44.   She was admitted 10/22-10/28 with a/c systolic CHF in the setting of chronic kidney disease, stage IV. Patient had seen Dr. Antoine Poche in the office with progressive dyspnea and weight gain. Attempted outpatient management was pursued. However, the patient continued to have progressively worsening symptoms and presented to the emergency room. She was treated with IV dobutamine and IV diuresis. She had a negative diuresis of 3.4 L. Discharge weight was 109 pounds.  She is staying at [facility].  She is feeling well. Her dyspnea is much improved. She denies orthopnea, PND or edema. She denies chest pain. She denies cough. She denies syncope.  Labs (10/13):  Na 129, K 3.3, creatinine 1.44, Hgb 9.2, TSH  6.851  Wt Readings from Last 3 Encounters:  06/16/12 113 lb 12.8 oz (51.619 kg)  06/09/12 109 lb 6.4 oz (49.624 kg)  05/30/12 119 lb (53.978 kg)     Past Medical History  Diagnosis Date  . Coronary artery disease     status post LAD stenting in 1999 after NSTEMI; CABG 2004; Last LHC 7/11: Proximal LAD 80%, mid 95%, distal 40%, mid R. High 40%, proximal RCA 40%, LIMA-LAD patent, SVG-PDA patent, EF 65%.   . Hypertension   . Hyperlipidemia   . Carotid artery disease     status post right carotid endarterectomy.  . Degenerative joint disease   . Restless leg syndrome   . Mild renal insufficiency   . Heparin induced thrombocytopenia   . Diphtheria     "as a child"  . Pleural effusion, bilateral 06/2003  . Pseudoaneurysm     status post repair following catherization in 1999  . Chronic systolic heart failure 10/02/11  . Ischemic cardiomyopathy     Echocardiogram 10/01/11: Mild LVH, EF 35-40%, basal inferior, basal to mid posterior and basal to mid anterolateral severe hypokinesis, mild to moderate AI, moderate MR (ischemic MR), moderate LAE, mild RVE, mildly reduced RV systolic function, mild RAE, PASP 43-44  . COPD (chronic obstructive pulmonary disease)   . Atrial fibrillation/flutter     s/p multiple DCCVs;  amiodarone started 09/2011, TEE DCCV 3/13;  amio and coumadin d/c'd after admxn to [facility] 02/2012 with frequent falls/syncope  . Tachycardia-bradycardia syndrome     s/p Medtronic pacemaker implant 09/2011 (8  sec pause noted)  . GERD (gastroesophageal reflux disease)   . Renal artery stenosis     bilateral- status post stenting  . Non-functioning kidney   . Depression     "cries alot"  . Gout     Current Outpatient Prescriptions  Medication Sig Dispense Refill  . ALPRAZolam (XANAX) 0.25 MG tablet Take 1 tablet (0.25 mg total) by mouth at bedtime as needed. For sleep  30 tablet  0  . amiodarone (PACERONE) 100 MG tablet Take 100 mg by mouth daily.      Marland Kitchen aspirin EC 81 MG tablet  Take 1 tablet (81 mg total) by mouth daily.      . bisacodyl (DULCOLAX) 5 MG EC tablet Take 5 mg by mouth daily as needed. For constipation      . carvedilol (COREG) 12.5 MG tablet Take 1 tablet (12.5 mg total) by mouth 2 (two) times daily with a meal.  120 tablet  3  . Cyanocobalamin (VITAMIN B-12 IJ) Inject 1,000 mcg as directed. Every two weeks.      . furosemide (LASIX) 40 MG tablet Take 1 tablet (40 mg total) by mouth 2 (two) times daily.  60 tablet  6  . HYDROcodone-acetaminophen (NORCO) 10-325 MG per tablet Take 1 tablet by mouth 4 (four) times daily as needed. For breakthrough pain      . IRON CR PO Take 324 mg by mouth 2 (two) times daily.      Marland Kitchen levothyroxine (SYNTHROID, LEVOTHROID) 50 MCG tablet Take 50 mcg by mouth daily.      Marland Kitchen lubiprostone (AMITIZA) 24 MCG capsule Take 24 mcg by mouth 2 (two) times daily with a meal.      . meclizine (ANTIVERT) 12.5 MG tablet Take 12.5 mg by mouth 3 (three) times daily as needed. For nausea and vomiting      . nitroGLYCERIN (NITROSTAT) 0.4 MG SL tablet Place 0.4 mg under the tongue every 5 (five) minutes as needed. For chest pain      . omeprazole (PRILOSEC) 20 MG capsule Take 20 mg by mouth at bedtime.       . polyethylene glycol powder (GLYCOLAX/MIRALAX) powder Take 17 g by mouth daily.      . potassium chloride SA (K-DUR,KLOR-CON) 20 MEQ tablet Take 1 tablet (20 mEq total) by mouth 2 (two) times daily.  60 tablet  6  . rosuvastatin (CRESTOR) 5 MG tablet Take 5 mg by mouth daily.      . vitamin C (ASCORBIC ACID) 250 MG tablet Take 250 mg by mouth 2 (two) times daily.      . Vitamin D, Ergocalciferol, (DRISDOL) 50000 UNITS CAPS Take 50,000 Units by mouth every 30 (thirty) days.      . ciprofloxacin (CIPRO) 250 MG tablet Take 1 tablet (250 mg total) by mouth 2 (two) times daily.  3 tablet  0    Allergies: Allergies  Allergen Reactions  . Ramipril Hives  . Altace (Ramipril) Hives  . Contrast Media (Iodinated Diagnostic Agents) Other (See  Comments)    Reaction noted by md  . Heparin Other (See Comments)    Clots blood instead of thinning  . Hydromorphone Hcl Other (See Comments)    Crazy feeling    Social History:  The patient  reports that she has never smoked. She has never used smokeless tobacco. She reports that she does not drink alcohol or use illicit drugs.   ROS:  Please see the history of present illness.   All  other systems reviewed and negative.   PHYSICAL EXAM: VS:  BP 127/54  Pulse 75  Ht 5\' 4"  (1.626 m)  Wt 113 lb 12.8 oz (51.619 kg)  BMI 19.53 kg/m2 Well nourished, well developed, in no acute distress HEENT: normal Neck: no JVD at 90 Cardiac:  normal S1, S2; RRR; no murmur Lungs:  clear to auscultation bilaterally, no wheezing, rhonchi or rales Abd: soft, nontender, no hepatomegaly Ext: no edema Skin: warm and dry Neuro:  CNs 2-12 intact, no focal abnormalities noted  EKG:  AV paced, HR 75      ASSESSMENT AND PLAN:  1. Chronic Systolic CHF:   She is much improved since her admission to the hospital for IV diuresis augmented with inotropic therapy. Continue current therapy. She had a basic metabolic panel drawn at the nursing home yesterday. We will obtain those results. I will have her followup with Dr. Antoine Poche in the next 3-4 weeks in [place].  2. Atrial Fibrillation:   She is maintaining sinus rhythm. She is not a Coumadin candidate. She remains on amiodarone.   3. Chronic Kidney Disease:   As noted, she had a repeat basic metabolic panel drawn yesterday. We will obtain those  results.  4. Coronary Artery Disease:   No angina. Continue aspirin and statin.  5. Hypertension:   Controlled.  Continue current therapy.   6. Hypothyroidism:   Managed by primary care.  7. Shoulder Pain:  Managed by PCP.   8. UTI:   She developed this in the hospital and she was treated with 7 days of Cipro.   Signed, Tereso Newcomer, PA-C  3:31 PM 06/16/2012

## 2012-06-17 ENCOUNTER — Encounter: Payer: Self-pay | Admitting: Cardiology

## 2012-06-26 ENCOUNTER — Ambulatory Visit: Payer: Medicare Other | Admitting: Cardiology

## 2012-07-09 ENCOUNTER — Ambulatory Visit (INDEPENDENT_AMBULATORY_CARE_PROVIDER_SITE_OTHER): Payer: Medicare (Managed Care) | Admitting: Cardiology

## 2012-07-09 ENCOUNTER — Encounter: Payer: Self-pay | Admitting: Cardiology

## 2012-07-09 VITALS — BP 143/68 | HR 74 | Ht 64.0 in | Wt 112.0 lb

## 2012-07-09 DIAGNOSIS — I10 Essential (primary) hypertension: Secondary | ICD-10-CM

## 2012-07-09 DIAGNOSIS — I5022 Chronic systolic (congestive) heart failure: Secondary | ICD-10-CM

## 2012-07-09 DIAGNOSIS — N184 Chronic kidney disease, stage 4 (severe): Secondary | ICD-10-CM

## 2012-07-09 DIAGNOSIS — I2510 Atherosclerotic heart disease of native coronary artery without angina pectoris: Secondary | ICD-10-CM

## 2012-07-09 DIAGNOSIS — I5023 Acute on chronic systolic (congestive) heart failure: Secondary | ICD-10-CM

## 2012-07-09 DIAGNOSIS — I4891 Unspecified atrial fibrillation: Secondary | ICD-10-CM

## 2012-07-09 DIAGNOSIS — M1990 Unspecified osteoarthritis, unspecified site: Secondary | ICD-10-CM

## 2012-07-09 NOTE — Patient Instructions (Addendum)
The current medical regimen is effective;  continue present plan and medications.  Please have blood work Friday or the following Monday.  Follow up in 3 months with Dr Antoine Poche.

## 2012-07-09 NOTE — Progress Notes (Signed)
HPI The patient for followup of her recent hospitalization for management of congestive heart failure and renal insufficiency. At that time she was treated briefly with inotropic therapy and diuresis. She was having failure to thrive and now says that she feels better than she has in years. He is weighing herself most days and it looks like she is maintaining a baseline weight of about 112 pounds. She's having no swelling. She denies any shortness of breath. She is unsteady on her feet and somewhat slow but this is not an acute issue. She is not describing palpitations, presyncope or syncope. She is not having any chest pressure, neck or arm discomfort. Since getting a steroid injection in her shoulder she used not in acute pain anymore. She is living with her daughters and is out of rehabilitation.   Allergies  Allergen Reactions  . Ramipril Hives  . Altace (Ramipril) Hives  . Contrast Media (Iodinated Diagnostic Agents) Other (See Comments)    Reaction noted by md  . Heparin Other (See Comments)    Clots blood instead of thinning  . Hydromorphone Hcl Other (See Comments)    Crazy feeling    Current Outpatient Prescriptions  Medication Sig Dispense Refill  . ALPRAZolam (XANAX) 0.25 MG tablet Take 1 tablet (0.25 mg total) by mouth at bedtime as needed. For sleep  30 tablet  0  . amiodarone (PACERONE) 100 MG tablet Take 100 mg by mouth daily.      Marland Kitchen aspirin EC 81 MG tablet Take 1 tablet (81 mg total) by mouth daily.      . bisacodyl (DULCOLAX) 5 MG EC tablet Take 5 mg by mouth daily as needed. For constipation      . carvedilol (COREG) 12.5 MG tablet Take 1 tablet (12.5 mg total) by mouth 2 (two) times daily with a meal.  120 tablet  3  . Cyanocobalamin (VITAMIN B-12 IJ) Inject 1,000 mcg as directed. Every two weeks.      . furosemide (LASIX) 40 MG tablet Take 1 tablet (40 mg total) by mouth 2 (two) times daily.  60 tablet  6  . HYDROcodone-acetaminophen (NORCO) 10-325 MG per tablet Take  1 tablet by mouth 4 (four) times daily as needed. For breakthrough pain      . levothyroxine (SYNTHROID, LEVOTHROID) 50 MCG tablet Take 50 mcg by mouth daily.      Marland Kitchen lubiprostone (AMITIZA) 24 MCG capsule Take 24 mcg by mouth 2 (two) times daily with a meal.      . meclizine (ANTIVERT) 12.5 MG tablet Take 12.5 mg by mouth 3 (three) times daily as needed. For nausea and vomiting      . nitroGLYCERIN (NITROSTAT) 0.4 MG SL tablet Place 0.4 mg under the tongue every 5 (five) minutes as needed. For chest pain      . omeprazole (PRILOSEC) 20 MG capsule Take 20 mg by mouth at bedtime.       . polyethylene glycol powder (GLYCOLAX/MIRALAX) powder Take 17 g by mouth daily.      . potassium chloride SA (K-DUR,KLOR-CON) 20 MEQ tablet Take 1 tablet (20 mEq total) by mouth 2 (two) times daily.  60 tablet  6  . rosuvastatin (CRESTOR) 5 MG tablet Take 5 mg by mouth daily.      . Vitamin D, Ergocalciferol, (DRISDOL) 50000 UNITS CAPS Take 50,000 Units by mouth every 30 (thirty) days.        Past Medical History  Diagnosis Date  . Coronary artery disease  status post LAD stenting in 1999 after NSTEMI; CABG 2004; Last LHC 7/11: Proximal LAD 80%, mid 95%, distal 40%, mid R. High 40%, proximal RCA 40%, LIMA-LAD patent, SVG-PDA patent, EF 65%.   . Hypertension   . Hyperlipidemia   . Carotid artery disease     status post right carotid endarterectomy.  . Degenerative joint disease   . Restless leg syndrome   . Mild renal insufficiency   . Heparin induced thrombocytopenia   . Diphtheria     "as a child"  . Pleural effusion, bilateral 06/2003  . Pseudoaneurysm     status post repair following catherization in 1999  . Chronic systolic heart failure 10/02/11  . Ischemic cardiomyopathy     Echocardiogram 10/01/11: Mild LVH, EF 35-40%, basal inferior, basal to mid posterior and basal to mid anterolateral severe hypokinesis, mild to moderate AI, moderate MR (ischemic MR), moderate LAE, mild RVE, mildly reduced RV  systolic function, mild RAE, PASP 43-44  . COPD (chronic obstructive pulmonary disease)   . Atrial fibrillation/flutter     s/p multiple DCCVs;  amiodarone started 09/2011, TEE DCCV 3/13;  amio and coumadin d/c'd after admxn to [facility] 02/2012 with frequent falls/syncope  . Tachycardia-bradycardia syndrome     s/p Medtronic pacemaker implant 09/2011 (8 sec pause noted)  . GERD (gastroesophageal reflux disease)   . Renal artery stenosis     bilateral- status post stenting  . Non-functioning kidney   . Depression     "cries alot"  . Gout     Past Surgical History  Procedure Date  . Carotid endarterectomy 05/2003    right  . Pacemaker insertion 10/02/11    implanted by Dr Johney Frame  . Cholecystectomy ?02/2011  . Tonsillectomy and adenoidectomy   . Renal artery stent 06/2003    bilaterally/e-chart  . Thoracentesis ? 2000; 05/2011  . Coronary angioplasty with stent placement 1999  . Av fistula repair 01/1998    w/pseudoaneurysm repair S/P catheterization/E-chart  . Coronary artery bypass graft 05/2003    CABG X 3  . Tee without cardioversion 10/23/2011    Procedure: TRANSESOPHAGEAL ECHOCARDIOGRAM (TEE);  Surgeon: Wendall Stade, MD;  Location: [facility] ENDOSCOPY;  Service: Cardiovascular;  Laterality: N/A;  . Cardioversion 10/23/2011    Procedure: CARDIOVERSION;  Surgeon: Wendall Stade, MD;  Location: [facility] ENDOSCOPY;  Service: Cardiovascular;  Laterality: N/A;    ROS:  Otherwise as stated in the HPI and negative for all other systems.  PHYSICAL EXAM BP 143/68  Pulse 74  Ht 5\' 4"  (1.626 m)  Wt 112 lb (50.803 kg)  BMI 19.22 kg/m2 PHYSICAL EXAM GEN:  Frail distress NECK:  No jugular venous distention at 90 degrees, waveform within normal limits, carotid upstroke brisk and symmetric, no bruits, no thyromegaly LYMPHATICS:  No cervical adenopathy LUNGS:  Clear to auscultation bilaterally BACK:  No CVA tenderness CHEST:  Unremarkable HEART:  S1 and S2 within normal limits, no S3, no S4, no clicks,  no rubs, no murmurs ABD:  Positive bowel sounds normal in frequency in pitch, no bruits, no rebound, no guarding, unable to assess midline mass or bruit with the patient seated. EXT:  2 plus pulses throughout, no edema, no cyanosis no clubbing, diffuse muscle wasting SKIN:  No rashes no nodules NEURO:  Cranial nerves II through XII grossly intact, motor grossly intact throughout PSYCH:  Cognitively intact, oriented to person place and time  ASSESSMENT AND PLAN  Failure to thrive - She is now doing much better. No change in  therapy is indicated.  Cardiomyopathy - I had another long discussion with her daughter about daily weights and when necessary dosing of diuretic. Today no change in therapy is indicated.  RENAL INSUFFICIENCY -  Her creat was up slightly a couple of weeks ago.  I will repeat one on Friday and I will keep a close eye on this.   Atrial fibrillation with RVR -  She seems to be maintaining sinus rhythm. She will continue the medications as listed I will make sure she has appropriate amiodarone followup.  CAD -  The patient has no new sypmtoms. No further cardiovascular testing is indicated. We will continue with aggressive risk reduction and meds as listed.

## 2012-07-16 ENCOUNTER — Other Ambulatory Visit: Payer: Self-pay | Admitting: Cardiology

## 2012-07-16 LAB — BASIC METABOLIC PANEL
BUN: 40 mg/dL — ABNORMAL HIGH (ref 6–23)
CO2: 29 mEq/L (ref 19–32)
Calcium: 9.4 mg/dL (ref 8.4–10.5)
Chloride: 96 mEq/L (ref 96–112)
Creat: 1.98 mg/dL — ABNORMAL HIGH (ref 0.50–1.10)

## 2012-08-04 ENCOUNTER — Emergency Department (HOSPITAL_COMMUNITY): Payer: Medicare Other

## 2012-08-04 ENCOUNTER — Emergency Department (HOSPITAL_COMMUNITY)
Admission: EM | Admit: 2012-08-04 | Discharge: 2012-08-04 | Disposition: A | Discharge status: Home / Self Care | Payer: Medicare Other | Attending: Emergency Medicine | Admitting: Emergency Medicine

## 2012-08-04 ENCOUNTER — Encounter (HOSPITAL_COMMUNITY): Payer: Self-pay | Admitting: Radiology

## 2012-08-04 DIAGNOSIS — E785 Hyperlipidemia, unspecified: Secondary | ICD-10-CM | POA: Insufficient documentation

## 2012-08-04 DIAGNOSIS — J4489 Other specified chronic obstructive pulmonary disease: Secondary | ICD-10-CM | POA: Insufficient documentation

## 2012-08-04 DIAGNOSIS — R0602 Shortness of breath: Secondary | ICD-10-CM | POA: Insufficient documentation

## 2012-08-04 DIAGNOSIS — R0789 Other chest pain: Secondary | ICD-10-CM | POA: Insufficient documentation

## 2012-08-04 DIAGNOSIS — Z8619 Personal history of other infectious and parasitic diseases: Secondary | ICD-10-CM | POA: Insufficient documentation

## 2012-08-04 DIAGNOSIS — J449 Chronic obstructive pulmonary disease, unspecified: Secondary | ICD-10-CM | POA: Insufficient documentation

## 2012-08-04 DIAGNOSIS — Z8639 Personal history of other endocrine, nutritional and metabolic disease: Secondary | ICD-10-CM | POA: Insufficient documentation

## 2012-08-04 DIAGNOSIS — N289 Disorder of kidney and ureter, unspecified: Secondary | ICD-10-CM

## 2012-08-04 DIAGNOSIS — I701 Atherosclerosis of renal artery: Secondary | ICD-10-CM | POA: Insufficient documentation

## 2012-08-04 DIAGNOSIS — Z8709 Personal history of other diseases of the respiratory system: Secondary | ICD-10-CM | POA: Insufficient documentation

## 2012-08-04 DIAGNOSIS — Z8669 Personal history of other diseases of the nervous system and sense organs: Secondary | ICD-10-CM | POA: Insufficient documentation

## 2012-08-04 DIAGNOSIS — M542 Cervicalgia: Secondary | ICD-10-CM | POA: Insufficient documentation

## 2012-08-04 DIAGNOSIS — Z862 Personal history of diseases of the blood and blood-forming organs and certain disorders involving the immune mechanism: Secondary | ICD-10-CM | POA: Insufficient documentation

## 2012-08-04 DIAGNOSIS — Z951 Presence of aortocoronary bypass graft: Secondary | ICD-10-CM | POA: Insufficient documentation

## 2012-08-04 DIAGNOSIS — N390 Urinary tract infection, site not specified: Secondary | ICD-10-CM

## 2012-08-04 DIAGNOSIS — Z950 Presence of cardiac pacemaker: Secondary | ICD-10-CM | POA: Insufficient documentation

## 2012-08-04 DIAGNOSIS — I10 Essential (primary) hypertension: Secondary | ICD-10-CM | POA: Insufficient documentation

## 2012-08-04 DIAGNOSIS — I2510 Atherosclerotic heart disease of native coronary artery without angina pectoris: Secondary | ICD-10-CM | POA: Insufficient documentation

## 2012-08-04 DIAGNOSIS — J3489 Other specified disorders of nose and nasal sinuses: Secondary | ICD-10-CM | POA: Insufficient documentation

## 2012-08-04 DIAGNOSIS — Z7982 Long term (current) use of aspirin: Secondary | ICD-10-CM | POA: Insufficient documentation

## 2012-08-04 DIAGNOSIS — Z8679 Personal history of other diseases of the circulatory system: Secondary | ICD-10-CM | POA: Insufficient documentation

## 2012-08-04 DIAGNOSIS — I5022 Chronic systolic (congestive) heart failure: Secondary | ICD-10-CM | POA: Insufficient documentation

## 2012-08-04 DIAGNOSIS — Z9861 Coronary angioplasty status: Secondary | ICD-10-CM | POA: Insufficient documentation

## 2012-08-04 DIAGNOSIS — I4891 Unspecified atrial fibrillation: Secondary | ICD-10-CM | POA: Insufficient documentation

## 2012-08-04 DIAGNOSIS — K219 Gastro-esophageal reflux disease without esophagitis: Secondary | ICD-10-CM | POA: Insufficient documentation

## 2012-08-04 DIAGNOSIS — R51 Headache: Secondary | ICD-10-CM | POA: Insufficient documentation

## 2012-08-04 DIAGNOSIS — M1990 Unspecified osteoarthritis, unspecified site: Secondary | ICD-10-CM | POA: Insufficient documentation

## 2012-08-04 DIAGNOSIS — Z792 Long term (current) use of antibiotics: Secondary | ICD-10-CM | POA: Insufficient documentation

## 2012-08-04 DIAGNOSIS — Z79899 Other long term (current) drug therapy: Secondary | ICD-10-CM | POA: Insufficient documentation

## 2012-08-04 DIAGNOSIS — R5381 Other malaise: Secondary | ICD-10-CM | POA: Insufficient documentation

## 2012-08-04 LAB — CBC WITH DIFFERENTIAL/PLATELET
Basophils Absolute: 0 10*3/uL (ref 0.0–0.1)
Basophils Relative: 0 % (ref 0–1)
Eosinophils Absolute: 0.2 10*3/uL (ref 0.0–0.7)
Eosinophils Relative: 2 % (ref 0–5)
HCT: 32.4 % — ABNORMAL LOW (ref 36.0–46.0)
Hemoglobin: 10.3 g/dL — ABNORMAL LOW (ref 12.0–15.0)
Lymphocytes Relative: 15 % (ref 12–46)
Lymphs Abs: 1.3 K/uL (ref 0.7–4.0)
MCH: 27.9 pg (ref 26.0–34.0)
MCHC: 31.8 g/dL (ref 30.0–36.0)
MCV: 87.8 fL (ref 78.0–100.0)
Monocytes Absolute: 0.9 K/uL (ref 0.1–1.0)
Monocytes Relative: 11 % (ref 3–12)
Neutro Abs: 6 10*3/uL (ref 1.7–7.7)
Neutrophils Relative %: 72 % (ref 43–77)
Platelets: 247 10*3/uL (ref 150–400)
RBC: 3.69 MIL/uL — ABNORMAL LOW (ref 3.87–5.11)
RDW: 20 % — ABNORMAL HIGH (ref 11.5–15.5)
WBC: 8.4 K/uL (ref 4.0–10.5)

## 2012-08-04 LAB — BASIC METABOLIC PANEL
Chloride: 95 mEq/L — ABNORMAL LOW (ref 96–112)
GFR calc Af Amer: 27 mL/min — ABNORMAL LOW (ref >90)
GFR calc non Af Amer: 24 mL/min — ABNORMAL LOW (ref >90)
Potassium: 4.3 mEq/L (ref 3.5–5.1)
Sodium: 133 mEq/L — ABNORMAL LOW (ref 135–145)

## 2012-08-04 LAB — URINALYSIS, ROUTINE W REFLEX MICROSCOPIC
Bilirubin Urine: NEGATIVE
Glucose, UA: NEGATIVE mg/dL
Hgb urine dipstick: NEGATIVE
Ketones, ur: NEGATIVE mg/dL
Nitrite: NEGATIVE
Protein, ur: NEGATIVE mg/dL
Specific Gravity, Urine: 1.011 (ref 1.005–1.030)
Urobilinogen, UA: 1 mg/dL (ref 0.0–1.0)
pH: 6.5 (ref 5.0–8.0)

## 2012-08-04 LAB — BASIC METABOLIC PANEL WITH GFR
BUN: 39 mg/dL — ABNORMAL HIGH (ref 6–23)
CO2: 27 meq/L (ref 19–32)
Calcium: 9.5 mg/dL (ref 8.4–10.5)
Creatinine, Ser: 1.86 mg/dL — ABNORMAL HIGH (ref 0.50–1.10)
Glucose, Bld: 113 mg/dL — ABNORMAL HIGH (ref 70–99)

## 2012-08-04 LAB — URINE MICROSCOPIC-ADD ON

## 2012-08-04 LAB — TROPONIN I: Troponin I: <0.3 ng/mL (ref <0.30)

## 2012-08-04 LAB — PRO B NATRIURETIC PEPTIDE: Pro B Natriuretic peptide (BNP): 8068 pg/mL — ABNORMAL HIGH (ref 0–450)

## 2012-08-04 MED ORDER — ACETAMINOPHEN 325 MG PO TABS
650.0000 mg | ORAL_TABLET | Freq: Once | ORAL | Status: AC
  Administered 2012-08-04: 650 mg via ORAL
  Filled 2012-08-04: qty 2

## 2012-08-04 MED ORDER — DEXTROSE 5 % IV SOLN
1.0000 g | Freq: Once | INTRAVENOUS | Status: AC
  Administered 2012-08-04: 1 g via INTRAVENOUS
  Filled 2012-08-04: qty 10

## 2012-08-04 MED ORDER — CEPHALEXIN 500 MG PO CAPS: 500.0000 mg | ORAL_CAPSULE | Freq: Three times a day (TID) | ORAL | Status: DC

## 2012-08-04 NOTE — ED Notes (Signed)
Med given for c/o headache. Daughters remain at bedside

## 2012-08-04 NOTE — ED Notes (Signed)
Pt released from [facility]  3 weeks ago to home with daughter.  Tonight c/o general aches and headache x 3 days. Denies any chest pain or shob

## 2012-08-04 NOTE — ED Notes (Signed)
Pt remains in xray.

## 2012-08-04 NOTE — ED Notes (Signed)
Pt resting quietly. Taking ice chips without problem. Family remains at bedside

## 2012-08-04 NOTE — ED Notes (Signed)
Patient transported to CT 

## 2012-08-04 NOTE — ED Notes (Signed)
Pt getting antibiotics prior to discharge

## 2012-08-04 NOTE — ED Provider Notes (Signed)
History     CSN: [id number]  Arrival date & time 08/04/12  0024   First MD Initiated Contact with Patient 08/04/12 [phone]      Chief Complaint  Patient presents with  . Generalized Body Aches  . Headache    (Consider location/radiation/quality/duration/timing/severity/associated sxs/prior treatment) HPI Comments: Patient recently discharged to home about one month ago from a rehabilitation facility. Patient has significant history of coronary disease, congestive heart failure, hypertension. She is followed closely by the [facility] cardiology. She comes by EMS this evening complaining of feeling smothered for the last several days, shortness of breath, fatigue. She has had a posterior headache and posterior neck discomfort for several days to a couple of weeks. She denies sinus pressure and only mild nasal congestion. No sore throat or significant coughing. Patient and family deny any peripheral edema. The patient was given one extra Lasix tonight by daughter do to her feeling smothered. She was given nitroglycerin by EMS and the patient reports that her headache and posterior neck pain seems slightly worse which she attributes to receiving the nitroglycerin. She initially told me that she had not had any chest pain but then reports that she did when I inquired why EMS gave her nitroglycerin. She reports no chest pain at this moment in time. She denies any focal numbness or weakness of her extremities, just generalized fatigue. Unknown if she has been febrile at home. No unusual diaphoresis, no nausea vomiting or diarrhea.  Patient is a 78 y.o. female presenting with headaches. The history is provided by the patient, a relative and medical records.  Headache  Associated symptoms include shortness of breath. Pertinent negatives include no fever, no nausea and no vomiting.    Past Medical History  Diagnosis Date  . Coronary artery disease     status post LAD stenting in 1999 after NSTEMI; CABG  2004; Last LHC 7/11: Proximal LAD 80%, mid 95%, distal 40%, mid R. High 40%, proximal RCA 40%, LIMA-LAD patent, SVG-PDA patent, EF 65%.   . Hypertension   . Hyperlipidemia   . Carotid artery disease     status post right carotid endarterectomy.  . Degenerative joint disease   . Restless leg syndrome   . Mild renal insufficiency   . Heparin induced thrombocytopenia   . Diphtheria     "as a child"  . Pleural effusion, bilateral 06/2003  . Pseudoaneurysm     status post repair following catherization in 1999  . Chronic systolic heart failure 10/02/11  . Ischemic cardiomyopathy     Echocardiogram 10/01/11: Mild LVH, EF 35-40%, basal inferior, basal to mid posterior and basal to mid anterolateral severe hypokinesis, mild to moderate AI, moderate MR (ischemic MR), moderate LAE, mild RVE, mildly reduced RV systolic function, mild RAE, PASP 43-44  . COPD (chronic obstructive pulmonary disease)   . Atrial fibrillation/flutter     s/p multiple DCCVs;  amiodarone started 09/2011, TEE DCCV 3/13;  amio and coumadin d/c'd after admx[facility] to [facility] 02/2012 with frequent falls/syncope  . Tachycardia-bradycardia syndrome     s/p [employer] pacemaker implant 09/2011 (8 sec pause noted)  . GERD (gastroesophageal reflux disease)   . Renal artery stenosis     bilateral- status post stenting  . Non-functioning kidney   . Depression     "cries alot"  . Gout     Past Surgical History  Procedure Date  . Carotid endarterectomy 05/2003    right  . Pacemaker insertion 10/02/11    implanted by Dr Johney Frame  .  Cholecystectomy ?02/2011  . Tonsillectomy and adenoidectomy   . Renal artery stent 06/2003    bilaterally/e-chart  . Thoracentesis ? 2000; 05/2011  . Coronary angioplasty with stent placement 1999  . Av fistula repair 01/1998    w/pseudoaneurysm repair S/P catheterization/E-chart  . Coronary artery bypass graft 05/2003    CABG X 3  . Tee without cardioversion 10/23/2011    Procedure: TRANSESOPHAGEAL  ECHOCARDIOGRAM (TEE);  Surgeon: Wendall Stade, MD;  Location: [facility] ENDOSCOPY;  Service: Cardiovascular;  Laterality: N/A;  . Cardioversion 10/23/2011    Procedure: CARDIOVERSION;  Surgeon: Wendall Stade, MD;  Location: [facility] ENDOSCOPY;  Service: Cardiovascular;  Laterality: N/A;    Family History  Problem Relation Age of Onset  . Cancer Mother 38    died  . Lung disease Father 61    died  . Heart disease Sister     2 sisters and her son  . Anesthesia problems Neg Hx     History  Substance Use Topics  . Smoking status: Never Smoker   . Smokeless tobacco: Never Used  . Alcohol Use: No    OB History    Grav Para Term Preterm Abortions TAB SAB Ect Mult Living                  Review of Systems  Constitutional: Positive for fatigue. Negative for fever and chills.  HENT: Positive for congestion and neck pain. Negative for sore throat, rhinorrhea, neck stiffness and sinus pressure.   Respiratory: Positive for shortness of breath. Negative for cough.   Cardiovascular: Positive for chest pain.  Gastrointestinal: Negative for nausea, vomiting, diarrhea and constipation.  Musculoskeletal: Negative for back pain.  Skin: Negative for rash.  Neurological: Positive for headaches.  All other systems reviewed and are negative.    Allergies  Ramipril; Altace; Contrast media; Heparin; and Hydromorphone hcl  Home Medications   Current Outpatient Rx  Name  Route  Sig  Dispense  Refill  . ALPRAZOLAM 0.25 MG PO TABS   Oral   Take 1 tablet (0.25 mg total) by mouth at bedtime as needed. For sleep   30 tablet   0   . AMIODARONE HCL 100 MG PO TABS   Oral   Take 100 mg by mouth daily.         . ASPIRIN EC 81 MG PO TBEC   Oral   Take 1 tablet (81 mg total) by mouth daily.         Marland Kitchen BISACODYL 5 MG PO TBEC   Oral   Take 5 mg by mouth daily as needed. For constipation         . CARVEDILOL 12.5 MG PO TABS   Oral   Take 1 tablet (12.5 mg total) by mouth 2 (two) times daily with a  meal.   120 tablet   3   . VITAMIN B-12 IJ   Injection   Inject 1,000 mcg as directed See admin instructions. Every two weeks.         . FUROSEMIDE 40 MG PO TABS   Oral   Take 1 tablet (40 mg total) by mouth 2 (two) times daily.   60 tablet   6   . HYDROCODONE-ACETAMINOPHEN 10-325 MG PO TABS   Oral   Take 1 tablet by mouth 4 (four) times daily as needed. For breakthrough pain         . LEVOTHYROXINE SODIUM 50 MCG PO TABS   Oral   Take 50  mcg by mouth daily.         . LUBIPROSTONE 24 MCG PO CAPS   Oral   Take 24 mcg by mouth 2 (two) times daily with a meal.         . MECLIZINE HCL 12.5 MG PO TABS   Oral   Take 12.5 mg by mouth 3 (three) times daily as needed. For nausea and vomiting         . OMEPRAZOLE 20 MG PO CPDR   Oral   Take 20 mg by mouth at bedtime.          Marland Kitchen POLYETHYLENE GLYCOL 3350 PO POWD   Oral   Take 17 g by mouth daily.         Marland Kitchen POTASSIUM CHLORIDE CRYS ER 20 MEQ PO TBCR   Oral   Take 1 tablet (20 mEq total) by mouth 2 (two) times daily.   60 tablet   6   . ROSUVASTATIN CALCIUM 5 MG PO TABS   Oral   Take 5 mg by mouth daily.         Marland Kitchen VITAMIN D (ERGOCALCIFEROL) 50000 UNITS PO CAPS   Oral   Take 50,000 Units by mouth every 30 (thirty) days.         . CEPHALEXIN 500 MG PO CAPS   Oral   Take 1 capsule (500 mg total) by mouth 3 (three) times daily.   21 capsule   0   . NITROGLYCERIN 0.4 MG SL SUBL   Sublingual   Place 0.4 mg under the tongue every 5 (five) minutes as needed. For chest pain           BP 118/60  Pulse 78  Temp 98.8 F (37.1 C) (Oral)  Resp 20  SpO2 97%  Physical Exam  Nursing note and vitals reviewed. Constitutional: She appears well-developed and well-nourished.  Non-toxic appearance. She does not have a sickly appearance. She does not appear ill.  HENT:  Head: Normocephalic and atraumatic.  Nose: Right sinus exhibits no maxillary sinus tenderness and no frontal sinus tenderness. Left sinus  exhibits no maxillary sinus tenderness and no frontal sinus tenderness.  Mouth/Throat: Uvula is midline and oropharynx is clear and moist.  Eyes: EOM are normal. Pupils are equal, round, and reactive to light. No scleral icterus.  Neck: Normal range of motion. Neck supple.  Cardiovascular: Normal rate and regular rhythm.   No murmur heard. Pulmonary/Chest: Effort normal. No respiratory distress. She has no wheezes. She has no rales.  Abdominal: Normal appearance and bowel sounds are normal. She exhibits no distension. There is no tenderness. There is no rebound and no guarding.  Musculoskeletal: She exhibits no edema and no tenderness.  Neurological: She is alert. She displays no atrophy. No cranial nerve deficit or sensory deficit. She exhibits normal muscle tone. Coordination normal.  Skin: Skin is warm and dry.    ED Course  Procedures (including critical care time)  Labs Reviewed  CBC WITH DIFFERENTIAL - Abnormal; Notable for the following:    RBC 3.69 (*)     Hemoglobin 10.3 (*)     HCT 32.4 (*)     RDW 20.0 (*)     All other components within normal limits  BASIC METABOLIC PANEL - Abnormal; Notable for the following:    Sodium 133 (*)     Chloride 95 (*)     Glucose, Bld 113 (*)     BUN 39 (*)     Creatinine, Ser  1.86 (*)     GFR calc non Af Amer 24 (*)     GFR calc Af Amer 27 (*)     All other components within normal limits  URINALYSIS, ROUTINE W REFLEX MICROSCOPIC - Abnormal; Notable for the following:    APPearance CLOUDY (*)     Leukocytes, UA LARGE (*)     All other components within normal limits  PRO B NATRIURETIC PEPTIDE - Abnormal; Notable for the following:    Pro B Natriuretic peptide (BNP) 8068.0 (*)     All other components within normal limits  URINE MICROSCOPIC-ADD ON - Abnormal; Notable for the following:    Squamous Epithelial / LPF FEW (*)     Bacteria, UA MANY (*)     Casts HYALINE CASTS (*)     All other components within normal limits  TROPONIN  I  URINE CULTURE   Dg Chest 2 View  08/04/2012  *RADIOLOGY REPORT*  Clinical Data: Generalized aches and headache; weakness.  CHEST - 2 VIEW  Comparison: Chest radiograph performed 10/22 1013  Findings: The lungs are well-aerated.  Mild right midlung airspace opacity could reflect atelectasis or scarring, or could simply reflect the overlying scapula.  There is no evidence of pleural effusion or pneumothorax.  The heart is borderline enlarged.  The patient is status post median sternotomy, with evidence of prior CABG.  A pacemaker is noted at the left chest wall, with leads ending at the right atrium and right ventricle.  There is chronic deformity involving the proximal left humerus, with mild apparent healing response.  IMPRESSION:  1.  Mild right midlung airspace opacity could reflect atelectasis or scarring, or could simply reflect the overlying scapula.  No definite acute focal airspace consolidation seen. 2.  Borderline cardiomegaly.   Original Report Authenticated By: Tonia Ghent, M.D.    Ct Head Wo Contrast  08/04/2012  *RADIOLOGY REPORT*  Clinical Data: Generalized aches and headache.  CT HEAD WITHOUT CONTRAST  Technique:  Contiguous axial images were obtained from the base of the skull through the vertex without contrast.  Comparison: CT of the head performed 02/18/2012  Findings: There is no evidence of acute infarction, mass lesion, or intra- or extra-axial hemorrhage on CT.  Prominence of the ventricles and sulci reflects mild cortical volume loss.  Diffuse periventricular and subcortical white matter change likely reflects small vessel ischemic microangiopathy. Cerebellar atrophy is noted.  A chronic infarct is noted at the anterior left basal ganglia.  The brainstem and fourth ventricle are within normal limits.  The cerebral hemispheres demonstrate grossly normal gray-white differentiation.  No mass effect or midline shift is seen.   There is no evidence of fracture; visualized osseous  structures are unremarkable in appearance.  The visualized portions of the orbits are within normal limits.  The paranasal sinuses and mastoid air cells are well-aerated.  No significant soft tissue abnormalities are seen.  IMPRESSION:  1.  No acute intracranial pathology seen on CT. 2.  Mild cortical volume loss and diffuse small vessel ischemic microangiopathy. 3.  Chronic infarct at the anterior left basal ganglia.   Original Report Authenticated By: Tonia Ghent, M.D.      1. Urinary tract infection   2. Renal insufficiency     Oxygen saturation on nasal cannula O2 is 100% which I interpret to be adequate.  5 AM Pt reports feeling improved and wanting to go home by has not provided UA.  7AM UA suggestive of UTI which likely explains symptoms  of fatigue, weakness.  CXR is ok, sats ok, no cardiac ischemia noted, and labs are at or near baseline.  Pt given initial dose of IV abx and Rx for UTI to complete 7 day course.  Followup with pcp at end of week.  MDM  Pt with stable vitals, no fever.  No peripheral edema, denies sig CP.  Given prior history and age and symptoms of dysuria, will check CXR, head CT, UA, and basic labs to assess for worsening renal failure, anemia, hyponatremia.         Gavin Pound. Oletta Lamas, MD 08/04/12 1208

## 2012-08-04 NOTE — ED Notes (Signed)
Pt states she is feeling better and is ready to go home. amb pt to [facility] for UA. Obtained and sent. Pt tolerated well

## 2012-08-04 NOTE — Discharge Instructions (Signed)
Urinary Tract Infection Urinary tract infections (UTIs) can develop anywhere along your urinary tract. Your urinary tract is your body's drainage system for removing wastes and extra water. Your urinary tract includes two kidneys, two ureters, a bladder, and a urethra. Your kidneys are a pair of bean-shaped organs. Each kidney is about the size of your fist. They are located below your ribs, one on each side of your spine. CAUSES Infections are caused by microbes, which are microscopic organisms, including fungi, viruses, and bacteria. These organisms are so small that they can only be seen through a microscope. Bacteria are the microbes that most commonly cause UTIs. SYMPTOMS  Symptoms of UTIs may vary by age and gender of the patient and by the location of the infection. Symptoms in young women typically include a frequent and intense urge to urinate and a painful, burning feeling in the bladder or urethra during urination. Older women and men are more likely to be tired, shaky, and weak and have muscle aches and abdominal pain. A fever may mean the infection is in your kidneys. Other symptoms of a kidney infection include pain in your back or sides below the ribs, nausea, and vomiting. DIAGNOSIS To diagnose a UTI, your caregiver will ask you about your symptoms. Your caregiver also will ask to provide a urine sample. The urine sample will be tested for bacteria and white blood cells. White blood cells are made by your body to help fight infection. TREATMENT  Typically, UTIs can be treated with medication. Because most UTIs are caused by a bacterial infection, they usually can be treated with the use of antibiotics. The choice of antibiotic and length of treatment depend on your symptoms and the type of bacteria causing your infection. HOME CARE INSTRUCTIONS  If you were prescribed antibiotics, take them exactly as your caregiver instructs you. Finish the medication even if you feel better after you  have only taken some of the medication.  Drink enough water and fluids to keep your urine clear or pale yellow.  Avoid caffeine, tea, and carbonated beverages. They tend to irritate your bladder.  Empty your bladder often. Avoid holding urine for long periods of time.  Empty your bladder before and after sexual intercourse.  After a bowel movement, women should cleanse from front to back. Use each tissue only once. SEEK MEDICAL CARE IF:   You have back pain.  You develop a fever.  Your symptoms do not begin to resolve within 3 days. SEEK IMMEDIATE MEDICAL CARE IF:   You have severe back pain or lower abdominal pain.  You develop chills.  You have nausea or vomiting.  You have continued burning or discomfort with urination. MAKE SURE YOU:   Understand these instructions.  Will watch your condition.  Will get help right away if you are not doing well or get worse. Document Released: 05/09/2005 Document Revised: 01/29/2012 Document Reviewed: 09/07/2011 [employer] Patient Information 2013 [employer].  

## 2012-08-04 NOTE — ED Notes (Signed)
Pt sitting up drinking coffee. States she feels good and wants to go home.

## 2012-08-04 NOTE — ED Notes (Signed)
Daughter to bedside

## 2012-08-06 LAB — URINE CULTURE: Colony Count: >=100000

## 2012-08-07 NOTE — ED Notes (Signed)
+   urine Patient treated with Keflex-sensitive to same-chart appended per protocol MD. 

## 2012-08-24 ENCOUNTER — Encounter (HOSPITAL_COMMUNITY): Payer: Self-pay | Admitting: Emergency Medicine

## 2012-08-24 ENCOUNTER — Emergency Department (HOSPITAL_COMMUNITY): Payer: Medicare Other

## 2012-08-24 ENCOUNTER — Inpatient Hospital Stay (HOSPITAL_COMMUNITY)
Admission: EM | Admit: 2012-08-24 | Discharge: 2012-08-29 | DRG: 292 | Disposition: A | Discharge status: Home / Self Care | Payer: Medicare Other | Attending: Internal Medicine | Admitting: Internal Medicine

## 2012-08-24 DIAGNOSIS — N390 Urinary tract infection, site not specified: Secondary | ICD-10-CM

## 2012-08-24 DIAGNOSIS — Z951 Presence of aortocoronary bypass graft: Secondary | ICD-10-CM

## 2012-08-24 DIAGNOSIS — J449 Chronic obstructive pulmonary disease, unspecified: Secondary | ICD-10-CM | POA: Diagnosis present

## 2012-08-24 DIAGNOSIS — F3289 Other specified depressive episodes: Secondary | ICD-10-CM | POA: Diagnosis present

## 2012-08-24 DIAGNOSIS — M109 Gout, unspecified: Secondary | ICD-10-CM | POA: Diagnosis present

## 2012-08-24 DIAGNOSIS — D7582 Heparin induced thrombocytopenia (HIT): Secondary | ICD-10-CM | POA: Diagnosis present

## 2012-08-24 DIAGNOSIS — M1990 Unspecified osteoarthritis, unspecified site: Secondary | ICD-10-CM | POA: Diagnosis present

## 2012-08-24 DIAGNOSIS — N183 Chronic kidney disease, stage 3 unspecified: Secondary | ICD-10-CM | POA: Diagnosis present

## 2012-08-24 DIAGNOSIS — D75829 Heparin-induced thrombocytopenia, unspecified: Secondary | ICD-10-CM | POA: Diagnosis present

## 2012-08-24 DIAGNOSIS — Z681 Body mass index (BMI) 19 or less, adult: Secondary | ICD-10-CM

## 2012-08-24 DIAGNOSIS — I5022 Chronic systolic (congestive) heart failure: Secondary | ICD-10-CM

## 2012-08-24 DIAGNOSIS — Z66 Do not resuscitate: Secondary | ICD-10-CM | POA: Diagnosis present

## 2012-08-24 DIAGNOSIS — I2510 Atherosclerotic heart disease of native coronary artery without angina pectoris: Secondary | ICD-10-CM | POA: Diagnosis present

## 2012-08-24 DIAGNOSIS — I701 Atherosclerosis of renal artery: Secondary | ICD-10-CM

## 2012-08-24 DIAGNOSIS — E785 Hyperlipidemia, unspecified: Secondary | ICD-10-CM | POA: Diagnosis present

## 2012-08-24 DIAGNOSIS — D649 Anemia, unspecified: Secondary | ICD-10-CM | POA: Diagnosis present

## 2012-08-24 DIAGNOSIS — N179 Acute kidney failure, unspecified: Secondary | ICD-10-CM

## 2012-08-24 DIAGNOSIS — R630 Anorexia: Secondary | ICD-10-CM | POA: Diagnosis present

## 2012-08-24 DIAGNOSIS — R0789 Other chest pain: Secondary | ICD-10-CM | POA: Diagnosis present

## 2012-08-24 DIAGNOSIS — I129 Hypertensive chronic kidney disease with stage 1 through stage 4 chronic kidney disease, or unspecified chronic kidney disease: Secondary | ICD-10-CM | POA: Diagnosis present

## 2012-08-24 DIAGNOSIS — Z950 Presence of cardiac pacemaker: Secondary | ICD-10-CM

## 2012-08-24 DIAGNOSIS — R339 Retention of urine, unspecified: Secondary | ICD-10-CM

## 2012-08-24 DIAGNOSIS — N184 Chronic kidney disease, stage 4 (severe): Secondary | ICD-10-CM | POA: Diagnosis present

## 2012-08-24 DIAGNOSIS — J4489 Other specified chronic obstructive pulmonary disease: Secondary | ICD-10-CM | POA: Diagnosis present

## 2012-08-24 DIAGNOSIS — G2581 Restless legs syndrome: Secondary | ICD-10-CM | POA: Diagnosis present

## 2012-08-24 DIAGNOSIS — I509 Heart failure, unspecified: Secondary | ICD-10-CM | POA: Diagnosis present

## 2012-08-24 DIAGNOSIS — F329 Major depressive disorder, single episode, unspecified: Secondary | ICD-10-CM | POA: Diagnosis present

## 2012-08-24 DIAGNOSIS — K219 Gastro-esophageal reflux disease without esophagitis: Secondary | ICD-10-CM | POA: Diagnosis present

## 2012-08-24 DIAGNOSIS — Z9861 Coronary angioplasty status: Secondary | ICD-10-CM

## 2012-08-24 DIAGNOSIS — I779 Disorder of arteries and arterioles, unspecified: Secondary | ICD-10-CM | POA: Diagnosis present

## 2012-08-24 DIAGNOSIS — I5023 Acute on chronic systolic (congestive) heart failure: Principal | ICD-10-CM | POA: Diagnosis present

## 2012-08-24 DIAGNOSIS — E876 Hypokalemia: Secondary | ICD-10-CM | POA: Diagnosis present

## 2012-08-24 DIAGNOSIS — K5900 Constipation, unspecified: Secondary | ICD-10-CM

## 2012-08-24 DIAGNOSIS — I4891 Unspecified atrial fibrillation: Secondary | ICD-10-CM

## 2012-08-24 DIAGNOSIS — I2589 Other forms of chronic ischemic heart disease: Secondary | ICD-10-CM | POA: Diagnosis present

## 2012-08-24 HISTORY — DX: Hypothyroidism, unspecified: E03.9

## 2012-08-24 HISTORY — DX: Anemia, unspecified: D64.9

## 2012-08-24 LAB — BASIC METABOLIC PANEL
Calcium: 9.2 mg/dL (ref 8.4–10.5)
Chloride: 96 mEq/L (ref 96–112)
Creatinine, Ser: 1.86 mg/dL — ABNORMAL HIGH (ref 0.50–1.10)
GFR calc Af Amer: 27 mL/min — ABNORMAL LOW (ref >90)
GFR calc non Af Amer: 24 mL/min — ABNORMAL LOW (ref >90)

## 2012-08-24 LAB — CBC WITH DIFFERENTIAL/PLATELET
Basophils Absolute: 0 10*3/uL (ref 0.0–0.1)
Basophils Relative: 0 % (ref 0–1)
Eosinophils Absolute: 0.1 10*3/uL (ref 0.0–0.7)
HCT: 30.5 % — ABNORMAL LOW (ref 36.0–46.0)
MCHC: 31.5 g/dL (ref 30.0–36.0)
Monocytes Absolute: 0.7 10*3/uL (ref 0.1–1.0)
Neutro Abs: 5.8 10*3/uL (ref 1.7–7.7)
Neutrophils Relative %: 76 % (ref 43–77)
RDW: 18.1 % — ABNORMAL HIGH (ref 11.5–15.5)

## 2012-08-24 LAB — POCT I-STAT TROPONIN I: Troponin i, poc: 0.01 ng/mL (ref 0.00–0.08)

## 2012-08-24 MED ORDER — NITROGLYCERIN 0.4 MG SL SUBL
0.4000 mg | SUBLINGUAL_TABLET | SUBLINGUAL | Status: DC | PRN
  Administered 2012-08-25: 0.4 mg via SUBLINGUAL
  Filled 2012-08-24: qty 25

## 2012-08-24 MED ORDER — MORPHINE SULFATE 2 MG/ML IJ SOLN
2.0000 mg | Freq: Once | INTRAMUSCULAR | Status: AC
  Administered 2012-08-24: 2 mg via INTRAVENOUS
  Filled 2012-08-24: qty 1

## 2012-08-24 MED ORDER — POTASSIUM CHLORIDE CRYS ER 20 MEQ PO TBCR
40.0000 meq | EXTENDED_RELEASE_TABLET | Freq: Once | ORAL | Status: AC
  Administered 2012-08-24: 40 meq via ORAL
  Filled 2012-08-24: qty 2

## 2012-08-24 NOTE — ED Notes (Signed)
Pt reports she has a UTI and was dx here about 2-3 weeks ago. Pt reports she is still having some dysuria but reports she took her antibiotics.

## 2012-08-24 NOTE — ED Provider Notes (Signed)
History     CSN: [id number]  Arrival date & time 08/24/12  2141   First MD Initiated Contact with Patient 08/24/12 2200      Chief Complaint  Patient presents with  . Chest Pain    (Consider location/radiation/quality/duration/timing/severity/associated sxs/prior treatment) HPI Comments: Patient presents today with a chief complaint of chest pain.  She reports that the pain is substernal and does not radiate.  She describes the pain as a sharp burning pain.  Pain came on earlier this morning and has been constant since that time.  Pain gradually worsening.  She was given three 81mg  Aspirin and NG by EMS prior to arrival in the ED, but does not feel that it helped.  Pain associated with some shortness of breath.  She reports that she feels as if she is being smothered.  Patient reports that her symptoms are similar to when she had a MI in the past.  She has had some nausea and vomiting over the past few days.  She denies fever or chills.  She denies cough.  She has a history of CAD, CABG, and Coronary stents.  Her Cardiologist is Dr. Antoine Poche.    The history is provided by the patient.    Past Medical History  Diagnosis Date  . Coronary artery disease     status post LAD stenting in 1999 after NSTEMI; CABG 2004; Last LHC 7/11: Proximal LAD 80%, mid 95%, distal 40%, mid R. High 40%, proximal RCA 40%, LIMA-LAD patent, SVG-PDA patent, EF 65%.   . Hypertension   . Hyperlipidemia   . Carotid artery disease     status post right carotid endarterectomy.  . Degenerative joint disease   . Restless leg syndrome   . Mild renal insufficiency   . Heparin induced thrombocytopenia   . Diphtheria     "as a child"  . Pleural effusion, bilateral 06/2003  . Pseudoaneurysm     status post repair following catherization in 1999  . Chronic systolic heart failure 10/02/11  . Ischemic cardiomyopathy     Echocardiogram 10/01/11: Mild LVH, EF 35-40%, basal inferior, basal to mid posterior and basal to mid  anterolateral severe hypokinesis, mild to moderate AI, moderate MR (ischemic MR), moderate LAE, mild RVE, mildly reduced RV systolic function, mild RAE, PASP 43-44  . COPD (chronic obstructive pulmonary disease)   . Atrial fibrillation/flutter     s/p multiple DCCVs;  amiodarone started 09/2011, TEE DCCV 3/13;  amio and coumadin d/c'd after admxn to [facility] 02/2012 with frequent falls/syncope  . Tachycardia-bradycardia syndrome     s/p Medtronic pacemaker implant 09/2011 (8 sec pause noted)  . GERD (gastroesophageal reflux disease)   . Renal artery stenosis     bilateral- status post stenting  . Non-functioning kidney   . Depression     "cries alot"  . Gout     Past Surgical History  Procedure Date  . Carotid endarterectomy 05/2003    right  . Pacemaker insertion 10/02/11    implanted by Dr Johney Frame  . Cholecystectomy ?02/2011  . Tonsillectomy and adenoidectomy   . Renal artery stent 06/2003    bilaterally/e-chart  . Thoracentesis ? 2000; 05/2011  . Coronary angioplasty with stent placement 1999  . Av fistula repair 01/1998    w/pseudoaneurysm repair S/P catheterization/E-chart  . Coronary artery bypass graft 05/2003    CABG X 3  . Tee without cardioversion 10/23/2011    Procedure: TRANSESOPHAGEAL ECHOCARDIOGRAM (TEE);  Surgeon: Wendall Stade, MD;  Location: [facility]  ENDOSCOPY;  Service: Cardiovascular;  Laterality: N/A;  . Cardioversion 10/23/2011    Procedure: CARDIOVERSION;  Surgeon: Wendall Stade, MD;  Location: [facility] ENDOSCOPY;  Service: Cardiovascular;  Laterality: N/A;    Family History  Problem Relation Age of Onset  . Cancer Mother 3    died  . Lung disease Father 46    died  . Heart disease Sister     2 sisters and her son  . Anesthesia problems Neg Hx     History  Substance Use Topics  . Smoking status: Never Smoker   . Smokeless tobacco: Never Used  . Alcohol Use: No    OB History    Grav Para Term Preterm Abortions TAB SAB Ect Mult Living                  Review  of Systems  Constitutional: Negative for fever and chills.  HENT: Negative for neck pain and neck stiffness.   Respiratory: Positive for shortness of breath. Negative for cough, chest tightness and wheezing.   Cardiovascular: Positive for chest pain. Negative for palpitations and leg swelling.  Gastrointestinal: Positive for nausea and vomiting. Negative for abdominal pain and diarrhea.  Skin: Negative for rash.  Neurological: Positive for dizziness and light-headedness. Negative for syncope, facial asymmetry, numbness and headaches.  All other systems reviewed and are negative.    Allergies  Altace; Contrast media; Heparin; and Hydromorphone hcl  Home Medications   Current Outpatient Rx  Name  Route  Sig  Dispense  Refill  . ALPRAZOLAM 0.25 MG PO TABS   Oral   Take 0.25 mg by mouth at bedtime.         . AMIODARONE HCL 100 MG PO TABS   Oral   Take 100 mg by mouth at bedtime.          . ASPIRIN EC 81 MG PO TBEC   Oral   Take 1 tablet (81 mg total) by mouth daily.         Marland Kitchen BISACODYL 5 MG PO TBEC   Oral   Take 5 mg by mouth daily as needed. For constipation         . CARVEDILOL 12.5 MG PO TABS   Oral   Take 1 tablet (12.5 mg total) by mouth 2 (two) times daily with a meal.   120 tablet   3   . VITAMIN B-12 IJ   Injection   Inject 1,000 mcg as directed See admin instructions. Every two weeks.         . FUROSEMIDE 40 MG PO TABS   Oral   Take 40 mg by mouth 2 (two) times daily. May take 1 additional dose if needed for swelling         . HYDROCODONE-ACETAMINOPHEN 10-325 MG PO TABS   Oral   Take 1 tablet by mouth every 4 (four) hours.          Marland Kitchen LEVOTHYROXINE SODIUM 50 MCG PO TABS   Oral   Take 50 mcg by mouth at bedtime.          . LUBIPROSTONE 24 MCG PO CAPS   Oral   Take 24 mcg by mouth 2 (two) times daily with a meal.         . NITROFURANTOIN MACROCRYSTAL 100 MG PO CAPS   Oral   Take 100 mg by mouth 2 (two) times daily. Take for 10 days;  began on 08/21/12         .  NITROGLYCERIN 0.4 MG SL SUBL   Sublingual   Place 0.4 mg under the tongue every 5 (five) minutes as needed. For chest pain         . OMEPRAZOLE 20 MG PO CPDR   Oral   Take 20 mg by mouth 3 (three) times daily.          Marland Kitchen POTASSIUM CHLORIDE CRYS ER 20 MEQ PO TBCR   Oral   Take 20 mEq by mouth daily.         Marland Kitchen ROSUVASTATIN CALCIUM 5 MG PO TABS   Oral   Take 5 mg by mouth every morning.          Marland Kitchen VITAMIN D (ERGOCALCIFEROL) 50000 UNITS PO CAPS   Oral   Take 50,000 Units by mouth every 7 (seven) days. Takes on Wed         . ASPIRIN 81 MG PO CHEW   Oral   Chew 243 mg by mouth once.            BP 156/72  Pulse 72  Temp 98.7 F (37.1 C) (Oral)  Resp 18  SpO2 100%  Physical Exam  Nursing note and vitals reviewed. Constitutional: She appears well-developed and well-nourished.  HENT:  Head: Normocephalic and atraumatic.  Mouth/Throat: Oropharynx is clear and moist.  Neck: Normal range of motion. Neck supple.  Cardiovascular: Normal rate, regular rhythm and intact distal pulses.   Murmur heard. Pulmonary/Chest: Effort normal and breath sounds normal. No respiratory distress. She has no wheezes. She has no rales. She exhibits tenderness.       Tenderness to palpation over the area of the sternum  Abdominal: Soft. She exhibits no distension and no mass. There is no tenderness. There is no rebound and no guarding.  Musculoskeletal: Normal range of motion.       No LE edema  Neurological: She is alert.  Skin: Skin is warm and dry. No rash noted. She is not diaphoretic.  Psychiatric: She has a normal mood and affect.    ED Course  Procedures (including critical care time)  Labs Reviewed  CBC WITH DIFFERENTIAL - Abnormal; Notable for the following:    RBC 3.44 (*)     Hemoglobin 9.6 (*)     HCT 30.5 (*)     RDW 18.1 (*)     All other components within normal limits  BASIC METABOLIC PANEL - Abnormal; Notable for the following:     Sodium 134 (*)     Potassium 3.0 (*)     Glucose, Bld 119 (*)     BUN 33 (*)     Creatinine, Ser 1.86 (*)     GFR calc non Af Amer 24 (*)     GFR calc Af Amer 27 (*)     All other components within normal limits  POCT I-STAT TROPONIN I   Dg Chest 2 View  08/24/2012  *RADIOLOGY REPORT*  Clinical Data: Chest pain.  CHEST - 2 VIEW  Comparison: 08/04/2012  Findings: Previous CABG.  Left subclavian pacemaker stable. Vascular clips in the right mid abdomen.  Stable cardiomegaly.  No effusion.  Prominent perihilar interstitial opacities as before. Old left humeral neck fracture.  IMPRESSION:  1.  Stable cardiomegaly and postop changes.  No acute disease.   Original Report Authenticated By: D. Andria Rhein, MD      No diagnosis found.  11:59 PM Discussed with [facility].  They recommend ordering SL Nitroglycerin.  They report that they  will come see the patient in the ED.   Date: 08/25/2012  Rate: 75  Rhythm: paced  QRS Axis: left  Intervals: paced  ST/T Wave abnormalities: paced  Conduction Disutrbances:paced  Narrative Interpretation:   Old EKG Reviewed: unchanged    MDM  Patient with a history of CABG and Coronary stents presents today with chest pain that has been constant since this morning.  EKG without acute ischemic changes.  CXR negative.  Initial troponin negative.  Due to patient's history of CAD cardiology was consulted.  Cardiology has evaluated the patient and agreed to admit.          Pascal Lux [place], PA-C 08/25/12 1705

## 2012-08-24 NOTE — ED Notes (Signed)
Per EMS - pt c/o intermittent CP X 4 days but today it has been persistent. Pt rates pain at 8/10. Pt c/o SOB, pt O2 sat at 98% on room air. EMS placed on 4L/min [place]. BP 122/80 HR 102 paced. Pt has AV pacemaker. Pt has been a&ox4. EMS placed a 22G in left forearm. EMS administered 1 Nitro and 3 baby ASA, pt reports she took one prior to EMS arrival. Pt reports she had some relief with the nitro but then started to experience the same CP again.

## 2012-08-25 ENCOUNTER — Inpatient Hospital Stay (HOSPITAL_COMMUNITY): Payer: Medicare Other

## 2012-08-25 ENCOUNTER — Encounter (HOSPITAL_COMMUNITY): Payer: Self-pay | Admitting: Nurse Practitioner

## 2012-08-25 DIAGNOSIS — N390 Urinary tract infection, site not specified: Secondary | ICD-10-CM

## 2012-08-25 DIAGNOSIS — R339 Retention of urine, unspecified: Secondary | ICD-10-CM

## 2012-08-25 DIAGNOSIS — K5900 Constipation, unspecified: Secondary | ICD-10-CM

## 2012-08-25 DIAGNOSIS — R079 Chest pain, unspecified: Secondary | ICD-10-CM

## 2012-08-25 DIAGNOSIS — I5023 Acute on chronic systolic (congestive) heart failure: Principal | ICD-10-CM

## 2012-08-25 LAB — LIPASE, BLOOD: Lipase: 26 U/L (ref 11–59)

## 2012-08-25 LAB — HEPATIC FUNCTION PANEL
Albumin: 3 g/dL — ABNORMAL LOW (ref 3.5–5.2)
Alkaline Phosphatase: 16 U/L — ABNORMAL LOW (ref 39–117)
Indirect Bilirubin: 0.2 mg/dL — ABNORMAL LOW (ref 0.3–0.9)
Total Protein: 6.4 g/dL (ref 6.0–8.3)

## 2012-08-25 LAB — URINE MICROSCOPIC-ADD ON

## 2012-08-25 LAB — BASIC METABOLIC PANEL
BUN: 33 mg/dL — ABNORMAL HIGH (ref 6–23)
CO2: 26 mEq/L (ref 19–32)
Chloride: 98 mEq/L (ref 96–112)
Glucose, Bld: 114 mg/dL — ABNORMAL HIGH (ref 70–99)
Potassium: 3.8 mEq/L (ref 3.5–5.1)

## 2012-08-25 LAB — CBC
HCT: 26.4 % — ABNORMAL LOW (ref 36.0–46.0)
Hemoglobin: 8.3 g/dL — ABNORMAL LOW (ref 12.0–15.0)
WBC: 5.1 10*3/uL (ref 4.0–10.5)

## 2012-08-25 LAB — LIPID PANEL
HDL: 27 mg/dL — ABNORMAL LOW (ref >39)
LDL Cholesterol: 43 mg/dL (ref 0–99)
Total CHOL/HDL Ratio: 3.1 RATIO

## 2012-08-25 LAB — URINALYSIS, ROUTINE W REFLEX MICROSCOPIC
Glucose, UA: NEGATIVE mg/dL
Nitrite: NEGATIVE
Protein, ur: NEGATIVE mg/dL

## 2012-08-25 LAB — AMYLASE: Amylase: 48 U/L (ref 0–105)

## 2012-08-25 LAB — TROPONIN I: Troponin I: <0.3 ng/mL (ref <0.30)

## 2012-08-25 LAB — TSH: TSH: 4.935 u[IU]/mL — ABNORMAL HIGH (ref 0.350–4.500)

## 2012-08-25 LAB — HEMOGLOBIN A1C: Mean Plasma Glucose: 128 mg/dL — ABNORMAL HIGH (ref <117)

## 2012-08-25 MED ORDER — ASPIRIN EC 81 MG PO TBEC
81.0000 mg | DELAYED_RELEASE_TABLET | Freq: Every day | ORAL | Status: DC
  Administered 2012-08-26 – 2012-08-29 (×4): 81 mg via ORAL
  Filled 2012-08-25 (×4): qty 1

## 2012-08-25 MED ORDER — ASPIRIN 81 MG PO CHEW: 243.0000 mg | CHEWABLE_TABLET | Freq: Once | ORAL | Status: DC

## 2012-08-25 MED ORDER — FUROSEMIDE 10 MG/ML IJ SOLN
20.0000 mg | Freq: Two times a day (BID) | INTRAMUSCULAR | Status: DC
  Administered 2012-08-25 – 2012-08-26 (×2): 20 mg via INTRAVENOUS
  Filled 2012-08-25 (×2): qty 2

## 2012-08-25 MED ORDER — BISACODYL 5 MG PO TBEC
5.0000 mg | DELAYED_RELEASE_TABLET | Freq: Every day | ORAL | Status: DC | PRN
  Administered 2012-08-27: 5 mg via ORAL
  Filled 2012-08-25: qty 1

## 2012-08-25 MED ORDER — NITROFURANTOIN MACROCRYSTAL 100 MG PO CAPS: 100.0000 mg | ORAL_CAPSULE | Freq: Two times a day (BID) | ORAL | Status: DC

## 2012-08-25 MED ORDER — ISOSORBIDE MONONITRATE ER 30 MG PO TB24
30.0000 mg | ORAL_TABLET | Freq: Every day | ORAL | Status: DC
  Administered 2012-08-25 – 2012-08-29 (×5): 30 mg via ORAL
  Filled 2012-08-25 (×5): qty 1

## 2012-08-25 MED ORDER — PANTOPRAZOLE SODIUM 40 MG PO TBEC
40.0000 mg | DELAYED_RELEASE_TABLET | Freq: Every day | ORAL | Status: DC
  Administered 2012-08-25 – 2012-08-29 (×5): 40 mg via ORAL
  Filled 2012-08-25 (×5): qty 1

## 2012-08-25 MED ORDER — HYDROCODONE-ACETAMINOPHEN 10-325 MG PO TABS
1.0000 | ORAL_TABLET | ORAL | Status: DC
  Administered 2012-08-25 – 2012-08-29 (×17): 1 via ORAL
  Filled 2012-08-25 (×17): qty 1

## 2012-08-25 MED ORDER — POTASSIUM CHLORIDE CRYS ER 20 MEQ PO TBCR
40.0000 meq | EXTENDED_RELEASE_TABLET | Freq: Two times a day (BID) | ORAL | Status: DC
  Administered 2012-08-25 – 2012-08-26 (×4): 40 meq via ORAL
  Filled 2012-08-25 (×5): qty 2

## 2012-08-25 MED ORDER — FLEET ENEMA 7-19 GM/118ML RE ENEM
1.0000 | ENEMA | Freq: Every day | RECTAL | Status: DC | PRN
  Filled 2012-08-25: qty 1

## 2012-08-25 MED ORDER — CARVEDILOL 12.5 MG PO TABS
12.5000 mg | ORAL_TABLET | Freq: Two times a day (BID) | ORAL | Status: DC
  Administered 2012-08-25 – 2012-08-29 (×10): 12.5 mg via ORAL
  Filled 2012-08-25 (×11): qty 1

## 2012-08-25 MED ORDER — AMIODARONE HCL 100 MG PO TABS
100.0000 mg | ORAL_TABLET | Freq: Every day | ORAL | Status: DC
  Administered 2012-08-25 – 2012-08-28 (×4): 100 mg via ORAL
  Filled 2012-08-25 (×6): qty 1

## 2012-08-25 MED ORDER — POLYETHYLENE GLYCOL 3350 17 G PO PACK
17.0000 g | PACK | Freq: Every day | ORAL | Status: DC
  Administered 2012-08-25 – 2012-08-26 (×2): 17 g via ORAL
  Filled 2012-08-25 (×2): qty 1

## 2012-08-25 MED ORDER — ASPIRIN 300 MG RE SUPP: 300.0000 mg | RECTAL | Status: AC

## 2012-08-25 MED ORDER — ALPRAZOLAM 0.25 MG PO TABS
0.2500 mg | ORAL_TABLET | Freq: Every day | ORAL | Status: DC
  Administered 2012-08-25 – 2012-08-28 (×5): 0.25 mg via ORAL
  Filled 2012-08-25 (×5): qty 1

## 2012-08-25 MED ORDER — ATORVASTATIN CALCIUM 10 MG PO TABS
10.0000 mg | ORAL_TABLET | Freq: Every day | ORAL | Status: DC
  Administered 2012-08-25 – 2012-08-29 (×5): 10 mg via ORAL
  Filled 2012-08-25 (×5): qty 1

## 2012-08-25 MED ORDER — FUROSEMIDE 40 MG PO TABS
40.0000 mg | ORAL_TABLET | Freq: Two times a day (BID) | ORAL | Status: DC
  Administered 2012-08-25 (×2): 40 mg via ORAL
  Filled 2012-08-25 (×3): qty 1

## 2012-08-25 MED ORDER — PIPERACILLIN-TAZOBACTAM IN DEX 2-0.25 GM/50ML IV SOLN
2.2500 g | Freq: Four times a day (QID) | INTRAVENOUS | Status: DC
  Administered 2012-08-25 – 2012-08-26 (×5): 2.25 g via INTRAVENOUS
  Filled 2012-08-25 (×7): qty 50

## 2012-08-25 MED ORDER — ISOSORBIDE MONONITRATE ER 30 MG PO TB24: 30.0000 mg | ORAL_TABLET | Freq: Every day | ORAL | Status: DC

## 2012-08-25 MED ORDER — MORPHINE SULFATE 2 MG/ML IJ SOLN
2.0000 mg | INTRAMUSCULAR | Status: DC | PRN
  Administered 2012-08-25 (×2): 2 mg via INTRAVENOUS
  Filled 2012-08-25 (×2): qty 1

## 2012-08-25 MED ORDER — LEVOTHYROXINE SODIUM 50 MCG PO TABS
50.0000 ug | ORAL_TABLET | Freq: Every day | ORAL | Status: DC
  Administered 2012-08-25 – 2012-08-28 (×4): 50 ug via ORAL
  Filled 2012-08-25 (×6): qty 1

## 2012-08-25 MED ORDER — NITROGLYCERIN 0.4 MG SL SUBL: 0.4000 mg | SUBLINGUAL_TABLET | SUBLINGUAL | Status: DC | PRN

## 2012-08-25 MED ORDER — MORPHINE BOLUS VIA INFUSION: 2.0000 mg | INTRAVENOUS | Status: DC | PRN

## 2012-08-25 MED ORDER — ACETAMINOPHEN 325 MG PO TABS: 650.0000 mg | ORAL_TABLET | ORAL | Status: DC | PRN

## 2012-08-25 MED ORDER — ONDANSETRON HCL 4 MG/2ML IJ SOLN
4.0000 mg | Freq: Four times a day (QID) | INTRAMUSCULAR | Status: DC | PRN
  Administered 2012-08-25: 4 mg via INTRAVENOUS
  Filled 2012-08-25: qty 2

## 2012-08-25 MED ORDER — FUROSEMIDE 10 MG/ML IJ SOLN
INTRAMUSCULAR | Status: AC
  Filled 2012-08-25: qty 4

## 2012-08-25 MED ORDER — ASPIRIN 81 MG PO CHEW
324.0000 mg | CHEWABLE_TABLET | ORAL | Status: AC
  Filled 2012-08-25: qty 1

## 2012-08-25 NOTE — Consult Note (Signed)
ANTIBIOTIC CONSULT NOTE - INITIAL  Pharmacy Consult for Zosyn Indication: UTI  Allergies  Allergen Reactions  . Altace (Ramipril) Hives  . Contrast Media (Iodinated Diagnostic Agents) Other (See Comments)    Reaction noted by md-unknown reaction per family  . Heparin Other (See Comments)    Clots blood instead of thinning  . Hydromorphone Hcl Other (See Comments)    Crazy feeling    Patient Measurements: Height: 5\' 4"  (162.6 cm) Weight: 113 lb 5.1 oz (51.4 kg) IBW/kg (Calculated) : 54.7   Vital Signs: Temp: 98 F (36.7 C) (01/13 0538) Temp src: Oral (01/13 0538) BP: 144/78 mmHg (01/13 0538) Pulse Rate: 79  (01/13 0538) Intake/Output from previous day: 01/12 0701 - 01/13 0700 In: 240 [P.O.:240] Out: -  Intake/Output from this shift: Total I/O In: 240 [P.O.:240] Out: -   Labs:  Basename 08/25/12 0620 08/24/12 2216  WBC 5.1 7.5  HGB 8.3* 9.6*  PLT 177 211  LABCREA -- --  CREATININE 1.80* 1.86*   Estimated Creatinine Clearance: 18.5 ml/min (by C-G formula based on Cr of 1.8).  Microbiology: Recent Results (from the past 720 hour(s))  URINE CULTURE     Status: Normal   Collection Time   08/04/12  3:36 AM      Component Value Range Status Comment   Specimen Description URINE, CLEAN CATCH   Final    Special Requests CX ADDED AT 0429 ON [id number]   Final    Culture  Setup Time 08/04/2012 04:43   Final    Colony Count >=100,000 COLONIES/ML   Final    Culture     Final    Value: ESCHERICHIA COLI     Note: Confirmed Extended Spectrum Beta-Lactamase Producer (ESBL)   Report Status 08/06/2012 FINAL   Final    Organism ID, Bacteria ESCHERICHIA COLI   Final     Medical History: Past Medical History  Diagnosis Date  . Coronary artery disease     status post LAD stenting in 1999 after NSTEMI; CABG 2004; Last LHC 7/11: Proximal LAD 80%, mid 95%, distal 40%, mid R. High 40%, proximal RCA 40%, LIMA-LAD patent, SVG-PDA patent, EF 65%.   . Hypertension   . Hyperlipidemia    . Carotid artery disease     status post right carotid endarterectomy.  . Degenerative joint disease   . Restless leg syndrome   . Mild renal insufficiency   . Heparin induced thrombocytopenia   . Diphtheria     "as a child"  . Pleural effusion, bilateral 06/2003  . Pseudoaneurysm     status post repair following catherization in 1999  . Chronic systolic heart failure 10/02/11  . Ischemic cardiomyopathy     Echocardiogram 10/01/11: Mild LVH, EF 35-40%, basal inferior, basal to mid posterior and basal to mid anterolateral severe hypokinesis, mild to moderate AI, moderate MR (ischemic MR), moderate LAE, mild RVE, mildly reduced RV systolic function, mild RAE, PASP 43-44  . COPD (chronic obstructive pulmonary disease)   . Atrial fibrillation/flutter     s/p multiple DCCVs;  amiodarone started 09/2011, TEE DCCV 3/13;  amio and coumadin d/c'd after admxn to [facility] 02/2012 with frequent falls/syncope  . Tachycardia-bradycardia syndrome     s/p Medtronic pacemaker implant 09/2011 (8 sec pause noted)  . GERD (gastroesophageal reflux disease)   . Renal artery stenosis     bilateral- status post stenting  . Non-functioning kidney   . Depression     "cries alot"  . Gout   . Hypothyroidism   .  CHF (congestive heart failure)    Assessment: 87yof started on antibiotics 3 days ago by her PCP for a UTI. Initially Cipro then changed to Nitrofurantoin as patient couldn't tolerate Cipro. Now admitted to the hospital with CP and to continue therapy for presumed UTI. Repeat UA is pending. History of CKD noted with CrCl 59ml/min.  Goal of Therapy:  Appropriate Zosyn dosing  Plan:  1) Zosyn 2.25g IV q6h 2) Follow renal function, cultures, LOT  Fredrik Rigger 08/25/2012,9:57 AM

## 2012-08-25 NOTE — Progress Notes (Signed)
SUBJECTIVE:  [name] [name] is describing upper epigastric pain that is somewhat reproducible with palpation.  She reports that she has been unable to urinate.  She has discomfort at there bladder.  She also reports N/V and anorexia.  She is not describing substernal chest pain although her breathing she thinks is somewhat "short".    PHYSICAL EXAM Filed Vitals:   08/25/12 0145 08/25/12 0255 08/25/12 0349 08/25/12 0538  BP: 126/63 134/74 154/84 144/78  Pulse: 70 73 69 79  Temp:  97.8 F (36.6 C)  98 F (36.7 C)  TempSrc:  Oral  Oral  Resp: 16 20  20   Height:  5\' 4"  (1.626 m)    Weight:  113 lb 15.7 oz (51.7 kg)  113 lb 5.1 oz (51.4 kg)  SpO2: 98% 96%  96%   General:  No acute distress but frail. Lungs:  Decreased breath sounds with bilateral basilar crackles.  Heart:  RRR Abdomen:  Positive bowel sounds, no rebound no guarding. Extremities:  No edema  LABS: Lab Results  Component Value Date   CKTOTAL 22 02/19/2012   CKMB 2.0 02/19/2012   TROPONINI <0.30 08/25/2012   Results for orders placed during the hospital encounter of 08/24/12 (from the past 24 hour(s))  CBC WITH DIFFERENTIAL     Status: Abnormal   Collection Time   08/24/12 10:16 PM      Component Value Range   WBC 7.5  4.0 - 10.5 K/uL   RBC 3.44 (*) 3.87 - 5.11 MIL/uL   Hemoglobin 9.6 (*) 12.0 - 15.0 g/dL   HCT 91.4 (*) 78.2 - 95.6 %   MCV 88.7  78.0 - 100.0 fL   MCH 27.9  26.0 - 34.0 pg   MCHC 31.5  30.0 - 36.0 g/dL   RDW 21.3 (*) 08.6 - 57.8 %   Platelets 211  150 - 400 K/uL   Neutrophils Relative 76  43 - 77 %   Neutro Abs 5.8  1.7 - 7.7 K/uL   Lymphocytes Relative 13  12 - 46 %   Lymphs Abs 1.0  0.7 - 4.0 K/uL   Monocytes Relative 9  3 - 12 %   Monocytes Absolute 0.7  0.1 - 1.0 K/uL   Eosinophils Relative 2  0 - 5 %   Eosinophils Absolute 0.1  0.0 - 0.7 K/uL   Basophils Relative 0  0 - 1 %   Basophils Absolute 0.0  0.0 - 0.1 K/uL  BASIC METABOLIC PANEL     Status: Abnormal   Collection Time   08/24/12 10:16  PM      Component Value Range   Sodium 134 (*) 135 - 145 mEq/L   Potassium 3.0 (*) 3.5 - 5.1 mEq/L   Chloride 96  96 - 112 mEq/L   CO2 26  19 - 32 mEq/L   Glucose, Bld 119 (*) 70 - 99 mg/dL   BUN 33 (*) 6 - 23 mg/dL   Creatinine, Ser 4.69 (*) 0.50 - 1.10 mg/dL   Calcium 9.2  8.4 - 62.9 mg/dL   GFR calc non Af Amer 24 (*) >90 mL/min   GFR calc Af Amer 27 (*) >90 mL/min  POCT I-STAT TROPONIN I     Status: Normal   Collection Time   08/24/12 10:50 PM      Component Value Range   Troponin i, poc 0.01  0.00 - 0.08 ng/mL   Comment 3           TROPONIN  I     Status: Normal   Collection Time   08/25/12  1:11 AM      Component Value Range   Troponin I <0.30  <0.30 ng/mL  MAGNESIUM     Status: Normal   Collection Time   08/25/12  1:11 AM      Component Value Range   Magnesium 2.0  1.5 - 2.5 mg/dL  PRO B NATRIURETIC PEPTIDE     Status: Abnormal   Collection Time   08/25/12  1:11 AM      Component Value Range   Pro B Natriuretic peptide (BNP) 34554.0 (*) 0 - 450 pg/mL  TROPONIN I     Status: Normal   Collection Time   08/25/12  6:20 AM      Component Value Range   Troponin I <0.30  <0.30 ng/mL  CBC     Status: Abnormal   Collection Time   08/25/12  6:20 AM      Component Value Range   WBC 5.1  4.0 - 10.5 K/uL   RBC 2.95 (*) 3.87 - 5.11 MIL/uL   Hemoglobin 8.3 (*) 12.0 - 15.0 g/dL   HCT 78.2 (*) 95.6 - 21.3 %   MCV 89.5  78.0 - 100.0 fL   MCH 28.1  26.0 - 34.0 pg   MCHC 31.4  30.0 - 36.0 g/dL   RDW 08.6 (*) 57.8 - 46.9 %   Platelets 177  150 - 400 K/uL  BASIC METABOLIC PANEL     Status: Abnormal   Collection Time   08/25/12  6:20 AM      Component Value Range   Sodium 134 (*) 135 - 145 mEq/L   Potassium 3.8  3.5 - 5.1 mEq/L   Chloride 98  96 - 112 mEq/L   CO2 26  19 - 32 mEq/L   Glucose, Bld 114 (*) 70 - 99 mg/dL   BUN 33 (*) 6 - 23 mg/dL   Creatinine, Ser 6.29 (*) 0.50 - 1.10 mg/dL   Calcium 8.8  8.4 - 52.8 mg/dL   GFR calc non Af Amer 25 (*) >90 mL/min   GFR calc Af Amer  28 (*) >90 mL/min  LIPID PANEL     Status: Abnormal   Collection Time   08/25/12  6:20 AM      Component Value Range   Cholesterol 85  0 - 200 mg/dL   Triglycerides 73  <413 mg/dL   HDL 27 (*) >24 mg/dL   Total CHOL/HDL Ratio 3.1     VLDL 15  0 - 40 mg/dL   LDL Cholesterol 43  0 - 99 mg/dL    Intake/Output Summary (Last 24 hours) at 08/25/12 0842 Last data filed at 08/25/12 0814  Gross per 24 hour  Intake    480 ml  Output      0 ml  Net    480 ml     ASSESSMENT AND PLAN:  1. Chest Pain:  This is non anginal.  She has some epigastric discomfort.  She has anorexia and nausea and she might need a GI work up while she is here.  I do not suspect an acute coronary syndrome.  2. Ischemic Cardiomyopathy, LVEF 35-40% by echo 10/01/2011, NYHA class 4 :  [place] [address].  She does have some evidence of acute on chronic systolic and diastolic HF.  I will continue with PO Lasix for now.  However, she will likely need increased diuresis after we have  addressed her urinary issues.    3. CAD s/p CABG in 2004 (grafts patent by cath 02/2010) :  As above  4. Hypokalemia :  Potassium is now normal  5. Chronic Kidney Disease (stage 4):  Creat is at baseline.  We will of course watch this closely.  6. Anemia:  No active sign of bleeding. I will check stool guaiac and follow up with a CBC in the AM.   7. UTI:  I have spoken with the Hospital Service and I have asked them to help with this.  She had resistant Ecoli previously and cannot urinate now.  She will likely need a Foley.  I am holding off on IV Lasix this am although she will likely need this later today.    [name] [facility] 08/25/2012 8:42 AM

## 2012-08-25 NOTE — Progress Notes (Signed)
   S: Came by to f/u on patient.  Appreciate IM input.  Since foley placement, pt has had some improvement in lower abdominal discomfort.  That said, she continues to experience nausea and vomiting (small volume) after meals.  Output via foley has been very good with approximately 400 ml in the bag now. O:   Filed Vitals:   08/25/12 1354  BP: 122/57  Pulse: 69  Temp: 98.1 F (36.7 C)  Resp: 18   Pleasant, nad, aaox3.  Neck - jvp ~ 12cm, Lungs - somewhat diminished but clear overall, Cor - RRR, Abd - diffusely tender, + bs, Ext - no cce. A/P: 1.  Epigastric/Abdominal pain and tenderness with nausea and vomiting:  This has been ongoing since Thursday.  She is diffusely tender on exam.  Ss slightly better following foley placement.  Will obtain KUB. Check amylase/lipase/lft's. Cont zofran prn.  2. Acute on chronic diastolic chf:  JVP mod elevated.  Lungs relatively clear.  Now on low dose IV lasix.  Good output in foley.  F/u I/O, weights, creat in AM.  Nicolasa Ducking, NP

## 2012-08-25 NOTE — ED Notes (Signed)
Family leaving.  Left contact information.  [phone].  [phone]

## 2012-08-25 NOTE — ED Notes (Signed)
Attempted to call report.  Advised nurse was busy and would call back

## 2012-08-25 NOTE — Progress Notes (Signed)
Pt. Complaining of 8/10 chest pain.  Pt states pain is squeezing and feels like she is smothering.  BP 154/84.  EKG showed AV paced.  Pt. Given 1 nitro SL.  Pain down to 6/10.  Pt. Declines anymore nitro SL for pain.  Dr. Katha Cabal paged and made aware.   Morphine ordered and given.  Will continue to monitor patient.

## 2012-08-25 NOTE — Progress Notes (Signed)
Foley catheter placed per MD order for urinary retention; will continue to monitor patient. Lorretta Harp RN

## 2012-08-25 NOTE — H&P (Signed)
[name] [name] is an 79 y.o. female.    Primary Cardiologist: Dr. Juanita Craver  Chief Complaint: Chest pain  HPI: 79 y/o female with a PMH of CAD presenting for chest pain evaluation.  She has a PMH of CAD s/p CABG 2004.  Her last cardiac cath 02/2010 showed 80% prox-LAD/95% mid-LAD, 40% prox-RCA stenosis;  grafts showed patent LIMA-->LAD and patent SVG-->PDA. She also has ischemic cardiomyopathy with LVEF of 35-40% by transthoracic echocardiogram from 10/01/2011 and CKD (stage 4).  About 3 days prior to presentation, she was seen by her PCP for a reported UTI which was originally treated with Cipro and subsequently changed to Nitrofurantoin since she could not tolerate Cipro.  On the day of presentation, she started to complain of chest pain which she describes as a heaviness (feels like something is sitting on her chest).  The chest pain was associated with shortness of breath, nausea/vomiting, but she denies radiating, diaphoresis, PND, orthopnea, palpitation or syncope.  She normally has a sedentary lifestyle, but she has in bed all day long as she is unable to get up and walk around the house as she normally does.  Her ECG showed atrial-ventricular paced rhythm with heart rate 75 bpm.  Her initial set of cardiac markers are negative.   Past Medical History  Diagnosis Date  . Coronary artery disease     status post LAD stenting in 1999 after NSTEMI; CABG 2004; Last LHC 7/11: Proximal LAD 80%, mid 95%, distal 40%, mid R. High 40%, proximal RCA 40%, LIMA-LAD patent, SVG-PDA patent, EF 65%.   . Hypertension   . Hyperlipidemia   . Carotid artery disease     status post right carotid endarterectomy.  . Degenerative joint disease   . Restless leg syndrome   . Mild renal insufficiency   . Heparin induced thrombocytopenia   . Diphtheria     "as a child"  . Pleural effusion, bilateral 06/2003  . Pseudoaneurysm     status post repair following catherization in 1999  . Chronic systolic heart  failure 10/02/11  . Ischemic cardiomyopathy     Echocardiogram 10/01/11: Mild LVH, EF 35-40%, basal inferior, basal to mid posterior and basal to mid anterolateral severe hypokinesis, mild to moderate AI, moderate MR (ischemic MR), moderate LAE, mild RVE, mildly reduced RV systolic function, mild RAE, PASP 43-44  . COPD (chronic obstructive pulmonary disease)   . Atrial fibrillation/flutter     s/p multiple DCCVs;  amiodarone started 09/2011, TEE DCCV 3/13;  amio and coumadin d/c'd after admxn to [facility] 02/2012 with frequent falls/syncope  . Tachycardia-bradycardia syndrome     s/p Medtronic pacemaker implant 09/2011 (8 sec pause noted)  . GERD (gastroesophageal reflux disease)   . Renal artery stenosis     bilateral- status post stenting  . Non-functioning kidney   . Depression     "cries alot"  . Gout     Past Surgical History  Procedure Date  . Carotid endarterectomy 05/2003    right  . Pacemaker insertion 10/02/11    implanted by Dr Johney Frame  . Cholecystectomy ?02/2011  . Tonsillectomy and adenoidectomy   . Renal artery stent 06/2003    bilaterally/e-chart  . Thoracentesis ? 2000; 05/2011  . Coronary angioplasty with stent placement 1999  . Av fistula repair 01/1998    w/pseudoaneurysm repair S/P catheterization/E-chart  . Coronary artery bypass graft 05/2003    CABG X 3  . Tee without cardioversion 10/23/2011    Procedure: TRANSESOPHAGEAL ECHOCARDIOGRAM (TEE);  Surgeon: Wendall Stade, MD;  Location: [facility] ENDOSCOPY;  Service: Cardiovascular;  Laterality: N/A;  . Cardioversion 10/23/2011    Procedure: CARDIOVERSION;  Surgeon: Wendall Stade, MD;  Location: [facility] ENDOSCOPY;  Service: Cardiovascular;  Laterality: N/A;    Family History  Problem Relation Age of Onset  . Cancer Mother 9    died  . Lung disease Father 69    died  . Heart disease Sister     2 sisters and her son  . Anesthesia problems Neg Hx    Social History:  reports that she has never smoked. She has never used  smokeless tobacco. She reports that she does not drink alcohol or use illicit drugs.  Allergies:  Allergies  Allergen Reactions  . Altace (Ramipril) Hives  . Contrast Media (Iodinated Diagnostic Agents) Other (See Comments)    Reaction noted by md-unknown reaction per family  . Heparin Other (See Comments)    Clots blood instead of thinning  . Hydromorphone Hcl Other (See Comments)    Crazy feeling   Cardiac Medication: ASA 81 mg q day Carvedilol 12.5 mg bid Amiodarone 100 mg q day Lasix 40 mg bid KCL 20 mEQ bid  (Not in a hospital admission)  Results for orders placed during the hospital encounter of 08/24/12 (from the past 48 hour(s))  CBC WITH DIFFERENTIAL     Status: Abnormal   Collection Time   08/24/12 10:16 PM      Component Value Range Comment   WBC 7.5  4.0 - 10.5 K/uL    RBC 3.44 (*) 3.87 - 5.11 MIL/uL    Hemoglobin 9.6 (*) 12.0 - 15.0 g/dL    HCT 16.1 (*) 09.6 - 46.0 %    MCV 88.7  78.0 - 100.0 fL    MCH 27.9  26.0 - 34.0 pg    MCHC 31.5  30.0 - 36.0 g/dL    RDW 04.5 (*) 40.9 - 15.5 %    Platelets 211  150 - 400 K/uL    Neutrophils Relative 76  43 - 77 %    Neutro Abs 5.8  1.7 - 7.7 K/uL    Lymphocytes Relative 13  12 - 46 %    Lymphs Abs 1.0  0.7 - 4.0 K/uL    Monocytes Relative 9  3 - 12 %    Monocytes Absolute 0.7  0.1 - 1.0 K/uL    Eosinophils Relative 2  0 - 5 %    Eosinophils Absolute 0.1  0.0 - 0.7 K/uL    Basophils Relative 0  0 - 1 %    Basophils Absolute 0.0  0.0 - 0.1 K/uL   BASIC METABOLIC PANEL     Status: Abnormal   Collection Time   08/24/12 10:16 PM      Component Value Range Comment   Sodium 134 (*) 135 - 145 mEq/L    Potassium 3.0 (*) 3.5 - 5.1 mEq/L    Chloride 96  96 - 112 mEq/L    CO2 26  19 - 32 mEq/L    Glucose, Bld 119 (*) 70 - 99 mg/dL    BUN 33 (*) 6 - 23 mg/dL    Creatinine, Ser 8.11 (*) 0.50 - 1.10 mg/dL    Calcium 9.2  8.4 - 91.4 mg/dL    GFR calc non Af Amer 24 (*) >90 mL/min    GFR calc Af Amer 27 (*) >90 mL/min     POCT I-STAT TROPONIN I     Status:  Normal   Collection Time   08/24/12 10:50 PM      Component Value Range Comment   Troponin i, poc 0.01  0.00 - 0.08 ng/mL    Comment 3             Dg Chest 2 View  08/24/2012  *RADIOLOGY REPORT*  Clinical Data: Chest pain.  CHEST - 2 VIEW  Comparison: 08/04/2012  Findings: Previous CABG.  Left subclavian pacemaker stable. Vascular clips in the right mid abdomen.  Stable cardiomegaly.  No effusion.  Prominent perihilar interstitial opacities as before. Old left humeral neck fracture.  IMPRESSION:  1.  Stable cardiomegaly and postop changes.  No acute disease.   Original Report Authenticated By: D. Andria Rhein, MD     Review of Systems  Constitutional: Positive for malaise/fatigue. Negative for fever, chills, weight loss and diaphoresis.  HENT: Negative for hearing loss, ear pain, nosebleeds, congestion, sore throat, neck pain, tinnitus and ear discharge.   Eyes: Negative for blurred vision, double vision, photophobia, pain, discharge and redness.  Respiratory: Positive for shortness of breath. Negative for cough, hemoptysis, sputum production, wheezing and stridor.   Cardiovascular: Positive for chest pain. Negative for palpitations, orthopnea, claudication, leg swelling and PND.  Gastrointestinal: Positive for nausea and vomiting. Negative for heartburn, abdominal pain, diarrhea, constipation, blood in stool and melena.  Genitourinary: Positive for dysuria. Negative for urgency, frequency, hematuria and flank pain.  Musculoskeletal: Negative for myalgias, back pain, joint pain and falls.  Skin: Negative for itching and rash.  Neurological: Negative for dizziness, tingling, tremors, sensory change, speech change, focal weakness, seizures, loss of consciousness, weakness and headaches.  Psychiatric/Behavioral: Negative for hallucinations.    Blood pressure 156/72, pulse 72, temperature 98.7 F (37.1 C), temperature source Oral, resp. rate 18, SpO2  100.00%. Physical Exam  Constitutional: She appears well-developed and well-nourished. No distress.  HENT:  Head: Normocephalic.  Eyes: EOM are normal. Right eye exhibits no discharge. Left eye exhibits no discharge.  Neck: Normal range of motion. Neck supple. JVD present. No tracheal deviation present. No thyromegaly present.  Cardiovascular: Normal rate.  Exam reveals friction rub. Exam reveals no gallop.   Murmur heard.      3/6 holosystolic murmur at apex radiating to axilla  Respiratory: Effort normal and breath sounds normal. No stridor. No respiratory distress. She has no wheezes. She has no rales. She exhibits no tenderness.  GI: She exhibits no distension. There is no tenderness. There is no rebound and no guarding.  Musculoskeletal: She exhibits no edema and no tenderness.  Neurological: She is alert.  Skin: She is not diaphoretic.  Psychiatric: She has a normal mood and affect.     Assessment/Plan  1. Chest Pain 2. Ischemic Cardiomyopathy, LVEF 35-40% by echo 10/01/2011, NYHA class 4 3. CAD s/p CABG in 2004 (grafts patent by cath 02/2010) 4. Hypokalemia 5. Chronic Kidney Disease (stage 4)  I will admit the patient to cardiology and observe her on telemetry.  I will obtain serial cardiac markers to rule out MI and start her on Imdur 30 mg q daily in addition to her Aspirin, beta-blockers and statins.  I will replace her potassium by oral supplementation. Given her co-morbidities and CKD (stage 4), I will opt for medical therapy with long acting nitrates as started above.   Bricen Victory E 08/25/2012, 1:02 AM

## 2012-08-25 NOTE — Consult Note (Signed)
[facility] Medical Consultation  [name] [name] ZOX:[id number] DOB: Apr 22, 1925 DOA: 08/24/2012 PCP: Pamelia Hoit, MD   Requesting physician: Dr. La Grulla Lions Date of consultation: 08/25/12 Reason for consultation: UTI/urinary retntion  Impression/Recommendations Principal Problem:  *UTI (lower urinary tract infection) Active Problems:  CKD (chronic kidney disease), stage IV  Constipation  Urinary retention    1. UTI- U/A done 12/28- treated with cipro/nitrofurantin- culture shows resistance to cipro- will start zosyn and order new u/a and culture 2. Urinary retention- multifactorial: chronic pain meds, constipation issues, and UTI-  Will place foley for now so lasix can be given with hope of d/cing foley in next few days- would hope as UTI treated and constipation addresed, that urinary retention would get better- if not may benefit from urology consultation.  Strict I/Os 3. Constipation- ducolax/fleets enema PRN- mobilize patient  4. CKD IV- stable 5. Fluid overload- defer to primary 6. Weakness- PT eval 7. Anemia- ? If patient would benefit from a transfusion to get Hgb to baseline of around 10- would suspect Hgb will go up some with diuresis 8. Chest pain- appears to be non cardiac  I will followup again tomorrow. Please contact me if I can be of assistance in the meanwhile. Thank you for this consultation.  Chief Complaint: weakness  HPI:  79 yo female who comes in with multiple complaints.  Per family, main complaint is weakness which has been on ongoing issue since release from her SN[facility] stay.    She had U/A done on 12/23 which grew resistant ecoli to cipro (patient was treated with cipro/nitrofurantin).  She came back to the ER on 1/13 with c/o chest pain.  She was admitted by cardiology  Today she has more of epigastric pain- reproducible with palpation.  She has had trouble urinating- nursing reports she has urinated 2 x here.  Once for about with a bladder  scan done showing 400 mls and then she urinated again for 250 mls.  She was SOB sitting in her bed, mild increase in her work of breathing.    Patient is also very worried about getting her pain medications- says she has been on it for years and family reports delirium when she does not get it.     Review of Systems:  All systems reviewed, negative unless stated above  Past Medical History  Diagnosis Date  . Coronary artery disease     status post LAD stenting in 1999 after NSTEMI; CABG 2004; Last LHC 7/11: Proximal LAD 80%, mid 95%, distal 40%, mid R. High 40%, proximal RCA 40%, LIMA-LAD patent, SVG-PDA patent, EF 65%.   . Hypertension   . Hyperlipidemia   . Carotid artery disease     status post right carotid endarterectomy.  . Degenerative joint disease   . Restless leg syndrome   . Mild renal insufficiency   . Heparin induced thrombocytopenia   . Diphtheria     "as a child"  . Pleural effusion, bilateral 06/2003  . Pseudoaneurysm     status post repair following catherization in 1999  . Chronic systolic heart failure 10/02/11  . Ischemic cardiomyopathy     Echocardiogram 10/01/11: Mild LVH, EF 35-40%, basal inferior, basal to mid posterior and basal to mid anterolateral severe hypokinesis, mild to moderate AI, moderate MR (ischemic MR), moderate LAE, mild RVE, mildly reduced RV systolic function, mild RAE, PASP 43-44  . COPD (chronic obstructive pulmonary disease)   . Atrial fibrillation/flutter     s/p multiple DCCVs;  amiodarone  started 09/2011, TEE DCCV 3/13;  amio and coumadin d/c'd after admxn to [facility] 02/2012 with frequent falls/syncope  . Tachycardia-bradycardia syndrome     s/p Medtronic pacemaker implant 09/2011 (8 sec pause noted)  . GERD (gastroesophageal reflux disease)   . Renal artery stenosis     bilateral- status post stenting  . Non-functioning kidney   . Depression     "cries alot"  . Gout   . Hypothyroidism   . CHF (congestive heart failure)    Past Surgical  History  Procedure Date  . Carotid endarterectomy 05/2003    right  . Pacemaker insertion 10/02/11    implanted by Dr Johney Frame  . Cholecystectomy ?02/2011  . Tonsillectomy and adenoidectomy   . Renal artery stent 06/2003    bilaterally/e-chart  . Thoracentesis ? 2000; 05/2011  . Coronary angioplasty with stent placement 1999  . Av fistula repair 01/1998    w/pseudoaneurysm repair S/P catheterization/E-chart  . Coronary artery bypass graft 05/2003    CABG X 3  . Tee without cardioversion 10/23/2011    Procedure: TRANSESOPHAGEAL ECHOCARDIOGRAM (TEE);  Surgeon: Wendall Stade, MD;  Location: [facility] ENDOSCOPY;  Service: Cardiovascular;  Laterality: N/A;  . Cardioversion 10/23/2011    Procedure: CARDIOVERSION;  Surgeon: Wendall Stade, MD;  Location: [facility] ENDOSCOPY;  Service: Cardiovascular;  Laterality: N/A;   Social History:  reports that she has never smoked. She has never used smokeless tobacco. She reports that she does not drink alcohol or use illicit drugs.  Allergies  Allergen Reactions  . Altace (Ramipril) Hives  . Contrast Media (Iodinated Diagnostic Agents) Other (See Comments)    Reaction noted by md-unknown reaction per family  . Heparin Other (See Comments)    Clots blood instead of thinning  . Hydromorphone Hcl Other (See Comments)    Crazy feeling   Family History  Problem Relation Age of Onset  . Cancer Mother 9    died  . Lung disease Father 43    died  . Heart disease Sister     2 sisters and her son  . Anesthesia problems Neg Hx     Prior to Admission medications   Medication Sig Start Date End Date Taking? Authorizing Provider  ALPRAZolam (XANAX) 0.25 MG tablet Take 0.25 mg by mouth at bedtime. 02/21/12  Yes Erick Blinks, MD  amiodarone (PACERONE) 100 MG tablet Take 100 mg by mouth at bedtime.    Yes Historical Provider, MD  aspirin EC 81 MG tablet Take 1 tablet (81 mg total) by mouth daily. 03/07/12  Yes Beatrice Lecher, PA  bisacodyl (DULCOLAX) 5 MG EC tablet  Take 5 mg by mouth daily as needed. For constipation   Yes Historical Provider, MD  carvedilol (COREG) 12.5 MG tablet Take 1 tablet (12.5 mg total) by mouth 2 (two) times daily with a meal. 01/17/12 01/16/13 Yes Hillis Range, MD  Cyanocobalamin (VITAMIN B-12 IJ) Inject 1,000 mcg as directed See admin instructions. Every two weeks.   Yes Historical Provider, MD  furosemide (LASIX) 40 MG tablet Take 40 mg by mouth 2 (two) times daily. May take 1 additional dose if needed for swelling 06/09/12  Yes Ok Anis, NP  HYDROcodone-acetaminophen (NORCO) 10-325 MG per tablet Take 1 tablet by mouth every 4 (four) hours.    Yes Historical Provider, MD  levothyroxine (SYNTHROID, LEVOTHROID) 50 MCG tablet Take 50 mcg by mouth at bedtime.    Yes Historical Provider, MD  lubiprostone (AMITIZA) 24 MCG capsule Take 24 mcg by  mouth 2 (two) times daily with a meal.   Yes Historical Provider, MD  nitrofurantoin (MACRODANTIN) 100 MG capsule Take 100 mg by mouth 2 (two) times daily. Take for 10 days; began on 08/21/12 08/21/12  Yes Historical Provider, MD  nitroGLYCERIN (NITROSTAT) 0.4 MG SL tablet Place 0.4 mg under the tongue every 5 (five) minutes as needed. For chest pain   Yes Historical Provider, MD  omeprazole (PRILOSEC) 20 MG capsule Take 20 mg by mouth 3 (three) times daily.    Yes Historical Provider, MD  potassium chloride SA (K-DUR,KLOR-CON) 20 MEQ tablet Take 20 mEq by mouth daily. 06/09/12  Yes Ok Anis, NP  rosuvastatin (CRESTOR) 5 MG tablet Take 5 mg by mouth every morning.    Yes Historical Provider, MD  Vitamin D, Ergocalciferol, (DRISDOL) 50000 UNITS CAPS Take 50,000 Units by mouth every 7 (seven) days. Takes on Wed   Yes Historical Provider, MD  aspirin 81 MG chewable tablet Chew 243 mg by mouth once.     Historical Provider, MD   Physical Exam: Blood pressure 124/72, pulse 70, temperature 97.2 F (36.2 C), temperature source Oral, resp. rate 18, height 5\' 4"  (1.626 m), weight 51.4 kg (113  lb 5.1 oz), SpO2 100.00%. Filed Vitals:   08/25/12 0255 08/25/12 0349 08/25/12 0538 08/25/12 1013  BP: 134/74 154/84 144/78 124/72  Pulse: 73 69 79 70  Temp: 97.8 F (36.6 C)  98 F (36.7 C) 97.2 F (36.2 C)  TempSrc: Oral  Oral Oral  Resp: 20  20 18   Height: 5\' 4"  (1.626 m)     Weight: 51.7 kg (113 lb 15.7 oz)  51.4 kg (113 lb 5.1 oz)   SpO2: 96%  96% 100%     General:  Elderly, frail appearing  Eyes: wnl  ENT: wnl  Neck: no JVD  Cardiovascular: rrr  Respiratory: decreased effort, no wheezing, mild crackles  Abdomen: +BS, lower abd tenderness  Skin: no rashes or lesions  Musculoskeletal: moves all 4 extremities  Psychiatric: no SI/no HI- flat affect  Neurologic: CN 2- 12 grossly intact  Labs on Admission:  Basic Metabolic Panel:  Lab 08/25/12 1610 08/25/12 0111 08/24/12 2216  NA 134* -- 134*  K 3.8 -- 3.0*  CL 98 -- 96  CO2 26 -- 26  GLUCOSE 114* -- 119*  BUN 33* -- 33*  CREATININE 1.80* -- 1.86*  CALCIUM 8.8 -- 9.2  MG -- 2.0 --  PHOS -- -- --   Liver Function Tests: No results found for this basename: AST:5,ALT:5,ALKPHOS:5,BILITOT:5,PROT:5,ALBUMIN:5 in the last 168 hours No results found for this basename: LIPASE:5,AMYLASE:5 in the last 168 hours No results found for this basename: AMMONIA:5 in the last 168 hours CBC:  Lab 08/25/12 0620 08/24/12 2216  WBC 5.1 7.5  NEUTROABS -- 5.8  HGB 8.3* 9.6*  HCT 26.4* 30.5*  MCV 89.5 88.7  PLT 177 211   Cardiac Enzymes:  Lab 08/25/12 0620 08/25/12 0111  CKTOTAL -- --  CKMB -- --  CKMBINDEX -- --  TROPONINI <0.30 <0.30   BNP: No components found with this basename: POCBNP:5 CBG: No results found for this basename: GLUCAP:5 in the last 168 hours  Radiological Exams on Admission: Dg Chest 2 View  08/24/2012  *RADIOLOGY REPORT*  Clinical Data: Chest pain.  CHEST - 2 VIEW  Comparison: 08/04/2012  Findings: Previous CABG.  Left subclavian pacemaker stable. Vascular clips in the right mid abdomen.   Stable cardiomegaly.  No effusion.  Prominent perihilar interstitial opacities as  before. Old left humeral neck fracture.  IMPRESSION:  1.  Stable cardiomegaly and postop changes.  No acute disease.   Original Report Authenticated By: D. Andria Rhein, MD       Time spent: 70 min  Marlin Canary [facility] Pager [phone]  If 7PM-7AM, please contact night-coverage [email or web address] Password [facility] 08/25/2012, 12:53 PM

## 2012-08-25 NOTE — ED Notes (Signed)
MD at bedside. Cardiology 

## 2012-08-25 NOTE — Evaluation (Signed)
Physical Therapy Evaluation Patient Details Name: [name] [name] MRN: [id number] DOB: Feb 20, 1925 Today's Date: 08/25/2012 Time: 4098-1191 PT Time Calculation (min): 16 min  PT Assessment / Plan / Recommendation Clinical Impression  79 y/o female admitted for chest pain and UTI. Per cardiology chest pain likely not cardiac related. Presents to PT with below impairments limiting her functional mobility. Will benefit physical therapy in the acute setting to  maximize strength and independence for safe d/c plan.     PT Assessment  Patient needs continued PT services    Follow Up Recommendations  Home health PT;Supervision/Assistance - 24 hour    Does the patient have the potential to tolerate intense rehabilitation      Barriers to Discharge        Equipment Recommendations  Rolling walker with 5" wheels    Recommendations for Other Services     Frequency Min 3X/week    Precautions / Restrictions Precautions Precautions: Fall Restrictions Weight Bearing Restrictions: No   Pertinent Vitals/Pain Pt felt nauseated with burning in her chest, RN made aware      Mobility  Bed Mobility Bed Mobility: Supine to Sit Supine to Sit: 4: Min assist Details for Bed Mobility Assistance: initiates movement but needing minA facilitation at upper trunk for follow through and tall posture due to weakness and fatigue Transfers Transfers: Sit to Stand;Stand to Sit Sit to Stand: With upper extremity assist;4: Min assist;With armrests;From bed;From chair/3-in-1 Stand to Sit: With upper extremity assist;4: Min assist;With armrests;To bed;To chair/3-in-1 Details for Transfer Assistance: min stability assist and cues for hand placement and safety Ambulation/Gait Ambulation/Gait Assistance: 4: Min assist Ambulation Distance (Feet): 16 Feet (8 ft x2) Assistive device: None;1 person hand held assist Ambulation/Gait Assistance Details: for about 8 ft ambulates with flexed trunk reaching for  unsteady environmental supports that are outside her BOS needing min stability assist, ambulated another 8 ft after seated rest break with 1 person HHA with improved posture, likely would benefit from using RW Gait Pattern: Trunk flexed;Narrow base of support;Shuffle;Decreased stride length Stairs: No Wheelchair Mobility Wheelchair Mobility: No              PT Diagnosis: Difficulty walking;Abnormality of gait;Generalized weakness  PT Problem List: Decreased strength;Decreased activity tolerance;Decreased balance;Decreased mobility;Decreased cognition;Decreased knowledge of use of DME PT Treatment Interventions: DME instruction;Gait training;Functional mobility training;Therapeutic activities;Therapeutic exercise;Balance training;Neuromuscular re-education;Patient/family education   PT Goals Acute Rehab PT Goals PT Goal Formulation: With patient Time For Goal Achievement: 09/01/12 Potential to Achieve Goals: Good Pt will go Supine/Side to Sit: with modified independence PT Goal: Supine/Side to Sit - Progress: Goal set today Pt will go Sit to Supine/Side: with modified independence PT Goal: Sit to Supine/Side - Progress: Goal set today Pt will go Sit to Stand: with modified independence PT Goal: Sit to Stand - Progress: Goal set today Pt will go Stand to Sit: with modified independence PT Goal: Stand to Sit - Progress: Goal set today Pt will Transfer Bed to Chair/Chair to Bed: with modified independence PT Transfer Goal: Bed to Chair/Chair to Bed - Progress: Goal set today Pt will Ambulate: 51 - 150 feet;with supervision;with least restrictive assistive device PT Goal: Ambulate - Progress: Goal set today Pt will Perform Home Exercise Program: Independently PT Goal: Perform Home Exercise Program - Progress: Goal set today  Visit Information  Last PT Received On: 08/25/12 Assistance Needed: +1    Subjective Data  Subjective: I feel nauseous.  Patient Stated Goal: home   Prior  Functioning  Home Living Lives With: Daughter Available Help at Discharge: Family;Available 24 hours/day Type of Home: House Home Access: Level entry Home Layout: One level Home Adaptive Equipment: None Prior Function Level of Independence: Independent Able to Take Stairs?: Yes Driving: No Communication Communication: No difficulties    Cognition  Overall Cognitive Status: Impaired Area of Impairment: Memory Arousal/Alertness: Awake/alert Orientation Level: Appears intact for tasks assessed Behavior During Session: [facility] for tasks performed Memory Deficits: repeats herself a lot, wonder if there is some dementia     Extremity/Trunk Assessment Right Upper Extremity Assessment RUE ROM/Strength/Tone: [facility] for tasks assessed Left Upper Extremity Assessment LUE ROM/Strength/Tone: WFL for tasks assessed Right Lower Extremity Assessment RLE ROM/Strength/Tone: Deficits RLE ROM/Strength/Tone Deficits: generalized weakness, grossly 4/5 RLE Sensation: WFL - Light Touch Left Lower Extremity Assessment LLE ROM/Strength/Tone: Deficits LLE ROM/Strength/Tone Deficits: generalized weakness, grossly 4/5 LLE Sensation: WFL - Light Touch   Balance    End of Session PT - End of Session Equipment Utilized During Treatment: Gait belt Activity Tolerance: Patient limited by fatigue;Treatment limited secondary to medical complications (Comment) (pt feeling nauseated) Patient left: in bed;with call bell/phone within reach;with nursing in room Nurse Communication: Mobility status;Other (comment) (pt nauseated and requesting medication)  GP     [facility] HELEN 08/25/2012, 3:32 PM

## 2012-08-26 DIAGNOSIS — I5022 Chronic systolic (congestive) heart failure: Secondary | ICD-10-CM

## 2012-08-26 DIAGNOSIS — I509 Heart failure, unspecified: Secondary | ICD-10-CM

## 2012-08-26 DIAGNOSIS — I2510 Atherosclerotic heart disease of native coronary artery without angina pectoris: Secondary | ICD-10-CM

## 2012-08-26 DIAGNOSIS — N179 Acute kidney failure, unspecified: Secondary | ICD-10-CM

## 2012-08-26 LAB — CBC
HCT: 30.2 % — ABNORMAL LOW (ref 36.0–46.0)
Hemoglobin: 9.5 g/dL — ABNORMAL LOW (ref 12.0–15.0)
MCHC: 31.5 g/dL (ref 30.0–36.0)
WBC: 7.3 10*3/uL (ref 4.0–10.5)

## 2012-08-26 LAB — URINE CULTURE: Colony Count: NO GROWTH

## 2012-08-26 LAB — BASIC METABOLIC PANEL
Chloride: 95 mEq/L — ABNORMAL LOW (ref 96–112)
GFR calc Af Amer: 25 mL/min — ABNORMAL LOW (ref >90)
GFR calc non Af Amer: 22 mL/min — ABNORMAL LOW (ref >90)
Potassium: 5.3 mEq/L — ABNORMAL HIGH (ref 3.5–5.1)
Sodium: 133 mEq/L — ABNORMAL LOW (ref 135–145)

## 2012-08-26 MED ORDER — FUROSEMIDE 10 MG/ML IJ SOLN
INTRAMUSCULAR | Status: AC
  Filled 2012-08-26: qty 4

## 2012-08-26 MED ORDER — FUROSEMIDE 40 MG PO TABS
40.0000 mg | ORAL_TABLET | Freq: Two times a day (BID) | ORAL | Status: DC
  Administered 2012-08-26: 40 mg via ORAL
  Filled 2012-08-26 (×4): qty 1

## 2012-08-26 MED ORDER — SENNA 8.6 MG PO TABS
1.0000 | ORAL_TABLET | Freq: Every day | ORAL | Status: DC
  Administered 2012-08-26 – 2012-08-27 (×2): 8.6 mg via ORAL
  Filled 2012-08-26 (×4): qty 1

## 2012-08-26 MED ORDER — PIPERACILLIN-TAZOBACTAM IN DEX 2-0.25 GM/50ML IV SOLN
2.2500 g | Freq: Four times a day (QID) | INTRAVENOUS | Status: AC
  Administered 2012-08-26 – 2012-08-27 (×5): 2.25 g via INTRAVENOUS
  Filled 2012-08-26 (×5): qty 50

## 2012-08-26 MED ORDER — POLYETHYLENE GLYCOL 3350 17 G PO PACK
17.0000 g | PACK | Freq: Two times a day (BID) | ORAL | Status: DC
  Administered 2012-08-26 – 2012-08-27 (×3): 17 g via ORAL
  Filled 2012-08-26 (×7): qty 1

## 2012-08-26 MED ORDER — TAMSULOSIN HCL 0.4 MG PO CAPS
0.4000 mg | ORAL_CAPSULE | Freq: Every day | ORAL | Status: DC
  Administered 2012-08-26 – 2012-08-29 (×4): 0.4 mg via ORAL
  Filled 2012-08-26 (×4): qty 1

## 2012-08-26 MED ORDER — NITROFURANTOIN MACROCRYSTAL 100 MG PO CAPS: 100.0000 mg | ORAL_CAPSULE | Freq: Two times a day (BID) | ORAL | Status: DC

## 2012-08-26 NOTE — Progress Notes (Signed)
SUBJECTIVE:  She feels better today. She has less suprapubic pain. She's not having any chest pain. She ate a bit yesterday. She ambulated a little bit with physical therapy yesterday.  PHYSICAL EXAM Filed Vitals:   08/25/12 1834 08/25/12 2140 08/26/12 0140 08/26/12 0601  BP: 105/45 116/63 99/55 120/65  Pulse: 71 70 71 70  Temp: 98.8 F (37.1 C) 97.5 F (36.4 C) 98.2 F (36.8 C) 98.2 F (36.8 C)  TempSrc: Oral Oral Oral Oral  Resp: 18 18 16 18   Height:      Weight:    114 lb 8 oz (51.937 kg)  SpO2: 94% 100% 99% 100%   General:  No acute distress but frail. HEENT:  PERRL Lungs:  Decreased breath sounds with bilateral basilar crackles improved. Heart:  RRR Abdomen:  Positive bowel sounds, no rebound no guarding. Extremities:  No edema Neuro:  Nonfocal  LABS: Lab Results  Component Value Date   CKTOTAL 22 02/19/2012   CKMB 2.0 02/19/2012   TROPONINI <0.30 08/25/2012   Results for orders placed during the hospital encounter of 08/24/12 (from the past 24 hour(s))  URINALYSIS, ROUTINE W REFLEX MICROSCOPIC     Status: Abnormal   Collection Time   08/25/12 12:41 PM      Component Value Range   Color, Urine YELLOW  YELLOW   APPearance CLEAR  CLEAR   Specific Gravity, Urine 1.014  1.005 - 1.030   pH 6.0  5.0 - 8.0   Glucose, UA NEGATIVE  NEGATIVE mg/dL   Hgb urine dipstick NEGATIVE  NEGATIVE   Bilirubin Urine NEGATIVE  NEGATIVE   Ketones, ur NEGATIVE  NEGATIVE mg/dL   Protein, ur NEGATIVE  NEGATIVE mg/dL   Urobilinogen, UA 0.2  0.0 - 1.0 mg/dL   Nitrite NEGATIVE  NEGATIVE   Leukocytes, UA MODERATE (*) NEGATIVE  URINE MICROSCOPIC-ADD ON     Status: Abnormal   Collection Time   08/25/12 12:41 PM      Component Value Range   Squamous Epithelial / LPF RARE  RARE   WBC, UA 11-20  <3 WBC/hpf   RBC / HPF 0-2  <3 RBC/hpf   Bacteria, UA FEW (*) RARE  TROPONIN I     Status: Normal   Collection Time   08/25/12  1:25 PM      Component Value Range   Troponin I <0.30  <0.30 ng/mL    HEPATIC FUNCTION PANEL     Status: Abnormal   Collection Time   08/25/12  7:32 PM      Component Value Range   Total Protein 6.4  6.0 - 8.3 g/dL   Albumin 3.0 (*) 3.5 - 5.2 g/dL   AST 18  0 - 37 U/L   ALT 11  0 - 35 U/L   Alkaline Phosphatase 16 (*) 39 - 117 U/L   Total Bilirubin 0.4  0.3 - 1.2 mg/dL   Bilirubin, Direct 0.2  0.0 - 0.3 mg/dL   Indirect Bilirubin 0.2 (*) 0.3 - 0.9 mg/dL  AMYLASE     Status: Normal   Collection Time   08/25/12  7:32 PM      Component Value Range   Amylase 48  0 - 105 U/L  LIPASE, BLOOD     Status: Normal   Collection Time   08/25/12  7:32 PM      Component Value Range   Lipase 26  11 - 59 U/L    Intake/Output Summary (Last 24 hours) at 08/26/12 0636  Last data filed at 08/25/12 2345  Gross per 24 hour  Intake    582 ml  Output   1275 ml  Net   -693 ml     ASSESSMENT AND PLAN:  1. Chest Pain:  This is non anginal.  No further cardiac workup is suggested.  2. Ischemic Cardiomyopathy, LVEF 35-40% by echo 10/01/2011, NYHA class 4 : She had a better urine output yesterday. I will convert back to oral diuretics today.  3. CAD s/p CABG in 2004 (grafts patent by cath 02/2010) :  As above  4. Chronic Kidney Disease (stage 4):  Creat is up slightly. We will of course watch this closely.  I will change back to the oral diuretic and check a basic metabolic profile tomorrow.  5. Anemia:  No active sign of bleeding. I will check stool guaiac which I have ordered. Her CBC was stable today. and follow up with a CBC in the AM.   6. UTI:  This is being managed and I appreciate the help of the hospital service.  7.  Constipation:  Large amount of stool in the colon.  However, patient says she doesn't feel constipated and that she had a bowel movement on Sunday. We will continue with a good bowel regimen.  Rollene Rotunda 08/26/2012 6:36 AM

## 2012-08-26 NOTE — Progress Notes (Addendum)
[facility] CONSULT PROGRESS NOTE  [name] [name] UJW:[id number] DOB: [birth year] DOA: 08/24/2012 PCP: Pamelia Hoit, MD  Assessment/Plan: Abdominal discomfort Multifactorial.  Etiology unclear, may be related to UTI versus urinary retention versus most likely constipation.  Patient improved symptoms improved from yesterday continue to monitor.  Abdominal x-ray on 08/25/2012 showed gastric distention with prominent stool burden in the distal colon.  Start tap water enema to help with constipation.  UTI Urine culture done on 08/09/2012 grew ESBL.  Repeat urine culture on 08/25/2012 showed no growth.  Continue empiric Zosyn till 08/28/2011 to define 3 day course of antibiotics.  Discussed with pharmacy nitrofurantoin in the setting of chronic kidney disease stage IV will not be effective.  Urinary retention Multifactorial: chronic pain meds, constipation issues, and UTI.  Patient was put on Foley catheter on 08/25/2012.  Start tamsulosin and consider a voiding trial tomorrow.  If patient does not improve may benefit from urology consultation.    Constipation Continue Dulcolax.  Discontinue fleets enema in the setting of chronic kidney disease stage IV.  Start tap water enema.  Start senna. Change Miralax to twice daily.  CKD IV Stable.  Baseline creatinine 1.4 - 1.8.  Close to baseline.  Continue to monitor.  Volume overload/acute on chronic diastolic congestive heart failure  Management as per primary service.  Continue diuresis with IV furosemide.  Generalized weakness Continue PT.  Anemia Continue to monitor. Hemoglobin stable.  Chest pain Resolved.  Code Status: DNR Family Communication: No family at bedside.  Disposition Plan: Pending, per primary service.  Antibiotics: Zosyn 08/25/2012 >> 08/27/2012  HPI/Subjective: Abdominal pain improved today.  Objective: Filed Vitals:   08/25/12 1834 08/25/12 2140 08/26/12 0140 08/26/12 0601  BP: 105/45 116/63 99/55  120/65  Pulse: 71 70 71 70  Temp: 98.8 F (37.1 C) 97.5 F (36.4 C) 98.2 F (36.8 C) 98.2 F (36.8 C)  TempSrc: Oral Oral Oral Oral  Resp: 18 18 16 18   Height:      Weight:    51.937 kg (114 lb 8 oz)  SpO2: 94% 100% 99% 100%    Intake/Output Summary (Last 24 hours) at 08/26/12 0957 Last data filed at 08/26/12 5621  Gross per 24 hour  Intake    702 ml  Output   1275 ml  Net   -573 ml   Filed Weights   08/25/12 0255 08/25/12 0538 08/26/12 0601  Weight: 51.7 kg (113 lb 15.7 oz) 51.4 kg (113 lb 5.1 oz) 51.937 kg (114 lb 8 oz)    Exam: Physical Exam: General: Awake, Oriented, No acute distress. HEENT: EOMI. Neck: Supple CV: S1 and S2 Lungs: Clear to ascultation bilaterally Abdomen: Soft, generalized abdominal pain, Nondistended, +bowel sounds. Ext: Good pulses. Trace edema.  Data Reviewed: Basic Metabolic Panel:  Lab 08/25/12 3086 08/25/12 0111 08/24/12 2216  NA 134* -- 134*  K 3.8 -- 3.0*  CL 98 -- 96  CO2 26 -- 26  GLUCOSE 114* -- 119*  BUN 33* -- 33*  CREATININE 1.80* -- 1.86*  CALCIUM 8.8 -- 9.2  MG -- 2.0 --  PHOS -- -- --   Liver Function Tests:  Lab 08/25/12 1932  AST 18  ALT 11  ALKPHOS 16*  BILITOT 0.4  PROT 6.4  ALBUMIN 3.0*    Lab 08/25/12 1932  LIPASE 26  AMYLASE 48   No results found for this basename: AMMONIA:5 in the last 168 hours CBC:  Lab 08/25/12 0620 08/24/12 2216  WBC 5.1 7.5  NEUTROABS --  5.8  HGB 8.3* 9.6*  HCT 26.4* 30.5*  MCV 89.5 88.7  PLT 177 211   Cardiac Enzymes:  Lab 08/25/12 1325 08/25/12 0620 08/25/12 0111  CKTOTAL -- -- --  CKMB -- -- --  CKMBINDEX -- -- --  TROPONINI <0.30 <0.30 <0.30   BNP (last 3 results)  Basename 08/25/12 0111 08/04/12 0132 06/03/12 2325  PROBNP 34554.0* 8068.0* 30403.0*   CBG: No results found for this basename: GLUCAP:5 in the last 168 hours  Recent Results (from the past 240 hour(s))  URINE CULTURE     Status: Normal   Collection Time   08/25/12 12:48 PM      Component  Value Range Status Comment   Specimen Description URINE, RANDOM   Final    Special Requests NONE   Final    Culture  Setup Time 08/25/2012 13:25   Final    Colony Count NO GROWTH   Final    Culture NO GROWTH   Final    Report Status 08/26/2012 FINAL   Final      Studies: Dg Chest 2 View  08/24/2012  *RADIOLOGY REPORT*  Clinical Data: Chest pain.  CHEST - 2 VIEW  Comparison: 08/04/2012  Findings: Previous CABG.  Left subclavian pacemaker stable. Vascular clips in the right mid abdomen.  Stable cardiomegaly.  No effusion.  Prominent perihilar interstitial opacities as before. Old left humeral neck fracture.  IMPRESSION:  1.  Stable cardiomegaly and postop changes.  No acute disease.   Original Report Authenticated By: D. Andria Rhein, MD    Dg Abd 1 View  08/25/2012  *RADIOLOGY REPORT*  Clinical Data: Nausea and vomiting  ABDOMEN - 1 VIEW  Comparison: 06/08/2011  Findings: The stomach is gas filled and distended.  No disproportionate dilatation of small bowel.  Mild colonic distention.  Prominent stool burden in the descending and sigmoid colon.  No obvious free intraperitoneal gas.  Postoperative changes.  IMPRESSION: Gastric distention.  Prominent stool burden in the distal colon.   Original Report Authenticated By: Jolaine Click, M.D.     Scheduled Meds:   . ALPRAZolam  0.25 mg Oral QHS  . amiodarone  100 mg Oral QHS  . aspirin  243 mg Oral Once  . aspirin EC  81 mg Oral Daily  . atorvastatin  10 mg Oral q1800  . carvedilol  12.5 mg Oral BID WC  . furosemide  20 mg Intravenous BID  . HYDROcodone-acetaminophen  1 tablet Oral Q4H  . isosorbide mononitrate  30 mg Oral Daily  . levothyroxine  50 mcg Oral QHS  . pantoprazole  40 mg Oral Daily  . piperacillin-tazobactam (ZOSYN)  IV  2.25 g Intravenous Q6H  . polyethylene glycol  17 g Oral Daily  . potassium chloride SA  40 mEq Oral BID   Continuous Infusions:   Principal Problem:  *UTI (lower urinary tract infection) Active Problems:   CKD (chronic kidney disease), stage IV  Constipation  Urinary retention   Odean Fester A  [facility] Pager [phone]. If 8PM-8AM, please contact night-coverage at [email or web address], password [facility] 08/26/2012, 9:57 AM  LOS: 2 days

## 2012-08-26 NOTE — ED Provider Notes (Signed)
Medical screening examination/treatment/procedure(s) were conducted as a shared visit with non-physician practitioner(s) and myself.  I personally evaluated the patient during the encounter Pt w hx cad, c/o cp.  Cxr. Labs. Ecg. Chest cta.   Suzi Roots, MD 08/26/12 [phone]

## 2012-08-26 NOTE — Progress Notes (Signed)
Pharmacist Heart Failure Core Measure Documentation  Assessment: [name] [name] has an EF documented as 35-40% on 10/01/11  by ECHO.  Rationale: Heart failure patients with left ventricular systolic dysfunction (LVSD) and an EF < 40% should be prescribed an angiotensin converting enzyme inhibitor (ACEI) or angiotensin receptor blocker (ARB) at discharge unless a contraindication is documented in the medical record.  This patient is not currently on an ACEI or ARB for HF.  This note is being placed in the record in order to provide documentation that a contraindication to the use of these agents is present for this encounter.  ACE Inhibitor or Angiotensin Receptor Blocker is contraindicated (specify all that apply)  [x]   ACEI allergy AND ARB allergy: Altace []   Angioedema []   Moderate or severe aortic stenosis []   Hyperkalemia []   Hypotension []   Renal artery stenosis [x]   Worsening renal function, preexisting renal disease or dysfunction   Fredrik Rigger 08/26/2012 9:56 AM

## 2012-08-27 ENCOUNTER — Inpatient Hospital Stay (HOSPITAL_COMMUNITY): Payer: Medicare Other

## 2012-08-27 DIAGNOSIS — I701 Atherosclerosis of renal artery: Secondary | ICD-10-CM

## 2012-08-27 LAB — BASIC METABOLIC PANEL
BUN: 39 mg/dL — ABNORMAL HIGH (ref 6–23)
Chloride: 95 mEq/L — ABNORMAL LOW (ref 96–112)
GFR calc Af Amer: 22 mL/min — ABNORMAL LOW (ref >90)
GFR calc non Af Amer: 19 mL/min — ABNORMAL LOW (ref >90)
Glucose, Bld: 83 mg/dL (ref 70–99)
Potassium: 4.9 mEq/L (ref 3.5–5.1)
Sodium: 133 mEq/L — ABNORMAL LOW (ref 135–145)

## 2012-08-27 LAB — CBC
HCT: 27.9 % — ABNORMAL LOW (ref 36.0–46.0)
Hemoglobin: 8.7 g/dL — ABNORMAL LOW (ref 12.0–15.0)
MCH: 28.2 pg (ref 26.0–34.0)
MCHC: 31.2 g/dL (ref 30.0–36.0)
RBC: 3.08 MIL/uL — ABNORMAL LOW (ref 3.87–5.11)

## 2012-08-27 MED ORDER — FUROSEMIDE 40 MG PO TABS
40.0000 mg | ORAL_TABLET | Freq: Two times a day (BID) | ORAL | Status: DC
  Administered 2012-08-27 – 2012-08-28 (×2): 40 mg via ORAL
  Filled 2012-08-27 (×4): qty 1

## 2012-08-27 MED ORDER — DOCUSATE SODIUM 100 MG PO CAPS
200.0000 mg | ORAL_CAPSULE | Freq: Two times a day (BID) | ORAL | Status: DC
  Administered 2012-08-27 (×2): 200 mg via ORAL
  Filled 2012-08-27 (×6): qty 2

## 2012-08-27 NOTE — Progress Notes (Signed)
Urinary catheter removed per MD for voiding trial; will continue to monitor.  Lorretta Harp RN

## 2012-08-27 NOTE — Progress Notes (Signed)
Patient had very small bowel movement, dime sized stool formed; will continue to monitor patient. Lorretta Harp RN

## 2012-08-27 NOTE — Progress Notes (Addendum)
SUBJECTIVE:  Constipated but no SOB.  Suprapubic pain is better.  PHYSICAL EXAM Filed Vitals:   08/26/12 1120 08/26/12 1449 08/26/12 2046 08/27/12 0600  BP: 116/53 97/63 114/62 116/68  Pulse:  75 70 70  Temp:  97.9 F (36.6 C) 97.9 F (36.6 C) 98.2 F (36.8 C)  TempSrc:  Oral Oral Oral  Resp:  18 18 18   Height:      Weight:    115 lb 1.6 oz (52.209 kg)  SpO2:  98% 100% 100%   General:  No acute distress but frail. HEENT:  PERRL Lungs:  Decreased breath sounds left lung. Heart:  RRR Abdomen:  Positive bowel sounds, no rebound no guarding. Extremities:  No edema Neuro:  Nonfocal  LABS: Lab Results  Component Value Date   CKTOTAL 22 02/19/2012   CKMB 2.0 02/19/2012   TROPONINI <0.30 08/25/2012   Results for orders placed during the hospital encounter of 08/24/12 (from the past 24 hour(s))  BASIC METABOLIC PANEL     Status: Abnormal   Collection Time   08/26/12 11:39 AM      Component Value Range   Sodium 133 (*) 135 - 145 mEq/L   Potassium 5.3 (*) 3.5 - 5.1 mEq/L   Chloride 95 (*) 96 - 112 mEq/L   CO2 26  19 - 32 mEq/L   Glucose, Bld 125 (*) 70 - 99 mg/dL   BUN 38 (*) 6 - 23 mg/dL   Creatinine, Ser 1.61 (*) 0.50 - 1.10 mg/dL   Calcium 8.9  8.4 - 09.6 mg/dL   GFR calc non Af Amer 22 (*) >90 mL/min   GFR calc Af Amer 25 (*) >90 mL/min  CBC     Status: Abnormal   Collection Time   08/26/12 11:39 AM      Component Value Range   WBC 7.3  4.0 - 10.5 K/uL   RBC 3.32 (*) 3.87 - 5.11 MIL/uL   Hemoglobin 9.5 (*) 12.0 - 15.0 g/dL   HCT 04.5 (*) 40.9 - 81.1 %   MCV 91.0  78.0 - 100.0 fL   MCH 28.6  26.0 - 34.0 pg   MCHC 31.5  30.0 - 36.0 g/dL   RDW 91.4 (*) 78.2 - 95.6 %   Platelets 156  150 - 400 K/uL  CBC     Status: Abnormal   Collection Time   08/27/12  5:00 AM      Component Value Range   WBC 5.1  4.0 - 10.5 K/uL   RBC 3.08 (*) 3.87 - 5.11 MIL/uL   Hemoglobin 8.7 (*) 12.0 - 15.0 g/dL   HCT 21.3 (*) 08.6 - 57.8 %   MCV 90.6  78.0 - 100.0 fL   MCH 28.2  26.0 -  34.0 pg   MCHC 31.2  30.0 - 36.0 g/dL   RDW 46.9 (*) 62.9 - 52.8 %   Platelets 175  150 - 400 K/uL    Intake/Output Summary (Last 24 hours) at 08/27/12 0654 Last data filed at 08/27/12 0601  Gross per 24 hour  Intake   1090 ml  Output   2000 ml  Net   -910 ml     ASSESSMENT AND PLAN:  1. Chest Pain:  This is non anginal.  No further cardiac workup is suggested.  2. Ischemic Cardiomyopathy, LVEF 35-40% by echo 10/01/2011, NYHA class 4 : She was given one day of IV Lasix.  Now back on PO and output is OK.   Given  the decreased breath sounds I will check a CXR.    3. CAD s/p CABG in 2004 (grafts patent by cath 02/2010) :  As above  4. Chronic Kidney Disease (stage 4):  Creat is up slightly. We will of course watch this closely.  I will change back to the oral diuretic and check a basic metabolic profile tomorrow.  Her potassium was slightly elevated but her supplement was held.  BMET this am is pending.   5. Anemia:  No active sign of bleeding. Stool guaiac was ordered. Her CBC was stable today.   We will follow  6. UTI:  Day three of Zosyn  7.  Constipation: Tap water enema and dulcolax ordered.   [name] [facility] 08/27/2012 6:54 AM

## 2012-08-27 NOTE — Progress Notes (Signed)
[facility]                                                                                Patient Demographics  [name] [name], is a 79 y.o. female  ZOX:[id number]  [id number]:[id number]  DOB - [birth year]  Admit date - 08/24/2012  Admitting Physician Duke Salvia, MD  Outpatient Primary MD for the patient is Pamelia Hoit, MD  LOS - 3   Chief Complaint  Patient presents with  . Chest Pain        Assessment & Plan    Assessment/Plan:   Abdominal discomfort  Multifactorial. Etiology unclear, combination of UTI, urinary retention and severe constipation. Patient improved symptoms improved from yesterday continue to monitor. Abdominal x-ray on 08/25/2012 showed gastric distention with prominent stool burden in the distal colon. Start tap water enema to help with constipation, along with bowel regimen, UTI has been treated, has Foley currently along with Flomax for urinary retention.     UTI  Urine culture done on 08/09/2012 grew ESBL. Repeat urine culture on 08/25/2012 showed no growth. Completed 3 day course of antibiotics.     Urinary retention  Multifactorial: chronic pain meds, constipation issues, and UTI. Patient was put on Foley catheter on 08/25/2012. On Flomax, we'll try to remove Foley and monitor clinically.    Constipation  Continue Dulcolax. Discontinue fleets enema in the setting of chronic kidney disease stage IV. Start tap water enema. Start senna. Change Miralax to twice daily.     CKD IV  Baseline creatinine 1.4 - 1.8. Creatinine has bumped a little, hold a.m. oral dose Lasix, monitor BMP closely, fluid status appears to be close to compensated, cardiology monitoring Lasix dose.   Acute on chronic diastolic congestive heart failure  Now appears close to compensated, creatinine is rising, cardiology primary and following closely, We'll reduce Lasix to oral and hold a.m. dose.   Generalized weakness  Continue PT.    Anemia    Continue to monitor. Hemoglobin stable.    Chest pain  Resolved. Per cardiology.    Code Status: DNR  Family Communication: Shouldn't  Disposition Plan: Pending, per primary service.    DVT Prophylaxis  SCDs will be ordered  Lab Results  Component Value Date   PLT 175 08/27/2012    Medications  Scheduled Meds:   . ALPRAZolam  0.25 mg Oral QHS  . amiodarone  100 mg Oral QHS  . aspirin EC  81 mg Oral Daily  . atorvastatin  10 mg Oral q1800  . carvedilol  12.5 mg Oral BID WC  . docusate sodium  200 mg Oral BID  . furosemide  40 mg Oral BID  . HYDROcodone-acetaminophen  1 tablet Oral Q4H  . isosorbide mononitrate  30 mg Oral Daily  . levothyroxine  50 mcg Oral QHS  . pantoprazole  40 mg Oral Daily  . piperacillin-tazobactam (ZOSYN)  IV  2.25 g Intravenous Q6H  . polyethylene glycol  17 g Oral BID  . senna  1 tablet Oral QHS  . Tamsulosin HCl  0.4 mg Oral Daily   Continuous Infusions:  PRN Meds:.acetaminophen, bisacodyl, nitroGLYCERIN, ondansetron (ZOFRAN) IV  Antibiotics    Anti-infectives  Start     Dose/Rate Route Frequency Ordered Stop   08/26/12 1800  piperacillin-tazobactam (ZOSYN) IVPB 2.25 g       2.25 g 100 mL/hr over 30 Minutes Intravenous 4 times per day 08/26/12 1442 08/27/12 2359   08/25/12 1100   piperacillin-tazobactam (ZOSYN) IVPB 2.25 g  Status:  Discontinued        2.25 g 100 mL/hr over 30 Minutes Intravenous 4 times per day 08/25/12 1005 08/26/12 1414           Time Spent in minutes   30   Calianne Larue K M.D on 08/27/2012 at 12:07 PM  Between 7am to 7pm - Pager - [phone]  After 7pm go to [email or web address] - password [email or web address]  And look for the night coverage person covering for me after hours  [facility] Office  [phone]    Subjective:   [name] [name] today has, No headache, No chest pain, obstipation along with mild generalized abdominal ache - No Nausea, No new weakness tingling or numbness, No Cough -  SOB.    Objective:   Filed Vitals:   08/26/12 1449 08/26/12 2046 08/27/12 0600 08/27/12 1035  BP: 97/63 114/62 116/68 97/60  Pulse: 75 70 70 70  Temp: 97.9 F (36.6 C) 97.9 F (36.6 C) 98.2 F (36.8 C) 98.4 F (36.9 C)  TempSrc: Oral Oral Oral Oral  Resp: 18 18 18    Height:      Weight:   52.209 kg (115 lb 1.6 oz)   SpO2: 98% 100% 100% 92%    Wt Readings from Last 3 Encounters:  08/27/12 52.209 kg (115 lb 1.6 oz)  07/09/12 50.803 kg (112 lb)  06/16/12 51.619 kg (113 lb 12.8 oz)     Intake/Output Summary (Last 24 hours) at 08/27/12 1207 Last data filed at 08/27/12 1024  Gross per 24 hour  Intake   1020 ml  Output   1750 ml  Net   -730 ml    Exam Awake Alert, Oriented X 3, No new F.N deficits, Normal affect [place].AT,PERRAL Supple Neck,No JVD, No cervical lymphadenopathy appriciated.  Symmetrical Chest wall movement, Good air movement bilaterally, CTAB RRR,No Gallops,Rubs or new Murmurs, No Parasternal Heave +ve B.Sounds, Abd Soft, Non tender, No organomegaly appriciated, No rebound - guarding or rigidity. No Cyanosis, Clubbing or edema, No new Rash or bruise      Data Review   Micro Results Recent Results (from the past 240 hour(s))  URINE CULTURE     Status: Normal   Collection Time   08/25/12 12:48 PM      Component Value Range Status Comment   Specimen Description URINE, RANDOM   Final    Special Requests NONE   Final    Culture  Setup Time 08/25/2012 13:25   Final    Colony Count NO GROWTH   Final    Culture NO GROWTH   Final    Report Status 08/26/2012 FINAL   Final     Radiology Reports Dg Chest 2 View  08/27/2012  *RADIOLOGY REPORT*  Clinical Data: Decreased breath sounds on the left.  Short of breath.  CHEST - 2 VIEW  Comparison: 08/24/2012  Findings: Changes from prior CABG surgery are stable.  The cardiac silhouette is mildly enlarged.  No mediastinal or hilar mass or adenopathy is noted.  The left anterior chest wall sequential pacemaker is also  stable and well positioned.  There are mild coarse reticular opacities in the lung bases, more prominent on the right,  and lung apices, most consistent with chronic scarring.  There are no acute findings in the lungs.  No pleural effusion or pneumothorax.  Chronic fracture of the left proximal humerus is stable from the prior exam.  IMPRESSION: No acute cardiopulmonary disease.  No change from the recent prior study.   Original Report Authenticated By: Amie Portland, M.D.    Dg Chest 2 View  08/24/2012  *RADIOLOGY REPORT*  Clinical Data: Chest pain.  CHEST - 2 VIEW  Comparison: 08/04/2012  Findings: Previous CABG.  Left subclavian pacemaker stable. Vascular clips in the right mid abdomen.  Stable cardiomegaly.  No effusion.  Prominent perihilar interstitial opacities as before. Old left humeral neck fracture.  IMPRESSION:  1.  Stable cardiomegaly and postop changes.  No acute disease.   Original Report Authenticated By: D. Andria Rhein, MD    Dg Chest 2 View  08/04/2012  *RADIOLOGY REPORT*  Clinical Data: Generalized aches and headache; weakness.  CHEST - 2 VIEW  Comparison: Chest radiograph performed 10/22 1013  Findings: The lungs are well-aerated.  Mild right midlung airspace opacity could reflect atelectasis or scarring, or could simply reflect the overlying scapula.  There is no evidence of pleural effusion or pneumothorax.  The heart is borderline enlarged.  The patient is status post median sternotomy, with evidence of prior CABG.  A pacemaker is noted at the left chest wall, with leads ending at the right atrium and right ventricle.  There is chronic deformity involving the proximal left humerus, with mild apparent healing response.  IMPRESSION:  1.  Mild right midlung airspace opacity could reflect atelectasis or scarring, or could simply reflect the overlying scapula.  No definite acute focal airspace consolidation seen. 2.  Borderline cardiomegaly.   Original Report Authenticated By: Tonia Ghent,  M.D.    Dg Abd 1 View  08/25/2012  *RADIOLOGY REPORT*  Clinical Data: Nausea and vomiting  ABDOMEN - 1 VIEW  Comparison: 06/08/2011  Findings: The stomach is gas filled and distended.  No disproportionate dilatation of small bowel.  Mild colonic distention.  Prominent stool burden in the descending and sigmoid colon.  No obvious free intraperitoneal gas.  Postoperative changes.  IMPRESSION: Gastric distention.  Prominent stool burden in the distal colon.   Original Report Authenticated By: Jolaine Click, M.D.    Ct Head Wo Contrast  08/04/2012  *RADIOLOGY REPORT*  Clinical Data: Generalized aches and headache.  CT HEAD WITHOUT CONTRAST  Technique:  Contiguous axial images were obtained from the base of the skull through the vertex without contrast.  Comparison: CT of the head performed 02/18/2012  Findings: There is no evidence of acute infarction, mass lesion, or intra- or extra-axial hemorrhage on CT.  Prominence of the ventricles and sulci reflects mild cortical volume loss.  Diffuse periventricular and subcortical white matter change likely reflects small vessel ischemic microangiopathy. Cerebellar atrophy is noted.  A chronic infarct is noted at the anterior left basal ganglia.  The brainstem and fourth ventricle are within normal limits.  The cerebral hemispheres demonstrate grossly normal gray-white differentiation.  No mass effect or midline shift is seen.   There is no evidence of fracture; visualized osseous structures are unremarkable in appearance.  The visualized portions of the orbits are within normal limits.  The paranasal sinuses and mastoid air cells are well-aerated.  No significant soft tissue abnormalities are seen.  IMPRESSION:  1.  No acute intracranial pathology seen on CT. 2.  Mild cortical volume loss and diffuse small vessel ischemic microangiopathy.  3.  Chronic infarct at the anterior left basal ganglia.   Original Report Authenticated By: Tonia Ghent, M.D.     CBC  Lab  08/27/12 0500 08/26/12 1139 08/25/12 0620 08/24/12 2216  WBC 5.1 7.3 5.1 7.5  HGB 8.7* 9.5* 8.3* 9.6*  HCT 27.9* 30.2* 26.4* 30.5*  PLT 175 156 177 211  MCV 90.6 91.0 89.5 88.7  MCH 28.2 28.6 28.1 27.9  MCHC 31.2 31.5 31.4 31.5  RDW 18.4* 18.8* 18.2* 18.1*  LYMPHSABS -- -- -- 1.0  MONOABS -- -- -- 0.7  EOSABS -- -- -- 0.1  BASOSABS -- -- -- 0.0  BANDABS -- -- -- --    Chemistries   Lab 08/27/12 0500 08/26/12 1139 08/25/12 1932 08/25/12 0620 08/25/12 0111 08/24/12 2216  NA 133* 133* -- 134* -- 134*  K 4.9 5.3* -- 3.8 -- 3.0*  CL 95* 95* -- 98 -- 96  CO2 28 26 -- 26 -- 26  GLUCOSE 83 125* -- 114* -- 119*  BUN 39* 38* -- 33* -- 33*  CREATININE 2.19* 1.99* -- 1.80* -- 1.86*  CALCIUM 9.0 8.9 -- 8.8 -- 9.2  MG -- -- -- -- 2.0 --  AST -- -- 18 -- -- --  ALT -- -- 11 -- -- --  ALKPHOS -- -- 16* -- -- --  BILITOT -- -- 0.4 -- -- --   ------------------------------------------------------------------------------------------------------------------ estimated creatinine clearance is 15.5 ml/min (by C-G formula based on Cr of 2.19). ------------------------------------------------------------------------------------------------------------------  Basename 08/25/12 0111  HGBA1C 6.1*   ------------------------------------------------------------------------------------------------------------------  Basename 08/25/12 0620  CHOL 85  HDL 27*  LDLCALC 43  TRIG 73  CHOLHDL 3.1  LDLDIRECT --   ------------------------------------------------------------------------------------------------------------------  Basename 08/25/12 0111  TSH 4.935*  T4TOTAL --  T3FREE --  THYROIDAB --   ------------------------------------------------------------------------------------------------------------------ No results found for this basename: VITAMINB12:2,FOLATE:2,FERRITIN:2,TIBC:2,IRON:2,RETICCTPCT:2 in the last 72 hours  Coagulation profile No results found for this basename:  INR:5,PROTIME:5 in the last 168 hours  No results found for this basename: DDIMER:2 in the last 72 hours  Cardiac Enzymes  Lab 08/25/12 1325 08/25/12 0620 08/25/12 0111  CKMB -- -- --  TROPONINI <0.30 <0.30 <0.30  MYOGLOBIN -- -- --   ------------------------------------------------------------------------------------------------------------------ No components found with this basename: POCBNP:3

## 2012-08-27 NOTE — Progress Notes (Signed)
Patient ambulated with PT in hallway; O2 weaned off for an hour O2 sats on room air is 95% but patient states she is "smothering" and needs O2, nasal cannula placed back on patient; will continue to monitor patient.  Lorretta Harp RN

## 2012-08-27 NOTE — Progress Notes (Signed)
Physical Therapy Treatment Patient Details Name: [name] [name] MRN: [id number] DOB: 01/27/1925 Today's Date: 08/27/2012 Time: 1191-4782 PT Time Calculation (min): 28 min  PT Assessment / Plan / Recommendation Comments on Treatment Session  Admitted for chest pain which per cardiology is not cardiac related. Pt constipated with abdominal pain. Generalized weakness persists but she was more able to participate today. Will need 24 hour supervision at home with minA for transfers and gait.     Follow Up Recommendations  Home health PT;Supervision/Assistance - 24 hour     Does the patient have the potential to tolerate intense rehabilitation     Barriers to Discharge        Equipment Recommendations  Rolling walker with 5" wheels    Recommendations for Other Services OT consult  Frequency     Plan Discharge plan remains appropriate;Frequency remains appropriate    Precautions / Restrictions Precautions Precautions: Fall Restrictions Weight Bearing Restrictions: No    Mobility  Bed Mobility Bed Mobility: Supine to Sit Supine to Sit: 5: Supervision Details for Bed Mobility Assistance: no physical assist, supervisionf or safety Transfers Transfers: Sit to Stand;Stand to Sit Sit to Stand: 4: Min assist Stand to Sit: 4: Min guard Details for Transfer Assistance: pt pulling therapists hand to bring her up from seated position and stabilize herself, cues for safety with hand placement and to control descent to chair Ambulation/Gait Ambulation/Gait Assistance: 4: Min assist Ambulation Distance (Feet): 200 Feet Assistive device: 1 person hand held assist;Rolling walker Ambulation/Gait Assistance Details: Ambulated approx 175 ft with HHA and minA for stability, with RW pt ambulated around the bed in the room needing specific cues for negotiation of RW in tight spaces and especially with turns Gait Pattern: Trunk flexed;Shuffle;Decreased stride length    Exercises General Exercises  - Lower Extremity Long Arc Quad: AROM;Both;20 reps;Seated Hip ABduction/ADduction: AROM;Both;10 reps;Seated (manual resistance provided for ABD, pillow squeeze for ADD) Hip Flexion/Marching: AROM;Both;20 reps;Seated Toe Raises: AROM;Both;20 reps;Seated Heel Raises: AROM;Both;20 reps;Seated     PT Goals Acute Rehab PT Goals PT Goal: Supine/Side to Sit - Progress: Progressing toward goal PT Goal: Sit to Supine/Side - Progress: Progressing toward goal PT Goal: Sit to Stand - Progress: Progressing toward goal PT Goal: Stand to Sit - Progress: Progressing toward goal PT Transfer Goal: Bed to Chair/Chair to Bed - Progress: Progressing toward goal PT Goal: Ambulate - Progress: Progressing toward goal PT Goal: Perform Home Exercise Program - Progress: Progressing toward goal  Visit Information  Last PT Received On: 08/27/12 Assistance Needed: +1    Subjective Data  Subjective: I was getting around fine at home.   Cognition  Overall Cognitive Status: Impaired Area of Impairment: Memory;Problem solving Arousal/Alertness: Awake/alert Orientation Level: Appears intact for tasks assessed Memory Deficits: still repeating her self a lot Problem Solving: difficulty problem solving use of RW needing step by step instructions for feet vs RW movement especially with turning    Balance     End of Session PT - End of Session Equipment Utilized During Treatment: Gait belt Activity Tolerance: Patient tolerated treatment well Patient left: in chair;with call bell/phone within reach Nurse Communication: Mobility status   GP     [facility] HELEN 08/27/2012, 2:06 PM

## 2012-08-28 DIAGNOSIS — N184 Chronic kidney disease, stage 4 (severe): Secondary | ICD-10-CM

## 2012-08-28 LAB — BASIC METABOLIC PANEL
BUN: 38 mg/dL — ABNORMAL HIGH (ref 6–23)
Calcium: 9 mg/dL (ref 8.4–10.5)
Creatinine, Ser: 2.12 mg/dL — ABNORMAL HIGH (ref 0.50–1.10)
GFR calc Af Amer: 23 mL/min — ABNORMAL LOW (ref >90)
GFR calc non Af Amer: 20 mL/min — ABNORMAL LOW (ref >90)
Glucose, Bld: 81 mg/dL (ref 70–99)
Potassium: 4.3 mEq/L (ref 3.5–5.1)

## 2012-08-28 NOTE — Progress Notes (Signed)
    SUBJECTIVE:  She feels much better after a large BM yesterday. No chest pain.  PHYSICAL EXAM Filed Vitals:   08/27/12 1035 08/27/12 1403 08/27/12 2035 08/28/12 0511  BP: 97/60 103/51 112/54 115/56  Pulse: 70 67 69 70  Temp: 98.4 F (36.9 C) 98 F (36.7 C) 98.7 F (37.1 C) 98.3 F (36.8 C)  TempSrc: Oral Oral Oral Oral  Resp:  20 20 20   Height:      Weight:    119 lb 6.4 oz (54.159 kg)  SpO2: 92% 99% 99% 99%   General:  No acute distress but frail. Lungs:  Decreased breath sounds left lung unchanged Heart:  RRR Abdomen:  Positive bowel sounds, no rebound no guarding. Extremities:  No edema   LABS: Lab Results  Component Value Date   CKTOTAL 22 02/19/2012   CKMB 2.0 02/19/2012   TROPONINI <0.30 08/25/2012   Results for orders placed during the hospital encounter of 08/24/12 (from the past 24 hour(s))  BASIC METABOLIC PANEL     Status: Abnormal   Collection Time   08/28/12  5:25 AM      Component Value Range   Sodium 136  135 - 145 mEq/L   Potassium 4.3  3.5 - 5.1 mEq/L   Chloride 96  96 - 112 mEq/L   CO2 28  19 - 32 mEq/L   Glucose, Bld 81  70 - 99 mg/dL   BUN 38 (*) 6 - 23 mg/dL   Creatinine, Ser 4.54 (*) 0.50 - 1.10 mg/dL   Calcium 9.0  8.4 - 09.8 mg/dL   GFR calc non Af Amer 20 (*) >90 mL/min   GFR calc Af Amer 23 (*) >90 mL/min    Intake/Output Summary (Last 24 hours) at 08/28/12 0836 Last data filed at 08/28/12 0630  Gross per 24 hour  Intake    640 ml  Output   1081 ml  Net   -441 ml     ASSESSMENT AND PLAN:  1. Chest Pain:  This is non anginal.  No further cardiac workup is suggested.  2. Ischemic Cardiomyopathy, LVEF 35-40% by echo 10/01/2011, NYHA class 4 : She was given one day of IV Lasix.  CXR without acute pulmonary changes.  Hold PO diuretic as below.  3. CAD s/p CABG in 2004 (grafts patent by cath 02/2010) :  As above  4. Chronic Kidney Disease (stage 4):  Creat is up slightly again. I will hold her diuretic today.  5. Anemia:  No active  sign of bleeding.  No results yet back on stool guaiac.  6. UTI:  Completed antibiotics.  Foley out.  Now can be followed as an outpatient  7.  Constipation:  She feels much better after a large BM yesterday.  I very much appreciate IM help. I would like some suggestions on a home bowel regimen.    Rollene Rotunda 08/28/2012 8:36 AM

## 2012-08-28 NOTE — Progress Notes (Signed)
08/28/12 Patient post void residual done this am. Patient voided and there was 187 ml of residual. Anastasia Fiedler RN

## 2012-08-28 NOTE — Progress Notes (Signed)
[facility]                                                                                Patient Demographics  [name] [name], is a 79 y.o. female  ZOX:[id number]  [id number]:[id number]  DOB - 1924-10-29  Admit date - 08/24/2012  Admitting Physician Duke Salvia, MD  Outpatient Primary MD for the patient is Pamelia Hoit, MD  LOS - 4   Chief Complaint  Patient presents with  . Chest Pain        Assessment & Plan    Hospitalist team will peripherally follow the patient please call us with any questions.    Assessment/Plan:   Abdominal discomfort   Multifactorial. Etiology unclear, combination of UTI, urinary retention and severe constipation. Patient improved symptoms improved now abdominal pain free. Abdominal x-ray on 08/25/2012 showed gastric distention with prominent stool burden in the distal colon. Had a huge bowel movement on 08/27/2012 night for now continue with bowel regimen, UTI has been treated, retention improved we'll continue to monitor her Foley has been removed.    UTI  Urine culture done on 08/09/2012 grew ESBL. Repeat urine culture on 08/25/2012 showed no growth. Completed 3 day course of antibiotics.     Urinary retention  Multifactorial: chronic pain meds, constipation issues, and UTI. Patient was put on Foley catheter on 08/25/2012. On Flomax, we'll try to remove Foley and monitor clinically.    Constipation  Continue Dulcolax. Discontinue fleets enema in the setting of chronic kidney disease stage IV. Start tap water enema. Start senna. Change Miralax to twice daily.     CKD IV  Baseline creatinine 1.4 - 1.8. Creatinine has bumped a little, hold a.m. oral dose Lasix, monitor BMP closely, fluid status appears to be close to compensated, cardiology monitoring Lasix dose.   Acute on chronic diastolic congestive heart failure and Chest pain Now appears close to compensated, creatinine is rising, cardiology primary and  following closely, We'll reduce Lasix to oral and hold a.m. dose. We'll defer further control of diuretics to primary team cardiology.   Generalized weakness  Continue PT.    Anemia  Continue to monitor. Hemoglobin stable.        Code Status: DNR  Family Communication: Shouldn't  Disposition Plan: Pending, per primary service.    DVT Prophylaxis  SCDs will be ordered  Lab Results  Component Value Date   PLT 175 08/27/2012    Medications  Scheduled Meds:    . ALPRAZolam  0.25 mg Oral QHS  . amiodarone  100 mg Oral QHS  . aspirin EC  81 mg Oral Daily  . atorvastatin  10 mg Oral q1800  . carvedilol  12.5 mg Oral BID WC  . docusate sodium  200 mg Oral BID  . HYDROcodone-acetaminophen  1 tablet Oral Q4H  . isosorbide mononitrate  30 mg Oral Daily  . levothyroxine  50 mcg Oral QHS  . pantoprazole  40 mg Oral Daily  . polyethylene glycol  17 g Oral BID  . senna  1 tablet Oral QHS  . Tamsulosin HCl  0.4 mg Oral Daily   Continuous Infusions:  PRN Meds:.acetaminophen, bisacodyl, nitroGLYCERIN, ondansetron (ZOFRAN) IV  Antibiotics    Anti-infectives     Start     Dose/Rate Route Frequency Ordered Stop   08/26/12 1800  piperacillin-tazobactam (ZOSYN) IVPB 2.25 g       2.25 g 100 mL/hr over 30 Minutes Intravenous 4 times per day 08/26/12 1442 08/27/12 1808   08/25/12 1100   piperacillin-tazobactam (ZOSYN) IVPB 2.25 g  Status:  Discontinued        2.25 g 100 mL/hr over 30 Minutes Intravenous 4 times per day 08/25/12 1005 08/26/12 1414           Time Spent in minutes   30   Derk Doubek K M.D on 08/28/2012 at 10:20 AM  Between 7am to 7pm - Pager - [phone]  After 7pm go to [email or web address] - password [email or web address]  And look for the night coverage person covering for me after hours  [facility] Office  [phone]    Subjective:   [name] [name] today has, No headache, No chest pain, obstipation along with mild generalized abdominal ache - No  Nausea, No new weakness tingling or numbness, No Cough - SOB.    Objective:   Filed Vitals:   08/27/12 1403 08/27/12 2035 08/28/12 0511 08/28/12 1012  BP: 103/51 112/54 115/56 112/54  Pulse: 67 69 70 73  Temp: 98 F (36.7 C) 98.7 F (37.1 C) 98.3 F (36.8 C) 98.2 F (36.8 C)  TempSrc: Oral Oral Oral Oral  Resp: 20 20 20 20   Height:      Weight:   54.159 kg (119 lb 6.4 oz)   SpO2: 99% 99% 99% 97%    Wt Readings from Last 3 Encounters:  08/28/12 54.159 kg (119 lb 6.4 oz)  07/09/12 50.803 kg (112 lb)  06/16/12 51.619 kg (113 lb 12.8 oz)     Intake/Output Summary (Last 24 hours) at 08/28/12 1020 Last data filed at 08/28/12 0853  Gross per 24 hour  Intake    880 ml  Output   1081 ml  Net   -201 ml    Exam Awake Alert, Oriented X 3, No new F.N deficits, Normal affect [place].AT,PERRAL Supple Neck,No JVD, No cervical lymphadenopathy appriciated.  Symmetrical Chest wall movement, Good air movement bilaterally, CTAB RRR,No Gallops,Rubs or new Murmurs, No Parasternal Heave +ve B.Sounds, Abd Soft, Non tender, No organomegaly appriciated, No rebound - guarding or rigidity. No Cyanosis, Clubbing or edema, No new Rash or bruise      Data Review   Micro Results Recent Results (from the past 240 hour(s))  URINE CULTURE     Status: Normal   Collection Time   08/25/12 12:48 PM      Component Value Range Status Comment   Specimen Description URINE, RANDOM   Final    Special Requests NONE   Final    Culture  Setup Time 08/25/2012 13:25   Final    Colony Count NO GROWTH   Final    Culture NO GROWTH   Final    Report Status 08/26/2012 FINAL   Final     Radiology Reports Dg Chest 2 View  08/27/2012  *RADIOLOGY REPORT*  Clinical Data: Decreased breath sounds on the left.  Short of breath.  CHEST - 2 VIEW  Comparison: 08/24/2012  Findings: Changes from prior CABG surgery are stable.  The cardiac silhouette is mildly enlarged.  No mediastinal or hilar mass or adenopathy is noted.  The  left anterior chest wall sequential pacemaker is also stable and well positioned.  There are mild coarse reticular  opacities in the lung bases, more prominent on the right, and lung apices, most consistent with chronic scarring.  There are no acute findings in the lungs.  No pleural effusion or pneumothorax.  Chronic fracture of the left proximal humerus is stable from the prior exam.  IMPRESSION: No acute cardiopulmonary disease.  No change from the recent prior study.   Original Report Authenticated By: Amie Portland, M.D.    Dg Chest 2 View  08/24/2012  *RADIOLOGY REPORT*  Clinical Data: Chest pain.  CHEST - 2 VIEW  Comparison: 08/04/2012  Findings: Previous CABG.  Left subclavian pacemaker stable. Vascular clips in the right mid abdomen.  Stable cardiomegaly.  No effusion.  Prominent perihilar interstitial opacities as before. Old left humeral neck fracture.  IMPRESSION:  1.  Stable cardiomegaly and postop changes.  No acute disease.   Original Report Authenticated By: D. Andria Rhein, MD    Dg Chest 2 View  08/04/2012  *RADIOLOGY REPORT*  Clinical Data: Generalized aches and headache; weakness.  CHEST - 2 VIEW  Comparison: Chest radiograph performed 10/22 1013  Findings: The lungs are well-aerated.  Mild right midlung airspace opacity could reflect atelectasis or scarring, or could simply reflect the overlying scapula.  There is no evidence of pleural effusion or pneumothorax.  The heart is borderline enlarged.  The patient is status post median sternotomy, with evidence of prior CABG.  A pacemaker is noted at the left chest wall, with leads ending at the right atrium and right ventricle.  There is chronic deformity involving the proximal left humerus, with mild apparent healing response.  IMPRESSION:  1.  Mild right midlung airspace opacity could reflect atelectasis or scarring, or could simply reflect the overlying scapula.  No definite acute focal airspace consolidation seen. 2.  Borderline  cardiomegaly.   Original Report Authenticated By: Tonia Ghent, M.D.    Dg Abd 1 View  08/25/2012  *RADIOLOGY REPORT*  Clinical Data: Nausea and vomiting  ABDOMEN - 1 VIEW  Comparison: 06/08/2011  Findings: The stomach is gas filled and distended.  No disproportionate dilatation of small bowel.  Mild colonic distention.  Prominent stool burden in the descending and sigmoid colon.  No obvious free intraperitoneal gas.  Postoperative changes.  IMPRESSION: Gastric distention.  Prominent stool burden in the distal colon.   Original Report Authenticated By: Jolaine Click, M.D.    Ct Head Wo Contrast  08/04/2012  *RADIOLOGY REPORT*  Clinical Data: Generalized aches and headache.  CT HEAD WITHOUT CONTRAST  Technique:  Contiguous axial images were obtained from the base of the skull through the vertex without contrast.  Comparison: CT of the head performed 02/18/2012  Findings: There is no evidence of acute infarction, mass lesion, or intra- or extra-axial hemorrhage on CT.  Prominence of the ventricles and sulci reflects mild cortical volume loss.  Diffuse periventricular and subcortical white matter change likely reflects small vessel ischemic microangiopathy. Cerebellar atrophy is noted.  A chronic infarct is noted at the anterior left basal ganglia.  The brainstem and fourth ventricle are within normal limits.  The cerebral hemispheres demonstrate grossly normal gray-white differentiation.  No mass effect or midline shift is seen.   There is no evidence of fracture; visualized osseous structures are unremarkable in appearance.  The visualized portions of the orbits are within normal limits.  The paranasal sinuses and mastoid air cells are well-aerated.  No significant soft tissue abnormalities are seen.  IMPRESSION:  1.  No acute intracranial pathology seen on CT. 2.  Mild cortical volume loss and diffuse small vessel ischemic microangiopathy. 3.  Chronic infarct at the anterior left basal ganglia.   Original  Report Authenticated By: Tonia Ghent, M.D.     CBC  Lab 08/27/12 0500 08/26/12 1139 08/25/12 0620 08/24/12 2216  WBC 5.1 7.3 5.1 7.5  HGB 8.7* 9.5* 8.3* 9.6*  HCT 27.9* 30.2* 26.4* 30.5*  PLT 175 156 177 211  MCV 90.6 91.0 89.5 88.7  MCH 28.2 28.6 28.1 27.9  MCHC 31.2 31.5 31.4 31.5  RDW 18.4* 18.8* 18.2* 18.1*  LYMPHSABS -- -- -- 1.0  MONOABS -- -- -- 0.7  EOSABS -- -- -- 0.1  BASOSABS -- -- -- 0.0  BANDABS -- -- -- --    Chemistries   Lab 08/28/12 0525 08/27/12 0500 08/26/12 1139 08/25/12 1932 08/25/12 0620 08/25/12 0111 08/24/12 2216  NA 136 133* 133* -- 134* -- 134*  K 4.3 4.9 5.3* -- 3.8 -- 3.0*  CL 96 95* 95* -- 98 -- 96  CO2 28 28 26  -- 26 -- 26  GLUCOSE 81 83 125* -- 114* -- 119*  BUN 38* 39* 38* -- 33* -- 33*  CREATININE 2.12* 2.19* 1.99* -- 1.80* -- 1.86*  CALCIUM 9.0 9.0 8.9 -- 8.8 -- 9.2  MG -- -- -- -- -- 2.0 --  AST -- -- -- 18 -- -- --  ALT -- -- -- 11 -- -- --  ALKPHOS -- -- -- 16* -- -- --  BILITOT -- -- -- 0.4 -- -- --   ------------------------------------------------------------------------------------------------------------------ estimated creatinine clearance is 16.6 ml/min (by C-G formula based on Cr of 2.12). ------------------------------------------------------------------------------------------------------------------ No results found for this basename: HGBA1C:2 in the last 72 hours ------------------------------------------------------------------------------------------------------------------ No results found for this basename: CHOL:2,HDL:2,LDLCALC:2,TRIG:2,CHOLHDL:2,LDLDIRECT:2 in the last 72 hours ------------------------------------------------------------------------------------------------------------------ No results found for this basename: TSH,T4TOTAL,FREET3,T3FREE,THYROIDAB in the last 72 hours ------------------------------------------------------------------------------------------------------------------ No results found for  this basename: VITAMINB12:2,FOLATE:2,FERRITIN:2,TIBC:2,IRON:2,RETICCTPCT:2 in the last 72 hours  Coagulation profile No results found for this basename: INR:5,PROTIME:5 in the last 168 hours  No results found for this basename: DDIMER:2 in the last 72 hours  Cardiac Enzymes  Lab 08/25/12 1325 08/25/12 0620 08/25/12 0111  CKMB -- -- --  TROPONINI <0.30 <0.30 <0.30  MYOGLOBIN -- -- --   ------------------------------------------------------------------------------------------------------------------ No components found with this basename: POCBNP:3

## 2012-08-29 ENCOUNTER — Telehealth: Payer: Self-pay | Admitting: Cardiology

## 2012-08-29 ENCOUNTER — Encounter (HOSPITAL_COMMUNITY): Payer: Self-pay | Admitting: Physician Assistant

## 2012-08-29 ENCOUNTER — Other Ambulatory Visit: Payer: Self-pay | Admitting: Physician Assistant

## 2012-08-29 DIAGNOSIS — I5023 Acute on chronic systolic (congestive) heart failure: Secondary | ICD-10-CM

## 2012-08-29 DIAGNOSIS — K5900 Constipation, unspecified: Secondary | ICD-10-CM

## 2012-08-29 DIAGNOSIS — I4891 Unspecified atrial fibrillation: Secondary | ICD-10-CM

## 2012-08-29 LAB — BASIC METABOLIC PANEL
Calcium: 9 mg/dL (ref 8.4–10.5)
GFR calc Af Amer: 26 mL/min — ABNORMAL LOW (ref >90)
GFR calc non Af Amer: 22 mL/min — ABNORMAL LOW (ref >90)
Glucose, Bld: 88 mg/dL (ref 70–99)
Potassium: 4.3 mEq/L (ref 3.5–5.1)
Sodium: 133 mEq/L — ABNORMAL LOW (ref 135–145)

## 2012-08-29 LAB — CBC
Hemoglobin: 8.8 g/dL — ABNORMAL LOW (ref 12.0–15.0)
MCH: 28.1 pg (ref 26.0–34.0)
MCHC: 31.3 g/dL (ref 30.0–36.0)
RDW: 17.9 % — ABNORMAL HIGH (ref 11.5–15.5)

## 2012-08-29 MED ORDER — DOCUSATE SODIUM 100 MG PO CAPS: 200.0000 mg | ORAL_CAPSULE | Freq: Two times a day (BID) | ORAL | Status: DC

## 2012-08-29 MED ORDER — TAMSULOSIN HCL 0.4 MG PO CAPS: 0.4000 mg | ORAL_CAPSULE | Freq: Every day | ORAL | Status: AC

## 2012-08-29 MED ORDER — FUROSEMIDE 40 MG PO TABS: 40.0000 mg | ORAL_TABLET | Freq: Every day | ORAL | Status: DC

## 2012-08-29 MED ORDER — ISOSORBIDE MONONITRATE ER 30 MG PO TB24: 30.0000 mg | ORAL_TABLET | Freq: Every day | ORAL | Status: DC

## 2012-08-29 MED ORDER — POLYETHYLENE GLYCOL 3350 17 G PO PACK: 17.0000 g | PACK | Freq: Every day | ORAL | Status: DC | PRN

## 2012-08-29 NOTE — Progress Notes (Signed)
Patient resides at home with daughters- alternating every 2 weeks.  Physical Therapy had recommended home health PT- however, daughters verbalized concerns over their ability to manage her care at home. Daughters report frequent falls and that she is becoming more debilitated.  They requested short term SNF- however, in the course of the day- MD came in and wrote d/c order. CSW met with patient and talked extensively with daughters throughout the day- patient has been a resident of [facility] in the past and will only agree to be placed there. Spoke to [place]- Admissions at [facility]- they currently do not have a bed but hope to have one the first of next week. They will need to review patient's medical information and patient has an insurance that will require prior authorization. Discussed with patient and 2 daughters- due to d/c and no current bed at [facility]- they agreed to take patient home for the weekend and CSW has asked SNF to call daughters on Monday to offer bed when available. RNCM has arranged for HH, BSC, Wheelchair and walker.  Daughter- Larene Beach feels that both HH and DME will help them to manage patient at home and if she does well- they will not seek SNF. CSW will send referral to [facility] for review.  Patient and daughters are pleased with above plan.  Lorri Frederick. [place]  [phone]

## 2012-08-29 NOTE — Discharge Summary (Signed)
Discharge Summary   Patient ID: [name] [name] MRN: [id number], DOB/AGE: October 20, 1924 79 y.o. Admit date: 08/24/2012 D/C date:     08/29/2012  Primary Cardiologist: Clayson Riling  Primary Discharge Diagnoses:  1. Chest pain, non-anginal 2. Constipation 3. Urinary tract infection, completed treatment 4. Urinary rentention, for OP urology followup 5. Acute on chronic systolic CHF/ Ischemic cardiomyopathy LVEF 35-40% by echo 09/2011 6. CAD s/p CABG 2004, patent grafts by cath 02/2010 7. Chronic kidney disease stage 4 8. Anemia 9. HTN 10. Mildly elevated TSH - instructed to f/u PCP for recheck   Secondary Discharge Diagnoses:  1. HL 2. Carotid artery disease s/p R CEA 3. DJD 4. RLS 5. Heparin induced thrombocytopenia  6. Diptheria as a child 7. Pleural effusion, bilateral 2004 8. Pseudoaneurysm s/p repair following catheterization 1999 9. COPD 10. Atrial fib/flutter - s/p multiple DCCVs; amiodarone started 09/2011, TEE DCCV 3/13; amio and coumadin d/c'd after admxn to [facility] 02/2012 with frequent falls/syncope  11. Tachybrady syndrome - s/p [employer] pacemaker implant 09/2011 (8 sec pause noted)  12. GERD 13. Renal artery stenosis - bilateral- status post stenting 14. Depression 15. Gout  Hospital Course: HPI: 79 y/o female with a PMH of CAD, afib/flutter, pacemaker who presented 08/25/12 for chest pain evaluation. She has a PMH of CAD s/p CABG 2004. Her last cardiac cath 02/2010 showed 80% prox-LAD/95% mid-LAD, 40% prox-RCA stenosis; grafts showed patent LIMA-->LAD and patent SVG-->PDA. She also has ischemic cardiomyopathy with LVEF of 35-40% by transthoracic echocardiogram from 10/01/2011 and CKD (stage 4). About 3 days prior to presentation, she was seen by her PCP for a reported UTI which was originally treated with Cipro and subsequently changed to Nitrofurantoin since she could not tolerate Cipro. On the day of presentation, she started to complain of chest pain which she described as a  heaviness, like something was sitting on her chest. The chest pain was associated with shortness of breath, nausea/vomiting, but she denied radiating, diaphoresis, PND, orthopnea, palpitation or syncope. Her ECG showed atrial-ventricular paced rhythm with heart rate 75 bpm. Initial cardiac markers were negative. She was admitted to the hospital and started on Imdur in addition to her aspirin, statin and beta blocker. Troponins remained negative. Her chest pain was felt non-anginal. BP remained controlled on her regimen. Her BNP was elevated and she did have evidence of acute on chronic systolic CHF. However, due to urinary retention, foley was placed before IV Lasix could be started. Cr was observed closely with IV diuresis. Internal medicine assisted in the management of her UTI - she was treated with Zosyn to complete a 3 day course in-house (Urine culture done on 08/09/2012 grew ESBL. Repeat urine culture on 08/25/2012 showed no growth.) Note she is not on ACEI/ARB due to renal insufficiency.   She also complained of epigastric/abdominal pain and was diagnosed with constipation after KUB demonstrated large amount of stool in the colon. She was treated with dulcolax and tap water enema with large subsequent bowel movement and improvement in her symptoms. Her foley was able to be discontinued. Her diuretic had to be held temporarily due to increasing Cr, which has stabilized. Today, she is feeling well and is back to her baseline. Dr. Antoine Poche has recommended discharging her home on Lasix 40mg  daily which is lower than her previous home dose. We discussed her plan for discharge and he would like her to have home health PT arranged. She has very involved family. She will have a transition of care appt with BMET  within 1 week. I also requested referral to [facility] home health management (left a message on their voicemail). Dr. Antoine Poche has seen and examined the patient and feels she is stable for discharge. Per  discussion with Dr. Thedore Mins with internal medicine, she should 200mg  colace BID and PRN daily Miralax. He also recommended continuing Flomax daily with outpatient urology evaluation in 1-2 weeks - she will see Dr. Margarita Grizzle on 09/11/12. She was also instructed to f/u with PCP regarding her bowel regimen and also recheck of thyroid function since TSH was borderline elevated.  Discharge Vitals: Blood pressure 119/62, pulse 70, temperature 97.2 F (36.2 C), temperature source Oral, resp. rate 20, height 5\' 4"  (1.626 m), weight 114 lb 4.8 oz (51.846 kg), SpO2 100.00%.  Labs: Lab Results  Component Value Date   WBC 4.4 08/29/2012   HGB 8.8* 08/29/2012   HCT 28.1* 08/29/2012   MCV 89.8 08/29/2012   PLT 171 08/29/2012     Lab 08/29/12 0520 08/25/12 1932  NA 133* --  K 4.3 --  CL 96 --  CO2 28 --  BUN 36* --  CREATININE 1.95* --  CALCIUM 9.0 --  PROT -- 6.4  BILITOT -- 0.4  ALKPHOS -- 16*  ALT -- 11  AST -- 18  GLUCOSE 88 --    Lab Results  Component Value Date   CHOL 85 08/25/2012   HDL 27* 08/25/2012   LDLCALC 43 08/25/2012   TRIG 73 08/25/2012     Diagnostic Studies/Procedures   Dg Chest 2 View 08/27/2012  *RADIOLOGY REPORT*  Clinical Data: Decreased breath sounds on the left.  Short of breath.  CHEST - 2 VIEW  Comparison: 08/24/2012  Findings: Changes from prior CABG surgery are stable.  The cardiac silhouette is mildly enlarged.  No mediastinal or hilar mass or adenopathy is noted.  The left anterior chest wall sequential pacemaker is also stable and well positioned.  There are mild coarse reticular opacities in the lung bases, more prominent on the right, and lung apices, most consistent with chronic scarring.  There are no acute findings in the lungs.  No pleural effusion or pneumothorax.  Chronic fracture of the left proximal humerus is stable from the prior exam.  IMPRESSION: No acute cardiopulmonary disease.  No change from the recent prior study.   Original Report Authenticated By:  Amie Portland, M.D.    Dg Chest 2 View 08/24/2012  *RADIOLOGY REPORT*  Clinical Data: Chest pain.  CHEST - 2 VIEW  Comparison: 08/04/2012  Findings: Previous CABG.  Left subclavian pacemaker stable. Vascular clips in the right mid abdomen.  Stable cardiomegaly.  No effusion.  Prominent perihilar interstitial opacities as before. Old left humeral neck fracture.  IMPRESSION:  1.  Stable cardiomegaly and postop changes.  No acute disease.   Original Report Authenticated By: D. Andria Rhein, MD    Dg Abd 1 View 08/25/2012  *RADIOLOGY REPORT*  Clinical Data: Nausea and vomiting  ABDOMEN - 1 VIEW  Comparison: 06/08/2011  Findings: The stomach is gas filled and distended.  No disproportionate dilatation of small bowel.  Mild colonic distention.  Prominent stool burden in the descending and sigmoid colon.  No obvious free intraperitoneal gas.  Postoperative changes.  IMPRESSION: Gastric distention.  Prominent stool burden in the distal colon.   Original Report Authenticated By: Jolaine Click, M.D.    Discharge Medications   Current Discharge Medication List    START taking these medications   Details  docusate sodium (COLACE) 100 MG capsule  Take 2 capsules (200 mg total) by mouth 2 (two) times daily. Qty: 120 capsule, Refills: 0   Comments: Follow up with your primary care doctor for this medicine.    isosorbide mononitrate (IMDUR) 30 MG 24 hr tablet Take 1 tablet (30 mg total) by mouth daily. Qty: 30 tablet, Refills: 3    polyethylene glycol (MIRALAX / GLYCOLAX) packet Take 17 g by mouth daily as needed (for constipation - available over-the-counter).    Tamsulosin HCl (FLOMAX) 0.4 MG CAPS Take 1 capsule (0.4 mg total) by mouth daily. Qty: 30 capsule, Refills: 0   Comments: Follow up with your primary care doctor/urologist for this medicine.      CONTINUE these medications which have CHANGED   Details  furosemide (LASIX) 40 MG tablet Take 1 tablet (40 mg total) by mouth daily. Qty: 30 tablet,  Refills: 3      CONTINUE these medications which have NOT CHANGED   Details  ALPRAZolam (XANAX) 0.25 MG tablet Take 0.25 mg by mouth at bedtime.    amiodarone (PACERONE) 100 MG tablet Take 100 mg by mouth at bedtime.     aspirin EC 81 MG tablet Take 1 tablet (81 mg total) by mouth daily.   Associated Diagnoses: Acute on chronic systolic heart failure; Atrial fibrillation    carvedilol (COREG) 12.5 MG tablet Take 1 tablet (12.5 mg total) by mouth 2 (two) times daily with a meal.     Cyanocobalamin (VITAMIN B-12 IJ) Inject 1,000 mcg as directed See admin instructions. Every two weeks.    HYDROcodone-acetaminophen (NORCO) 10-325 MG per tablet Take 1 tablet by mouth every 4 (four) hours.     levothyroxine (SYNTHROID, LEVOTHROID) 50 MCG tablet Take 50 mcg by mouth at bedtime.     nitroGLYCERIN (NITROSTAT) 0.4 MG SL tablet Place 0.4 mg under the tongue every 5 (five) minutes as needed. For chest pain    omeprazole (PRILOSEC) 20 MG capsule Take 20 mg by mouth 3 (three) times daily.     rosuvastatin (CRESTOR) 5 MG tablet Take 5 mg by mouth every morning.     Vitamin D, Ergocalciferol, (DRISDOL) 50000 UNITS CAPS Take 50,000 Units by mouth every 7 (seven) days. Takes on Wed      STOP taking these medications     bisacodyl (DULCOLAX) 5 MG EC tablet Comments:  Reason for Stopping:       lubiprostone (AMITIZA) 24 MCG capsule Comments:  Reason for Stopping:       nitrofurantoin (MACRODANTIN) 100 MG capsule Comments:  Reason for Stopping:       potassium chloride SA (K-DUR,KLOR-CON) 20 MEQ tablet Comments:  Reason for Stopping:       aspirin 81 MG chewable tablet Comments:  Reason for Stopping:          Disposition   The patient will be discharged in stable condition to home. Discharge Orders    Future Appointments: Provider: Department: Dept Phone: Center:   09/04/2012 9:30 AM Ok Anis, NP [facility] Main Office [place]) [phone] [facility]   09/04/2012  10:00 AM [facility] Main Office [place]) [phone] [facility]   09/17/2012 2:00 PM Rollene Rotunda, MD [facility] [phone] [facility]     Future Orders Please Complete By Expires   Diet - low sodium heart healthy      Increase activity slowly      Discharge instructions      Comments:   For patients with congestive heart failure, we give them these special  instructions: 1. Follow a low-salt diet and watch your fluid intake. In general, you should not be taking in more than 2 liters of fluid per day. Some patients are restricted to less than 1.5 liters. This includes sources of water in foods like soup. 2. Weigh yourself on the same scale at same time of day and keep a log. 3. Call your doctor: (Anytime you feel any of the following symptoms)  - 3-4 pound weight gain in 1-2 days or 2 pounds overnight  - Shortness of breath, with or without a dry hacking cough  - Swelling in the hands, feet or stomach  - If you have to sleep on extra pillows at night in order to breathe     Follow-up Information    Follow up with Pamelia Hoit, MD. (To discuss further management of your constipation and bowel regimen, and to follow your general medical issues. Your thyroid function may also need to be rechecked.)    Contact information:   [address] [place] [address] [phone]       Follow up with Nicolasa Ducking, NP. (09/04/12 at 9:30am with labwork same day)    Contact information:   [facility] [address] [place] [address] [phone]       Follow up with Milford Cage, MD. (09/11/12 at 1:15pm - urologist new patient appointment for your urinary retention)    Contact information:   [facility] [address] [place] [address] [phone]            Duration of Discharge Encounter: Greater than 30 minutes including physician and PA time.  Signed, Ronie Spies  PA-C 08/29/2012, 1:18 PM  Patient seen and examined.  Plan as discussed in my rounding note for today and outlined above. [name] [name]  08/29/2012  1:25 PM

## 2012-08-29 NOTE — Progress Notes (Signed)
Pt d/c home w/son. D/c instructions and medications reviewed w/Pt and son. Pt and Son state understanding. All questions answered

## 2012-08-29 NOTE — Telephone Encounter (Signed)
No answer

## 2012-08-29 NOTE — Progress Notes (Signed)
PT Cancellation Note  Patient Details Name: [name] [name] MRN: [id number] DOB: Oct 26, 1924   Cancelled Treatment:    Reason Eval/Treat Not Completed: Other (comment) (Reports going home today.)  Discussed any discharge needs.  Reports MD said didn't need HHPT.  Briefly reviewed fall prevention information with patient.   Trysten Berti,CYNDI 08/29/2012, 9:59 AM Sheran Lawless, PT [phone] 08/29/2012

## 2012-08-29 NOTE — Progress Notes (Signed)
Note:DC summary already signed by MD - due to computer glitch, it lists on her AVS to both CONTINUE and DISCONTINUE aspirin (patient had a dose of 324mg  ASA documented by EMS that was discontinued at discharge, but home dose was continued). The patient and nurse were made aware that she should definitely CONTINUE aspirin. Dayna Dunn PA-C

## 2012-08-29 NOTE — Progress Notes (Signed)
SUBJECTIVE:  She feels well at is at baseline.  She is ready to go home.   PHYSICAL EXAM Filed Vitals:   08/28/12 1432 08/28/12 2103 08/29/12 0651 08/29/12 0919  BP: 121/64 132/65 127/71 119/62  Pulse: 69 70 71 70  Temp: 98.6 F (37 C) 97.2 F (36.2 C) 97.5 F (36.4 C) 97.2 F (36.2 C)  TempSrc: Oral Oral Oral Oral  Resp: 20 18 20 20   Height:      Weight:   114 lb 4.8 oz (51.846 kg)   SpO2: 100% 99% 100% 100%   General:  No acute distress but frail. Lungs:  Decreased breath sounds left lung unchanged Heart:  RRR Abdomen:  Positive bowel sounds, no rebound no guarding. Extremities:  No edema   LABS:  Results for orders placed during the hospital encounter of 08/24/12 (from the past 24 hour(s))  CBC     Status: Abnormal   Collection Time   08/29/12  5:20 AM      Component Value Range   WBC 4.4  4.0 - 10.5 K/uL   RBC 3.13 (*) 3.87 - 5.11 MIL/uL   Hemoglobin 8.8 (*) 12.0 - 15.0 g/dL   HCT 16.1 (*) 09.6 - 04.5 %   MCV 89.8  78.0 - 100.0 fL   MCH 28.1  26.0 - 34.0 pg   MCHC 31.3  30.0 - 36.0 g/dL   RDW 40.9 (*) 81.1 - 91.4 %   Platelets 171  150 - 400 K/uL  BASIC METABOLIC PANEL     Status: Abnormal   Collection Time   08/29/12  5:20 AM      Component Value Range   Sodium 133 (*) 135 - 145 mEq/L   Potassium 4.3  3.5 - 5.1 mEq/L   Chloride 96  96 - 112 mEq/L   CO2 28  19 - 32 mEq/L   Glucose, Bld 88  70 - 99 mg/dL   BUN 36 (*) 6 - 23 mg/dL   Creatinine, Ser 7.82 (*) 0.50 - 1.10 mg/dL   Calcium 9.0  8.4 - 95.6 mg/dL   GFR calc non Af Amer 22 (*) >90 mL/min   GFR calc Af Amer 26 (*) >90 mL/min    Intake/Output Summary (Last 24 hours) at 08/29/12 1000 Last data filed at 08/29/12 0400  Gross per 24 hour  Intake    480 ml  Output   1453 ml  Net   -973 ml     ASSESSMENT AND PLAN:  1. Chest Pain:  This is non anginal.  No further cardiac workup is suggested.  2. Ischemic Cardiomyopathy, LVEF 35-40% by echo 10/01/2011, NYHA class 4 :  Her creat is back down.   For now she can go home on 40 mg Lasix daily which is lower than her previous.  She needs a transition of care appt within the week with a BMET at that appt.   3. CAD s/p CABG in 2004 (grafts patent by cath 02/2010) :  As above  4. Chronic Kidney Disease (stage 4):  As above  5. Anemia:  No active sign of bleeding.  Hgb is stable.    6. UTI:  Completed antibiotics.  Foley out.  Now can be followed as an outpatient  7.  Constipation:  She feels much better after a large BM this admit.  I very much appreciate IM help. We will ask them for suggestions on a home bowel regimen.  [name] Asharia Lotter 08/29/2012 10:00 AM

## 2012-08-29 NOTE — Telephone Encounter (Signed)
New Problem:     I have scheduled a 7 day Transition of Care patient to be seen by Ward Givens on 09/04/12 @ 9:30am per Annabelle Harman PA.

## 2012-08-30 ENCOUNTER — Telehealth: Payer: Self-pay | Admitting: Physician Assistant

## 2012-08-30 ENCOUNTER — Inpatient Hospital Stay (HOSPITAL_COMMUNITY)
Admission: EM | Admit: 2012-08-30 | Discharge: 2012-09-05 | DRG: 292 | Disposition: A | Discharge status: Skilled Nursing Facility | Payer: Medicare Other | Attending: Cardiology | Admitting: Cardiology

## 2012-08-30 ENCOUNTER — Emergency Department (HOSPITAL_COMMUNITY): Payer: Medicare Other

## 2012-08-30 ENCOUNTER — Encounter (HOSPITAL_COMMUNITY): Payer: Self-pay | Admitting: Emergency Medicine

## 2012-08-30 DIAGNOSIS — I4892 Unspecified atrial flutter: Secondary | ICD-10-CM | POA: Diagnosis present

## 2012-08-30 DIAGNOSIS — M1990 Unspecified osteoarthritis, unspecified site: Secondary | ICD-10-CM | POA: Diagnosis present

## 2012-08-30 DIAGNOSIS — Z951 Presence of aortocoronary bypass graft: Secondary | ICD-10-CM

## 2012-08-30 DIAGNOSIS — I2510 Atherosclerotic heart disease of native coronary artery without angina pectoris: Secondary | ICD-10-CM | POA: Diagnosis present

## 2012-08-30 DIAGNOSIS — N184 Chronic kidney disease, stage 4 (severe): Secondary | ICD-10-CM | POA: Diagnosis present

## 2012-08-30 DIAGNOSIS — I252 Old myocardial infarction: Secondary | ICD-10-CM

## 2012-08-30 DIAGNOSIS — M67919 Unspecified disorder of synovium and tendon, unspecified shoulder: Secondary | ICD-10-CM | POA: Diagnosis present

## 2012-08-30 DIAGNOSIS — R0609 Other forms of dyspnea: Secondary | ICD-10-CM

## 2012-08-30 DIAGNOSIS — E039 Hypothyroidism, unspecified: Secondary | ICD-10-CM | POA: Diagnosis present

## 2012-08-30 DIAGNOSIS — K219 Gastro-esophageal reflux disease without esophagitis: Secondary | ICD-10-CM | POA: Diagnosis present

## 2012-08-30 DIAGNOSIS — Z888 Allergy status to other drugs, medicaments and biological substances status: Secondary | ICD-10-CM

## 2012-08-30 DIAGNOSIS — G2581 Restless legs syndrome: Secondary | ICD-10-CM | POA: Diagnosis present

## 2012-08-30 DIAGNOSIS — R339 Retention of urine, unspecified: Secondary | ICD-10-CM | POA: Diagnosis present

## 2012-08-30 DIAGNOSIS — Z91041 Radiographic dye allergy status: Secondary | ICD-10-CM

## 2012-08-30 DIAGNOSIS — M109 Gout, unspecified: Secondary | ICD-10-CM | POA: Diagnosis present

## 2012-08-30 DIAGNOSIS — F3289 Other specified depressive episodes: Secondary | ICD-10-CM | POA: Diagnosis present

## 2012-08-30 DIAGNOSIS — E785 Hyperlipidemia, unspecified: Secondary | ICD-10-CM | POA: Diagnosis present

## 2012-08-30 DIAGNOSIS — I779 Disorder of arteries and arterioles, unspecified: Secondary | ICD-10-CM | POA: Diagnosis present

## 2012-08-30 DIAGNOSIS — F419 Anxiety disorder, unspecified: Secondary | ICD-10-CM

## 2012-08-30 DIAGNOSIS — I4891 Unspecified atrial fibrillation: Secondary | ICD-10-CM | POA: Diagnosis present

## 2012-08-30 DIAGNOSIS — Z9089 Acquired absence of other organs: Secondary | ICD-10-CM

## 2012-08-30 DIAGNOSIS — Z7982 Long term (current) use of aspirin: Secondary | ICD-10-CM

## 2012-08-30 DIAGNOSIS — I129 Hypertensive chronic kidney disease with stage 1 through stage 4 chronic kidney disease, or unspecified chronic kidney disease: Secondary | ICD-10-CM | POA: Diagnosis present

## 2012-08-30 DIAGNOSIS — Z9861 Coronary angioplasty status: Secondary | ICD-10-CM

## 2012-08-30 DIAGNOSIS — Z79899 Other long term (current) drug therapy: Secondary | ICD-10-CM

## 2012-08-30 DIAGNOSIS — D519 Vitamin B12 deficiency anemia, unspecified: Secondary | ICD-10-CM | POA: Diagnosis present

## 2012-08-30 DIAGNOSIS — Z885 Allergy status to narcotic agent status: Secondary | ICD-10-CM

## 2012-08-30 DIAGNOSIS — G8929 Other chronic pain: Secondary | ICD-10-CM | POA: Diagnosis present

## 2012-08-30 DIAGNOSIS — J4489 Other specified chronic obstructive pulmonary disease: Secondary | ICD-10-CM | POA: Diagnosis present

## 2012-08-30 DIAGNOSIS — J449 Chronic obstructive pulmonary disease, unspecified: Secondary | ICD-10-CM | POA: Diagnosis present

## 2012-08-30 DIAGNOSIS — I509 Heart failure, unspecified: Secondary | ICD-10-CM | POA: Diagnosis present

## 2012-08-30 DIAGNOSIS — F411 Generalized anxiety disorder: Secondary | ICD-10-CM | POA: Diagnosis present

## 2012-08-30 DIAGNOSIS — R52 Pain, unspecified: Secondary | ICD-10-CM

## 2012-08-30 DIAGNOSIS — I5023 Acute on chronic systolic (congestive) heart failure: Principal | ICD-10-CM | POA: Diagnosis present

## 2012-08-30 DIAGNOSIS — K5900 Constipation, unspecified: Secondary | ICD-10-CM

## 2012-08-30 DIAGNOSIS — Z950 Presence of cardiac pacemaker: Secondary | ICD-10-CM

## 2012-08-30 DIAGNOSIS — I2589 Other forms of chronic ischemic heart disease: Secondary | ICD-10-CM | POA: Diagnosis present

## 2012-08-30 DIAGNOSIS — N183 Chronic kidney disease, stage 3 unspecified: Secondary | ICD-10-CM | POA: Diagnosis present

## 2012-08-30 DIAGNOSIS — Z66 Do not resuscitate: Secondary | ICD-10-CM | POA: Diagnosis present

## 2012-08-30 DIAGNOSIS — F329 Major depressive disorder, single episode, unspecified: Secondary | ICD-10-CM | POA: Diagnosis present

## 2012-08-30 DIAGNOSIS — R0600 Dyspnea, unspecified: Secondary | ICD-10-CM

## 2012-08-30 DIAGNOSIS — D518 Other vitamin B12 deficiency anemias: Secondary | ICD-10-CM | POA: Diagnosis present

## 2012-08-30 DIAGNOSIS — M719 Bursopathy, unspecified: Secondary | ICD-10-CM | POA: Diagnosis present

## 2012-08-30 LAB — COMPREHENSIVE METABOLIC PANEL
ALT: 7 U/L (ref 0–35)
AST: 13 U/L (ref 0–37)
Calcium: 9.2 mg/dL (ref 8.4–10.5)
Potassium: 4.2 mEq/L (ref 3.5–5.1)
Sodium: 130 mEq/L — ABNORMAL LOW (ref 135–145)
Total Protein: 7.3 g/dL (ref 6.0–8.3)

## 2012-08-30 LAB — CBC WITH DIFFERENTIAL/PLATELET
Basophils Absolute: 0 10*3/uL (ref 0.0–0.1)
Eosinophils Absolute: 0.1 10*3/uL (ref 0.0–0.7)
Eosinophils Relative: 1 % (ref 0–5)
Lymphocytes Relative: 15 % (ref 12–46)
MCH: 28.8 pg (ref 26.0–34.0)
MCV: 88.4 fL (ref 78.0–100.0)
Neutrophils Relative %: 71 % (ref 43–77)
Platelets: 196 10*3/uL (ref 150–400)
RDW: 17.4 % — ABNORMAL HIGH (ref 11.5–15.5)
WBC: 6 10*3/uL (ref 4.0–10.5)

## 2012-08-30 LAB — MRSA PCR SCREENING: MRSA by PCR: NEGATIVE

## 2012-08-30 MED ORDER — SODIUM CHLORIDE 0.9 % IV SOLN: 250.0000 mL | INTRAVENOUS | Status: DC | PRN

## 2012-08-30 MED ORDER — HYDROCODONE-ACETAMINOPHEN 10-325 MG PO TABS
1.0000 | ORAL_TABLET | ORAL | Status: DC
  Administered 2012-08-30 – 2012-09-04 (×25): 1 via ORAL
  Filled 2012-08-30 (×26): qty 1

## 2012-08-30 MED ORDER — ALUM & MAG HYDROXIDE-SIMETH 200-200-20 MG/5ML PO SUSP
30.0000 mL | ORAL | Status: DC | PRN
  Administered 2012-08-30: 30 mL via ORAL
  Filled 2012-08-30: qty 30

## 2012-08-30 MED ORDER — POLYETHYLENE GLYCOL 3350 17 G PO PACK
17.0000 g | PACK | Freq: Every day | ORAL | Status: DC | PRN
  Filled 2012-08-30: qty 1

## 2012-08-30 MED ORDER — TAMSULOSIN HCL 0.4 MG PO CAPS
0.4000 mg | ORAL_CAPSULE | Freq: Every day | ORAL | Status: DC
  Administered 2012-08-31 – 2012-09-05 (×6): 0.4 mg via ORAL
  Filled 2012-08-30 (×6): qty 1

## 2012-08-30 MED ORDER — AMIODARONE HCL 100 MG PO TABS
100.0000 mg | ORAL_TABLET | Freq: Every day | ORAL | Status: DC
Start: 2012-08-30 — End: 2012-09-05
  Administered 2012-08-30 – 2012-09-04 (×6): 100 mg via ORAL
  Filled 2012-08-30 (×7): qty 1

## 2012-08-30 MED ORDER — CARVEDILOL 12.5 MG PO TABS
12.5000 mg | ORAL_TABLET | Freq: Two times a day (BID) | ORAL | Status: DC
  Administered 2012-08-31 – 2012-09-05 (×11): 12.5 mg via ORAL
  Filled 2012-08-30 (×14): qty 1

## 2012-08-30 MED ORDER — ISOSORBIDE MONONITRATE ER 30 MG PO TB24
30.0000 mg | ORAL_TABLET | Freq: Every day | ORAL | Status: DC
  Administered 2012-08-31 – 2012-09-05 (×6): 30 mg via ORAL
  Filled 2012-08-30 (×7): qty 1

## 2012-08-30 MED ORDER — DOCUSATE SODIUM 100 MG PO CAPS
200.0000 mg | ORAL_CAPSULE | Freq: Two times a day (BID) | ORAL | Status: DC
  Administered 2012-08-30 – 2012-09-01 (×4): 200 mg via ORAL
  Filled 2012-08-30 (×5): qty 2

## 2012-08-30 MED ORDER — ACETAMINOPHEN 325 MG PO TABS: 650.0000 mg | ORAL_TABLET | ORAL | Status: DC | PRN

## 2012-08-30 MED ORDER — ENOXAPARIN SODIUM 30 MG/0.3ML ~~LOC~~ SOLN: 30.0000 mg | SUBCUTANEOUS | Status: DC

## 2012-08-30 MED ORDER — ONDANSETRON HCL 4 MG/2ML IJ SOLN: 4.0000 mg | Freq: Four times a day (QID) | INTRAMUSCULAR | Status: DC | PRN

## 2012-08-30 MED ORDER — VITAMIN D (ERGOCALCIFEROL) 1.25 MG (50000 UNIT) PO CAPS
50000.0000 [IU] | ORAL_CAPSULE | ORAL | Status: DC
  Administered 2012-09-03: 50000 [IU] via ORAL
  Filled 2012-08-30: qty 1

## 2012-08-30 MED ORDER — NITROGLYCERIN 0.4 MG SL SUBL: 0.4000 mg | SUBLINGUAL_TABLET | SUBLINGUAL | Status: DC | PRN

## 2012-08-30 MED ORDER — ASPIRIN EC 81 MG PO TBEC
81.0000 mg | DELAYED_RELEASE_TABLET | Freq: Every day | ORAL | Status: DC
  Administered 2012-08-31 – 2012-09-05 (×6): 81 mg via ORAL
  Filled 2012-08-30 (×6): qty 1

## 2012-08-30 MED ORDER — ATORVASTATIN CALCIUM 10 MG PO TABS
10.0000 mg | ORAL_TABLET | Freq: Every day | ORAL | Status: DC
  Administered 2012-08-31 – 2012-09-04 (×5): 10 mg via ORAL
  Filled 2012-08-30 (×7): qty 1

## 2012-08-30 MED ORDER — SODIUM CHLORIDE 0.9 % IJ SOLN
3.0000 mL | Freq: Two times a day (BID) | INTRAMUSCULAR | Status: DC
  Administered 2012-08-30 – 2012-09-05 (×10): 3 mL via INTRAVENOUS

## 2012-08-30 MED ORDER — SODIUM CHLORIDE 0.9 % IV SOLN
INTRAVENOUS | Status: DC
  Administered 2012-08-30: 14:00:00 via INTRAVENOUS

## 2012-08-30 MED ORDER — ALPRAZOLAM 0.25 MG PO TABS
0.2500 mg | ORAL_TABLET | Freq: Two times a day (BID) | ORAL | Status: DC | PRN
  Filled 2012-08-30 (×2): qty 1

## 2012-08-30 MED ORDER — FUROSEMIDE 10 MG/ML IJ SOLN
40.0000 mg | Freq: Two times a day (BID) | INTRAMUSCULAR | Status: DC
  Administered 2012-08-30 – 2012-09-02 (×7): 40 mg via INTRAVENOUS
  Filled 2012-08-30 (×10): qty 4

## 2012-08-30 MED ORDER — FUROSEMIDE 10 MG/ML IJ SOLN
60.0000 mg | Freq: Once | INTRAMUSCULAR | Status: AC
  Administered 2012-08-30: 60 mg via INTRAVENOUS
  Filled 2012-08-30: qty 6

## 2012-08-30 MED ORDER — ALPRAZOLAM 0.25 MG PO TABS
0.2500 mg | ORAL_TABLET | Freq: Every day | ORAL | Status: DC
  Administered 2012-08-30 – 2012-08-31 (×2): 0.25 mg via ORAL

## 2012-08-30 MED ORDER — LEVOTHYROXINE SODIUM 50 MCG PO TABS
50.0000 ug | ORAL_TABLET | Freq: Every day | ORAL | Status: DC
  Administered 2012-08-30 – 2012-09-04 (×6): 50 ug via ORAL
  Filled 2012-08-30 (×7): qty 1

## 2012-08-30 MED ORDER — PANTOPRAZOLE SODIUM 40 MG PO TBEC
40.0000 mg | DELAYED_RELEASE_TABLET | Freq: Every day | ORAL | Status: DC
  Administered 2012-08-31 – 2012-09-05 (×6): 40 mg via ORAL
  Filled 2012-08-30 (×6): qty 1

## 2012-08-30 MED ORDER — ZOLPIDEM TARTRATE 5 MG PO TABS: 5.0000 mg | ORAL_TABLET | Freq: Every evening | ORAL | Status: DC | PRN

## 2012-08-30 MED ORDER — SODIUM CHLORIDE 0.9 % IJ SOLN: 3.0000 mL | INTRAMUSCULAR | Status: DC | PRN

## 2012-08-30 NOTE — H&P (Signed)
History and Physical   Patient ID: [name] [name] MRN: [id number], DOB/AGE: 02-01-1925 79 y.o. Date of Encounter: 08/30/2012  Primary Physician: Pamelia Hoit, MD Primary Cardiologist: [facility]  Chief Complaint:  Acute on chronic systolic CHF  HPI:  [name] [name] is an 79 year old female with a history of heart failure. She has been hospitalized several times in the last few months. The last admission was from 08/24/2012 through 08/29/2012. She was discharged yesterday and went home with one of her daughters. Last p.m., she became more short of breath. she describes orthopnea and PND.she sat up all might and was not able to get any rest because of her shortness of breath. This a.m., the daughters were concerned and called the office. They brought her to the emergency room by EMS as requested. She has not had chest pain. Currently she is slightly short of breath, sitting upright and with oxygen. She has taken some of her morning medications, this is being clarified by pharmacy.  Past Medical History  Diagnosis Date  . Coronary artery disease     a. LAD stent 1999 after NSTEMI. b. CABG 2004. c. Last LHC 02/2010 - patent grafts.  . Hypertension   . Hyperlipidemia   . Carotid artery disease     status post right carotid endarterectomy.  . Degenerative joint disease   . Restless leg syndrome   . Chronic renal insufficiency   . Heparin induced thrombocytopenia   . Diphtheria     "as a child"  . Pleural effusion, bilateral 06/2003  . Pseudoaneurysm     status post repair following catherization in 1999  . Chronic systolic heart failure 10/02/11  . Ischemic cardiomyopathy     Echocardiogram 10/01/11: Mild LVH, EF 35-40%, basal inferior, basal to mid posterior and basal to mid anterolateral severe hypokinesis, mild to moderate AI, moderate MR (ischemic MR), moderate LAE, mild RVE, mildly reduced RV systolic function, mild RAE, PASP 43-44  . COPD (chronic obstructive pulmonary disease)   . Atrial  fibrillation/flutter     s/p multiple DCCVs;  amiodarone started 09/2011, TEE DCCV 3/13;  amio and coumadin d/c'd after admxn to [facility] 02/2012 with frequent falls/syncope  . Tachycardia-bradycardia syndrome     s/p Medtronic pacemaker implant 09/2011 (8 sec pause noted)  . GERD (gastroesophageal reflux disease)   . Renal artery stenosis     bilateral- status post stenting  . Non-functioning kidney   . Depression     "cries alot"  . Gout   . Hypothyroidism   . Anemia      Surgical History:  Past Surgical History  Procedure Date  . Carotid endarterectomy 05/2003    right  . Pacemaker insertion 10/02/11    implanted by Dr Johney Frame  . Cholecystectomy ?02/2011  . Tonsillectomy and adenoidectomy   . Renal artery stent 06/2003    bilaterally/e-chart  . Thoracentesis ? 2000; 05/2011  . Coronary angioplasty with stent placement 1999  . Av fistula repair 01/1998    w/pseudoaneurysm repair S/P catheterization/E-chart  . Coronary artery bypass graft 05/2003    CABG X 3  . Tee without cardioversion 10/23/2011    Procedure: TRANSESOPHAGEAL ECHOCARDIOGRAM (TEE);  Surgeon: Wendall Stade, MD;  Location: [facility] ENDOSCOPY;  Service: Cardiovascular;  Laterality: N/A;  . Cardioversion 10/23/2011    Procedure: CARDIOVERSION;  Surgeon: Wendall Stade, MD;  Location: [facility] ENDOSCOPY;  Service: Cardiovascular;  Laterality: N/A;     I have reviewed the patient's current medications. Prior to Admission medications  Medication Sig Start Date End Date Taking? Authorizing Provider  ALPRAZolam (XANAX) 0.25 MG tablet Take 0.25 mg by mouth at bedtime. 02/21/12  Yes Erick Blinks, MD  amiodarone (PACERONE) 100 MG tablet Take 100 mg by mouth at bedtime.    Yes Historical Provider, MD  aspirin EC 81 MG tablet Take 1 tablet (81 mg total) by mouth daily. 03/07/12  Yes Beatrice Lecher, PA  carvedilol (COREG) 12.5 MG tablet Take 1 tablet (12.5 mg total) by mouth 2 (two) times daily with a meal. 01/17/12 01/16/13 Yes Hillis Range, MD    Cyanocobalamin (VITAMIN B-12 IJ) Inject 1,000 mcg as directed every 14 (fourteen) days. Administered at MD office   Yes Historical Provider, MD  docusate sodium (COLACE) 100 MG capsule Take 2 capsules (200 mg total) by mouth 2 (two) times daily. 08/29/12  Yes Dayna N Dunn, PA  furosemide (LASIX) 40 MG tablet Take 1 tablet (40 mg total) by mouth daily. 08/29/12  Yes Dayna N Dunn, PA  HYDROcodone-acetaminophen (NORCO) 10-325 MG per tablet Take 1 tablet by mouth every 4 (four) hours.    Yes Historical Provider, MD  isosorbide mononitrate (IMDUR) 30 MG 24 hr tablet Take 1 tablet (30 mg total) by mouth daily. 08/29/12  Yes Dayna N Dunn, PA  levothyroxine (SYNTHROID, LEVOTHROID) 50 MCG tablet Take 50 mcg by mouth at bedtime.    Yes Historical Provider, MD  nitroGLYCERIN (NITROSTAT) 0.4 MG SL tablet Place 0.4 mg under the tongue every 5 (five) minutes as needed. For chest pain   Yes Historical Provider, MD  omeprazole (PRILOSEC) 20 MG capsule Take 20 mg by mouth 3 (three) times daily.    Yes Historical Provider, MD  polyethylene glycol (MIRALAX / GLYCOLAX) packet Take 17 g by mouth daily as needed (for constipation - available over-the-counter). 08/29/12  Yes Dayna N Dunn, PA  rosuvastatin (CRESTOR) 5 MG tablet Take 5 mg by mouth every morning.    Yes Historical Provider, MD  Tamsulosin HCl (FLOMAX) 0.4 MG CAPS Take 1 capsule (0.4 mg total) by mouth daily. 08/29/12  Yes Dayna N Dunn, PA  Vitamin D, Ergocalciferol, (DRISDOL) 50000 UNITS CAPS Take 50,000 Units by mouth every 7 (seven) days. Takes on Wed   Yes Historical Provider, MD    Scheduled Meds:    . furosemide  60 mg Intravenous Once   Continuous Infusions:    . sodium chloride 20 mL/hr at 08/30/12 1343   PRN Meds:.  Allergies:  Allergies  Allergen Reactions  . Altace (Ramipril) Hives  . Contrast Media (Iodinated Diagnostic Agents) Other (See Comments)    Reaction noted by md-unknown reaction per family  . Heparin Other (See Comments)     Clots blood instead of thinning  . Hydromorphone Hcl Other (See Comments)    Crazy feeling    History   Social History  . Marital Status: Widowed    Spouse Name: N/A    Number of Children: N/A  . Years of Education: N/A   Occupational History  . retired Health and safety inspector    Social History Main Topics  . Smoking status: Never Smoker   . Smokeless tobacco: Never Used  . Alcohol Use: No  . Drug Use: No  . Sexually Active: No   Other Topics Concern  . Not on file   Social History Narrative  . Currently, she is alternating 2 weeks with one daughter in 2 weeks with the other daughter.    Family History  Problem Relation Age of Onset  .  Cancer Mother 81    died  . Lung disease Father 49    died  . Heart disease Sister     2 sisters and her son  . Anesthesia problems Neg Hx    Family Status  Relation Status Death Age  . Mother Deceased   . Father Deceased     Review of Systems:   Full 14-point review of systems otherwise negative except as noted above.   Physical Exam: Blood pressure 123/67, pulse 75, temperature 98.2 F (36.8 C), temperature source Oral, resp. rate 18, SpO2 99.00%. General: Well developed, well nourished, elderly female in moderate distress. Head: Normocephalic, atraumatic, sclera non-icteric, no xanthomas, nares are without discharge. Dentition: poor Neck: No carotid bruits. JVD elevated at 10-12 cm. No thyromegally Lungs: Good expansion bilaterally. without wheezes or rhonchi. Decreased breath sounds in both bases with some crackles and rales Heart: Regular rate and rhythm with S1 S2.  No S3 or S4.  2/6 murmur, no rubs, or gallops appreciated. Abdomen: Soft, non-tender, non-distended with normoactive bowel sounds. No hepatomegaly. No rebound/guarding. No obvious abdominal masses. Msk:  Strength and tone appear weak for age. No joint deformities or effusions, no spine or costo-vertebral angle tenderness. Extremities: No clubbing or cyanosis. No  edema.  Distal pedal pulses are 1-2+ in 4 extrem Neuro: Alert and oriented X 3. Moves all extremities spontaneously. No focal deficits noted. Psych:  Responds to questions appropriately with a normal affect. Skin: multiple areas of ecchymosis from previous hospitalizations, IVs and blood draws. She has a skin tear on her left forearm that is bandaged and dressed and was not disturbed.  Labs:   Lab Results  Component Value Date   WBC 6.0 08/30/2012   HGB 10.2* 08/30/2012   HCT 31.3* 08/30/2012   MCV 88.4 08/30/2012   PLT 196 08/30/2012   No results found for this basename: INR in the last 72 hours   Lab 08/29/12 0520 08/25/12 1932  NA 133* --  K 4.3 --  CL 96 --  CO2 28 --  BUN 36* --  CREATININE 1.95* --  CALCIUM 9.0 --  PROT -- 6.4  BILITOT -- 0.4  ALKPHOS -- 16*  ALT -- 11  AST -- 18  GLUCOSE 88 --   No results found for this basename: CKTOTAL:4,CKMB:4,TROPONINI:4 in the last 72 hours  Pro B Natriuretic peptide (BNP)  Date/Time Value Range Status  08/25/2012  1:11 AM 34554.0* 0 - 450 pg/mL Final  08/04/2012  1:32 AM 8068.0* 0 - 450 pg/mL Final    Radiology/Studies:  Dg Chest 2 View 08/27/2012  *RADIOLOGY REPORT*  Clinical Data: Decreased breath sounds on the left.  Short of breath.  CHEST - 2 VIEW  Comparison: 08/24/2012  Findings: Changes from prior CABG surgery are stable.  The cardiac silhouette is mildly enlarged.  No mediastinal or hilar mass or adenopathy is noted.  The left anterior chest Masyn Fullam sequential pacemaker is also stable and well positioned.  There are mild coarse reticular opacities in the lung bases, more prominent on the right, and lung apices, most consistent with chronic scarring.  There are no acute findings in the lungs.  No pleural effusion or pneumothorax.  Chronic fracture of the left proximal humerus is stable from the prior exam.  IMPRESSION: No acute cardiopulmonary disease.  No change from the recent prior study.   Original Report Authenticated By:  Amie Portland, M.D.    Echo: 10/01/2011 - Left ventricle: The cavity size was normal. Mylissa Lambe thickness  was increased in a pattern of mild LVH. Systolic function was moderately reduced. The estimated ejection fraction was in the range of 35% to 40%. Basal inferior, basal to mid posterior, and basal to mid anterolateral severe hypokinesis. - Aortic valve: There was no stenosis. Mild to moderate regurgitation. - Mitral valve: Moderate regurgitation. Posterior leaflet restriction by lateral Tynlee Bayle motion abnormality (ischemic MR). - Left atrium: The atrium was moderately dilated. - Right ventricle: The cavity size was mildly dilated. Systolic function was mildly reduced. - Right atrium: The atrium was mildly dilated. - Tricuspid valve: Moderate regurgitation. Peak RV-RA gradient: 37mm Hg (S). - Pulmonary arteries: PA systolic pressure 43-47 mmHg. - Systemic veins: IVC measured 1.9 cm with normal respirophasic variation, suggesting RA pressure 6-10 mmHg. Impressions: - Normal LV size with mild LV hypertrophy. EF 35-40% with Maridel Pixler motion abnormalities as noted above. Moderate MR, suspect ischemic MR related to lateral Holdyn Poyser motion abnormality with restriction of the posterior leaflet. Mildly dilated RV with mildly depressed systolic function. Mild pulmonary hypertension.   ECG:  30-Aug-2012 13:02:45  See preliminary report below, believe this is A-V Pacing. SINUS RHYTHM ~ normal P axis, V-rate 50- 99 LVH WITH IVCD, LAD AND SECONDARY REPOL ABNRM ~ RISIII/S12R56, wQRS, LAD, rep abn Standard 12 Lead Report ~ Unconfirmed Interpretation Abnormal ECG 75mm/s 10mm/mV 150Hz  8.0.1 12SL 235 CID: [id number] Referred by: Unconfirmed Vent. rate 73 BPM PR interval 204 ms QRS duration 192 ms QT/QTc 516/569 ms P-R-T axes 74 -76 98  ASSESSMENT AND PLAN:  Principal Problem:  *Acute on chronic systolic CHF (congestive heart failure), NYHA class 4 - admit, cycle enzymes but doubt a primary cardiac event.  Will diuresis with IV Lasix. The patient has been out-of-hospital DO NOT RESUSCITATE form. This was reviewed with her family who agreed as appropriate. Brought up hospice with them and they will consider this as her time between hospitalizations is very short. They also admitted there on able to give her the care she needs at home and will get a social work consult for possible placement issues on Monday. Additionally, will get a wound care consult for skin tear on her left arm.  Otherwise, continue home medications and follow renal function closely. Active Problems:  CKD (chronic kidney disease), stage IV  DNR (do not resuscitate)  Anemia, B12 deficiency  Hypothyroidism   Signed,  Rhonda Barrett PA-C 08/30/2012, 2:18 PM  I have taken a history, reviewed medications, allergies, PMH, SH, FH, and reviewed ROS and examined the patient.  I agree with the assessment and plan. Patient condition very fragile with necessity for readmission within one day. Her last admission was for noncardiac chest pain. However she has long-standing heart failure and is a DO NOT RESUSCITATE. I've encouraged the family and the patient to except palliative care which can also be a significant help at home. Daughters are very attentive and share the care of their mother. Poor prognosis.  Elsbeth Yearick C. Daleen Squibb, MD, [facility] Pager:  [phone]

## 2012-08-30 NOTE — ED Notes (Signed)
Per EMS pt came from home c/o SOB. Lung sounds were diminished in lower fields. Pt also c/o nausea. EMS said pt is on concentrated RA at home and SpO2 was 92%. EMS put pt on 2L [place] and pt SpO2 98%. Pt has hx of CHF, pt has pacemaker. Pt was released from the hospital yesterday for UTI and urinary retention.

## 2012-08-30 NOTE — Telephone Encounter (Addendum)
Pt dtr called because fluid is accumulating again and pt cannot breathe well, even upright. Discussed situation briefly, daughter is not sure whether or not pt is a full code but is aware she does not want a long-term feeding tube. Advised dtr to call 911 and have her brought to [facility] ER. Dtr said would do so.  Addendum: Obtain more information and patient has an out-of-hospital DO NOT RESUSCITATE form signed by Dr. Antoine Poche. Discussed this with the patient's 2 daughters. They agree that this is appropriate.

## 2012-08-30 NOTE — ED Notes (Signed)
Pt transported to radiology.

## 2012-08-30 NOTE — ED Provider Notes (Signed)
History     CSN: [id number]  Arrival date & time 08/30/12  1249   First MD Initiated Contact with Patient 08/30/12 1303      Chief Complaint  Patient presents with  . Shortness of Breath    (Consider location/radiation/quality/duration/timing/severity/associated sxs/prior treatment) Patient is a 79 y.o. female presenting with shortness of breath. The history is provided by the patient and a relative.  Shortness of Breath  Associated symptoms include shortness of breath.   patient here with choice of breath started yesterday similar to her CHF. Was discharged from the hospital yesterday after admission for CHF as well as UTI according to the old records which are reviewed. Denies any chest pain or chest pressure. Does note orthopnea. No lower extremity edema. Has been compliant with her medications. No fever chills and cough has been nonproductive.  Past Medical History  Diagnosis Date  . Coronary artery disease     a. LAD stent 1999 after NSTEMI. b. CABG 2004. c. Last LHC 02/2010 - patent grafts.  . Hypertension   . Hyperlipidemia   . Carotid artery disease     status post right carotid endarterectomy.  . Degenerative joint disease   . Restless leg syndrome   . Chronic renal insufficiency   . Heparin induced thrombocytopenia   . Diphtheria     "as a child"  . Pleural effusion, bilateral 06/2003  . Pseudoaneurysm     status post repair following catherization in 1999  . Chronic systolic heart failure 10/02/11  . Ischemic cardiomyopathy     Echocardiogram 10/01/11: Mild LVH, EF 35-40%, basal inferior, basal to mid posterior and basal to mid anterolateral severe hypokinesis, mild to moderate AI, moderate MR (ischemic MR), moderate LAE, mild RVE, mildly reduced RV systolic function, mild RAE, PASP 43-44  . COPD (chronic obstructive pulmonary disease)   . Atrial fibrillation/flutter     s/p multiple DCCVs;  amiodarone started 09/2011, TEE DCCV 3/13;  amio and coumadin d/c'd after  admxn to [facility] 02/2012 with frequent falls/syncope  . Tachycardia-bradycardia syndrome     s/p Medtronic pacemaker implant 09/2011 (8 sec pause noted)  . GERD (gastroesophageal reflux disease)   . Renal artery stenosis     bilateral- status post stenting  . Non-functioning kidney   . Depression     "cries alot"  . Gout   . Hypothyroidism   . Anemia     Past Surgical History  Procedure Date  . Carotid endarterectomy 05/2003    right  . Pacemaker insertion 10/02/11    implanted by Dr Johney Frame  . Cholecystectomy ?02/2011  . Tonsillectomy and adenoidectomy   . Renal artery stent 06/2003    bilaterally/e-chart  . Thoracentesis ? 2000; 05/2011  . Coronary angioplasty with stent placement 1999  . Av fistula repair 01/1998    w/pseudoaneurysm repair S/P catheterization/E-chart  . Coronary artery bypass graft 05/2003    CABG X 3  . Tee without cardioversion 10/23/2011    Procedure: TRANSESOPHAGEAL ECHOCARDIOGRAM (TEE);  Surgeon: Wendall Stade, MD;  Location: [facility] ENDOSCOPY;  Service: Cardiovascular;  Laterality: N/A;  . Cardioversion 10/23/2011    Procedure: CARDIOVERSION;  Surgeon: Wendall Stade, MD;  Location: [facility] ENDOSCOPY;  Service: Cardiovascular;  Laterality: N/A;    Family History  Problem Relation Age of Onset  . Cancer Mother 56    died  . Lung disease Father 49    died  . Heart disease Sister     2 sisters and her son  .  Anesthesia problems Neg Hx     History  Substance Use Topics  . Smoking status: Never Smoker   . Smokeless tobacco: Never Used  . Alcohol Use: No    OB History    Grav Para Term Preterm Abortions TAB SAB Ect Mult Living                  Review of Systems  Respiratory: Positive for shortness of breath.   All other systems reviewed and are negative.    Allergies  Altace; Contrast media; Heparin; and Hydromorphone hcl  Home Medications   Current Outpatient Rx  Name  Route  Sig  Dispense  Refill  . ALPRAZOLAM 0.25 MG PO TABS   Oral   Take  0.25 mg by mouth at bedtime.         . AMIODARONE HCL 100 MG PO TABS   Oral   Take 100 mg by mouth at bedtime.          . ASPIRIN EC 81 MG PO TBEC   Oral   Take 1 tablet (81 mg total) by mouth daily.         Marland Kitchen CARVEDILOL 12.5 MG PO TABS   Oral   Take 1 tablet (12.5 mg total) by mouth 2 (two) times daily with a meal.   120 tablet   3   . VITAMIN B-12 IJ   Injection   Inject 1,000 mcg as directed See admin instructions. Every two weeks.         . DOCUSATE SODIUM 100 MG PO CAPS   Oral   Take 2 capsules (200 mg total) by mouth 2 (two) times daily.   120 capsule   0     Follow up with your primary care doctor for this m ...   . FUROSEMIDE 40 MG PO TABS   Oral   Take 1 tablet (40 mg total) by mouth daily.   30 tablet   3   . HYDROCODONE-ACETAMINOPHEN 10-325 MG PO TABS   Oral   Take 1 tablet by mouth every 4 (four) hours.          . ISOSORBIDE MONONITRATE ER 30 MG PO TB24   Oral   Take 1 tablet (30 mg total) by mouth daily.   30 tablet   3   . LEVOTHYROXINE SODIUM 50 MCG PO TABS   Oral   Take 50 mcg by mouth at bedtime.          Marland Kitchen NITROGLYCERIN 0.4 MG SL SUBL   Sublingual   Place 0.4 mg under the tongue every 5 (five) minutes as needed. For chest pain         . OMEPRAZOLE 20 MG PO CPDR   Oral   Take 20 mg by mouth 3 (three) times daily.          Marland Kitchen POLYETHYLENE GLYCOL 3350 PO PACK   Oral   Take 17 g by mouth daily as needed (for constipation - available over-the-counter).         . ROSUVASTATIN CALCIUM 5 MG PO TABS   Oral   Take 5 mg by mouth every morning.          Marland Kitchen TAMSULOSIN HCL 0.4 MG PO CAPS   Oral   Take 1 capsule (0.4 mg total) by mouth daily.   30 capsule   0     Follow up with your primary care doctor/urologist  ...   . VITAMIN D (ERGOCALCIFEROL) 50000 UNITS  PO CAPS   Oral   Take 50,000 Units by mouth every 7 (seven) days. Takes on Wed           BP 123/67  Pulse 75  Temp 98.2 F (36.8 C) (Oral)  Resp 18  SpO2  99%  Physical Exam  Nursing note and vitals reviewed. Constitutional: She is oriented to person, place, and time. She appears well-developed and well-nourished.  Non-toxic appearance. No distress.  HENT:  Head: Normocephalic and atraumatic.  Eyes: Conjunctivae normal, EOM and lids are normal. Pupils are equal, round, and reactive to light.  Neck: Normal range of motion. Neck supple. No tracheal deviation present. No mass present.  Cardiovascular: Normal rate, regular rhythm and normal heart sounds.  Exam reveals no gallop.   No murmur heard. Pulmonary/Chest: Effort normal. No stridor. No respiratory distress. She has decreased breath sounds. She has no wheezes. She has rhonchi. She has no rales.  Abdominal: Soft. Normal appearance and bowel sounds are normal. She exhibits no distension. There is no tenderness. There is no rebound and no CVA tenderness.  Musculoskeletal: Normal range of motion. She exhibits no edema and no tenderness.  Neurological: She is alert and oriented to person, place, and time. She has normal strength. No cranial nerve deficit or sensory deficit. GCS eye subscore is 4. GCS verbal subscore is 5. GCS motor subscore is 6.  Skin: Skin is warm and dry. No abrasion and no rash noted.  Psychiatric: She has a normal mood and affect. Her speech is normal and behavior is normal.    ED Course  Procedures (including critical care time)   Labs Reviewed  PRO B NATRIURETIC PEPTIDE  CBC WITH DIFFERENTIAL  COMPREHENSIVE METABOLIC PANEL  URINALYSIS, ROUTINE W REFLEX MICROSCOPIC  URINE CULTURE   No results found.   No diagnosis found.    MDM   Date: 08/30/2012  Rate:73  Rhythm: normal sinus rhythm  QRS Axis: normal  Intervals: normal  ST/T Wave abnormalities: nonspecific ST changes  Conduction Disutrbances:paced rythm  Narrative Interpretation:   Old EKG Reviewed: unchanged  Patient with likely exacerbation of her heart failure. Spoke with cardiology and they will  come and see her. We'll initiate Lasix treatment here          Toy Baker, MD 08/30/12 1315

## 2012-08-31 LAB — URINALYSIS, ROUTINE W REFLEX MICROSCOPIC
Bilirubin Urine: NEGATIVE
Glucose, UA: NEGATIVE mg/dL
Ketones, ur: NEGATIVE mg/dL
pH: 6 (ref 5.0–8.0)

## 2012-08-31 LAB — BASIC METABOLIC PANEL
BUN: 37 mg/dL — ABNORMAL HIGH (ref 6–23)
CO2: 29 mEq/L (ref 19–32)
Calcium: 9.2 mg/dL (ref 8.4–10.5)
Creatinine, Ser: 1.86 mg/dL — ABNORMAL HIGH (ref 0.50–1.10)

## 2012-08-31 LAB — TROPONIN I: Troponin I: <0.3 ng/mL (ref <0.30)

## 2012-08-31 LAB — URINE MICROSCOPIC-ADD ON

## 2012-08-31 NOTE — Progress Notes (Signed)
Patient Name: [name] [name] Date of Encounter: 08/31/2012  Principal Problem:  *Acute on chronic systolic CHF (congestive heart failure), NYHA class 4 Active Problems:  CKD (chronic kidney disease), stage IV  DNR (do not resuscitate)  Anemia, B12 deficiency  Hypothyroidism    SUBJECTIVE: Breathing better, no chest pain, still feels like she has some fluid.  OBJECTIVE Filed Vitals:   08/31/12 0500 08/31/12 0700 08/31/12 0753 08/31/12 1141  BP:  117/60  115/63  Pulse:  70    Temp:   97.6 F (36.4 C) 98.3 F (36.8 C)  TempSrc:   Oral Oral  Resp:  25    Height:      Weight: 115 lb 15.4 oz (52.6 kg)     SpO2:  97%      Intake/Output Summary (Last 24 hours) at 08/31/12 1159 Last data filed at 08/31/12 0800  Gross per 24 hour  Intake    350 ml  Output   1975 ml  Net  -1625 ml   Filed Weights   08/30/12 1900 08/31/12 0500  Weight: 113 lb 8.6 oz (51.5 kg) 115 lb 15.4 oz (52.6 kg)   PHYSICAL EXAM General: Well developed, well nourished, elderly female in no acute distress. Head: Normocephalic, atraumatic.  Neck: Supple without bruits, JVD at 10 cm. Lungs:  Resp regular and unlabored, crackles bases (fewer than yest). Heart: RRR, S1, S2, no S3, S4, or murmur. Abdomen: Soft, non-tender, non-distended, BS + x 4.  Extremities: No clubbing, cyanosis, no edema.  Neuro: Alert and oriented X 3. Moves all extremities spontaneously. Psych: Normal affect.  LABS: CBC: Basename 08/30/12 1335 08/29/12 0520  WBC 6.0 4.4  NEUTROABS 4.2 --  HGB 10.2* 8.8*  HCT 31.3* 28.1*  MCV 88.4 89.8  PLT 196 171   INR:No results found for this basename: INR in the last 72 hours Basic Metabolic Panel: Basename 08/31/12 0844 08/30/12 1335  NA 133* 130*  K 4.7 4.2  CL 95* 91*  CO2 29 25  GLUCOSE 97 115*  BUN 37* 35*  CREATININE 1.86* 1.77*  CALCIUM 9.2 9.2  MG -- --  PHOS -- --   Liver Function Tests: Basename 08/30/12 1335  AST 13  ALT 7  ALKPHOS 64  BILITOT 0.5  PROT 7.3   ALBUMIN 3.4*   Cardiac Enzymes: Basename 08/31/12 0359 08/30/12 1950 08/30/12 1437  CKTOTAL -- -- --  CKMB -- -- --  CKMBINDEX -- -- --  TROPONINI <0.30 <0.30 <0.30    BNP: Pro B Natriuretic peptide (BNP)  Date/Time Value Range Status  08/30/2012  1:23 PM 19433.0* 0 - 450 pg/mL Final  08/25/2012  1:11 AM 34554.0* 0 - 450 pg/mL Final    TELE: AV Pacing       Radiology/Studies: Dg Chest 2 View 08/30/2012  *RADIOLOGY REPORT*  Clinical Data: Chest pain in the epigastric region  CHEST - 2 VIEW  Comparison: Chest radiograph 08/24/2012 and 08/27/2012  Findings: Patient is rotated to the right.  Chronic moderate cardiomegaly is stable.  Prior CABG.  Left chest Briele Lagasse dual lead pacer is present.  Pulmonary vascularity appears within normal limits.  No airspace disease, effusion, or pneumothorax is identified.  The bones are diffusely osteopenic.  Remote left humeral neck fracture appears unchanged.  IMPRESSION: Stable cardiomegaly.  No acute cardiopulmonary disease identified.   Original Report Authenticated By: Britta Mccreedy, M.D.    Dg Chest 2 View 08/27/2012  *RADIOLOGY REPORT*  Clinical Data: Decreased breath sounds on the left.  Short of breath.  CHEST - 2 VIEW  Comparison: 08/24/2012  Findings: Changes from prior CABG surgery are stable.  The cardiac silhouette is mildly enlarged.  No mediastinal or hilar mass or adenopathy is noted.  The left anterior chest Sofija Antwi sequential pacemaker is also stable and well positioned.  There are mild coarse reticular opacities in the lung bases, more prominent on the right, and lung apices, most consistent with chronic scarring.  There are no acute findings in the lungs.  No pleural effusion or pneumothorax.  Chronic fracture of the left proximal humerus is stable from the prior exam.  IMPRESSION: No acute cardiopulmonary disease.  No change from the recent prior study.   Original Report Authenticated By: Amie Portland, M.D.     Current Medications:     .  ALPRAZolam  0.25 mg Oral QHS  . amiodarone  100 mg Oral QHS  . aspirin EC  81 mg Oral Daily  . atorvastatin  10 mg Oral q1800  . carvedilol  12.5 mg Oral BID WC  . docusate sodium  200 mg Oral BID  . furosemide  40 mg Intravenous BID  . HYDROcodone-acetaminophen  1 tablet Oral Q4H  . isosorbide mononitrate  30 mg Oral Daily  . levothyroxine  50 mcg Oral QHS  . pantoprazole  40 mg Oral Daily  . sodium chloride  3 mL Intravenous Q12H  . Tamsulosin HCl  0.4 mg Oral Daily  . Vitamin D (Ergocalciferol)  50,000 Units Oral Q7 days      . sodium chloride 20 mL/hr at 08/30/12 1343    ASSESSMENT AND PLAN: Principal Problem:  *Acute on chronic systolic CHF (congestive heart failure), NYHA class 4 - Continue IV Lasix and reassess in am.   Active Problems:  CKD (chronic kidney disease), stage IV - Cr slightly worse but breathing better, follow.   DNR (do not resuscitate) - pt/family in agreement.   Anemia, B12 deficiency - follow   Hypothyroidism - TSH slightly elevated 1/13 at 4.935, rec f/u with primary MD, no dose change in synthroid for now.  Plan - keep in TCU, family wants to pursue placement as they cannot manage her at home. [facility] to meet with pt and family to decide aggressiveness of future care as intervals between hospitalizations are getting shorter.  Signed, Theodore Demark , PA-C 11:59 AM 08/31/2012 Patient examined and agree. I would vote  for palliative care.  Valera Castle, MD 08/31/2012 12:13 PM

## 2012-09-01 DIAGNOSIS — R52 Pain, unspecified: Secondary | ICD-10-CM

## 2012-09-01 DIAGNOSIS — F419 Anxiety disorder, unspecified: Secondary | ICD-10-CM

## 2012-09-01 DIAGNOSIS — I509 Heart failure, unspecified: Secondary | ICD-10-CM

## 2012-09-01 DIAGNOSIS — F411 Generalized anxiety disorder: Secondary | ICD-10-CM

## 2012-09-01 DIAGNOSIS — K5900 Constipation, unspecified: Secondary | ICD-10-CM

## 2012-09-01 DIAGNOSIS — R0609 Other forms of dyspnea: Secondary | ICD-10-CM

## 2012-09-01 LAB — URINE CULTURE: Colony Count: NO GROWTH

## 2012-09-01 MED ORDER — LACTULOSE 10 GM/15ML PO SOLN
20.0000 g | Freq: Four times a day (QID) | ORAL | Status: DC | PRN
Start: 2012-09-01 — End: 2012-09-01
  Filled 2012-09-01: qty 30

## 2012-09-01 MED ORDER — LACTULOSE 10 GM/15ML PO SOLN
20.0000 g | Freq: Two times a day (BID) | ORAL | Status: DC | PRN
  Filled 2012-09-01: qty 30

## 2012-09-01 MED ORDER — ALPRAZOLAM 0.5 MG PO TABS
0.5000 mg | ORAL_TABLET | Freq: Every day | ORAL | Status: DC
  Administered 2012-09-01 – 2012-09-04 (×4): 0.5 mg via ORAL
  Filled 2012-09-01 (×3): qty 1

## 2012-09-01 MED ORDER — SENNOSIDES-DOCUSATE SODIUM 8.6-50 MG PO TABS
1.0000 | ORAL_TABLET | Freq: Two times a day (BID) | ORAL | Status: DC
  Administered 2012-09-01 – 2012-09-05 (×9): 1 via ORAL
  Filled 2012-09-01 (×10): qty 1

## 2012-09-01 MED ORDER — ALPRAZOLAM 0.5 MG PO TABS
0.5000 mg | ORAL_TABLET | Freq: Two times a day (BID) | ORAL | Status: DC | PRN
  Administered 2012-09-01 – 2012-09-03 (×3): 0.5 mg via ORAL
  Filled 2012-09-01 (×4): qty 1

## 2012-09-01 NOTE — Progress Notes (Addendum)
Patient [name] [name]      DOB: May 06, 1925      AOZ:[id number]  Patient is 79 yo WF,with PMH CAD (s/p CABG 2004), CKD (stage 4),Ichemic Cardiomyopathy  (LVEF 35-40% 10/01/11), NYHA class 4,recently admitted on 08/14/12 with c/o chest pain ( r/o MI-pain felt to be non-cardiac) discharged 08/29/12. Readmitted on 08/30/12 with SOB,requiring diuresis.   Spoke with Daughter, Enriqueta Shutter [phone]) her and sister, Lucienne Minks are agreeable to meet with Palliative Care team for Goals of Care meeting today at 2pm. Given PMT phone number [phone] for any scheduling issues.  Freddie Breech, CNS-C Palliative Medicine Team [facility] Health Team Phone: [phone] Pager: [phone]

## 2012-09-01 NOTE — Care Management Note (Signed)
    Page 1 of 2   09/01/2012     8:01:29 AM   CARE MANAGEMENT NOTE 09/01/2012  Patient:  [name] [name], [name] [name]   Account Number:  [id number]  Date Initiated:  08/29/2012  Documentation initiated by:  Tera Mater  Subjective/Objective Assessment:   79yo female admitted with Chest Pain.  Pt. lives at home with daughter.     Action/Plan:   In to speak with pt. about [facility] services and DME.  Pt. is interested about HH and would like a rolling walker, wheelchair, and 3-N-1.   Anticipated DC Date:  08/29/2012   Anticipated DC Plan:  HOME W HOME HEALTH SERVICES      DC Planning Services  CM consult      PAC Choice  DURABLE MEDICAL EQUIPMENT  HOME HEALTH   Choice offered to / List presented to:  C-1 Patient   DME arranged  3-N-1  WALKER - ROLLING  WHEELCHAIR - MANUAL      DME agency  [facility]     HH arranged  HH-2 PT      [facility] agency  [facility]   Status of service:  Completed, signed off Medicare Important Message given?   (If response is "NO", the following Medicare IM given date fields will be blank) Date Medicare IM given:   Date Additional Medicare IM given:    Discharge Disposition:  HOME W HOME HEALTH SERVICES  Per UR Regulation:  Reviewed for med. necessity/level of care/duration of stay  If discussed at Long Length of Stay Meetings, dates discussed:    Comments:  08/29/12 1650 Pt. would like [facility] for [facility] and would like her DME delievered.  Explained to pt. that there would be a charge for home delievery of equipment, and pt. agreed to this .  TC to [place], with [facility], to give referral for HHPT, and TC to [facility], for delievery of DME rolling walker, wheelchair, and 3-N-1.  Pt. to dc home today with son. Tera Mater, RN, BSN NCM [phone]

## 2012-09-01 NOTE — Consult Note (Signed)
Patient XB:MWUXLKGM E Shaheen      DOB: March 25, 1925      WNU:[id number]     Consult Note from the Palliative Medicine Team at [facility]    Consult Requested by: Dr Antoine Poche     PCP: Pamelia Hoit, MD Reason for Consultation:Goals of Care     Phone Number:[phone]  Assessment of patients Current state: Patient alert, oriented, sitting up in bed, oxygen on via nasal cannula, respirations unlabored at rest. Appetite has been good.  Spoke with staff, reviewed chart proceeded to have Goals of care meeting with patient and her daughters Lucienne Minks, and Enriqueta Shutter (cell phone [phone]). We talked about mothers current health in context with frequent recent hospitalizations, long-standing heart failure, CKD. Previously mother was staying alternately with the 2 daughters, and they stated they are unable to take care of her due to their own health limitations. Patients ability to ambulate limited by her DOE.   They stated they would like mother placed where her acute medical needs could be tended to promptly, (exacerbations of CHF). We talked about the philosophy of palliative care and hospice and the services provided. We talked about residential hospice eligibility (life span of 4-6 weeks or less), and the likelihood that mother is not eligible for that service at his point. Mentioned hospice services at home and the services provided, but the daughters emphasized they could not consider taking care of mother at home any more. I explained that with only Medicare coverage mother would not be eligible for hospice coverage upon discharge to SNF. They also informed me that Medicaid coverage was being sought , but the process was almost completed but stopped with recent admission, and they were told she is eligible. I encouraged them to speak with social work to see if this process can be expedited, which would increase mothers possibility of being able to receive hospice services eventually at a SNF.  We also talked about having Palliative Care Services from [facility] follow patient at SNF if patient is discharged locally where the service is offered, and then transition to hospice support if service is covered once Medicaid is awarded. They were agreeable with that.   Also engaged in decision-making with family regarding concepts of Medical Orders for Scope of Treatment pertaining to cardiac and pulmonary resuscitation, desire for acute future medical interventions for issues, the use of antibiotic therapy, intravenous hydration and continuing artificial tube feedings. Both patient and daughters would want to seek acute medical attention at this point if symptoms could not be controlled at SNF. We completed a MOST form and it was placed on chart with DNR goldenrod form. Contacted case manager she will contact SW covering area today inform them to contact patients daughters to begin [facility] application process and selections for SNF placement.  Goals of Care: 1.  Code Status:DNR/DNI   2. Scope of Treatment: 1. Vital Signs:routine 2. Respiratory/Oxygen: support as needed for symptom control 3. Nutritional Support/Tube Feeds: Would NOT want the use of artificial feeding tubes for nutrition support 4. Antibiotics:use as indicated for identified infections  5. Blood Products: as indicated 6. IVF: for defined period of time 7. Labs: as needed 8. Telemetry: as needed 9. Consults: spiritual   4. Disposition: plan for discharge to SNF with Palliative care services to follow   3. Symptom Management:   1. Anxiety/Agitation: has been on Xanax for past 1-2 years, will increase dose to 0.5 twice daily as needed and at [facility], encouraged patient to take when anxious  2. Pain: has been on Vicodin, every 4 hours, states this does help SOB 3. Bowel Regimen: problems with constipation related to sedentary lifestyle due to extensive CHF, and also chronic opioid use. Ordered Senakot-S twice daily, and  Lactulose BID as needed, states Miralax not effective  4. Psychosocial:Emotional support and education regarding hospice and palliative services in the community  5. Spiritual:chaplin involved   Patient Documents Completed or Given: Document Given Completed  Advanced Directives Pkt    MOST  YES  DNR  YES  Gone from My Sight    Hard Choices  YES    Brief HPI: Patient is 79 yo WF,with PMH CAD (s/p CABG 2004), CKD (stage 4),Ichemic Cardiomyopathy (LVEF 35-40% 10/01/11), NYHA class 4,recently admitted on 08/14/12 with c/o chest pain ( r/o MI-pain felt to be non-cardiac) discharged 08/29/12. Readmitted on 08/30/12 with SOB,requiring diuresis.     ROS: +DOE, +anxiety, + constipation, + pain in shoulder, denies n/v, chest pain, fever, diarrhea or depression    PMH:  Past Medical History  Diagnosis Date  . Coronary artery disease     a. LAD stent 1999 after NSTEMI. b. CABG 2004. c. Last LHC 02/2010 - patent grafts.  . Hypertension   . Hyperlipidemia   . Carotid artery disease     status post right carotid endarterectomy.  . Degenerative joint disease   . Restless leg syndrome   . Chronic renal insufficiency   . Heparin induced thrombocytopenia   . Diphtheria     "as a child"  . Pleural effusion, bilateral 06/2003  . Pseudoaneurysm     status post repair following catherization in 1999  . Chronic systolic heart failure 10/02/11  . Ischemic cardiomyopathy     Echocardiogram 10/01/11: Mild LVH, EF 35-40%, basal inferior, basal to mid posterior and basal to mid anterolateral severe hypokinesis, mild to moderate AI, moderate MR (ischemic MR), moderate LAE, mild RVE, mildly reduced RV systolic function, mild RAE, PASP 43-44  . COPD (chronic obstructive pulmonary disease)   . Atrial fibrillation/flutter     s/p multiple DCCVs;  amiodarone started 09/2011, TEE DCCV 3/13;  amio and coumadin d/c'd after admxn to [facility] 02/2012 with frequent falls/syncope  . Tachycardia-bradycardia syndrome     s/p  Medtronic pacemaker implant 09/2011 (8 sec pause noted)  . GERD (gastroesophageal reflux disease)   . Renal artery stenosis     bilateral- status post stenting  . Non-functioning kidney   . Depression     "cries alot"  . Gout   . Hypothyroidism   . Anemia      PSH: Past Surgical History  Procedure Date  . Carotid endarterectomy 05/2003    right  . Pacemaker insertion 10/02/11    implanted by Dr Johney Frame  . Cholecystectomy ?02/2011  . Tonsillectomy and adenoidectomy   . Renal artery stent 06/2003    bilaterally/e-chart  . Thoracentesis ? 2000; 05/2011  . Coronary angioplasty with stent placement 1999  . Av fistula repair 01/1998    w/pseudoaneurysm repair S/P catheterization/E-chart  . Coronary artery bypass graft 05/2003    CABG X 3  . Tee without cardioversion 10/23/2011    Procedure: TRANSESOPHAGEAL ECHOCARDIOGRAM (TEE);  Surgeon: Wendall Stade, MD;  Location: [facility] ENDOSCOPY;  Service: Cardiovascular;  Laterality: N/A;  . Cardioversion 10/23/2011    Procedure: CARDIOVERSION;  Surgeon: Wendall Stade, MD;  Location: [facility] ENDOSCOPY;  Service: Cardiovascular;  Laterality: N/A;   I have reviewed the FH and SH and  If appropriate update it  with new information. Allergies  Allergen Reactions  . Altace (Ramipril) Hives  . Contrast Media (Iodinated Diagnostic Agents) Other (See Comments)    Reaction noted by md-unknown reaction per family  . Heparin Other (See Comments)    Clots blood instead of thinning  . Hydromorphone Hcl Other (See Comments)    Crazy feeling   Scheduled Meds:   . ALPRAZolam  0.5 mg Oral QHS  . amiodarone  100 mg Oral QHS  . aspirin EC  81 mg Oral Daily  . atorvastatin  10 mg Oral q1800  . carvedilol  12.5 mg Oral BID WC  . furosemide  40 mg Intravenous BID  . HYDROcodone-acetaminophen  1 tablet Oral Q4H  . isosorbide mononitrate  30 mg Oral Daily  . levothyroxine  50 mcg Oral QHS  . pantoprazole  40 mg Oral Daily  . senna-docusate  1 tablet Oral BID  .  sodium chloride  3 mL Intravenous Q12H  . Tamsulosin HCl  0.4 mg Oral Daily  . Vitamin D (Ergocalciferol)  50,000 Units Oral Q7 days   Continuous Infusions:   . sodium chloride 20 mL/hr at 08/30/12 1343   PRN Meds:.sodium chloride, acetaminophen, ALPRAZolam, alum & mag hydroxide-simeth, lactulose, nitroGLYCERIN, ondansetron (ZOFRAN) IV, polyethylene glycol, sodium chloride, zolpidem    BP 121/58  Pulse 70  Temp 98.1 F (36.7 C) (Oral)  Resp 19  Ht 5\' 4"  (1.626 m)  Wt 51.5 kg (113 lb 8.6 oz)  BMI 19.49 kg/m2  SpO2 95%   PPS:50%                 08/30/12 Albumin 3.4   Intake/Output Summary (Last 24 hours) at 09/01/12 1548 Last data filed at 09/01/12 1100  Gross per 24 hour  Intake    300 ml  Output   2200 ml  Net  -1900 ml   LBM:08/22/12                      Stool Softner: yes  Physical Exam:  General: alert, verbal responses appropriate HEENT: anicteric, buccal mucosa moist, no lesions Chest: CTA diminished at bases CVS: RRR Abdomen:soft, non-tender, BS audible Ext: no pedal edema or mottling Neuro:oriented x 3  Labs: CBC    Component Value Date/Time   WBC 6.0 08/30/2012 1335   RBC 3.54* 08/30/2012 1335   HGB 10.2* 08/30/2012 1335   HCT 31.3* 08/30/2012 1335   PLT 196 08/30/2012 1335   MCV 88.4 08/30/2012 1335   MCH 28.8 08/30/2012 1335   MCHC 32.6 08/30/2012 1335   RDW 17.4* 08/30/2012 1335   LYMPHSABS 0.9 08/30/2012 1335   MONOABS 0.7 08/30/2012 1335   EOSABS 0.1 08/30/2012 1335   BASOSABS 0.0 08/30/2012 1335    BMET    Component Value Date/Time   NA 133* 08/31/2012 0844   K 4.7 08/31/2012 0844   CL 95* 08/31/2012 0844   CO2 29 08/31/2012 0844   GLUCOSE 97 08/31/2012 0844   BUN 37* 08/31/2012 0844   CREATININE 1.86* 08/31/2012 0844   CREATININE 1.98* 07/16/2012 1055   CALCIUM 9.2 08/31/2012 0844   GFRNONAA 24* 08/31/2012 0844   GFRAA 27* 08/31/2012 0844    CMP     Component Value Date/Time   NA 133* 08/31/2012 0844   K 4.7 08/31/2012 0844   CL 95* 08/31/2012  0844   CO2 29 08/31/2012 0844   GLUCOSE 97 08/31/2012 0844   BUN 37* 08/31/2012 0844   CREATININE 1.86* 08/31/2012 0844  CREATININE 1.98* 07/16/2012 1055   CALCIUM 9.2 08/31/2012 0844   PROT 7.3 08/30/2012 1335   ALBUMIN 3.4* 08/30/2012 1335   AST 13 08/30/2012 1335   ALT 7 08/30/2012 1335   ALKPHOS 64 08/30/2012 1335   BILITOT 0.5 08/30/2012 1335   GFRNONAA 24* 08/31/2012 0844   GFRAA 27* 08/31/2012 0844    Chest Xray Reviewed/Impressions:08/30/12 IMPRESSION:  Stable cardiomegaly. No acute cardiopulmonary disease identified.   Time In Time Out Total Time Spent with Patient Total Overall Time  2:00p 3:30p 90 min 90 min    Greater than 50%  of this time was spent counseling and coordinating care related to the above assessment and plan.  Freddie Breech, CNS-C Palliative Medicine Team [facility] Health Team Phone: [phone] Pager: [phone]

## 2012-09-01 NOTE — Telephone Encounter (Signed)
New problem:   Daughter Chip Boer calling,    Mother in room 2922 at [facility]. Need to discuss care, hospice care.

## 2012-09-01 NOTE — Telephone Encounter (Signed)
Will forward to Dr Antoine Poche to discuss with daughter Chip Boer)

## 2012-09-01 NOTE — Progress Notes (Signed)
SUBJECTIVE:  She says that she is breathing better.  However, she is anxious because of the acuity of this event.      PHYSICAL EXAM Filed Vitals:   08/31/12 2007 08/31/12 2157 08/31/12 2304 09/01/12 0313  BP: 120/61 115/61 120/67 119/64  Pulse: 70 70 70 70  Temp: 97.6 F (36.4 C)  97.8 F (36.6 C) 97.1 F (36.2 C)  TempSrc: Oral  Oral Oral  Resp: 25 17 18 16   Height:      Weight:    113 lb 8.6 oz (51.5 kg)  SpO2: 96% 98% 99% 94%   General:  No acute distress but frail. Lungs:  Decreased breath sounds left lung unchanged Heart:  RRR Abdomen:  Positive bowel sounds, no rebound no guarding. Extremities:  No edema  LABS:  Results for orders placed during the hospital encounter of 08/30/12 (from the past 24 hour(s))  BASIC METABOLIC PANEL     Status: Abnormal   Collection Time   08/31/12  8:44 AM      Component Value Range   Sodium 133 (*) 135 - 145 mEq/L   Potassium 4.7  3.5 - 5.1 mEq/L   Chloride 95 (*) 96 - 112 mEq/L   CO2 29  19 - 32 mEq/L   Glucose, Bld 97  70 - 99 mg/dL   BUN 37 (*) 6 - 23 mg/dL   Creatinine, Ser 1.91 (*) 0.50 - 1.10 mg/dL   Calcium 9.2  8.4 - 47.8 mg/dL   GFR calc non Af Amer 24 (*) >90 mL/min   GFR calc Af Amer 27 (*) >90 mL/min  URINALYSIS, ROUTINE W REFLEX MICROSCOPIC     Status: Abnormal   Collection Time   08/31/12  2:01 PM      Component Value Range   Color, Urine YELLOW  YELLOW   APPearance CLOUDY (*) CLEAR   Specific Gravity, Urine 1.016  1.005 - 1.030   pH 6.0  5.0 - 8.0   Glucose, UA NEGATIVE  NEGATIVE mg/dL   Hgb urine dipstick SMALL (*) NEGATIVE   Bilirubin Urine NEGATIVE  NEGATIVE   Ketones, ur NEGATIVE  NEGATIVE mg/dL   Protein, ur NEGATIVE  NEGATIVE mg/dL   Urobilinogen, UA 0.2  0.0 - 1.0 mg/dL   Nitrite NEGATIVE  NEGATIVE   Leukocytes, UA LARGE (*) NEGATIVE  URINE MICROSCOPIC-ADD ON     Status: Abnormal   Collection Time   08/31/12  2:01 PM      Component Value Range   Squamous Epithelial / LPF MANY (*) RARE   WBC, UA  21-50  <3 WBC/hpf   RBC / HPF 0-2  <3 RBC/hpf   Casts HYALINE CASTS (*) NEGATIVE    Intake/Output Summary (Last 24 hours) at 09/01/12 0626 Last data filed at 09/01/12 0400  Gross per 24 hour  Intake    300 ml  Output   1950 ml  Net  -1650 ml   ASSESSMENT AND PLAN:  1. Acute on chronic CHF:    I will get Palliative care involved.   I do not see a clear reason that she did this as she was breathing at baseline and her BNP on admit was lower than previous.  Weights have been running around 114 - 115.  We are limited by her CKD.  BP has been stable.  We might need to run her very dry with Creats above 2 acceptable. Continue IV Lasix currently.  2. Ischemic Cardiomyopathy, LVEF 35-40% by echo 10/01/2011, NYHA  class 4 :  As above  3. CAD s/p CABG in 2004 (grafts patent by cath 02/2010) :  As above  4. Chronic Kidney Disease (stage 4):  Creat is at baseline.  5. Anemia:  No active sign of bleeding.  Hgb is stable.    6. UTI:  This was treated as an outpatient.  This sample might be contaminated.    [name] [facility] 09/01/2012 6:26 AM

## 2012-09-01 NOTE — Progress Notes (Signed)
Utilization Review Completed.   Jrue Yambao, RN, BSN Nurse Case Manager  [phone]  

## 2012-09-01 NOTE — Care Management Note (Signed)
    Page 1 of 1   09/01/2012     10:17:56 AM   CARE MANAGEMENT NOTE 09/01/2012  Patient:  [name] [name], [name] [name]   Account Number:  [id number]  Date Initiated:  09/01/2012  Documentation initiated by:  Junius Creamer  Subjective/Objective Assessment:   adm w heart failure     Action/Plan:   lives w fam, act w [facility]   Anticipated DC Date:     Anticipated DC Plan:  SKILLED NURSING FACILITY  In-house referral  Clinical Social Worker  Hospice / Palliative Care      DC Planning Services  CM consult      [facility] Choice  Resumption Of Svcs/PTA Provider   Choice offered to / List presented to:          [facility] arranged  HH-1 RN      [facility] agency  [facility]   Status of service:   Medicare Important Message given?   (If response is "NO", the following Medicare IM given date fields will be blank) Date Medicare IM given:   Date Additional Medicare IM given:    Discharge Disposition:  SKILLED NURSING FACILITY  Per UR Regulation:  Reviewed for med. necessity/level of care/duration of stay  If discussed at Long Length of Stay Meetings, dates discussed:    Comments:  1/20 1016 debbie Imogean Ciampa rn,bsn pt act w [facility]. fam feels cannot care for pt at home now. sw ref for short term snf for rehab.

## 2012-09-01 NOTE — Progress Notes (Signed)
Pharmacist Heart Failure Core Measure Documentation  Assessment: [name] [name] has an EF documented as 35% -40% on 08/31/2011 by Echo.  Rationale: Heart failure patients with left ventricular systolic dysfunction (LVSD) and an EF < 40% should be prescribed an angiotensin converting enzyme inhibitor (ACEI) or angiotensin receptor blocker (ARB) at discharge unless a contraindication is documented in the medical record.  This patient is not currently on an ACEI or ARB for HF.  This note is being placed in the record in order to provide documentation that a contraindication to the use of these agents is present for this encounter.  ACE Inhibitor or Angiotensin Receptor Blocker is contraindicated (specify all that apply)  []   ACEI allergy AND ARB allergy []   Angioedema []   Moderate or severe aortic stenosis []   Hyperkalemia []   Hypotension []   Renal artery stenosis [x]   Worsening renal function, preexisting renal disease or dysfunction  Harland German, Pharm D 09/01/2012 2:13 PM

## 2012-09-02 LAB — GLUCOSE, CAPILLARY: Glucose-Capillary: 100 mg/dL — ABNORMAL HIGH (ref 70–99)

## 2012-09-02 LAB — CBC
HCT: 29.6 % — ABNORMAL LOW (ref 36.0–46.0)
Hemoglobin: 9.2 g/dL — ABNORMAL LOW (ref 12.0–15.0)
MCV: 89.4 fL (ref 78.0–100.0)
RDW: 17.2 % — ABNORMAL HIGH (ref 11.5–15.5)
WBC: 5.6 10*3/uL (ref 4.0–10.5)

## 2012-09-02 MED ORDER — LIDOCAINE 5 % EX PTCH
1.0000 | MEDICATED_PATCH | CUTANEOUS | Status: DC
  Administered 2012-09-02 – 2012-09-04 (×3): 1 via TRANSDERMAL
  Filled 2012-09-02 (×4): qty 1

## 2012-09-02 NOTE — Progress Notes (Signed)
   SUBJECTIVE:  She feels OK.  She thinks that her breathing is at baseline.  She reports no pain  PHYSICAL EXAM Filed Vitals:   09/02/12 0300 09/02/12 0400 09/02/12 0640 09/02/12 0730  BP:  124/59  131/67  Pulse: 73 70 73 70  Temp:  97.4 F (36.3 C)  98.1 F (36.7 C)  TempSrc:  Oral  Oral  Resp: 11 21 16 17   Height:   5\' 4"  (1.626 m)   Weight:   112 lb 14 oz (51.2 kg)   SpO2: 96% 94% 99% 97%   General:  No acute distress but frail. Lungs:  Decreased breath sounds left lung unchanged Heart:  RRR Abdomen:  Positive bowel sounds, no rebound no guarding. Extremities:  No edema  LABS:  No results found for this or any previous visit (from the past 24 hour(s)).  Intake/Output Summary (Last 24 hours) at 09/02/12 0913 Last data filed at 09/02/12 0600  Gross per 24 hour  Intake    780 ml  Output   1975 ml  Net  -1195 ml   ASSESSMENT AND PLAN:  1. Acute on chronic CHF:    I appreciate Palliative care team consult.  I tried to call [name] [name]  (daughter) on cell and at home last night .  She had good urine output yesterday.  Her creat remains stable.  As mentioned in my note yesterday I supsect her baseline creat to keep her on the dry side will be closer to 2.  I will continue current diuretic.  I will check a BMET today.  2. Ischemic Cardiomyopathy, LVEF 35-40% by echo 10/01/2011, NYHA class 4 :  As above  3. CAD s/p CABG in 2004 (grafts patent by cath 02/2010) :  As above  4. Chronic Kidney Disease (stage 4):  I will order a creat today.   5. Anemia:  No active sign of bleeding.  I will check a CBC    6. UTI:  This was treated in the hospital at the last appt.  Cultures this admit are negative.   [name] [name] [facility] 09/02/2012 9:13 AM

## 2012-09-02 NOTE — Progress Notes (Signed)
Transferred to 4729 by bed, stable, belongings with pt. Report given toRN

## 2012-09-02 NOTE — Telephone Encounter (Signed)
Pt currently in hospital. Will have triage call her day after hospital discharge

## 2012-09-02 NOTE — Telephone Encounter (Signed)
New problem:   Per Ward Givens. - Transition of Care.  appt on  1/27.

## 2012-09-03 DIAGNOSIS — R52 Pain, unspecified: Secondary | ICD-10-CM

## 2012-09-03 LAB — BASIC METABOLIC PANEL
BUN: 41 mg/dL — ABNORMAL HIGH (ref 6–23)
CO2: 31 mEq/L (ref 19–32)
Chloride: 97 mEq/L (ref 96–112)
Creatinine, Ser: 1.82 mg/dL — ABNORMAL HIGH (ref 0.50–1.10)
Glucose, Bld: 90 mg/dL (ref 70–99)

## 2012-09-03 MED ORDER — NAPHAZOLINE HCL 0.1 % OP SOLN
2.0000 [drp] | Freq: Four times a day (QID) | OPHTHALMIC | Status: DC | PRN
  Filled 2012-09-03: qty 15

## 2012-09-03 MED ORDER — FUROSEMIDE 40 MG PO TABS
40.0000 mg | ORAL_TABLET | Freq: Two times a day (BID) | ORAL | Status: DC
  Administered 2012-09-03 – 2012-09-05 (×5): 40 mg via ORAL
  Filled 2012-09-03 (×7): qty 1

## 2012-09-03 NOTE — Evaluation (Signed)
Physical Therapy Evaluation Patient Details Name: [name] [name] MRN: [id number] DOB: 1925-01-22 Today's Date: 09/03/2012 Time: 4098-1191 PT Time Calculation (min): 33 min  PT Assessment / Plan / Recommendation Clinical Impression  Patient is 79 yo WF,with PMH CAD (s/p CABG 2004), CKD (stage 4),Ichemic Cardiomyopathy  (LVEF 35-40% 10/01/11), NYHA class 4,recently admitted on 08/14/12 with c/o chest pain ( r/o MI-pain felt to be non-cardiac) discharged 08/29/12. Readmitted on 08/30/12 with SOB,requiring diuresis. Palliative care has been consulted and plan is to d/c to SNF with palliative to follow. Presents to PT with below impairments affecting independence and quality of life. Will benefit physical therapy in the acute setting to maximize mobility for improved quality of life.     PT Assessment  Patient needs continued PT services    Follow Up Recommendations  SNF    Does the patient have the potential to tolerate intense rehabilitation      Barriers to Discharge Decreased caregiver support family cannot provide 24 hour assist anymore    Equipment Recommendations  None recommended by PT    Recommendations for Other Services     Frequency Min 2X/week    Precautions / Restrictions Precautions Precautions: Fall Restrictions Weight Bearing Restrictions: No   Pertinent Vitals/Pain C/o abdominal pain, RN made aware; dizziness complaints, BP 114/70; SaO2 on 3 liters 100% sitting EOB      Mobility  Bed Mobility Bed Mobility: Supine to Sit;Sit to Supine;Scooting to HOB Supine to Sit: 4: Min guard Sit to Supine: 5: Supervision Scooting to [facility]: 3: Mod assist Details for Bed Mobility Assistance: gaurding for safety, use of pad to assist with scooting Transfers Transfers: Stand to Sit (sit<>stand x3) Sit to Stand: 4: Min assist;From chair/3-in-1;From bed Stand to Sit: 4: Min guard Details for Transfer Assistance: cues for hand placement and facilitation for lift off and follow  through as well as anterior translation of trunk over BOS Ambulation/Gait Ambulation/Gait Assistance: 4: Min assist Ambulation Distance (Feet): 30 Feet (2 seated rest breaks) Assistive device: Rolling walker Ambulation/Gait Assistance Details: cues for safe positioning within RW as well as tall posture, easily fatigued, needed 2 seated rest breaks; c/o dizziness, BP 114/70 Gait Pattern: Trunk flexed;Shuffle              PT Diagnosis: Difficulty walking;Abnormality of gait;Generalized weakness;Acute pain  PT Problem List: Decreased strength;Decreased activity tolerance;Decreased balance;Decreased mobility;Decreased knowledge of use of DME;Pain PT Treatment Interventions: DME instruction;Gait training;Functional mobility training;Therapeutic activities;Therapeutic exercise;Balance training;Patient/family education   PT Goals Acute Rehab PT Goals PT Goal Formulation: With patient Time For Goal Achievement: 09/10/12 Potential to Achieve Goals: Fair Pt will go Supine/Side to Sit: with modified independence PT Goal: Supine/Side to Sit - Progress: Goal set today Pt will go Sit to Supine/Side: with modified independence PT Goal: Sit to Supine/Side - Progress: Goal set today Pt will go Sit to Stand: with supervision PT Goal: Sit to Stand - Progress: Goal set today Pt will go Stand to Sit: with supervision PT Goal: Stand to Sit - Progress: Goal set today Pt will Transfer Bed to Chair/Chair to Bed: with supervision PT Transfer Goal: Bed to Chair/Chair to Bed - Progress: Goal set today Pt will Ambulate: with supervision;16 - 50 feet PT Goal: Ambulate - Progress: Goal set today  Visit Information  Last PT Received On: 09/03/12 Assistance Needed: +1    Subjective Data  Subjective: I just feel smothered all the time.  Patient Stated Goal: to walk   Prior Functioning  Home Living Lives  With: Daughter Available Help at Discharge: Family;Available PRN/intermittently Type of Home:  House Home Access: Level entry Home Layout: One level Home Adaptive Equipment: Walker - rolling Prior Function Level of Independence: Needs assistance Driving: No Communication Communication: No difficulties    Cognition  Overall Cognitive Status: Appears within functional limits for tasks assessed/performed Arousal/Alertness: Awake/alert Orientation Level: Appears intact for tasks assessed Behavior During Session: [facility] for tasks performed    Extremity/Trunk Assessment Right Upper Extremity Assessment RUE ROM/Strength/Tone: [facility] for tasks assessed Left Upper Extremity Assessment LUE ROM/Strength/Tone: Deficits LUE ROM/Strength/Tone Deficits: has broken this arm, has difficulty lifting her arm to RW Right Lower Extremity Assessment RLE ROM/Strength/Tone: Deficits RLE ROM/Strength/Tone Deficits: generally weak, grossly 3-4/5 Left Lower Extremity Assessment LLE ROM/Strength/Tone Deficits: generally weak, grossly 3-4/5 Trunk Assessment Trunk Assessment: Kyphotic   Balance    End of Session PT - End of Session Equipment Utilized During Treatment: Gait belt Activity Tolerance: Patient limited by fatigue Patient left: in bed;with call bell/phone within reach;with bed alarm set Nurse Communication: Mobility status  GP     [facility] HELEN 09/03/2012, 12:37 PM

## 2012-09-03 NOTE — Progress Notes (Signed)
Patient complains of both eyes are having a burning sensation for the past couple of hours. Patient thought it would go away but has not and is uncomfortable. Paged MD, orders given for saline eye drops to be given as needed. Notified pharmacy for eye drops. If available to give before change of shift will administer to patient, if not will notify oncoming nurse that patient is in need of eye drops when available from pharmacy. Will continue to monitor to end of shift.

## 2012-09-03 NOTE — Clinical Social Work Psychosocial (Signed)
     Clinical Social Work Department BRIEF PSYCHOSOCIAL ASSESSMENT 09/03/2012  Patient:  [name] [name], [name] [name]     Account Number:  [id number]     Admit date:  08/30/2012  Clinical Social Worker:  Tiburcio Pea  Date/Time:  09/03/2012 02:51 PM  Referred by:  Physician  Date Referred:  09/02/2012 Referred for  SNF Placement   Other Referral:   Transferred from 2900.Marland Kitchen  Not seen by a CSW during stay on 2900.   Interview type:  Other - See comment Other interview type:   Patient and daughter 11- via phone    PSYCHOSOCIAL DATA Living Status:  WITH ADULT CHILDREN Admitted from facility:   Level of care:   Primary support name:  [name] [name]  [phone] Primary support relationship to patient:  CHILD, ADULT Degree of support available:   Stron support  Daughter [name] [name] [phone]    CURRENT CONCERNS Current Concerns  Post-Acute Placement   Other Concerns:   DC home 1/17 and readmitted 08/30/12. Daughters cannot manage care at home.    SOCIAL WORK ASSESSMENT / PLAN 79 year old female who is familiar to this CSW as she was d/c'd home on 08/29/12 with plan to seek placement at [facility] on 09/01/12.  Patient returned to hospital the next day and was placed on unit 2900. She has now been moved to 2700. Spoke with patient and her daughter [name] [name] today. She states that she and her sister [name] [name] have been caring for patient (alternating 2 weeks) but this arrangement is no longer working and they are requesting placement. With last admit- CSW was working on placement at [facility] which is their choice as patient has been there in the past.  Clovis Cao has been placed on chart for MD's signature and bed search intiated in [facility] Extended bed search beyond [facility] discussed with daughter who verbalized understanding. CSW notified patient of same.  She has been seen by Palliative Care and they recommended palliative care to follow patient at SNF with bridge to  Hospice.  PT has evaluated patient and recommend PT.  She will require insurance auth. Above was discussed with admissions at [facility] who will begin auth request.  Will monitor for bed stability per MD.   Assessment/plan status:  Psychosocial Support/Ongoing Assessment of Needs Other assessment/ plan:   Information/referral to community resources:   SNF bed list placed in room with patient.    PATIENTS/FAMILYS RESPONSE TO PLAN OF CARE: Patient is reluctant but does agree to bed search. She only wants [facility] and CSW cannot promise this to happen. Discussed need for county bed search as her daughter's can no longer meet her needs at home. Patient verbalized undersstanding but remains strong on desire to go to [place].  CSW is awaiting bed offers.

## 2012-09-03 NOTE — Progress Notes (Signed)
SUBJECTIVE:  She feels OK.  She had some shoulder pain but this is improved.  She ambulated to the bathroom yesterday and had a bowel movement.    PHYSICAL EXAM Filed Vitals:   09/02/12 1200 09/02/12 1645 09/02/12 2002 09/03/12 0649  BP: 113/68 125/71 117/42 119/67  Pulse: 70 72 70 70  Temp: 98.3 F (36.8 C) 97.9 F (36.6 C) 97.8 F (36.6 C) 97.5 F (36.4 C)  TempSrc: Oral Oral Oral Oral  Resp: 13 16 19 20   Height:  5\' 4"  (1.626 m)    Weight:  114 lb 13.8 oz (52.1 kg)  110 lb 14.3 oz (50.3 kg)  SpO2: 98% 98% 100% 98%   General:  No acute distress but frail. Lungs:  Few basilar crackles Heart:  RRR Abdomen:  Positive bowel sounds, no rebound no guarding. Extremities:  No edema  LABS:  Results for orders placed during the hospital encounter of 08/30/12 (from the past 24 hour(s))  CBC     Status: Abnormal   Collection Time   09/02/12  2:39 PM      Component Value Range   WBC 5.6  4.0 - 10.5 K/uL   RBC 3.31 (*) 3.87 - 5.11 MIL/uL   Hemoglobin 9.2 (*) 12.0 - 15.0 g/dL   HCT 16.1 (*) 09.6 - 04.5 %   MCV 89.4  78.0 - 100.0 fL   MCH 27.8  26.0 - 34.0 pg   MCHC 31.1  30.0 - 36.0 g/dL   RDW 40.9 (*) 81.1 - 91.4 %   Platelets 214  150 - 400 K/uL  GLUCOSE, CAPILLARY     Status: Abnormal   Collection Time   09/02/12  4:37 PM      Component Value Range   Glucose-Capillary 100 (*) 70 - 99 mg/dL  BASIC METABOLIC PANEL     Status: Abnormal   Collection Time   09/03/12  5:41 AM      Component Value Range   Sodium 137  135 - 145 mEq/L   Potassium 4.0  3.5 - 5.1 mEq/L   Chloride 97  96 - 112 mEq/L   CO2 31  19 - 32 mEq/L   Glucose, Bld 90  70 - 99 mg/dL   BUN 41 (*) 6 - 23 mg/dL   Creatinine, Ser 7.82 (*) 0.50 - 1.10 mg/dL   Calcium 9.2  8.4 - 95.6 mg/dL   GFR calc non Af Amer 24 (*) >90 mL/min   GFR calc Af Amer 28 (*) >90 mL/min    Intake/Output Summary (Last 24 hours) at 09/03/12 0728 Last data filed at 09/03/12 0656  Gross per 24 hour  Intake    140 ml  Output   1851  ml  Net  -1711 ml   ASSESSMENT AND PLAN:  1. Acute on chronic CHF:   Wt is down 3 lb.  She has lost 6 liters this admit.  I will check a BMET and proBNP tomorrow.  Switch to PO diurtetic today.    2. Ischemic Cardiomyopathy, LVEF 35-40% by echo 10/01/2011, NYHA class 4 :  As above  3. CAD s/p CABG in 2004 (grafts patent by cath 02/2010) :  As above. No evidence for ischemia.    4. Chronic Kidney Disease (stage 4):  Stable.  Follow in AM  5. Anemia:  No active sign of bleeding.  Hbg stable.  6. UTI:  This was treated in the hospital at the last appt.  Cultures this admit are negative this  admit.  I will try to discontinue the Foley again today.    7.  Disposition:  She might be able to go to a nursing home in the am.  I will consult PT  Rollene Rotunda 09/03/2012 7:28 AM

## 2012-09-03 NOTE — Clinical Social Work Placement (Addendum)
    Clinical Social Work Department CLINICAL SOCIAL WORK PLACEMENT NOTE 09/03/2012  Patient:  [name] [name], [name] [name]  Account Number:  [id number] Admit date:  08/30/2012  Clinical Social Worker:  Lupita Leash Corley Maffeo, LCSWA  Date/time:  09/03/2012 03:07 PM  Clinical Social Work is seeking post-discharge placement for this patient at the following level of care:   Skilled [employer]  (*CSW will update this form in Epic as items are completed)   09/03/2012  Patient/family provided with [facility] Department of Clinical Social Work's list of facilities offering this level of care within the geographic area requested by the patient (or if unable, by the patient's family).  09/03/2012  Patient/family informed of their freedom to choose among providers that offer the needed level of care, that participate in Medicare, Medicaid or managed care program needed by the patient, have an available bed and are willing to accept the patient.  09/03/2012  Patient/family informed of [facility]' ownership interest in [facility], as well as of the fact that they are under no obligation to receive care at this facility.  PASARR submitted to EDS on 09/03/12 PASARR number received from EDS on 09/03/12  FL2 transmitted to all facilities in geographic area requested by pt/family on  09/03/2012 FL2 transmitted to all facilities within larger geographic area on   Patient informed that his/her managed care company has contracts with or will negotiate with  certain facilities, including the following:   Has [employer]- a Medicare advantage progrram  Has existing PASARR in place.     Patient/family informed of bed offers received:  09/04/12 Patient chooses bed at  [facility]   Physician recommends and patient chooses bed at    Patient to be transferred to [facility]  on  09/05/12 Patient to be transferred to facility by Ambulance  [facility])  The following physician request were entered in  Epic:   Additional Comments: DC today to SNF at [facility]. Palliative Care referral to [facility] for follow up.  Daughters Vickie and Pam are aware and are pleased with d/c plan. Patient and nursing notified of d/c plan. Patient is agreeable to plan as daughter's have requested.   No further CSW needs identified.    CSW signing off.  Lorri Frederick. West Pugh  [phone]

## 2012-09-03 NOTE — Progress Notes (Signed)
Bed offers given to patient's daughter Larene Beach.  [facility] called and stated they will not have a vacancy this week.  Daughter will check on other offers and call CSW in the a.m.  Facility will need to start insurance authorization.  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  CSW will notify chosen facility of this.  Lorri Frederick. [place] Pugh  [phone]

## 2012-09-03 NOTE — Progress Notes (Signed)
Patient ZO:XWRUEAVW E Dobies      DOB: 10-Jan-1925      UJW:[id number]   Palliative Medicine Team at [facility] Progress Note    Subjective:Patient sitting up in bed, respirations unlabored. She states that SOB is much improved with increased Xanax dosing.   Filed Vitals:   09/03/12 0859  BP: 114/62  Pulse: 73  Temp: 98.6 F (37 C)  Resp: 20   Physical exam: General: pleasant elderly female, in NAD HEENT: anicteric, buccal mucosa moist, oxygen on via n/c CHEST: fine bibasilar rales, increased on R side CVS: RRR ABD: soft, non-tender, BS audible EXT: no pedal edema   Assessment and plan: Patient is 79 yo WF,with PMH CAD (s/p CABG 2004), CKD (stage 4),Ichemic Cardiomyopathy (LVEF 35-40% 10/01/11), NYHA class 4,recently admitted on 08/14/12 with c/o chest pain ( r/o MI-pain felt to be non-cardiac) discharged 08/29/12. Readmitted on 08/30/12 with SOB,requiring diuresis.   1) Anxiety/Dyspnea: controlled with scheduled Norco and as needed Xanax  2) Pain: shoulder pain, controlled with current regime of pain medication  3) Disposition: recommend disposition to SNF with Palliative care services to follow  Time In Time Out Total Time Spent with Patient Total Overall Time  8:30a 8:50a 20 min 20 min    Freddie Breech, CNS-C Palliative Medicine Team [facility] Team Phone: [phone] Pager: [phone]

## 2012-09-03 NOTE — Progress Notes (Deleted)
Patient trasported back to room 4728 after having dialysis. Patient is awake and alert and oriented. VSS and patient asking for dinner tray. Will continue to monitor to end of shift.

## 2012-09-03 NOTE — Telephone Encounter (Signed)
New problem:   Per [employer] card master called this am to R.S appt with Ward Givens from 1/23 @ 9:30 & lab work @ 10:00  To Monday appt 1/27 . Call the home spoke with the daughter, stated that her mother was still in the hospital -  will be discharge to a skilled facility when she leaves the hospital . To cancel Both appt.   Page [employer] card master [phone] inform her of the situation. Suggest -  send a message to Dr. Antoine Poche &  nurse for review.

## 2012-09-04 ENCOUNTER — Other Ambulatory Visit: Payer: Medicare (Managed Care)

## 2012-09-04 ENCOUNTER — Telehealth: Payer: Self-pay | Admitting: Cardiology

## 2012-09-04 ENCOUNTER — Encounter: Payer: Medicare (Managed Care) | Admitting: Nurse Practitioner

## 2012-09-04 LAB — PRO B NATRIURETIC PEPTIDE: Pro B Natriuretic peptide (BNP): 7008 pg/mL — ABNORMAL HIGH (ref 0–450)

## 2012-09-04 LAB — BASIC METABOLIC PANEL
CO2: 23 mEq/L (ref 19–32)
Chloride: 95 mEq/L — ABNORMAL LOW (ref 96–112)
GFR calc Af Amer: 36 mL/min — ABNORMAL LOW (ref >90)
Potassium: 4.8 mEq/L (ref 3.5–5.1)

## 2012-09-04 MED ORDER — HYDROCODONE-ACETAMINOPHEN 10-325 MG PO TABS
1.0000 | ORAL_TABLET | Freq: Four times a day (QID) | ORAL | Status: DC
  Administered 2012-09-04 – 2012-09-05 (×5): 1 via ORAL
  Filled 2012-09-04 (×5): qty 1

## 2012-09-04 NOTE — Progress Notes (Signed)
SUBJECTIVE:  She is very fatigued today and complaining again about her left shoulder hurting.    PHYSICAL EXAM Filed Vitals:   09/03/12 1436 09/03/12 1838 09/03/12 2106 09/04/12 0353  BP: 119/64 112/68 114/66 123/71  Pulse: 69 69 70 73  Temp:  98.8 F (37.1 C) 98.7 F (37.1 C) 98.5 F (36.9 C)  TempSrc:  Axillary Oral Oral  Resp: 20  20 20   Height:      Weight:    110 lb 14.4 oz (50.304 kg)  SpO2: 98%  97% 97%   General:  No acute distress but frail. HEENT: PERRL, no erythema Lungs:  Few basilar crackles Heart:  RRR Abdomen:  Positive bowel sounds, no rebound no guarding. Extremities:  No edema Neuro:  Nonfocal  LABS:  Results for orders placed during the hospital encounter of 08/30/12 (from the past 24 hour(s))  BASIC METABOLIC PANEL     Status: Abnormal   Collection Time   09/04/12  6:10 AM      Component Value Range   Sodium 134 (*) 135 - 145 mEq/L   Potassium 4.8  3.5 - 5.1 mEq/L   Chloride 95 (*) 96 - 112 mEq/L   CO2 23  19 - 32 mEq/L   Glucose, Bld 88  70 - 99 mg/dL   BUN 38 (*) 6 - 23 mg/dL   Creatinine, Ser 8.29 (*) 0.50 - 1.10 mg/dL   Calcium 9.3  8.4 - 56.2 mg/dL   GFR calc non Af Amer 31 (*) >90 mL/min   GFR calc Af Amer 36 (*) >90 mL/min  PRO B NATRIURETIC PEPTIDE     Status: Abnormal   Collection Time   09/04/12  6:10 AM      Component Value Range   Pro B Natriuretic peptide (BNP) 7008.0 (*) 0 - 450 pg/mL    Intake/Output Summary (Last 24 hours) at 09/04/12 0837 Last data filed at 09/04/12 0409  Gross per 24 hour  Intake    723 ml  Output   1000 ml  Net   -277 ml   ASSESSMENT AND PLAN:  1. Acute on chronic CHF:   She has had a 3 to 4 lb weight loss this admit and this is the lowest weight I have seen in quite some time.  Creat is actually down.  I think that I can continue the current oral diuretic.    2. Ischemic Cardiomyopathy, LVEF 35-40% by echo 10/01/2011, NYHA class 4 :  As above  3. CAD s/p CABG in 2004 (grafts patent by cath  02/2010) :  As above. No evidence for ischemia.    4. Chronic Kidney Disease (stage 4):  Creat is actually down to 1.47.  Continue current therapy.   5. Anemia:  No active sign of bleeding.  Hbg stable this admission.  6. UTI:  Foley out.    7.  Disposition:  PT is working with the patient.  We are waiting for bed placement.  There is a comment of VRE positivity.  However, her urine culture this admit was negative.  We are clarifying with infection control to make sure that she can come off of contact isolation which would facilitate placement.  I am going to stop her Norco to q6 hours to try to reduce her somnolence.  I will ask Palliative Care to comment further on pain management.  She has had chronic left shoulder pain and receives injections from her primary provider.    Rollene Rotunda 09/04/2012 8:37  AM

## 2012-09-04 NOTE — Progress Notes (Signed)
In reference to Extended Spectrum Beta Lactamase, that was previously in the urine, in which urine culture result is negative, Infection Control nurse explained that due to a very long colonization of bacteria and the fact of the bacteria producing enzymes that cause it to be extremely resistant to antibiotics, patient will have to remain on contact precautions (orange) for life. As far as going to a SNF, it is up to their protocol whether they will accept the patient or not which can be an issue for discharge. Explained to Child psychotherapist and will follow-up. Also daughter Larene Beach called and explained to her of this rare bacteria and she understood.   When speaking with daughter Chip Boer, she asked how patient was and explained that MD will change the frequency of Hydrocodone for patient seemed a little out of it this morning. Daughter stated that was fine and the fact that she has been on Hydrocodone for years. At this time patient is resting in bed. Will continue to monitor as needed to end of shift.

## 2012-09-04 NOTE — Telephone Encounter (Signed)
Pt's daughter wants to talk to Dr. Antoine Poche about her inpatient mother.  Advised pt that Dr. Antoine Poche is in the hospital today and she should have the nurse there page him.

## 2012-09-04 NOTE — Progress Notes (Signed)
Utilization Review Completed.   Lashae Wollenberg, RN, BSN Nurse Case Manager  [phone]  

## 2012-09-04 NOTE — Progress Notes (Signed)
Patient ZO:[id number] E Liford      DOB: September 14, 1924      UJW:[id number]   Palliative Medicine Team at [facility] Progress Note    Subjective:Patient noted to be notably lethargic this morning, Dr Antoine Poche called PMT felt somulence may be related to scheduled dosing of Vicodin 10/325 every 4 hours, requested PMT to evaluate an recommend pain management for chronic L shoulder pain related to rotator cuff injury. Patient stated she did not sleep well last night attributing lethargy to this. Has been taking Vicodin every 4 hours chronically for past 5 years, also had intermittent joint injections that relieved pain.   Filed Vitals:   09/04/12 0851  BP: 114/68  Pulse: 70  Temp: 98.7 F (37.1 C)  Resp: 20   Physical exam:  General: Semi-lethargic, but arousable and verbal responses appropriate HEENT: conjunctivae clear, buccal mucosa moist CHEST: CTA, diminished at bases CVS:RRR ABD: soft, non-tender, BS audible EXT: pain 8/10 at anterior L shoulder joint, Lidoderm patch present  NEURO: oriented, but sleepy   Assessment and plan:  1) L shoulder pain: Norco 10/325 every six hours, Lidoderm patch to area      Attempted to get orthopedicservice to do joint injection during this hospital stay was unable to get this arranged. Spoke with daughters and with Dr Tommye Standard office [phone] office visit will be arranged after discharge to SNF.   2) Disposition plan discharge to SNF with Palliative Care services to follow   Time In Time Out Total Time Spent with Patient Total Overall Time  9:30a 10:00a 30 min 30 min   Freddie Breech, CNS-C Palliative Medicine Team [facility] Team Phone: [phone] Pager: [phone]

## 2012-09-04 NOTE — Telephone Encounter (Signed)
Pt is in hospital and hospice has come and they suggested to lower pain meds and daughter wants to talk someone about this

## 2012-09-05 ENCOUNTER — Inpatient Hospital Stay
Admission: RE | Admit: 2012-09-05 | Discharge: 2012-11-23 | Disposition: A | Discharge status: Other Acute Inpatient Hospital | Payer: Medicare (Managed Care) | Source: Ambulatory Visit | Attending: Internal Medicine | Admitting: Internal Medicine

## 2012-09-05 ENCOUNTER — Telehealth: Payer: Self-pay | Admitting: *Deleted

## 2012-09-05 DIAGNOSIS — M25512 Pain in left shoulder: Principal | ICD-10-CM

## 2012-09-05 DIAGNOSIS — E039 Hypothyroidism, unspecified: Secondary | ICD-10-CM

## 2012-09-05 MED ORDER — PANTOPRAZOLE SODIUM 40 MG PO TBEC: 40.0000 mg | DELAYED_RELEASE_TABLET | Freq: Every day | ORAL | Status: DC

## 2012-09-05 MED ORDER — HYDROCODONE-ACETAMINOPHEN 10-325 MG PO TABS: 1.0000 | ORAL_TABLET | Freq: Four times a day (QID) | ORAL | Status: DC

## 2012-09-05 MED ORDER — ACETAMINOPHEN 325 MG PO TABS: 650.0000 mg | ORAL_TABLET | ORAL | Status: DC | PRN

## 2012-09-05 MED ORDER — ALUM & MAG HYDROXIDE-SIMETH 200-200-20 MG/5ML PO SUSP: 30.0000 mL | ORAL | Status: DC | PRN

## 2012-09-05 MED ORDER — LACTULOSE 10 GM/15ML PO SOLN: 20.0000 g | Freq: Two times a day (BID) | ORAL | Status: DC | PRN

## 2012-09-05 MED ORDER — LIDOCAINE 5 % EX PTCH: 1.0000 | MEDICATED_PATCH | CUTANEOUS | Status: DC

## 2012-09-05 MED ORDER — SENNOSIDES-DOCUSATE SODIUM 8.6-50 MG PO TABS: 1.0000 | ORAL_TABLET | Freq: Two times a day (BID) | ORAL | Status: DC

## 2012-09-05 MED ORDER — ALPRAZOLAM 0.5 MG PO TABS: 0.5000 mg | ORAL_TABLET | Freq: Three times a day (TID) | ORAL | Status: DC | PRN

## 2012-09-05 MED ORDER — ATORVASTATIN CALCIUM 10 MG PO TABS: 10.0000 mg | ORAL_TABLET | Freq: Every day | ORAL | Status: DC

## 2012-09-05 MED ORDER — FUROSEMIDE 40 MG PO TABS: 40.0000 mg | ORAL_TABLET | Freq: Two times a day (BID) | ORAL | Status: DC

## 2012-09-05 NOTE — Telephone Encounter (Signed)
Pt being d/ced from hospital 1/24 - call to follow up on Monday 1/27  She has follow up appt 09/17/2012 scheduled

## 2012-09-05 NOTE — Progress Notes (Signed)
CSW contacted Riki Rusk, [facility] to make Palliative Care referral for follow up at the [facility].  Per Shon Hale, they will not be able to provide services for patient at this time as she will be receiving Medicare coverage at the SNF. Once she is no longer covered by Medicare, the facility can contact them to initiate services. Verbal referral completed to [facility] so that they will have an initial file on patient.  Notified Charlann Lange, Doctor, hospital Nursing Center to contact patient [facility] once [employer] days are exhausted.  Lorri Frederick. [place] Pugh  [phone]

## 2012-09-05 NOTE — Progress Notes (Signed)
Patient GN:[id number] E Goto      DOB: 10/22/1924      VHQ:[id number]   Palliative Medicine Team at [facility] Progress Note    Subjective:Patient more alert this morning, but looks tired, respirations unlabored. L shoulder pain 1-2/10 presently, appetite and fluid intake good   Filed Vitals:   09/05/12 0953  BP: 101/64  Pulse: 71  Temp: 98.1 F (36.7 C)  Resp: 18   Physical exam:  General: Alert to verbal stimuli, in NAD HEENT: conjunctivae clear, mouth moist CHEST: CTA bilaterally, diminishes at bases CVS: RRR ABD: soft, non-tender, BS audible  EXT: no pedal edema  Assessment and plan:  1) Code Status: DNR/DNI 2) Pain L shoulder -controlled with present regime scheduled Vicodin, Lidoderm patch 3)Dyspnea/Anxiety: using Xanax as needed sporadically 4) Disposition: awaiting SNF for placement  Time In Time Out Total Time Spent with Patient Total Overall Time  10:40a 11:00a     Freddie Breech, CNS-C Palliative Medicine Team [place] Team Phone: [phone] Pager: [phone]

## 2012-09-05 NOTE — Progress Notes (Signed)
   SUBJECTIVE:  She is a little less fatigued today and having a little less shoulder pain.     PHYSICAL EXAM Filed Vitals:   09/04/12 1500 09/04/12 1707 09/04/12 2300 09/05/12 0220  BP: 103/62 114/71 108/73 110/68  Pulse: 68 70 68 66  Temp: 99.5 F (37.5 C)  98.6 F (37 C) 98.2 F (36.8 C)  TempSrc:   Oral Axillary  Resp: 20  20 18   Height:      Weight:    110 lb 4 oz (50.01 kg)  SpO2: 97%  97% 97%   General:  No acute distress but frail, mentating well Lungs:  Few basilar crackles Heart:  RRR Abdomen:  Positive bowel sounds, no rebound no guarding. Extremities:  No edema   LABS:  No results found for this or any previous visit (from the past 24 hour(s)).  Intake/Output Summary (Last 24 hours) at 09/05/12 0749 Last data filed at 09/04/12 1700  Gross per 24 hour  Intake    480 ml  Output    500 ml  Net    -20 ml   ASSESSMENT AND PLAN:  1. Acute on chronic CHF:   She has had a 3 to 4 lb weight loss this admit and is stable today.  I/O even.  Creat has been stable.  I will follow this.    2. Ischemic Cardiomyopathy, LVEF 35-40% by echo 10/01/2011, NYHA class 4 :  As above  3. CAD s/p CABG in 2004 (grafts patent by cath 02/2010) :  As above. No evidence for ischemia.    4. Chronic Kidney Disease (stage 4):  I will follow this closely with repeat labs.    5. Anemia:  No active sign of bleeding.  6.  Disposition:  She is ready to go from a medical standpoint whenever a place is available.  She cannot get a shoulder injection as an in patient we found out.  She needs to remain on contact isolation although she does not have an active urine culture.    [name] [facility] 09/05/2012 7:49 AM

## 2012-09-05 NOTE — Discharge Summary (Signed)
CARDIOLOGY DISCHARGE SUMMARY   Patient ID: PRINCESS KARNES MRN: [id number] DOB/AGE: 10-31-1924 79 y.o.  Admit date: 08/30/2012 Discharge date: 09/05/2012  Primary Discharge Diagnosis:   *Acute on chronic systolic CHF (congestive heart failure), NYHA class 4   Secondary Discharge Diagnosis:   CKD (chronic kidney disease), stage IV  DNR (do not resuscitate)  Anemia, B12 deficiency  Hypothyroidism  DOE (dyspnea on exertion)  Anxiety  Pain  Consults: Palliative care, physical therapy, care management  Procedures: Two-view chest x-ray  Hospital Course: Ms. Brasil is an 79 year old female with a history of heart failure. She was hospitalized from 1/12 through 08/29/2012 and had increasing shortness of breath after discharge. She came back to the hospital on 08/30/2012 and was admitted for further evaluation and treatment.  Her chest x-ray did not have any new edema but her BNP was significantly elevated although not higher than previous values. She was hypoxic with increased respiratory rate and increased work of breathing. She was started on IV Lasix at 40 mg twice a day. She was continued on her other home medications including amiodarone, nitrates and a beta blocker. Her renal function was followed closely during her stay. She had an increase in her creatinine with diuresis to a peak of 38/2.12. However, her Lasix dose was adjusted and then changed to by mouth. At discharge her BUN was 38 with a creatinine of 1.47. Based on her volume status, this is an acceptable level. She has chronic kidney disease stage III and her GFR is 31 at discharge.  She has had problems with urinary retention in the past. Initially there was difficulty in placing the Foley but they were able to get one and her intake/output as well as daily weights were followed during her hospital stay. Her weight at discharge is 110 pounds, which is the lowest it has been since October of 2013.Marland Kitchen  Prior to admission, she was  alternating 2 weeks days with her 2 daughters. The situation was discussed with the patient and her daughters on admission. She had an out-of-hospital DO NOT RESUSCITATE form and an in-hospital DO NOT RESUSCITATE was requested. This was ordered. Additionally, the daughters state that her care needs were becoming too great for them to manage her at home and requested assessment for possible placement. The care management consult was called and physical therapy evaluated her. It was felt that she needed continued PT services and skilled nursing facility placement was recommended since the family can no longer provide 24 hour assistance. Upon further discussions with physicians, the decision was made to call palliative care consult. She was seen and followed by the palliative care team during the rest of her stay. Goals of care were clarified and symptom management was pursued. Initially, there was a problem with possible over sedation from narcotics and these were decreased. She will be continued on scheduled narcotics at the current doses for chronic pain issues. Xanax was also used on a when necessary basis.  She has a torn rotator cuff and has chronic pain issues from this. Narcotic doses were adjusted downward for less sedation and a lidocaine patch was used to provide more relief. She is to followup with orthopedics as an outpatient for further treatment and management.  She was treated for a UTI during her previous admission. A urine culture was repeated because of the reinsertion of a Foley catheter. The cultures again were negative and the Foley was successfully discontinued once she no longer needed IV Lasix.  By 09/05/2012,  her pain issues or under improved control and her respiratory status was at baseline. The family accepted a bed at a skilled nursing facility in [place]. Both Dr. Antoine Poche and Ms. Moore from palliative care and evaluated Ms. Rigel and considered her stable for discharge to  a skilled nursing facility, with outpatient followup arranged.  Labs:   Lab Results  Component Value Date   WBC 5.6 09/02/2012   HGB 9.2* 09/02/2012   HCT 29.6* 09/02/2012   MCV 89.4 09/02/2012   PLT 214 09/02/2012     Lab 09/04/12 0610 08/30/12 1335  NA 134* --  K 4.8 --  CL 95* --  CO2 23 --  BUN 38* --  CREATININE 1.47* --  CALCIUM 9.3 --  PROT -- 7.3  BILITOT -- 0.5  ALKPHOS -- 64  ALT -- 7  AST -- 13  GLUCOSE 88 --    Pro B Natriuretic peptide (BNP)  Date/Time Value Range Status  09/04/2012  6:10 AM 7008.0* 0 - 450 pg/mL Final  08/30/2012  1:23 PM 19433.0* 0 - 450 pg/mL Final      Radiology: Dg Chest 2 View 08/30/2012  *RADIOLOGY REPORT*  Clinical Data: Chest pain in the epigastric region  CHEST - 2 VIEW  Comparison: Chest radiograph 08/24/2012 and 08/27/2012  Findings: Patient is rotated to the right.  Chronic moderate cardiomegaly is stable.  Prior CABG.  Left chest wall dual lead pacer is present.  Pulmonary vascularity appears within normal limits.  No airspace disease, effusion, or pneumothorax is identified.  The bones are diffusely osteopenic.  Remote left humeral neck fracture appears unchanged.  IMPRESSION: Stable cardiomegaly.  No acute cardiopulmonary disease identified.   Original Report Authenticated By: Britta Mccreedy, M.D.    EKG: 30-Aug-2012 13:02:45 SINUS RHYTHM ~ normal P axis, V-rate 50- 99 LVH WITH IVCD, LAD AND SECONDARY REPOL ABNRM ~ RISIII/[id number], wQRS, LAD, rep abn Electronic ventricular pacemaker No significant change since last tracing Abnormal ekg Vent. rate 73 BPM PR interval 204 ms QRS duration 192 ms QT/QTc 516/569 ms P-R-T axes 74 -76 98  FOLLOW UP PLANS AND APPOINTMENTS Allergies  Allergen Reactions  . Altace (Ramipril) Hives  . Contrast Media (Iodinated Diagnostic Agents) Other (See Comments)    Reaction noted by md-unknown reaction per family  . Heparin Other (See Comments)    Clots blood instead of thinning  . Hydromorphone Hcl  Other (See Comments)    Crazy feeling     Medication List     As of 09/05/2012 12:49 PM    STOP taking these medications         omeprazole 20 MG capsule   Commonly known as: PRILOSEC   Replaced by: pantoprazole 40 MG tablet      polyethylene glycol packet   Commonly known as: MIRALAX / GLYCOLAX      rosuvastatin 5 MG tablet   Commonly known as: CRESTOR   Replaced by: atorvastatin 10 MG tablet      TAKE these medications         acetaminophen 325 MG tablet   Commonly known as: TYLENOL   Take 2 tablets (650 mg total) by mouth every 4 (four) hours as needed for pain or fever.      ALPRAZolam 0.5 MG tablet   Commonly known as: XANAX   Take 1 tablet (0.5 mg total) by mouth 3 (three) times daily as needed for sleep or anxiety (Offer at bedtime and may have 2 other times daily as needed.).  alum & mag hydroxide-simeth 200-200-20 MG/5ML suspension   Commonly known as: MAALOX/MYLANTA   Take 30 mLs by mouth every 4 (four) hours as needed for indigestion.      amiodarone 100 MG tablet   Commonly known as: PACERONE   Take 100 mg by mouth at bedtime.      aspirin EC 81 MG tablet   Take 1 tablet (81 mg total) by mouth daily.      atorvastatin 10 MG tablet   Commonly known as: LIPITOR   Take 1 tablet (10 mg total) by mouth daily at 6 PM.      carvedilol 12.5 MG tablet   Commonly known as: COREG   Take 1 tablet (12.5 mg total) by mouth 2 (two) times daily with a meal.      docusate sodium 100 MG capsule   Commonly known as: COLACE   Take 2 capsules (200 mg total) by mouth 2 (two) times daily.      furosemide 40 MG tablet   Commonly known as: LASIX   Take 1 tablet (40 mg total) by mouth 2 (two) times daily.      HYDROcodone-acetaminophen 10-325 MG per tablet   Commonly known as: NORCO   Take 1 tablet by mouth every 6 (six) hours.      isosorbide mononitrate 30 MG 24 hr tablet   Commonly known as: IMDUR   Take 1 tablet (30 mg total) by mouth daily.      lactulose  10 GM/15ML solution   Commonly known as: CHRONULAC   Take 30 mLs (20 g total) by mouth 2 (two) times daily as needed.      levothyroxine 50 MCG tablet   Commonly known as: SYNTHROID, LEVOTHROID   Take 50 mcg by mouth at bedtime.      lidocaine 5 %   Commonly known as: LIDODERM   Place 1 patch onto the skin daily. Remove & Discard patch within 12 hours or as directed by MD      nitroGLYCERIN 0.4 MG SL tablet   Commonly known as: NITROSTAT   Place 0.4 mg under the tongue every 5 (five) minutes as needed. For chest pain      pantoprazole 40 MG tablet   Commonly known as: PROTONIX   Take 1 tablet (40 mg total) by mouth daily.      senna-docusate 8.6-50 MG per tablet   Commonly known as: Senokot-S   Take 1 tablet by mouth 2 (two) times daily.      Tamsulosin HCl 0.4 MG Caps   Commonly known as: FLOMAX   Take 1 capsule (0.4 mg total) by mouth daily.      VITAMIN B-12 IJ   Inject 1,000 mcg as directed every 14 (fourteen) days. Administered at MD office      Vitamin D (Ergocalciferol) 50000 UNITS Caps   Commonly known as: DRISDOL   Take 50,000 Units by mouth every 7 (seven) days. Takes on Wed          Discharge Orders    Future Appointments: Provider: Department: Dept Phone: Center:   09/17/2012 2:00 PM Rollene Rotunda, MD Selena Batten at [place] [phone] [facility]     Follow-up Information    Follow up with Rollene Rotunda, MD. On 09/17/2012. (at 2:00 pm.)    Contact information:   [address] [place], [place] [address] [phone]          BRING ALL MEDICATIONS WITH YOU TO FOLLOW UP  APPOINTMENTS  Time spent with patient to include physician time: 48 min Signed: Theodore Demark 09/05/2012, 12:49 PM  Patient seen by Dr. Antoine Poche on day of discharge. (see his daily progress note from 1/24 for full details). I am signing this d/c summary for him. I have reviewed details and it appears correct.  Truman Hayward 6:29  PM

## 2012-09-08 NOTE — Telephone Encounter (Signed)
Called and spoke with daughter as pt is now in a nursing facility.  Per daughter as far as she know pt has everything she needs and has  No questions about medication or treatments.  She will follow as 2/5 in the [place] office.

## 2012-09-15 ENCOUNTER — Telehealth: Payer: Self-pay | Admitting: Cardiology

## 2012-09-15 NOTE — Telephone Encounter (Signed)
New Problem     Pts daughter is requesting to speak to Dr. Antoine Poche. States mother is in bad shape. Nursing facility is not getting proper medication to her. In stage 4 kidney failure and sever pain. Wanted to see if Dr. Antoine Poche can help her get what she needs. Please call back.

## 2012-09-15 NOTE — Telephone Encounter (Signed)
Spoke to patient's daughter, Lucienne Minks. Informed her that Dr. Antoine Poche not in office today but will be in office tomorrow and in [place] on Wednesday. Will forward this message to Dr. Antoine Poche and Avie Arenas. She states that she is unavailable tomorrow (Tuesday) but will be available to take a phone call on Wednesday in the afternoon.

## 2012-09-17 ENCOUNTER — Ambulatory Visit: Payer: Medicare Other | Admitting: Cardiology

## 2012-09-17 NOTE — Telephone Encounter (Signed)
Dr Antoine Poche spoke with daughter in the office.  Pt was taken to the wrong office to be seen.  Should have come to the [place] office.

## 2012-09-24 NOTE — Consult Note (Signed)
Agree with above 

## 2012-10-16 ENCOUNTER — Telehealth: Payer: Self-pay | Admitting: Cardiology

## 2012-10-16 NOTE — Telephone Encounter (Signed)
Left message to call back  

## 2012-10-16 NOTE — Telephone Encounter (Signed)
Follow Up ° ° ° °Pt returning phone call from earlier. °

## 2012-10-16 NOTE — Telephone Encounter (Signed)
New Problem   Pt's daughter stated [facility]/[phone] is ready to release pt into an apartment with no furniture no nothing and she can't take care of herself and she stated her mom is not ready to go out on her own. Pt's daughter is very concerned and upset with this decision and would like to speak to a nurse concerning this matter

## 2012-10-20 NOTE — Telephone Encounter (Signed)
Will forward to [facility] for follow up.

## 2012-10-21 NOTE — Telephone Encounter (Signed)
Spoke with Chip Boer (daughter) who states pt is confused and she is not being moved into an apt.  Pt made the story up and she will remain at [facility].

## 2012-10-24 ENCOUNTER — Ambulatory Visit (HOSPITAL_COMMUNITY)
Admit: 2012-10-24 | Discharge: 2012-10-24 | Disposition: A | Discharge status: Home / Self Care | Payer: Medicare Other | Attending: Internal Medicine | Admitting: Internal Medicine

## 2012-10-24 DIAGNOSIS — M25519 Pain in unspecified shoulder: Secondary | ICD-10-CM | POA: Insufficient documentation

## 2012-11-03 ENCOUNTER — Other Ambulatory Visit: Payer: Self-pay | Admitting: *Deleted

## 2012-11-03 MED ORDER — ALPRAZOLAM 0.5 MG PO TABS: ORAL_TABLET | ORAL | Status: DC

## 2012-11-07 ENCOUNTER — Telehealth: Payer: Self-pay | Admitting: Cardiology

## 2012-11-07 NOTE — Telephone Encounter (Signed)
New Problem:    Called in wanting to know if the patient still needed to come in for her appointment on 11/12/12.  Patient stated that her daughter told her she did not have to come in.  Please call back and feel free to speak with [facility]

## 2012-11-07 NOTE — Telephone Encounter (Signed)
Pt does not need to be seen per Dr Antoine Poche - appt cancelled Kathie Rhodes at [facility] is aware.

## 2012-11-12 ENCOUNTER — Ambulatory Visit: Payer: Medicare Other | Admitting: Cardiology

## 2012-11-23 ENCOUNTER — Emergency Department (HOSPITAL_COMMUNITY): Payer: Medicare Other

## 2012-11-23 ENCOUNTER — Inpatient Hospital Stay (HOSPITAL_COMMUNITY)
Admission: EM | Admit: 2012-11-23 | Discharge: 2012-11-27 | DRG: 303 | Disposition: A | Discharge status: Skilled Nursing Facility | Payer: Medicare Other | Attending: Cardiology | Admitting: Cardiology

## 2012-11-23 ENCOUNTER — Encounter (HOSPITAL_COMMUNITY): Payer: Self-pay | Admitting: Emergency Medicine

## 2012-11-23 DIAGNOSIS — L270 Generalized skin eruption due to drugs and medicaments taken internally: Secondary | ICD-10-CM | POA: Diagnosis present

## 2012-11-23 DIAGNOSIS — I5022 Chronic systolic (congestive) heart failure: Secondary | ICD-10-CM | POA: Diagnosis present

## 2012-11-23 DIAGNOSIS — E039 Hypothyroidism, unspecified: Secondary | ICD-10-CM | POA: Diagnosis present

## 2012-11-23 DIAGNOSIS — R079 Chest pain, unspecified: Secondary | ICD-10-CM

## 2012-11-23 DIAGNOSIS — I2589 Other forms of chronic ischemic heart disease: Secondary | ICD-10-CM | POA: Diagnosis present

## 2012-11-23 DIAGNOSIS — Z950 Presence of cardiac pacemaker: Secondary | ICD-10-CM

## 2012-11-23 DIAGNOSIS — T4595XA Adverse effect of unspecified primarily systemic and hematological agent, initial encounter: Secondary | ICD-10-CM | POA: Diagnosis present

## 2012-11-23 DIAGNOSIS — I200 Unstable angina: Secondary | ICD-10-CM

## 2012-11-23 DIAGNOSIS — M1990 Unspecified osteoarthritis, unspecified site: Secondary | ICD-10-CM | POA: Diagnosis present

## 2012-11-23 DIAGNOSIS — N184 Chronic kidney disease, stage 4 (severe): Secondary | ICD-10-CM | POA: Diagnosis present

## 2012-11-23 DIAGNOSIS — I2510 Atherosclerotic heart disease of native coronary artery without angina pectoris: Principal | ICD-10-CM | POA: Diagnosis present

## 2012-11-23 DIAGNOSIS — Z9861 Coronary angioplasty status: Secondary | ICD-10-CM

## 2012-11-23 DIAGNOSIS — K219 Gastro-esophageal reflux disease without esophagitis: Secondary | ICD-10-CM | POA: Diagnosis present

## 2012-11-23 DIAGNOSIS — Z66 Do not resuscitate: Secondary | ICD-10-CM | POA: Diagnosis present

## 2012-11-23 DIAGNOSIS — N390 Urinary tract infection, site not specified: Secondary | ICD-10-CM | POA: Diagnosis present

## 2012-11-23 DIAGNOSIS — R21 Rash and other nonspecific skin eruption: Secondary | ICD-10-CM | POA: Diagnosis not present

## 2012-11-23 DIAGNOSIS — J449 Chronic obstructive pulmonary disease, unspecified: Secondary | ICD-10-CM | POA: Diagnosis present

## 2012-11-23 DIAGNOSIS — I10 Essential (primary) hypertension: Secondary | ICD-10-CM | POA: Diagnosis present

## 2012-11-23 DIAGNOSIS — Z951 Presence of aortocoronary bypass graft: Secondary | ICD-10-CM

## 2012-11-23 DIAGNOSIS — J4489 Other specified chronic obstructive pulmonary disease: Secondary | ICD-10-CM | POA: Diagnosis present

## 2012-11-23 DIAGNOSIS — I208 Other forms of angina pectoris: Secondary | ICD-10-CM

## 2012-11-23 DIAGNOSIS — E119 Type 2 diabetes mellitus without complications: Secondary | ICD-10-CM | POA: Diagnosis present

## 2012-11-23 DIAGNOSIS — E785 Hyperlipidemia, unspecified: Secondary | ICD-10-CM | POA: Diagnosis present

## 2012-11-23 DIAGNOSIS — D649 Anemia, unspecified: Secondary | ICD-10-CM | POA: Diagnosis present

## 2012-11-23 DIAGNOSIS — I509 Heart failure, unspecified: Secondary | ICD-10-CM | POA: Diagnosis present

## 2012-11-23 DIAGNOSIS — I2089 Other forms of angina pectoris: Secondary | ICD-10-CM | POA: Diagnosis present

## 2012-11-23 DIAGNOSIS — I209 Angina pectoris, unspecified: Secondary | ICD-10-CM | POA: Diagnosis present

## 2012-11-23 DIAGNOSIS — G2581 Restless legs syndrome: Secondary | ICD-10-CM | POA: Diagnosis present

## 2012-11-23 LAB — BASIC METABOLIC PANEL
BUN: 31 mg/dL — ABNORMAL HIGH (ref 6–23)
CO2: 26 mEq/L (ref 19–32)
Chloride: 92 mEq/L — ABNORMAL LOW (ref 96–112)
GFR calc Af Amer: 33 mL/min — ABNORMAL LOW (ref >90)
Glucose, Bld: 113 mg/dL — ABNORMAL HIGH (ref 70–99)
Potassium: 4 mEq/L (ref 3.5–5.1)

## 2012-11-23 LAB — CBC WITH DIFFERENTIAL/PLATELET
Basophils Relative: 1 % (ref 0–1)
HCT: 34.4 % — ABNORMAL LOW (ref 36.0–46.0)
Hemoglobin: 11.3 g/dL — ABNORMAL LOW (ref 12.0–15.0)
Lymphocytes Relative: 27 % (ref 12–46)
Lymphs Abs: 1.1 10*3/uL (ref 0.7–4.0)
Monocytes Relative: 7 % (ref 3–12)
Neutro Abs: 2.6 10*3/uL (ref 1.7–7.7)
Neutrophils Relative %: 64 % (ref 43–77)
RBC: 4.08 MIL/uL (ref 3.87–5.11)
WBC: 4.1 10*3/uL (ref 4.0–10.5)

## 2012-11-23 LAB — PRO B NATRIURETIC PEPTIDE: Pro B Natriuretic peptide (BNP): 6161 pg/mL — ABNORMAL HIGH (ref 0–450)

## 2012-11-23 LAB — MRSA PCR SCREENING: MRSA by PCR: NEGATIVE

## 2012-11-23 LAB — TROPONIN I: Troponin I: <0.3 ng/mL (ref <0.30)

## 2012-11-23 MED ORDER — NITROGLYCERIN 2 % TD OINT
1.0000 [in_us] | TOPICAL_OINTMENT | Freq: Once | TRANSDERMAL | Status: AC
  Administered 2012-11-23: 1 [in_us] via TOPICAL
  Filled 2012-11-23: qty 1

## 2012-11-23 MED ORDER — ONDANSETRON HCL 4 MG/2ML IJ SOLN
4.0000 mg | Freq: Three times a day (TID) | INTRAMUSCULAR | Status: DC | PRN
  Administered 2012-11-23: 4 mg via INTRAVENOUS
  Filled 2012-11-23: qty 2

## 2012-11-23 MED ORDER — NITROGLYCERIN IN D5W 200-5 MCG/ML-% IV SOLN: 2.0000 ug/min | INTRAVENOUS | Status: DC

## 2012-11-23 MED ORDER — SODIUM CHLORIDE 0.9 % IV SOLN
INTRAVENOUS | Status: AC
  Administered 2012-11-23: 17:00:00 via INTRAVENOUS

## 2012-11-23 MED ORDER — MORPHINE SULFATE 2 MG/ML IJ SOLN: 2.0000 mg | Freq: Once | INTRAMUSCULAR | Status: AC

## 2012-11-23 MED ORDER — LEVOTHYROXINE SODIUM 75 MCG PO TABS
75.0000 ug | ORAL_TABLET | Freq: Every day | ORAL | Status: DC
  Administered 2012-11-23 – 2012-11-27 (×5): 75 ug via ORAL
  Filled 2012-11-23 (×5): qty 1

## 2012-11-23 MED ORDER — PANTOPRAZOLE SODIUM 40 MG PO TBEC
40.0000 mg | DELAYED_RELEASE_TABLET | Freq: Every day | ORAL | Status: DC
  Administered 2012-11-23 – 2012-11-27 (×5): 40 mg via ORAL
  Filled 2012-11-23 (×5): qty 1

## 2012-11-23 MED ORDER — CLOPIDOGREL BISULFATE 75 MG PO TABS
75.0000 mg | ORAL_TABLET | Freq: Every day | ORAL | Status: DC
  Administered 2012-11-24: 75 mg via ORAL
  Filled 2012-11-23 (×2): qty 1

## 2012-11-23 MED ORDER — AMIODARONE HCL 100 MG PO TABS
100.0000 mg | ORAL_TABLET | Freq: Every day | ORAL | Status: DC
  Administered 2012-11-23 – 2012-11-26 (×4): 100 mg via ORAL
  Filled 2012-11-23 (×5): qty 1

## 2012-11-23 MED ORDER — VITAMIN D (ERGOCALCIFEROL) 1.25 MG (50000 UNIT) PO CAPS
50000.0000 [IU] | ORAL_CAPSULE | ORAL | Status: DC
  Administered 2012-11-26: 50000 [IU] via ORAL
  Filled 2012-11-23: qty 1

## 2012-11-23 MED ORDER — ATORVASTATIN CALCIUM 10 MG PO TABS
10.0000 mg | ORAL_TABLET | Freq: Every day | ORAL | Status: DC
  Administered 2012-11-23 – 2012-11-26 (×4): 10 mg via ORAL
  Filled 2012-11-23 (×5): qty 1

## 2012-11-23 MED ORDER — DOCUSATE SODIUM 100 MG PO CAPS
200.0000 mg | ORAL_CAPSULE | Freq: Two times a day (BID) | ORAL | Status: DC
  Administered 2012-11-23 – 2012-11-26 (×6): 200 mg via ORAL
  Filled 2012-11-23 (×9): qty 2

## 2012-11-23 MED ORDER — FUROSEMIDE 20 MG PO TABS
60.0000 mg | ORAL_TABLET | Freq: Two times a day (BID) | ORAL | Status: DC
  Administered 2012-11-24 – 2012-11-27 (×7): 60 mg via ORAL
  Filled 2012-11-23 (×10): qty 1

## 2012-11-23 MED ORDER — MORPHINE SULFATE 2 MG/ML IJ SOLN
INTRAMUSCULAR | Status: AC
  Administered 2012-11-23: 2 mg via INTRAVENOUS
  Filled 2012-11-23: qty 1

## 2012-11-23 MED ORDER — SENNOSIDES-DOCUSATE SODIUM 8.6-50 MG PO TABS
1.0000 | ORAL_TABLET | Freq: Two times a day (BID) | ORAL | Status: DC
  Administered 2012-11-23 – 2012-11-26 (×5): 1 via ORAL
  Filled 2012-11-23 (×7): qty 1

## 2012-11-23 MED ORDER — ONDANSETRON HCL 4 MG/2ML IJ SOLN: 4.0000 mg | Freq: Four times a day (QID) | INTRAMUSCULAR | Status: DC | PRN

## 2012-11-23 MED ORDER — MORPHINE SULFATE 4 MG/ML IJ SOLN: 4.0000 mg | Freq: Once | INTRAMUSCULAR | Status: DC

## 2012-11-23 MED ORDER — MORPHINE SULFATE 2 MG/ML IJ SOLN
2.0000 mg | INTRAMUSCULAR | Status: DC | PRN
  Administered 2012-11-24: 2 mg via INTRAVENOUS
  Filled 2012-11-23: qty 1

## 2012-11-23 MED ORDER — TAMSULOSIN HCL 0.4 MG PO CAPS
0.4000 mg | ORAL_CAPSULE | Freq: Every day | ORAL | Status: DC
  Administered 2012-11-24 – 2012-11-27 (×4): 0.4 mg via ORAL
  Filled 2012-11-23 (×4): qty 1

## 2012-11-23 MED ORDER — ENOXAPARIN SODIUM 60 MG/0.6ML ~~LOC~~ SOLN: 50.0000 mg | Freq: Once | SUBCUTANEOUS | Status: DC

## 2012-11-23 MED ORDER — ALPRAZOLAM 0.25 MG PO TABS
0.2500 mg | ORAL_TABLET | Freq: Every day | ORAL | Status: DC
  Administered 2012-11-23 – 2012-11-26 (×4): 0.25 mg via ORAL
  Filled 2012-11-23 (×5): qty 1

## 2012-11-23 MED ORDER — ISOSORBIDE MONONITRATE ER 60 MG PO TB24
60.0000 mg | ORAL_TABLET | Freq: Every day | ORAL | Status: DC
  Administered 2012-11-24 – 2012-11-27 (×4): 60 mg via ORAL
  Filled 2012-11-23 (×4): qty 1

## 2012-11-23 MED ORDER — ALUM & MAG HYDROXIDE-SIMETH 200-200-20 MG/5ML PO SUSP
30.0000 mL | ORAL | Status: DC | PRN
  Administered 2012-11-23: 30 mL via ORAL
  Filled 2012-11-23: qty 30

## 2012-11-23 MED ORDER — ACETAMINOPHEN 325 MG PO TABS: 650.0000 mg | ORAL_TABLET | ORAL | Status: DC | PRN

## 2012-11-23 MED ORDER — HYDROCODONE-ACETAMINOPHEN 7.5-325 MG/15ML PO SOLN
10.0000 mL | Freq: Four times a day (QID) | ORAL | Status: DC | PRN
  Administered 2012-11-23 – 2012-11-25 (×5): 10 mL via ORAL
  Administered 2012-11-25: 10:00:00 via ORAL
  Administered 2012-11-25 – 2012-11-27 (×5): 10 mL via ORAL
  Filled 2012-11-23 (×11): qty 15

## 2012-11-23 MED ORDER — NITROGLYCERIN 0.4 MG SL SUBL: 0.4000 mg | SUBLINGUAL_TABLET | SUBLINGUAL | Status: DC | PRN

## 2012-11-23 MED ORDER — MORPHINE SULFATE 2 MG/ML IJ SOLN
2.0000 mg | Freq: Once | INTRAMUSCULAR | Status: AC
  Administered 2012-11-23: 2 mg via INTRAVENOUS
  Filled 2012-11-23: qty 1

## 2012-11-23 MED ORDER — ASPIRIN EC 81 MG PO TBEC
81.0000 mg | DELAYED_RELEASE_TABLET | Freq: Every day | ORAL | Status: DC
  Administered 2012-11-24 – 2012-11-27 (×4): 81 mg via ORAL
  Filled 2012-11-23 (×4): qty 1

## 2012-11-23 MED ORDER — CARVEDILOL 12.5 MG PO TABS
12.5000 mg | ORAL_TABLET | Freq: Two times a day (BID) | ORAL | Status: DC
  Administered 2012-11-24 – 2012-11-27 (×7): 12.5 mg via ORAL
  Filled 2012-11-23 (×9): qty 1

## 2012-11-23 NOTE — ED Provider Notes (Signed)
History  This chart was scribed for [name] Octave, MD by [name], ED Scribe. This patient was seen in room APA14/APA14 and the patient's care was started at 3:14 PM.  CSN: [id number]  Arrival date & time 11/23/12  1500   First MD Initiated Contact with Patient 11/23/12 1514      Chief Complaint  Patient presents with  . Chest Pain     The history is provided by the patient. No language interpreter was used.    [name] [name] is a 79 y.o. female brought in by ambulance from the [facility] with a h/o CAD, stent placement and prior MI, who presents to the Emergency Department complaining of sudden onset, gradually improving, constant substernal CP described as sharp pressure that started 2 hours ago while she was laying down resting with associated SOB and cough. She states that the SOB is worse than usual but reports that her CP is "easing off" currently. She states that the 3 NTG SL and 324 mg ASA given PTA improved her symptoms. She reports similar pain with her 3 prior MIs, last MI was 2 years ago. She reports prior stress tests and cardiac catheterizations, last one in 2011. She denies fever, neck pain, sore throat, visual disturbance, abdominal pain, nausea, emesis, diarrhea, urinary symptoms, back pain, HA, weakness, numbness and rash as associated symptoms. She also has a h/o HLD, HTN, COPD and anemia. She denies smoking and alcohol use.  Sees Dr. Antoine Poche is her Cardiologist States that she does not see a PCP Pt states that she lives at home with daughter, contrary to nursing note that states she is from the [facility]  Past Medical History  Diagnosis Date  . Coronary artery disease     a. LAD stent 1999 after NSTEMI. b. CABG 2004. c. Last LHC 02/2010 - patent grafts.  . Hypertension   . Hyperlipidemia   . Carotid artery disease     status post right carotid endarterectomy.  . Degenerative joint disease   . Restless leg syndrome   . Chronic renal insufficiency   .  Heparin induced thrombocytopenia   . Diphtheria     "as a child"  . Pleural effusion, bilateral 06/2003  . Pseudoaneurysm     status post repair following catherization in 1999  . Chronic systolic heart failure 10/02/11  . Ischemic cardiomyopathy     Echocardiogram 10/01/11: Mild LVH, EF 35-40%, basal inferior, basal to mid posterior and basal to mid anterolateral severe hypokinesis, mild to moderate AI, moderate MR (ischemic MR), moderate LAE, mild RVE, mildly reduced RV systolic function, mild RAE, PASP 43-44  . COPD (chronic obstructive pulmonary disease)   . Atrial fibrillation/flutter     s/p multiple DCCVs;  amiodarone started 09/2011, TEE DCCV 3/13;  amio and coumadin d/c'd after admxn to [facility] 02/2012 with frequent falls/syncope  . Tachycardia-bradycardia syndrome     s/p [employer] pacemaker implant 09/2011 (8 sec pause noted)  . GERD (gastroesophageal reflux disease)   . Renal artery stenosis     bilateral- status post stenting  . Non-functioning kidney   . Depression     "cries alot"  . Gout   . Hypothyroidism   . Anemia     Past Surgical History  Procedure Laterality Date  . Carotid endarterectomy  05/2003    right  . Pacemaker insertion  10/02/11    implanted by Dr Johney Frame  . Cholecystectomy  ?02/2011  . Tonsillectomy and adenoidectomy    . Renal artery  stent  06/2003    bilaterally/e-chart  . Thoracentesis  ? 2000; 05/2011  . Coronary angioplasty with stent placement  1999  . Av fistula repair  01/1998    w/pseudoaneurysm repair S/P catheterization/E-chart  . Coronary artery bypass graft  05/2003    CABG X 3  . Tee without cardioversion  10/23/2011    Procedure: TRANSESOPHAGEAL ECHOCARDIOGRAM (TEE);  Surgeon: Wendall Stade, MD;  Location: [facility] ENDOSCOPY;  Service: Cardiovascular;  Laterality: N/A;  . Cardioversion  10/23/2011    Procedure: CARDIOVERSION;  Surgeon: Wendall Stade, MD;  Location: [facility] ENDOSCOPY;  Service: Cardiovascular;  Laterality: N/A;    Family History   Problem Relation Age of Onset  . Cancer Mother 96    died  . Lung disease Father 17    died  . Heart disease Sister     2 sisters and her son  . Anesthesia problems Neg Hx     History  Substance Use Topics  . Smoking status: Never Smoker   . Smokeless tobacco: Never Used  . Alcohol Use: No    No OB history provided.  Review of Systems  A complete 10 system review of systems was obtained and all systems are negative except as noted in the HPI and PMH.   Allergies  Altace; Contrast media; Heparin; and Hydromorphone hcl  Home Medications   Current Outpatient Rx  Name  Route  Sig  Dispense  Refill  . acetaminophen (TYLENOL) 325 MG tablet   Oral   Take 2 tablets (650 mg total) by mouth every 4 (four) hours as needed for pain or fever.         . ALPRAZolam (XANAX) 0.5 MG tablet      Take one tablet by mouth at bedtime and take one tablet up to twice daily as needed for anxiety.   90 tablet   5   . alum & mag hydroxide-simeth (MAALOX/MYLANTA) 200-200-20 MG/5ML suspension   Oral   Take 30 mLs by mouth every 4 (four) hours as needed for indigestion.   355 mL      . amiodarone (PACERONE) 100 MG tablet   Oral   Take 100 mg by mouth at bedtime.          Marland Kitchen aspirin EC 81 MG tablet   Oral   Take 1 tablet (81 mg total) by mouth daily.         Marland Kitchen atorvastatin (LIPITOR) 10 MG tablet   Oral   Take 1 tablet (10 mg total) by mouth daily at 6 PM.   30 tablet   11   . carvedilol (COREG) 12.5 MG tablet   Oral   Take 1 tablet (12.5 mg total) by mouth 2 (two) times daily with a meal.   120 tablet   3   . Cyanocobalamin (VITAMIN B-12 IJ)   Injection   Inject 1,000 mcg as directed every 14 (fourteen) days. Administered at MD office         . docusate sodium (COLACE) 100 MG capsule   Oral   Take 2 capsules (200 mg total) by mouth 2 (two) times daily.   120 capsule   0     Follow up with your primary care doctor for this m ...   . furosemide (LASIX) 40 MG  tablet   Oral   Take 1 tablet (40 mg total) by mouth 2 (two) times daily.   60 tablet   11   . HYDROcodone-acetaminophen (NORCO) 10-325 MG  per tablet   Oral   Take 1 tablet by mouth every 6 (six) hours.   120 tablet   1   . isosorbide mononitrate (IMDUR) 30 MG 24 hr tablet   Oral   Take 1 tablet (30 mg total) by mouth daily.   30 tablet   3   . lactulose (CHRONULAC) 10 GM/15ML solution   Oral   Take 30 mLs (20 g total) by mouth 2 (two) times daily as needed.   240 mL   1   . levothyroxine (SYNTHROID, LEVOTHROID) 50 MCG tablet   Oral   Take 50 mcg by mouth at bedtime.          . lidocaine (LIDODERM) 5 %   Transdermal   Place 1 patch onto the skin daily. Remove & Discard patch within 12 hours or as directed by MD   30 patch   6   . nitroGLYCERIN (NITROSTAT) 0.4 MG SL tablet   Sublingual   Place 0.4 mg under the tongue every 5 (five) minutes as needed. For chest pain         . pantoprazole (PROTONIX) 40 MG tablet   Oral   Take 1 tablet (40 mg total) by mouth daily.   30 tablet   11   . senna-docusate (SENOKOT-S) 8.6-50 MG per tablet   Oral   Take 1 tablet by mouth 2 (two) times daily.         . Tamsulosin HCl (FLOMAX) 0.4 MG CAPS   Oral   Take 1 capsule (0.4 mg total) by mouth daily.   30 capsule   0     Follow up with your primary care doctor/urologist  ...   . Vitamin D, Ergocalciferol, (DRISDOL) 50000 UNITS CAPS   Oral   Take 50,000 Units by mouth every 7 (seven) days. Takes on Wed           Triage Vitals: BP 134/77  Pulse 76  Temp(Src) 98.1 F (36.7 C) (Oral)  Resp 18  Ht 5\' 4"  (1.626 m)  Wt 112 lb (50.803 kg)  BMI 19.22 kg/m2  SpO2 92%  Physical Exam  Nursing note and vitals reviewed. Constitutional: She is oriented to person, place, and time. She appears well-developed and well-nourished. No distress.  HENT:  Head: Normocephalic and atraumatic.  Mouth/Throat: Oropharynx is clear and moist.  Eyes: Conjunctivae and EOM are normal.  Pupils are equal, round, and reactive to light.  Neck: Neck supple. No tracheal deviation present.  Cardiovascular: Normal rate and regular rhythm.   No murmur heard. Pulmonary/Chest: Effort normal and breath sounds normal. No respiratory distress. She exhibits no tenderness.  Left-sided pacemaker  Abdominal: Soft. There is no tenderness.  Musculoskeletal: Normal range of motion. She exhibits no edema (no peripheral edema).  Neurological: She is alert and oriented to person, place, and time. No cranial nerve deficit.  Left upper extremity weakness secondary to prior shoulder injury per pt  Skin: Skin is warm and dry.  Psychiatric: She has a normal mood and affect. Her behavior is normal.    ED Course  Procedures (including critical care time)  DIAGNOSTIC STUDIES: Oxygen Saturation is 95% on room aor, adequate by my interpretation.    COORDINATION OF CARE: 3:27 PM-Informed pt of her first troponin being negative. Discussed treatment plan which includes consult to Cardiology, CXR, CBC panel, BMP, and troponin with pt at bedside and pt agreed to plan.   4:01 PM-Consult complete with Dr. Dietrich Pates, Cardiology. Patient case explained and  discussed. Dr.Rothbart recommends to start an IV NTG and anticoagulant drip. He agrees to admit patient for further evaluation and treatment to [facility] cardiac step down unit. Call ended at 4:02 PM  4:06 PM- Pt rechecked and still reports pain. Daughters now present in room. Per daughters, pt has an allergy to heparin, "it clots her blood instead of thins it". Informed pt and family of plan to transfer for admission and all were agreeable.   Labs Reviewed  CBC WITH DIFFERENTIAL - Abnormal; Notable for the following:    Hemoglobin 11.3 (*)    HCT 34.4 (*)    RDW 17.8 (*)    Platelets 140 (*)    All other components within normal limits  BASIC METABOLIC PANEL - Abnormal; Notable for the following:    Sodium 130 (*)    Chloride 92 (*)    Glucose, Bld 113 (*)     BUN 31 (*)    Creatinine, Ser 1.61 (*)    GFR calc non Af Amer 28 (*)    GFR calc Af Amer 33 (*)    All other components within normal limits  PRO B NATRIURETIC PEPTIDE - Abnormal; Notable for the following:    Pro B Natriuretic peptide (BNP) 6161.0 (*)    All other components within normal limits  TROPONIN I   Dg Chest Portable 1 View  11/23/2012  *RADIOLOGY REPORT*  Clinical Data: Chest pain.  PORTABLE CHEST - 1 VIEW  Comparison: 08/30/2012  Findings: Portable view of the chest demonstrates a left cardiac pacemaker.  Heart size is mildly enlarged but unchanged.  There is no focal airspace disease or edema.  Old post-traumatic changes in the proximal left femur.  IMPRESSION: No acute cardiopulmonary disease.  Cardiomegaly.   Original Report Authenticated By: Richarda Overlie, M.D.      No diagnosis found.    MDM  2 hours of sharp stabbing central chest pain with dizziness and shortness of breath improved with nitroglycerin. History of ischemic cardiomyopathy with pacer, CABG. Last catheterization 2011 showed patent grafts. Patient states this pain is similar.  EKG paced and unchanged. Troponin negative. Chest x-ray with no pulmonary edema.   Discussed with Dr. Dietrich Pates. He agrees transfer to [facility] cone appropriate for unstable angina. Patient started on IV nitroglycerin. Patient's family reports allergy to heparin in that it causes clotting and has caused her severe problems in the past.  They are not sure whether she has had lovenox. Will withhold at this point.   Date: 11/23/2012  Rate: 74  Rhythm: paced  QRS Axis: normal  Intervals: normal  ST/T Wave abnormalities: normal  Conduction Disutrbances:none  Narrative Interpretation:   Old EKG Reviewed: unchanged   I personally performed the services described in this documentation, which was scribed in my presence. The recorded information has been reviewed and is accurate.      [name] Octave, MD 11/23/12 [phone]

## 2012-11-23 NOTE — ED Notes (Signed)
[facility] here for transport to [facility] at this time

## 2012-11-23 NOTE — H&P (Signed)
[name] [name] is an 79 y.o. female.    Chief Complaint: Chest pain  HPI: 79 y/o female with a PMH of CAD presenting as a transfer from [facility] ER where she originally presented for evaluation of chest pain.  She has prior history of LAD stent in 1999 and CABG in 2004 (grafts unknown). Last cardiac cath from 02/2010 reported showed patent grafts.  She has been followed by Dr. Gala Romney for ischemic cardiomyopathy, NYHA class 4, LVEF 35-40% by TTE from 07/30/2012--last visit 09/05/2012, and she has in-hospital and out of hospital DNR/DNI.  She reports that she has been on continuous oxygen therapy until she went to [facility].  About 6 weeks ago, her oxygen was discontinued by medical staff at the nursing home.  Since then, she has been feeling poorly and has been waking up with difficulties breathing at night.  On the day of presentation, she complained of 9/10 sharp pain that started at rest, associated with shortness of breath and nausea (no vomiting). She denies any palpitation or syncope.  In the outside ER, she received SL NTG and ASA 324 mg and was transferred to [facility] for further evaluation and treatment.  Her initial cardiac markers are unremarkable and her EKG from 11/23/12 at 20:32 showed dual chamber electronic pacemaker. Her current weight is 50.3 Kg (was 50 kg 09/05/2012). Currently, she is complaining of 2/10 chest pain and she looks very comfortable and is without distress. She is allergic to Heparin and other medications.  Past Medical History  Diagnosis Date  . Coronary artery disease     a. LAD stent 1999 after NSTEMI. b. CABG 2004. c. Last LHC 02/2010 - patent grafts.  . Hypertension   . Hyperlipidemia   . Carotid artery disease     status post right carotid endarterectomy.  . Degenerative joint disease   . Restless leg syndrome   . Chronic renal insufficiency   . Heparin induced thrombocytopenia   . Diphtheria     "as a child"  . Pleural effusion, bilateral  06/2003  . Pseudoaneurysm     status post repair following catherization in 1999  . Chronic systolic heart failure 10/02/11  . Ischemic cardiomyopathy     Echocardiogram 10/01/11: Mild LVH, EF 35-40%, basal inferior, basal to mid posterior and basal to mid anterolateral severe hypokinesis, mild to moderate AI, moderate MR (ischemic MR), moderate LAE, mild RVE, mildly reduced RV systolic function, mild RAE, PASP 43-44  . COPD (chronic obstructive pulmonary disease)   . Atrial fibrillation/flutter     s/p multiple DCCVs;  amiodarone started 09/2011, TEE DCCV 3/13;  amio and coumadin d/c'd after admxn to [facility] 02/2012 with frequent falls/syncope  . Tachycardia-bradycardia syndrome     s/p Medtronic pacemaker implant 09/2011 (8 sec pause noted)  . GERD (gastroesophageal reflux disease)   . Renal artery stenosis     bilateral- status post stenting  . Non-functioning kidney   . Depression     "cries alot"  . Gout   . Hypothyroidism   . Anemia     Past Surgical History  Procedure Laterality Date  . Carotid endarterectomy  05/2003    right  . Pacemaker insertion  10/02/11    implanted by Dr Johney Frame  . Cholecystectomy  ?02/2011  . Tonsillectomy and adenoidectomy    . Renal artery stent  06/2003    bilaterally/e-chart  . Thoracentesis  ? 2000; 05/2011  . Coronary angioplasty with stent placement  1999  .  Av fistula repair  01/1998    w/pseudoaneurysm repair S/P catheterization/E-chart  . Coronary artery bypass graft  05/2003    CABG X 3  . Tee without cardioversion  10/23/2011    Procedure: TRANSESOPHAGEAL ECHOCARDIOGRAM (TEE);  Surgeon: Wendall Stade, MD;  Location: [facility] ENDOSCOPY;  Service: Cardiovascular;  Laterality: N/A;  . Cardioversion  10/23/2011    Procedure: CARDIOVERSION;  Surgeon: Wendall Stade, MD;  Location: [facility] ENDOSCOPY;  Service: Cardiovascular;  Laterality: N/A;    Family History  Problem Relation Age of Onset  . Cancer Mother 72    died  . Lung disease Father 79    died   . Heart disease Sister     2 sisters and her son  . Anesthesia problems Neg Hx    Social History:  reports that she has never smoked. She has never used smokeless tobacco. She reports that she does not drink alcohol or use illicit drugs.  Allergies:  Allergies  Allergen Reactions  . Altace (Ramipril) Hives  . Contrast Media (Iodinated Diagnostic Agents) Other (See Comments)    Reaction noted by md-unknown reaction per family  . Heparin Other (See Comments)    Clots blood instead of thinning  . Hydromorphone Hcl Other (See Comments)    Crazy feeling    Medications Prior to Admission  Medication Sig Dispense Refill  . ALPRAZolam (XANAX) 0.5 MG tablet Take 0.25 mg by mouth 2 (two) times daily as needed for anxiety. Do not administer Alprazolam and Hydrocodone together!!!      . ALPRAZolam (XANAX) 0.5 MG tablet Take 0.25 mg by mouth at bedtime.      Marland Kitchen alum & mag hydroxide-simeth (MAALOX/MYLANTA) 200-200-20 MG/5ML suspension Take 30 mLs by mouth every 4 (four) hours as needed for indigestion.  355 mL    . amiodarone (PACERONE) 200 MG tablet Take 100 mg by mouth at bedtime.      Marland Kitchen aspirin EC 81 MG tablet Take 1 tablet (81 mg total) by mouth daily.      Marland Kitchen atorvastatin (LIPITOR) 10 MG tablet Take 1 tablet (10 mg total) by mouth daily at 6 PM.  30 tablet  11  . carvedilol (COREG) 12.5 MG tablet Take 1 tablet (12.5 mg total) by mouth 2 (two) times daily with a meal.  120 tablet  3  . docusate sodium (COLACE) 100 MG capsule Take 2 capsules (200 mg total) by mouth 2 (two) times daily.  120 capsule  0  . furosemide (LASIX) 20 MG tablet Take 60 mg by mouth 2 (two) times daily.      Marland Kitchen HYDROcodone-acetaminophen (NORCO) 10-325 MG per tablet Take 1 tablet by mouth every 6 (six) hours as needed for pain. Do Not Administer Alprazolam and Hydrocodone together!!!      . isosorbide mononitrate (IMDUR) 30 MG 24 hr tablet Take 1 tablet (30 mg total) by mouth daily.  30 tablet  3  . lactulose (CHRONULAC) 10  GM/15ML solution Take 30 g by mouth 2 (two) times daily as needed. constipation      . levothyroxine (SYNTHROID, LEVOTHROID) 75 MCG tablet Take 75 mcg by mouth daily.      Marland Kitchen omeprazole (PRILOSEC) 20 MG capsule Take 20 mg by mouth daily.      Marland Kitchen senna-docusate (SENOKOT-S) 8.6-50 MG per tablet Take 1 tablet by mouth 2 (two) times daily.      . Tamsulosin HCl (FLOMAX) 0.4 MG CAPS Take 1 capsule (0.4 mg total) by mouth daily.  30 capsule  0  . acetaminophen (TYLENOL) 325 MG tablet Take 2 tablets (650 mg total) by mouth every 4 (four) hours as needed for pain or fever.      . Cyanocobalamin (VITAMIN B-12 IJ) Inject 1,000 mcg as directed every 14 (fourteen) days. Administered at MD office      . nitroGLYCERIN (NITROSTAT) 0.4 MG SL tablet Place 0.4 mg under the tongue every 5 (five) minutes as needed. For chest pain      . Vitamin D, Ergocalciferol, (DRISDOL) 50000 UNITS CAPS Take 50,000 Units by mouth every 7 (seven) days. Takes on Wed        Results for orders placed during the hospital encounter of 11/23/12 (from the past 48 hour(s))  CBC WITH DIFFERENTIAL     Status: Abnormal   Collection Time    11/23/12  3:16 PM      Result Value Range   WBC 4.1  4.0 - 10.5 K/uL   RBC 4.08  3.87 - 5.11 MIL/uL   Hemoglobin 11.3 (*) 12.0 - 15.0 g/dL   HCT 16.1 (*) 09.6 - 04.5 %   MCV 84.3  78.0 - 100.0 fL   MCH 27.7  26.0 - 34.0 pg   MCHC 32.8  30.0 - 36.0 g/dL   RDW 40.9 (*) 81.1 - 91.4 %   Platelets 140 (*) 150 - 400 K/uL   Neutrophils Relative 64  43 - 77 %   Neutro Abs 2.6  1.7 - 7.7 K/uL   Lymphocytes Relative 27  12 - 46 %   Lymphs Abs 1.1  0.7 - 4.0 K/uL   Monocytes Relative 7  3 - 12 %   Monocytes Absolute 0.3  0.1 - 1.0 K/uL   Eosinophils Relative 2  0 - 5 %   Eosinophils Absolute 0.1  0.0 - 0.7 K/uL   Basophils Relative 1  0 - 1 %   Basophils Absolute 0.0  0.0 - 0.1 K/uL  BASIC METABOLIC PANEL     Status: Abnormal   Collection Time    11/23/12  3:16 PM      Result Value Range   Sodium 130  (*) 135 - 145 mEq/L   Potassium 4.0  3.5 - 5.1 mEq/L   Chloride 92 (*) 96 - 112 mEq/L   CO2 26  19 - 32 mEq/L   Glucose, Bld 113 (*) 70 - 99 mg/dL   BUN 31 (*) 6 - 23 mg/dL   Creatinine, Ser 7.82 (*) 0.50 - 1.10 mg/dL   Calcium 8.9  8.4 - 95.6 mg/dL   GFR calc non Af Amer 28 (*) >90 mL/min   GFR calc Af Amer 33 (*) >90 mL/min   Comment:            The eGFR has been calculated     using the CKD EPI equation.     This calculation has not been     validated in all clinical     situations.     eGFR's persistently     <90 mL/min signify     possible Chronic Kidney Disease.  TROPONIN I     Status: None   Collection Time    11/23/12  3:16 PM      Result Value Range   Troponin I <0.30  <0.30 ng/mL   Comment:            Due to the release kinetics of cTnI,     a negative result within the first hours  of the onset of symptoms does not rule out     myocardial infarction with certainty.     If myocardial infarction is still suspected,     repeat the test at appropriate intervals.  PRO B NATRIURETIC PEPTIDE     Status: Abnormal   Collection Time    11/23/12  3:16 PM      Result Value Range   Pro B Natriuretic peptide (BNP) 6161.0 (*) 0 - 450 pg/mL  TROPONIN I     Status: None   Collection Time    11/23/12  6:47 PM      Result Value Range   Troponin I <0.30  <0.30 ng/mL   Comment:            Due to the release kinetics of cTnI,     a negative result within the first hours     of the onset of symptoms does not rule out     myocardial infarction with certainty.     If myocardial infarction is still suspected,     repeat the test at appropriate intervals.   Dg Chest Portable 1 View  11/23/2012  *RADIOLOGY REPORT*  Clinical Data: Chest pain.  PORTABLE CHEST - 1 VIEW  Comparison: 08/30/2012  Findings: Portable view of the chest demonstrates a left cardiac pacemaker.  Heart size is mildly enlarged but unchanged.  There is no focal airspace disease or edema.  Old post-traumatic  changes in the proximal left femur.  IMPRESSION: No acute cardiopulmonary disease.  Cardiomegaly.   Original Report Authenticated By: Richarda Overlie, M.D.     Review of Systems  Constitutional: Negative for fever, chills, weight loss, malaise/fatigue and diaphoresis.  HENT: Negative for hearing loss, ear pain, nosebleeds, congestion, sore throat, neck pain, tinnitus and ear discharge.   Eyes: Negative for blurred vision, double vision, photophobia, pain, discharge and redness.  Respiratory: Positive for cough and shortness of breath. Negative for hemoptysis, sputum production, wheezing and stridor.   Cardiovascular: Positive for chest pain. Negative for palpitations, orthopnea, claudication, leg swelling and PND.  Gastrointestinal: Positive for nausea. Negative for heartburn, vomiting, abdominal pain, diarrhea, constipation, blood in stool and melena.  Genitourinary: Negative for dysuria, urgency, frequency and hematuria.  Musculoskeletal: Negative for myalgias and back pain.  Skin: Negative for itching and rash.  Neurological: Negative for dizziness, tingling, sensory change, seizures, weakness and headaches.  Psychiatric/Behavioral: Negative for hallucinations.    Blood pressure 146/63, pulse 71, temperature 98.1 F (36.7 C), temperature source Oral, resp. rate 18, height 5\' 4"  (1.626 m), weight 50.3 kg (110 lb 14.3 oz), SpO2 94.00%. Physical Exam  Constitutional: She is oriented to person, place, and time. She appears well-developed and well-nourished. No distress.  HENT:  Head: Normocephalic.  Eyes: EOM are normal. Right eye exhibits no discharge. Left eye exhibits no discharge. No scleral icterus.  Neck: No JVD present. No tracheal deviation present. No thyromegaly present.  Cardiovascular: Normal rate and regular rhythm.  Exam reveals no gallop and no friction rub.   Murmur heard. Respiratory: Effort normal and breath sounds normal. No stridor. No respiratory distress. She has no wheezes.  She has no rales. She exhibits no tenderness.  GI: She exhibits no distension. There is no tenderness. There is no rebound and no guarding.  Musculoskeletal: She exhibits no edema and no tenderness.  Neurological: She is alert and oriented to person, place, and time.  Skin: No rash noted. She is not diaphoretic. No erythema.  Psychiatric: She has a normal  mood and affect.     Assessment/Plan  1.  Chest pain: She has a history of CAD as described above. Her cardiac markers are negative thus far and her EKG is currently showing all paced rhythm.  She reports that she had some complication during her last cardiac cath (no record) and she was reportedly told to avoid cardiac cath in the future.  Given her co-morbidities, conservative management is appropriate. I will restart her ASA, beta-blockers and Lipitor.  I will increase her Imdur from 30 mg to 60 mg qd and start her on Plavix 75mg  qd.  Will treat her chest pain with prn nitrates and morphine  2. Chronic ischemic cardiomyopathy, NYHA class 4, LVEF 35-40% by TTE from 10/01/2011.  She has no weight gains since 09/05/2012.  I will resume her heart failure medications.  3.  Hyperlipidemia: patient is on Lipitor which will be continued. Will check fasting lipids in a.m.  4. CKD stage 4. Serum creatinine 1.6 today (was 1.47 08/2012.  Bishoy Cupp E 11/23/2012, 9:49 PM

## 2012-11-23 NOTE — ED Notes (Signed)
Pt c/o sudden central sharp pains to chest with intermittant dizziness/sob x 2 hours ago. Pt from [facility]. Has had total 3 ntg sl and 324 asa pta. Pt c/o nausea. Bm;s normal and denies black or bloody stools. Denies gu sx's. Pt is alert/oriented. PT is DNR.

## 2012-11-23 NOTE — ED Notes (Signed)
02 2l [place] applied. Nad.

## 2012-11-24 DIAGNOSIS — I200 Unstable angina: Secondary | ICD-10-CM

## 2012-11-24 LAB — CBC
Hemoglobin: 10.9 g/dL — ABNORMAL LOW (ref 12.0–15.0)
MCH: 27.2 pg (ref 26.0–34.0)
MCHC: 32.6 g/dL (ref 30.0–36.0)
MCV: 83.3 fL (ref 78.0–100.0)
RBC: 4.01 MIL/uL (ref 3.87–5.11)

## 2012-11-24 LAB — BASIC METABOLIC PANEL
BUN: 34 mg/dL — ABNORMAL HIGH (ref 6–23)
CO2: 28 mEq/L (ref 19–32)
Calcium: 8.8 mg/dL (ref 8.4–10.5)
Creatinine, Ser: 1.52 mg/dL — ABNORMAL HIGH (ref 0.50–1.10)
GFR calc non Af Amer: 30 mL/min — ABNORMAL LOW (ref >90)
Glucose, Bld: 125 mg/dL — ABNORMAL HIGH (ref 70–99)
Sodium: 132 mEq/L — ABNORMAL LOW (ref 135–145)

## 2012-11-24 LAB — LIPID PANEL
LDL Cholesterol: 73 mg/dL (ref 0–99)
Triglycerides: 100 mg/dL (ref <150)
VLDL: 20 mg/dL (ref 0–40)

## 2012-11-24 LAB — HEMOGLOBIN A1C: Hgb A1c MFr Bld: 6.5 % — ABNORMAL HIGH (ref <5.7)

## 2012-11-24 LAB — TROPONIN I: Troponin I: <0.3 ng/mL (ref <0.30)

## 2012-11-24 MED ORDER — DIPHENHYDRAMINE HCL 25 MG PO CAPS
25.0000 mg | ORAL_CAPSULE | Freq: Four times a day (QID) | ORAL | Status: DC | PRN
  Administered 2012-11-25 – 2012-11-26 (×6): 25 mg via ORAL
  Filled 2012-11-24 (×6): qty 1

## 2012-11-24 MED ORDER — DIPHENHYDRAMINE HCL 50 MG/ML IJ SOLN
25.0000 mg | Freq: Once | INTRAMUSCULAR | Status: AC
  Administered 2012-11-24: 25 mg via INTRAVENOUS
  Filled 2012-11-24: qty 1

## 2012-11-24 NOTE — Progress Notes (Signed)
Patient Name: [name] [name] Date of Encounter: 11/24/2012     Active Problems:   * No active hospital problems. *    SUBJECTIVE  Doing some better.  No current pain.  Has been in a rehab facility for a year.  Was upset no one would prescribe xanax and pain meds for her.    CURRENT MEDS . ALPRAZolam  0.25 mg Oral QHS  . amiodarone  100 mg Oral QHS  . aspirin EC  81 mg Oral Daily  . atorvastatin  10 mg Oral q1800  . carvedilol  12.5 mg Oral BID WC  . clopidogrel  75 mg Oral Q breakfast  . docusate sodium  200 mg Oral BID  . furosemide  60 mg Oral BID  . isosorbide mononitrate  60 mg Oral Daily  . levothyroxine  75 mcg Oral Daily  . pantoprazole  40 mg Oral Daily  . senna-docusate  1 tablet Oral BID  . tamsulosin  0.4 mg Oral Daily  . [START ON 11/26/2012] Vitamin D (Ergocalciferol)  50,000 Units Oral Q7 days    OBJECTIVE  Filed Vitals:   11/23/12 2202 11/23/12 2323 11/24/12 0357 11/24/12 0800  BP: 151/58  112/48 117/48  Pulse: 70  71 70  Temp: 98.6 F (37 C) 98.6 F (37 C) 98.3 F (36.8 C) 98.9 F (37.2 C)  TempSrc: Oral Oral Oral Oral  Resp: 17  17 14   Height:      Weight:      SpO2: 95%  97% 95%    Intake/Output Summary (Last 24 hours) at 11/24/12 0843 Last data filed at 11/23/12 2006  Gross per 24 hour  Intake      0 ml  Output    300 ml  Net   -300 ml   Filed Weights   11/23/12 1510 11/23/12 2006  Weight: 112 lb (50.803 kg) 110 lb 14.3 oz (50.3 kg)    PHYSICAL EXAM  General: Thin elderly female.  NAD.    Neuro: Alert and oriented X 3. Moves all extremities spontaneously. Psych: Normal affect. HEENT:  Normal  Neck: Supple without bruits or JVD. Lungs:  Resp regular and unlabored, CTA. Heart: RRR no s3, s4, or murmurs. Abdomen: Soft, non-tender, non-distended, BS + x 4.  Extremities: No clubbing, cyanosis or edema. DP/PT/Radials 2+ and equal bilaterally.  Accessory Clinical Findings  CBC  Recent Labs  11/23/12 1516  WBC 4.1    NEUTROABS 2.6  HGB 11.3*  HCT 34.4*  MCV 84.3  PLT 140*   Basic Metabolic Panel  Recent Labs  11/23/12 1516 11/23/12 2237  NA 130*  --   K 4.0  --   CL 92*  --   CO2 26  --   GLUCOSE 113*  --   BUN 31*  --   CREATININE 1.61*  --   CALCIUM 8.9  --   MG  --  2.3   Liver Function Tests No results found for this basename: AST, ALT, ALKPHOS, BILITOT, PROT, ALBUMIN,  in the last 72 hours No results found for this basename: LIPASE, AMYLASE,  in the last 72 hours Cardiac Enzymes  Recent Labs  11/23/12 1516 11/23/12 1847 11/23/12 2237  TROPONINI <0.30 <0.30 <0.30   BNP No components found with this basename: POCBNP,  D-Dimer No results found for this basename: DDIMER,  in the last 72 hours Hemoglobin A1C No results found for this basename: HGBA1C,  in the last 72 hours Fasting Lipid Panel No results found  for this basename: CHOL, HDL, LDLCALC, TRIG, CHOLHDL, LDLDIRECT,  in the last 72 hours Thyroid Function Tests No results found for this basename: TSH, T4TOTAL, FREET3, T3FREE, THYROIDAB,  in the last 72 hours  TELE  Av pacing.    ECG  Av pacing.    Radiology/Studies  Dg Chest Portable 1 View  11/23/2012  *RADIOLOGY REPORT*  Clinical Data: Chest pain.  PORTABLE CHEST - 1 VIEW  Comparison: 08/30/2012  Findings: Portable view of the chest demonstrates a left cardiac pacemaker.  Heart size is mildly enlarged but unchanged.  There is no focal airspace disease or edema.  Old post-traumatic changes in the proximal left femur.  IMPRESSION: No acute cardiopulmonary disease.  Cardiomegaly.   Original Report Authenticated By: Richarda Overlie, M.D.     ASSESSMENT AND PLAN  1.  Chest pain with neg troponins--etiology unclear  Plan up in chair, case management.  Would defer cath study for now.    Signed, Shawnie Pons MD, [facility], FSCAI

## 2012-11-24 NOTE — Care Management Note (Addendum)
    Page 1 of 1   11/27/2012     10:05:10 AM   CARE MANAGEMENT NOTE 11/27/2012  Patient:  [name] [name], [name] [name]   Account Number:  [id number]  Date Initiated:  11/24/2012  Documentation initiated by:  Junius Creamer  Subjective/Objective Assessment:   adm w ch pain     Action/Plan:   was at [facility] for rehab, has chilred, pcp dr Benedetto Goad   Anticipated DC Date:     Anticipated DC Plan:  SKILLED NURSING FACILITY  In-house referral  Clinical Social Worker      DC Planning Services  CM consult      Choice offered to / List presented to:             Status of service:  Completed, signed off Medicare Important Message given?   (If response is "NO", the following Medicare IM given date fields will be blank) Date Medicare IM given:   Date Additional Medicare IM given:    Discharge Disposition:  SKILLED NURSING FACILITY  Per UR Regulation:  Reviewed for med. necessity/level of care/duration of stay  If discussed at Long Length of Stay Meetings, dates discussed:    Comments:  11-27-12 1000 Tomi Bamberger, RN,BSN [phone] Plan to return to [facility] today. No further needs from CM at this time.  4/14 0932 debbie dowell rn,bsn

## 2012-11-24 NOTE — Progress Notes (Signed)
Patient abdomen and back red, warm to touch, and itching, no blisters or sores noted. Ward Givens, NP notified of event, new orders received with continue to monitor patient.

## 2012-11-25 DIAGNOSIS — I208 Other forms of angina pectoris: Secondary | ICD-10-CM | POA: Diagnosis present

## 2012-11-25 DIAGNOSIS — I059 Rheumatic mitral valve disease, unspecified: Secondary | ICD-10-CM

## 2012-11-25 DIAGNOSIS — R079 Chest pain, unspecified: Secondary | ICD-10-CM

## 2012-11-25 DIAGNOSIS — R21 Rash and other nonspecific skin eruption: Secondary | ICD-10-CM | POA: Diagnosis not present

## 2012-11-25 LAB — URINE MICROSCOPIC-ADD ON

## 2012-11-25 LAB — CBC
HCT: 34 % — ABNORMAL LOW (ref 36.0–46.0)
Hemoglobin: 11.4 g/dL — ABNORMAL LOW (ref 12.0–15.0)
MCHC: 33.5 g/dL (ref 30.0–36.0)
RBC: 4.12 MIL/uL (ref 3.87–5.11)
WBC: 4.1 10*3/uL (ref 4.0–10.5)

## 2012-11-25 LAB — URINALYSIS, ROUTINE W REFLEX MICROSCOPIC
Glucose, UA: NEGATIVE mg/dL
Hgb urine dipstick: NEGATIVE
Ketones, ur: NEGATIVE mg/dL
pH: 6.5 (ref 5.0–8.0)

## 2012-11-25 NOTE — Clinical Social Work Psychosocial (Signed)
     Clinical Social Work Department BRIEF PSYCHOSOCIAL ASSESSMENT 11/25/2012  Patient:  [name] [name], [name] [name]     Account Number:  [id number]     Admit date:  11/23/2012  Clinical Social Worker:  Hulan Fray  Date/Time:  11/25/2012 03:20 PM  Referred by:  Care Management  Date Referred:  11/24/2012 Referred for  SNF Placement   Other Referral:   Interview type:  Patient Other interview type:    PSYCHOSOCIAL DATA Living Status:  FACILITY Admitted from facility:  [facility] Level of care:  Skilled Nursing Facility Primary support name:  Lucienne Minks Primary support relationship to patient:  CHILD, ADULT Degree of support available:   supportive    CURRENT CONCERNS Current Concerns  Post-Acute Placement   Other Concerns:    SOCIAL WORK ASSESSMENT / PLAN Clinical Social Worker received referral for patient being admitted from SNF. CSW spoke with patient and patient confirmed that she was admitted from [facility] facility. Per patient, the plan is to return back at discharge. Patient reported that she has been at the facility for 2 1/2 months.    CSW left a message with Lorina Rabon at [facility] and requested a return call. CSW will complete FL2 for MD's signature and continue to follow.   Assessment/plan status:  Psychosocial Support/Ongoing Assessment of Needs Other assessment/ plan:   Information/referral to community resources:   Patient is from [facility] SNF    PATIENTS/FAMILYS RESPONSE TO PLAN OF CARE: Patient reported that the plan is to return back to [facility] at discharge. Patient was appreciative of CSW's assistance.

## 2012-11-25 NOTE — Progress Notes (Signed)
Patient Name: MAYELIN PANOS Date of Encounter: 11/25/2012     Active Problems:   * No active hospital problems. *    SUBJECTIVE  Patient has been "miserable" all night.  Developed a rash yesterday afternoon which has spread to the trunk and back last night and itches.  Currently no chest pain or other cardiac symptoms.  Received first dose of plavix yesterday on admission as she was admitted as USAP and not eligible for UFH.    CURRENT MEDS . ALPRAZolam  0.25 mg Oral QHS  . amiodarone  100 mg Oral QHS  . aspirin EC  81 mg Oral Daily  . atorvastatin  10 mg Oral q1800  . carvedilol  12.5 mg Oral BID WC  . clopidogrel  75 mg Oral Q breakfast  . docusate sodium  200 mg Oral BID  . furosemide  60 mg Oral BID  . isosorbide mononitrate  60 mg Oral Daily  . levothyroxine  75 mcg Oral Daily  . pantoprazole  40 mg Oral Daily  . senna-docusate  1 tablet Oral BID  . tamsulosin  0.4 mg Oral Daily  . [START ON 11/26/2012] Vitamin D (Ergocalciferol)  50,000 Units Oral Q7 days    OBJECTIVE  Filed Vitals:   11/25/12 0004 11/25/12 0350 11/25/12 0409 11/25/12 0740  BP:  138/49  127/96  Pulse:  71    Temp: 98.2 F (36.8 C)  97.3 F (36.3 C) 98.3 F (36.8 C)  TempSrc: Axillary  Oral Oral  Resp:  22  15  Height:      Weight:      SpO2:  93%  100%    Intake/Output Summary (Last 24 hours) at 11/25/12 0812 Last data filed at 11/25/12 0000  Gross per 24 hour  Intake    660 ml  Output   1100 ml  Net   -440 ml   Filed Weights   11/23/12 1510 11/23/12 2006  Weight: 112 lb (50.803 kg) 110 lb 14.3 oz (50.3 kg)    PHYSICAL EXAM  General: Pleasant, NAD. Neuro: Alert and oriented X 3. Moves all extremities spontaneously. Psych: Normal affect. HEENT:  Normal  Neck: Supple without bruits or JVD. Lungs:  Resp regular and unlabored, CTA. Heart: RRR no s3, s4, or murmurs. Extremities: No clubbing, cyanosis or edema. DP/PT/Radials 2+ and equal bilaterally. Skin:  Diffuse fine rash  over back and trunk with some involvement of the arms.    Accessory Clinical Findings  CBC  Recent Labs  11/23/12 1516 11/24/12 0955 11/25/12 0510  WBC 4.1 4.6 4.1  NEUTROABS 2.6  --   --   HGB 11.3* 10.9* 11.4*  HCT 34.4* 33.4* 34.0*  MCV 84.3 83.3 82.5  PLT 140* 118* 118*   Basic Metabolic Panel  Recent Labs  11/23/12 1516 11/23/12 2237 11/24/12 0955  NA 130*  --  132*  K 4.0  --  4.1  CL 92*  --  95*  CO2 26  --  28  GLUCOSE 113*  --  125*  BUN 31*  --  34*  CREATININE 1.61*  --  1.52*  CALCIUM 8.9  --  8.8  MG  --  2.3  --    Liver Function Tests No results found for this basename: AST, ALT, ALKPHOS, BILITOT, PROT, ALBUMIN,  in the last 72 hours No results found for this basename: LIPASE, AMYLASE,  in the last 72 hours Cardiac Enzymes  Recent Labs  11/23/12 1847 11/23/12 2237 11/24/12 0955  TROPONINI <0.30 <0.30 <0.30   BNP No components found with this basename: POCBNP,  D-Dimer No results found for this basename: DDIMER,  in the last 72 hours Hemoglobin A1C  Recent Labs  11/23/12 2237  HGBA1C 6.5*   Fasting Lipid Panel  Recent Labs  11/24/12 0955  CHOL 135  HDL 42  LDLCALC 73  TRIG 100  CHOLHDL 3.2   Thyroid Function Tests  Recent Labs  11/23/12 2237  TSH 4.416    Radiology/Studies  Dg Chest Portable 1 View  11/23/2012  *RADIOLOGY REPORT*  Clinical Data: Chest pain.  PORTABLE CHEST - 1 VIEW  Comparison: 08/30/2012  Findings: Portable view of the chest demonstrates a left cardiac pacemaker.  Heart size is mildly enlarged but unchanged.  There is no focal airspace disease or edema.  Old post-traumatic changes in the proximal left femur.  IMPRESSION: No acute cardiopulmonary disease.  Cardiomegaly.   Original Report Authenticated By: Richarda Overlie, M.D.     ASSESSMENT AND PLAN  1.  Chest pain -- ? Uncertain etiology--not clear USAP  ? MS 2.  New maculopapular rash  --  Exposure to plavix.  She did not remember plavix, but now  remembers that she developed a diffuse rash with sores after exposure in 1999.   3.  Hypertension 4.  Hyperlipidemia 5.  History of heparin induced thrombocytopenia. 6.  Ischemic CM.     Plan  Monitor today.  See if rash resolves.  Ambulate.  Watch.  2D echo.   Signed, Shawnie Pons MD, [facility], FSCAI

## 2012-11-25 NOTE — Progress Notes (Signed)
  Echocardiogram 2D Echocardiogram has been performed.  [name] [name] 11/25/2012, 1:44 PM

## 2012-11-26 LAB — BASIC METABOLIC PANEL
CO2: 32 mEq/L (ref 19–32)
Chloride: 95 mEq/L — ABNORMAL LOW (ref 96–112)
Creatinine, Ser: 1.6 mg/dL — ABNORMAL HIGH (ref 0.50–1.10)
Glucose, Bld: 84 mg/dL (ref 70–99)

## 2012-11-26 LAB — CBC
Hemoglobin: 10.5 g/dL — ABNORMAL LOW (ref 12.0–15.0)
MCH: 27.8 pg (ref 26.0–34.0)
MCV: 82.3 fL (ref 78.0–100.0)
Platelets: 118 10*3/uL — ABNORMAL LOW (ref 150–400)
RBC: 3.78 MIL/uL — ABNORMAL LOW (ref 3.87–5.11)
WBC: 4.3 10*3/uL (ref 4.0–10.5)

## 2012-11-26 MED ORDER — CIPROFLOXACIN HCL 250 MG PO TABS: 250.0000 mg | ORAL_TABLET | Freq: Two times a day (BID) | ORAL | Status: DC

## 2012-11-26 MED ORDER — DIPHENHYDRAMINE HCL 25 MG PO CAPS
50.0000 mg | ORAL_CAPSULE | Freq: Four times a day (QID) | ORAL | Status: DC | PRN
  Administered 2012-11-26 – 2012-11-27 (×4): 50 mg via ORAL
  Filled 2012-11-26 (×4): qty 2

## 2012-11-26 MED ORDER — CIPROFLOXACIN HCL 250 MG PO TABS
250.0000 mg | ORAL_TABLET | Freq: Every day | ORAL | Status: DC
  Administered 2012-11-26 – 2012-11-27 (×2): 250 mg via ORAL
  Filled 2012-11-26 (×4): qty 1

## 2012-11-26 MED ORDER — CIPROFLOXACIN HCL 250 MG PO TABS: 250.0000 mg | ORAL_TABLET | Freq: Every day | ORAL | Status: DC

## 2012-11-26 MED ORDER — MORPHINE SULFATE 2 MG/ML IJ SOLN: 2.0000 mg | Freq: Two times a day (BID) | INTRAMUSCULAR | Status: DC | PRN

## 2012-11-26 NOTE — Progress Notes (Signed)
Pharmacist Heart Failure Core Measure Documentation  Assessment: [name] [name] has an EF documented as 30-35% on 11/25/12 by ECHO.  Rationale: Heart failure patients with left ventricular systolic dysfunction (LVSD) and an EF < 40% should be prescribed an angiotensin converting enzyme inhibitor (ACEI) or angiotensin receptor blocker (ARB) at discharge unless a contraindication is documented in the medical record.  This patient is not currently on an ACEI or ARB for HF.  This note is being placed in the record in order to provide documentation that a contraindication to the use of these agents is present for this encounter.  ACE Inhibitor or Angiotensin Receptor Blocker is contraindicated (specify all that apply)  []   ACEI allergy AND ARB allergy []   Angioedema []   Moderate or severe aortic stenosis []   Hyperkalemia []   Hypotension []   Renal artery stenosis [x]   Worsening renal function, preexisting renal disease or dysfunction   Fredrik Rigger 11/26/2012 1:45 PM

## 2012-11-26 NOTE — Progress Notes (Signed)
  Amiodarone Drug - Drug Interaction Consult Note  Recommendations:  Amiodarone is metabolized by the cytochrome P450 system and therefore has the potential to cause many drug interactions. Amiodarone has an average plasma half-life of 50 days (range 20 to 100 days).   There is potential for drug interactions to occur several weeks or months after stopping treatment and the onset of drug interactions may be slow after initiating amiodarone.   [x]  Statins: Increased risk of myopathy. Simvastatin- restrict dose to 20mg  daily. ** Pt is on Lipitor  Other statins: counsel patients to report any muscle pain or weakness immediately.  []  Anticoagulants: Amiodarone can increase anticoagulant effect. Consider warfarin dose reduction. Patients should be monitored closely and the dose of anticoagulant altered accordingly, remembering that amiodarone levels take several weeks to stabilize.  []  Antiepileptics: Amiodarone can increase plasma concentration of phenytoin, the dose should be reduced. Note that small changes in phenytoin dose can result in large changes in levels. Monitor patient and counsel on signs of toxicity.  [x]  Beta blockers: increased risk of bradycardia, AV block and myocardial depression. Sotalol - avoid concomitant use. **Pt is on Carvedilol  []   Calcium channel blockers (diltiazem and verapamil): increased risk of bradycardia, AV block and myocardial depression.  []   Cyclosporine: Amiodarone increases levels of cyclosporine. Reduced dose of cyclosporine is recommended.  []  Digoxin dose should be halved when amiodarone is started.  [x]  Diuretics: increased risk of cardiotoxicity if hypokalemia occurs.  []  Oral hypoglycemic agents (glyburide, glipizide, glimepiride): increased risk of hypoglycemia. Patient's glucose levels should be monitored closely when initiating amiodarone therapy.   [x]  Drugs that prolong the QT interval:  Torsades de pointes risk may be increased with  concurrent use - avoid if possible.  Monitor QTc, also keep magnesium/potassium WNL if concurrent therapy can't be avoided. Marland Kitchen Antibiotics: e.g. fluoroquinolones, erythromycin.  **Note:  Cipro started without stop date.  . Antiarrhythmics: e.g. quinidine, procainamide, disopyramide, sotalol. . Antipsychotics: e.g. phenothiazines, haloperidol.  . Lithium, tricyclic antidepressants, and methadone.  Thank You,   Marisue Humble, PharmD Clinical Pharmacist [facility]

## 2012-11-26 NOTE — Progress Notes (Signed)
Patient Name: [name] [name] Date of Encounter: 11/26/2012     Active Problems:   Chest pain at rest   Rash    SUBJECTIVE  No more chest pain, and her rash is dramatically improved.  Likely an eruption to plavix temporally, and she now remembers a prior exposure with similar consequences.  Denies any chest pain and seems a lot better.    CURRENT MEDS . ALPRAZolam  0.25 mg Oral QHS  . amiodarone  100 mg Oral QHS  . aspirin EC  81 mg Oral Daily  . atorvastatin  10 mg Oral q1800  . carvedilol  12.5 mg Oral BID WC  . ciprofloxacin  250 mg Oral BID  . docusate sodium  200 mg Oral BID  . furosemide  60 mg Oral BID  . isosorbide mononitrate  60 mg Oral Daily  . levothyroxine  75 mcg Oral Daily  . pantoprazole  40 mg Oral Daily  . senna-docusate  1 tablet Oral BID  . tamsulosin  0.4 mg Oral Daily  . Vitamin D (Ergocalciferol)  50,000 Units Oral Q7 days    OBJECTIVE  Filed Vitals:   11/26/12 0100 11/26/12 0300 11/26/12 0436 11/26/12 0740  BP:   135/68 147/60  Pulse:   70 70  Temp:   97.5 F (36.4 C) 97.7 F (36.5 C)  TempSrc:   Oral Oral  Resp:   22 18  Height:      Weight:      SpO2: 95% 92% 96% 96%    Intake/Output Summary (Last 24 hours) at 11/26/12 1013 Last data filed at 11/26/12 0700  Gross per 24 hour  Intake    240 ml  Output   1200 ml  Net   -960 ml   Filed Weights   11/23/12 1510 11/23/12 2006  Weight: 112 lb (50.803 kg) 110 lb 14.3 oz (50.3 kg)    PHYSICAL EXAM  General: Thin elderly female.   Neuro: Alert and oriented X 3. Moves all extremities spontaneously. Psych: Normal affect. HEENT:  Normal  Neck: Supple without bruits or JVD. Lungs:  Resp regular and unlabored, CTA. Heart: RRR no s3, s4, or murmurs. Abdomen: Soft, non-tender, non-distended, BS + x 4.  Extremities: No clubbing, cyanosis or edema. DP/PT/Radials 2+ and equal bilaterally. Skin;  Fine maculopapular rash improved from onset.  Still some anterior areas of redness.  Better.   Itching of lesions resolved.    Accessory Clinical Findings  CBC  Recent Labs  11/23/12 1516  11/25/12 0510 11/26/12 0415  WBC 4.1  < > 4.1 4.3  NEUTROABS 2.6  --   --   --   HGB 11.3*  < > 11.4* 10.5*  HCT 34.4*  < > 34.0* 31.1*  MCV 84.3  < > 82.5 82.3  PLT 140*  < > 118* 118*  < > = values in this interval not displayed. Basic Metabolic Panel  Recent Labs  11/23/12 2237 11/24/12 0955 11/26/12 0415  NA  --  132* 134*  K  --  4.1 3.9  CL  --  95* 95*  CO2  --  28 32  GLUCOSE  --  125* 84  BUN  --  34* 32*  CREATININE  --  1.52* 1.60*  CALCIUM  --  8.8 8.9  MG 2.3  --   --    Liver Function Tests No results found for this basename: AST, ALT, ALKPHOS, BILITOT, PROT, ALBUMIN,  in the last 72 hours No results  found for this basename: LIPASE, AMYLASE,  in the last 72 hours Cardiac Enzymes  Recent Labs  11/23/12 1847 11/23/12 2237 11/24/12 0955  TROPONINI <0.30 <0.30 <0.30   BNP No components found with this basename: POCBNP,  D-Dimer No results found for this basename: DDIMER,  in the last 72 hours Hemoglobin A1C  Recent Labs  11/23/12 2237  HGBA1C 6.5*   Fasting Lipid Panel  Recent Labs  11/24/12 0955  CHOL 135  HDL 42  LDLCALC 73  TRIG 100  CHOLHDL 3.2   Thyroid Function Tests  Recent Labs  11/23/12 2237  TSH 4.416    TELE  Paced rhythm.    ECHO  Impressions:  - Normal LV size with mild LV hypertrophy. EF 30-35% with inferior, posterior, and apical severe hypokinesis. Moderate diastolic dysfunction with evidence for elevated LV filling pressure. Normal RV size with mildly decreased RV systolic function. Mild to moderate MR. Mild pulmonary hypertension.      Radiology/Studies  Dg Chest Portable 1 View  11/23/2012  *RADIOLOGY REPORT*  Clinical Data: Chest pain.  PORTABLE CHEST - 1 VIEW  Comparison: 08/30/2012  Findings: Portable view of the chest demonstrates a left cardiac pacemaker.  Heart size is mildly enlarged but  unchanged.  There is no focal airspace disease or edema.  Old post-traumatic changes in the proximal left femur.  IMPRESSION: No acute cardiopulmonary disease.  Cardiomegaly.   Original Report Authenticated By: Richarda Overlie, M.D.     ASSESSMENT AND PLAN  1.  Ischemic CM with improved symptoms--appears stable 2.  Rash from plavix---likely, improved 3.  Chest pain  ---  Resolved without recurrence.  4.  New onset of UTI  --  Will add Cipro  Plan transfer to floor today with plans likely for dc back to nursing facility tomorrow.    Signed, Shawnie Pons MD, [facility], FSCAI

## 2012-11-27 ENCOUNTER — Inpatient Hospital Stay
Admission: RE | Admit: 2012-11-27 | Discharge: 2013-01-10 | Disposition: A | Discharge status: Other Acute Inpatient Hospital | Payer: Medicare Other | Source: Ambulatory Visit | Attending: Internal Medicine | Admitting: Internal Medicine

## 2012-11-27 DIAGNOSIS — R339 Retention of urine, unspecified: Principal | ICD-10-CM

## 2012-11-27 LAB — CBC
HCT: 34 % — ABNORMAL LOW (ref 36.0–46.0)
MCH: 27.2 pg (ref 26.0–34.0)
MCV: 81.9 fL (ref 78.0–100.0)
Platelets: 161 10*3/uL (ref 150–400)
RBC: 4.15 MIL/uL (ref 3.87–5.11)

## 2012-11-27 LAB — BASIC METABOLIC PANEL
CO2: 32 mEq/L (ref 19–32)
Chloride: 96 mEq/L (ref 96–112)
Sodium: 136 mEq/L (ref 135–145)

## 2012-11-27 MED ORDER — OMEPRAZOLE 40 MG PO CPDR: 40.0000 mg | DELAYED_RELEASE_CAPSULE | Freq: Every day | ORAL | Status: AC

## 2012-11-27 MED ORDER — ISOSORBIDE MONONITRATE ER 60 MG PO TB24: 60.0000 mg | ORAL_TABLET | Freq: Every day | ORAL | Status: DC

## 2012-11-27 MED ORDER — CIPROFLOXACIN HCL 250 MG PO TABS: 250.0000 mg | ORAL_TABLET | Freq: Every day | ORAL | Status: DC

## 2012-11-27 NOTE — Discharge Summary (Signed)
CARDIOLOGY DISCHARGE SUMMARY   Patient ID: [name] [name] MRN: [id number] DOB/AGE: [id number] 77 y.o.  Admit date: 11/23/2012 Discharge date: 11/27/2012  Primary Discharge Diagnosis:     Angina at rest Secondary Discharge Diagnosis:    DM   HYPERTENSION   Rash  Procedures:  2-D echocardiogram  Hospital Course: [name] [name] is a 79 y.o. female with a history of CAD. She had chest pain which started at rest and was transferred from [facility] to [facility] emergency room and then to [facility] where she was admitted for further evaluation and treatment.  Her chest pain resolved with sublingual nitroglycerin and aspirin. She had been on Imdur prior to admission and this was increased to 60 mg daily. She was started on Plavix. Her heart failure status was monitored carefully but her weight was stable. A lipid profile was checked and she was continued on a statin. Her serum creatinine was slightly elevated on admission at 1.6. She has chronic kidney disease stage IV but returned to baseline prior to discharge with discharge labs seen below.  [name] [name] cardiac enzymes were negative for MI. Her symptoms improved with the current treatment. She had an echocardiogram which showed an EF of 30-35%. Conservative therapy was felt the best option and no further ischemic evaluation was indicated. She developed a rash which was felt secondary to Plavix. Plavix was discontinued. She had a urinalysis checked and was started on Cipro for a UTI. Her UTI symptoms improved.  A 11/27/2012, [name] [name] was felt to be stabilized and considered appropriate for discharge back to the skilled nursing facility in improved condition, to follow up in the [place] office.  Labs:   Lab Results  Component Value Date   WBC 5.1 11/27/2012   HGB 11.3* 11/27/2012   HCT 34.0* 11/27/2012   MCV 81.9 11/27/2012   PLT 161 11/27/2012     Recent Labs Lab 11/27/12 0550  NA 136  K 3.8  CL 96  CO2 32  BUN 35*   CREATININE 1.41*  CALCIUM 9.4  GLUCOSE 91   Lipid Panel     Component Value Date/Time   CHOL 135 11/24/2012 0955   TRIG 100 11/24/2012 0955   HDL 42 11/24/2012 0955   CHOLHDL 3.2 11/24/2012 0955   VLDL 20 11/24/2012 0955   LDLCALC 73 11/24/2012 0955    Pro B Natriuretic peptide (BNP)  Date/Time Value Range Status  11/23/2012  3:16 PM 6161.0* 0 - 450 pg/mL Final  09/04/2012  6:10 AM 7008.0* 0 - 450 pg/mL Final   Urinalysis    Component Value Date/Time   COLORURINE YELLOW 11/25/2012 1815   APPEARANCEUR CLOUDY* 11/25/2012 1815   LABSPEC 1.010 11/25/2012 1815   PHURINE 6.5 11/25/2012 1815   GLUCOSEU NEGATIVE 11/25/2012 1815   HGBUR NEGATIVE 11/25/2012 1815   BILIRUBINUR NEGATIVE 11/25/2012 1815   KETONESUR NEGATIVE 11/25/2012 1815   PROTEINUR NEGATIVE 11/25/2012 1815   UROBILINOGEN 0.2 11/25/2012 1815   NITRITE POSITIVE* 11/25/2012 1815   LEUKOCYTESUR MODERATE* 11/25/2012 1815    Radiology: Dg Chest Portable 1 View 11/23/2012  *RADIOLOGY REPORT*  Clinical Data: Chest pain.  PORTABLE CHEST - 1 VIEW  Comparison: 08/30/2012  Findings: Portable view of the chest demonstrates a left cardiac pacemaker.  Heart size is mildly enlarged but unchanged.  There is no focal airspace disease or edema.  Old post-traumatic changes in the proximal left femur.  IMPRESSION: No acute cardiopulmonary disease.  Cardiomegaly.   Original Report Authenticated By: Richarda Overlie,  M.D.     EKG: 25-Nov-2012 06:53:40 [facility] ROUTINE RECORD AV dual-paced rhythm with prolonged AV conduction Abnormal ECG 71mm/s 11mm/mV 100Hz  8.0.1 12SL 241 HD CID: 1 Referred by: Confirmed By: Marca Ancona MD Vent. rate 71 BPM PR interval 210 ms QRS duration 170 ms QT/QTc 490/532 ms P-R-T axes 51 -74 92  Echo: 11/25/2012 Conclusions - Left ventricle: The cavity size was normal. Wall thickness was increased in a pattern of mild LVH. Systolic function was moderately to severely reduced. The estimated  ejection fraction was in the range of 30% to 35%. Inferior, posterior, and apical severe hypokinesis. Features are consistent with a pseudonormal left ventricular filling pattern, with concomitant abnormal relaxation and increased filling pressure (grade 2 diastolic dysfunction). E/medial e' > 15 suggests LV end diastolic pressure at least 20 mmHg. - Aortic valve: There was no stenosis. Mild regurgitation. - Mitral valve: Mild to moderate regurgitation. - Left atrium: The atrium was mildly to moderately dilated. - Right ventricle: The cavity size was normal. Systolic function was mildly reduced. - Tricuspid valve: Peak RV-RA gradient: 39mm Hg (S). - Pulmonary arteries: PA peak pressure: 44mm Hg (S). - Inferior vena cava: The vessel was normal in size; the respirophasic diameter changes were in the normal range (= 50%); findings are consistent with normal central venous pressure. Impressions: - Normal LV size with mild LV hypertrophy. EF 30-35% with inferior, posterior, and apical severe hypokinesis. Moderate diastolic dysfunction with evidence for elevated LV filling pressure. Normal RV size with mildly decreased RV systolic function. Mild to moderate MR. Mild pulmonary hypertension.  FOLLOW UP PLANS AND APPOINTMENTS Allergies  Allergen Reactions  . Altace (Ramipril) Hives  . Contrast Media (Iodinated Diagnostic Agents) Other (See Comments)    Reaction noted by md-unknown reaction per family  . Heparin Other (See Comments)    Clots blood instead of thinning  . Hydromorphone Hcl Other (See Comments)    Crazy feeling     Medication List    TAKE these medications       acetaminophen 325 MG tablet  Commonly known as:  TYLENOL  Take 2 tablets (650 mg total) by mouth every 4 (four) hours as needed for pain or fever.     ALPRAZolam 0.5 MG tablet  Commonly known as:  XANAX  Take 0.25 mg by mouth 2 (two) times daily as needed for anxiety. Do not administer Alprazolam and  Hydrocodone together!!!     ALPRAZolam 0.5 MG tablet  Commonly known as:  XANAX  Take 0.25 mg by mouth at bedtime.     alum & mag hydroxide-simeth 200-200-20 MG/5ML suspension  Commonly known as:  MAALOX/MYLANTA  Take 30 mLs by mouth every 4 (four) hours as needed for indigestion.     amiodarone 200 MG tablet  Commonly known as:  PACERONE  Take 100 mg by mouth at bedtime.     aspirin EC 81 MG tablet  Take 1 tablet (81 mg total) by mouth daily.     atorvastatin 10 MG tablet  Commonly known as:  LIPITOR  Take 1 tablet (10 mg total) by mouth daily at 6 PM.     carvedilol 12.5 MG tablet  Commonly known as:  COREG  Take 1 tablet (12.5 mg total) by mouth 2 (two) times daily with a meal.     ciprofloxacin 250 MG tablet  Commonly known as:  CIPRO  Take 1 tablet (250 mg total) by mouth daily with breakfast.     docusate sodium 100  MG capsule  Commonly known as:  COLACE  Take 2 capsules (200 mg total) by mouth 2 (two) times daily.     furosemide 20 MG tablet  Commonly known as:  LASIX  Take 60 mg by mouth 2 (two) times daily.     HYDROcodone-acetaminophen 10-325 MG per tablet  Commonly known as:  NORCO  Take 1 tablet by mouth every 6 (six) hours as needed for pain. Do Not Administer Alprazolam and Hydrocodone together!!!     isosorbide mononitrate 60 MG 24 hr tablet  Commonly known as:  IMDUR  Take 1 tablet (60 mg total) by mouth daily.     lactulose 10 GM/15ML solution  Commonly known as:  CHRONULAC  Take 30 g by mouth 2 (two) times daily as needed. constipation     levothyroxine 75 MCG tablet  Commonly known as:  SYNTHROID, LEVOTHROID  Take 75 mcg by mouth daily.     nitroGLYCERIN 0.4 MG SL tablet  Commonly known as:  NITROSTAT  Place 0.4 mg under the tongue every 5 (five) minutes as needed. For chest pain     omeprazole 40 MG capsule  Commonly known as:  PRILOSEC  Take 1 capsule (40 mg total) by mouth daily.     senna-docusate 8.6-50 MG per tablet  Commonly  known as:  Senokot-S  Take 1 tablet by mouth 2 (two) times daily.     tamsulosin 0.4 MG Caps  Commonly known as:  FLOMAX  Take 1 capsule (0.4 mg total) by mouth daily.     VITAMIN B-12 IJ  Inject 1,000 mcg as directed every 14 (fourteen) days. Administered at MD office     Vitamin D (Ergocalciferol) 50000 UNITS Caps  Commonly known as:  DRISDOL  Take 50,000 Units by mouth every 7 (seven) days. Takes on Wed        Discharge Orders   Future Appointments Provider Department Dept Phone   12/03/2012 1:00 PM Dyann Kief, PA-C [facility] [phone]   Future Orders Complete By Expires     Diet - low sodium heart healthy  As directed     Diet Carb Modified  As directed     Increase activity slowly  As directed       Follow-up Information   Follow up with Jacolyn Reedy, PA-C On 12/03/2012. (at 1:00 pm)    Contact information:   [facility] [place] [address] [place], [place] [phone]  [address] [place], [address] [place] [address] [phone]       BRING ALL MEDICATIONS WITH YOU TO FOLLOW UP APPOINTMENTS  Time spent with patient to include physician time:  31 min Signed: Theodore Demark, PA-C 11/27/2012, 11:10 AM Co-Sign MD

## 2012-11-27 NOTE — Progress Notes (Signed)
OK per MD for d/c back to [facility] today via EMS. Patient and her daugther Elita Quick are aware and agree to d/c. [facility].  Pt's nurse- Otelia Santee is aware of d/c. No further CSW needs identified.  Lorri Frederick. [place]  [phone]  (assisting unit CSW)

## 2012-11-27 NOTE — Progress Notes (Signed)
Patient Name: [name] [name] Date of Encounter: 11/27/2012     Active Problems:   Chest pain at rest   Rash    SUBJECTIVE  She feels much better and would like to get back to her facility.  She resides in a nursing facility, and is scheduled to return there.  No further chest pain.  Spoke with her daughter.  Rash is better.  Urine feels better.    CURRENT MEDS . ALPRAZolam  0.25 mg Oral QHS  . amiodarone  100 mg Oral QHS  . aspirin EC  81 mg Oral Daily  . atorvastatin  10 mg Oral q1800  . carvedilol  12.5 mg Oral BID WC  . ciprofloxacin  250 mg Oral Q breakfast  . docusate sodium  200 mg Oral BID  . furosemide  60 mg Oral BID  . isosorbide mononitrate  60 mg Oral Daily  . levothyroxine  75 mcg Oral Daily  . pantoprazole  40 mg Oral Daily  . senna-docusate  1 tablet Oral BID  . tamsulosin  0.4 mg Oral Daily  . Vitamin D (Ergocalciferol)  50,000 Units Oral Q7 days    OBJECTIVE  Filed Vitals:   11/26/12 1142 11/26/12 1445 11/26/12 2042 11/27/12 0648  BP: 117/59 154/74 132/62 136/71  Pulse: 73 70 70 77  Temp: 97.5 F (36.4 C) 98.2 F (36.8 C) 97.5 F (36.4 C) 97.2 F (36.2 C)  TempSrc: Axillary Oral Oral Oral  Resp: 14 18    Height: 5\' 4"  (1.626 m)     Weight: 114 lb (51.71 kg)   108 lb 4.8 oz (49.125 kg)  SpO2: 96% 97% 94% 97%    Intake/Output Summary (Last 24 hours) at 11/27/12 0935 Last data filed at 11/26/12 2200  Gross per 24 hour  Intake    360 ml  Output    950 ml  Net   -590 ml   Filed Weights   11/23/12 2006 11/26/12 1142 11/27/12 0648  Weight: 110 lb 14.3 oz (50.3 kg) 114 lb (51.71 kg) 108 lb 4.8 oz (49.125 kg)    PHYSICAL EXAM  General: Pleasant, NAD. Neuro: Alert and oriented X 3. Moves all extremities spontaneously. Psych: Normal affect. HEENT:  Normal  Neck: Supple without bruits or JVD. Lungs:  Resp regular and unlabored, CTA. Heart: RRR no s3, s4, or murmurs.  Extremities: No clubbing, cyanosis or edema. DP/PT/Radials 2+ and equal  bilaterally.  Some bruising.    Accessory Clinical Findings  CBC  Recent Labs  11/26/12 0415 11/27/12 0550  WBC 4.3 5.1  HGB 10.5* 11.3*  HCT 31.1* 34.0*  MCV 82.3 81.9  PLT 118* 161   Basic Metabolic Panel  Recent Labs  11/26/12 0415 11/27/12 0550  NA 134* 136  K 3.9 3.8  CL 95* 96  CO2 32 32  GLUCOSE 84 91  BUN 32* 35*  CREATININE 1.60* 1.41*  CALCIUM 8.9 9.4   Liver Function Tests No results found for this basename: AST, ALT, ALKPHOS, BILITOT, PROT, ALBUMIN,  in the last 72 hours No results found for this basename: LIPASE, AMYLASE,  in the last 72 hours Cardiac Enzymes  Recent Labs  11/24/12 0955  TROPONINI <0.30   BNP No components found with this basename: POCBNP,  D-Dimer No results found for this basename: DDIMER,  in the last 72 hours Hemoglobin A1C No results found for this basename: HGBA1C,  in the last 72 hours Fasting Lipid Panel  Recent Labs  11/24/12 0955  CHOL  135  HDL 42  LDLCALC 73  TRIG 100  CHOLHDL 3.2   Thyroid Function Tests No results found for this basename: TSH, T4TOTAL, FREET3, T3FREE, THYROIDAB,  in the last 72 hours   Radiology/Studies  Dg Chest Portable 1 View  11/23/2012  *RADIOLOGY REPORT*  Clinical Data: Chest pain.  PORTABLE CHEST - 1 VIEW  Comparison: 08/30/2012  Findings: Portable view of the chest demonstrates a left cardiac pacemaker.  Heart size is mildly enlarged but unchanged.  There is no focal airspace disease or edema.  Old post-traumatic changes in the proximal left femur.  IMPRESSION: No acute cardiopulmonary disease.  Cardiomegaly.   Original Report Authenticated By: Richarda Overlie, M.D.    Impression and Plan  1.  Chest pain  -  Somewhat atypical neg enzymes, no recurrence --- continue observation 2.  Rash on plavix--add to list of allergies.  3.  UTI  -- started on Cipro--should be treated for one week.  4.  Cardiomyopathy  ---  Stable.  EF down slightly but only by visual estimate---  Mod MR.' 5.  CKD,  stage 4,  --renovascular with prior stent-----not a candidate for ACE/ARB. .     Can dc to nursing facility.  Needs early fu with Dr. Antoine Poche in [place].    Signed, Shawnie Pons MD, [facility], FSCAI

## 2012-11-27 NOTE — Discharge Summary (Signed)
Discussed with daughter this am in detail.  She is being discharged back to SNF.  She will need fu with Dr. Antoine Poche after discharge.  TS

## 2012-11-27 NOTE — Progress Notes (Signed)
Reviewed discharge instructions with patient and she stated her understanding.  Report called to nurse at [facility].  Patient discharged via [facility] to snf.  Colman Cater

## 2012-11-28 LAB — URINE CULTURE

## 2012-12-01 ENCOUNTER — Non-Acute Institutional Stay (SKILLED_NURSING_FACILITY): Payer: Medicare Other | Admitting: Internal Medicine

## 2012-12-01 DIAGNOSIS — N390 Urinary tract infection, site not specified: Secondary | ICD-10-CM

## 2012-12-01 DIAGNOSIS — I248 Other forms of acute ischemic heart disease: Secondary | ICD-10-CM

## 2012-12-01 DIAGNOSIS — I509 Heart failure, unspecified: Secondary | ICD-10-CM

## 2012-12-01 DIAGNOSIS — I5042 Chronic combined systolic (congestive) and diastolic (congestive) heart failure: Secondary | ICD-10-CM

## 2012-12-01 DIAGNOSIS — N189 Chronic kidney disease, unspecified: Secondary | ICD-10-CM

## 2012-12-01 NOTE — Progress Notes (Signed)
Patient ID: [name] [name], female   DOB: [birth year], 79 y.o.   MRN: [id number] Chief complaint; admission, the [facility] 11/23/2012 through 11/27/2012.  History; this is an 79 year old woman who came to [place] initially after a stay in [place] from 1/12 through 08/29/2012. This admission was due to heart failure. She was rapidly rehospitalized also due to heart failure. Her other medical issues included urinary retention, falls, left shoulder pain.  On the day of this admission. The patient complained of chest pain. Ultimately, she resolved with nitroglycerin, and aspirin. She had been on IMDUR before and this was increased to 60 mg daily. She was started on Plavix however, developed a rash that was felt secondary to platelet so it was discontinued. Her cardiac enzymes were negative. She had an echocardiogram, which showed an EF of 30-35%. Conservative therapy was felt to be the best option for her further care.   Past medical history/problem list. #1 coronary artery disease with a history of congestive heart failure, status post pacemaker insertion. I do not actually see her coronary anatomy however medical therapy was felt to be all that was required on this admit. I note her echo showing an EF of 30-35%, inferior, posterior and apical severe hypokinesis, mild left ventricular hypertrophy. Moderate diastolic filling dysfunction. #2 left shoulder pain. Follows with Dr. Hilda Lias. Osteoarthritis, and I believe, a past history of torn rotator cuff. #3 history of multiple falls. #4 urinary retention #5 recurrent UTIs in fact, a urine culture are on 11/23/2012 showed ESBL Escherichia coli UTI. #6 hypothyroidism. #7 anemia secondary to B12 deficiency. #8 chronic renal failure stage III or 4 BUN was 35, creatinine 1.4, 1 on 11/27/2012.  #9 hyperlipidemia. LDL in the hospital was 70.  Discharge medication list is reviewed; her nursing home MAR. Most notable for Lasix 60 mg twice a day, carvedilol 12.5  twice a day, atorvastatin 10 daily, amiodarone, 100 mg at bedtime, ASA 81 daily  Social history: As noted. The patient has been here since January. She is a no code as a [address] most form in place. Previous to this she lived with her daughter. She has a history of falls, but is not using an ambulatory assist device.   Family History  Problem Relation Age of Onset  . Cancer Mother 33    died  . Lung disease Father 37    died  . Heart disease Sister     2 sisters and her son  . Anesthesia problems Neg Hx      Review of systems;   Cardiac-she is not complaining of exertional chest pain. The pain that she had before. She went to hospital on this occasion is described as a "smothering sensation". This is relieved with nitroglycerin. Respiratory-no shortness of breath. Abdomen-no nausea, vomiting, abdominal pain, or change in bowel habits. HOWEVER, patient does describe multiple episodes of vomiting in the hospital, which are not present in her discharge summary GU not describing voiding difficulties. Musculoskeletal still complaining of left shoulder pain apparently previously injected by Dr. Hilda Lias.  Physical exam; blood pressure 135/82, pulse 70, respirations 18, temperature 98.4 , saturations on 2 L, 97%. Weight 112.2 pounds HEENT no oral lesions are seen. Respiratory clear entry bilaterally. No crackles or wheezes. No accessory muscle use. Cardiac heart sounds are normal. There is a benign sounding 2/6 midsystolic murmur. JVD is not elevated. Abdomen; she is reproducibly tender in the epigastric area. No guarding or rebound. No liver or spleen Skin multiple ecchymosis. But no lesions seen.  Impression/plan #1 coronary artery disease, presenting with unstable angina. Enzymes were negative. Pain relieved by one nitroglycerin. However, this pain apparently occurred at rest. Presumably there is nothing further that can be done invasively about this. She has had her Imdur  increase. He is on when necessary nitroglycerin. On carvedilol at 12.5 twice a day. Aspirin enteric-coated, 81 daily. Did not tolerate plavix.  #2 congestive heart failure on Lasix 60 mg twice a day. There is no evidence of this currently. Her discharge weight is listed at 110 pounds. Currently, at 112. This will need to be monitored carefully. Previously elevated BNP's have not correlated well with clinical evidence of heart failure #3 chronic renal insufficiency discharge creatinine of 1.6. #4 UTI Escherichia coli on ciprofloxacin. #5 urinary retention. She is on her, and has been on Flomax. We'll need to see, if we've had  ultrasound on her. #6 hyperlipidemia on a statin. #7 hypothyroidism on replacement. Gastroesophageal reflux on omeprazole. #8 vitamin B12 deficiency.  This patient will require jarful cardiac monitor. Also monitoring of her renal function. I will see if we have a bladder ultrasound if she does have a history of urinary retention and appears to be having recurrent UTIs or at least urine cultures showing bacteria. Finally, she has gait imbalance problems, but is not using an ambulatory assist device. I will check into this as well as where we were with discharge planning. Assuming there was a plan to discharge her

## 2012-12-02 ENCOUNTER — Ambulatory Visit (HOSPITAL_COMMUNITY)
Admit: 2012-12-02 | Discharge: 2012-12-02 | Disposition: A | Discharge status: Home / Self Care | Payer: Medicare Other | Source: Skilled Nursing Facility | Attending: Internal Medicine | Admitting: Internal Medicine

## 2012-12-02 DIAGNOSIS — R339 Retention of urine, unspecified: Secondary | ICD-10-CM | POA: Insufficient documentation

## 2012-12-02 DIAGNOSIS — N329 Bladder disorder, unspecified: Secondary | ICD-10-CM | POA: Insufficient documentation

## 2012-12-03 ENCOUNTER — Encounter: Payer: Medicare Other | Admitting: Physician Assistant

## 2012-12-03 ENCOUNTER — Non-Acute Institutional Stay (SKILLED_NURSING_FACILITY): Payer: Medicare Other | Admitting: Internal Medicine

## 2012-12-03 DIAGNOSIS — N189 Chronic kidney disease, unspecified: Secondary | ICD-10-CM

## 2012-12-03 DIAGNOSIS — I248 Other forms of acute ischemic heart disease: Secondary | ICD-10-CM

## 2012-12-03 DIAGNOSIS — I2489 Other forms of acute ischemic heart disease: Secondary | ICD-10-CM

## 2012-12-03 DIAGNOSIS — I509 Heart failure, unspecified: Secondary | ICD-10-CM

## 2012-12-03 NOTE — Progress Notes (Signed)
Patient ID: [name] [name], female   DOB: 12-Dec-1924, 79 y.o.   MRN: [id number] Chief complaint; review of coronary artery disease, CHF, urinary retention.  History; one with a history of coronary artery disease. She was recently hospitalized from April 13 from April 17 with chest pain. This was felt to be angina, which I think occurred with minimal activity. Her chest pain, resolved with sublingual nitroglycerin, and aspirin. In the hospital. Her Imdur was increased from 30-60 mg daily. Her failure status was stable. Her serum creatinine was 1.6. Her cardiac enzymes were negative. She had an echocardiogram, which showed an EF of 30-35%.  Since her return back to the facility [name] [name] really has done fairly, well, we'll. She denies any form of exertional chest pain, discomfort. Basically, she has no exertional chest symptoms. Her weight has remained stable yesterday at 109.9 pounds. His is largely stable over the last 6 days. With regards to her urinary retention. She denies dysuria, frequency. Is not up frequently to avoid. Did a post void bladder ultrasound showing an estimated prevoid bladder volume of 219 mils post void of 59. This was adjusted. She does not have urinary retention. Lab work is ordered for next Monday. Followup for renal failure.  Review of systems; Respiratory no cough no shortness of breath. Cardiac absolutely no exertional chest symptoms of any description. GU no dysuria no frequency no nocturia. Neurologic states she is able to get up with a walker and go down to physical therapy for without limitation.  Physical exam; as mentioned above. Her weight is stable temperature 97.4, pulse 71, respirations 20, blood pressure 136/64. She is satting in normal range on room air. Respiratory; clear entry bilaterally. Cardiac heart sounds are normal. She has a blowing midsystolic murmur that does not radiate. No elevation of her JVD, no bruits. No S3 no S4. Abdomen no tenderness. No  masses. GU no bladder distention. Extremities no edema.  Impression/plan;  Congestive heart failure as appears to be well confidence. She is on Lasix 60 twice a day lab work to followup for next Monday. Coronary artery disease. This appears to be stable. She is not describing chest symptoms. Urinary retention on Flomax. She is not retaining urine, which really begs the question about continuing the Flomax only to see what we know about this in her record. Chronic renal failure. This will be checked next Monday.  Overall this patient is doing very well. I see very little reason to adjust any of her medications. Her cardiac status is obviously precarious but currently stable. He talks about going to an independent rest of in [place] on June 8, I doubt she will be staying here for but timeframe. He recently was living with her daughter and son-in-law in [place].

## 2013-01-10 ENCOUNTER — Encounter (HOSPITAL_COMMUNITY): Payer: Self-pay

## 2013-01-10 ENCOUNTER — Other Ambulatory Visit: Payer: Self-pay

## 2013-01-10 ENCOUNTER — Emergency Department (HOSPITAL_COMMUNITY): Payer: Medicare Other

## 2013-01-10 ENCOUNTER — Observation Stay (HOSPITAL_COMMUNITY)
Admission: EM | Admit: 2013-01-10 | Discharge: 2013-01-11 | Disposition: A | Discharge status: Skilled Nursing Facility | Payer: Medicare Other | Attending: Internal Medicine | Admitting: Internal Medicine

## 2013-01-10 DIAGNOSIS — N289 Disorder of kidney and ureter, unspecified: Secondary | ICD-10-CM

## 2013-01-10 DIAGNOSIS — R0602 Shortness of breath: Secondary | ICD-10-CM | POA: Insufficient documentation

## 2013-01-10 DIAGNOSIS — I10 Essential (primary) hypertension: Secondary | ICD-10-CM | POA: Diagnosis present

## 2013-01-10 DIAGNOSIS — R110 Nausea: Secondary | ICD-10-CM

## 2013-01-10 DIAGNOSIS — R079 Chest pain, unspecified: Principal | ICD-10-CM | POA: Diagnosis present

## 2013-01-10 DIAGNOSIS — I129 Hypertensive chronic kidney disease with stage 1 through stage 4 chronic kidney disease, or unspecified chronic kidney disease: Secondary | ICD-10-CM | POA: Insufficient documentation

## 2013-01-10 DIAGNOSIS — E039 Hypothyroidism, unspecified: Secondary | ICD-10-CM | POA: Diagnosis present

## 2013-01-10 DIAGNOSIS — I5022 Chronic systolic (congestive) heart failure: Secondary | ICD-10-CM

## 2013-01-10 DIAGNOSIS — E785 Hyperlipidemia, unspecified: Secondary | ICD-10-CM | POA: Diagnosis present

## 2013-01-10 DIAGNOSIS — M1990 Unspecified osteoarthritis, unspecified site: Secondary | ICD-10-CM | POA: Diagnosis present

## 2013-01-10 DIAGNOSIS — E119 Type 2 diabetes mellitus without complications: Secondary | ICD-10-CM | POA: Diagnosis present

## 2013-01-10 DIAGNOSIS — I701 Atherosclerosis of renal artery: Secondary | ICD-10-CM | POA: Diagnosis present

## 2013-01-10 DIAGNOSIS — I495 Sick sinus syndrome: Secondary | ICD-10-CM | POA: Diagnosis present

## 2013-01-10 DIAGNOSIS — N183 Chronic kidney disease, stage 3 unspecified: Secondary | ICD-10-CM | POA: Diagnosis present

## 2013-01-10 LAB — BASIC METABOLIC PANEL
Calcium: 9.3 mg/dL (ref 8.4–10.5)
Chloride: 94 mEq/L — ABNORMAL LOW (ref 96–112)
Creatinine, Ser: 1.47 mg/dL — ABNORMAL HIGH (ref 0.50–1.10)
GFR calc Af Amer: 35 mL/min — ABNORMAL LOW (ref >90)
GFR calc non Af Amer: 30 mL/min — ABNORMAL LOW (ref >90)

## 2013-01-10 LAB — CBC WITH DIFFERENTIAL/PLATELET
Basophils Absolute: 0 10*3/uL (ref 0.0–0.1)
Basophils Relative: 0 % (ref 0–1)
Eosinophils Absolute: 0.1 10*3/uL (ref 0.0–0.7)
Eosinophils Relative: 2 % (ref 0–5)
HCT: 33 % — ABNORMAL LOW (ref 36.0–46.0)
MCH: 29.1 pg (ref 26.0–34.0)
MCHC: 33 g/dL (ref 30.0–36.0)
Monocytes Absolute: 0.4 10*3/uL (ref 0.1–1.0)
Neutro Abs: 4 10*3/uL (ref 1.7–7.7)
RDW: 18.5 % — ABNORMAL HIGH (ref 11.5–15.5)

## 2013-01-10 LAB — HEPATIC FUNCTION PANEL
Alkaline Phosphatase: 163 U/L — ABNORMAL HIGH (ref 39–117)
Bilirubin, Direct: 0.2 mg/dL (ref 0.0–0.3)
Indirect Bilirubin: 0.1 mg/dL — ABNORMAL LOW (ref 0.3–0.9)
Total Bilirubin: 0.3 mg/dL (ref 0.3–1.2)

## 2013-01-10 LAB — LIPASE, BLOOD: Lipase: 34 U/L (ref 11–59)

## 2013-01-10 LAB — TROPONIN I: Troponin I: <0.3 ng/mL (ref <0.30)

## 2013-01-10 LAB — PROTIME-INR
INR: 1.07 (ref 0.00–1.49)
Prothrombin Time: 13.8 seconds (ref 11.6–15.2)

## 2013-01-10 LAB — LACTIC ACID, PLASMA: Lactic Acid, Venous: 2.6 mmol/L — ABNORMAL HIGH (ref 0.5–2.2)

## 2013-01-10 MED ORDER — METOCLOPRAMIDE HCL 5 MG/ML IJ SOLN: 10.0000 mg | Freq: Four times a day (QID) | INTRAMUSCULAR | Status: DC | PRN

## 2013-01-10 MED ORDER — ACETAMINOPHEN 325 MG PO TABS: 650.0000 mg | ORAL_TABLET | ORAL | Status: DC | PRN

## 2013-01-10 MED ORDER — TRAZODONE HCL 50 MG PO TABS: 50.0000 mg | ORAL_TABLET | Freq: Every evening | ORAL | Status: DC | PRN

## 2013-01-10 MED ORDER — NITROGLYCERIN 0.4 MG SL SUBL: 0.4000 mg | SUBLINGUAL_TABLET | SUBLINGUAL | Status: DC | PRN

## 2013-01-10 MED ORDER — CARVEDILOL 12.5 MG PO TABS
12.5000 mg | ORAL_TABLET | Freq: Two times a day (BID) | ORAL | Status: DC
  Administered 2013-01-11 (×2): 12.5 mg via ORAL
  Filled 2013-01-10 (×2): qty 1

## 2013-01-10 MED ORDER — TAMSULOSIN HCL 0.4 MG PO CAPS
0.4000 mg | ORAL_CAPSULE | Freq: Every day | ORAL | Status: DC
  Administered 2013-01-11: 0.4 mg via ORAL
  Filled 2013-01-10: qty 1

## 2013-01-10 MED ORDER — LEVOTHYROXINE SODIUM 75 MCG PO TABS
75.0000 ug | ORAL_TABLET | Freq: Every day | ORAL | Status: DC
  Administered 2013-01-11: 75 ug via ORAL
  Filled 2013-01-10: qty 1

## 2013-01-10 MED ORDER — MORPHINE SULFATE 4 MG/ML IJ SOLN
4.0000 mg | INTRAMUSCULAR | Status: DC | PRN
  Administered 2013-01-11: 4 mg via INTRAVENOUS
  Filled 2013-01-10: qty 1

## 2013-01-10 MED ORDER — ISOSORBIDE MONONITRATE ER 60 MG PO TB24
60.0000 mg | ORAL_TABLET | Freq: Every day | ORAL | Status: DC
  Administered 2013-01-11: 60 mg via ORAL
  Filled 2013-01-10: qty 1

## 2013-01-10 MED ORDER — FUROSEMIDE 20 MG PO TABS
60.0000 mg | ORAL_TABLET | Freq: Two times a day (BID) | ORAL | Status: DC
  Administered 2013-01-11 (×2): 60 mg via ORAL
  Filled 2013-01-10 (×3): qty 1

## 2013-01-10 MED ORDER — AMIODARONE HCL 200 MG PO TABS
100.0000 mg | ORAL_TABLET | Freq: Every day | ORAL | Status: DC
  Administered 2013-01-10: 100 mg via ORAL
  Filled 2013-01-10: qty 1

## 2013-01-10 MED ORDER — SODIUM CHLORIDE 0.9 % IJ SOLN
3.0000 mL | Freq: Two times a day (BID) | INTRAMUSCULAR | Status: DC
  Administered 2013-01-11: 3 mL via INTRAVENOUS
  Filled 2013-01-10: qty 3

## 2013-01-10 MED ORDER — MORPHINE SULFATE 4 MG/ML IJ SOLN
INTRAMUSCULAR | Status: AC
  Administered 2013-01-10: 4 mg
  Filled 2013-01-10: qty 1

## 2013-01-10 MED ORDER — FLEET ENEMA 7-19 GM/118ML RE ENEM: 1.0000 | ENEMA | Freq: Once | RECTAL | Status: AC | PRN

## 2013-01-10 MED ORDER — ONDANSETRON HCL 4 MG/2ML IJ SOLN: 4.0000 mg | INTRAMUSCULAR | Status: DC | PRN

## 2013-01-10 MED ORDER — SENNOSIDES-DOCUSATE SODIUM 8.6-50 MG PO TABS
1.0000 | ORAL_TABLET | Freq: Two times a day (BID) | ORAL | Status: DC
  Administered 2013-01-10 – 2013-01-11 (×2): 1 via ORAL
  Filled 2013-01-10 (×2): qty 1

## 2013-01-10 MED ORDER — ATORVASTATIN CALCIUM 10 MG PO TABS
10.0000 mg | ORAL_TABLET | Freq: Every day | ORAL | Status: DC
  Administered 2013-01-11: 10 mg via ORAL
  Filled 2013-01-10: qty 1

## 2013-01-10 MED ORDER — MORPHINE SULFATE 4 MG/ML IJ SOLN: 4.0000 mg | INTRAMUSCULAR | Status: DC | PRN

## 2013-01-10 MED ORDER — MORPHINE SULFATE 4 MG/ML IJ SOLN
4.0000 mg | Freq: Once | INTRAMUSCULAR | Status: AC
  Administered 2013-01-10: 4 mg via INTRAVENOUS
  Filled 2013-01-10: qty 1

## 2013-01-10 MED ORDER — SODIUM CHLORIDE 0.9 % IV SOLN: INTRAVENOUS | Status: DC

## 2013-01-10 MED ORDER — DIPHENHYDRAMINE HCL 50 MG/ML IJ SOLN
25.0000 mg | Freq: Once | INTRAMUSCULAR | Status: AC
  Administered 2013-01-10: 25 mg via INTRAVENOUS
  Filled 2013-01-10: qty 1

## 2013-01-10 MED ORDER — ASPIRIN EC 81 MG PO TBEC
81.0000 mg | DELAYED_RELEASE_TABLET | Freq: Every day | ORAL | Status: DC
  Administered 2013-01-11: 81 mg via ORAL
  Filled 2013-01-10: qty 1

## 2013-01-10 MED ORDER — DIPHENHYDRAMINE HCL 50 MG/ML IJ SOLN: 25.0000 mg | Freq: Four times a day (QID) | INTRAMUSCULAR | Status: DC | PRN

## 2013-01-10 MED ORDER — PANTOPRAZOLE SODIUM 40 MG PO TBEC
80.0000 mg | DELAYED_RELEASE_TABLET | Freq: Every day | ORAL | Status: DC
  Administered 2013-01-10 – 2013-01-11 (×2): 80 mg via ORAL
  Filled 2013-01-10 (×2): qty 2

## 2013-01-10 MED ORDER — METOCLOPRAMIDE HCL 5 MG/ML IJ SOLN
10.0000 mg | Freq: Once | INTRAMUSCULAR | Status: AC
  Administered 2013-01-10: 10 mg via INTRAVENOUS
  Filled 2013-01-10: qty 2

## 2013-01-10 NOTE — ED Provider Notes (Addendum)
History  This chart was scribed for [name] Booze, MD by [name], ED Scribe. This patient was seen in room APA02/APA02 and the patient's care was started at 5:25 PM.  CSN: [id number]  Arrival date & time 01/10/13  1721   First MD Initiated Contact with Patient 01/10/13 1725      Chief Complaint  Patient presents with  . Chest Pain    The history is provided by the patient. No language interpreter was used.    HPI Comments: [name] [name] is a 79 y.o. female brought in by ambulance from the [facility] with a h/o a pacemaker placement, who presents to the Emergency Department complaining of left sided CP described as heavy that radiates to her left mid back with associated SOB, diaphoresis and nausea that started while she was at rest. Pt denies having any modifying factors and is hesitant to rate her pain. She was given 3 SL NTG, 4 mg morphine, 4 mg Zofran and 4 baby ASA by EMS PTA with minimal improvement. She denies having a prior episode of similar symptoms this severe. She denies emesis, cough and leg swelling as associated symptoms. She is currently on an anticoagulant. She also has a HTN, HLD, COPD and CAD. Pt denies smoking and alcohol use.  Pt is a DNR.  Past Medical History  Diagnosis Date  . Coronary artery disease     a. LAD stent 1999 after NSTEMI. b. CABG 2004. c. Last LHC 02/2010 - patent grafts.  . Hypertension   . Hyperlipidemia   . Carotid artery disease     status post right carotid endarterectomy.  . Degenerative joint disease   . Restless leg syndrome   . Chronic renal insufficiency   . Heparin induced thrombocytopenia   . Diphtheria     "as a child"  . Pleural effusion, bilateral 06/2003  . Pseudoaneurysm     status post repair following catherization in 1999  . Chronic systolic heart failure 10/02/11  . Ischemic cardiomyopathy     Echocardiogram 10/01/11: Mild LVH, EF 35-40%, basal inferior, basal to mid posterior and basal to mid anterolateral  severe hypokinesis, mild to moderate AI, moderate MR (ischemic MR), moderate LAE, mild RVE, mildly reduced RV systolic function, mild RAE, PASP 43-44  . COPD (chronic obstructive pulmonary disease)   . Atrial fibrillation/flutter     s/p multiple DCCVs;  amiodarone started 09/2011, TEE DCCV 3/13;  amio and coumadin d/c'd after admxn to [facility] 02/2012 with frequent falls/syncope  . Tachycardia-bradycardia syndrome     s/p Medtronic pacemaker implant 09/2011 (8 sec pause noted)  . GERD (gastroesophageal reflux disease)   . Renal artery stenosis     bilateral- status post stenting  . Non-functioning kidney   . Depression     "cries alot"  . Gout   . Hypothyroidism   . Anemia     Past Surgical History  Procedure Laterality Date  . Carotid endarterectomy  05/2003    right  . Pacemaker insertion  10/02/11    implanted by Dr Johney Frame  . Cholecystectomy  ?02/2011  . Tonsillectomy and adenoidectomy    . Renal artery stent  06/2003    bilaterally/e-chart  . Thoracentesis  ? 2000; 05/2011  . Coronary angioplasty with stent placement  1999  . Av fistula repair  01/1998    w/pseudoaneurysm repair S/P catheterization/E-chart  . Coronary artery bypass graft  05/2003    CABG X 3  . Tee without cardioversion  10/23/2011  Procedure: TRANSESOPHAGEAL ECHOCARDIOGRAM (TEE);  Surgeon: Wendall Stade, MD;  Location: [facility] ENDOSCOPY;  Service: Cardiovascular;  Laterality: N/A;  . Cardioversion  10/23/2011    Procedure: CARDIOVERSION;  Surgeon: Wendall Stade, MD;  Location: [facility] ENDOSCOPY;  Service: Cardiovascular;  Laterality: N/A;    Family History  Problem Relation Age of Onset  . Cancer Mother 59    died  . Lung disease Father 36    died  . Heart disease Sister     2 sisters and her son  . Anesthesia problems Neg Hx     History  Substance Use Topics  . Smoking status: Never Smoker   . Smokeless tobacco: Never Used  . Alcohol Use: No    No OB history provided.  Review of Systems   Constitutional: Positive for diaphoresis. Negative for fever.  Respiratory: Positive for shortness of breath. Negative for cough.   Cardiovascular: Positive for chest pain. Negative for leg swelling.  Gastrointestinal: Positive for nausea. Negative for vomiting.  All other systems reviewed and are negative.    Allergies  Altace; Contrast media; Heparin; and Hydromorphone hcl  Home Medications   Current Outpatient Rx  Name  Route  Sig  Dispense  Refill  . acetaminophen (TYLENOL) 325 MG tablet   Oral   Take 2 tablets (650 mg total) by mouth every 4 (four) hours as needed for pain or fever.         . ALPRAZolam (XANAX) 0.5 MG tablet   Oral   Take 0.25 mg by mouth 2 (two) times daily as needed for anxiety. Do not administer Alprazolam and Hydrocodone together!!!         . ALPRAZolam (XANAX) 0.5 MG tablet   Oral   Take 0.25 mg by mouth at bedtime.         Marland Kitchen alum & mag hydroxide-simeth (MAALOX/MYLANTA) 200-200-20 MG/5ML suspension   Oral   Take 30 mLs by mouth every 4 (four) hours as needed for indigestion.   355 mL      . amiodarone (PACERONE) 200 MG tablet   Oral   Take 100 mg by mouth at bedtime.         Marland Kitchen aspirin EC 81 MG tablet   Oral   Take 1 tablet (81 mg total) by mouth daily.         Marland Kitchen atorvastatin (LIPITOR) 10 MG tablet   Oral   Take 1 tablet (10 mg total) by mouth daily at 6 PM.   30 tablet   11   . carvedilol (COREG) 12.5 MG tablet   Oral   Take 1 tablet (12.5 mg total) by mouth 2 (two) times daily with a meal.   120 tablet   3   . ciprofloxacin (CIPRO) 250 MG tablet   Oral   Take 1 tablet (250 mg total) by mouth daily with breakfast.   5 tablet   0   . Cyanocobalamin (VITAMIN B-12 IJ)   Injection   Inject 1,000 mcg as directed every 14 (fourteen) days. Administered at MD office         . docusate sodium (COLACE) 100 MG capsule   Oral   Take 2 capsules (200 mg total) by mouth 2 (two) times daily.   120 capsule   0     Follow up  with your primary care doctor for this m ...   . furosemide (LASIX) 20 MG tablet   Oral   Take 60 mg by mouth 2 (two) times  daily.         Marland Kitchen HYDROcodone-acetaminophen (NORCO) 10-325 MG per tablet   Oral   Take 1 tablet by mouth every 6 (six) hours as needed for pain. Do Not Administer Alprazolam and Hydrocodone together!!!         . isosorbide mononitrate (IMDUR) 60 MG 24 hr tablet   Oral   Take 1 tablet (60 mg total) by mouth daily.   30 tablet   3   . lactulose (CHRONULAC) 10 GM/15ML solution   Oral   Take 30 g by mouth 2 (two) times daily as needed. constipation         . levothyroxine (SYNTHROID, LEVOTHROID) 75 MCG tablet   Oral   Take 75 mcg by mouth daily.         . nitroGLYCERIN (NITROSTAT) 0.4 MG SL tablet   Sublingual   Place 0.4 mg under the tongue every 5 (five) minutes as needed. For chest pain         . omeprazole (PRILOSEC) 40 MG capsule   Oral   Take 1 capsule (40 mg total) by mouth daily.   30 capsule   11   . senna-docusate (SENOKOT-S) 8.6-50 MG per tablet   Oral   Take 1 tablet by mouth 2 (two) times daily.         . Tamsulosin HCl (FLOMAX) 0.4 MG CAPS   Oral   Take 1 capsule (0.4 mg total) by mouth daily.   30 capsule   0     Follow up with your primary care doctor/urologist  ...   . Vitamin D, Ergocalciferol, (DRISDOL) 50000 UNITS CAPS   Oral   Take 50,000 Units by mouth every 7 (seven) days. Takes on Wed           Triage Vitals: BP 161/73  Pulse 70  Temp(Src) 97.5 F (36.4 C) (Oral)  Resp 26  SpO2 97%  Physical Exam  Nursing note and vitals reviewed. Constitutional: She is oriented to person, place, and time. She appears well-developed and well-nourished. No distress.  Appears uncomfortable   HENT:  Head: Normocephalic and atraumatic.  Eyes: Conjunctivae and EOM are normal.  Neck: Neck supple. No tracheal deviation present.  Cardiovascular: Normal rate and regular rhythm.   Femoral pulses are strong and equal    Pulmonary/Chest: Effort normal and breath sounds normal. No respiratory distress. She exhibits tenderness (moderate tenderness to left lower rib cage and mid clavicular line).  Abdominal: She exhibits no mass. There is tenderness (moderate epigastric tenderness). There is no rebound and no guarding.  Decreased bowel sounds  Musculoskeletal: Normal range of motion. She exhibits no edema.  Neurological: She is alert and oriented to person, place, and time.  Skin: Skin is warm and dry.  Psychiatric: She has a normal mood and affect. Her behavior is normal.    ED Course  Procedures (including critical care time)  Medications  morphine 4 MG/ML injection 4 mg (not administered)  metoCLOPramide (REGLAN) injection 10 mg (not administered)  diphenhydrAMINE (BENADRYL) injection 25 mg (not administered)    DIAGNOSTIC STUDIES: Oxygen Saturation is 97% on room air, normal by my interpretation.    COORDINATION OF CARE: 5:29 PM-Discussed treatment plan which includes pain medication, antiemetic, CXR, CBC panel, BMP and troponin with pt at bedside and pt agreed to plan.   Labs Reviewed  CBC WITH DIFFERENTIAL - Abnormal; Notable for the following:    RBC 3.75 (*)    Hemoglobin 10.9 (*)  HCT 33.0 (*)    RDW 18.5 (*)    All other components within normal limits  BASIC METABOLIC PANEL - Abnormal; Notable for the following:    Sodium 133 (*)    Chloride 94 (*)    Glucose, Bld 180 (*)    BUN 25 (*)    Creatinine, Ser 1.47 (*)    GFR calc non Af Amer 30 (*)    GFR calc Af Amer 35 (*)    All other components within normal limits  LACTIC ACID, PLASMA - Abnormal; Notable for the following:    Lactic Acid, Venous 2.6 (*)    All other components within normal limits  HEPATIC FUNCTION PANEL - Abnormal; Notable for the following:    AST 70 (*)    Alkaline Phosphatase 163 (*)    Indirect Bilirubin 0.1 (*)    All other components within normal limits  TROPONIN I  PROTIME-INR  APTT   Dg Chest  Portable 1 View  01/10/2013   *RADIOLOGY REPORT*  Clinical Data: Chest pain.  PORTABLE CHEST - 1 VIEW  Comparison: 11/23/2012  Findings: Prior CABG.  Cardiomegaly.  No acute airspace opacities or effusions.  No acute bony abnormality.  Degenerative changes in the thoracic spine.  Left pacer remains in place, unchanged.  IMPRESSION: Cardiomegaly.  No acute findings.   Original Report Authenticated By: Charlett Nose, M.D.    Date: 01/10/2013  Rate: 71  Rhythm: AV dual paced rhythm  QRS Axis: left  Intervals: normal  ST/T Wave abnormalities: normal  Conduction Disutrbances:none  Narrative Interpretation: Afebrile paced rhythm. When compared with ECG of 11/25/2012, no significant changes are seen  Old EKG Reviewed: unchanged    1. Chest pain   2. Renal insufficiency   3. Nausea       MDM  Chest pain very worrisome for cardiac cause. Because of AV sequential pacemaker, ECG cannot give any diagnostic information. Patient looks to be in significant distress. I am also concerned about possibility of dissecting aneurysm. Distal pulses appear strong. Her records are reviewed and she has had CT and ultrasound of the abdomen showing no evidence of aneurysm making dissecting aneurysm very unlikely. In addition, with patient's DO NOT RESUSCITATE status, there is no intervention that would be done for a dissecting aneurysm. She is given morphine for pain. She did not get any relief from ondansetron for nausea so she is given metoclopramide.  She got good relief from above-noted medications. She's feeling much better and looks much better at this point. Laboratory workup is unremarkable. Renal insufficiency is stable. Anemia is stable. The patient AST and alkaline phosphatase may be significant and will need to be followed. Ultrasound in 2000 didn't show cholelithiasis, so it is possible that she is having biliary colic. Case is discussed with Dr. Orvan Falconer of [facility] who agrees to admit the  patient.  I personally performed the services described in this documentation, which was scribed in my presence. The recorded information has been reviewed and is accurate.          [name] Booze, MD 01/10/13 2007  [name] Booze, MD 01/10/13 2008

## 2013-01-10 NOTE — ED Notes (Signed)
Pt arrived by ems from [facility], she began having mid-sternal cp, that radiates to her back, has been given 3 sl ntg, morphine, zofran and aspirin pta.  +nausea and sweating, +sob, h/o open heart surgery.

## 2013-01-10 NOTE — H&P (Signed)
[facility] History and Physical  [name] [name]  RUE:[id number]  DOB: 06/25/1921   DOA: 01/10/2013   PCP:   Terald Sleeper, MD   Chief Complaint:  Chest pain today  HPI: [name] [name] is an 79 y.o. female.  Elderly resident of the [facility] skilled nursing facility, a retired Engineer, civil (consulting), presents with a smothering chest pain and she rates it at worst greater than 10 out of 10 radiating through to the back associated with nausea and diaphoresis. She was transported to the emergency room and got some relief from Zofran and Reglan but felt mostly relief came from intravenous morphine was not sure that she was helped by nitroglycerin   She status post pacemaker for tachybradycardia syndrome, and has systolic heart failure due to ischemic cardiomyopathy Pain occurs at rest and she denies dyspnea on exertion; she was unable to identify any aggravating or relieving factors.  Rewiew of Systems:   All systems negative except as marked bold or noted in the HPI;  Constitutional:    malaise, fever and chills. ;  Eyes:   eye pain, redness and discharge. ;  ENMT:   ear pain, hoarseness, nasal congestion, sinus pressure and sore throat. ;  Cardiovascular:    chest pain, palpitations, diaphoresis, dyspnea and peripheral edema.  Respiratory:   cough, hemoptysis, wheezing and stridor. ;  Gastrointestinal:  nausea, vomiting, diarrhea, constipation, abdominal pain, melena, blood in stool, hematemesis, jaundice and rectal bleeding. unusual weight loss..   Genitourinary:    frequency, dysuria, incontinence,flank pain and hematuria; Musculoskeletal:   back pain and neck pain.  swelling and trauma.;  Skin: .  pruritus, rash, abrasions, bruising and skin lesion.; ulcerations Neuro:    headache, lightheadedness and neck stiffness.  weakness, altered level of consciousness, altered mental status, extremity weakness, burning feet, involuntary movement, seizure and syncope.  Psych:    anxiety,  depression, insomnia, tearfulness, panic attacks, hallucinations, paranoia, suicidal or homicidal ideation.   Past Medical History  Diagnosis Date  . Coronary artery disease     a. LAD stent 1999 after NSTEMI. b. CABG 2004. c. Last LHC 02/2010 - patent grafts.  . Hypertension   . Hyperlipidemia   . Carotid artery disease     status post right carotid endarterectomy.  . Degenerative joint disease   . Restless leg syndrome   . Chronic renal insufficiency   . Heparin induced thrombocytopenia   . Diphtheria     "as a child"  . Pleural effusion, bilateral 06/2003  . Pseudoaneurysm     status post repair following catherization in 1999  . Chronic systolic heart failure 10/02/11  . Ischemic cardiomyopathy     Echocardiogram 10/01/11: Mild LVH, EF 35-40%, basal inferior, basal to mid posterior and basal to mid anterolateral severe hypokinesis, mild to moderate AI, moderate MR (ischemic MR), moderate LAE, mild RVE, mildly reduced RV systolic function, mild RAE, PASP 43-44  . COPD (chronic obstructive pulmonary disease)   . Atrial fibrillation/flutter     s/p multiple DCCVs;  amiodarone started 09/2011, TEE DCCV 3/13;  amio and coumadin d/c'd after admxn to [facility] 02/2012 with frequent falls/syncope  . Tachycardia-bradycardia syndrome     s/p Medtronic pacemaker implant 09/2011 (8 sec pause noted)  . GERD (gastroesophageal reflux disease)   . Renal artery stenosis     bilateral- status post stenting  . Non-functioning kidney   . Depression     "cries alot"  . Gout   . Hypothyroidism   . Anemia  Past Surgical History  Procedure Laterality Date  . Carotid endarterectomy  05/2003    right  . Pacemaker insertion  10/02/11    implanted by Dr Johney Frame  . Cholecystectomy  ?02/2011  . Tonsillectomy and adenoidectomy    . Renal artery stent  06/2003    bilaterally/e-chart  . Thoracentesis  ? 2000; 05/2011  . Coronary angioplasty with stent placement  1999  . Av fistula repair  01/1998     w/pseudoaneurysm repair S/P catheterization/E-chart  . Coronary artery bypass graft  05/2003    CABG X 3  . Tee without cardioversion  10/23/2011    Procedure: TRANSESOPHAGEAL ECHOCARDIOGRAM (TEE);  Surgeon: Wendall Stade, MD;  Location: [facility] ENDOSCOPY;  Service: Cardiovascular;  Laterality: N/A;  . Cardioversion  10/23/2011    Procedure: CARDIOVERSION;  Surgeon: Wendall Stade, MD;  Location: [facility] ENDOSCOPY;  Service: Cardiovascular;  Laterality: N/A;    Medications:  HOME MEDS: Prior to Admission medications   Medication Sig Start Date End Date Taking? Authorizing Provider  ALPRAZolam (XANAX) 0.25 MG tablet Take 0.25 mg by mouth at bedtime.   Yes Historical Provider, MD  amiodarone (PACERONE) 200 MG tablet Take 100 mg by mouth at bedtime.   Yes Historical Provider, MD  aspirin EC 81 MG tablet Take 1 tablet (81 mg total) by mouth daily. 03/07/12  Yes Beatrice Lecher, PA-C  atorvastatin (LIPITOR) 10 MG tablet Take 1 tablet (10 mg total) by mouth daily at 6 PM. 09/05/12  Yes Rhonda G Barrett, PA-C  carvedilol (COREG) 12.5 MG tablet Take 1 tablet (12.5 mg total) by mouth 2 (two) times daily with a meal. 01/17/12 01/16/13 Yes Hillis Range, MD  cholecalciferol (VITAMIN D) 1000 UNITS tablet Take 1,000 Units by mouth daily.   Yes Historical Provider, MD  docusate sodium (COLACE) 100 MG capsule Take 2 capsules (200 mg total) by mouth 2 (two) times daily. 08/29/12  Yes Dayna N Dunn, PA-C  furosemide (LASIX) 20 MG tablet Take 60 mg by mouth 2 (two) times daily.   Yes Historical Provider, MD  isosorbide mononitrate (IMDUR) 60 MG 24 hr tablet Take 1 tablet (60 mg total) by mouth daily. 11/27/12  Yes Rhonda G Barrett, PA-C  levothyroxine (SYNTHROID, LEVOTHROID) 75 MCG tablet Take 75 mcg by mouth daily.   Yes Historical Provider, MD  omeprazole (PRILOSEC) 40 MG capsule Take 1 capsule (40 mg total) by mouth daily. 11/27/12  Yes Rhonda G Barrett, PA-C  senna-docusate (SENOKOT-S) 8.6-50 MG per tablet Take 1 tablet by  mouth 2 (two) times daily. 09/05/12  Yes Rhonda G Barrett, PA-C  Tamsulosin HCl (FLOMAX) 0.4 MG CAPS Take 1 capsule (0.4 mg total) by mouth daily. 08/29/12  Yes Dayna N Dunn, PA-C  nitroGLYCERIN (NITROSTAT) 0.4 MG SL tablet Place 0.4 mg under the tongue every 5 (five) minutes as needed. For chest pain    Historical Provider, MD     Allergies:  Allergies  Allergen Reactions  . Altace (Ramipril) Hives    This is not a listed allergy on MAR provided by the [facility]  . Contrast Media (Iodinated Diagnostic Agents) Other (See Comments)    Reaction noted by md-unknown reaction per family  . Heparin Other (See Comments)    Clots blood instead of thinning  . Hydromorphone Hcl Other (See Comments)    Crazy feeling  . Plavix (Clopidogrel Bisulfate)     Social History:   reports that she has never smoked. She has never used smokeless tobacco. She reports that  she does not drink alcohol or use illicit drugs.  Family History: Family History  Problem Relation Age of Onset  . Cancer Mother 33    died  . Lung disease Father 72    died  . Heart disease Sister     2 sisters and her son  . Anesthesia problems Neg Hx      Physical Exam: Filed Vitals:   01/10/13 1727 01/10/13 1746 01/10/13 1917 01/10/13 2000  BP: 161/73 161/73 131/62 125/60  Pulse: 70 73 70 69  Temp: 97.5 F (36.4 C)     TempSrc: Oral     Resp: 26 21 14 14   SpO2: 97% 100% 99% 99%   Blood pressure 125/60, pulse 69, temperature 97.5 F (36.4 C), temperature source Oral, resp. rate 14, SpO2 99.00%.  GEN:  Pleasant  elderly Caucasian lady lying in bed  lying bed in no acute distress; cooperative with exam PSYCH:  alert and oriented x4;  anxious nor depressed; affect is appropriate. HEENT: Mucous membranes pink and anicteric; PERRLA; EOM intact; no cervical lymphadenopathy nor thyromegaly or carotid bruit; no JVD; Breasts:: Not examined CHEST WALL: No tenderness; pacemaker left upper chest CHEST: Normal respiration,  clear to auscultation bilaterally HEART: Regular rate and rhythm; no murmurs rubs or gallops BACK: kyphosis no scoliosis; no CVA tenderness ABDOMEN: Obese, soft non-tender; no masses, no organomegaly, normal abdominal bowel sounds;  no intertriginous candida. Rectal Exam: Not done EXTREMITIES: ; age-appropriate arthropathy of the hands and knees; no edema; no ulcerations. Genitalia: not examined PULSES: 2+ and symmetric SKIN: Normal hydration no rash or ulceration CNS: Cranial nerves 2-12 grossly intact no focal lateralizing neurologic deficit   Labs on Admission:  Basic Metabolic Panel:  Recent Labs Lab 01/10/13 1800  NA 133*  K 3.7  CL 94*  CO2 27  GLUCOSE 180*  BUN 25*  CREATININE 1.47*  CALCIUM 9.3   Liver Function Tests:  Recent Labs Lab 01/10/13 1800  AST 70*  ALT 28  ALKPHOS 163*  BILITOT 0.3  PROT 7.0  ALBUMIN 3.5   No results found for this basename: LIPASE, AMYLASE,  in the last 168 hours No results found for this basename: AMMONIA,  in the last 168 hours CBC:  Recent Labs Lab 01/10/13 1800  WBC 5.3  NEUTROABS 4.0  HGB 10.9*  HCT 33.0*  MCV 88.0  PLT 170   Cardiac Enzymes:  Recent Labs Lab 01/10/13 1800  TROPONINI <0.30   BNP: No components found with this basename: POCBNP,  D-dimer: No components found with this basename: D-DIMER,  CBG: No results found for this basename: GLUCAP,  in the last 168 hours  Radiological Exams on Admission: Dg Chest Portable 1 View  01/10/2013   *RADIOLOGY REPORT*  Clinical Data: Chest pain.  PORTABLE CHEST - 1 VIEW  Comparison: 11/23/2012  Findings: Prior CABG.  Cardiomegaly.  No acute airspace opacities or effusions.  No acute bony abnormality.  Degenerative changes in the thoracic spine.  Left pacer remains in place, unchanged.  IMPRESSION: Cardiomegaly.  No acute findings.   Original Report Authenticated By: Charlett Nose, M.D.    EKG: Independently reviewed. AV pacing; no change since EKG of  2013   Assessment/Plan   .Principal Problem:   Chest pain Active Problems:   DM   HYPERLIPIDEMIA   HYPERTENSION   RENAL ARTERY STENOSIS   DEGENERATIVE JOINT DISEASE   Tachycardia-bradycardia syndrome   UTI (lower urinary tract infection)   Hypothyroidism   CKD (chronic kidney disease), stage III  PLAN:  Bring this lady on observation for cardiac rule out; if there is no evidence of acute coronary syndrome while in hospital we'll discharge back to the nursing home tomorrow for cardiology followup. Ensure PUD coverage  Other plans as per orders.  Code Status:  DNR Family Communication:  Plans discussed with patient at bedside Disposition Plan: likely home within 24 hours if condition does not worsen    Andora Krull Nocturnist [facility] Pager [phone]   01/10/2013, 9:13 PM

## 2013-01-11 ENCOUNTER — Inpatient Hospital Stay
Admission: RE | Admit: 2013-01-11 | Discharge: 2013-08-14 | Disposition: A | Discharge status: Home / Self Care | Payer: Medicare (Managed Care) | Source: Ambulatory Visit | Attending: Internal Medicine | Admitting: Internal Medicine

## 2013-01-11 DIAGNOSIS — I509 Heart failure, unspecified: Secondary | ICD-10-CM

## 2013-01-11 DIAGNOSIS — I5022 Chronic systolic (congestive) heart failure: Secondary | ICD-10-CM

## 2013-01-11 LAB — CBC
Hemoglobin: 11 g/dL — ABNORMAL LOW (ref 12.0–15.0)
MCH: 28.9 pg (ref 26.0–34.0)
MCHC: 32.4 g/dL (ref 30.0–36.0)
Platelets: 148 10*3/uL — ABNORMAL LOW (ref 150–400)
RBC: 3.8 MIL/uL — ABNORMAL LOW (ref 3.87–5.11)

## 2013-01-11 LAB — URINALYSIS, ROUTINE W REFLEX MICROSCOPIC
Ketones, ur: NEGATIVE mg/dL
Leukocytes, UA: NEGATIVE
Nitrite: NEGATIVE
Specific Gravity, Urine: 1.01 (ref 1.005–1.030)
pH: 5.5 (ref 5.0–8.0)

## 2013-01-11 LAB — LIPID PANEL
Cholesterol: 141 mg/dL (ref 0–200)
Triglycerides: 91 mg/dL (ref <150)
VLDL: 18 mg/dL (ref 0–40)

## 2013-01-11 LAB — COMPREHENSIVE METABOLIC PANEL
ALT: 46 U/L — ABNORMAL HIGH (ref 0–35)
AST: 73 U/L — ABNORMAL HIGH (ref 0–37)
Alkaline Phosphatase: 141 U/L — ABNORMAL HIGH (ref 39–117)
CO2: 27 mEq/L (ref 19–32)
Calcium: 9.1 mg/dL (ref 8.4–10.5)
Potassium: 3.7 mEq/L (ref 3.5–5.1)
Sodium: 134 mEq/L — ABNORMAL LOW (ref 135–145)
Total Protein: 6.9 g/dL (ref 6.0–8.3)

## 2013-01-11 LAB — HEMOGLOBIN A1C
Hgb A1c MFr Bld: 6 % — ABNORMAL HIGH (ref <5.7)
Mean Plasma Glucose: 126 mg/dL — ABNORMAL HIGH (ref <117)

## 2013-01-11 LAB — TSH: TSH: 7.982 u[IU]/mL — ABNORMAL HIGH (ref 0.350–4.500)

## 2013-01-11 MED ORDER — HYDROCODONE-ACETAMINOPHEN 10-325 MG PO TABS
1.0000 | ORAL_TABLET | Freq: Four times a day (QID) | ORAL | Status: DC | PRN
  Administered 2013-01-11: 1 via ORAL
  Filled 2013-01-11: qty 1

## 2013-01-11 NOTE — Progress Notes (Signed)
Patient id being discharged back to [facility] report call to nurse at [facility] Patient left floor via wheelchair accompanied by staff  No c/o pain at d/c Discharge instructions/packet sent with patient Thomasene Dubow, Kae Heller

## 2013-01-11 NOTE — Discharge Summary (Signed)
Physician Discharge Summary  [name] WILLETTE WGN:[id number] DOB: 06/25/1921 DOA: 01/10/2013  PCP: Terald Sleeper, MD  Admit date: 01/10/2013 Discharge date: 01/11/2013  Time spent: 35 minutes  Recommendations for Outpatient Follow-up:  1. Patient will need followup with her primary cardiologist Dr. Antoine Poche in 1-2 weeks.  Discharge Diagnoses:  Principal Problem:   Chest pain Active Problems:   DM   HYPERLIPIDEMIA   HYPERTENSION   RENAL ARTERY STENOSIS   DEGENERATIVE JOINT DISEASE   Tachycardia-bradycardia syndrome   Hypothyroidism   CKD (chronic kidney disease), stage III   Discharge Condition: Improved  Diet recommendation: Low-salt, low carb  Filed Weights   01/11/13 0446  Weight: 55.2 kg (121 lb 11.1 oz)    History of present illness:  [name] [name] is an 79 y.o. female. Elderly resident of the [facility] skilled nursing facility, a retired Engineer, civil (consulting), presents with a smothering chest pain and she rates it at worst greater than 10 out of 10 radiating through to the back associated with nausea and diaphoresis. She was transported to the emergency room and got some relief from Zofran and Reglan but felt mostly relief came from intravenous morphine was not sure that she was helped by nitroglycerin  She status post pacemaker for tachybradycardia syndrome, and has systolic heart failure due to ischemic cardiomyopathy  Pain occurs at rest and she denies dyspnea on exertion; she was unable to identify any aggravating or relieving factors.   Hospital Course:  This lady was admitted from the nursing home for chest pain. The patient has known coronary disease and multiple risk factors. She was admitted to the hospital for overnight observation. She ruled out for acute myocardial infarction with negative cardiac markers and no acute changes on EKG. She reports significant improvement in her symptoms since admission. She has been receiving narcotic pain medicine. It is possible that  her pain is musculoskeletal in origin. In any case, with her complex cardiac history, she is recommended to followup with her primary cardiologist in the next one to 2 weeks to see if any further workup is necessary. She was felt to be a poor candidate for aggressive intervention in the past.  Procedures:  None  Consultations:  None  Discharge Exam: Filed Vitals:   01/10/13 1917 01/10/13 2000 01/10/13 2135 01/11/13 0446  BP: 131/62 125/60 152/71 138/62  Pulse: 70 69 70 70  Temp:   98.1 F (36.7 C) 97.6 F (36.4 C)  TempSrc:   Oral Oral  Resp: 14 14 20 20   Height:    5\' 4"  (1.626 m)  Weight:    55.2 kg (121 lb 11.1 oz)  SpO2: 99% 99% 100% 99%    General: No acute distress Cardiovascular: S1, S2, regular rate and rhythm Respiratory: Clear to auscultation bilaterally  Discharge Instructions      Discharge Orders   Future Orders Complete By Expires     Call MD for:  difficulty breathing, headache or visual disturbances  As directed     Call MD for:  severe uncontrolled pain  As directed     Diet - low sodium heart healthy  As directed     Increase activity slowly  As directed         Medication List    TAKE these medications       ALPRAZolam 0.25 MG tablet  Commonly known as:  XANAX  Take 0.25 mg by mouth at bedtime.     amiodarone 200 MG tablet  Commonly known as:  PACERONE  Take 100 mg by mouth at bedtime.     aspirin EC 81 MG tablet  Take 1 tablet (81 mg total) by mouth daily.     atorvastatin 10 MG tablet  Commonly known as:  LIPITOR  Take 1 tablet (10 mg total) by mouth daily at 6 PM.     carvedilol 12.5 MG tablet  Commonly known as:  COREG  Take 1 tablet (12.5 mg total) by mouth 2 (two) times daily with a meal.     cholecalciferol 1000 UNITS tablet  Commonly known as:  VITAMIN D  Take 1,000 Units by mouth daily.     docusate sodium 100 MG capsule  Commonly known as:  COLACE  Take 2 capsules (200 mg total) by mouth 2 (two) times daily.      furosemide 20 MG tablet  Commonly known as:  LASIX  Take 60 mg by mouth 2 (two) times daily.     HYDROcodone-acetaminophen 10-325 MG per tablet  Commonly known as:  NORCO  Take 1 tablet by mouth every 6 (six) hours as needed for pain. Do not administer Xanax and Norco together.     isosorbide mononitrate 60 MG 24 hr tablet  Commonly known as:  IMDUR  Take 1 tablet (60 mg total) by mouth daily.     levothyroxine 75 MCG tablet  Commonly known as:  SYNTHROID, LEVOTHROID  Take 75 mcg by mouth daily.     nitroGLYCERIN 0.4 MG SL tablet  Commonly known as:  NITROSTAT  Place 0.4 mg under the tongue every 5 (five) minutes as needed. For chest pain     omeprazole 40 MG capsule  Commonly known as:  PRILOSEC  Take 1 capsule (40 mg total) by mouth daily.     senna-docusate 8.6-50 MG per tablet  Commonly known as:  Senokot-S  Take 1 tablet by mouth 2 (two) times daily.     tamsulosin 0.4 MG Caps  Commonly known as:  FLOMAX  Take 1 capsule (0.4 mg total) by mouth daily.       Allergies  Allergen Reactions  . Altace (Ramipril) Hives    This is not a listed allergy on MAR provided by the [facility]  . Contrast Media (Iodinated Diagnostic Agents) Other (See Comments)    Reaction noted by md-unknown reaction per family  . Heparin Other (See Comments)    Clots blood instead of thinning  . Hydromorphone Hcl Other (See Comments)    Crazy feeling  . Plavix (Clopidogrel Bisulfate)       The results of significant diagnostics from this hospitalization (including imaging, microbiology, ancillary and laboratory) are listed below for reference.    Significant Diagnostic Studies: Dg Chest Portable 1 View  01/10/2013   *RADIOLOGY REPORT*  Clinical Data: Chest pain.  PORTABLE CHEST - 1 VIEW  Comparison: 11/23/2012  Findings: Prior CABG.  Cardiomegaly.  No acute airspace opacities or effusions.  No acute bony abnormality.  Degenerative changes in the thoracic spine.  Left pacer remains in  place, unchanged.  IMPRESSION: Cardiomegaly.  No acute findings.   Original Report Authenticated By: Charlett Nose, M.D.    Microbiology: No results found for this or any previous visit (from the past 240 hour(s)).   Labs: Basic Metabolic Panel:  Recent Labs Lab 01/10/13 1800 01/10/13 2244 01/11/13 0534  NA 133*  --  134*  K 3.7  --  3.7  CL 94*  --  95*  CO2 27  --  27  GLUCOSE  180*  --  121*  BUN 25*  --  25*  CREATININE 1.47*  --  1.51*  CALCIUM 9.3  --  9.1  MG  --  2.2  --    Liver Function Tests:  Recent Labs Lab 01/10/13 1800 01/11/13 0534  AST 70* 73*  ALT 28 46*  ALKPHOS 163* 141*  BILITOT 0.3 0.3  PROT 7.0 6.9  ALBUMIN 3.5 3.5    Recent Labs Lab 01/10/13 1800  LIPASE 34   No results found for this basename: AMMONIA,  in the last 168 hours CBC:  Recent Labs Lab 01/10/13 1800 01/11/13 0534  WBC 5.3 7.4  NEUTROABS 4.0  --   HGB 10.9* 11.0*  HCT 33.0* 33.9*  MCV 88.0 89.2  PLT 170 148*   Cardiac Enzymes:  Recent Labs Lab 01/10/13 1800 01/10/13 2244 01/11/13 0534 01/11/13 1100  TROPONINI <0.30 <0.30 <0.30 <0.30   BNP: BNP (last 3 results)  Recent Labs  08/30/12 1323 09/04/12 0610 11/23/12 1516  PROBNP 19433.0* 7008.0* 6161.0*   CBG: No results found for this basename: GLUCAP,  in the last 168 hours     Signed:  MEMON,JEHANZEB  [facility] 01/11/2013, 4:22 PM

## 2013-01-12 ENCOUNTER — Non-Acute Institutional Stay (SKILLED_NURSING_FACILITY): Payer: PRIVATE HEALTH INSURANCE | Admitting: Internal Medicine

## 2013-01-12 DIAGNOSIS — I248 Other forms of acute ischemic heart disease: Secondary | ICD-10-CM

## 2013-01-12 DIAGNOSIS — I509 Heart failure, unspecified: Secondary | ICD-10-CM

## 2013-01-12 NOTE — Progress Notes (Signed)
Patient ID: [name] [name], female   DOB: 06/25/1921, 79 y.o.   MRN: [id number] Facility; [facility]. Chief complaint; chest pain.  History; this is an she has a history of heart failure and was hospitalized in January x2. She has a known EF of 30-35% with an ischemic cardiomyopathy and a past history of a CABG. She has a pacemaker. Also listed as chronic renal failure. As I understand things since she complained of indigestion yesterday and received something for this without relief, she was then given 2 nitroglycerin without relief and sent to the ER. There are her cardiac enzymes were negative, EKG, showed a paced rhythm. I am really not able to get a better description of this from the patient.  Review of systems Respiratory does not complain of exertional shortness of breath. Cardiac chest pain, which was nonexertional. Yesterday, I am unable to get an exertional component to this. GI no dysphagia, nausea, or vomiting.  Physical exam weight is stable at 117 pounds, although this is up from her admission, it has been stable since May.8. Temperature 90.6, pulse 70, respirations 18, blood pressure 133/46. O2 sats 93% on room air. Respiratory completely clear. Air entry bilaterally. Cardiac heart sounds are normal. No S4. No S3 soft, blowing midsystolic murmur that does not radiate and sounds benign. Her jugular venous pressure is not elevated.  Abdomen no liver no spleen no tenderness. Extremities no edema  Impressions/plan Question angina; I am going to check her lab work and review this with her in 2 days. Previously she has been noted to be medical management only CHF. This seems well compensated.  I am uncertain whether she actually requires oxygen. All review this again on Wednesday

## 2013-01-23 ENCOUNTER — Other Ambulatory Visit: Payer: Self-pay | Admitting: *Deleted

## 2013-01-23 MED ORDER — HYDROCODONE-ACETAMINOPHEN 10-325 MG PO TABS: 1.0000 | ORAL_TABLET | Freq: Four times a day (QID) | ORAL | Status: DC | PRN

## 2013-02-04 ENCOUNTER — Encounter: Payer: Self-pay | Admitting: Cardiology

## 2013-02-04 ENCOUNTER — Ambulatory Visit (INDEPENDENT_AMBULATORY_CARE_PROVIDER_SITE_OTHER): Payer: Medicare (Managed Care) | Admitting: Cardiology

## 2013-02-04 VITALS — BP 114/63 | HR 77 | Ht 64.0 in | Wt 116.4 lb

## 2013-02-04 VITALS — BP 114/63 | HR 70 | Wt 116.4 lb

## 2013-02-04 DIAGNOSIS — I5023 Acute on chronic systolic (congestive) heart failure: Secondary | ICD-10-CM

## 2013-02-04 DIAGNOSIS — I509 Heart failure, unspecified: Secondary | ICD-10-CM

## 2013-02-04 NOTE — Progress Notes (Signed)
HPI The patient for followup of her hospitalization earlier this year for chest pain. This was in April. We took care of her at that time. She was back in for overnight observation and may with chest pain which was thought to be atypical. Previously she had been managed for her cardiomyopathy and volume overload with earlier admissions requiring inotropic therapy.  She is living in a nursing home now and his waiting to move to independent living. She actually thinks that she is feeling really well.  The patient denies any new symptoms such as chest discomfort, neck or arm discomfort. There has been no new shortness of breath, PND or orthopnea. There have been no reported palpitations, presyncope or syncope.   Allergies  Allergen Reactions  . Altace (Ramipril) Hives    This is not a listed allergy on MAR provided by the [facility]  . Contrast Media (Iodinated Diagnostic Agents) Other (See Comments)    Reaction noted by md-unknown reaction per family  . Heparin Other (See Comments)    Clots blood instead of thinning  . Hydromorphone Hcl Other (See Comments)    Crazy feeling  . Plavix (Clopidogrel Bisulfate)     Prior to Admission medications   Medication Sig Start Date End Date Taking? Authorizing Provider  ALPRAZolam (XANAX) 0.25 MG tablet Take 0.25 mg by mouth at bedtime.   Yes Historical Provider, MD  amiodarone (PACERONE) 200 MG tablet Take 100 mg by mouth at bedtime.   Yes Historical Provider, MD  aspirin EC 81 MG tablet Take 1 tablet (81 mg total) by mouth daily. 03/07/12  Yes Beatrice Lecher, PA-C  atorvastatin (LIPITOR) 10 MG tablet Take 1 tablet (10 mg total) by mouth daily at 6 PM. 09/05/12  Yes Rhonda G Barrett, PA-C  carvedilol (COREG) 12.5 MG tablet Take 1 tablet (12.5 mg total) by mouth 2 (two) times daily with a meal. 01/17/12 02/04/13 Yes Hillis Range, MD  cholecalciferol (VITAMIN D) 1000 UNITS tablet Take 1,000 Units by mouth daily.   Yes Historical Provider, MD  docusate  sodium (COLACE) 100 MG capsule Take 2 capsules (200 mg total) by mouth 2 (two) times daily. 08/29/12  Yes Dayna N Dunn, PA-C  furosemide (LASIX) 20 MG tablet Take 60 mg by mouth 2 (two) times daily.   Yes Historical Provider, MD  HYDROcodone-acetaminophen (NORCO) 10-325 MG per tablet Take 1 tablet by mouth every 6 (six) hours as needed for pain. Do not administer Xanax and Norco together.   Yes Historical Provider, MD  isosorbide mononitrate (IMDUR) 60 MG 24 hr tablet Take 1 tablet (60 mg total) by mouth daily. 11/27/12  Yes Rhonda G Barrett, PA-C  levothyroxine (SYNTHROID, LEVOTHROID) 75 MCG tablet Take 75 mcg by mouth daily.   Yes Historical Provider, MD  nitroGLYCERIN (NITROSTAT) 0.4 MG SL tablet Place 0.4 mg under the tongue every 5 (five) minutes as needed. For chest pain   Yes Historical Provider, MD  omeprazole (PRILOSEC) 40 MG capsule Take 1 capsule (40 mg total) by mouth daily. 11/27/12  Yes Rhonda G Barrett, PA-C  senna-docusate (SENOKOT-S) 8.6-50 MG per tablet Take 1 tablet by mouth 2 (two) times daily. 09/05/12  Yes Rhonda G Barrett, PA-C  Tamsulosin HCl (FLOMAX) 0.4 MG CAPS Take 1 capsule (0.4 mg total) by mouth daily. 08/29/12  Yes Laurann Montana, PA-C     Past Medical History  Diagnosis Date  . Coronary artery disease     a. LAD stent 1999 after NSTEMI. b. CABG 2004.  c. Last LHC 02/2010 - patent grafts.  . Hypertension   . Hyperlipidemia   . Carotid artery disease     status post right carotid endarterectomy.  . Degenerative joint disease   . Restless leg syndrome   . Chronic renal insufficiency   . Heparin induced thrombocytopenia   . Diphtheria     "as a child"  . Pleural effusion, bilateral 06/2003  . Pseudoaneurysm     status post repair following catherization in 1999  . Chronic systolic heart failure 10/02/11  . Ischemic cardiomyopathy     Echocardiogram 10/01/11: Mild LVH, EF 35-40%, basal inferior, basal to mid posterior and basal to mid anterolateral severe hypokinesis,  mild to moderate AI, moderate MR (ischemic MR), moderate LAE, mild RVE, mildly reduced RV systolic function, mild RAE, PASP 43-44  . COPD (chronic obstructive pulmonary disease)   . Atrial fibrillation/flutter     s/p multiple DCCVs;  amiodarone started 09/2011, TEE DCCV 3/13;  amio and coumadin d/c'd after admxn to [facility] 02/2012 with frequent falls/syncope  . Tachycardia-bradycardia syndrome     s/p [employer] pacemaker implant 09/2011 (8 sec pause noted)  . GERD (gastroesophageal reflux disease)   . Renal artery stenosis     bilateral- status post stenting  . Non-functioning kidney   . Depression     "cries alot"  . Gout   . Hypothyroidism   . Anemia     Past Surgical History  Procedure Laterality Date  . Carotid endarterectomy  05/2003    right  . Pacemaker insertion  10/02/11    implanted by Dr Johney Frame  . Cholecystectomy  ?02/2011  . Tonsillectomy and adenoidectomy    . Renal artery stent  06/2003    bilaterally/e-chart  . Thoracentesis  ? 2000; 05/2011  . Coronary angioplasty with stent placement  1999  . Av fistula repair  01/1998    w/pseudoaneurysm repair S/P catheterization/E-chart  . Coronary artery bypass graft  05/2003    CABG X 3  . Tee without cardioversion  10/23/2011    Procedure: TRANSESOPHAGEAL ECHOCARDIOGRAM (TEE);  Surgeon: Wendall Stade, MD;  Location: [facility] ENDOSCOPY;  Service: Cardiovascular;  Laterality: N/A;  . Cardioversion  10/23/2011    Procedure: CARDIOVERSION;  Surgeon: Wendall Stade, MD;  Location: [facility] ENDOSCOPY;  Service: Cardiovascular;  Laterality: N/A;    ROS:  Otherwise as stated in the HPI and negative for all other systems.  PHYSICAL EXAM BP 114/63  Pulse 70  Wt 116 lb 6.4 oz (52.799 kg)  BMI 19.97 kg/m2 GENERAL:  Well appearing HEENT:  Pupils equal round and reactive, fundi not visualized, oral mucosa unremarkable NECK:  No jugular venous distention, waveform within normal limits, carotid upstroke brisk and symmetric, no bruits, no  thyromegaly LYMPHATICS:  No cervical, inguinal adenopathy LUNGS:  Clear to auscultation bilaterally BACK:  No CVA tenderness CHEST:  Unremarkable HEART:  PMI not displaced or sustained,S1 and S2 within normal limits, no S3, no S4, no clicks, no rubs, no murmurs ABD:  Flat, positive bowel sounds normal in frequency in pitch, no bruits, no rebound, no guarding, no midline pulsatile mass, no hepatomegaly, no splenomegaly EXT:  2 plus pulses throughout, no edema, no cyanosis no clubbing SKIN:  No rashes no nodules NEURO:  Cranial nerves II through XII grossly intact, motor grossly intact throughout PSYCH:  Cognitively intact, oriented to person place and time  EKG: Atrioventricular paced rhythm rate 77.  02/04/2013 Day of a ASSESSMENT AND PLAN  Failure to thrive - She  is doing much better since her hospitalizations earlier this year. She really enjoys living at the [facility] and is anxious to get to independent living.  Cardiomyopathy - She seems to be euvolemic. She will continue the meds as listed. I reviewed her most recent labs and both recent hospitalizations.  RENAL INSUFFICIENCY -  This was stable recently in the hospital. No change in therapy is indicated. I  Atrial fibrillation with RVR -  She seems to be maintaining sinus rhythm. She will continue the medications as listed I will make sure she has appropriate amiodarone followup.  She has not been a Coumadin candidate since she had frequent falls previously.  CAD -  The patient has no new sypmtoms. We have chosen to manage this medically. Of note I added Plavix to her allergy list that she had a rash with this during the last hospitalization.

## 2013-02-04 NOTE — Patient Instructions (Addendum)
The current medical regimen is effective;  continue present plan and medications.  Follow up in 6 months with Dr Hochrein.  You will receive a letter in the mail 2 months before you are due.  Please call us when you receive this letter to schedule your follow up appointment.  

## 2013-02-06 NOTE — Progress Notes (Signed)
Error

## 2013-02-19 ENCOUNTER — Encounter: Payer: Self-pay | Admitting: Internal Medicine

## 2013-02-19 ENCOUNTER — Non-Acute Institutional Stay (SKILLED_NURSING_FACILITY): Payer: PRIVATE HEALTH INSURANCE | Admitting: Internal Medicine

## 2013-02-19 DIAGNOSIS — D649 Anemia, unspecified: Secondary | ICD-10-CM

## 2013-02-19 DIAGNOSIS — I495 Sick sinus syndrome: Secondary | ICD-10-CM

## 2013-02-19 DIAGNOSIS — K5900 Constipation, unspecified: Secondary | ICD-10-CM

## 2013-02-19 DIAGNOSIS — N183 Chronic kidney disease, stage 3 unspecified: Secondary | ICD-10-CM

## 2013-02-19 DIAGNOSIS — M25512 Pain in left shoulder: Secondary | ICD-10-CM

## 2013-02-19 DIAGNOSIS — I429 Cardiomyopathy, unspecified: Secondary | ICD-10-CM

## 2013-02-19 DIAGNOSIS — E039 Hypothyroidism, unspecified: Secondary | ICD-10-CM

## 2013-02-19 DIAGNOSIS — D518 Other vitamin B12 deficiency anemias: Secondary | ICD-10-CM

## 2013-02-19 DIAGNOSIS — D519 Vitamin B12 deficiency anemia, unspecified: Secondary | ICD-10-CM

## 2013-02-19 DIAGNOSIS — I5022 Chronic systolic (congestive) heart failure: Secondary | ICD-10-CM

## 2013-02-19 DIAGNOSIS — I509 Heart failure, unspecified: Secondary | ICD-10-CM

## 2013-02-19 DIAGNOSIS — R269 Unspecified abnormalities of gait and mobility: Secondary | ICD-10-CM

## 2013-02-19 DIAGNOSIS — E785 Hyperlipidemia, unspecified: Secondary | ICD-10-CM

## 2013-02-19 DIAGNOSIS — N289 Disorder of kidney and ureter, unspecified: Secondary | ICD-10-CM

## 2013-02-19 NOTE — Progress Notes (Signed)
Patient ID: [name] [name], female   DOB: [birth year], 79 y.o.   MRN: [id number]  This is a routine visit.  Level of care skilled.  Facility [facility]   chief complaint-medical management of CHF chronic renal failure anemia hypothyroidism chronic left shoulder pain with history of rotator cuff tear gait abnormality.  History of present illness.  Patient is a very pleasant elderly resident with the above diagnoses she has done very well here.  She [place] hospitalized for CHF and came back to the hospital with increased hypoxia.  She was started on IV Lasix this was transitioned to by mouth also had renal issues with a creatinine that rose above 2 her Lasix was reduced and creatinine did come down to about 1.5.  She continues on Lasix 60 mg twice a day.  She also had a history of multiple falls before hospitalization however this has not really been an issue here to my knowledge-she seems to have adjusted well.  Her weight at this time appears to be relatively stable around 110 pounds.  She does have a history of a chronic left shoulder rotator cuff tear-she has been asking for her Norco regularly I suspect we will make this routine twice a day  hopefully some more consistent relief  Today she has no acute complaints her vital signs continued to be stable.  Family medical social history has been reviewed per history and physical on 09/08/2012.  Medications have been reviewed per MAR.  Review of systems.  In general she denies any fever chills or weight appears to be relatively stable.  Skin-denies issues or rash or itching.  Head ears eyes nose mouth and throat-does not complaining of any visual changes sore throat or nasal discharge.  Respiratory-no complaints of shortness of breath or cough.  Cardiac-no complaints of chest pain history CHF which has been stable during her stay here.  GI-does not complaining of any nausea vomiting diarrhea or constipation.  GU-denies any  dysuria.  Muscle skeletal other than left shoulder pain has no complaints and she is actually not complaining about today although nursing staff says she does ask for her Norco quite frequently.  Neurologic no complaints of headache or dizziness.  Psych-appears to be in good spirits does not complaining of depression or anxiety--although she does have a history of anxiety and is on Xanax which apparently is working quite well.  Physical exam.  Temperature is 98.5 pulse 70 respirations 20 blood pressure 124/57 O2 saturation is in the 90s.  In general this is a very pleasant somewhat frail elderly female in no distress.  Her skin is warm and dry.   Eyes-pupils appear equal round reactive to light sclera and conjunctiva are clear.  Oropharynx is clear mucous membranes are moist.  Chest is clear to auscultation without any rhonchi rales or wheezes.  No labored breathing.  Heart-does have a 3/6 systolic murmur At his baseline I do not really see any significant lower extremity edema.--- It is regular rate and rhythm   abdomen-soft nontender with positive bowel sounds.  Muscle skeletal moves all extremities x4 at baseline she does have limited range of motion of her left shoulder I do not see any deformity here essentially baseline exam.--She is ambulating much better than when she came in  Neurologic I do not see any focal deficit--her speech is clear-I do not see any lateralizing findings.  Labs.  01/12/2013.  T4-11 0.4-T3-57.9.  01/19/2013.  Sodium 137 potassium 3.6 BUN 24 creatinine 1.60.  01/14/2013.  WBC  6.8 hemoglobin 10.2 platelets 151.   11/17/2012.  Cholesterol 155 triglycerides 106 HDL 50 LDL 84.  [id number].  01/07/2013.  TSH 5.771.  Assessment and plan.  #1-CHF this appears to be quite stable and quite mild her weight continues to be stable no complaints of shortness of breath she does continue on Lasix continue to monitor--she is on a beta  blocker.  #2-history of chronic renal failure-Will update metabolic panel most recent lab appear to be at baseline Will have to watch this closely with her diuretics.  #3-history of tachybradycardia syndrome she is on the amiodarone as well as a beta blocker-this appears to be stable-she also continues on aspirin.---  is status post pacemaker implant  #4 hyperlipidemia-her recent lipid panel this appears to be stable she is on Lipitor will check liver function tests.  5-left shoulder pain with history rotator cuff tear-deemed not a surgical candidate presently-will change her Norco to 10/325 milligrams twice a day routine-- every 6 hours when necessary.  #6-hypothyroidism-TSH was mildly elevated back in May-will increase Synthroid slightly to 88 mcg a day and monitor--do note T4 was within normal limits T3 was low--- this is a borderline situation.  #7-history of gait ataxia-this has improved significantly with therapy there've been no recent falls to my knowledge.  #8-history of constipation as this has not been an issue recently she is on Colace.  #9-history of anxiety she is on Xanax routinely and also when necessary this appears to be effective she certainly appears calm and appropriate today which is her baseline  10-anemia secondary to B12 deficiency-this appears to be stable with recent B12 level-Will update a CBC.  .  #11-history of urinary retention-she continues on Flomax this has not been an issue recently and she does not complaining of dysuria.  [id number] note greater than 35 minutes spent assessing patient and formulating and coordinating plan of care for numerous diagnoses-greater than 50% of time spent coordinating plan of care ---   This encounter was created in error - please disregard.

## 2013-02-20 ENCOUNTER — Telehealth: Payer: Self-pay | Admitting: Internal Medicine

## 2013-02-20 MED ORDER — HYDROCODONE-ACETAMINOPHEN 10-325 MG PO TABS: ORAL_TABLET | ORAL | Status: DC

## 2013-02-20 NOTE — Telephone Encounter (Signed)
Error. Just medication refill

## 2013-02-24 DIAGNOSIS — R269 Unspecified abnormalities of gait and mobility: Secondary | ICD-10-CM | POA: Insufficient documentation

## 2013-02-24 DIAGNOSIS — I429 Cardiomyopathy, unspecified: Secondary | ICD-10-CM | POA: Insufficient documentation

## 2013-02-24 DIAGNOSIS — N289 Disorder of kidney and ureter, unspecified: Secondary | ICD-10-CM | POA: Insufficient documentation

## 2013-02-24 DIAGNOSIS — E039 Hypothyroidism, unspecified: Secondary | ICD-10-CM | POA: Insufficient documentation

## 2013-02-24 DIAGNOSIS — D649 Anemia, unspecified: Secondary | ICD-10-CM | POA: Insufficient documentation

## 2013-02-24 DIAGNOSIS — M25519 Pain in unspecified shoulder: Secondary | ICD-10-CM | POA: Insufficient documentation

## 2013-03-21 ENCOUNTER — Encounter: Payer: Self-pay | Admitting: Internal Medicine

## 2013-03-21 ENCOUNTER — Non-Acute Institutional Stay (SKILLED_NURSING_FACILITY): Payer: PRIVATE HEALTH INSURANCE | Admitting: Internal Medicine

## 2013-03-21 DIAGNOSIS — D649 Anemia, unspecified: Secondary | ICD-10-CM

## 2013-03-21 DIAGNOSIS — L259 Unspecified contact dermatitis, unspecified cause: Secondary | ICD-10-CM

## 2013-03-21 DIAGNOSIS — M109 Gout, unspecified: Secondary | ICD-10-CM

## 2013-03-21 DIAGNOSIS — I5022 Chronic systolic (congestive) heart failure: Secondary | ICD-10-CM

## 2013-03-21 DIAGNOSIS — I509 Heart failure, unspecified: Secondary | ICD-10-CM

## 2013-03-21 DIAGNOSIS — I495 Sick sinus syndrome: Secondary | ICD-10-CM

## 2013-03-21 DIAGNOSIS — D519 Vitamin B12 deficiency anemia, unspecified: Secondary | ICD-10-CM

## 2013-03-21 DIAGNOSIS — L309 Dermatitis, unspecified: Secondary | ICD-10-CM

## 2013-03-21 DIAGNOSIS — E039 Hypothyroidism, unspecified: Secondary | ICD-10-CM

## 2013-03-21 DIAGNOSIS — N183 Chronic kidney disease, stage 3 unspecified: Secondary | ICD-10-CM

## 2013-03-21 DIAGNOSIS — K5900 Constipation, unspecified: Secondary | ICD-10-CM

## 2013-03-21 DIAGNOSIS — N289 Disorder of kidney and ureter, unspecified: Secondary | ICD-10-CM

## 2013-03-21 DIAGNOSIS — D518 Other vitamin B12 deficiency anemias: Secondary | ICD-10-CM

## 2013-03-21 DIAGNOSIS — M25519 Pain in unspecified shoulder: Secondary | ICD-10-CM

## 2013-03-21 DIAGNOSIS — E785 Hyperlipidemia, unspecified: Secondary | ICD-10-CM

## 2013-03-21 NOTE — Progress Notes (Signed)
Patient ID: [name] [name], female   DOB: February 16, 1925, 79 y.o.   MRN: [id number] This is a routine visit.  Level of care skilled.  Facility [facility]   chief complaint-medical management of CHF chronic renal failure anemia hypothyroidism chronic left shoulder pain with history of rotator cuff tear gait abnormality .  History of present illness.  Patient is a very pleasant elderly resident with the above diagnoses she has done very well here.  She washospitalized for CHF and came back to the hospital with increased hypoxia.  She was started on IV Lasix this was transitioned to by mouth also had renal issues with a creatinine that rose above 2 her Lasix was reduced and creatinine did come down to about 1.5--this remained stable at 1.48 per labs done last month.  She continues on Lasix460 mg twice a day.  She also had a history of multiple falls before hospitalization however this has not really been an issue here to my knowledge-she seems to have adjusted well.  Her weight at this time appears to be relatively stable around 116 pounds.  She does have a history of a chronic left shoulder rotator cuff tear-she is on Norco twice a day routine  this appears to be effective .  Family medical social history has been reviewed per history and physical on 09/08/2012.   Medications have been reviewed per MAR.   Review of systems.  In general she denies any fever chills or weight appears to be relatively stable.  Skin-denies issues or rash or itching--there is an area on her right arm she would like me to look at she says it's been there for a little while and concerns her.  Head ears eyes nose mouth and throat-does not complaining of any visual changes sore throat or nasal discharge.  Respiratory-no complaints of shortness of breath or cough.  Cardiac-no complaints of chest pain history CHF which has been stable during her stay here.  GI-does not complaining of any nausea vomiting diarrhea or constipation.   GU-denies any dysuria.  Muscle skeletal other than left shoulder pain has no complaints-- the left shoulder pain apparently has been pretty well controlled with the Norco recently.  Neurologic no complaints of headache or dizziness.  Psych-appears to be in good spirits does not complaining of depression or anxiety--although she does have a history of anxiety and is on Xanax which apparently is working quite well.   Physical exam.  Temperature 97.3 respirations 16 pulse 70 blood pressure 128/69 weight is 116.1.  In general this is a very pleasant somewhat frail elderly female in no distress.  Her skin is warm and dry--on her lower right arm there is a raised area with it appears rolled  borders some crusting in the middle.  Eyes-pupils appear equal round reactive to light sclera and conjunctiva are clear.  Oropharynx is clear mucous membranes are moist.  Chest is clear to auscultation without any rhonchi rales or wheezes.  No labored breathing.  Heart-does have a 3/6 systolic murmur At his baseline I do not really see any significant lower extremity edema.--- It is regular rate and rhythm  abdomen-soft nontender with positive bowel sounds.  Muscle skeletal moves all extremities x4 at baseline she does have limited range of motion of her left shoulder I do not see any deformity here essentially baseline exam.--She is ambulating much better than when she came in  Neurologic I do not see any focal deficit--her speech is clear-I do not see any lateralizing findings .  Labs. 02/20/2013.  WBC 6.0 hemoglobin 11.3 platelets 182.  Sodium 1:30 potassium 4.3 BUN 30 creatinine 1.48.  Bilirubin 0.2  Liver function tests within normal limits except albumin 3.4.  ZOX-0.960.    01/12/2013.  T4-11 0.4-T3-57.9.  01/19/2013.  Sodium 137 potassium 3.6 BUN 24 creatinine 1.60.  01/14/2013.  WBC 6.8 hemoglobin 10.2 platelets 151.  11/17/2012.  Cholesterol 155 triglycerides 106 HDL 50 LDL 84.   [id number].  01/07/2013.  TSH 5.771  .  Assessment and plan.  #1-CHF this appears to be quite stable and quite mild her weight continues to be stable no complaints of shortness of breath she does continue on Lasix continue to monitor--she is on a beta blocker.  #2-history of chronic renal failure-Will update metabolic panel most recent lab appear to be at baseline Will have to watch this closely with her diuretics.  #3-history of tachybradycardia syndrome she is on the amiodarone as well as a beta blocker-this appears to be stable-she also continues on aspirin.--- is status post pacemaker implant  #4 hyperlipidemia-her recent lipid panel this appears to be stable she is on Lipitor will check liver function tests.  5-left shoulder pain with history rotator cuff tear-deemed not a surgical candidate presently-will change her Norco to 10/325 milligrams twice a day routine-- every 6 hours when necessary.  #6-hypothyroidism-recent TSH was within normal limits she is on Synthroid update TSH is pending.  #7-history of gait ataxia-this has improved significantly with therapy there've been no recent falls to my knowledge.  #8-history of constipation as this has not been an issue recently she is on Colace.  #9-history of anxiety she is on Xanax routinely and also when necessary this appears to be effective she certainly appears calm and appropriate today which is her baseline  10-anemia secondary to B12 deficiency-this appears to be stable with recent B12 level-Will update a CBC.  .  #11-history of urinary retention-she continues on Flomax this has not been an issue recently and she does not complaining of dysuria #12 rash dermatitis unspecified-lesion on her right arm is concerning we'll send her for a dermatology consult I suspect this will be biopsied  [id number]   .     This is a routine visit.  Level of care skilled.  Facility [facility]   chief complaint-medical management of CHF chronic renal  failure anemia hypothyroidism chronic left shoulder pain with history of rotator cuff tear gait abnormality .  History of present illness.  Patient is a very pleasant elderly resident with the above diagnoses she has done very well here.  She washospitalized for CHF and came back to the hospital with increased hypoxia.  She was started on IV Lasix this was transitioned to by mouth also had renal issues with a creatinine that rose above 2 her Lasix was reduced and creatinine did come down to about 1.5--this remained stable at 1.48 per labs done last month.  She continues on Lasix460 mg twice a day.  She also had a history of multiple falls before hospitalization however this has not really been an issue here to my knowledge-she seems to have adjusted well.  Her weight at this time appears to be relatively stable around 116 pounds.  She does have a history of a chronic left shoulder rotator cuff tear-she is on Norco twice a day routine  this appears to be effective .  Family medical social history has been reviewed per history and physical on 09/08/2012.   Medications have been reviewed per MAR.  Review of systems.  In general she denies any fever chills or weight appears to be relatively stable.  Skin-denies issues or rash or itching--there is an area on her right arm she would like me to look at she says it's been there for a little while and concerns her.  Head ears eyes nose mouth and throat-does not complaining of any visual changes sore throat or nasal discharge.  Respiratory-no complaints of shortness of breath or cough.  Cardiac-no complaints of chest pain history CHF which has been stable during her stay here.  GI-does not complaining of any nausea vomiting diarrhea or constipation.  GU-denies any dysuria.  Muscle skeletal other than left shoulder pain has no complaints-- the left shoulder pain apparently has been pretty well controlled with the Norco recently.  Neurologic no  complaints of headache or dizziness.  Psych-appears to be in good spirits does not complaining of depression or anxiety--although she does have a history of anxiety and is on Xanax which apparently is working quite well.   Physical exam.  Temperature 97.3 respirations 16 pulse 70 blood pressure 128/69 weight is 116.1.  In general this is a very pleasant somewhat frail elderly female in no distress.  Her skin is warm and dry--on her lower right arm there is a raised area with it appears rolled  borders some crusting in the middle.  Eyes-pupils appear equal round reactive to light sclera and conjunctiva are clear.  Oropharynx is clear mucous membranes are moist.  Chest is clear to auscultation without any rhonchi rales or wheezes.  No labored breathing.  Heart-does have a 3/6 systolic murmur At his baseline I do not really see any significant lower extremity edema.--- It is regular rate and rhythm  abdomen-soft nontender with positive bowel sounds.  Muscle skeletal moves all extremities x4 at baseline she does have limited range of motion of her left shoulder I do not see any deformity here essentially baseline exam.--She is ambulating much better than when she came in  Neurologic I do not see any focal deficit--her speech is clear-I do not see any lateralizing findings .  Labs. 02/20/2013.  WBC 6.0 hemoglobin 11.3 platelets 182.  Sodium 1:30 potassium 4.3 BUN 30 creatinine 1.48.  Bilirubin 0.2  Liver function tests within normal limits except albumin 3.4.  ZOX-0.960.    01/12/2013.  T4-11 0.4-T3-57.9.  01/19/2013.  Sodium 137 potassium 3.6 BUN 24 creatinine 1.60.  01/14/2013.  WBC 6.8 hemoglobin 10.2 platelets 151.  11/17/2012.  Cholesterol 155 triglycerides 106 HDL 50 LDL 84.  [id number].  01/07/2013.  TSH 5.771  .  Assessment and plan.  #1-CHF this appears to be quite stable and quite mild her weight continues to be stable no complaints of shortness of breath she does  continue on Lasix continue to monitor--she is on a beta blocker.  #2-history of chronic renal failure-Will update metabolic panel most recent lab appear to be at baseline Will have to watch this closely with her diuretics.  #3-history of tachybradycardia syndrome she is on the amiodarone as well as a beta blocker-this appears to be stable-she also continues on aspirin.--- is status post pacemaker implant  #4 hyperlipidemia-her recent lipid panel this appears to be stable she is on Lipitor will check liver function tests.  5-left shoulder pain with history rotator cuff tear-deemed not a surgical candidate presently-will change her Norco to 10/325 milligrams twice a day routine-- every 6 hours when necessary.  #6-hypothyroidism-recent TSH was within normal limits she is on Synthroid update  TSH is pending.  #7-history of gait ataxia-this has improved significantly with therapy there've been no recent falls to my knowledge.  #8-history of constipation as this has not been an issue recently she is on Colace.  #9-history of anxiety she is on Xanax routinely and also when necessary this appears to be effective she certainly appears calm and appropriate today which is her baseline  10-anemia secondary to B12 deficiency-this appears to be stable with recent B12 level-Will update a CBC.  .  #11-history of urinary retention-she continues on Flomax this has not been an issue recently and she     ---    This encounter was created in error - please disregard.

## 2013-04-09 ENCOUNTER — Other Ambulatory Visit: Payer: Self-pay | Admitting: *Deleted

## 2013-04-09 MED ORDER — HYDROCODONE-ACETAMINOPHEN 10-325 MG PO TABS: ORAL_TABLET | ORAL | Status: DC

## 2013-04-28 ENCOUNTER — Encounter: Payer: Self-pay | Admitting: Internal Medicine

## 2013-04-28 ENCOUNTER — Non-Acute Institutional Stay (SKILLED_NURSING_FACILITY): Payer: PRIVATE HEALTH INSURANCE | Admitting: Internal Medicine

## 2013-04-28 DIAGNOSIS — D649 Anemia, unspecified: Secondary | ICD-10-CM

## 2013-04-28 DIAGNOSIS — E785 Hyperlipidemia, unspecified: Secondary | ICD-10-CM

## 2013-04-28 DIAGNOSIS — D518 Other vitamin B12 deficiency anemias: Secondary | ICD-10-CM

## 2013-04-28 DIAGNOSIS — N183 Chronic kidney disease, stage 3 unspecified: Secondary | ICD-10-CM

## 2013-04-28 DIAGNOSIS — L0390 Cellulitis, unspecified: Secondary | ICD-10-CM

## 2013-04-28 DIAGNOSIS — I509 Heart failure, unspecified: Secondary | ICD-10-CM

## 2013-04-28 DIAGNOSIS — L0291 Cutaneous abscess, unspecified: Secondary | ICD-10-CM

## 2013-04-28 DIAGNOSIS — E039 Hypothyroidism, unspecified: Secondary | ICD-10-CM

## 2013-04-28 DIAGNOSIS — M25512 Pain in left shoulder: Secondary | ICD-10-CM

## 2013-04-28 DIAGNOSIS — D519 Vitamin B12 deficiency anemia, unspecified: Secondary | ICD-10-CM

## 2013-04-28 DIAGNOSIS — I429 Cardiomyopathy, unspecified: Secondary | ICD-10-CM

## 2013-04-28 DIAGNOSIS — N289 Disorder of kidney and ureter, unspecified: Secondary | ICD-10-CM

## 2013-04-28 DIAGNOSIS — I495 Sick sinus syndrome: Secondary | ICD-10-CM

## 2013-04-28 DIAGNOSIS — I5022 Chronic systolic (congestive) heart failure: Secondary | ICD-10-CM

## 2013-04-28 NOTE — Progress Notes (Signed)
Patient ID: [name] [name], female   DOB: 1925/06/27, 79 y.o.   MRN: [id number] This is a routine visit.  Level of care skilled.  Facility [facility]   chief complaint-medical management of CHF chronic renal failure anemia hypothyroidism chronic left shoulder pain with history of rotator cuff tear gait abnormality.   History of present illness.  Patient is a very pleasant elderly resident with the above diagnoses she has done very well here.  She was hospitalized for CHF and came back to the hospital with increased hypoxia.  She was started on IV Lasix this was transitioned to by mouth also had renal issues with a creatinine that rose above 2 her Lasix was reduced and creatinine did come down to about 1.5.  She continues on Lasix 40 mg twice a day.  She also had a history of multiple falls before hospitalization however this has not really been an issue here to my knowledge-she seems to have adjusted well.  Her weight at this time appears to be relatively stable around 116 pounds.--She has gained about 6 pounds since admission but this has been stable for some time  She does have a history of a chronic left shoulder rotator cuff tear--she is on Norco twice a day routine as well as when necessary every 6 hours and this appears to help Most acute issue has been some concern about an excision site on her right lower arm.  When I last saw her there was a suspicious looking lesion there she has been seen by dermatology and this has been excised apparently for biopsy-recently the area has developed some tenderness and there is an exudate- Today she has no  other acute complaints her vital signs continued to be stable .  Family medical social history has been reviewed per history and physical on 09/08/2012 .  Medications have been reviewed per MAR.   Review of systems.  In general she denies any fever chills or weight appears to be relatively stable.  Skin-denies issues or rash or itching. Does complain of  some pain around the excision site as noted above on her right lower arm-- Head ears eyes nose mouth and throat-does not complaining of any visual changes sore throat or nasal discharge.  Respiratory-no complaints of shortness of breath or cough.  Cardiac-no complaints of chest pain history CHF which has been stable during her stay here.  GI-does not complaining of any nausea vomiting diarrhea or constipation.  GU-denies any dysuria.  Muscle skeletal other than left shoulder pain has no complaints and she is actually not complaining about today although nursing staff says she does ask for her Norco quite frequently.  Neurologic no complaints of headache or dizziness.  Psych-appears to be in good spirits does not complaining of depression or anxiety--although she does have a history of anxiety and is on Xanax which apparently is working quite well .  Physical exam.  . temperature 96.6 pulse 69 respirations 21 blood pressure 138/53-131/77-weight 116.6 this appears to be stable  In general this is a very pleasant somewhat frail elderly female in no distress.  Her skin is warm and dry.--Right lower arm the excision site does have some exudate with some crusting around the borders of the excision site I do not see any surrounding erythema however there is some tenderness to palpation around the area I do not feel any firmness however  Eyes-pupils appear equal round reactive to light sclera and conjunctiva are clear.  Oropharynx is clear mucous membranes are moist.  Chest is clear to auscultation without any rhonchi rales or wheezes.  No labored breathing.  Heart-does have a 3/6 systolic murmur At  baseline I do not really see any significant lower extremity edema.--- It is regular rate and rhythm  abdomen-soft nontender with positive bowel sounds.  Muscle skeletal moves all extremities x4 at baseline she does have limited range of motion of her left shoulder I do not see any deformity here essentially  baseline exam.--She is ambulating relatively well Neurologic I do not see any focal deficit--her speech is clear-I do not see any lateralizing findings.  Labs.   04/20/2013.  TSH-1.272.  03/23/2013.  Sodium 135 potassium 4.3 BUN 31 creatinine 1.39.  02/23/2013.  Liver function tests within normal limits except albumin of 3.3 01/12/2013.  T4-11 0.4-T3-57.9.  01/19/2013.  Sodium 137 potassium 3.6 BUN 24 creatinine 1.60.  01/14/2013.  WBC 6.8 hemoglobin 10.2 platelets 151.  11/17/2012.  Cholesterol 155 triglycerides 106 HDL 50 LDL 84.  [id number].  01/07/2013.  TSH 5.771.  Assessment and plan.  #1-CHF this appears to be quite stable and quite mild her weight continues to be stable no complaints of shortness of breath she does continue on Lasix continue to monitor--she is on a beta blocker.  #2-history of chronic renal failure-Will update metabolic panel most recent lab appear to be at baseline Will have to watch this closely with her diuretics.  #3-history of tachybradycardia syndrome she is on the amiodarone as well as a beta blocker-this appears to be stable-she also continues on aspirin.--- is status post pacemaker implant  #4-question cellulitis right lower arm-status post excision for biopsy of possible skin cancer-she is having some tenderness here and a small amount of exudate will culture this and also start doxycycline 100 mg twice a day for 7 days-monitor for any progression of this--also probiotic twice a day for 7 days #5 hyperlipidemia-her recent lipid panel this appears to be stable -recent liver function tests were relatively unremarkable  6-left shoulder pain with history rotator cuff tear-deemed not a surgical candidate presently-will change her Norco to 10/325 milligrams twice a day routine-- every 6 hours when necessary.  #7-hypothyroidism-TSH was mildly elevated back in May-medication adjustments were made her TSH appears to have normalized.  #8-history of gait  ataxia-this has improved significantly with therapy there've been no recent falls to my knowledge.  #9-history of constipation as this has not been an issue recently she is on Colace.  #10-history of anxiety she is on Xanax routinely and also when necessary this appears to be effective she certainly appears calm and appropriate today which is her baseline  10-anemia secondary to B12 deficiency-this appears to be stable with recent B12 level-Will update a CBC.  .  #12-history of urinary retention-she continues on Flomax this has not been an issue recently and she does not complaining of dysuria.   [id number]   This encounter was created in error - please disregard.

## 2013-04-29 ENCOUNTER — Non-Acute Institutional Stay (SKILLED_NURSING_FACILITY): Payer: PRIVATE HEALTH INSURANCE | Admitting: Internal Medicine

## 2013-04-29 DIAGNOSIS — IMO0002 Reserved for concepts with insufficient information to code with codable children: Secondary | ICD-10-CM

## 2013-05-06 NOTE — Progress Notes (Signed)
Patient ID: TEAUNA DUBACH, female   DOB: May 10, 1925, 79 y.o.   MRN: [id number]           PROGRESS NOTE  DATE:  04/29/2013  FACILITY: [facility]    LEVEL OF CARE:   SNF   Acute Visit   CHIEF COMPLAINT:  Lesion on her right arm.    HISTORY OF PRESENT ILLNESS:   Apparently, Mrs. Venne went to a dermatologist and had a lesion removed on her right anterior arm.  I do not see any information on this.  In any case, there is concern about this being infected.  She is now on doxycycline.  A swab has been done.  I have been asked to look at the area.    PHYSICAL EXAMINATION:   SKIN:  INSPECTION:  Right arm:  There is indeed a dime-shaped area which is presumably the site of a shave biopsy.  This is somewhat covered in an adherent eschar.  At this point, there is no surrounding cellulitis.     ASSESSMENT/PLAN:  Post lesion removal from the right arm.  I think the area is going to need more careful attention, for now simply washing the area and a topical antibiotic.  She has been started on doxycycline.  I am not opposed to that, although I do not think there is a significant cellulitis here.  This may require further debridement if this has not progressed towards a cleaner wound bed and closure.     CPT CODE: [id number]

## 2013-05-19 ENCOUNTER — Other Ambulatory Visit: Payer: Self-pay

## 2013-05-19 MED ORDER — HYDROCODONE-ACETAMINOPHEN 10-325 MG PO TABS: ORAL_TABLET | ORAL | Status: DC

## 2013-05-19 NOTE — Telephone Encounter (Signed)
Verified dose and instructions reflect manual request received by nursing home.   

## 2013-06-11 NOTE — Progress Notes (Signed)
Patient ID: [name] [name], female   DOB: 26-Mar-1925, 79 y.o.   MRN: [id number]  This is a routine visit.  Level of care skilled.  Facility [facility]   chief complaint-medical management of CHF chronic renal failure anemia hypothyroidism chronic left shoulder pain with history of rotator cuff tear gait abnormality.  History of present illness.  Patient is a very pleasant elderly resident with the above diagnoses she has done very well here.  She [place] hospitalized for CHF and came back to the hospital with increased hypoxia.  She was started on IV Lasix this was transitioned to by mouth also had renal issues with a creatinine that rose above 2 her Lasix was reduced and creatinine did come down to about 1.5.  She continues on Lasix 60 mg twice a day.  She also had a history of multiple falls before hospitalization however this has not really been an issue here to my knowledge-she seems to have adjusted well.  Her weight at this time appears to be relatively stable around 110 pounds.  She does have a history of a chronic left shoulder rotator cuff tear-she has been asking for her Norco regularly I suspect we will make this routine twice a day  hopefully some more consistent relief  Today she has no acute complaints her vital signs continued to be stable.  Family medical social history has been reviewed per history and physical on 09/08/2012.  Medications have been reviewed per MAR.  Review of systems.  In general she denies any fever chills or weight appears to be relatively stable.  Skin-denies issues or rash or itching.  Head ears eyes nose mouth and throat-does not complaining of any visual changes sore throat or nasal discharge.  Respiratory-no complaints of shortness of breath or cough.  Cardiac-no complaints of chest pain history CHF which has been stable during her stay here.  GI-does not complaining of any nausea vomiting diarrhea or constipation.  GU-denies any  dysuria.  Muscle skeletal other than left shoulder pain has no complaints and she is actually not complaining about today although nursing staff says she does ask for her Norco quite frequently.  Neurologic no complaints of headache or dizziness.  Psych-appears to be in good spirits does not complaining of depression or anxiety--although she does have a history of anxiety and is on Xanax which apparently is working quite well.  Physical exam.  Temperature is 98.5 pulse 70 respirations 20 blood pressure 124/57 O2 saturation is in the 90s.  In general this is a very pleasant somewhat frail elderly female in no distress.  Her skin is warm and dry.   Eyes-pupils appear equal round reactive to light sclera and conjunctiva are clear.  Oropharynx is clear mucous membranes are moist.  Chest is clear to auscultation without any rhonchi rales or wheezes.  No labored breathing.  Heart-does have a 3/6 systolic murmur At his baseline I do not really see any significant lower extremity edema.--- It is regular rate and rhythm   abdomen-soft nontender with positive bowel sounds.  Muscle skeletal moves all extremities x4 at baseline she does have limited range of motion of her left shoulder I do not see any deformity here essentially baseline exam.--She is ambulating much better than when she came in  Neurologic I do not see any focal deficit--her speech is clear-I do not see any lateralizing findings.  Labs.  01/12/2013.  T4-11 0.4-T3-57.9.  01/19/2013.  Sodium 137 potassium 3.6 BUN 24 creatinine 1.60.  01/14/2013.  WBC 6.8 hemoglobin 10.2 platelets 151.   11/17/2012.  Cholesterol 155 triglycerides 106 HDL 50 LDL 84.  [id number].  01/07/2013.  TSH 5.771.  Assessment and plan.  #1-CHF this appears to be quite stable and quite mild her weight continues to be stable no complaints of shortness of breath she does continue on Lasix continue to monitor--she is on a beta  blocker.  #2-history of chronic renal failure-Will update metabolic panel most recent lab appear to be at baseline Will have to watch this closely with her diuretics.  #3-history of tachybradycardia syndrome she is on the amiodarone as well as a beta blocker-this appears to be stable-she also continues on aspirin.---  is status post pacemaker implant  #4 hyperlipidemia-her recent lipid panel this appears to be stable she is on Lipitor will check liver function tests.  5-left shoulder pain with history rotator cuff tear-deemed not a surgical candidate presently-will change her Norco to 10/325 milligrams twice a day routine-- every 6 hours when necessary.  #6-hypothyroidism-TSH was mildly elevated back in May-will increase Synthroid slightly to 88 mcg a day and monitor--do note T4 was within normal limits T3 was low--- this is a borderline situation.  #7-history of gait ataxia-this has improved significantly with therapy there've been no recent falls to my knowledge.  #8-history of constipation as this has not been an issue recently she is on Colace.  #9-history of anxiety she is on Xanax routinely and also when necessary this appears to be effective she certainly appears calm and appropriate today which is her baseline  10-anemia secondary to B12 deficiency-this appears to be stable with recent B12 level-Will update a CBC.  .  #11-history of urinary retention-she continues on Flomax this has not been an issue recently and she does not complaining of dysuria.  [id number] note greater than 35 minutes spent assessing patient and formulating and coordinating plan of care for numerous diagnoses-greater than 50% of time spent coordinating plan of care ---

## 2013-06-12 ENCOUNTER — Non-Acute Institutional Stay (SKILLED_NURSING_FACILITY): Payer: PRIVATE HEALTH INSURANCE | Admitting: Internal Medicine

## 2013-06-12 DIAGNOSIS — H9209 Otalgia, unspecified ear: Secondary | ICD-10-CM

## 2013-06-12 DIAGNOSIS — I495 Sick sinus syndrome: Secondary | ICD-10-CM

## 2013-06-12 DIAGNOSIS — I5022 Chronic systolic (congestive) heart failure: Secondary | ICD-10-CM

## 2013-06-12 DIAGNOSIS — F411 Generalized anxiety disorder: Secondary | ICD-10-CM

## 2013-06-12 DIAGNOSIS — N289 Disorder of kidney and ureter, unspecified: Secondary | ICD-10-CM

## 2013-06-12 DIAGNOSIS — K5900 Constipation, unspecified: Secondary | ICD-10-CM

## 2013-06-12 DIAGNOSIS — F419 Anxiety disorder, unspecified: Secondary | ICD-10-CM

## 2013-06-12 DIAGNOSIS — E785 Hyperlipidemia, unspecified: Secondary | ICD-10-CM

## 2013-06-12 DIAGNOSIS — H9201 Otalgia, right ear: Secondary | ICD-10-CM

## 2013-06-12 DIAGNOSIS — I509 Heart failure, unspecified: Secondary | ICD-10-CM

## 2013-06-12 DIAGNOSIS — E039 Hypothyroidism, unspecified: Secondary | ICD-10-CM

## 2013-06-12 DIAGNOSIS — M25519 Pain in unspecified shoulder: Secondary | ICD-10-CM

## 2013-06-12 DIAGNOSIS — R339 Retention of urine, unspecified: Secondary | ICD-10-CM

## 2013-06-12 NOTE — Progress Notes (Signed)
Patient ID: HARA MILHOLLAND, female   DOB: [birth year], 79 y.o.   MRN: [id number] This is a routine visit.  Level of care skilled.  Facility [facility]  chief complaint-medical management of CHF chronic renal failure anemia hypothyroidism chronic left shoulder pain with history of rotator cuff tear gait abnormality.   History of present illness.  Patient is a very pleasant elderly resident with the above diagnoses she has done very well here.  She was hospitalized for CHF and came back to the hospital with increased hypoxia.  She was started on IV Lasix this was transitioned to by mouth also had renal issues with a creatinine that rose above 2 her Lasix was reduced and creatinine did come down to about 1.5.  She continues on Lasix 40 mg twice a day.  She also had a history of multiple falls before hospitalization however this has not really been an issue here to my knowledge-she seems to have adjusted well.  Her weight at this time appears to be relatively stable around 115 pounds.--She has gained about 6 pounds since admission but this has been stable for some time  She does have a history of a chronic left shoulder rotator cuff tear--she is on Norco twice a day routine as well as when necessary every 6 hours and this appears to help--she also gets periodic steroid injections from orthopedics  Her only complaint tonight is some right ear discomfort-she says she was out in the wind recently and she has said that she's had intermittent ear discomfort since then-apparently this is not persistent she says she mainly feelsit  when she lies down on that side of her head or puts pressure on her ear--she denies any hearing loss.  She recently had a lesion on her right arm biopsied by dermatology-initially there were some concerns of possible infection but this appears to be quite benign on appearance tonight she was treated with a short course of doxycycline.  Apparently the lesion was benign but would like to  confirm this   -  Today she has no other acute complaints her vital signs continued to be stable  .  Family medical social history has been reviewed per history and physical on 09/08/2012  .  Medications have been reviewed per MAR.  Review of systems.  In general she denies any fever chills or weight appears to be relatively stable.  Skin-denies issues or rash or itching.-  Head ears eyes nose mouth and throat-does not complaining of any visual changes sore throat or nasal discharge--intermittent right ear discomfort as noted above- no hearing loss.  Respiratory-no complaints of shortness of breath or cough.  Cardiac-no complaints of chest pain history CHF which has been stable during her stay here.  GI-does not complaining of any nausea vomiting diarrhea or constipation.  GU-denies any dysuria.  Muscle skeletal other than left shoulder pain has no complaints and she is actually not complaining about today although nursing staff says she does ask for her Norco quite frequently.  Neurologic no complaints of headache or dizziness.  Psych-appears to be in good spirits does not complaining of depression or anxiety--although she does have a history of anxiety and is on Xanax which apparently is working quite well  .  Physical exam.  Temperature 98.0 pulse 71 respirations 18 blood pressure 136/45-111/70-145/81-in this range-O2 saturations in the 90s-weight is relatively stable at 114 .   In general this is a very pleasant somewhat frail elderly female in no distress.  Her skin  is warm and dry.--Right lower arm the excision site appears healed I do not see any sign of infection   Eyes-pupils appear equal round reactive to light sclera and conjunctiva are clear. Ears-right ear tympanic membrane is visualized I do not really see any exudate or erythema or bleeding here there is some tenderness when I examined it and put pressure on her ear-appears there may be some slight irritation noted in the  ear canal-- her hearing appears to be intact.  Left ear tympanic membrane is visualized minimal wax no exudate no erythema noted  I cannot appreciate any facial pain to palpation in the area or adenopathy   Oropharynx is clear mucous membranes are moist.  Chest is clear to auscultation without any rhonchi rales or wheezes.  No labored breathing.  Heart-does have a 3/6 systolic murmur At baseline I do not really see any significant lower extremity edema.--- It is regular rate and rhythm  abdomen-soft nontender with positive bowel sounds.  Muscle skeletal moves all extremities x4 at baseline she does have limited range of motion of her left shoulder I do not see any deformity here essentially baseline exam.--She is ambulating relatively well  Neurologic I do not see any focal deficit--her speech is clear-I do not see any lateralizing findings.   Labs 04/29/2013.  WBC 5.5 hemoglobin 11.7 platelets 175.  Sodium 133 potassium 4.3 BUN 27 creatinine 1.48.  Marland Kitchen  04/20/2013.  TSH-1.272.  03/23/2013.  Sodium 135 potassium 4.3 BUN 31 creatinine 1.39.  02/23/2013.  Liver function tests within normal limits except albumin of 3.3   01/12/2013.  T4-11 0.4-T3-57.9.  01/19/2013.  Sodium 137 potassium 3.6 BUN 24 creatinine 1.60.  01/14/2013.  WBC 6.8 hemoglobin 10.2 platelets 151.  11/17/2012.  Cholesterol 155 triglycerides 106 HDL 50 LDL 84.  [id number].  01/07/2013.  TSH 5.771 .  Assessment and plan.  #1-CHF this appears to be quite stable and quite mild her weight continues to be stable no complaints of shortness of breath she does continue on Lasix continue to monitor--she is on a beta blocker.  #2-history of chronic renal failure-Will update metabolic panel most recent lab appear to be at baseline Will have to watch this closely with her diuretics.  #3-history of tachybradycardia syndrome she is on the amiodarone as well as a beta blocker-this appears to be stable-she also continues on  aspirin.--- is status post pacemaker implant  #4-history of skin lesion removal right lower arm-this appears resolved-apparently biopsy was benign according to patient would like to get confirmation from dermatology on this-   #5 hyperlipidemia-her recent lipid panel this appears to be stable -recent liver function tests were relatively unremarkable  6-left shoulder pain with history rotator cuff tear-deemed not a surgical candidate presently-on Norco  10/325 milligrams twice a day routine-- every 6 hours when necessary.--She also has seen orthopedic sfor steroid injection she did receive one it appears about a week ago -- #7-hypothyroidism-TSH was mildly elevated back in May-medication adjustments were made her TSH appears to have normalized--will recheck this.  #8-history of gait ataxia-this has improved significantly with therapy there've been no recent falls to my knowledge.  #9-history of constipation as this has not been an issue recently she is on Colace.  #10-history of anxiety she is on Xanax routinely and also when necessary this appears to be effective she certainly appears calm and appropriate today which is her baseline  10-anemia secondary to B12 deficiency-this appears to be stable with recent B12 level-Will update a CBC.  Marland Kitchen  #  12-history of urinary retention-she continues on Flomax this has not been an issue recently and she does not complaii of dysuria  #13-right ear discomfort-I do not really see a sign of infection exudate or wax buildup at this time-this may have  been irritated by wind apparently patient also has been using certain l objects to clear wax away I advised her to not do this anymore I suspect it will continue to be a challenge--monitor for any continued right ear discomfortl.   [id number]

## 2013-06-12 NOTE — Progress Notes (Signed)
Patient ID: [name] [name], female   DOB: 11-16-1924, 79 y.o.   MRN: [id number]  This is a routine visit.  Level of care skilled.  Facility [facility]   chief complaint-medical management of CHF chronic renal failure anemia hypothyroidism chronic left shoulder pain with history of rotator cuff tear gait abnormality.   History of present illness.  Patient is a very pleasant elderly resident with the above diagnoses she has done very well here.  She was hospitalized for CHF and came back to the hospital with increased hypoxia.  She was started on IV Lasix this was transitioned to by mouth also had renal issues with a creatinine that rose above 2 her Lasix was reduced and creatinine did come down to about 1.5.  She continues on Lasix 40 mg twice a day.  She also had a history of multiple falls before hospitalization however this has not really been an issue here to my knowledge-she seems to have adjusted well.  Her weight at this time appears to be relatively stable around 116 pounds.--She has gained about 6 pounds since admission but this has been stable for some time  She does have a history of a chronic left shoulder rotator cuff tear--she is on Norco twice a day routine as well as when necessary every 6 hours and this appears to help Most acute issue has been some concern about an excision site on her right lower arm.  When I last saw her there was a suspicious looking lesion there she has been seen by dermatology and this has been excised apparently for biopsy-recently the area has developed some tenderness and there is an exudate- Today she has no  other acute complaints her vital signs continued to be stable .  Family medical social history has been reviewed per history and physical on 09/08/2012 .  Medications have been reviewed per MAR.   Review of systems.  In general she denies any fever chills or weight appears to be relatively stable.  Skin-denies issues or rash or itching. Does  complain of some pain around the excision site as noted above on her right lower arm-- Head ears eyes nose mouth and throat-does not complaining of any visual changes sore throat or nasal discharge.  Respiratory-no complaints of shortness of breath or cough.  Cardiac-no complaints of chest pain history CHF which has been stable during her stay here.  GI-does not complaining of any nausea vomiting diarrhea or constipation.  GU-denies any dysuria.  Muscle skeletal other than left shoulder pain has no complaints and she is actually not complaining about today although nursing staff says she does ask for her Norco quite frequently.  Neurologic no complaints of headache or dizziness.  Psych-appears to be in good spirits does not complaining of depression or anxiety--although she does have a history of anxiety and is on Xanax which apparently is working quite well .  Physical exam.  . temperature 96.6 pulse 69 respirations 21 blood pressure 138/53-131/77-weight 116.6 this appears to be stable  In general this is a very pleasant somewhat frail elderly female in no distress.  Her skin is warm and dry.--Right lower arm the excision site does have some exudate with some crusting around the borders of the excision site I do not see any surrounding erythema however there is some tenderness to palpation around the area I do not feel any firmness however  Eyes-pupils appear equal round reactive to light sclera and conjunctiva are clear.  Oropharynx is clear mucous membranes  are moist.  Chest is clear to auscultation without any rhonchi rales or wheezes.  No labored breathing.  Heart-does have a 3/6 systolic murmur At  baseline I do not really see any significant lower extremity edema.--- It is regular rate and rhythm  abdomen-soft nontender with positive bowel sounds.  Muscle skeletal moves all extremities x4 at baseline she does have limited range of motion of her left shoulder I do not see any deformity here  essentially baseline exam.--She is ambulating relatively well Neurologic I do not see any focal deficit--her speech is clear-I do not see any lateralizing findings.  Labs.   04/20/2013.  TSH-1.272.  03/23/2013.  Sodium 135 potassium 4.3 BUN 31 creatinine 1.39.  02/23/2013.  Liver function tests within normal limits except albumin of 3.3 01/12/2013.  T4-11 0.4-T3-57.9.  01/19/2013.  Sodium 137 potassium 3.6 BUN 24 creatinine 1.60.  01/14/2013.  WBC 6.8 hemoglobin 10.2 platelets 151.  11/17/2012.  Cholesterol 155 triglycerides 106 HDL 50 LDL 84.  [id number].  01/07/2013.  TSH 5.771.  Assessment and plan.  #1-CHF this appears to be quite stable and quite mild her weight continues to be stable no complaints of shortness of breath she does continue on Lasix continue to monitor--she is on a beta blocker.  #2-history of chronic renal failure-Will update metabolic panel most recent lab appear to be at baseline Will have to watch this closely with her diuretics.  #3-history of tachybradycardia syndrome she is on the amiodarone as well as a beta blocker-this appears to be stable-she also continues on aspirin.--- is status post pacemaker implant  #4-question cellulitis right lower arm-status post excision for biopsy of possible skin cancer-she is having some tenderness here and a small amount of exudate will culture this and also start doxycycline 100 mg twice a day for 7 days-monitor for any progression of this--also probiotic twice a day for 7 days #5 hyperlipidemia-her recent lipid panel this appears to be stable -recent liver function tests were relatively unremarkable  6-left shoulder pain with history rotator cuff tear-deemed not a surgical candidate presently-will change her Norco to 10/325 milligrams twice a day routine-- every 6 hours when necessary.  #7-hypothyroidism-TSH was mildly elevated back in May-medication adjustments were made her TSH appears to have normalized.  #8-history of  gait ataxia-this has improved significantly with therapy there've been no recent falls to my knowledge.  #9-history of constipation as this has not been an issue recently she is on Colace.  #10-history of anxiety she is on Xanax routinely and also when necessary this appears to be effective she certainly appears calm and appropriate today which is her baseline  10-anemia secondary to B12 deficiency-this appears to be stable with recent B12 level-Will update a CBC.  .  #12-history of urinary retention-she continues on Flomax this has not been an issue recently and she does not complaining of dysuria.   [id number]

## 2013-06-12 NOTE — Progress Notes (Signed)
Patient ID: [name] [name], female   DOB: [birth year], 79 y.o.   MRN: [id number]    f    ---    This encounter was created in error - please disregard.

## 2013-06-12 NOTE — Progress Notes (Signed)
Patient ID: [name] [name], female   DOB: 1925/03/13, 79 y.o.   MRN: [id number]  This is a routine visit.  Level of care skilled.  Facility [facility]   chief complaint-medical management of CHF chronic renal failure anemia hypothyroidism chronic left shoulder pain with history of rotator cuff tear gait abnormality .  History of present illness.  Patient is a very pleasant elderly resident with the above diagnoses she has done very well here.  She was hospitalized for CHF and came back to the hospital with increased hypoxia.  She was started on IV Lasix this was transitioned to by mouth also had renal issues with a creatinine that rose above 2 her Lasix was reduced and creatinine did come down to about 1.5--this remained stable at 1.48 per labs done last month.  She continues on Lasix460 mg twice a day.  She also had a history of multiple falls before hospitalization however this has not really been an issue here to my knowledge-she seems to have adjusted well.  Her weight at this time appears to be relatively stable around 116 pounds.  She does have a history of a chronic left shoulder rotator cuff tear-she is on Norco twice a day routine  this appears to be effective .  Family medical social history has been reviewed per history and physical on 09/08/2012.   Medications have been reviewed per MAR.   Review of systems.  In general she denies any fever chills or weight appears to be relatively stable.  Skin-denies issues or rash or itching--there is an area on her right arm she would like me to look at she says it's been there for a little while and concerns her.  Head ears eyes nose mouth and throat-does not complaining of any visual changes sore throat or nasal discharge.  Respiratory-no complaints of shortness of breath or cough.  Cardiac-no complaints of chest pain history CHF which has been stable during her stay here.  GI-does not complaining of any nausea vomiting diarrhea or constipation.   GU-denies any dysuria.  Muscle skeletal other than left shoulder pain has no complaints-- the left shoulder pain apparently has been pretty well controlled with the Norco recently.  Neurologic no complaints of headache or dizziness.  Psych-appears to be in good spirits does not complaining of depression or anxiety--although she does have a history of anxiety and is on Xanax which apparently is working quite well.   Physical exam.  Temperature 97.3 respirations 16 pulse 70 blood pressure 128/69 weight is 116.1.  In general this is a very pleasant somewhat frail elderly female in no distress.  Her skin is warm and dry--on her lower right arm there is a raised area with it appears rolled  borders some crusting in the middle.  Eyes-pupils appear equal round reactive to light sclera and conjunctiva are clear.  Oropharynx is clear mucous membranes are moist.  Chest is clear to auscultation without any rhonchi rales or wheezes.  No labored breathing.  Heart-does have a 3/6 systolic murmur At his baseline I do not really see any significant lower extremity edema.--- It is regular rate and rhythm  abdomen-soft nontender with positive bowel sounds.  Muscle skeletal moves all extremities x4 at baseline she does have limited range of motion of her left shoulder I do not see any deformity here essentially baseline exam.--She is ambulating much better than when she came in  Neurologic I do not see any focal deficit--her speech is clear-I do not see any  lateralizing findings .  Labs. 02/20/2013.  WBC 6.0 hemoglobin 11.3 platelets 182.  Sodium 1:30 potassium 4.3 BUN 30 creatinine 1.48.  Bilirubin 0.2  Liver function tests within normal limits except albumin 3.4.  ZOX-0.960.    01/12/2013.  T4-11 0.4-T3-57.9.  01/19/2013.  Sodium 137 potassium 3.6 BUN 24 creatinine 1.60.  01/14/2013.  WBC 6.8 hemoglobin 10.2 platelets 151.  11/17/2012.  Cholesterol 155 triglycerides 106 HDL 50 LDL 84.   [id number].  01/07/2013.  TSH 5.771  .  Assessment and plan.  #1-CHF this appears to be quite stable and quite mild her weight continues to be stable no complaints of shortness of breath she does continue on Lasix continue to monitor--she is on a beta blocker.  #2-history of chronic renal failure-Will update metabolic panel most recent lab appear to be at baseline Will have to watch this closely with her diuretics.  #3-history of tachybradycardia syndrome she is on the amiodarone as well as a beta blocker-this appears to be stable-she also continues on aspirin.--- is status post pacemaker implant  #4 hyperlipidemia-her recent lipid panel this appears to be stable she is on Lipitor will check liver function tests.  5-left shoulder pain with history rotator cuff tear-deemed not a surgical candidate presently-will change her Norco to 10/325 milligrams twice a day routine-- every 6 hours when necessary.  #6-hypothyroidism-recent TSH was within normal limits she is on Synthroid update TSH is pending.  #7-history of gait ataxia-this has improved significantly with therapy there've been no recent falls to my knowledge.  #8-history of constipation as this has not been an issue recently she is on Colace.  #9-history of anxiety she is on Xanax routinely and also when necessary this appears to be effective she certainly appears calm and appropriate today which is her baseline  10-anemia secondary to B12 deficiency-this appears to be stable with recent B12 level-Will update a CBC.  .  #11-history of urinary retention-she continues on Flomax this has not been an issue recently and she does not complaining of dysuria #12 rash dermatitis unspecified-lesion on her right arm is concerning we'll send her for a dermatology consult I suspect this will be biopsied  [id number]   .

## 2013-06-27 ENCOUNTER — Ambulatory Visit (HOSPITAL_COMMUNITY)
Admit: 2013-06-27 | Discharge: 2013-06-27 | Disposition: A | Discharge status: Home / Self Care | Payer: Medicare Other | Attending: Internal Medicine | Admitting: Internal Medicine

## 2013-06-27 ENCOUNTER — Non-Acute Institutional Stay (SKILLED_NURSING_FACILITY): Payer: PRIVATE HEALTH INSURANCE | Admitting: Internal Medicine

## 2013-06-27 DIAGNOSIS — I5022 Chronic systolic (congestive) heart failure: Secondary | ICD-10-CM

## 2013-06-27 DIAGNOSIS — I509 Heart failure, unspecified: Secondary | ICD-10-CM

## 2013-06-27 DIAGNOSIS — R0902 Hypoxemia: Secondary | ICD-10-CM | POA: Insufficient documentation

## 2013-06-27 DIAGNOSIS — R079 Chest pain, unspecified: Secondary | ICD-10-CM

## 2013-06-27 DIAGNOSIS — R509 Fever, unspecified: Secondary | ICD-10-CM

## 2013-06-27 DIAGNOSIS — I2510 Atherosclerotic heart disease of native coronary artery without angina pectoris: Secondary | ICD-10-CM

## 2013-06-27 DIAGNOSIS — I517 Cardiomegaly: Secondary | ICD-10-CM | POA: Insufficient documentation

## 2013-06-27 NOTE — Progress Notes (Signed)
Patient ID: [name] [name], female   DOB: 1924-08-26, 79 y.o.   MRN: [id number]  This is an acute visit.  Level of care skilled.  Facility [facility]  Chief complaint acute visit secondary to fever-hypoxia-patient just not feeling well.  History of present illness.  Patient is a very pleasant 79 year old female with a somewhat involved medical history that has been quite stable for some time.  She does have a history of CHF had been hospitalized for back earlier this year she does continue on Lasix.--As well as a beta blocker-this is somewhat complicated with mild renal insufficiency which is being monitored and has been stable  Also has a history of coronary artery disease status post stenting as well as CABG in the past and a pacemaker insertion secondary to tachybradycardia syndrome  Apparently earlier this morning patient experienced some chest pain-had nitroglycerin twice and this did give some relief it was noted that she had mild hypoxia with an O2 saturation in the 80s on room air-this apparently quickly rose into the 90s when she was given oxygen-3 L.  The chest pain essentially disappeared and she is no longer complained of this past an EKG was ordered which showed a paced rhythm-she did not want to go to the ER for evaluation.  She was afebrile-however later today when I was in the facility she did spike a low-grade temperature of 101-she complained of feeling chilly and somewhat weak but denied any chest pain.  Also did not complain of any shortness of breath  Her main complaint is feeling weak and sick.  Family medical social history has been reviewed per history and physical on 09/08/2012.  Medications have been reviewed per MAR.  Review of systems.  In general does complain of feeling somewhat chilly she has a low-grade temperature.  Skin-does not complaining of any rashes or itching she did have a possible skin cancer removed from her lower right arm recently this is  followed by dermatology.  Head ears eyes nose mouth and throat-he did not complain of any difficulty swallowing or sore throat or visual changes or nasal discharge.  Respiratory-does not complaining of shortness of breath or cough she has had some hypoxia as noted above.  GI-she states she did vomit once earlier today-does not really complain of abdominal pain now or nausea.  GU-does not complaining of dysuria or burning.  Muscle skeletal does have some left shoulder pain at times this is chronic and has received steroid injections in the past.--Says her arm does feel somewhat sore this afternoon.  Neurologic does not complaining of any dizziness or headache or syncopal type feeling.  Psych she does have a history of anxiety this appears to be pretty well-controlled for an extended period of time.  Physical exam.  Temperature is 101.0 rectally--- pulse is 83 respirations of 19 blood pressure manually 154/82-O2 saturation is in the mid 90s on 2 L via nasal cannula.  In general this is a somewhat weaker appearing elderly female that I am used to seeing initially was lying with her head at the foot of the bed but was able to sit up  Skin is warm and dry she does have numerous chronic bruising which is not new.  Eyes pupils appear equal round react to light sclerae and conjunctivae are clear.  Oropharynx clear mucous membranes moist.  Chest is clear to auscultation without rhonchi rales or wheezes there is no labored breathing.  Heart is regular rate and rhythm with a 3/6 systolic murmur  which is chronic there is no significant lower extremity edema.  Abdomen is soft does not appear to be acutely tender there are positive bowel sounds.  Muscle skeletal moves all her extremities at baseline with limited range of motion of her left shoulder which is not new as well.  Neurologic she appears grossly intact her speech is clear.  Psych she appears grossly alert and oriented and at her  baseline pleasant but  weak-appearing today  .  Labs 06/15/2013.  TSH-1.2.  WBC 5.7 hemoglobin 11.7 platelets 174.  Sodium 137 potassium 3.9 BUN 27 creatinine 1.43 02/20/2013.  Liver function tests within normal limits except total bilirubin minimally depressed at 0.2 albumin of 3.4  Assessment and plan.  #1-Hypoxia with fever-with this presentation would be somewhat concerned about a URI-Will obtain a chest x-ray-also will obtain a CBC with differential as well as basic metabolic panel to see what her I count and electrolytes as well as renal function are doing--we'll empirically start Rocephin 1 g IM daily x7 days.  In regards to her earlier chest pain this appears to be resolved again this could be respiratory but will order troponin and CK and CK-MB levels secondary to her cardiac history also will obtain a BNP with her history of CHF.  We'll obtain vital signs every shift with pulse ox in oxygen when necessary via nasal cannula to keep sats greater than or equal to 90%  Addendum.  We have received the labs which show white count of 5.7 hemoglobin of 10.6 which is relatively baseline platelets slightly low at 135.  Sodium 132 potassium 4.2 BUN 27 creatinine 1.25.  CK was within normal limits at 25 and troponin 1 was less than 0.3.  ZOX-09,604.  I did reevaluate Sherma-she appears to be at baseline appears to be somewhat stronger but certainly still not herself-her vital signs continued to be stable as well as physical exam essentially unchanged.  Secondary to her somewhat elevated BNP-I did review this in EPIC--and during hospitalization in January her BNP was over 30,000-it appears her low baseline  is in the 03-8999 range---- In addition to the above measures we will increase her Lasix slightly to 60 mg every morning 40 mg every afternoon and also her potassium slightly to 20 mEq a day--- will do this for 3 days---update a basic metabolic panel midweek next week as well as  in 10 days to ensure stability of her electrolytes as well as renal function  [id number]-of note more than 45 minutes spent assessing patient-reassessing patient-and coordinating and formulating plan of care    .        Marland Kitchen

## 2013-07-03 ENCOUNTER — Other Ambulatory Visit: Payer: Self-pay

## 2013-07-03 MED ORDER — HYDROCODONE-ACETAMINOPHEN 10-325 MG PO TABS: 1.0000 | ORAL_TABLET | Freq: Four times a day (QID) | ORAL | Status: DC | PRN

## 2013-08-11 ENCOUNTER — Encounter: Payer: Self-pay | Admitting: Internal Medicine

## 2013-08-11 ENCOUNTER — Other Ambulatory Visit: Payer: Self-pay | Admitting: *Deleted

## 2013-08-11 DIAGNOSIS — D649 Anemia, unspecified: Secondary | ICD-10-CM

## 2013-08-11 DIAGNOSIS — I495 Sick sinus syndrome: Secondary | ICD-10-CM

## 2013-08-11 DIAGNOSIS — E039 Hypothyroidism, unspecified: Secondary | ICD-10-CM

## 2013-08-11 DIAGNOSIS — R269 Unspecified abnormalities of gait and mobility: Secondary | ICD-10-CM

## 2013-08-11 DIAGNOSIS — N289 Disorder of kidney and ureter, unspecified: Secondary | ICD-10-CM

## 2013-08-11 DIAGNOSIS — M25512 Pain in left shoulder: Secondary | ICD-10-CM

## 2013-08-11 DIAGNOSIS — I429 Cardiomyopathy, unspecified: Secondary | ICD-10-CM

## 2013-08-11 MED ORDER — HYDROCODONE-ACETAMINOPHEN 10-325 MG PO TABS: ORAL_TABLET | ORAL | Status: DC

## 2013-08-11 NOTE — Progress Notes (Signed)
Patient ID: [name] [name], female   DOB: Jan 06, 1925, 79 y.o.   MRN: [id number] This is a discharge notet.  Level of care skilled.  Facility [facility]   chief complaint  Discharge note  HPI--- is a very pleasant 79 year old female with a history of CHF chronic renal failure anemia hypothyroidism chronic left shoulder pain with history of rotator cuff tear gait abnormality.  .   she has done very well here  On initial admission....  She was hospitalized for CHF and came back to the hospital with increased hypoxia.  She was started on IV Lasix this was transitioned to by mouth also had renal issues with a creatinine that rose above 2 her Lasix was reduced and creatinine did come down to about 1.5.  She continues on Lasix 40 mg twice a day.-This has been stable her weight 7 stable.  About 6 weeks ago she did have an episode of hypoxia in fever-I don't really believe a clear etiology was found nonetheless she was treated with Rocephin and she also got a short increase in her Lasix secondary to an elevated BNP although it was not really that markedly elevated from her baseline.  She has been quite stable since then and again her weight has been stable she has no complaints of shortness of breath or fever today-  She also had a history of multiple falls before hospitalization however this has not really been an issue here to my knowledge-she seems to have adjusted well--been doing quite well with a rolling walker.   e  She does have a history of a chronic left shoulder rotator cuff tear--she is on Norco twice a day routine as well as when necessary every 6 hours and this appears to help She did have a lesion removed from her right arm this was apparently thought to be cancerous-precancerous-this is been followed by dermatology   -  Today she has no other acute complaints her vital signs continued to be stable-she will be going back to her home which she is very excited about-she does have supportive  friends and family but will be living by herself.   she will need a rolling walker as noted above she's doing quite well with this ambulating  .  Family medical social history has been reviewed per history and physical on 09/08/2012  .  Medications have been reviewed per MAR.   Review of systems.  In general she denies any fever chills or weight appears to be relatively stable.  Skin-denies issues or rash or itching. Status post skin lesion removal as noted above   Head ears eyes nose mouth and throat-does not complaining of any visual changes sore throat or nasal discharge.  Respiratory-no complaints of shortness of breath or cough.  Cardiac-no complaints of chest pain history CHF which has been stable during her stay here.  GI-does not complaining of any nausea vomiting diarrhea or constipation.  GU-denies any dysuria.  Muscle skeletal other than left shoulder pain has no complaints--appears Norco routinely is really helping with this .  Neurologic no complaints of headache or dizziness.  Psych-appears to be in good spirits does not complaining of depression or anxiety--although she does have a history of anxiety and is on Xanax which apparently is working quite well  .  Physical exam.   She is afebrile pulse of 82 respirations of 18 blood pressure taken manually 154/70 her weight is stable hovering around 113  In general this is a very pleasant somewhat frail elderly  female in no distress.  Her skin is warm and dry.   Eyes-pupils appear equal round reactive to light sclera and conjunctiva are clear.  Oropharynx is clear mucous membranes are moist.  Chest is clear to auscultation without any rhonchi rales or wheezes.  No labored breathing.  Heart-does have a 3/6 systolic murmur At baseline I do not really see any significant lower extremity edema.--- It is regular rate and rhythm  abdomen-soft nontender with positive bowel sounds.  Muscle skeletal moves all extremities x4 at  baseline she does have limited range of motion of her left shoulder --she does not well ambulating with a rolling walker l  Neurologic I do not see any focal deficit--her speech is clear-I do not see any lateralizing findings.  Labs.   07/01/2013.  Sodium 139 potassium 3.9 BUN 24 creatinine 1.36.  06/27/2013.  W. BC 5.7 hemoglobin 10.6 platelets 135.  02/20/2013.  Liver function tests within normal limits except bilirubin 0.2.  Of note 06/15/2013 TSH was 1.201 04/20/2013.  TSH-1.272.  03/23/2013.  Sodium 135 potassium 4.3 BUN 31 creatinine 1.39.  02/23/2013.  Liver function tests within normal limits except albumin of 3.3  01/12/2013.  T4-11 0.4-T3-57.9.  01/19/2013.  Sodium 137 potassium 3.6 BUN 24 creatinine 1.60.  01/14/2013.  WBC 6.8 hemoglobin 10.2 platelets 151.  11/17/2012.  Cholesterol 155 triglycerides 106 HDL 50 LDL 84.  [id number].  01/07/2013.  TSH 5.77 1.  Assessment and plan.  #1-CHF this appears to be quite stable and quite mild her weight continues to be stable no complaints of shortness of breath she does continue on Lasix--we'll update metabolic panel for discharge tomorrow  #2-history of chronic renal failure-Will update metabolic panel most recent lab appear to be at baseline W.  #3-history of tachybradycardia syndrome she is on the amiodarone as well as a beta blocker-this appears to be stable-she also continues on aspirin.--- is status post pacemaker implant  #4--hypertension-she does continue on Coreg 12.5 mg twice a day and is also on Lasix 40 mg twice a day we have limited blood pressure readings will order more of these twice a day with a log for review later in the week-would like to get more readings before discharge although I suspect unless there arer consistently high elevations we will defer to primary care provider-   #5 hyperlipidemia-her recent lipid panel this appears to be stable --   6-left shoulder pain with history rotator cuff  tear-deemed not a surgical candidate  --.  #7-hypothyroidism- On Synthroid TSH recently was normal  #8-history of gait ataxia-this has improved significantly with therapy there've been no recent falls to my knowledge.--Doing well with a rolling walker  #9-history of constipation as this has not been an issue recently she is on Colace.  #10-history of anxiety she is on Xanax routinely and also when necessary this appears to be effective she certainly appears calm and appropriate today which is her baseline  10-anemia secondary to B12 deficiency-this appears to be stable with recent B12 level-Will update a CBC. B12 level  .  #12-history of urinary retention-she continues on Flomax this has not been an issue recently and she does not complaining of dysuria.  At this point-- point patient appears to be quite stable ambulating well-she is very much looking forward to her discharge and finally going home  [id number]-of note greater than 30 minutes spent preparing this discharge summary   This encounter was created in error - please disregard.

## 2013-08-17 ENCOUNTER — Encounter: Payer: Self-pay | Admitting: Cardiology

## 2013-08-29 ENCOUNTER — Other Ambulatory Visit (HOSPITAL_BASED_OUTPATIENT_CLINIC_OR_DEPARTMENT_OTHER): Payer: Self-pay | Admitting: Internal Medicine

## 2013-09-05 ENCOUNTER — Other Ambulatory Visit (HOSPITAL_BASED_OUTPATIENT_CLINIC_OR_DEPARTMENT_OTHER): Payer: Self-pay | Admitting: Internal Medicine

## 2013-09-10 ENCOUNTER — Other Ambulatory Visit (HOSPITAL_BASED_OUTPATIENT_CLINIC_OR_DEPARTMENT_OTHER): Payer: Self-pay | Admitting: Internal Medicine

## 2013-09-13 ENCOUNTER — Other Ambulatory Visit (HOSPITAL_BASED_OUTPATIENT_CLINIC_OR_DEPARTMENT_OTHER): Payer: Self-pay | Admitting: Internal Medicine

## 2013-12-24 NOTE — Progress Notes (Signed)
This encounter was created in error - please disregard.

## 2014-01-24 ENCOUNTER — Inpatient Hospital Stay (HOSPITAL_COMMUNITY)
Admission: EM | Admit: 2014-01-24 | Discharge: 2014-01-29 | DRG: 871 | Disposition: A | Discharge status: Home Health | Payer: Medicare Other | Attending: Internal Medicine | Admitting: Internal Medicine

## 2014-01-24 ENCOUNTER — Emergency Department (HOSPITAL_COMMUNITY): Payer: Medicare Other

## 2014-01-24 ENCOUNTER — Encounter (HOSPITAL_COMMUNITY): Payer: Self-pay | Admitting: Emergency Medicine

## 2014-01-24 DIAGNOSIS — N289 Disorder of kidney and ureter, unspecified: Secondary | ICD-10-CM

## 2014-01-24 DIAGNOSIS — I4891 Unspecified atrial fibrillation: Secondary | ICD-10-CM

## 2014-01-24 DIAGNOSIS — J4489 Other specified chronic obstructive pulmonary disease: Secondary | ICD-10-CM | POA: Diagnosis present

## 2014-01-24 DIAGNOSIS — Z950 Presence of cardiac pacemaker: Secondary | ICD-10-CM

## 2014-01-24 DIAGNOSIS — R21 Rash and other nonspecific skin eruption: Secondary | ICD-10-CM

## 2014-01-24 DIAGNOSIS — G2581 Restless legs syndrome: Secondary | ICD-10-CM

## 2014-01-24 DIAGNOSIS — N183 Chronic kidney disease, stage 3 unspecified: Secondary | ICD-10-CM

## 2014-01-24 DIAGNOSIS — R7881 Bacteremia: Secondary | ICD-10-CM

## 2014-01-24 DIAGNOSIS — Z79899 Other long term (current) drug therapy: Secondary | ICD-10-CM

## 2014-01-24 DIAGNOSIS — K219 Gastro-esophageal reflux disease without esophagitis: Secondary | ICD-10-CM | POA: Diagnosis present

## 2014-01-24 DIAGNOSIS — R0902 Hypoxemia: Secondary | ICD-10-CM

## 2014-01-24 DIAGNOSIS — Z9861 Coronary angioplasty status: Secondary | ICD-10-CM

## 2014-01-24 DIAGNOSIS — E039 Hypothyroidism, unspecified: Secondary | ICD-10-CM

## 2014-01-24 DIAGNOSIS — Z951 Presence of aortocoronary bypass graft: Secondary | ICD-10-CM

## 2014-01-24 DIAGNOSIS — R6510 Systemic inflammatory response syndrome (SIRS) of non-infectious origin without acute organ dysfunction: Secondary | ICD-10-CM

## 2014-01-24 DIAGNOSIS — E785 Hyperlipidemia, unspecified: Secondary | ICD-10-CM

## 2014-01-24 DIAGNOSIS — I429 Cardiomyopathy, unspecified: Secondary | ICD-10-CM

## 2014-01-24 DIAGNOSIS — I208 Other forms of angina pectoris: Secondary | ICD-10-CM

## 2014-01-24 DIAGNOSIS — Z66 Do not resuscitate: Secondary | ICD-10-CM

## 2014-01-24 DIAGNOSIS — M1990 Unspecified osteoarthritis, unspecified site: Secondary | ICD-10-CM

## 2014-01-24 DIAGNOSIS — R7989 Other specified abnormal findings of blood chemistry: Secondary | ICD-10-CM

## 2014-01-24 DIAGNOSIS — R52 Pain, unspecified: Secondary | ICD-10-CM

## 2014-01-24 DIAGNOSIS — E119 Type 2 diabetes mellitus without complications: Secondary | ICD-10-CM

## 2014-01-24 DIAGNOSIS — R001 Bradycardia, unspecified: Secondary | ICD-10-CM

## 2014-01-24 DIAGNOSIS — J449 Chronic obstructive pulmonary disease, unspecified: Secondary | ICD-10-CM | POA: Diagnosis present

## 2014-01-24 DIAGNOSIS — N390 Urinary tract infection, site not specified: Secondary | ICD-10-CM

## 2014-01-24 DIAGNOSIS — R740 Nonspecific elevation of levels of transaminase and lactic acid dehydrogenase [LDH]: Secondary | ICD-10-CM

## 2014-01-24 DIAGNOSIS — I080 Rheumatic disorders of both mitral and aortic valves: Secondary | ICD-10-CM

## 2014-01-24 DIAGNOSIS — I5022 Chronic systolic (congestive) heart failure: Secondary | ICD-10-CM

## 2014-01-24 DIAGNOSIS — M109 Gout, unspecified: Secondary | ICD-10-CM | POA: Diagnosis present

## 2014-01-24 DIAGNOSIS — I495 Sick sinus syndrome: Secondary | ICD-10-CM

## 2014-01-24 DIAGNOSIS — Z22322 Carrier or suspected carrier of Methicillin resistant Staphylococcus aureus: Secondary | ICD-10-CM

## 2014-01-24 DIAGNOSIS — I2589 Other forms of chronic ischemic heart disease: Secondary | ICD-10-CM | POA: Diagnosis present

## 2014-01-24 DIAGNOSIS — A4151 Sepsis due to Escherichia coli [E. coli]: Principal | ICD-10-CM | POA: Diagnosis present

## 2014-01-24 DIAGNOSIS — I509 Heart failure, unspecified: Secondary | ICD-10-CM

## 2014-01-24 DIAGNOSIS — R1013 Epigastric pain: Secondary | ICD-10-CM | POA: Diagnosis not present

## 2014-01-24 DIAGNOSIS — N189 Chronic kidney disease, unspecified: Secondary | ICD-10-CM | POA: Diagnosis present

## 2014-01-24 DIAGNOSIS — I2089 Other forms of angina pectoris: Secondary | ICD-10-CM

## 2014-01-24 DIAGNOSIS — Z1612 Extended spectrum beta lactamase (ESBL) resistance: Secondary | ICD-10-CM

## 2014-01-24 DIAGNOSIS — I701 Atherosclerosis of renal artery: Secondary | ICD-10-CM

## 2014-01-24 DIAGNOSIS — I10 Essential (primary) hypertension: Secondary | ICD-10-CM

## 2014-01-24 DIAGNOSIS — A419 Sepsis, unspecified organism: Secondary | ICD-10-CM

## 2014-01-24 DIAGNOSIS — I5043 Acute on chronic combined systolic (congestive) and diastolic (congestive) heart failure: Secondary | ICD-10-CM

## 2014-01-24 DIAGNOSIS — R55 Syncope and collapse: Secondary | ICD-10-CM

## 2014-01-24 DIAGNOSIS — R945 Abnormal results of liver function studies: Secondary | ICD-10-CM

## 2014-01-24 DIAGNOSIS — R269 Unspecified abnormalities of gait and mobility: Secondary | ICD-10-CM

## 2014-01-24 DIAGNOSIS — F3289 Other specified depressive episodes: Secondary | ICD-10-CM | POA: Diagnosis present

## 2014-01-24 DIAGNOSIS — R109 Unspecified abdominal pain: Secondary | ICD-10-CM

## 2014-01-24 DIAGNOSIS — F419 Anxiety disorder, unspecified: Secondary | ICD-10-CM

## 2014-01-24 DIAGNOSIS — R339 Retention of urine, unspecified: Secondary | ICD-10-CM

## 2014-01-24 DIAGNOSIS — D519 Vitamin B12 deficiency anemia, unspecified: Secondary | ICD-10-CM

## 2014-01-24 DIAGNOSIS — R7401 Elevation of levels of liver transaminase levels: Secondary | ICD-10-CM

## 2014-01-24 DIAGNOSIS — R0609 Other forms of dyspnea: Secondary | ICD-10-CM

## 2014-01-24 DIAGNOSIS — D649 Anemia, unspecified: Secondary | ICD-10-CM

## 2014-01-24 DIAGNOSIS — F329 Major depressive disorder, single episode, unspecified: Secondary | ICD-10-CM | POA: Diagnosis present

## 2014-01-24 DIAGNOSIS — Z7982 Long term (current) use of aspirin: Secondary | ICD-10-CM

## 2014-01-24 DIAGNOSIS — I4892 Unspecified atrial flutter: Secondary | ICD-10-CM | POA: Diagnosis present

## 2014-01-24 DIAGNOSIS — A498 Other bacterial infections of unspecified site: Secondary | ICD-10-CM

## 2014-01-24 DIAGNOSIS — R509 Fever, unspecified: Secondary | ICD-10-CM

## 2014-01-24 DIAGNOSIS — I129 Hypertensive chronic kidney disease with stage 1 through stage 4 chronic kidney disease, or unspecified chronic kidney disease: Secondary | ICD-10-CM | POA: Diagnosis present

## 2014-01-24 DIAGNOSIS — K7200 Acute and subacute hepatic failure without coma: Secondary | ICD-10-CM | POA: Diagnosis present

## 2014-01-24 DIAGNOSIS — N939 Abnormal uterine and vaginal bleeding, unspecified: Secondary | ICD-10-CM

## 2014-01-24 DIAGNOSIS — N184 Chronic kidney disease, stage 4 (severe): Secondary | ICD-10-CM

## 2014-01-24 DIAGNOSIS — R079 Chest pain, unspecified: Secondary | ICD-10-CM

## 2014-01-24 DIAGNOSIS — I2510 Atherosclerotic heart disease of native coronary artery without angina pectoris: Secondary | ICD-10-CM

## 2014-01-24 DIAGNOSIS — I5023 Acute on chronic systolic (congestive) heart failure: Secondary | ICD-10-CM

## 2014-01-24 LAB — URINALYSIS, ROUTINE W REFLEX MICROSCOPIC
Bilirubin Urine: NEGATIVE
Glucose, UA: NEGATIVE mg/dL
Hgb urine dipstick: NEGATIVE
Ketones, ur: NEGATIVE mg/dL
Leukocytes, UA: NEGATIVE
Nitrite: NEGATIVE
Protein, ur: NEGATIVE mg/dL
Specific Gravity, Urine: 1.011 (ref 1.005–1.030)
UROBILINOGEN UA: 1 mg/dL (ref 0.0–1.0)
pH: 7.5 (ref 5.0–8.0)

## 2014-01-24 LAB — COMPREHENSIVE METABOLIC PANEL
ALBUMIN: 4.1 g/dL (ref 3.5–5.2)
ALT: 356 U/L — ABNORMAL HIGH (ref 0–35)
AST: 717 U/L — ABNORMAL HIGH (ref 0–37)
Alkaline Phosphatase: 265 U/L — ABNORMAL HIGH (ref 39–117)
BUN: 32 mg/dL — ABNORMAL HIGH (ref 6–23)
CALCIUM: 10.1 mg/dL (ref 8.4–10.5)
CO2: 25 mEq/L (ref 19–32)
CREATININE: 1.29 mg/dL — AB (ref 0.50–1.10)
Chloride: 94 mEq/L — ABNORMAL LOW (ref 96–112)
GFR calc Af Amer: 42 mL/min — ABNORMAL LOW (ref >90)
GFR calc non Af Amer: 36 mL/min — ABNORMAL LOW (ref >90)
GLUCOSE: 161 mg/dL — AB (ref 70–99)
POTASSIUM: 4 meq/L (ref 3.7–5.3)
Sodium: 136 mEq/L — ABNORMAL LOW (ref 137–147)
TOTAL PROTEIN: 8 g/dL (ref 6.0–8.3)
Total Bilirubin: 3.5 mg/dL — ABNORMAL HIGH (ref 0.3–1.2)

## 2014-01-24 LAB — I-STAT ARTERIAL BLOOD GAS, ED
ACID-BASE EXCESS: 2 mmol/L (ref 0.0–2.0)
BICARBONATE: 25.8 meq/L — AB (ref 20.0–24.0)
O2 SAT: 92 %
Patient temperature: 102.5
TCO2: 27 mmol/L (ref 0–100)
pCO2 arterial: 41.1 mmHg (ref 35.0–45.0)
pH, Arterial: 7.414 (ref 7.350–7.450)
pO2, Arterial: 71 mmHg — ABNORMAL LOW (ref 80.0–100.0)

## 2014-01-24 LAB — CBC WITH DIFFERENTIAL/PLATELET
BASOS PCT: 0 % (ref 0–1)
Basophils Absolute: 0 10*3/uL (ref 0.0–0.1)
EOS ABS: 0 10*3/uL (ref 0.0–0.7)
EOS PCT: 0 % (ref 0–5)
HCT: 38.4 % (ref 36.0–46.0)
HEMOGLOBIN: 12.6 g/dL (ref 12.0–15.0)
LYMPHS ABS: 0.3 10*3/uL — AB (ref 0.7–4.0)
Lymphocytes Relative: 4 % — ABNORMAL LOW (ref 12–46)
MCH: 30 pg (ref 26.0–34.0)
MCHC: 32.8 g/dL (ref 30.0–36.0)
MCV: 91.4 fL (ref 78.0–100.0)
MONO ABS: 0.6 10*3/uL (ref 0.1–1.0)
MONOS PCT: 7 % (ref 3–12)
NEUTROS PCT: 89 % — AB (ref 43–77)
Neutro Abs: 7.4 10*3/uL (ref 1.7–7.7)
Platelets: 171 10*3/uL (ref 150–400)
RBC: 4.2 MIL/uL (ref 3.87–5.11)
RDW: 14.9 % (ref 11.5–15.5)
WBC: 8.3 10*3/uL (ref 4.0–10.5)

## 2014-01-24 LAB — LACTIC ACID, PLASMA: Lactic Acid, Venous: 1.5 mmol/L (ref 0.5–2.2)

## 2014-01-24 LAB — I-STAT TROPONIN, ED: Troponin i, poc: 0.03 ng/mL (ref 0.00–0.08)

## 2014-01-24 LAB — LIPASE, BLOOD: LIPASE: 24 U/L (ref 11–59)

## 2014-01-24 LAB — I-STAT CG4 LACTIC ACID, ED: LACTIC ACID, VENOUS: 1.33 mmol/L (ref 0.5–2.2)

## 2014-01-24 MED ORDER — ACETAMINOPHEN 650 MG RE SUPP
650.0000 mg | Freq: Once | RECTAL | Status: AC
  Administered 2014-01-25: 650 mg via RECTAL
  Filled 2014-01-24: qty 1

## 2014-01-24 MED ORDER — PIPERACILLIN-TAZOBACTAM 3.375 G IVPB 30 MIN
3.3750 g | Freq: Once | INTRAVENOUS | Status: AC
  Administered 2014-01-24: 3.375 g via INTRAVENOUS
  Filled 2014-01-24: qty 50

## 2014-01-24 MED ORDER — VANCOMYCIN HCL 500 MG IV SOLR
500.0000 mg | INTRAVENOUS | Status: DC
  Filled 2014-01-24: qty 500

## 2014-01-24 MED ORDER — VANCOMYCIN HCL IN DEXTROSE 1-5 GM/200ML-% IV SOLN: 1000.0000 mg | Freq: Once | INTRAVENOUS | Status: DC

## 2014-01-24 MED ORDER — VANCOMYCIN HCL IN DEXTROSE 750-5 MG/150ML-% IV SOLN
750.0000 mg | Freq: Once | INTRAVENOUS | Status: AC
  Administered 2014-01-24: 750 mg via INTRAVENOUS
  Filled 2014-01-24: qty 150

## 2014-01-24 MED ORDER — ONDANSETRON HCL 4 MG/2ML IJ SOLN
4.0000 mg | Freq: Once | INTRAMUSCULAR | Status: AC
  Administered 2014-01-24: 4 mg via INTRAVENOUS
  Filled 2014-01-24: qty 2

## 2014-01-24 MED ORDER — FENTANYL CITRATE 0.05 MG/ML IJ SOLN
25.0000 ug | Freq: Once | INTRAMUSCULAR | Status: AC
  Administered 2014-01-24: 25 ug via INTRAVENOUS
  Filled 2014-01-24: qty 2

## 2014-01-24 MED ORDER — PIPERACILLIN-TAZOBACTAM 3.375 G IVPB
3.3750 g | Freq: Three times a day (TID) | INTRAVENOUS | Status: DC
  Administered 2014-01-25: 3.375 g via INTRAVENOUS
  Filled 2014-01-24 (×3): qty 50

## 2014-01-24 MED ORDER — SODIUM CHLORIDE 0.9 % IV SOLN
1000.0000 mL | INTRAVENOUS | Status: DC
  Administered 2014-01-24: 1000 mL via INTRAVENOUS

## 2014-01-24 MED ORDER — SODIUM CHLORIDE 0.9 % IV BOLUS (SEPSIS)
1000.0000 mL | Freq: Once | INTRAVENOUS | Status: AC
  Administered 2014-01-24: 1000 mL via INTRAVENOUS

## 2014-01-24 NOTE — Progress Notes (Signed)
ANTIBIOTIC CONSULT NOTE - INITIAL  Pharmacy Consult for Vancomycin / Zosyn Indication: rule out sepsis  Allergies  Allergen Reactions  . Altace [Ramipril] Hives    This is not a listed allergy on MAR provided by the [facility]  . Altace [Ramipril]     hives  . Contrast Media [Iodinated Diagnostic Agents] Other (See Comments)    Reaction noted by md-unknown reaction per family  . Contrast Media [Iodinated Diagnostic Agents]   . Heparin Other (See Comments)    Clots blood instead of thinning  . Heparin     Clots blood instead of thinning  . Hydromorphone     Crazy feeling  . Hydromorphone Hcl Other (See Comments)    Crazy feeling  . Plavix [Clopidogrel Bisulfate]   . Plavix [Clopidogrel]     Patient Measurements: Height: 5' 4.17" (163 cm) Weight: 116 lb 6.5 oz (52.8 kg) IBW/kg (Calculated) : 55.1   Vital Signs: Temp: 102.5 F (39.2 C) (06/14 2047) Temp src: Rectal (06/14 2047) BP: 160/82 mmHg (06/14 2036) Pulse Rate: 119 (06/14 2036) Intake/Output from previous day:   Intake/Output from this shift:    Labs: No results found for this basename: WBC, HGB, PLT, LABCREA, CREATININE,  in the last 72 hours Estimated Creatinine Clearance: 22.3 ml/min (by C-G formula based on Cr of 1.51). No results found for this basename: VANCOTROUGH, VANCOPEAK, VANCORANDOM, GENTTROUGH, GENTPEAK, GENTRANDOM, TOBRATROUGH, TOBRAPEAK, TOBRARND, AMIKACINPEAK, AMIKACINTROU, AMIKACIN,  in the last 72 hours   Microbiology: No results found for this or any previous visit (from the past 720 hour(s)).  Medical History: Past Medical History  Diagnosis Date  . Diabetes mellitus without complication   . Chest pain   . DJD (degenerative joint disease)   . Tachycardia-bradycardia syndrome   . CKD (chronic kidney disease)   . Cardiomegaly   . Coronary artery disease     a. LAD stent 1999 after NSTEMI. b. CABG 2004. c. Last LHC 02/2010 - patent grafts.  . Hypertension   . Hyperlipidemia   .  Carotid artery disease     status post right carotid endarterectomy.  . Degenerative joint disease   . Restless leg syndrome   . Chronic renal insufficiency   . Heparin induced thrombocytopenia   . Diphtheria     "as a child"  . Pleural effusion, bilateral 06/2003  . Pseudoaneurysm     status post repair following catherization in 1999  . Chronic systolic heart failure 10/02/11  . Ischemic cardiomyopathy     Echocardiogram 10/01/11: Mild LVH, EF 35-40%, basal inferior, basal to mid posterior and basal to mid anterolateral severe hypokinesis, mild to moderate AI, moderate MR (ischemic MR), moderate LAE, mild RVE, mildly reduced RV systolic function, mild RAE, PASP 43-44  . COPD (chronic obstructive pulmonary disease)   . Atrial fibrillation/flutter     s/p multiple DCCVs;  amiodarone started 09/2011, TEE DCCV 3/13;  amio and coumadin [facility]/c'd [facility] with frequent falls/syncope  . Tachycardia-bradycardia syndrome     s/p [employer] pacemaker implant 09/2011 (8 sec pause noted)  . GERD (gastroesophageal reflux disease)   . Renal artery stenosis     bilateral- status post stenting  . Non-functioning kidney   . Depression     "cries alot"  . Gout   . Hypothyroidism   . Anemia      Assessment: 88yof with recent CABG and SNF stay admitted with possible sepsis. Will begin broad spectrum ABX.  Baseline labs not drawn yet but  Cr 1.5 earlier this month.    Goal of Therapy:  Vancomycin trough level 15-20 mcg/ml  Plan:  Zosyn 3.375Gm IV x1 in ED then q8 EI Vancomycin 750mg  IV x1 then 500mg  IV q24   Leota Sauers Pharm.D. CPP, BCPS Clinical Pharmacist [phone] 01/24/2014 9:15 PM

## 2014-01-24 NOTE — ED Provider Notes (Addendum)
CSN: [id number]     Arrival date & time 01/24/14  2018 History   First MD Initiated Contact with Patient 01/24/14 2018     Chief Complaint  Patient presents with  . Weakness     (Consider location/radiation/quality/duration/timing/severity/associated sxs/prior Treatment) Patient is a 80 y.o. female presenting with abdominal pain. The history is provided by the patient. No language interpreter was used.  Abdominal Pain Pain location:  Epigastric Pain quality: aching   Pain radiates to:  Does not radiate Pain severity:  Severe Onset quality:  Unable to specify Duration:  8 hours Timing:  Constant Progression:  Unable to specify Chronicity:  New Relieved by:  Nothing Ineffective treatments:  None tried Associated symptoms: fatigue, fever, nausea and vomiting   Associated symptoms: no chest pain, no chills, no cough, no diarrhea, no dysuria, no shortness of breath and no sore throat     Past Medical History  Diagnosis Date  . Diabetes mellitus without complication   . Chest pain   . DJD (degenerative joint disease)   . Tachycardia-bradycardia syndrome   . CKD (chronic kidney disease)   . Cardiomegaly   . Coronary artery disease     a. LAD stent 1999 after NSTEMI. b. CABG 2004. c. Last LHC 02/2010 - patent grafts.  . Hypertension   . Hyperlipidemia   . Carotid artery disease     status post right carotid endarterectomy.  . Degenerative joint disease   . Restless leg syndrome   . Chronic renal insufficiency   . Heparin induced thrombocytopenia   . Diphtheria     "as a child"  . Pleural effusion, bilateral 06/2003  . Pseudoaneurysm     status post repair following catherization in 1999  . Chronic systolic heart failure 10/02/11  . Ischemic cardiomyopathy     Echocardiogram 10/01/11: Mild LVH, EF 35-40%, basal inferior, basal to mid posterior and basal to mid anterolateral severe hypokinesis, mild to moderate AI, moderate MR (ischemic MR), moderate LAE, mild RVE, mildly  reduced RV systolic function, mild RAE, PASP 43-44  . COPD (chronic obstructive pulmonary disease)   . Atrial fibrillation/flutter     s/p multiple DCCVs;  amiodarone started 09/2011, TEE DCCV 3/13;  amio and coumadin d/c'd after admxn to [facility] 02/2012 with frequent falls/syncope  . Tachycardia-bradycardia syndrome     s/p Medtronic pacemaker implant 09/2011 (8 sec pause noted)  . GERD (gastroesophageal reflux disease)   . Renal artery stenosis     bilateral- status post stenting  . Non-functioning kidney   . Depression     "cries alot"  . Gout   . Hypothyroidism   . Anemia    Past Surgical History  Procedure Laterality Date  . Pacemaker insertion    . Carotid endarterectomy  05/2003    right  . Pacemaker insertion  10/02/11    implanted by Dr Johney Frame  . Cholecystectomy  ?02/2011  . Tonsillectomy and adenoidectomy    . Renal artery stent  06/2003    bilaterally/e-chart  . Thoracentesis  ? 2000; 05/2011  . Coronary angioplasty with stent placement  1999  . Av fistula repair  01/1998    w/pseudoaneurysm repair S/P catheterization/E-chart  . Coronary artery bypass graft  05/2003    CABG X 3  . Tee without cardioversion  10/23/2011    Procedure: TRANSESOPHAGEAL ECHOCARDIOGRAM (TEE);  Surgeon: Wendall Stade, MD;  Location: [facility] ENDOSCOPY;  Service: Cardiovascular;  Laterality: N/A;  . Cardioversion  10/23/2011    Procedure: CARDIOVERSION;  Surgeon: Wendall Stade, MD;  Location: [facility] ENDOSCOPY;  Service: Cardiovascular;  Laterality: N/A;   Family History  Problem Relation Age of Onset  . Cancer Mother 59    died  . Lung disease Father 83    died  . Heart disease Sister     2 sisters and her son  . Anesthesia problems Neg Hx    History  Substance Use Topics  . Smoking status: Never Smoker   . Smokeless tobacco: Never Used  . Alcohol Use: No   OB History   Grav Para Term Preterm Abortions TAB SAB Ect Mult Living                 Review of Systems  Constitutional: Positive for  fever and fatigue. Negative for chills, diaphoresis, activity change and appetite change.  HENT: Negative for congestion, facial swelling, rhinorrhea and sore throat.   Eyes: Negative for photophobia and discharge.  Respiratory: Negative for cough, chest tightness and shortness of breath.   Cardiovascular: Negative for chest pain, palpitations and leg swelling.  Gastrointestinal: Positive for nausea, vomiting and abdominal pain. Negative for diarrhea.  Endocrine: Negative for polydipsia and polyuria.  Genitourinary: Negative for dysuria, frequency, difficulty urinating and pelvic pain.  Musculoskeletal: Negative for arthralgias, back pain, neck pain and neck stiffness.  Skin: Negative for color change and wound.  Allergic/Immunologic: Negative for immunocompromised state.  Neurological: Positive for weakness. Negative for facial asymmetry, numbness and headaches.  Hematological: Does not bruise/bleed easily.  Psychiatric/Behavioral: Positive for confusion. Negative for agitation.      Allergies  Heparin; Hydromorphone hcl; Warfarin and related; Altace; Contrast media; Morphine and related; and Plavix  Home Medications   Prior to Admission medications   Medication Sig Start Date End Date Taking? Authorizing Provider  amiodarone (PACERONE) 200 MG tablet Take 100 mg by mouth daily.   Yes Historical Provider, MD  aspirin EC 81 MG tablet Take 81 mg by mouth daily.   Yes Historical Provider, MD  atorvastatin (LIPITOR) 10 MG tablet Take 1 tablet (10 mg total) by mouth daily at 6 PM. 09/05/12  Yes Rhonda G Barrett, PA-C  carvedilol (COREG) 12.5 MG tablet Take 12.5 mg by mouth 2 (two) times daily with a meal.   Yes Historical Provider, MD  Cholecalciferol 1000 UNITS capsule Take 1,000 Units by mouth daily.   Yes Historical Provider, MD  cyanocobalamin (,VITAMIN B-12,) 1000 MCG/ML injection Inject 1,000 mcg into the muscle every 30 (thirty) days.   Yes Historical Provider, MD  docusate sodium  (COLACE) 100 MG capsule Take 200 mg by mouth 2 (two) times daily.    Yes Historical Provider, MD  furosemide (LASIX) 40 MG tablet Take 40 mg by mouth daily.   Yes Historical Provider, MD  HYDROcodone-acetaminophen (NORCO) 10-325 MG per tablet Take 1 tablet by mouth every 6 (six) hours.   Yes Historical Provider, MD  isosorbide mononitrate (IMDUR) 60 MG 24 hr tablet Take 1 tablet (60 mg total) by mouth daily. 11/27/12  Yes Rhonda G Barrett, PA-C  lactose free nutrition (BOOST) LIQD Take 237 mLs by mouth 3 (three) times daily between meals.   Yes Historical Provider, MD  lactulose (CHRONULAC) 10 GM/15ML solution Take 30 g by mouth daily as needed for moderate constipation.    Yes Historical Provider, MD  levothyroxine (SYNTHROID, LEVOTHROID) 88 MCG tablet Take 88 mcg by mouth daily before breakfast.   Yes Historical Provider, MD  omeprazole (PRILOSEC) 40 MG capsule Take 1 capsule (40 mg total)  by mouth daily. 11/27/12  Yes Rhonda G Barrett, PA-C  potassium chloride (K-DUR,KLOR-CON) 10 MEQ tablet Take 10 mEq by mouth daily.   Yes Historical Provider, MD  Tamsulosin HCl (FLOMAX) 0.4 MG CAPS Take 1 capsule (0.4 mg total) by mouth daily. 08/29/12  Yes Dayna N Dunn, PA-C  nitroGLYCERIN (NITROSTAT) 0.4 MG SL tablet Place 0.4 mg under the tongue every 5 (five) minutes as needed for chest pain.    Historical Provider, MD   BP 121/53  Pulse 76  Temp(Src) 97.4 F (36.3 C) (Oral)  Resp 18  Ht 5' 4.17" (1.63 m)  Wt 109 lb 12.6 oz (49.8 kg)  BMI 18.74 kg/m2  SpO2 100% Physical Exam  Constitutional: She is oriented to person, place, and time. She appears well-developed and well-nourished. She appears listless. She appears ill. She appears distressed.  HENT:  Head: Normocephalic and atraumatic.  Mouth/Throat: No oropharyngeal exudate.  Eyes: Pupils are equal, round, and reactive to light.  Neck: Normal range of motion. Neck supple.  Cardiovascular: Regular rhythm and normal heart sounds.  Tachycardia  present.  Exam reveals no gallop and no friction rub.   No murmur heard. Pulmonary/Chest: Effort normal and breath sounds normal. No respiratory distress. She has no wheezes. She has no rales.  Abdominal: Soft. Bowel sounds are normal. She exhibits no distension and no mass. There is tenderness in the epigastric area. There is guarding. There is no rebound.  Musculoskeletal: Normal range of motion. She exhibits no edema and no tenderness.  Neurological: She is oriented to person, place, and time. She appears listless.  Skin: Skin is warm and dry.  Psychiatric: She has a normal mood and affect.    ED Course  Procedures (including critical care time) Labs Review Labs Reviewed  CBC WITH DIFFERENTIAL - Abnormal; Notable for the following:    Neutrophils Relative % 89 (*)    Lymphocytes Relative 4 (*)    Lymphs Abs 0.3 (*)    All other components within normal limits  COMPREHENSIVE METABOLIC PANEL - Abnormal; Notable for the following:    Sodium 136 (*)    Chloride 94 (*)    Glucose, Bld 161 (*)    BUN 32 (*)    Creatinine, Ser 1.29 (*)    AST 717 (*)    ALT 356 (*)    Alkaline Phosphatase 265 (*)    Total Bilirubin 3.5 (*)    GFR calc non Af Amer 36 (*)    GFR calc Af Amer 42 (*)    All other components within normal limits  GLUCOSE, CAPILLARY - Abnormal; Notable for the following:    Glucose-Capillary 123 (*)    All other components within normal limits  GLUCOSE, CAPILLARY - Abnormal; Notable for the following:    Glucose-Capillary 101 (*)    All other components within normal limits  GLUCOSE, CAPILLARY - Abnormal; Notable for the following:    Glucose-Capillary 196 (*)    All other components within normal limits  COMPREHENSIVE METABOLIC PANEL - Abnormal; Notable for the following:    Glucose, Bld 105 (*)    Creatinine, Ser 1.30 (*)    Albumin 3.2 (*)    AST 226 (*)    ALT 262 (*)    Alkaline Phosphatase 172 (*)    Total Bilirubin 1.4 (*)    GFR calc non Af Amer 36 (*)     GFR calc Af Amer 42 (*)    All other components within normal limits  CBC -  Abnormal; Notable for the following:    RBC 3.33 (*)    Hemoglobin 9.9 (*)    HCT 30.7 (*)    Platelets 128 (*)    All other components within normal limits  PROTIME-INR - Abnormal; Notable for the following:    Prothrombin Time 15.9 (*)    All other components within normal limits  PRO B NATRIURETIC PEPTIDE - Abnormal; Notable for the following:    Pro B Natriuretic peptide (BNP) 9027.0 (*)    All other components within normal limits  I-STAT ARTERIAL BLOOD GAS, ED - Abnormal; Notable for the following:    pO2, Arterial 71.0 (*)    Bicarbonate 25.8 (*)    All other components within normal limits  URINE CULTURE  CULTURE, BLOOD (ROUTINE X 2)  CULTURE, BLOOD (ROUTINE X 2)  MRSA PCR SCREENING  LACTIC ACID, PLASMA  URINALYSIS, ROUTINE W REFLEX MICROSCOPIC  LIPASE, BLOOD  HEPATITIS PANEL, ACUTE  APTT  CMV (CYTOMEGALOVIRUS) DNA ULTRAQUANT, PCR  I-STAT CG4 LACTIC ACID, ED  I-STAT TROPOININ, ED    Imaging Review Ct Abdomen Pelvis Wo Contrast  01/24/2014   CLINICAL DATA:  Lethargy and weakness.  Epigastric abdominal pain.  EXAM: CT ABDOMEN AND PELVIS WITHOUT CONTRAST  TECHNIQUE: Multidetector CT imaging of the abdomen and pelvis was performed following the standard protocol without IV contrast.  COMPARISON:  Pelvic ultrasound performed 12/02/2012, and CT of the abdomen and pelvis performed 07/24/2010  FINDINGS: The visualized lung bases are clear. The patient is status post median sternotomy. Pacemaker leads are partially imaged. Scattered coronary artery calcifications are seen.  Prominence of the intrahepatic biliary ducts likely remains within normal limits status post cholecystectomy. Clips are noted along the gallbladder fossa. The liver and spleen are otherwise unremarkable in appearance. The pancreas and left adrenal gland are unremarkable. A 1.6 cm rounded density at the right adrenal gland is nonspecific  in appearance, but has increased only mildly in size from 2011, and is likely benign.  The kidneys are unremarkable in appearance. There is no evidence of hydronephrosis. Slight prominence of both ureters remains within normal limits. There is no evidence of distal obstruction. A calcification adjacent to the left vesicoureteral junction is stable from 2011 and likely vascular in nature. No renal or ureteral stones are seen. Mild nonspecific perinephric stranding is noted bilaterally. Bilateral extrarenal pelves are seen.  No free fluid is identified. The small bowel is unremarkable in appearance. The stomach is within normal limits. No acute vascular abnormalities are seen.  There is ectasia of the infrarenal abdominal aorta, measuring 2.9 cm in maximal transverse dimension and 2.3 cm in AP dimension. There is no evidence of aneurysmal dilatation. Diffuse calcification is seen along the abdominal aorta and its branches. Stents are noted within both proximal renal arteries, and there is relatively diffuse calcification along the superior mesenteric artery.  The appendix is not definitely seen; there is no evidence for appendicitis. Minimal diverticulosis is noted along the mid sigmoid colon, without evidence of diverticulitis. Dense stool is seen within the rectum.  The bladder is significantly distended and grossly unremarkable in appearance. The uterus is within normal limits, aside from scattered calcification. The ovaries are relatively symmetric. No suspicious adnexal masses are seen. No inguinal lymphadenopathy is seen. Postoperative change is noted at the right inguinal region.  No acute osseous abnormalities are identified. Vacuum phenomenon and disc space narrowing are seen at L4-L5.  IMPRESSION: 1. No definite acute abnormalities seen in the abdomen or pelvis. 2. Diffuse  calcification along the abdominal aorta and its branches, with relatively diffuse calcification involving the superior mesenteric artery.  Stents within both proximal renal arteries are grossly unremarkable in appearance. Ectasia of the infrarenal abdominal aorta, without evidence of aneurysmal dilatation. 3. Minimal diverticulosis along the mid sigmoid colon, without evidence of diverticulitis. 4. Scattered coronary artery calcifications seen. 5. 1.6 cm rounded density at the right adrenal gland is nonspecific, but is increased only mildly in size from 2011, and is likely benign.   Electronically Signed   By: Roanna Raider M.D.   On: 01/24/2014 22:10   US Abdomen Complete  01/25/2014   CLINICAL DATA:  Upper abdominal pain, elevated liver function tests. Status post cholecystectomy.  EXAM: ULTRASOUND ABDOMEN COMPLETE  COMPARISON:  CT of the abdomen and pelvis January 24, 2014 at 2152 hr  FINDINGS: Gallbladder:  Surgically absent.  Common bile duct:  Diameter: 8 mm, no sonographic findings of choledocholithiasis.  Liver:  Mildly heterogeneous echotexture may reflect fatty infiltration without intrahepatic biliary dilatation. No perihepatic free fluid. Hepatopetal portal vein.  IVC:  No abnormality visualized.  Pancreas:  Visualized portion unremarkable.  Spleen:  Size and appearance within normal limits.  Right Kidney:  Length: 8.5 cm. Echogenicity within normal limits. No mass or hydronephrosis visualized. Mild thinning of the renal cortex. Extra renal pelvis.  Left Kidney:  Length: 10.3 cm. Echogenicity within normal limits. 3.7 x 3.2 x 2.9 cm air anechoic cyst in upper pole left kidney as seen on prior CT. No hydronephrosis visualized. Mild thinning of the renal cortex.  Abdominal aorta: Limited assessment due to bowel gas and presumed calcific atherosclerosis ; ectatic aorta is seen on CT of abdomen and pelvis from same day, reported separately.  No aneurysm visualized.  Other findings:  None.  IMPRESSION: No acute abdominal process by sonography. Status post cholecystectomy.  Mild symmetric cortical renal atrophy.   Electronically Signed   By:  Awilda Metro   On: 01/25/2014 00:28   Dg Chest Portable 1 View  01/24/2014   CLINICAL DATA:  Weakness.  History of diabetes.  EXAM: PORTABLE CHEST - 1 VIEW  COMPARISON:  One-view chest 06/27/2013.  FINDINGS: The heart is enlarged. There is no edema or effusion to suggest failure. No focal airspace disease is present. Pacing wires are stable. Advanced degenerative changes in the shoulders is again noted.  IMPRESSION: 1. Cardiomegaly without failure. 2. No acute cardiopulmonary disease.   Electronically Signed   By: Gennette Pac M.D.   On: 01/24/2014 21:28     EKG Interpretation   Date/Time:  Sunday January 24 2014 20:36:31 EDT Ventricular Rate:  118 PR Interval:    QRS Duration: 101 QT Interval:  341 QTC Calculation: 478 R Axis:   75 Text Interpretation:  Junctional tachycardia LVH with secondary  repolarization abnormality ST depression, consider ischemia, diffuse lds  Confirmed by Maryela Tapper  MD, Maryum Batterson [phone]) on 01/24/2014 9:00:54 PM     CRITICAL CARE Performed by: Toy Cookey, E Total critical care time: 30 Critical care time was exclusive of separately billable procedures and treating other patients. Critical care was necessary to treat or prevent imminent or life-threatening deterioration. Critical care was time spent personally by me on the following activities: development of treatment plan with patient and/or surrogate as well as nursing, discussions with consultants, evaluation of patient's response to treatment, examination of patient, obtaining history from patient or surrogate, ordering and performing treatments and interventions, ordering and review of laboratory studies, ordering and review of radiographic studies,  pulse oximetry and re-evaluation of patient's condition.  MDM   Final diagnoses:  Abdominal pain  SIRS (systemic inflammatory response syndrome)  Elevated LFTs  Fever, unspecified    Pt is a 80 y.o. female with Pmhx as above who presents with several  hours of epigastric pain, nausea, lethargy.  On arrival pt is ill appearing, febrile, tachycardic w/ epigastric pain with guarding.  Her lethargy has improved per EMS, and she is GCS 15. No rebound. Level II code sepsis initiated, vanc/zosyn started. W/U with LFT elevations, Tbili elevation, but no acute findings of CT ab/pelvis or RUQ [place]. UA clean, no CXR findings. Given unexplained fever & LFT abnormalities, DDx includes acute hepatis, ascending cholangitis. Hepatitis panel sent. [facility] consulted for admission, will go to step down. GI also consulted, will see pt in AM.        Shanna Cisco, MD 01/26/14 1332  Shanna Cisco, MD 02/04/14 [phone]

## 2014-01-24 NOTE — ED Notes (Signed)
MD at bedside. 

## 2014-01-24 NOTE — ED Notes (Signed)
I Stat Lactic Acid results shown to Dr. Nonda Lou

## 2014-01-24 NOTE — ED Notes (Signed)
Patient transported to Ultrasound 

## 2014-01-24 NOTE — ED Notes (Signed)
Per EMS family states pt acting Lethargic and weakness; c/o epigastric pain for about 2 hours; Pt hx of CABG; Pt just released from nursing home a couple of days ago; hs crushed right shoulder; No family at bedside on arrival; pt stats she lives alone

## 2014-01-24 NOTE — ED Notes (Signed)
Phlebotomy at bedside.

## 2014-01-25 DIAGNOSIS — I5043 Acute on chronic combined systolic (congestive) and diastolic (congestive) heart failure: Secondary | ICD-10-CM | POA: Diagnosis not present

## 2014-01-25 DIAGNOSIS — I129 Hypertensive chronic kidney disease with stage 1 through stage 4 chronic kidney disease, or unspecified chronic kidney disease: Secondary | ICD-10-CM | POA: Diagnosis present

## 2014-01-25 DIAGNOSIS — I4892 Unspecified atrial flutter: Secondary | ICD-10-CM | POA: Diagnosis present

## 2014-01-25 DIAGNOSIS — Z7982 Long term (current) use of aspirin: Secondary | ICD-10-CM | POA: Diagnosis not present

## 2014-01-25 DIAGNOSIS — E039 Hypothyroidism, unspecified: Secondary | ICD-10-CM | POA: Diagnosis present

## 2014-01-25 DIAGNOSIS — R1013 Epigastric pain: Secondary | ICD-10-CM | POA: Diagnosis present

## 2014-01-25 DIAGNOSIS — R109 Unspecified abdominal pain: Secondary | ICD-10-CM | POA: Diagnosis present

## 2014-01-25 DIAGNOSIS — N189 Chronic kidney disease, unspecified: Secondary | ICD-10-CM | POA: Diagnosis present

## 2014-01-25 DIAGNOSIS — J449 Chronic obstructive pulmonary disease, unspecified: Secondary | ICD-10-CM | POA: Diagnosis present

## 2014-01-25 DIAGNOSIS — Z951 Presence of aortocoronary bypass graft: Secondary | ICD-10-CM | POA: Diagnosis not present

## 2014-01-25 DIAGNOSIS — E119 Type 2 diabetes mellitus without complications: Secondary | ICD-10-CM | POA: Diagnosis present

## 2014-01-25 DIAGNOSIS — R6510 Systemic inflammatory response syndrome (SIRS) of non-infectious origin without acute organ dysfunction: Secondary | ICD-10-CM

## 2014-01-25 DIAGNOSIS — N390 Urinary tract infection, site not specified: Secondary | ICD-10-CM | POA: Diagnosis present

## 2014-01-25 DIAGNOSIS — K7200 Acute and subacute hepatic failure without coma: Secondary | ICD-10-CM | POA: Diagnosis present

## 2014-01-25 DIAGNOSIS — E785 Hyperlipidemia, unspecified: Secondary | ICD-10-CM | POA: Diagnosis present

## 2014-01-25 DIAGNOSIS — Z22322 Carrier or suspected carrier of Methicillin resistant Staphylococcus aureus: Secondary | ICD-10-CM | POA: Diagnosis not present

## 2014-01-25 DIAGNOSIS — I2510 Atherosclerotic heart disease of native coronary artery without angina pectoris: Secondary | ICD-10-CM | POA: Diagnosis present

## 2014-01-25 DIAGNOSIS — F329 Major depressive disorder, single episode, unspecified: Secondary | ICD-10-CM | POA: Diagnosis present

## 2014-01-25 DIAGNOSIS — Z9861 Coronary angioplasty status: Secondary | ICD-10-CM | POA: Diagnosis not present

## 2014-01-25 DIAGNOSIS — R17 Unspecified jaundice: Secondary | ICD-10-CM

## 2014-01-25 DIAGNOSIS — I2589 Other forms of chronic ischemic heart disease: Secondary | ICD-10-CM | POA: Diagnosis present

## 2014-01-25 DIAGNOSIS — R740 Nonspecific elevation of levels of transaminase and lactic acid dehydrogenase [LDH]: Secondary | ICD-10-CM

## 2014-01-25 DIAGNOSIS — Z66 Do not resuscitate: Secondary | ICD-10-CM | POA: Diagnosis present

## 2014-01-25 DIAGNOSIS — Z950 Presence of cardiac pacemaker: Secondary | ICD-10-CM | POA: Diagnosis not present

## 2014-01-25 DIAGNOSIS — F3289 Other specified depressive episodes: Secondary | ICD-10-CM | POA: Diagnosis present

## 2014-01-25 DIAGNOSIS — M109 Gout, unspecified: Secondary | ICD-10-CM | POA: Diagnosis present

## 2014-01-25 DIAGNOSIS — A4151 Sepsis due to Escherichia coli [E. coli]: Secondary | ICD-10-CM | POA: Diagnosis present

## 2014-01-25 DIAGNOSIS — I509 Heart failure, unspecified: Secondary | ICD-10-CM | POA: Diagnosis present

## 2014-01-25 DIAGNOSIS — I4891 Unspecified atrial fibrillation: Secondary | ICD-10-CM

## 2014-01-25 DIAGNOSIS — A419 Sepsis, unspecified organism: Secondary | ICD-10-CM | POA: Diagnosis present

## 2014-01-25 DIAGNOSIS — R7989 Other specified abnormal findings of blood chemistry: Secondary | ICD-10-CM

## 2014-01-25 DIAGNOSIS — Z79899 Other long term (current) drug therapy: Secondary | ICD-10-CM | POA: Diagnosis not present

## 2014-01-25 DIAGNOSIS — K219 Gastro-esophageal reflux disease without esophagitis: Secondary | ICD-10-CM | POA: Diagnosis present

## 2014-01-25 DIAGNOSIS — R7401 Elevation of levels of liver transaminase levels: Secondary | ICD-10-CM | POA: Diagnosis present

## 2014-01-25 LAB — URINE CULTURE

## 2014-01-25 LAB — HEPATITIS PANEL, ACUTE
HCV AB: NEGATIVE
HEP B C IGM: NONREACTIVE
HEP B S AG: NEGATIVE
Hep A IgM: NONREACTIVE

## 2014-01-25 LAB — GLUCOSE, CAPILLARY
GLUCOSE-CAPILLARY: 123 mg/dL — AB (ref 70–99)
GLUCOSE-CAPILLARY: 101 mg/dL — AB (ref 70–99)
Glucose-Capillary: 196 mg/dL — ABNORMAL HIGH (ref 70–99)

## 2014-01-25 LAB — MRSA PCR SCREENING: MRSA BY PCR: NEGATIVE

## 2014-01-25 MED ORDER — BIOTENE DRY MOUTH MT LIQD
15.0000 mL | Freq: Two times a day (BID) | OROMUCOSAL | Status: DC
  Administered 2014-01-25 – 2014-01-29 (×9): 15 mL via OROMUCOSAL

## 2014-01-25 MED ORDER — HYDROCODONE-ACETAMINOPHEN 10-325 MG PO TABS
1.0000 | ORAL_TABLET | Freq: Four times a day (QID) | ORAL | Status: DC
  Administered 2014-01-25: 1 via ORAL
  Filled 2014-01-25: qty 1

## 2014-01-25 MED ORDER — CARVEDILOL 12.5 MG PO TABS
12.5000 mg | ORAL_TABLET | Freq: Two times a day (BID) | ORAL | Status: DC
  Administered 2014-01-25 – 2014-01-29 (×9): 12.5 mg via ORAL
  Filled 2014-01-25 (×12): qty 1

## 2014-01-25 MED ORDER — ACETAMINOPHEN 500 MG PO TABS: 500.0000 mg | ORAL_TABLET | Freq: Four times a day (QID) | ORAL | Status: DC | PRN

## 2014-01-25 MED ORDER — ISOSORBIDE MONONITRATE ER 60 MG PO TB24
60.0000 mg | ORAL_TABLET | Freq: Every day | ORAL | Status: DC
  Administered 2014-01-25 – 2014-01-29 (×5): 60 mg via ORAL
  Filled 2014-01-25 (×5): qty 1

## 2014-01-25 MED ORDER — ASPIRIN EC 81 MG PO TBEC
81.0000 mg | DELAYED_RELEASE_TABLET | Freq: Every day | ORAL | Status: DC
  Administered 2014-01-25 – 2014-01-29 (×5): 81 mg via ORAL
  Filled 2014-01-25 (×5): qty 1

## 2014-01-25 MED ORDER — BOOST PO LIQD
237.0000 mL | Freq: Three times a day (TID) | ORAL | Status: DC
  Administered 2014-01-25 – 2014-01-27 (×5): 237 mL via ORAL
  Filled 2014-01-25 (×11): qty 237

## 2014-01-25 MED ORDER — PANTOPRAZOLE SODIUM 40 MG PO TBEC
80.0000 mg | DELAYED_RELEASE_TABLET | Freq: Every day | ORAL | Status: DC
  Administered 2014-01-25 – 2014-01-29 (×5): 80 mg via ORAL
  Filled 2014-01-25 (×4): qty 2

## 2014-01-25 MED ORDER — SODIUM CHLORIDE 0.9 % IJ SOLN
3.0000 mL | Freq: Two times a day (BID) | INTRAMUSCULAR | Status: DC
  Administered 2014-01-25 – 2014-01-28 (×7): 3 mL via INTRAVENOUS

## 2014-01-25 MED ORDER — TAMSULOSIN HCL 0.4 MG PO CAPS
0.4000 mg | ORAL_CAPSULE | Freq: Every day | ORAL | Status: DC
  Administered 2014-01-25 – 2014-01-29 (×5): 0.4 mg via ORAL
  Filled 2014-01-25 (×5): qty 1

## 2014-01-25 MED ORDER — DOCUSATE SODIUM 100 MG PO CAPS
200.0000 mg | ORAL_CAPSULE | Freq: Two times a day (BID) | ORAL | Status: DC
  Administered 2014-01-25 – 2014-01-29 (×9): 200 mg via ORAL
  Filled 2014-01-25 (×12): qty 2

## 2014-01-25 MED ORDER — POTASSIUM CHLORIDE CRYS ER 10 MEQ PO TBCR
10.0000 meq | EXTENDED_RELEASE_TABLET | Freq: Every day | ORAL | Status: DC
  Administered 2014-01-25 – 2014-01-29 (×5): 10 meq via ORAL
  Filled 2014-01-25 (×5): qty 1

## 2014-01-25 MED ORDER — LEVOTHYROXINE SODIUM 88 MCG PO TABS
88.0000 ug | ORAL_TABLET | Freq: Every day | ORAL | Status: DC
  Administered 2014-01-25 – 2014-01-27 (×3): 88 ug via ORAL
  Filled 2014-01-25 (×5): qty 1

## 2014-01-25 MED ORDER — SODIUM CHLORIDE 0.9 % IV SOLN: INTRAVENOUS | Status: AC

## 2014-01-25 MED ORDER — FUROSEMIDE 40 MG PO TABS
40.0000 mg | ORAL_TABLET | Freq: Every day | ORAL | Status: DC
  Administered 2014-01-25 – 2014-01-26 (×2): 40 mg via ORAL
  Filled 2014-01-25 (×2): qty 1

## 2014-01-25 MED ORDER — HYDROCODONE-ACETAMINOPHEN 10-325 MG PO TABS
1.0000 | ORAL_TABLET | Freq: Four times a day (QID) | ORAL | Status: DC
  Administered 2014-01-25 – 2014-01-29 (×16): 1 via ORAL
  Filled 2014-01-25 (×17): qty 1

## 2014-01-25 MED ORDER — ATORVASTATIN CALCIUM 10 MG PO TABS
10.0000 mg | ORAL_TABLET | Freq: Every day | ORAL | Status: DC
  Administered 2014-01-25 – 2014-01-28 (×4): 10 mg via ORAL
  Filled 2014-01-25 (×5): qty 1

## 2014-01-25 MED ORDER — SODIUM CHLORIDE 0.9 % IV SOLN
INTRAVENOUS | Status: DC
Start: 2014-01-25 — End: 2014-01-25

## 2014-01-25 MED ORDER — LACTULOSE 10 GM/15ML PO SOLN
30.0000 g | Freq: Every day | ORAL | Status: DC | PRN
  Filled 2014-01-25: qty 45

## 2014-01-25 NOTE — H&P (Addendum)
[facility] History and Physical  [name] [name] FGH:[id number] DOB: [birth year] DOA: 01/24/2014  Referring physician: EDP PCP: Sherrie Mustache, MD   Chief Complaint: Abdominal pain   HPI: [name] [name] is a 80 y.o. female who presents to the ED with abdominal pain and generalized weakness.  Abdominal pain is located in her epigastric area, aching, no radiation, severe, and onset 8 hours ago.  It has been constant since onset.  She has not tried anything before coming to the ED for symptoms.  She has a history of cholecystectomy in the past.  She has also had nausea and vomiting associated with this.  Review of Systems: Systems reviewed.  As above, otherwise negative  Past Medical History  Diagnosis Date  . Diabetes mellitus without complication   . Chest pain   . DJD (degenerative joint disease)   . Tachycardia-bradycardia syndrome   . CKD (chronic kidney disease)   . Cardiomegaly   . Coronary artery disease     a. LAD stent 1999 after NSTEMI. b. CABG 2004. c. Last LHC 02/2010 - patent grafts.  . Hypertension   . Hyperlipidemia   . Carotid artery disease     status post right carotid endarterectomy.  . Degenerative joint disease   . Restless leg syndrome   . Chronic renal insufficiency   . Heparin induced thrombocytopenia   . Diphtheria     "as a child"  . Pleural effusion, bilateral 06/2003  . Pseudoaneurysm     status post repair following catherization in 1999  . Chronic systolic heart failure 6/78/93  . Ischemic cardiomyopathy     Echocardiogram 10/01/11: Mild LVH, EF 35-40%, basal inferior, basal to mid posterior and basal to mid anterolateral severe hypokinesis, mild to moderate AI, moderate MR (ischemic MR), moderate LAE, mild RVE, mildly reduced RV systolic function, mild RAE, PASP 43-44  . COPD (chronic obstructive pulmonary disease)   . Atrial fibrillation/flutter     s/p multiple DCCVs;  amiodarone started 09/2011, TEE DCCV 3/13;  amio and coumadin  d/c'd after admxn to [facility] 02/2012 with frequent falls/syncope  . Tachycardia-bradycardia syndrome     s/p Medtronic pacemaker implant 09/2011 (8 sec pause noted)  . GERD (gastroesophageal reflux disease)   . Renal artery stenosis     bilateral- status post stenting  . Non-functioning kidney   . Depression     "cries alot"  . Gout   . Hypothyroidism   . Anemia    Past Surgical History  Procedure Laterality Date  . Pacemaker insertion    . Carotid endarterectomy  05/2003    right  . Pacemaker insertion  10/02/11    implanted by Dr Rayann Heman  . Cholecystectomy  ?02/2011  . Tonsillectomy and adenoidectomy    . Renal artery stent  06/2003    bilaterally/e-chart  . Thoracentesis  ? 2000; 05/2011  . Coronary angioplasty with stent placement  1999  . Av fistula repair  01/1998    w/pseudoaneurysm repair S/P catheterization/E-chart  . Coronary artery bypass graft  05/2003    CABG X 3  . Tee without cardioversion  10/23/2011    Procedure: TRANSESOPHAGEAL ECHOCARDIOGRAM (TEE);  Surgeon: Josue Hector, MD;  Location: [facility] ENDOSCOPY;  Service: Cardiovascular;  Laterality: N/A;  . Cardioversion  10/23/2011    Procedure: CARDIOVERSION;  Surgeon: Josue Hector, MD;  Location: [facility] ENDOSCOPY;  Service: Cardiovascular;  Laterality: N/A;   Social History:  reports that she has never smoked. She has never used smokeless tobacco. She  reports that she does not drink alcohol or use illicit drugs.  Allergies  Allergen Reactions  . Heparin Other (See Comments)    Clots blood instead of thinning  . Hydromorphone Hcl Other (See Comments)    Crazy feeling  . Warfarin And Related Other (See Comments)    Syncope events  . Altace [Ramipril] Hives    This is not a listed allergy on MAR provided by the [facility]  . Contrast Media [Iodinated Diagnostic Agents] Other (See Comments)    Reaction noted by md-unknown reaction per family  . Morphine And Related Nausea And Vomiting  . Plavix [Clopidogrel Bisulfate]  Other (See Comments)    unknown    Family History  Problem Relation Age of Onset  . Cancer Mother 17    died  . Lung disease Father 46    died  . Heart disease Sister     2 sisters and her son  . Anesthesia problems Neg Hx      Prior to Admission medications   Medication Sig Start Date End Date Taking? Authorizing Provider  amiodarone (PACERONE) 200 MG tablet Take 100 mg by mouth daily.   Yes Historical Provider, MD  aspirin EC 81 MG tablet Take 81 mg by mouth daily.   Yes Historical Provider, MD  atorvastatin (LIPITOR) 10 MG tablet Take 1 tablet (10 mg total) by mouth daily at 6 PM. 09/05/12  Yes Rhonda G Barrett, PA-C  carvedilol (COREG) 12.5 MG tablet Take 12.5 mg by mouth 2 (two) times daily with a meal.   Yes Historical Provider, MD  Cholecalciferol 1000 UNITS capsule Take 1,000 Units by mouth daily.   Yes Historical Provider, MD  cyanocobalamin (,VITAMIN B-12,) 1000 MCG/ML injection Inject 1,000 mcg into the muscle every 30 (thirty) days.   Yes Historical Provider, MD  docusate sodium (COLACE) 100 MG capsule Take 200 mg by mouth 2 (two) times daily.    Yes Historical Provider, MD  furosemide (LASIX) 40 MG tablet Take 40 mg by mouth daily.   Yes Historical Provider, MD  HYDROcodone-acetaminophen (NORCO) 10-325 MG per tablet Take 1 tablet by mouth every 6 (six) hours.   Yes Historical Provider, MD  isosorbide mononitrate (IMDUR) 60 MG 24 hr tablet Take 1 tablet (60 mg total) by mouth daily. 11/27/12  Yes Rhonda G Barrett, PA-C  lactose free nutrition (BOOST) LIQD Take 237 mLs by mouth 3 (three) times daily between meals.   Yes Historical Provider, MD  lactulose (CHRONULAC) 10 GM/15ML solution Take 30 g by mouth daily as needed for moderate constipation.    Yes Historical Provider, MD  levothyroxine (SYNTHROID, LEVOTHROID) 88 MCG tablet Take 88 mcg by mouth daily before breakfast.   Yes Historical Provider, MD  omeprazole (PRILOSEC) 40 MG capsule Take 1 capsule (40 mg total) by mouth  daily. 11/27/12  Yes Rhonda G Barrett, PA-C  potassium chloride (K-DUR,KLOR-CON) 10 MEQ tablet Take 10 mEq by mouth daily.   Yes Historical Provider, MD  Tamsulosin HCl (FLOMAX) 0.4 MG CAPS Take 1 capsule (0.4 mg total) by mouth daily. 08/29/12  Yes Dayna N Dunn, PA-C  nitroGLYCERIN (NITROSTAT) 0.4 MG SL tablet Place 0.4 mg under the tongue every 5 (five) minutes as needed for chest pain.    Historical Provider, MD   Physical Exam: Filed Vitals:   01/25/14 0100  BP: 127/46  Pulse: 84  Temp:   Resp: 26    BP 127/46  Pulse 84  Temp(Src) 102.5 F (39.2 C) (Rectal)  Resp  26  Ht 5' 4.17" (1.63 m)  Wt 52.8 kg (116 lb 6.5 oz)  BMI 19.87 kg/m2  SpO2 94%  General Appearance:    Alert, oriented, no distress, appears stated age  Head:    Normocephalic, atraumatic  Eyes:    PERRL, EOMI, sclera non-icteric        Nose:   Nares without drainage or epistaxis. Mucosa, turbinates normal  Throat:   Moist mucous membranes. Oropharynx without erythema or exudate.  Neck:   Supple. No carotid bruits.  No thyromegaly.  No lymphadenopathy.   Back:     No CVA tenderness, no spinal tenderness  Lungs:     Clear to auscultation bilaterally, without wheezes, rhonchi or rales  Chest wall:    No tenderness to palpitation  Heart:    Regular rate and rhythm without murmurs, gallops, rubs  Abdomen:     Soft, epigastric tenderness without rebound, nondistended, normal bowel sounds, no organomegaly  Genitalia:    deferred  Rectal:    deferred  Extremities:   No clubbing, cyanosis or edema.  Pulses:   2+ and symmetric all extremities  Skin:   Skin color, texture, turgor normal, no rashes or lesions  Lymph nodes:   Cervical, supraclavicular, and axillary nodes normal  Neurologic:   CNII-XII intact. Normal strength, sensation and reflexes      throughout    Labs on Admission:  Basic Metabolic Panel:  Recent Labs Lab 01/24/14 2054  NA 136*  K 4.0  CL 94*  CO2 25  GLUCOSE 161*  BUN 32*  CREATININE  1.29*  CALCIUM 10.1   Liver Function Tests:  Recent Labs Lab 01/24/14 2054  AST 717*  ALT 356*  ALKPHOS 265*  BILITOT 3.5*  PROT 8.0  ALBUMIN 4.1    Recent Labs Lab 01/24/14 2054  LIPASE 24   No results found for this basename: AMMONIA,  in the last 168 hours CBC:  Recent Labs Lab 01/24/14 2054  WBC 8.3  NEUTROABS 7.4  HGB 12.6  HCT 38.4  MCV 91.4  PLT 171   Cardiac Enzymes: No results found for this basename: CKTOTAL, CKMB, CKMBINDEX, TROPONINI,  in the last 168 hours  BNP (last 3 results) No results found for this basename: PROBNP,  in the last 8760 hours CBG: No results found for this basename: GLUCAP,  in the last 168 hours  Radiological Exams on Admission: Ct Abdomen Pelvis Wo Contrast  01/24/2014   CLINICAL DATA:  Lethargy and weakness.  Epigastric abdominal pain.  EXAM: CT ABDOMEN AND PELVIS WITHOUT CONTRAST  TECHNIQUE: Multidetector CT imaging of the abdomen and pelvis was performed following the standard protocol without IV contrast.  COMPARISON:  Pelvic ultrasound performed 12/02/2012, and CT of the abdomen and pelvis performed 07/24/2010  FINDINGS: The visualized lung bases are clear. The patient is status post median sternotomy. Pacemaker leads are partially imaged. Scattered coronary artery calcifications are seen.  Prominence of the intrahepatic biliary ducts likely remains within normal limits status post cholecystectomy. Clips are noted along the gallbladder fossa. The liver and spleen are otherwise unremarkable in appearance. The pancreas and left adrenal gland are unremarkable. A 1.6 cm rounded density at the right adrenal gland is nonspecific in appearance, but has increased only mildly in size from 2011, and is likely benign.  The kidneys are unremarkable in appearance. There is no evidence of hydronephrosis. Slight prominence of both ureters remains within normal limits. There is no evidence of distal obstruction. A calcification adjacent to the  left  vesicoureteral junction is stable from 2011 and likely vascular in nature. No renal or ureteral stones are seen. Mild nonspecific perinephric stranding is noted bilaterally. Bilateral extrarenal pelves are seen.  No free fluid is identified. The small bowel is unremarkable in appearance. The stomach is within normal limits. No acute vascular abnormalities are seen.  There is ectasia of the infrarenal abdominal aorta, measuring 2.9 cm in maximal transverse dimension and 2.3 cm in AP dimension. There is no evidence of aneurysmal dilatation. Diffuse calcification is seen along the abdominal aorta and its branches. Stents are noted within both proximal renal arteries, and there is relatively diffuse calcification along the superior mesenteric artery.  The appendix is not definitely seen; there is no evidence for appendicitis. Minimal diverticulosis is noted along the mid sigmoid colon, without evidence of diverticulitis. Dense stool is seen within the rectum.  The bladder is significantly distended and grossly unremarkable in appearance. The uterus is within normal limits, aside from scattered calcification. The ovaries are relatively symmetric. No suspicious adnexal masses are seen. No inguinal lymphadenopathy is seen. Postoperative change is noted at the right inguinal region.  No acute osseous abnormalities are identified. Vacuum phenomenon and disc space narrowing are seen at L4-L5.  IMPRESSION: 1. No definite acute abnormalities seen in the abdomen or pelvis. 2. Diffuse calcification along the abdominal aorta and its branches, with relatively diffuse calcification involving the superior mesenteric artery. Stents within both proximal renal arteries are grossly unremarkable in appearance. Ectasia of the infrarenal abdominal aorta, without evidence of aneurysmal dilatation. 3. Minimal diverticulosis along the mid sigmoid colon, without evidence of diverticulitis. 4. Scattered coronary artery calcifications seen. 5.  1.6 cm rounded density at the right adrenal gland is nonspecific, but is increased only mildly in size from 2011, and is likely benign.   Electronically Signed   By: Garald Balding M.D.   On: 01/24/2014 22:10   US Abdomen Complete  01/25/2014   CLINICAL DATA:  Upper abdominal pain, elevated liver function tests. Status post cholecystectomy.  EXAM: ULTRASOUND ABDOMEN COMPLETE  COMPARISON:  CT of the abdomen and pelvis January 24, 2014 at 2152 hr  FINDINGS: Gallbladder:  Surgically absent.  Common bile duct:  Diameter: 8 mm, no sonographic findings of choledocholithiasis.  Liver:  Mildly heterogeneous echotexture may reflect fatty infiltration without intrahepatic biliary dilatation. No perihepatic free fluid. Hepatopetal portal vein.  IVC:  No abnormality visualized.  Pancreas:  Visualized portion unremarkable.  Spleen:  Size and appearance within normal limits.  Right Kidney:  Length: 8.5 cm. Echogenicity within normal limits. No mass or hydronephrosis visualized. Mild thinning of the renal cortex. Extra renal pelvis.  Left Kidney:  Length: 10.3 cm. Echogenicity within normal limits. 3.7 x 3.2 x 2.9 cm air anechoic cyst in upper pole left kidney as seen on prior CT. No hydronephrosis visualized. Mild thinning of the renal cortex.  Abdominal aorta: Limited assessment due to bowel gas and presumed calcific atherosclerosis ; ectatic aorta is seen on CT of abdomen and pelvis from same day, reported separately.  No aneurysm visualized.  Other findings:  None.  IMPRESSION: No acute abdominal process by sonography. Status post cholecystectomy.  Mild symmetric cortical renal atrophy.   Electronically Signed   By: Elon Alas   On: 01/25/2014 00:28   Dg Chest Portable 1 View  01/24/2014   CLINICAL DATA:  Weakness.  History of diabetes.  EXAM: PORTABLE CHEST - 1 VIEW  COMPARISON:  One-view chest 06/27/2013.  FINDINGS: The heart is  enlarged. There is no edema or effusion to suggest failure. No focal airspace disease  is present. Pacing wires are stable. Advanced degenerative changes in the shoulders is again noted.  IMPRESSION: 1. Cardiomegaly without failure. 2. No acute cardiopulmonary disease.   Electronically Signed   By: Lawrence Santiago M.D.   On: 01/24/2014 21:28    EKG: Independently reviewed.  Assessment/Plan Principal Problem:   SIRS (systemic inflammatory response syndrome) Active Problems:   Atrial fibrillation   Transaminitis   Hyperbilirubinemia   Abdominal pain   1. Clinical picture of SIRS, Transaminitis, hyperbilirubenemia, elevated alk phos and abdominal pain - DDX includes early ascending cholangitis, viral hepatitis. 1. Patient on zofran and vanc per pharm consult for empiric coverage of possible ascending cholangitis, imaging shows intrahepatic ductal dilation but this may be normal and chronic s/p cholecystectomy. 2. Acute viral hepatitis panel and CMV ordered 3. Patient not candidate for MRCP due to presence of pacemaker 4. GI consulted and will see patient tomorrow in evaluation 2. HTN - continue home meds 3. H/o A.Fib - Holding home amiodarone she takes for A.Fib due to LFT abnormalities, will put patient on tele monitor and rate control her if she goes back in to a.fib 4. CKD - creatinine appears to be at (or slightly better than) previous baseline, will trend during stay, IVF to prevent dehydration while NPO.    Code Status: DNR  Family Communication: No family in room Disposition Plan: Admit to inpatient   Time spent: 52 min  Wahneta Derocher M. [facility] Pager [phone]  If 7AM-7PM, please contact the day team taking care of the patient [email or web address] Password [email or web address] 01/25/2014, 1:28 AM

## 2014-01-25 NOTE — Evaluation (Signed)
Physical Therapy Evaluation Patient Details Name: [name] NipVictoria E [name] MRN: [id number] DOB: [birth year] Today's Date: 01/25/2014   History of Present Illness  Admitted with abdominal pain, N/V, fever, ?SIRS.  Evaluation in progress.  Possible EGD  Clinical Impression  Pt is approaching baseline function, but knows she is not presently at 100%.  She should be safe in her home with intermittent assist from her children.  No further PT needs.  Signing off.    Follow Up Recommendations No PT follow up    Equipment Recommendations  None recommended by PT    Recommendations for Other Services       Precautions / Restrictions Precautions Precautions: Fall      Mobility  Bed Mobility Overal bed mobility: Modified Independent                Transfers Overall transfer level: Modified independent                  Ambulation/Gait Ambulation/Gait assistance: Supervision Ambulation Distance (Feet): 170 Feet Assistive device: None Gait Pattern/deviations: WFL(Within Functional Limits) Gait velocity: slower, but can increase speed to cuing   General Gait Details: mildly unsteady at times especially when scanning environment  Stairs Stairs: Yes Stairs assistance: Supervision Stair Management: One rail Right;Alternating pattern;Forwards Number of Stairs: 2 General stair comments: steady as long as uses Presenter, broadcastingrail  Wheelchair Mobility    Modified Rankin (Stroke Patients Only)       Balance Overall balance assessment: Needs assistance Sitting-balance support: No upper extremity supported Sitting balance-Leahy Scale: Good     Standing balance support: No upper extremity supported Standing balance-Leahy Scale: Fair                               Pertinent Vitals/Pain     Home Living Family/patient expects to be discharged to:: Private residence Living Arrangements: Alone Available Help at Discharge: Family;Available PRN/intermittently;Other (Comment)  (as needed) Type of Home: House Home Access: Stairs to enter Entrance Stairs-Rails: Right Entrance Stairs-Number of Steps: 2 Home Layout: One level Home Equipment: Walker - 2 wheels;Cane - single point Additional Comments: doesn't use devices in home and goes out with family assist    Prior Function Level of Independence: Independent (in home environment)               Hand Dominance        Extremity/Trunk Assessment               Lower Extremity Assessment: Overall WFL for tasks assessed (mild weakness from decr activity)         Communication   Communication: No difficulties  Cognition Arousal/Alertness: Awake/alert Behavior During Therapy: WFL for tasks assessed/performed Overall Cognitive Status: Within Functional Limits for tasks assessed                      General Comments      Exercises        Assessment/Plan    PT Assessment Patient needs continued PT services  PT Diagnosis     PT Problem List Decreased mobility  PT Treatment Interventions     PT Goals (Current goals can be found in the Care Plan section) Acute Rehab PT Goals PT Goal Formulation: No goals set, d/c therapy    Frequency     Barriers to discharge        Co-evaluation  End of Session   Activity Tolerance: Patient tolerated treatment well Patient left: in bed;with call bell/phone within reach;with bed alarm set;with family/visitor present Nurse Communication: Mobility status         Time: 4656-8127 PT Time Calculation (min): 21 min   Charges:   PT Evaluation $Initial PT Evaluation Tier I: 1 Procedure PT Treatments $Gait Training: 8-22 mins   PT G Codes:          Leib Elahi, Eliseo Gum 01/25/2014, 5:17 PM 01/25/2014  [place] Bing, PT [phone]  (pager)

## 2014-01-25 NOTE — Progress Notes (Signed)
[place] TEAM 1 - Stepdown/ICU TEAM Progress Note  [name] [name] QPR:[id number] DOB: [birth year] DOA: 01/24/2014 PCP: Josue Hector, MD  Admit HPI / Brief Narrative: 80 y.o. female who presented to the ED with abdominal pain and generalized weakness. Abdominal pain was located in her epigastric area, aching, no radiation, and onset 8 hours prior. It had been constant since onset. She has a history of cholecystectomy in the past. She has also had nausea and vomiting.  HPI/Subjective: Pt seen for f/u visit.  Assessment/Plan:  SIRS (Temp 102.5 + HR 117) Etiology unclear - lactate and WBC normal - BP stable/elevated - now normothermic and HR normal - possible simply due to overheating with DH - SIRS physiology has resolved at this time   Transaminitis  Viral hep panel negative - possible mild shock liver in setting of DH/overheating - follow - no signif findings on CT or [place] or abdom   Presyncopal spell Pt reports sitting on her front porch for a prolonged period 6/14, after which she began to feel dizzy and lightheaded and was mildly confused - per her hx this persisted until she presented to the ED and was given IVF  Abdom/epigastric pain Pt reports this pain has been present for 2+ weeks, and seems to be much worse when she eats - ?peptic ulcer disease - perhaps an EGD would prove helpful, either now or in oupt f/u   DM CBG well controlled   CKD  CAD s/p CABG 2004 Patent grafts via cath 2011 - followed by [facility]  Ischemic CM - chronic systolic CHF   HTN  HLD  Hx of HIT  COPD  Afib/flutter Hx of DCCV and amio use - not on long term anticoag due to falls  RAS s/p B stents  Code Status: NO CODE BLUE Family Communication: no family present at time of exam Disposition Plan: stable for transfer to med bed   Consultants: GI  Procedures: none  Antibiotics: Zosyn 6/14 Vanc 6/14  DVT prophylaxis: SCDs (hx of HIT)  Objective: Blood pressure  147/57, pulse 80, temperature 97.7 F (36.5 C), temperature source Oral, resp. rate 20, height 5' 4.17" (1.63 m), weight 49.8 kg (109 lb 12.6 oz), SpO2 100.00%.  Intake/Output Summary (Last 24 hours) at 01/25/14 0830 Last data filed at 01/25/14 0800  Gross per 24 hour  Intake 1484.17 ml  Output    550 ml  Net 934.17 ml   Exam: F/U exam completed   Data Reviewed: Basic Metabolic Panel:  Recent Labs Lab 01/24/14 2054  NA 136*  K 4.0  CL 94*  CO2 25  GLUCOSE 161*  BUN 32*  CREATININE 1.29*  CALCIUM 10.1   Liver Function Tests:  Recent Labs Lab 01/24/14 2054  AST 717*  ALT 356*  ALKPHOS 265*  BILITOT 3.5*  PROT 8.0  ALBUMIN 4.1    Recent Labs Lab 01/24/14 2054  LIPASE 24   CBC:  Recent Labs Lab 01/24/14 2054  WBC 8.3  NEUTROABS 7.4  HGB 12.6  HCT 38.4  MCV 91.4  PLT 171   CBG:  Recent Labs Lab 01/25/14 0244 01/25/14 0755  GLUCAP 123* 101*    Recent Results (from the past 240 hour(s))  MRSA PCR SCREENING     Status: None   Collection Time    01/25/14  5:40 AM      Result Value Ref Range Status   MRSA by PCR NEGATIVE  NEGATIVE Final   Comment:  The GeneXpert MRSA Assay (FDA     approved for NASAL specimens     only), is one component of a     comprehensive MRSA colonization     surveillance program. It is not     intended to diagnose MRSA     infection nor to guide or     monitor treatment for     MRSA infections.     Studies:  Recent x-ray studies have been reviewed in detail by the Attending Physician  Scheduled Meds:  Scheduled Meds: . antiseptic oral rinse  15 mL Mouth Rinse BID  . aspirin EC  81 mg Oral Daily  . atorvastatin  10 mg Oral q1800  . carvedilol  12.5 mg Oral BID WC  . docusate sodium  200 mg Oral BID  . furosemide  40 mg Oral Daily  . HYDROcodone-acetaminophen  1 tablet Oral 4 times per day  . isosorbide mononitrate  60 mg Oral Daily  . lactose free nutrition  237 mL Oral TID BM  . levothyroxine   88 mcg Oral QAC breakfast  . pantoprazole  80 mg Oral Daily  . piperacillin-tazobactam (ZOSYN)  IV  3.375 g Intravenous 3 times per day  . potassium chloride  10 mEq Oral Daily  . sodium chloride  3 mL Intravenous Q12H  . tamsulosin  0.4 mg Oral Daily  . vancomycin  500 mg Intravenous Q24H    Time spent on care of this patient: 25+ mins   MCCLUNG,JEFFREY T , MD   [facility] Office  [phone] Pager - Text Page per Loretha StaplerAmion as per below:  On-Call/Text Page:      [email or web address]      password [email or web address]  If 7PM-7AM, please contact night-coverage [email or web address] Password [email or web address] 01/25/2014, 8:30 AM   LOS: 1 day

## 2014-01-26 DIAGNOSIS — A419 Sepsis, unspecified organism: Secondary | ICD-10-CM

## 2014-01-26 DIAGNOSIS — R740 Nonspecific elevation of levels of transaminase and lactic acid dehydrogenase [LDH]: Secondary | ICD-10-CM

## 2014-01-26 DIAGNOSIS — E119 Type 2 diabetes mellitus without complications: Secondary | ICD-10-CM

## 2014-01-26 DIAGNOSIS — R7401 Elevation of levels of liver transaminase levels: Secondary | ICD-10-CM

## 2014-01-26 LAB — CBC
HEMATOCRIT: 30.7 % — AB (ref 36.0–46.0)
HEMOGLOBIN: 9.9 g/dL — AB (ref 12.0–15.0)
MCH: 29.7 pg (ref 26.0–34.0)
MCHC: 32.2 g/dL (ref 30.0–36.0)
MCV: 92.2 fL (ref 78.0–100.0)
Platelets: 128 10*3/uL — ABNORMAL LOW (ref 150–400)
RBC: 3.33 MIL/uL — ABNORMAL LOW (ref 3.87–5.11)
RDW: 15.3 % (ref 11.5–15.5)
WBC: 5 10*3/uL (ref 4.0–10.5)

## 2014-01-26 LAB — COMPREHENSIVE METABOLIC PANEL
ALBUMIN: 3.2 g/dL — AB (ref 3.5–5.2)
ALT: 262 U/L — AB (ref 0–35)
AST: 226 U/L — AB (ref 0–37)
Alkaline Phosphatase: 172 U/L — ABNORMAL HIGH (ref 39–117)
BUN: 23 mg/dL (ref 6–23)
CO2: 23 mEq/L (ref 19–32)
CREATININE: 1.3 mg/dL — AB (ref 0.50–1.10)
Calcium: 8.9 mg/dL (ref 8.4–10.5)
Chloride: 102 mEq/L (ref 96–112)
GFR calc Af Amer: 42 mL/min — ABNORMAL LOW (ref >90)
GFR, EST NON AFRICAN AMERICAN: 36 mL/min — AB (ref >90)
Glucose, Bld: 105 mg/dL — ABNORMAL HIGH (ref 70–99)
Potassium: 3.9 mEq/L (ref 3.7–5.3)
SODIUM: 138 meq/L (ref 137–147)
TOTAL PROTEIN: 6.3 g/dL (ref 6.0–8.3)
Total Bilirubin: 1.4 mg/dL — ABNORMAL HIGH (ref 0.3–1.2)

## 2014-01-26 LAB — APTT: aPTT: 32 seconds (ref 24–37)

## 2014-01-26 LAB — PRO B NATRIURETIC PEPTIDE: PRO B NATRI PEPTIDE: 9027 pg/mL — AB (ref 0–450)

## 2014-01-26 LAB — PROTIME-INR
INR: 1.3 (ref 0.00–1.49)
Prothrombin Time: 15.9 seconds — ABNORMAL HIGH (ref 11.6–15.2)

## 2014-01-26 MED ORDER — FUROSEMIDE 10 MG/ML IJ SOLN
20.0000 mg | Freq: Two times a day (BID) | INTRAMUSCULAR | Status: DC
  Administered 2014-01-26 – 2014-01-28 (×4): 20 mg via INTRAVENOUS
  Filled 2014-01-26 (×4): qty 2

## 2014-01-26 MED ORDER — PIPERACILLIN-TAZOBACTAM 3.375 G IVPB
3.3750 g | Freq: Three times a day (TID) | INTRAVENOUS | Status: DC
  Administered 2014-01-26 – 2014-01-28 (×5): 3.375 g via INTRAVENOUS
  Filled 2014-01-26 (×7): qty 50

## 2014-01-26 MED ORDER — ALPRAZOLAM 0.25 MG PO TABS
0.2500 mg | ORAL_TABLET | Freq: Two times a day (BID) | ORAL | Status: DC | PRN
  Administered 2014-01-26 – 2014-01-29 (×7): 0.25 mg via ORAL
  Filled 2014-01-26 (×7): qty 1

## 2014-01-26 MED ORDER — PIPERACILLIN-TAZOBACTAM 3.375 G IVPB 30 MIN
3.3750 g | Freq: Once | INTRAVENOUS | Status: AC
  Administered 2014-01-26: 3.375 g via INTRAVENOUS
  Filled 2014-01-26 (×2): qty 50

## 2014-01-26 MED ORDER — FUROSEMIDE 10 MG/ML IJ SOLN
INTRAMUSCULAR | Status: AC
  Administered 2014-01-26: 20 mg via INTRAVENOUS
  Filled 2014-01-26: qty 4

## 2014-01-26 MED ORDER — INSULIN ASPART 100 UNIT/ML ~~LOC~~ SOLN
0.0000 [IU] | Freq: Three times a day (TID) | SUBCUTANEOUS | Status: DC
Start: 2014-01-27 — End: 2014-01-29
  Administered 2014-01-27 – 2014-01-28 (×3): 1 [IU] via SUBCUTANEOUS

## 2014-01-26 NOTE — Progress Notes (Signed)
ANTIBIOTIC CONSULT NOTE - INITIAL  Pharmacy Consult for Zosyn Indication: 1/2 BCx growing GNR  Allergies  Allergen Reactions  . Heparin Other (See Comments)    Clots blood instead of thinning  . Hydromorphone Hcl Other (See Comments)    Crazy feeling  . Warfarin And Related Other (See Comments)    Syncope events  . Altace [Ramipril] Hives    This is not a listed allergy on MAR provided by the [facility]  . Contrast Media [Iodinated Diagnostic Agents] Other (See Comments)    Reaction noted by md-unknown reaction per family  . Morphine And Related Nausea And Vomiting  . Plavix [Clopidogrel Bisulfate] Other (See Comments)    unknown    Patient Measurements: Height: 5' 4.17" (163 cm) Weight: 109 lb 12.6 oz (49.8 kg) IBW/kg (Calculated) : 55.1  Vital Signs: Temp: 97.6 F (36.4 C) (06/16 0500) Temp src: Oral (06/16 0500) BP: 134/70 mmHg (06/16 0500) Pulse Rate: 81 (06/16 0500) Intake/Output from previous day: 06/15 0701 - 06/16 0700 In: 1005 [P.O.:480; I.V.:500; IV Piggyback:25] Out: -  Intake/Output from this shift:    Labs:  Recent Labs  01/24/14 2054 01/26/14 0510  WBC 8.3 5.0  HGB 12.6 9.9*  PLT 171 128*  CREATININE 1.29* 1.30*   Estimated Creatinine Clearance: 24.4 ml/min (by C-G formula based on Cr of 1.3). No results found for this basename: Rolm Gala, VANCORANDOM, GENTTROUGH, GENTPEAK, GENTRANDOM, TOBRATROUGH, TOBRAPEAK, TOBRARND, AMIKACINPEAK, AMIKACINTROU, AMIKACIN,  in the last 72 hours   Microbiology: Recent Results (from the past 720 hour(s))  URINE CULTURE     Status: None   Collection Time    01/24/14  8:57 PM      Result Value Ref Range Status   Specimen Description URINE, CLEAN CATCH   Final   Special Requests NONE   Final   Culture  Setup Time     Final   Value: 01/25/2014 00:57     Performed at [facility] Count     Final   Value: 60,000 COLONIES/ML     Performed at [facility]   Culture      Final   Value: Multiple bacterial morphotypes present, none predominant. Suggest appropriate recollection if clinically indicated.     Performed at [facility]   Report Status 01/25/2014 FINAL   Final  CULTURE, BLOOD (ROUTINE X 2)     Status: None   Collection Time    01/24/14  9:11 PM      Result Value Ref Range Status   Specimen Description BLOOD RIGHT ANTECUBITAL   Final   Special Requests BOTTLES DRAWN AEROBIC AND ANAEROBIC 10CC EACH   Final   Culture  Setup Time     Final   Value: 01/25/2014 00:45     Performed at [facility]   Culture     Final   Value:        BLOOD CULTURE RECEIVED NO GROWTH TO DATE CULTURE WILL BE HELD FOR 5 DAYS BEFORE ISSUING A FINAL NEGATIVE REPORT     Performed at [facility]   Report Status PENDING   Incomplete  CULTURE, BLOOD (ROUTINE X 2)     Status: None   Collection Time    01/24/14  9:15 PM      Result Value Ref Range Status   Specimen Description BLOOD RIGHT HAND   Final   Special Requests BOTTLES DRAWN AEROBIC ONLY 6CC   Final   Culture  Setup  Time     Final   Value: 01/25/2014 00:45     Performed at [facility]   Culture     Final   Value: GRAM NEGATIVE RODS     Note: Gram Stain Report Called to,Read Back By and Verified With: Elsie Lincoln 01/26/14 1020 BY SMITHERSJ     Performed at [facility]   Report Status PENDING   Incomplete  MRSA PCR SCREENING     Status: None   Collection Time    01/25/14  5:40 AM      Result Value Ref Range Status   MRSA by PCR NEGATIVE  NEGATIVE Final   Comment:            The GeneXpert MRSA Assay (FDA     approved for NASAL specimens     only), is one component of a     comprehensive MRSA colonization     surveillance program. It is not     intended to diagnose MRSA     infection nor to guide or     monitor treatment for     MRSA infections.    Medical History: Past Medical History  Diagnosis Date  . Diabetes mellitus without complication   . Chest pain    . DJD (degenerative joint disease)   . Tachycardia-bradycardia syndrome   . CKD (chronic kidney disease)   . Cardiomegaly   . Coronary artery disease     a. LAD stent 1999 after NSTEMI. b. CABG 2004. c. Last LHC 02/2010 - patent grafts.  . Hypertension   . Hyperlipidemia   . Carotid artery disease     status post right carotid endarterectomy.  . Degenerative joint disease   . Restless leg syndrome   . Chronic renal insufficiency   . Heparin induced thrombocytopenia   . Diphtheria     "as a child"  . Pleural effusion, bilateral 06/2003  . Pseudoaneurysm     status post repair following catherization in 1999  . Chronic systolic heart failure 10/02/11  . Ischemic cardiomyopathy     Echocardiogram 10/01/11: Mild LVH, EF 35-40%, basal inferior, basal to mid posterior and basal to mid anterolateral severe hypokinesis, mild to moderate AI, moderate MR (ischemic MR), moderate LAE, mild RVE, mildly reduced RV systolic function, mild RAE, PASP 43-44  . COPD (chronic obstructive pulmonary disease)   . Atrial fibrillation/flutter     s/p multiple DCCVs;  amiodarone started 09/2011, TEE DCCV 3/13;  amio and coumadin d/c'd after admxn to [facility] 02/2012 with frequent falls/syncope  . Tachycardia-bradycardia syndrome     s/p Medtronic pacemaker implant 09/2011 (8 sec pause noted)  . GERD (gastroesophageal reflux disease)   . Renal artery stenosis     bilateral- status post stenting  . Non-functioning kidney   . Depression     "cries alot"  . Gout   . Hypothyroidism   . Anemia     Assessment: 89 YOF who presented [facility] the [facility] on [facility]/14 with abdominal pain, generalized weakness, and SIRs. The patient was started on Zosyn + Vancomycin after BCx were drawn. Antibiotics were discontinued on 6/15 per the rounding MD as SIRs thought to be d/t overheating and not infection. Pharmacy re-consulted to start Zosyn today for 1/2 BCx positive for GNR.   Goal of Therapy:  Proper antibiotics for infection/cultures  adjusted for renal/hepatic function   Plan:  1. Zosyn 3.375g x 1 dose now (infused over 30 minutes) followed by Zosyn 3.375g IV every 8  hours (infused over 4 hours) 2. Will continue to follow renal function, culture results, LOT, and antibiotic de-escalation plans   Georgina PillionElizabeth Martin, PharmD, BCPS Clinical Pharmacist Pager: [phone] 01/26/2014 10:50 AM

## 2014-01-26 NOTE — Progress Notes (Signed)
CRITICAL VALUE ALERT  Critical value received:  Positive Blood Cultures: Aerobic bottle, gram negative rods  Date of notification:  01/26/14  Time of notification:  10:25  Critical value read back:yes  Nurse who received alert:  Si Raider  MD notified (1st page):  Virginia Rochester  Time of first page:  10:25  MD notified (2nd page):  Time of second page:  Responding MD:  Chancy Milroy  Time MD responded:  10:26

## 2014-01-26 NOTE — Progress Notes (Addendum)
[facility] PROGRESS NOTE  [name] [name] ZOX:[id number] DOB: Jun 08, 1925 DOA: 01/24/2014 PCP: Josue Hector, MD  Interim summary:  Patient with past medical history of atrial fibrillation, systolic/diastolic heart failure and diet controlled diabetes presents to the emergency room on 6/14 with one-day history of abdominal pain-midepigastric along with associated nausea and vomiting. In the emergency room she was noted to have an elevated temperature of 102.5, elevated heart rate and transaminitis consistent with shock liver. CT scan of the abdomen was actually unremarkable. Patient given 1 dose of IV vancomycin and Zosyn plus IV fluids and admitted to hospitalist service instead.  Continue to improve and transferred to floor. Remains afebrile, improvement transaminases and pressure. Blood cultures came back positive for gram-negative rods one bottle. Patient also found to be in acute on chronic heart failure and started on IV diuretics.  Assessment/Plan: Principal Problem:   Sepsis: Source? Could be contaminant, but gram-negative rods seems unlikely. Urine on admission was clean. Await bacteria and sensitivities. Have started IV Zosyn which should cover gut coverage. Active Problems:   DM: Diet controlled. Will use sliding scale and check A1c.   Atrial fibrillation: Rate control.   Acute on chronic systolic/diastolic heart failure: Stopped IV fluids and started IV Lasix    Hypothyroidism: On Synthroid   Transaminitis: Secondary to shock liver. Improving.   Hyperbilirubinemia: Improving.   Abdominal pain: Unclear GI process. Resolved. Question if this was a passed common bile duct stone. Patient is status post cholecystectomy.awaiting GI consultation Code Status: DO NOT RESUSCITATE  Family Communication: Left message with family  Disposition Plan: Continue in-hospital   Consultants:  Gastroenterology  Procedures:  None  Antibiotics:  IV Zosyn and vancomycin given  x1 dose 6/14  IV Zosyn 6/16-present  HPI/Subjective: Patient final but better. Hungry.  Objective: Filed Vitals:   01/26/14 1039  BP: 121/53  Pulse: 76  Temp: 97.4 F (36.3 C)  Resp: 18    Intake/Output Summary (Last 24 hours) at 01/26/14 1750 Last data filed at 01/25/14 1903  Gross per 24 hour  Intake    360 ml  Output      0 ml  Net    360 ml   Filed Weights   01/24/14 2100 01/25/14 0200 01/26/14 1300  Weight: 52.8 kg (116 lb 6.5 oz) 49.8 kg (109 lb 12.6 oz) 57.743 kg (127 lb 4.8 oz)    Exam:   General: Alert and oriented x3, no acute distress  Cardiovascular: Frequent ectopic beats  Respiratory: Decreased breath sounds bibasilar  Abdomen: Soft, nondistended, hyperactive bowel sounds, nontender  Musculoskeletal: No clubbing or cyanosis, trace pitting edema  Data Reviewed: Basic Metabolic Panel:  Recent Labs Lab 01/24/14 2054 01/26/14 0510  NA 136* 138  K 4.0 3.9  CL 94* 102  CO2 25 23  GLUCOSE 161* 105*  BUN 32* 23  CREATININE 1.29* 1.30*  CALCIUM 10.1 8.9   Liver Function Tests:  Recent Labs Lab 01/24/14 2054 01/26/14 0510  AST 717* 226*  ALT 356* 262*  ALKPHOS 265* 172*  BILITOT 3.5* 1.4*  PROT 8.0 6.3  ALBUMIN 4.1 3.2*    Recent Labs Lab 01/24/14 2054  LIPASE 24   No results found for this basename: AMMONIA,  in the last 168 hours CBC:  Recent Labs Lab 01/24/14 2054 01/26/14 0510  WBC 8.3 5.0  NEUTROABS 7.4  --   HGB 12.6 9.9*  HCT 38.4 30.7*  MCV 91.4 92.2  PLT 171 128*   Cardiac Enzymes: No results found for this  basename: CKTOTAL, CKMB, CKMBINDEX, TROPONINI,  in the last 168 hours BNP (last 3 results)  Recent Labs  01/26/14 1030  PROBNP 9027.0*   CBG:  Recent Labs Lab 01/25/14 0244 01/25/14 0755 01/25/14 1136  GLUCAP 123* 101* 196*    Recent Results (from the past 240 hour(s))  URINE CULTURE     Status: None   Collection Time    01/24/14  8:57 PM      Result Value Ref Range Status   Specimen  Description URINE, CLEAN CATCH   Final   Special Requests NONE   Final   Culture  Setup Time     Final   Value: 01/25/2014 00:57     [facility]formed [facility]   Colony Count     Final   Value: 60,000 COLONIES/ML     [facility]formed at [facility]   Culture     Final   Value: Multiple bacterial morphotypes present, none predominant. Suggest appropriate recollection if clinically indicated.     Performed at [facility]   Report Status 01/25/2014 FINAL   Final  CULTURE, BLOOD (ROUTINE X 2)     Status: None   Collection Time    01/24/14  9:11 PM      Result Value Ref Range Status   Specimen Description BLOOD RIGHT ANTECUBITAL   Final   Special Requests BOTTLES DRAWN AEROBIC AND ANAEROBIC 10CC EACH   Final   Culture  Setup Time     Final   Value: 01/25/2014 00:45     Performed at [facility]   Culture     Final   Value:        BLOOD CULTURE RECEIVED NO GROWTH TO DATE CULTURE WILL BE HELD FOR 5 DAYS BEFORE ISSUING A FINAL NEGATIVE REPORT     Performed at [facility]   Report Status PENDING   Incomplete  CULTURE, BLOOD (ROUTINE X 2)     Status: None   Collection Time    01/24/14  9:15 PM      Result Value Ref Range Status   Specimen Description BLOOD RIGHT HAND   Final   Special Requests BOTTLES DRAWN AEROBIC ONLY [facility]6CC   Final   Culture  Setup Time     Final   Value: 01/25/2014 00:45     Performed at [facility]   Culture     Final   Value: GRAM NEGATIVE RODS     Note: Gram Stain Report Called to,Read Back By and Verified With: Elsie LincolnKATHY WICKER 01/26/14 1020 BY SMITHERSJ     Performed at [facility]   Report Status PENDING   Incomplete  MRSA PCR SCREENING     Status: None   Collection Time    01/25/14  5:40 AM      Result Value Ref Range Status   MRSA by PCR NEGATIVE  NEGATIVE Final   Comment:            The GeneXpert MRSA Assay (FDA     approved for NASAL specimens     only), is one component of a     comprehensive MRSA  colonization     surveillance program. It is not     intended to diagnose MRSA     infection nor to guide or     monitor treatment for     MRSA infections.     Studies: Ct Abdomen Pelvis Wo Contrast  01/24/2014     IMPRESSION:  1. No definite acute abnormalities seen in the abdomen or pelvis. 2. Diffuse calcification along the abdominal aorta and its branches, with relatively diffuse calcification involving the superior mesenteric artery. Stents within both proximal renal arteries are grossly unremarkable in appearance. Ectasia of the infrarenal abdominal aorta, without evidence of aneurysmal dilatation. 3. Minimal diverticulosis along the mid sigmoid colon, without evidence of diverticulitis. 4. Scattered coronary artery calcifications seen. 5. 1.6 cm rounded density at the right adrenal gland is nonspecific, but is increased only mildly in size from 2011, and is likely benign.   Electronically Signed   By: Roanna Raider M.D.   On: 01/24/2014 22:10   US Abdomen Complete  01/25/2014    IMPRESSION: No acute abdominal process by sonography. Status post cholecystectomy.  Mild symmetric cortical renal atrophy.   Electronically Signed   By: Awilda Metro   On: 01/25/2014 00:28   Dg Chest Portable 1 View  01/24/2014   CLINICAL DATA:  Weakness.  History of diabetes.  EXAM: PORTABLE CHEST - 1 VIEW  COMPARISON:  One-view chest 06/27/2013.  FINDINGS: The heart is enlarged. There is no edema or effusion to suggest failure. No focal airspace disease is present. Pacing wires are stable. Advanced degenerative changes in the shoulders is again noted.  IMPRESSION: 1. Cardiomegaly without failure. 2. No acute cardiopulmonary disease.   Electronically Signed   By: Gennette Pac M.D.   On: 01/24/2014 21:28    Scheduled Meds: . antiseptic oral rinse  15 mL Mouth Rinse BID  . aspirin EC  81 mg Oral Daily  . atorvastatin  10 mg Oral q1800  . carvedilol  12.5 mg Oral BID WC  . docusate sodium  200 mg Oral BID   . furosemide  20 mg Intravenous Q12H  . HYDROcodone-acetaminophen  1 tablet Oral 4 times per day  . isosorbide mononitrate  60 mg Oral Daily  . lactose free nutrition  237 mL Oral TID BM  . levothyroxine  88 mcg Oral QAC breakfast  . pantoprazole  80 mg Oral Daily  . piperacillin-tazobactam (ZOSYN)  IV  3.375 g Intravenous Q8H  . potassium chloride  10 mEq Oral Daily  . sodium chloride  3 mL Intravenous Q12H  . tamsulosin  0.4 mg Oral Daily   Continuous Infusions:   Principal Problem:   Sepsis Active Problems:   DM   Atrial fibrillation   Acute on chronic systolic heart failure   Acute on chronic systolic CHF (congestive heart failure), NYHA class 4   Hypothyroidism   Transaminitis   Hyperbilirubinemia   Abdominal pain    Time spent: 35 minutes    Hollice Espy  [facility] Pager [phone]. If 7PM-7AM, please contact night-coverage at [email or web address], password [facility] 01/26/2014, 5:50 PM  LOS: 2 days

## 2014-01-27 DIAGNOSIS — I5022 Chronic systolic (congestive) heart failure: Secondary | ICD-10-CM

## 2014-01-27 DIAGNOSIS — N183 Chronic kidney disease, stage 3 unspecified: Secondary | ICD-10-CM

## 2014-01-27 DIAGNOSIS — D649 Anemia, unspecified: Secondary | ICD-10-CM

## 2014-01-27 LAB — COMPREHENSIVE METABOLIC PANEL
ALBUMIN: 3.2 g/dL — AB (ref 3.5–5.2)
ALK PHOS: 157 U/L — AB (ref 39–117)
ALT: 175 U/L — ABNORMAL HIGH (ref 0–35)
AST: 90 U/L — AB (ref 0–37)
BUN: 23 mg/dL (ref 6–23)
CHLORIDE: 102 meq/L (ref 96–112)
CO2: 26 mEq/L (ref 19–32)
Calcium: 9 mg/dL (ref 8.4–10.5)
Creatinine, Ser: 1.41 mg/dL — ABNORMAL HIGH (ref 0.50–1.10)
GFR calc Af Amer: 38 mL/min — ABNORMAL LOW (ref >90)
GFR calc non Af Amer: 33 mL/min — ABNORMAL LOW (ref >90)
Glucose, Bld: 96 mg/dL (ref 70–99)
POTASSIUM: 3.5 meq/L — AB (ref 3.7–5.3)
SODIUM: 142 meq/L (ref 137–147)
TOTAL PROTEIN: 6.5 g/dL (ref 6.0–8.3)
Total Bilirubin: 0.7 mg/dL (ref 0.3–1.2)

## 2014-01-27 LAB — CBC
HEMATOCRIT: 32.5 % — AB (ref 36.0–46.0)
Hemoglobin: 10.3 g/dL — ABNORMAL LOW (ref 12.0–15.0)
MCH: 30.1 pg (ref 26.0–34.0)
MCHC: 31.7 g/dL (ref 30.0–36.0)
MCV: 95 fL (ref 78.0–100.0)
PLATELETS: 159 10*3/uL (ref 150–400)
RBC: 3.42 MIL/uL — ABNORMAL LOW (ref 3.87–5.11)
RDW: 15.4 % (ref 11.5–15.5)
WBC: 4.5 10*3/uL (ref 4.0–10.5)

## 2014-01-27 LAB — CMV (CYTOMEGALOVIRUS) DNA ULTRAQUANT, PCR: CMV DNA Quant: <200 copies/mL

## 2014-01-27 LAB — GLUCOSE, CAPILLARY
Glucose-Capillary: 115 mg/dL — ABNORMAL HIGH (ref 70–99)
Glucose-Capillary: 112 mg/dL — ABNORMAL HIGH (ref 70–99)
Glucose-Capillary: 139 mg/dL — ABNORMAL HIGH (ref 70–99)
Glucose-Capillary: 129 mg/dL — ABNORMAL HIGH (ref 70–99)

## 2014-01-27 LAB — HEMOGLOBIN A1C
Hgb A1c MFr Bld: 6.7 % — ABNORMAL HIGH (ref <5.7)
Mean Plasma Glucose: 146 mg/dL — ABNORMAL HIGH (ref <117)

## 2014-01-27 MED ORDER — ENSURE COMPLETE PO LIQD
237.0000 mL | Freq: Two times a day (BID) | ORAL | Status: DC
  Administered 2014-01-27 – 2014-01-29 (×5): 237 mL via ORAL

## 2014-01-27 NOTE — Progress Notes (Signed)
INITIAL NUTRITION ASSESSMENT  DOCUMENTATION CODES Per approved criteria  -Not Applicable   INTERVENTION: Change Boost to Ensure Complete - pt would like chocolate flavor twice daily. RD to continue to follow nutrition care plan.  NUTRITION DIAGNOSIS: Inadequate oral intake related to altered GI function as evidenced by limited oral intake.   Goal: Intake to meet >90% of estimated nutrition needs.  Monitor:  weight trends, lab trends, I/O's, PO intake, supplement tolerance  Reason for Assessment: Malnutrition Screening Tool  80 y.o. female  Admitting Dx: Sepsis  ASSESSMENT: PMHx significant for DM, DJD, CKD, CAD, HTN, HLD, COPD, GERD. Admitted with abdominal pain and generalized weakness. Work-up reveals SIRS from unknown etiology.  MD is questioning if pt passed a common bile duct stone. GI consult pending.  Pt reports that she consumed all of her dinner last night. Did not eat her breakfast because she doesn't like eggs. Has been eating well at home prior to onset of symptoms. Has recently gained about 2 lb. Pt consumes small frequent meals. Pt with Resource Breeze at bedside, would prefer chocolate Boost or Ensure instead. RD to arrange. She drinks 2 daily at home.  CBG's: 112 - 196 Potassium low at 3.5  Height: Ht Readings from Last 1 Encounters:  01/24/14 5' 4.17" (1.63 m)    Weight: Wt Readings from Last 1 Encounters:  01/26/14 110 lb 9.6 oz (50.168 kg)    Ideal Body Weight: 120 lb  % Ideal Body Weight: 92%  Wt Readings from Last 10 Encounters:  01/26/14 110 lb 9.6 oz (50.168 kg)  02/04/13 116 lb 6.4 oz (52.799 kg)  02/04/13 116 lb 6.4 oz (52.799 kg)  01/11/13 121 lb 11.1 oz (55.2 kg)  11/27/12 108 lb 4.8 oz (49.125 kg)  09/05/12 110 lb 4 oz (50.01 kg)  08/29/12 114 lb 4.8 oz (51.846 kg)  07/09/12 112 lb (50.803 kg)  06/16/12 113 lb 12.8 oz (51.619 kg)  06/09/12 109 lb 6.4 oz (49.624 kg)    Usual Body Weight: 110 - 115 lb  % Usual Body Weight:  within range  BMI:  Body mass index is 18.88 kg/(m^2). WNL  Estimated Nutritional Needs: Kcal: 1400 - 1600 Protein: 50 - 60 g Fluid: at least 1.5 liters  Skin: intact  Diet Order: Parke SimmersBland  EDUCATION NEEDS: -No education needs identified at this time   Intake/Output Summary (Last 24 hours) at 01/27/14 1108 Last data filed at 01/27/14 1041  Gross per 24 hour  Intake    483 ml  Output    900 ml  Net   -417 ml    Last BM: 6/14  Labs:   Recent Labs Lab 01/24/14 2054 01/26/14 0510 01/27/14 0640  NA 136* 138 142  K 4.0 3.9 3.5*  CL 94* 102 102  CO2 25 23 26   BUN 32* 23 23  CREATININE 1.29* 1.30* 1.41*  CALCIUM 10.1 8.9 9.0  GLUCOSE 161* 105* 96    CBG (last 3)   Recent Labs  01/25/14 1136 01/26/14 2146 01/27/14 0759  GLUCAP 196* 115* 112*    Scheduled Meds: . antiseptic oral rinse  15 mL Mouth Rinse BID  . aspirin EC  81 mg Oral Daily  . atorvastatin  10 mg Oral q1800  . carvedilol  12.5 mg Oral BID WC  . docusate sodium  200 mg Oral BID  . furosemide  20 mg Intravenous Q12H  . HYDROcodone-acetaminophen  1 tablet Oral 4 times per day  . insulin aspart  0-9 Units Subcutaneous  TID WC  . isosorbide mononitrate  60 mg Oral Daily  . lactose free nutrition  237 mL Oral TID BM  . levothyroxine  88 mcg Oral QAC breakfast  . pantoprazole  80 mg Oral Daily  . piperacillin-tazobactam (ZOSYN)  IV  3.375 g Intravenous Q8H  . potassium chloride  10 mEq Oral Daily  . sodium chloride  3 mL Intravenous Q12H  . tamsulosin  0.4 mg Oral Daily    Continuous Infusions:   Past Medical History  Diagnosis Date  . Diabetes mellitus without complication   . Chest pain   . DJD (degenerative joint disease)   . Tachycardia-bradycardia syndrome   . CKD (chronic kidney disease)   . Cardiomegaly   . Coronary artery disease     a. LAD stent 1999 after NSTEMI. b. CABG 2004. c. Last LHC 02/2010 - patent grafts.  . Hypertension   . Hyperlipidemia   . Carotid artery disease      status post right carotid endarterectomy.  . Degenerative joint disease   . Restless leg syndrome   . Chronic renal insufficiency   . Heparin induced thrombocytopenia   . Diphtheria     "as a child"  . Pleural effusion, bilateral 06/2003  . Pseudoaneurysm     status post repair following catherization in 1999  . Chronic systolic heart failure 10/02/11  . Ischemic cardiomyopathy     Echocardiogram 10/01/11: Mild LVH, EF 35-40%, basal inferior, basal to mid posterior and basal to mid anterolateral severe hypokinesis, mild to moderate AI, moderate MR (ischemic MR), moderate LAE, mild RVE, mildly reduced RV systolic function, mild RAE, PASP 43-44  . COPD (chronic obstructive pulmonary disease)   . Atrial fibrillation/flutter     s/p multiple DCCVs;  amiodarone started 09/2011, TEE DCCV 3/13;  amio and coumadin d/c'd after admxn to [facility] 02/2012 with frequent falls/syncope  . Tachycardia-bradycardia syndrome     s/p Medtronic pacemaker implant 09/2011 (8 sec pause noted)  . GERD (gastroesophageal reflux disease)   . Renal artery stenosis     bilateral- status post stenting  . Non-functioning kidney   . Depression     "cries alot"  . Gout   . Hypothyroidism   . Anemia     Past Surgical History  Procedure Laterality Date  . Pacemaker insertion    . Carotid endarterectomy  05/2003    right  . Pacemaker insertion  10/02/11    implanted by Dr Johney Frame  . Cholecystectomy  ?02/2011  . Tonsillectomy and adenoidectomy    . Renal artery stent  06/2003    bilaterally/e-chart  . Thoracentesis  ? 2000; 05/2011  . Coronary angioplasty with stent placement  1999  . Av fistula repair  01/1998    w/pseudoaneurysm repair S/P catheterization/E-chart  . Coronary artery bypass graft  05/2003    CABG X 3  . Tee without cardioversion  10/23/2011    Procedure: TRANSESOPHAGEAL ECHOCARDIOGRAM (TEE);  Surgeon: Wendall Stade, MD;  Location: [facility] END[facility];  Service: Cardiovascular;  Laterality: N/A;  .  Cardioversion  10/23/2011    Procedure: CARDIOVERSION;  Surgeon: Wendall Stade, MD;  Location: [facility] ENDOSCOPY;  Service: Cardiovascular;  Laterality: N/A;    Jarold Motto MS, RD, LDN Inpatient Registered Dietitian Pager: [phone] After-hours pager: [phone]

## 2014-01-27 NOTE — Progress Notes (Signed)
Patient ID: [name] [name]  female  WUJ:[id number]    DOB: May 17, 1925    DOA: 01/24/2014  PCP: Josue Hector, MD  Interim summary:  Patient with past medical history of atrial fibrillation, systolic/diastolic heart failure and diet controlled diabetes presents to the emergency room on 6/14 with one-day history of abdominal pain-midepigastric along with associated nausea and vomiting. In the emergency room she was noted to have an elevated temperature of 102.5, elevated heart rate and transaminitis consistent with shock liver. CT scan of the abdomen was actually unremarkable. Patient given 1 dose of IV vancomycin and Zosyn plus IV fluids and admitted to hospitalist service instead.  The patient continued to improve and transferred to floor. Remains afebrile, improvement transaminases and pressure. Blood cultures came back positive for gram-negative rods one bottle. Patient also found to be in acute on chronic heart failure and started on IV diuretics.   Assessment/Plan: Principal Problem:   Sepsis - Blood cultures one out of two gram-negative rods, unclear etiology, possible contaminant or cholangitis picture at the time of admission. Currently on Zosyn. Discussed with [facility] lab, sensitivities will be back tomorrow.  - UA negative, no respiratory symptoms  Active Problems: Abdominal pain with transaminitis and hyperbilirubinemia: Likely may have passed CBD stone, cholangitis picture at the time of admission or shock liver - Patient has no abdominal pain this morning, LFTs are improving, bilirubin normal  - Ultrasound, CT abdomen pelvis done, no acute abdominal pathology    DM - Blood sugars controlled    Atrial fibrillation - Currently stable, amiodarone currently on hold due to transaminitis - Continue aspirin, Coreg    Acute on chronic combined systolic and diastolic heart failure - Continue Lasix, still 1.5 L positive    Hypothyroidism - Continue Synthroid  DVT  Prophylaxis:  Code Status: DNR   Family Communication: left message on Ms Pam Hill's voice mail   Disposition: Hopefully tomorrow   Consultants:  None   Procedures:  None   Antibiotics:  IV Zosyn    Subjective: Tolerating diet, no abdominal pain, fevers or chills, nausea vomiting, wondering when she would be ready to go home   Objective: Weight change:   Intake/Output Summary (Last 24 hours) at 01/27/14 1056 Last data filed at 01/27/14 1041  Gross per 24 hour  Intake    483 ml  Output    900 ml  Net   -417 ml   Blood pressure 156/78, pulse 78, temperature 98 F (36.7 C), temperature source Oral, resp. rate 17, height 5' 4.17" (1.63 m), weight 50.168 kg (110 lb 9.6 oz), SpO2 94.00%.  Physical Exam: General: Alert and awake, oriented x3, not in any acute distress. CVS: S1-S2 clear, no murmur rubs or gallops Chest: clear to auscultation bilaterally, no wheezing, rales or rhonchi Abdomen: soft nontender, nondistended, normal bowel sounds  Extremities: no cyanosis, clubbing or edema noted bilaterally Neuro: Cranial nerves II-XII intact, no focal neurological deficits  Lab Results: Basic Metabolic Panel:  Recent Labs Lab 01/26/14 0510 01/27/14 0640  NA 138 142  K 3.9 3.5*  CL 102 102  CO2 23 26  GLUCOSE 105* 96  BUN 23 23  CREATININE 1.30* 1.41*  CALCIUM 8.9 9.0   Liver Function Tests:  Recent Labs Lab 01/26/14 0510 01/27/14 0640  AST 226* 90*  ALT 262* 175*  ALKPHOS 172* 157*  BILITOT 1.4* 0.7  PROT 6.3 6.5  ALBUMIN 3.2* 3.2*    Recent Labs Lab 01/24/14 2054  LIPASE 24   No  results found for this basename: AMMONIA,  in the last 168 hours CBC:  Recent Labs Lab 01/24/14 2054 01/26/14 0510 01/27/14 0640  WBC 8.3 5.0 4.5  NEUTROABS 7.4  --   --   HGB 12.6 9.9* 10.3*  HCT 38.4 30.7* 32.5*  MCV 91.4 92.2 95.0  PLT 171 128* 159   Cardiac Enzymes: No results found for this basename: CKTOTAL, CKMB, CKMBINDEX, TROPONINI,  in the last  168 hours BNP: No components found with this basename: POCBNP,  CBG:  Recent Labs Lab 01/25/14 0244 01/25/14 0755 01/25/14 1136 01/26/14 2146 01/27/14 0759  GLUCAP 123* 101* 196* 115* 112*     Micro Results: Recent Results (from the past 240 hour(s))  URINE CULTURE     Status: None   Collection Time    01/24/14  8:57 PM      Result Value Ref Range Status   Specimen Description URINE, CLEAN CATCH   Final   Special Requests NONE   Final   Culture  Setup Time     Final   Value: 01/25/2014 00:57     Performed at [facility] Count     Final   Value: 60,000 COLONIES/ML     Performed at [facility]   Culture     Final   Value: Multiple bacterial morphotypes present, none predominant. Suggest appropriate recollection if clinically indicated.     Performed at [facility]   Report Status 01/25/2014 FINAL   Final  CULTURE, BLOOD (ROUTINE X 2)     Status: None   Collection Time    01/24/14  9:11 PM      Result Value Ref Range Status   Specimen Description BLOOD RIGHT ANTECUBITAL   Final   Special Requests BOTTLES DRAWN AEROBIC AND ANAEROBIC 10CC EACH   Final   Culture  Setup Time     Final   Value: 01/25/2014 00:45     Performed at [facility]   Culture     Final   Value:        BLOOD CULTURE RECEIVED NO GROWTH TO DATE CULTURE WILL BE HELD FOR 5 DAYS BEFORE ISSUING A FINAL NEGATIVE REPORT     Performed at [facility]   Report Status PENDING   Incomplete  CULTURE, BLOOD (ROUTINE X 2)     Status: None   Collection Time    01/24/14  9:15 PM      Result Value Ref Range Status   Specimen Description BLOOD RIGHT HAND   Final   Special Requests BOTTLES DRAWN AEROBIC ONLY [facility]   Final   Culture  Setup Time     Final   Value: 01/25/2014 00:45     Performed at [facility]   Culture     Final   Value: GRAM NEGATIVE RODS     Note: Gram Stain Report Called to,Read Back By and Verified With: Elsie Lincoln 01/26/14 1020 BY  [email or web address]     Performed at [facility]   Report Status PENDING   Incomplete  MRSA PCR SCREENING     Status: None   Collection Time    01/25/14  5:40 AM      Result Value Ref Range Status   MRSA by PCR NEGATIVE  NEGATIVE Final   Comment:            The GeneXpert MRSA Assay (FDA     approved for NASAL specimens  only), is one component of a     comprehensive MRSA colonization     surveillance program. It is not     intended to diagnose MRSA     infection nor to guide or     monitor treatment for     MRSA infections.    Studies/Results: Ct Abdomen Pelvis Wo Contrast  01/24/2014   CLINICAL DATA:  Lethargy and weakness.  Epigastric abdominal pain.  EXAM: CT ABDOMEN AND PELVIS WITHOUT CONTRAST  TECHNIQUE: Multidetector CT imaging of the abdomen and pelvis was performed following the standard protocol without IV contrast.  COMPARISON:  Pelvic ultrasound performed 12/02/2012, and CT of the abdomen and pelvis performed 07/24/2010  FINDINGS: The visualized lung bases are clear. The patient is status post median sternotomy. Pacemaker leads are partially imaged. Scattered coronary artery calcifications are seen.  Prominence of the intrahepatic biliary ducts likely remains within normal limits status post cholecystectomy. Clips are noted along the gallbladder fossa. The liver and spleen are otherwise unremarkable in appearance. The pancreas and left adrenal gland are unremarkable. A 1.6 cm rounded density at the right adrenal gland is nonspecific in appearance, but has increased only mildly in size from 2011, and is likely benign.  The kidneys are unremarkable in appearance. There is no evidence of hydronephrosis. Slight prominence of both ureters remains within normal limits. There is no evidence of distal obstruction. A calcification adjacent to the left vesicoureteral junction is stable from 2011 and likely vascular in nature. No renal or ureteral stones are seen. Mild nonspecific perinephric  stranding is noted bilaterally. Bilateral extrarenal pelves are seen.  No free fluid is identified. The small bowel is unremarkable in appearance. The stomach is within normal limits. No acute vascular abnormalities are seen.  There is ectasia of the infrarenal abdominal aorta, measuring 2.9 cm in maximal transverse dimension and 2.3 cm in AP dimension. There is no evidence of aneurysmal dilatation. Diffuse calcification is seen along the abdominal aorta and its branches. Stents are noted within both proximal renal arteries, and there is relatively diffuse calcification along the superior mesenteric artery.  The appendix is not definitely seen; there is no evidence for appendicitis. Minimal diverticulosis is noted along the mid sigmoid colon, without evidence of diverticulitis. Dense stool is seen within the rectum.  The bladder is significantly distended and grossly unremarkable in appearance. The uterus is within normal limits, aside from scattered calcification. The ovaries are relatively symmetric. No suspicious adnexal masses are seen. No inguinal lymphadenopathy is seen. Postoperative change is noted at the right inguinal region.  No acute osseous abnormalities are identified. Vacuum phenomenon and disc space narrowing are seen at L4-L5.  IMPRESSION: 1. No definite acute abnormalities seen in the abdomen or pelvis. 2. Diffuse calcification along the abdominal aorta and its branches, with relatively diffuse calcification involving the superior mesenteric artery. Stents within both proximal renal arteries are grossly unremarkable in appearance. Ectasia of the infrarenal abdominal aorta, without evidence of aneurysmal dilatation. 3. Minimal diverticulosis along the mid sigmoid colon, without evidence of diverticulitis. 4. Scattered coronary artery calcifications seen. 5. 1.6 cm rounded density at the right adrenal gland is nonspecific, but is increased only mildly in size from 2011, and is likely benign.    Electronically Signed   By: Roanna RaiderJeffery  Chang M.D.   On: 01/24/2014 22:10   [place] Abdomen Complete  01/25/2014   CLINICAL DATA:  Upper abdominal pain, elevated liver function tests. Status post cholecystectomy.  EXAM: ULTRASOUND ABDOMEN COMPLETE  COMPARISON:  CT of the abdomen and pelvis January 24, 2014 at 2152 hr  FINDINGS: Gallbladder:  Surgically absent.  Common bile duct:  Diameter: 8 mm, no sonographic findings of choledocholithiasis.  Liver:  Mildly heterogeneous echotexture may reflect fatty infiltration without intrahepatic biliary dilatation. No perihepatic free fluid. Hepatopetal portal vein.  IVC:  No abnormality visualized.  Pancreas:  Visualized portion unremarkable.  Spleen:  Size and appearance within normal limits.  Right Kidney:  Length: 8.5 cm. Echogenicity within normal limits. No mass or hydronephrosis visualized. Mild thinning of the renal cortex. Extra renal pelvis.  Left Kidney:  Length: 10.3 cm. Echogenicity within normal limits. 3.7 x 3.2 x 2.9 cm air anechoic cyst in upper pole left kidney as seen on prior CT. No hydronephrosis visualized. Mild thinning of the renal cortex.  Abdominal aorta: Limited assessment due to bowel gas and presumed calcific atherosclerosis ; ectatic aorta is seen on CT of abdomen and pelvis from same day, reported separately.  No aneurysm visualized.  Other findings:  None.  IMPRESSION: No acute abdominal process by sonography. Status post cholecystectomy.  Mild symmetric cortical renal atrophy.   Electronically Signed   By: Awilda Metro   On: 01/25/2014 00:28   Dg Chest Portable 1 View  01/24/2014   CLINICAL DATA:  Weakness.  History of diabetes.  EXAM: PORTABLE CHEST - 1 VIEW  COMPARISON:  One-view chest 06/27/2013.  FINDINGS: The heart is enlarged. There is no edema or effusion to suggest failure. No focal airspace disease is present. Pacing wires are stable. Advanced degenerative changes in the shoulders is again noted.  IMPRESSION: 1. Cardiomegaly without  failure. 2. No acute cardiopulmonary disease.   Electronically Signed   By: Gennette Pac M.D.   On: 01/24/2014 21:28    Medications: Scheduled Meds: . antiseptic oral rinse  15 mL Mouth Rinse BID  . aspirin EC  81 mg Oral Daily  . atorvastatin  10 mg Oral q1800  . carvedilol  12.5 mg Oral BID WC  . docusate sodium  200 mg Oral BID  . furosemide  20 mg Intravenous Q12H  . HYDROcodone-acetaminophen  1 tablet Oral 4 times per day  . insulin aspart  0-9 Units Subcutaneous TID WC  . isosorbide mononitrate  60 mg Oral Daily  . lactose free nutrition  237 mL Oral TID BM  . levothyroxine  88 mcg Oral QAC breakfast  . pantoprazole  80 mg Oral Daily  . piperacillin-tazobactam (ZOSYN)  IV  3.375 g Intravenous Q8H  . potassium chloride  10 mEq Oral Daily  . sodium chloride  3 mL Intravenous Q12H  . tamsulosin  0.4 mg Oral Daily      LOS: 3 days   RAI,RIPUDEEP M.D. [facility] 01/27/2014, 10:56 AM Pager: [phone]  If 7PM-7AM, please contact night-coverage [email or web address] Password [email or web address]  **Disclaimer: This note was dictated with voice recognition software. Similar sounding words can inadvertently be transcribed and this note may contain transcription errors which may not have been corrected upon publication of note.**

## 2014-01-28 DIAGNOSIS — A498 Other bacterial infections of unspecified site: Secondary | ICD-10-CM

## 2014-01-28 LAB — COMPREHENSIVE METABOLIC PANEL
ALT: 128 U/L — ABNORMAL HIGH (ref 0–35)
AST: 48 U/L — AB (ref 0–37)
Albumin: 3.3 g/dL — ABNORMAL LOW (ref 3.5–5.2)
Alkaline Phosphatase: 144 U/L — ABNORMAL HIGH (ref 39–117)
BILIRUBIN TOTAL: 0.6 mg/dL (ref 0.3–1.2)
BUN: 23 mg/dL (ref 6–23)
CO2: 26 mEq/L (ref 19–32)
Calcium: 9.4 mg/dL (ref 8.4–10.5)
Chloride: 101 mEq/L (ref 96–112)
Creatinine, Ser: 1.34 mg/dL — ABNORMAL HIGH (ref 0.50–1.10)
GFR calc Af Amer: 40 mL/min — ABNORMAL LOW (ref >90)
GFR calc non Af Amer: 35 mL/min — ABNORMAL LOW (ref >90)
GLUCOSE: 91 mg/dL (ref 70–99)
POTASSIUM: 4.2 meq/L (ref 3.7–5.3)
SODIUM: 144 meq/L (ref 137–147)
Total Protein: 6.9 g/dL (ref 6.0–8.3)

## 2014-01-28 LAB — CULTURE, BLOOD (ROUTINE X 2)

## 2014-01-28 LAB — GLUCOSE, CAPILLARY
GLUCOSE-CAPILLARY: 109 mg/dL — AB (ref 70–99)
Glucose-Capillary: 107 mg/dL — ABNORMAL HIGH (ref 70–99)
Glucose-Capillary: 109 mg/dL — ABNORMAL HIGH (ref 70–99)
Glucose-Capillary: 144 mg/dL — ABNORMAL HIGH (ref 70–99)
Glucose-Capillary: 82 mg/dL (ref 70–99)

## 2014-01-28 LAB — CBC
HEMATOCRIT: 34.7 % — AB (ref 36.0–46.0)
HEMOGLOBIN: 11.1 g/dL — AB (ref 12.0–15.0)
MCH: 30.1 pg (ref 26.0–34.0)
MCHC: 32 g/dL (ref 30.0–36.0)
MCV: 94 fL (ref 78.0–100.0)
Platelets: 174 10*3/uL (ref 150–400)
RBC: 3.69 MIL/uL — AB (ref 3.87–5.11)
RDW: 15.4 % (ref 11.5–15.5)
WBC: 5.1 10*3/uL (ref 4.0–10.5)

## 2014-01-28 MED ORDER — FUROSEMIDE 20 MG PO TABS
20.0000 mg | ORAL_TABLET | Freq: Every day | ORAL | Status: DC
  Administered 2014-01-28 – 2014-01-29 (×2): 20 mg via ORAL
  Filled 2014-01-28 (×2): qty 1

## 2014-01-28 MED ORDER — SODIUM CHLORIDE 0.9 % IV SOLN
250.0000 mg | Freq: Two times a day (BID) | INTRAVENOUS | Status: DC
  Administered 2014-01-28 (×2): 250 mg via INTRAVENOUS
  Filled 2014-01-28 (×4): qty 250

## 2014-01-28 MED ORDER — SODIUM CHLORIDE 0.9 % IJ SOLN
10.0000 mL | INTRAMUSCULAR | Status: DC | PRN
  Administered 2014-01-29 (×2): 10 mL

## 2014-01-28 MED ORDER — LEVOTHYROXINE SODIUM 88 MCG PO TABS
88.0000 ug | ORAL_TABLET | Freq: Every day | ORAL | Status: DC
  Administered 2014-01-28 – 2014-01-29 (×2): 88 ug via ORAL
  Filled 2014-01-28 (×4): qty 1

## 2014-01-28 NOTE — Progress Notes (Signed)
Patient ID: [name] [name]  female  WUJ:[id number]    DOB: January 14, 1925    DOA: 01/24/2014  PCP: Josue Hector, MD  Interim summary:  Patient with past medical history of atrial fibrillation, systolic/diastolic heart failure and diet controlled diabetes presents to the emergency room on 6/14 with one-day history of abdominal pain-midepigastric along with associated nausea and vomiting. In the emergency room she was noted to have an elevated temperature of 102.5, elevated heart rate and transaminitis consistent with shock liver. CT scan of the abdomen was actually unremarkable. Patient given 1 dose of IV vancomycin and Zosyn plus IV fluids and admitted to hospitalist service instead.  The patient continued to improve and transferred to floor. Remains afebrile, improvement transaminases and pressure. Blood cultures came back positive for gram-negative rods one bottle. Patient also found to be in acute on chronic heart failure and started on IV diuretics.   Assessment/Plan: Principal Problem:   ESBL Escherichia coli Sepsis: Likely from the UTI, patient reports frequent UTI and been on antibiotics in the last 1 month - Blood cultures 1/2 ESBL Escherichia coli, sensitive to imipenem. DC Zosyn.  - Urine culture had shown 60,000 colonies, however patient reports frequent UTIs, prior history of ESBL UTI - Discussed with Dr. Jerolyn Center, for ID consult, recommended 14 days of imipenem given bacteremia likely due to complicated UTI - Place PICC line today  Active Problems: Abdominal pain with transaminitis and hyperbilirubinemia: Likely may have passed CBD stone, cholangitis picture at the time of admission or shock liver - Patient has no abdominal pain, LFTs are improving, bilirubin normal  - Ultrasound, CT abdomen pelvis done, no acute abdominal pathology    DM - Blood sugars controlled    Atrial fibrillation - Currently stable, amiodarone currently on hold due to transaminitis -  Continue aspirin, Coreg    Acute on chronic combined systolic and diastolic heart failure - Continue Lasix, -10 cc     Hypothyroidism - Continue Synthroid  DVT Prophylaxis:  Code Status: DNR   Family Communication: Discussed in detail with patient's son at the bedside.   Disposition: Hopefully tomorrow to home with home health  Consultants:  Infectious disease, Dr. Ilsa Iha  Procedures:  None   Antibiotics:  IV Zosyn discontinued 6/18  IV imipenem 6/18 >>  Subjective: Tolerating diet, no complaints, son at the bedside  Objective: Weight change: -9.027 kg (-19 lb 14.4 oz)  Intake/Output Summary (Last 24 hours) at 01/28/14 1228 Last data filed at 01/28/14 0900  Gross per 24 hour  Intake    480 ml  Output   2000 ml  Net  -1520 ml   Blood pressure 149/55, pulse 78, temperature 97.6 F (36.4 C), temperature source Oral, resp. rate 18, height 5' 4.17" (1.63 m), weight 48.716 kg (107 lb 6.4 oz), SpO2 99.00%.  Physical Exam: General: Alert and awake, oriented x3, not in any acute distress. CVS: S1-S2 clear, no murmur rubs or gallops Chest: clear to auscultation bilaterally, no wheezing, rales or rhonchi Abdomen: soft nontender, nondistended, normal bowel sounds  Extremities: no cyanosis, clubbing or edema noted bilaterally Neuro: Cranial nerves II-XII intact, no focal neurological deficits  Lab Results: Basic Metabolic Panel:  Recent Labs Lab 01/27/14 0640 01/28/14 0500  NA 142 144  K 3.5* 4.2  CL 102 101  CO2 26 26  GLUCOSE 96 91  BUN 23 23  CREATININE 1.41* 1.34*  CALCIUM 9.0 9.4   Liver Function Tests:  Recent Labs Lab 01/27/14 0640 01/28/14 0500  AST  90* 48*  ALT 175* 128*  ALKPHOS 157* 144*  BILITOT 0.7 0.6  PROT 6.5 6.9  ALBUMIN 3.2* 3.3*    Recent Labs Lab 01/24/14 2054  LIPASE 24   No results found for this basename: AMMONIA,  in the last 168 hours CBC:  Recent Labs Lab 01/24/14 2054  01/27/14 0640 01/28/14 0500  WBC 8.3   < > 4.5 5.1  NEUTROABS 7.4  --   --   --   HGB 12.6  < > 10.3* 11.1*  HCT 38.4  < > 32.5* 34.7*  MCV 91.4  < > 95.0 94.0  PLT 171  < > 159 174  < > = values in this interval not displayed. Cardiac Enzymes: No results found for this basename: CKTOTAL, CKMB, CKMBINDEX, TROPONINI,  in the last 168 hours BNP: No components found with this basename: POCBNP,  CBG:  Recent Labs Lab 01/27/14 1200 01/27/14 1641 01/27/14 2151 01/28/14 0739 01/28/14 1117  GLUCAP 139* 129* 107* 109* 109*     Micro Results: Recent Results (from the past 240 hour(s))  URINE CULTURE     Status: None   Collection Time    01/24/14  8:57 PM      Result Value Ref Range Status   Specimen Description URINE, CLEAN CATCH   Final   Special Requests NONE   Final   Culture  Setup Time     Final   Value: 01/25/2014 00:57     Performed at [facility]   Colony Count     Final   Value: 60,000 COLONIES/ML     Performed at [facility]   Culture     Final   Value: Multiple bacterial morphotypes present, none predominant. Suggest appropriate recollection if clinically indicated.     Performed at [facility]   Report Status 01/25/2014 FINAL   Final  CULTURE, BLOOD (ROUTINE X 2)     Status: None   Collection Time    01/24/14  9:11 PM      Result Value Ref Range Status   Specimen Description BLOOD RIGHT ANTECUBITAL   Final   Special Requests BOTTLES DRAWN AEROBIC AND ANAEROBIC 10CC EACH   Final   Culture  Setup Time     Final   Value: 01/25/2014 00:45     Performed at [facility]   Culture     Final   Value:        BLOOD CULTURE RECEIVED NO GROWTH TO DATE CULTURE WILL BE HELD FOR 5 DAYS BEFORE ISSUING A FINAL NEGATIVE REPORT     Performed at [facility]   Report Status PENDING   Incomplete  CULTURE, BLOOD (ROUTINE X 2)     Status: None   Collection Time    01/24/14  9:15 PM      Result Value Ref Range Status   Specimen Description BLOOD RIGHT HAND   Final   Special  Requests BOTTLES DRAWN AEROBIC ONLY 6CC   Final   Culture  Setup Time     Final   Value: 01/25/2014 00:45     Performed at [facility]   Culture     Final   Value: ESCHERICHIA COLI     Note: Confirmed Extended Spectrum Beta-Lactamase Producer (ESBL) CRITICAL RESULT CALLED TO, READ BACK BY AND VERIFIED WITH: WENDY G@7 :40AM ON 01/28/14 BY DANTS     Note: Gram Stain Report Called to,Read Back By and Verified With: Elsie LincolnKATHY WICKER 01/26/14 1020 BY  SMITHERSJ     Performed at [facility]   Report Status 01/28/2014 FINAL   Final   Organism ID, Bacteria ESCHERICHIA COLI   Final  MRSA PCR SCREENING     Status: None   Collection Time    01/25/14  5:40 AM      Result Value Ref Range Status   MRSA by PCR NEGATIVE  NEGATIVE Final   Comment:            The GeneXpert MRSA Assay (FDA     approved for NASAL specimens     only), is one component of a     comprehensive MRSA colonization     surveillance program. It is not     intended to diagnose MRSA     infection nor to guide or     monitor treatment for     MRSA infections.  CULTURE, BLOOD (ROUTINE X 2)     Status: None   Collection Time    01/27/14  3:30 PM      Result Value Ref Range Status   Specimen Description BLOOD RIGHT FOREARM   Final   Special Requests BOTTLES DRAWN AEROBIC ONLY 3CC   Final   Culture  Setup Time     Final   Value: 01/27/2014 22:48     Performed at [facility]   Culture     Final   Value:        BLOOD CULTURE RECEIVED NO GROWTH TO DATE CULTURE WILL BE HELD FOR 5 DAYS BEFORE ISSUING A FINAL NEGATIVE REPORT     Performed at [facility]   Report Status PENDING   Incomplete  CULTURE, BLOOD (ROUTINE X 2)     Status: None   Collection Time    01/27/14  3:40 PM      Result Value Ref Range Status   Specimen Description BLOOD RIGHT HAND   Final   Special Requests BOTTLES DRAWN AEROBIC ONLY 2CC   Final   Culture  Setup Time     Final   Value: 01/27/2014 22:48     Performed at [facility]   Culture     Final   Value:        BLOOD CULTURE RECEIVED NO GROWTH TO DATE CULTURE WILL BE HELD FOR 5 DAYS BEFORE ISSUING A FINAL NEGATIVE REPORT     Performed at [facility]   Report Status PENDING   Incomplete    Studies/Results: Ct Abdomen Pelvis Wo Contrast  01/24/2014   CLINICAL DATA:  Lethargy and weakness.  Epigastric abdominal pain.  EXAM: CT ABDOMEN AND PELVIS WITHOUT CONTRAST  TECHNIQUE: Multidetector CT imaging of the abdomen and pelvis was performed following the standard protocol without IV contrast.  COMPARISON:  Pelvic ultrasound performed 12/02/2012, and CT of the abdomen and pelvis performed 07/24/2010  FINDINGS: The visualized lung bases are clear. The patient is status post median sternotomy. Pacemaker leads are partially imaged. Scattered coronary artery calcifications are seen.  Prominence of the intrahepatic biliary ducts likely remains within normal limits status post cholecystectomy. Clips are noted along the gallbladder fossa. The liver and spleen are otherwise unremarkable in appearance. The pancreas and left adrenal gland are unremarkable. A 1.6 cm rounded density at the right adrenal gland is nonspecific in appearance, but has increased only mildly in size from 2011, and is likely benign.  The kidneys are unremarkable in appearance. There is no evidence of hydronephrosis. Slight prominence of both ureters remains  within normal limits. There is no evidence of distal obstruction. A calcification adjacent to the left vesicoureteral junction is stable from 2011 and likely vascular in nature. No renal or ureteral stones are seen. Mild nonspecific perinephric stranding is noted bilaterally. Bilateral extrarenal pelves are seen.  No free fluid is identified. The small bowel is unremarkable in appearance. The stomach is within normal limits. No acute vascular abnormalities are seen.  There is ectasia of the infrarenal abdominal aorta, measuring 2.9 cm in maximal  transverse dimension and 2.3 cm in AP dimension. There is no evidence of aneurysmal dilatation. Diffuse calcification is seen along the abdominal aorta and its branches. Stents are noted within both proximal renal arteries, and there is relatively diffuse calcification along the superior mesenteric artery.  The appendix is not definitely seen; there is no evidence for appendicitis. Minimal diverticulosis is noted along the mid sigmoid colon, without evidence of diverticulitis. Dense stool is seen within the rectum.  The bladder is significantly distended and grossly unremarkable in appearance. The uterus is within normal limits, aside from scattered calcification. The ovaries are relatively symmetric. No suspicious adnexal masses are seen. No inguinal lymphadenopathy is seen. Postoperative change is noted at the right inguinal region.  No acute osseous abnormalities are identified. Vacuum phenomenon and disc space narrowing are seen at L4-L5.  IMPRESSION: 1. No definite acute abnormalities seen in the abdomen or pelvis. 2. Diffuse calcification along the abdominal aorta and its branches, with relatively diffuse calcification involving the superior mesenteric artery. Stents within both proximal renal arteries are grossly unremarkable in appearance. Ectasia of the infrarenal abdominal aorta, without evidence of aneurysmal dilatation. 3. Minimal diverticulosis along the mid sigmoid colon, without evidence of diverticulitis. 4. Scattered coronary artery calcifications seen. 5. 1.6 cm rounded density at the right adrenal gland is nonspecific, but is increased only mildly in size from 2011, and is likely benign.   Electronically Signed   By: Roanna Raider M.D.   On: 01/24/2014 22:10   US Abdomen Complete  01/25/2014   CLINICAL DATA:  Upper abdominal pain, elevated liver function tests. Status post cholecystectomy.  EXAM: ULTRASOUND ABDOMEN COMPLETE  COMPARISON:  CT of the abdomen and pelvis January 24, 2014 at 2152 hr   FINDINGS: Gallbladder:  Surgically absent.  Common bile duct:  Diameter: 8 mm, no sonographic findings of choledocholithiasis.  Liver:  Mildly heterogeneous echotexture may reflect fatty infiltration without intrahepatic biliary dilatation. No perihepatic free fluid. Hepatopetal portal vein.  IVC:  No abnormality visualized.  Pancreas:  Visualized portion unremarkable.  Spleen:  Size and appearance within normal limits.  Right Kidney:  Length: 8.5 cm. Echogenicity within normal limits. No mass or hydronephrosis visualized. Mild thinning of the renal cortex. Extra renal pelvis.  Left Kidney:  Length: 10.3 cm. Echogenicity within normal limits. 3.7 x 3.2 x 2.9 cm air anechoic cyst in upper pole left kidney as seen on prior CT. No hydronephrosis visualized. Mild thinning of the renal cortex.  Abdominal aorta: Limited assessment due to bowel gas and presumed calcific atherosclerosis ; ectatic aorta is seen on CT of abdomen and pelvis from same day, reported separately.  No aneurysm visualized.  Other findings:  None.  IMPRESSION: No acute abdominal process by sonography. Status post cholecystectomy.  Mild symmetric cortical renal atrophy.   Electronically Signed   By: Awilda Metro   On: 01/25/2014 00:28   Dg Chest Portable 1 View  01/24/2014   CLINICAL DATA:  Weakness.  History of diabetes.  EXAM: PORTABLE  CHEST - 1 VIEW  COMPARISON:  One-view chest 06/27/2013.  FINDINGS: The heart is enlarged. There is no edema or effusion to suggest failure. No focal airspace disease is present. Pacing wires are stable. Advanced degenerative changes in the shoulders is again noted.  IMPRESSION: 1. Cardiomegaly without failure. 2. No acute cardiopulmonary disease.   Electronically Signed   By: Gennette Pac M.D.   On: 01/24/2014 21:28    Medications: Scheduled Meds: . antiseptic oral rinse  15 mL Mouth Rinse BID  . aspirin EC  81 mg Oral Daily  . atorvastatin  10 mg Oral q1800  . carvedilol  12.5 mg Oral BID WC  .  docusate sodium  200 mg Oral BID  . feeding supplement (ENSURE COMPLETE)  237 mL Oral BID BM  . furosemide  20 mg Oral Daily  . HYDROcodone-acetaminophen  1 tablet Oral 4 times per day  . imipenem-cilastatin  250 mg Intravenous Q12H  . insulin aspart  0-9 Units Subcutaneous TID WC  . isosorbide mononitrate  60 mg Oral Daily  . levothyroxine  88 mcg Oral QAC breakfast  . pantoprazole  80 mg Oral Daily  . potassium chloride  10 mEq Oral Daily  . sodium chloride  3 mL Intravenous Q12H  . tamsulosin  0.4 mg Oral Daily      LOS: 4 days   Areyana Leoni M.D. [facility] 01/28/2014, 12:28 PM Pager: [phone]  If 7PM-7AM, please contact night-coverage [email or web address] Password [email or web address]  **Disclaimer: This note was dictated with voice recognition software. Similar sounding words can inadvertently be transcribed and this note may contain transcription errors which may not have been corrected upon publication of note.**

## 2014-01-28 NOTE — Progress Notes (Signed)
Peripherally Inserted Central Catheter/Midline Placement  The IV Nurse has discussed with the patient and/or persons authorized to consent for the patient, the purpose of this procedure and the potential benefits and risks involved with this procedure.  The benefits include less needle sticks, lab draws from the catheter and patient may be discharged home with the catheter.  Risks include, but not limited to, infection, bleeding, blood clot (thrombus formation), and puncture of an artery; nerve damage and irregular heat beat.  Alternatives to this procedure were also discussed.  PICC/Midline Placement Documentation        [name] [name] 01/28/2014, 4:15 PM

## 2014-01-28 NOTE — Plan of Care (Signed)
Problem: Consults Goal: Nutrition Consult-if indicated Outcome: Completed/Met Date Met:  01/28/14 Dietitian consult. Ensure ordered.  Goal: Diabetes Guidelines if Diabetic/Glucose > 140 If diabetic or lab glucose is > 140 mg/dl - Initiate Diabetes/Hyperglycemia Guidelines & Document Interventions  Outcome: Not Applicable Date Met:  53/91/22 Monitoring cbg achs. Not requiring ssi at this time.   Problem: Phase I Progression Outcomes Goal: Pain controlled with appropriate interventions Outcome: Completed/Met Date Met:  01/28/14 Chronic L shoulder pain controlled with hydrocodone/apap TID. Goal: OOB as tolerated unless otherwise ordered Outcome: Completed/Met Date Met:  01/28/14 Ambulates to bathroom with standby assist. TID this morning.  Goal: Initial discharge plan identified Outcome: Completed/Met Date Met:  01/28/14 Pt to return home with family nearby, and [facility] for IV antibiotics.

## 2014-01-28 NOTE — Consult Note (Signed)
[facility]  Total days of antibiotics 5        Day 1 imipenemd        (previously on piptazo)       Reason for Consult: esbl ecoli bacteremia    Referring Physician: Tana Coast  Principal Problem:   Sepsis Active Problems:   DM   Atrial fibrillation   Acute on chronic combined systolic and diastolic heart failure   Hypothyroidism   Transaminitis   Hyperbilirubinemia   Abdominal pain    HPI: [name] [name] is a 80 y.o. female with DM, CAD, HTN, HLD, previous chole, presented with acute onset of abdominal pain found to be febrile to 102.26F, no leukocytosis but left shift of neutrophils. She did have transaminitis of ast 7171 and ALT 356, tbili 3.5 thought to be due hypotension/shock, which have improved. Viral hepatitis inc cmv ruled out. She was started on piptazo for possible gi source of infection. She underwent abd/pelvis CT which was unremarkable. Her blood cx then grew out ESBL Ecoli in 1 of 2 sets. Etiology of ecoli bacteremia thought to be gi source since she denied having dysuria, ua is clean at time of admit, but she is colonized with ESBL ecoli from prior utis. She remains afebrile. Repeat blood cx on 6/17 were drawn, however she was on piptazo at the time. Now on appropriate abtx of imipenem   Past Medical History  Diagnosis Date  . Diabetes mellitus without complication   . Chest pain   . DJD (degenerative joint disease)   . Tachycardia-bradycardia syndrome   . CKD (chronic kidney disease)   . Cardiomegaly   . Coronary artery disease     a. LAD stent 1999 after NSTEMI. b. CABG 2004. c. Last LHC 02/2010 - patent grafts.  . Hypertension   . Hyperlipidemia   . Carotid artery disease     status post right carotid endarterectomy.  . Degenerative joint disease   . Restless leg syndrome   . Chronic renal insufficiency   . Heparin induced thrombocytopenia   . Diphtheria     "as a child"  . Pleural effusion, bilateral 06/2003  . Pseudoaneurysm       status post repair following catherization in 1999  . Chronic systolic heart failure 3/90/30  . Ischemic cardiomyopathy     Echocardiogram 10/01/11: Mild LVH, EF 35-40%, basal inferior, basal to mid posterior and basal to mid anterolateral severe hypokinesis, mild to moderate AI, moderate MR (ischemic MR), moderate LAE, mild RVE, mildly reduced RV systolic function, mild RAE, PASP 43-44  . COPD (chronic obstructive pulmonary disease)   . Atrial fibrillation/flutter     s/p multiple DCCVs;  amiodarone started 09/2011, TEE DCCV 3/13;  amio and coumadin d/c'd after adm[facility]n [facility] 02/2012 with frequent falls/syncope  . Tachycardia-bradycardia syndrome     s/p Medtronic pacemaker implant 09/2011 (8 sec pause noted)  . GERD (gastroesophageal reflux disease)   . Renal artery stenosis     bilateral- status post stenting  . Non-functioning kidney   . Depression     "cries alot"  . Gout   . Hypothyroidism   . Anemia     Allergies:  Allergies  Allergen Reactions  . Heparin Other (See Comments)    Clots blood instead of thinning  . Hydromorphone Hcl Other (See Comments)    Crazy feeling  . Warfarin And Related Other (See Comments)    Syncope events  . Altace [Ramipril] Hives  This is not a listed allergy on MAR provided by the [facility]  . Contrast Media [Iodinated Diagnostic Agents] Other (See Comments)    Reaction noted by md-unknown reaction per family  . Morphine And Related Nausea And Vomiting  . Plavix [Clopidogrel Bisulfate] Other (See Comments)    unknown     MEDICATIONS: . antiseptic oral rinse  15 mL Mouth Rinse BID  . aspirin EC  81 mg Oral Daily  . atorvastatin  10 mg Oral q1800  . carvedilol  12.5 mg Oral BID WC  . docusate sodium  200 mg Oral BID  . feeding supplement (ENSURE COMPLETE)  237 mL Oral BID BM  . furosemide  20 mg Oral Daily  . HYDROcodone-acetaminophen  1 tablet Oral 4 times per day  . imipenem-cilastatin  250 mg Intravenous Q12H  . insulin aspart   0-9 Units Subcutaneous TID WC  . isosorbide mononitrate  60 mg Oral Daily  . levothyroxine  88 mcg Oral QAC breakfast  . pantoprazole  80 mg Oral Daily  . potassium chloride  10 mEq Oral Daily  . sodium chloride  3 mL Intravenous Q12H  . tamsulosin  0.4 mg Oral Daily    History  Substance Use Topics  . Smoking status: Never Smoker   . Smokeless tobacco: Never Used  . Alcohol Use: No    Family History  Problem Relation Age of Onset  . Cancer Mother 46    died  . Lung disease Father 30    died  . Heart disease Sister     2 sisters and her son  . Anesthesia problems Neg Hx     Review of Systems  Constitutional: Negative for fever, chills, diaphoresis, activity change, appetite change, fatigue and unexpected weight change.  HENT: Negative for congestion, sore throat, rhinorrhea, sneezing, trouble swallowing and sinus pressure.  Eyes: Negative for photophobia and visual disturbance.  Respiratory: Negative for cough, chest tightness, shortness of breath, wheezing and stridor.  Cardiovascular: Negative for chest pain, palpitations and leg swelling.  Gastrointestinal: + constipation. Negative for nausea, vomiting, abdominal pain, diarrhea, constipation, blood in stool, abdominal distention and anal bleeding.  Genitourinary: Negative for dysuria, hematuria, flank pain and difficulty urinating.  Musculoskeletal: Negative for myalgias, back pain, joint swelling, arthralgias and gait problem.  Skin: Negative for color change, pallor, rash and wound.  Neurological: Negative for dizziness, tremors, weakness and light-headedness.  Hematological: Negative for adenopathy. Does not bruise/bleed easily.  Psychiatric/Behavioral: Negative for behavioral problems, confusion, sleep disturbance, dysphoric mood, decreased concentration and agitation.     OBJECTIVE: Temp:  [97.6 F (36.4 C)-97.8 F (36.6 C)] 97.6 F (36.4 C) (06/18 1008) Pulse Rate:  [69-82] 78 (06/18 1008) Resp:  [17-19] 18  (06/18 1008) BP: (114-149)/(55-70) 149/55 mmHg (06/18 1008) SpO2:  [95 %-99 %] 99 % (06/18 1008) Weight:  [107 lb 6.4 oz (48.716 kg)] 107 lb 6.4 oz (48.716 kg) (06/17 2152)  Constitutional:  oriented to person, place, and time. appears well-developed and well-nourished. Appears her states age. No distress.  HENT:  Mouth/Throat: Oropharynx is clear and moist. No oropharyngeal exudate.  Cardiovascular: Normal rate, regular rhythm and normal heart sounds. Exam reveals no gallop and no friction rub.  No murmur heard.  Pulmonary/Chest: Effort normal and breath sounds normal. No respiratory distress.  has no wheezes.  Abdominal: Soft. Bowel sounds are normal.  exhibits no distension. There is no tenderness.  Lymphadenopathy: no cervical adenopathy.  Neurological: alert and oriented to person, place, and time.  Skin: Skin is warm and dry. Thin. Scattered ecchymosis on upper extremities Ext: right arm picc line in place Psychiatric: a normal mood and affect. behavior is normal.   LABS: Results for orders placed during the hospital encounter of 01/24/14 (from the past 48 hour(s))  HEMOGLOBIN A1C     Status: Abnormal   Collection Time    01/26/14  7:32 PM      Result Value Ref Range   Hemoglobin A1C 6.7 (*) <5.7 %   Comment: (NOTE)                                                                               According to the ADA Clinical Practice Recommendations for 2011, when     HbA1c is used as a screening test:      >=6.5%   Diagnostic of Diabetes Mellitus               (if abnormal result is confirmed)     5.7-6.4%   Increased risk of developing Diabetes Mellitus     References:Diagnosis and Classification of Diabetes Mellitus,Diabetes     LMBE,6754,49(EEFEO 1):S62-S69 and Standards of Medical Care in             Diabetes - 2011,Diabetes Care,2011,34 (Suppl 1):S11-S61.   Mean Plasma Glucose 146 (*) <117 mg/dL   Comment: Performed at [place], [facility]     Status:  Abnormal   Collection Time    01/26/14  9:46 PM      Result Value Ref Range   Glucose-Capillary 115 (*) 70 - 99 mg/dL  COMPREHENSIVE METABOLIC PANEL     Status: Abnormal   Collection Time    01/27/14  6:40 AM      Result Value Ref Range   Sodium 142  137 - 147 mEq/L   Potassium 3.5 (*) 3.7 - 5.3 mEq/L   Chloride 102  96 - 112 mEq/L   CO2 26  19 - 32 mEq/L   Glucose, Bld 96  70 - 99 mg/dL   BUN 23  6 - 23 mg/dL   Creatinine, Ser 1.41 (*) 0.50 - 1.10 mg/dL   Calcium 9.0  8.4 - 10.5 mg/dL   Total Protein 6.5  6.0 - 8.3 g/dL   Albumin 3.2 (*) 3.5 - 5.2 g/dL   AST 90 (*) 0 - 37 U/L   ALT 175 (*) 0 - 35 U/L   Alkaline Phosphatase 157 (*) 39 - 117 U/L   Total Bilirubin 0.7  0.3 - 1.2 mg/dL   GFR calc non Af Amer 33 (*) >90 mL/min   GFR calc Af Amer 38 (*) >90 mL/min   Comment: (NOTE)     The eGFR has been calculated using the CKD EPI equation.     This calculation has not been validated in all clinical situations.     eGFR's persistently <90 mL/min signify possible Chronic Kidney     Disease.  CBC     Status: Abnormal   Collection Time    01/27/14  6:40 AM      Result Value Ref Range   WBC 4.5  4.0 - 10.5 K/uL   RBC 3.42 (*) 3.87 -  5.11 MIL/uL   Hemoglobin 10.3 (*) 12.0 - 15.0 g/dL   HCT 32.5 (*) 36.0 - 46.0 %   MCV 95.0  78.0 - 100.0 fL   MCH 30.1  26.0 - 34.0 pg   MCHC 31.7  30.0 - 36.0 g/dL   RDW 15.4  11.5 - 15.5 %   Platelets 159  150 - 400 K/uL  GLUCOSE, CAPILLARY     Status: Abnormal   Collection Time    01/27/14  7:59 AM      Result Value Ref Range   Glucose-Capillary 112 (*) 70 - 99 mg/dL  GLUCOSE, CAPILLARY     Status: Abnormal   Collection Time    01/27/14 12:00 PM      Result Value Ref Range   Glucose-Capillary 139 (*) 70 - 99 mg/dL  CULTURE, BLOOD (ROUTINE X 2)     Status: None   Collection Time    01/27/14  3:30 PM      Result Value Ref Range   Specimen Description BLOOD RIGHT FOREARM     Special Requests BOTTLES DRAWN AEROBIC ONLY 3CC     Culture   Setup Time       Value: 01/27/2014 22:48     Performed at [facility]   Culture       Value:        BLOOD CULTURE RECEIVED NO GROWTH TO DATE CULTURE WILL BE HELD FOR 5 DAYS BEFORE ISSUING A FINAL NEGATIVE REPORT     Performed at [facility]   Report Status PENDING    CULTURE, BLOOD (ROUTINE X 2)     Status: None   Collection Time    01/27/14  3:40 PM      Result Value Ref Range   Specimen Description BLOOD RIGHT HAND     Special Requests BOTTLES DRAWN AEROBIC ONLY 2CC     Culture  Setup Time       Value: 01/27/2014 22:48     Performed at [facility]   Culture       Value:        BLOOD CULTURE RECEIVED NO GROWTH TO DATE CULTURE WILL BE HELD FOR 5 DAYS BEFORE ISSUING A FINAL NEGATIVE REPORT     Performed at [facility]   Report Status PENDING    GLUCOSE, CAPILLARY     Status: Abnormal   Collection Time    01/27/14  4:41 PM      Result Value Ref Range   Glucose-Capillary 129 (*) 70 - 99 mg/dL  GLUCOSE, CAPILLARY     Status: Abnormal   Collection Time    01/27/14  9:51 PM      Result Value Ref Range   Glucose-Capillary 107 (*) 70 - 99 mg/dL  CBC     Status: Abnormal   Collection Time    01/28/14  5:00 AM      Result Value Ref Range   WBC 5.1  4.0 - 10.5 K/uL   RBC 3.69 (*) 3.87 - 5.11 MIL/uL   Hemoglobin 11.1 (*) 12.0 - 15.0 g/dL   HCT 34.7 (*) 36.0 - 46.0 %   MCV 94.0  78.0 - 100.0 fL   MCH 30.1  26.0 - 34.0 pg   MCHC 32.0  30.0 - 36.0 g/dL   RDW 15.4  11.5 - 15.5 %   Platelets 174  150 - 400 K/uL  COMPREHENSIVE METABOLIC PANEL     Status: Abnormal   Collection Time  01/28/14  5:00 AM      Result Value Ref Range   Sodium 144  137 - 147 mEq/L   Potassium 4.2  3.7 - 5.3 mEq/L   Chloride 101  96 - 112 mEq/L   CO2 26  19 - 32 mEq/L   Glucose, Bld 91  70 - 99 mg/dL   BUN 23  6 - 23 mg/dL   Creatinine, Ser 1.34 (*) 0.50 - 1.10 mg/dL   Calcium 9.4  8.4 - 10.5 mg/dL   Total Protein 6.9  6.0 - 8.3 g/dL   Albumin 3.3 (*) 3.5 - 5.2 g/dL     AST 48 (*) 0 - 37 U/L   ALT 128 (*) 0 - 35 U/L   Alkaline Phosphatase 144 (*) 39 - 117 U/L   Total Bilirubin 0.6  0.3 - 1.2 mg/dL   GFR calc non Af Amer 35 (*) >90 mL/min   GFR calc Af Amer 40 (*) >90 mL/min   Comment: (NOTE)     The eGFR has been calculated using the CKD EPI equation.     This calculation has not been validated in all clinical situations.     eGFR's persistently <90 mL/min signify possible Chronic Kidney     Disease.  GLUCOSE, CAPILLARY     Status: Abnormal   Collection Time    01/28/14  7:39 AM      Result Value Ref Range   Glucose-Capillary 109 (*) 70 - 99 mg/dL  GLUCOSE, CAPILLARY     Status: Abnormal   Collection Time    01/28/14 11:17 AM      Result Value Ref Range   Glucose-Capillary 109 (*) 70 - 99 mg/dL    MICRO: 6/14 blood cx 1 of 2 ESBL ecoli 6/14 urine cx mixed flora 6/17 blood cx pending IMAGING:abd ct 6/14 The visualized lung bases are clear. The patient is status post  median sternotomy. Pacemaker leads are partially imaged. Scattered  coronary artery calcifications are seen.  Prominence of the intrahepatic biliary ducts likely remains within  normal limits status post cholecystectomy. Clips are noted along the  gallbladder fossa. The liver and spleen are otherwise unremarkable  in appearance. The pancreas and left adrenal gland are unremarkable.  A 1.6 cm rounded density at the right adrenal gland is nonspecific  in appearance, but has increased only mildly in size from 2011, and  is likely benign.  The kidneys are unremarkable in appearance. There is no evidence of  hydronephrosis. Slight prominence of both ureters remains within  normal limits. There is no evidence of distal obstruction. A  calcification adjacent to the left vesicoureteral junction is stable  from 2011 and likely vascular in nature. No renal or ureteral stones  are seen. Mild nonspecific perinephric stranding is noted  bilaterally. Bilateral extrarenal pelves are seen.   No free fluid is identified. The small bowel is unremarkable in  appearance. The stomach is within normal limits. No acute vascular  abnormalities are seen.  There is ectasia of the infrarenal abdominal aorta, measuring 2.9 cm  in maximal transverse dimension and 2.3 cm in AP dimension. There is  no evidence of aneurysmal dilatation. Diffuse calcification is seen  along the abdominal aorta and its branches. Stents are noted within  both proximal renal arteries, and there is relatively diffuse  calcification along the superior mesenteric artery.  The appendix is not definitely seen; there is no evidence for  appendicitis. Minimal diverticulosis is noted along the mid sigmoid  colon,  without evidence of diverticulitis. Dense stool is seen  within the rectum.  The bladder is significantly distended and grossly unremarkable in  appearance. The uterus is within normal limits, aside from scattered  calcification. The ovaries are relatively symmetric. No suspicious  adnexal masses are seen. No inguinal lymphadenopathy is seen.  Postoperative change is noted at the right inguinal region.  No acute osseous abnormalities are identified. Vacuum phenomenon and  disc space narrowing are seen at L4-L5.  IMPRESSION:  1. No definite acute abnormalities seen in the abdomen or pelvis.  2. Diffuse calcification along the abdominal aorta and its branches,  with relatively diffuse calcification involving the superior  mesenteric artery. Stents within both proximal renal arteries are  grossly unremarkable in appearance. Ectasia of the infrarenal  abdominal aorta, without evidence of aneurysmal dilatation.  3. Minimal diverticulosis along the mid sigmoid colon, without  evidence of diverticulitis.  4. Scattered coronary artery calcifications seen.  5. 1.6 cm rounded density at the right adrenal gland is nonspecific,  but is increased only mildly in size from 2011, and is likely   benign.  Assessment/Plan:  80yo F with esbl ecoli bacteremia thought to be due to GI source  - recommend to treat with a carbapenem x 14 days using 6/18 as day 1. Can keep in imipenem while hospitalized and switch to ertapenem which is a once a day infusion - if she has ongoing bacteremia, recommend to get TEE, although somewhat rare to have GNR endocarditis - if she has recurrent bacteremia, it may be due to being on piptazo which was not effective ag. esbl mdro.  Elzie Rings [facility] [phone]

## 2014-01-29 DIAGNOSIS — R269 Unspecified abnormalities of gait and mobility: Secondary | ICD-10-CM

## 2014-01-29 DIAGNOSIS — R7881 Bacteremia: Secondary | ICD-10-CM

## 2014-01-29 DIAGNOSIS — I5023 Acute on chronic systolic (congestive) heart failure: Secondary | ICD-10-CM

## 2014-01-29 DIAGNOSIS — I5043 Acute on chronic combined systolic (congestive) and diastolic (congestive) heart failure: Secondary | ICD-10-CM

## 2014-01-29 DIAGNOSIS — I509 Heart failure, unspecified: Secondary | ICD-10-CM

## 2014-01-29 LAB — COMPREHENSIVE METABOLIC PANEL
ALBUMIN: 3.2 g/dL — AB (ref 3.5–5.2)
ALT: 89 U/L — AB (ref 0–35)
AST: 32 U/L (ref 0–37)
Alkaline Phosphatase: 133 U/L — ABNORMAL HIGH (ref 39–117)
BILIRUBIN TOTAL: 0.5 mg/dL (ref 0.3–1.2)
BUN: 26 mg/dL — ABNORMAL HIGH (ref 6–23)
CHLORIDE: 96 meq/L (ref 96–112)
CO2: 26 meq/L (ref 19–32)
Calcium: 9.6 mg/dL (ref 8.4–10.5)
Creatinine, Ser: 1.41 mg/dL — ABNORMAL HIGH (ref 0.50–1.10)
GFR calc Af Amer: 38 mL/min — ABNORMAL LOW (ref >90)
GFR, EST NON AFRICAN AMERICAN: 33 mL/min — AB (ref >90)
Glucose, Bld: 105 mg/dL — ABNORMAL HIGH (ref 70–99)
Potassium: 3.8 mEq/L (ref 3.7–5.3)
Sodium: 137 mEq/L (ref 137–147)
Total Protein: 6.8 g/dL (ref 6.0–8.3)

## 2014-01-29 LAB — GLUCOSE, CAPILLARY
Glucose-Capillary: 102 mg/dL — ABNORMAL HIGH (ref 70–99)
Glucose-Capillary: 156 mg/dL — ABNORMAL HIGH (ref 70–99)

## 2014-01-29 LAB — CBC
HCT: 35.1 % — ABNORMAL LOW (ref 36.0–46.0)
Hemoglobin: 11.3 g/dL — ABNORMAL LOW (ref 12.0–15.0)
MCH: 29.7 pg (ref 26.0–34.0)
MCHC: 32.2 g/dL (ref 30.0–36.0)
MCV: 92.4 fL (ref 78.0–100.0)
PLATELETS: 161 10*3/uL (ref 150–400)
RBC: 3.8 MIL/uL — AB (ref 3.87–5.11)
RDW: 15.3 % (ref 11.5–15.5)
WBC: 6.6 10*3/uL (ref 4.0–10.5)

## 2014-01-29 MED ORDER — ALPRAZOLAM 0.25 MG PO TABS: 0.2500 mg | ORAL_TABLET | Freq: Two times a day (BID) | ORAL | Status: DC | PRN

## 2014-01-29 MED ORDER — SODIUM CHLORIDE 0.9 % IJ SOLN: 10.0000 mL | INTRAMUSCULAR | Status: DC | PRN

## 2014-01-29 MED ORDER — SODIUM CHLORIDE 0.9 % IV SOLN: 500.0000 mg | INTRAVENOUS | Status: DC

## 2014-01-29 MED ORDER — FUROSEMIDE 40 MG PO TABS: 20.0000 mg | ORAL_TABLET | Freq: Every day | ORAL | Status: DC

## 2014-01-29 MED ORDER — SODIUM CHLORIDE 0.9 % IV SOLN
500.0000 mg | INTRAVENOUS | Status: DC
  Administered 2014-01-29: 0.5 g via INTRAVENOUS
  Filled 2014-01-29: qty 0.5

## 2014-01-29 NOTE — Care Management Note (Signed)
CARE MANAGEMENT NOTE 01/29/2014  Patient:  [name] [name], [name] [name]   Account Number:  [id number]  Date Initiated:  01/25/2014  Documentation initiated by:  Junius Creamer  Subjective/Objective Assessment:   adm w sepsis     Action/Plan:   lives alone, pcp dr Darcel Bayley [place]   Anticipated DC Date:  01/29/2014   Anticipated DC Plan:  HOME W HOME HEALTH SERVICES         [facility] Choice  HOME HEALTH   Choice offered to / List presented to:  C-1 Patient   DME arranged  IV PUMP/EQUIPMENT      DME agency  [facility]     [facility] arranged  HH-1 RN  HH-2 PT  HH-3 OT  HH-4 NURSE'S AIDE      HH agency  [facility]   Status of service:  Completed, signed off Medicare Important Message given?  YES (If response is "NO", the following Medicare IM given date fields will be blank) Date Medicare IM given:   Date Additional Medicare IM given:  01/29/2014  Discharge Disposition:  HOME W HOME HEALTH SERVICES  Per UR Regulation:  Reviewed for med. necessity/level of care/duration of stay  If discussed at Long Length of Stay Meetings, dates discussed:    Comments:  01/29/2014  1000  Darlyne Russian RN, [place] [phone] CM referral:HHRN.PT,OT aide, IV antibiotics  Patient selected [facility] for services [facility] called with referral plan for discharge today

## 2014-01-29 NOTE — Progress Notes (Signed)
Pt discharged home. Midline capped and flushed by IV team. [facility] discussed with pt and family about IV home antibiotics. Pt escorted out via wheelchair and volunteer. Hortencia Conradi, RN

## 2014-01-29 NOTE — Discharge Summary (Signed)
Physician Discharge Summary  Patient ID: [name] [name] MRN: [id number] DOB/AGE: October 19, 1924 80 y.o.  Admit date: 01/24/2014 Discharge date: 01/29/2014  Primary Care Physician:  Josue Hector, MD  Discharge Diagnoses:    . Sepsises ESBL ecoli bacteremia  . Transaminitis . Hyperbilirubinemia . Abdominal pain . Atrial fibrillation . Hypothyroidism . DM . Acute on chronic combined systolic and diastolic heart failure   History ESBL ecoli UTI colonization   Consults:  ID: Dr Jerolyn Center   Recommendations for Outpatient Follow-up:  Ertepenem 0.5g IV daily x 12 more days (stop date 02/10/14) for total 14 days   Allergies:   Allergies  Allergen Reactions  . Heparin Other (See Comments)    Clots blood instead of thinning  . Hydromorphone Hcl Other (See Comments)    Crazy feeling  . Warfarin And Related Other (See Comments)    Syncope events  . Altace [Ramipril] Hives    This is not a listed allergy on MAR provided by the [facility]  . Contrast Media [Iodinated Diagnostic Agents] Other (See Comments)    Reaction noted by md-unknown reaction per family  . Morphine And Related Nausea And Vomiting  . Plavix [Clopidogrel Bisulfate] Other (See Comments)    unknown     Discharge Medications:   Medication List         ALPRAZolam 0.25 MG tablet  Commonly known as:  XANAX  Take 1 tablet (0.25 mg total) by mouth 2 (two) times daily as needed for anxiety.     amiodarone 200 MG tablet  Commonly known as:  PACERONE  Take 100 mg by mouth daily.     aspirin EC 81 MG tablet  Take 81 mg by mouth daily.     atorvastatin 10 MG tablet  Commonly known as:  LIPITOR  Take 1 tablet (10 mg total) by mouth daily at 6 PM.     carvedilol 12.5 MG tablet  Commonly known as:  COREG  Take 12.5 mg by mouth 2 (two) times daily with a meal.     Cholecalciferol 1000 UNITS capsule  Take 1,000 Units by mouth daily.     cyanocobalamin 1000 MCG/ML injection  Commonly known as:   (VITAMIN B-12)  Inject 1,000 mcg into the muscle every 30 (thirty) days.     docusate sodium 100 MG capsule  Commonly known as:  COLACE  Take 200 mg by mouth 2 (two) times daily.     ertapenem 0.5 g in sodium chloride 0.9 % 50 mL  Inject 0.5 g into the vein daily. X 12 more days, stop date 02/10/14 (July 1st)  Start taking on:  01/30/2014     furosemide 40 MG tablet  Commonly known as:  LASIX  Take 0.5 tablets (20 mg total) by mouth daily.     HYDROcodone-acetaminophen 10-325 MG per tablet  Commonly known as:  NORCO  Take 1 tablet by mouth every 6 (six) hours.     isosorbide mononitrate 60 MG 24 hr tablet  Commonly known as:  IMDUR  Take 1 tablet (60 mg total) by mouth daily.     lactose free nutrition Liqd  Take 237 mLs by mouth 3 (three) times daily between meals.     lactulose 10 GM/15ML solution  Commonly known as:  CHRONULAC  Take 30 g by mouth daily as needed for moderate constipation.     levothyroxine 88 MCG tablet  Commonly known as:  SYNTHROID, LEVOTHROID  Take 88 mcg by mouth daily before breakfast.  nitroGLYCERIN 0.4 MG SL tablet  Commonly known as:  NITROSTAT  Place 0.4 mg under the tongue every 5 (five) minutes as needed for chest pain.     omeprazole 40 MG capsule  Commonly known as:  PRILOSEC  Take 1 capsule (40 mg total) by mouth daily.     potassium chloride 10 MEQ tablet  Commonly known as:  K-DUR,KLOR-CON  Take 10 mEq by mouth daily.     sodium chloride 0.9 % injection  10-40 mLs by Intracatheter route as needed (flush).     tamsulosin 0.4 MG Caps capsule  Commonly known as:  FLOMAX  Take 1 capsule (0.4 mg total) by mouth daily.         Brief H and P: For complete details please refer to admission H and P, but in brief [name] is a 80 y.o. female who presents to the ED with abdominal pain and generalized weakness. Abdominal pain is located in her epigastric area, aching, no radiation, severe, and onset 8 hours ago. It has been  constant since onset. She has not tried anything before coming to the ED for symptoms. She has a history of cholecystectomy in the past. She has also had nausea and vomiting associated with this.   Hospital Course:  Patient with past medical history of atrial fibrillation, systolic/diastolic heart failure and diet controlled diabetes presents to the emergency room on 6/14 with one-day history of abdominal pain-midepigastric along with associated nausea and vomiting. In the emergency room, she was noted to have an elevated temperature of 102.5, elevated heart rate and transaminitis consistent with shock liver. CT scan of the abdomen was actually unremarkable. Patient was given 1 dose of IV vancomycin and Zosyn plus IV fluids.  The patient continued to improve and transferred to floor. Remains afebrile, improvement transaminases and pressure. Blood cultures came back positive for gram-negative rods one bottle. Patient also found to be in acute on chronic heart failure and started on IV diuretics.   ESBL Escherichia coli Sepsis: Likely from the UTI, initially thought to be from GI source. Patient reported frequent UTIs and history of ESBL Escherichia coli colonization and on antibiotics in the last 1 month.  Blood cultures 1/2 ESBL Escherichia coli, sensitive to imipenem. Patient was initially placed on IV Zosyn which was discontinued after the sensitivities.  Urine culture had shown 60,000 colonies, however patient reports frequent UTIs, prior history of ESBL UTI  ID consult was obtained and patient was seen by Dr. Jerolyn Center who recommended 14 days of imipenem/ertepenem given bacteremia likely due to complicated UTI. IV midline placed for access.  Continue ertepenem till July 1st, 2015, home health nurse, PT, OT was arranged. Repeat blood cultures on 6/17 negative so far.   Abdominal pain with transaminitis and hyperbilirubinemia: Likely may have passed CBD stone, cholangitis picture at the time of  admission or shock liver  Patient has no abdominal pain, LFTs are improving, bilirubin normal,  Ultrasound, CT abdomen pelvis done, no acute abdominal pathology   DM  - Blood sugars controlled   Atrial fibrillation  - Currently stable, amiodarone currently on hold due to transaminitis  - Continue aspirin, Coreg   Acute on chronic combined systolic and diastolic heart failure: Improving, Continue Lasix, decreased to 20 mg daily   Hypothyroidism  - Continue Synthroid   Day of Discharge BP 152/67  Pulse 89  Temp(Src) 98.3 F (36.8 C) (Oral)  Resp 16  Ht 5' 4.17" (1.63 m)  Wt 48.308 kg (106  lb 8 oz)  BMI 18.18 kg/m2  SpO2 94%  Physical Exam: General: Alert and awake oriented x3 not in any acute distress. HEENT: anicteric sclera, pupils reactive to light and accommodation CVS: S1-S2 clear no murmur rubs or gallops Chest: clear to auscultation bilaterally, no wheezing rales or rhonchi Abdomen: soft nontender, nondistended, normal bowel sounds Extremities: no cyanosis, clubbing or edema noted bilaterally Neuro: Cranial nerves II-XII intact, no focal neurological deficits   The results of significant diagnostics from this hospitalization (including imaging, microbiology, ancillary and laboratory) are listed below for reference.    LAB RESULTS: Basic Metabolic Panel:  Recent Labs Lab 01/28/14 0500 01/29/14 0400  NA 144 137  K 4.2 3.8  CL 101 96  CO2 26 26  GLUCOSE 91 105*  BUN 23 26*  CREATININE 1.34* 1.41*  CALCIUM 9.4 9.6   Liver Function Tests:  Recent Labs Lab 01/28/14 0500 01/29/14 0400  AST 48* 32  ALT 128* 89*  ALKPHOS 144* 133*  BILITOT 0.6 0.5  PROT 6.9 6.8  ALBUMIN 3.3* 3.2*    Recent Labs Lab 01/24/14 2054  LIPASE 24   No results found for this basename: AMMONIA,  in the last 168 hours CBC:  Recent Labs Lab 01/24/14 2054  01/28/14 0500 01/29/14 0400  WBC 8.3  < > 5.1 6.6  NEUTROABS 7.4  --   --   --   HGB 12.6  < > 11.1* 11.3*   HCT 38.4  < > 34.7* 35.1*  MCV 91.4  < > 94.0 92.4  PLT 171  < > 174 161  < > = values in this interval not displayed. Cardiac Enzymes: No results found for this basename: CKTOTAL, CKMB, CKMBINDEX, TROPONINI,  in the last 168 hours BNP: No components found with this basename: POCBNP,  CBG:  Recent Labs Lab 01/28/14 2027 01/29/14 0740  GLUCAP 82 102*    Significant Diagnostic Studies:  Ct Abdomen Pelvis Wo Contrast  01/24/2014   CLINICAL DATA:  Lethargy and weakness.  Epigastric abdominal pain.  EXAM: CT ABDOMEN AND PELVIS WITHOUT CONTRAST  TECHNIQUE: Multidetector CT imaging of the abdomen and pelvis was performed following the standard protocol without IV contrast.  COMPARISON:  Pelvic ultrasound performed 12/02/2012, and CT of the abdomen and pelvis performed 07/24/2010  FINDINGS: The visualized lung bases are clear. The patient is status post median sternotomy. Pacemaker leads are partially imaged. Scattered coronary artery calcifications are seen.  Prominence of the intrahepatic biliary ducts likely remains within normal limits status post cholecystectomy. Clips are noted along the gallbladder fossa. The liver and spleen are otherwise unremarkable in appearance. The pancreas and left adrenal gland are unremarkable. A 1.6 cm rounded density at the right adrenal gland is nonspecific in appearance, but has increased only mildly in size from 2011, and is likely benign.  The kidneys are unremarkable in appearance. There is no evidence of hydronephrosis. Slight prominence of both ureters remains within normal limits. There is no evidence of distal obstruction. A calcification adjacent to the left vesicoureteral junction is stable from 2011 and likely vascular in nature. No renal or ureteral stones are seen. Mild nonspecific perinephric stranding is noted bilaterally. Bilateral extrarenal pelves are seen.  No free fluid is identified. The small bowel is unremarkable in appearance. The stomach is  within normal limits. No acute vascular abnormalities are seen.  There is ectasia of the infrarenal abdominal aorta, measuring 2.9 cm in maximal transverse dimension and 2.3 cm in AP dimension. There is no evidence  of aneurysmal dilatation. Diffuse calcification is seen along the abdominal aorta and its branches. Stents are noted within both proximal renal arteries, and there is relatively diffuse calcification along the superior mesenteric artery.  The appendix is not definitely seen; there is no evidence for appendicitis. Minimal diverticulosis is noted along the mid sigmoid colon, without evidence of diverticulitis. Dense stool is seen within the rectum.  The bladder is significantly distended and grossly unremarkable in appearance. The uterus is within normal limits, aside from scattered calcification. The ovaries are relatively symmetric. No suspicious adnexal masses are seen. No inguinal lymphadenopathy is seen. Postoperative change is noted at the right inguinal region.  No acute osseous abnormalities are identified. Vacuum phenomenon and disc space narrowing are seen at L4-L5.  IMPRESSION: 1. No definite acute abnormalities seen in the abdomen or pelvis. 2. Diffuse calcification along the abdominal aorta and its branches, with relatively diffuse calcification involving the superior mesenteric artery. Stents within both proximal renal arteries are grossly unremarkable in appearance. Ectasia of the infrarenal abdominal aorta, without evidence of aneurysmal dilatation. 3. Minimal diverticulosis along the mid sigmoid colon, without evidence of diverticulitis. 4. Scattered coronary artery calcifications seen. 5. 1.6 cm rounded density at the right adrenal gland is nonspecific, but is increased only mildly in size from 2011, and is likely benign.   Electronically Signed   By: Roanna Raider M.D.   On: 01/24/2014 22:10   US Abdomen Complete  01/25/2014   CLINICAL DATA:  Upper abdominal pain, elevated liver  function tests. Status post cholecystectomy.  EXAM: ULTRASOUND ABDOMEN COMPLETE  COMPARISON:  CT of the abdomen and pelvis January 24, 2014 at 2152 hr  FINDINGS: Gallbladder:  Surgically absent.  Common bile duct:  Diameter: 8 mm, no sonographic findings of choledocholithiasis.  Liver:  Mildly heterogeneous echotexture may reflect fatty infiltration without intrahepatic biliary dilatation. No perihepatic free fluid. Hepatopetal portal vein.  IVC:  No abnormality visualized.  Pancreas:  Visualized portion unremarkable.  Spleen:  Size and appearance within normal limits.  Right Kidney:  Length: 8.5 cm. Echogenicity within normal limits. No mass or hydronephrosis visualized. Mild thinning of the renal cortex. Extra renal pelvis.  Left Kidney:  Length: 10.3 cm. Echogenicity within normal limits. 3.7 x 3.2 x 2.9 cm air anechoic cyst in upper pole left kidney as seen on prior CT. No hydronephrosis visualized. Mild thinning of the renal cortex.  Abdominal aorta: Limited assessment due to bowel gas and presumed calcific atherosclerosis ; ectatic aorta is seen on CT of abdomen and pelvis from same day, reported separately.  No aneurysm visualized.  Other findings:  None.  IMPRESSION: No acute abdominal process by sonography. Status post cholecystectomy.  Mild symmetric cortical renal atrophy.   Electronically Signed   By: Awilda Metro   On: 01/25/2014 00:28   Dg Chest Portable 1 View  01/24/2014   CLINICAL DATA:  Weakness.  History of diabetes.  EXAM: PORTABLE CHEST - 1 VIEW  COMPARISON:  One-view chest 06/27/2013.  FINDINGS: The heart is enlarged. There is no edema or effusion to suggest failure. No focal airspace disease is present. Pacing wires are stable. Advanced degenerative changes in the shoulders is again noted.  IMPRESSION: 1. Cardiomegaly without failure. 2. No acute cardiopulmonary disease.   Electronically Signed   By: Gennette Pac M.D.   On: 01/24/2014 21:28      Disposition and  Follow-up: Discharge Instructions   Diet - low sodium heart healthy    Complete by:  As directed  Increase activity slowly    Complete by:  As directed             DISPOSITION: Home  DIET: Soft diet    DISCHARGE FOLLOW-UP Follow-up Information   Follow up with Josue Hector, MD. Schedule an appointment as soon as possible for a visit in 1 week. (for hospital follow-up)    Specialty:  Family Medicine   Contact information:   [address] [place] [address] [phone]       Time spent on Discharge: 40 mins   Signed:   RAI,RIPUDEEP M.D. [facility] 01/29/2014, 8:45 AM Pager: [phone]   **Disclaimer: This note was dictated with voice recognition software. Similar sounding words can inadvertently be transcribed and this note may contain transcription errors which may not have been corrected upon publication of note.**

## 2014-01-29 NOTE — Progress Notes (Signed)
CARE MANAGEMENT NOTE 01/29/2014  Patient:  LASHYRA, CROUCH   Account Number:  [id number]  Date Initiated:  01/25/2014  Documentation initiated by:  Junius Creamer  Subjective/Objective Assessment:   adm w sepsis     Action/Plan:   lives alone, pcp dr Darcel Bayley [place]   Anticipated DC Date:     Anticipated DC Plan:           [facility] Choice  HOME HEALTH   Choice offered to / List presented to:  C-1 Patient        HH arranged  HH-1 RN  HH-2 PT  HH-3 OT  HH-4 NURSE'S AIDE      HH agency  [facility]   Status of service:  Completed, signed off Medicare Important Message given?   (If response is "NO", the following Medicare IM given date fields will be blank) Date Medicare IM given:   Date Additional Medicare IM given:  01/29/2014  Discharge Disposition:  HOME W HOME HEALTH SERVICES  Per UR Regulation:  Reviewed for med. necessity/level of care/duration of stay  If discussed at Long Length of Stay Meetings, dates discussed:    Comments:  01/29/2014  1000  Darlyne Russian RN, [place] [phone] CM referral:HHRN.PT,OT aide, IV antibiotics  Patient selected [facility] services [facility] called with referral plan for discharge today

## 2014-01-31 LAB — CULTURE, BLOOD (ROUTINE X 2): Culture: NO GROWTH

## 2014-02-02 LAB — CULTURE, BLOOD (ROUTINE X 2)
CULTURE: NO GROWTH
Culture: NO GROWTH

## 2014-02-04 ENCOUNTER — Emergency Department (HOSPITAL_COMMUNITY): Payer: Medicare Other

## 2014-02-04 ENCOUNTER — Encounter (HOSPITAL_COMMUNITY): Payer: Self-pay | Admitting: Emergency Medicine

## 2014-02-04 ENCOUNTER — Inpatient Hospital Stay (HOSPITAL_COMMUNITY)
Admission: EM | Admit: 2014-02-04 | Discharge: 2014-02-08 | DRG: 871 | Disposition: A | Discharge status: Skilled Nursing Facility | Payer: Medicare Other | Attending: Internal Medicine | Admitting: Internal Medicine

## 2014-02-04 DIAGNOSIS — Z79899 Other long term (current) drug therapy: Secondary | ICD-10-CM

## 2014-02-04 DIAGNOSIS — R61 Generalized hyperhidrosis: Secondary | ICD-10-CM | POA: Diagnosis present

## 2014-02-04 DIAGNOSIS — I495 Sick sinus syndrome: Secondary | ICD-10-CM | POA: Diagnosis present

## 2014-02-04 DIAGNOSIS — R4789 Other speech disturbances: Secondary | ICD-10-CM | POA: Diagnosis present

## 2014-02-04 DIAGNOSIS — M1990 Unspecified osteoarthritis, unspecified site: Secondary | ICD-10-CM | POA: Diagnosis present

## 2014-02-04 DIAGNOSIS — A419 Sepsis, unspecified organism: Principal | ICD-10-CM | POA: Diagnosis present

## 2014-02-04 DIAGNOSIS — I2589 Other forms of chronic ischemic heart disease: Secondary | ICD-10-CM | POA: Diagnosis present

## 2014-02-04 DIAGNOSIS — Z888 Allergy status to other drugs, medicaments and biological substances status: Secondary | ICD-10-CM

## 2014-02-04 DIAGNOSIS — Z7982 Long term (current) use of aspirin: Secondary | ICD-10-CM

## 2014-02-04 DIAGNOSIS — R55 Syncope and collapse: Secondary | ICD-10-CM

## 2014-02-04 DIAGNOSIS — R740 Nonspecific elevation of levels of transaminase and lactic acid dehydrogenase [LDH]: Secondary | ICD-10-CM

## 2014-02-04 DIAGNOSIS — I4891 Unspecified atrial fibrillation: Secondary | ICD-10-CM | POA: Diagnosis present

## 2014-02-04 DIAGNOSIS — Z885 Allergy status to narcotic agent status: Secondary | ICD-10-CM

## 2014-02-04 DIAGNOSIS — I4892 Unspecified atrial flutter: Secondary | ICD-10-CM | POA: Diagnosis present

## 2014-02-04 DIAGNOSIS — R0902 Hypoxemia: Secondary | ICD-10-CM

## 2014-02-04 DIAGNOSIS — R339 Retention of urine, unspecified: Secondary | ICD-10-CM

## 2014-02-04 DIAGNOSIS — R509 Fever, unspecified: Secondary | ICD-10-CM

## 2014-02-04 DIAGNOSIS — J4489 Other specified chronic obstructive pulmonary disease: Secondary | ICD-10-CM | POA: Diagnosis present

## 2014-02-04 DIAGNOSIS — R531 Weakness: Secondary | ICD-10-CM | POA: Diagnosis present

## 2014-02-04 DIAGNOSIS — I509 Heart failure, unspecified: Secondary | ICD-10-CM | POA: Diagnosis present

## 2014-02-04 DIAGNOSIS — I5043 Acute on chronic combined systolic (congestive) and diastolic (congestive) heart failure: Secondary | ICD-10-CM

## 2014-02-04 DIAGNOSIS — I482 Chronic atrial fibrillation, unspecified: Secondary | ICD-10-CM

## 2014-02-04 DIAGNOSIS — I080 Rheumatic disorders of both mitral and aortic valves: Secondary | ICD-10-CM

## 2014-02-04 DIAGNOSIS — D519 Vitamin B12 deficiency anemia, unspecified: Secondary | ICD-10-CM

## 2014-02-04 DIAGNOSIS — Z9089 Acquired absence of other organs: Secondary | ICD-10-CM

## 2014-02-04 DIAGNOSIS — R269 Unspecified abnormalities of gait and mobility: Secondary | ICD-10-CM

## 2014-02-04 DIAGNOSIS — R079 Chest pain, unspecified: Secondary | ICD-10-CM

## 2014-02-04 DIAGNOSIS — J449 Chronic obstructive pulmonary disease, unspecified: Secondary | ICD-10-CM | POA: Diagnosis present

## 2014-02-04 DIAGNOSIS — I429 Cardiomyopathy, unspecified: Secondary | ICD-10-CM

## 2014-02-04 DIAGNOSIS — N183 Chronic kidney disease, stage 3 unspecified: Secondary | ICD-10-CM | POA: Diagnosis present

## 2014-02-04 DIAGNOSIS — I10 Essential (primary) hypertension: Secondary | ICD-10-CM | POA: Diagnosis present

## 2014-02-04 DIAGNOSIS — Z950 Presence of cardiac pacemaker: Secondary | ICD-10-CM | POA: Diagnosis present

## 2014-02-04 DIAGNOSIS — M109 Gout, unspecified: Secondary | ICD-10-CM | POA: Diagnosis present

## 2014-02-04 DIAGNOSIS — I208 Other forms of angina pectoris: Secondary | ICD-10-CM

## 2014-02-04 DIAGNOSIS — D649 Anemia, unspecified: Secondary | ICD-10-CM | POA: Diagnosis present

## 2014-02-04 DIAGNOSIS — R001 Bradycardia, unspecified: Secondary | ICD-10-CM

## 2014-02-04 DIAGNOSIS — E039 Hypothyroidism, unspecified: Secondary | ICD-10-CM | POA: Diagnosis present

## 2014-02-04 DIAGNOSIS — Z8249 Family history of ischemic heart disease and other diseases of the circulatory system: Secondary | ICD-10-CM

## 2014-02-04 DIAGNOSIS — I5022 Chronic systolic (congestive) heart failure: Secondary | ICD-10-CM | POA: Diagnosis present

## 2014-02-04 DIAGNOSIS — F3289 Other specified depressive episodes: Secondary | ICD-10-CM | POA: Diagnosis present

## 2014-02-04 DIAGNOSIS — I5040 Unspecified combined systolic (congestive) and diastolic (congestive) heart failure: Secondary | ICD-10-CM

## 2014-02-04 DIAGNOSIS — E785 Hyperlipidemia, unspecified: Secondary | ICD-10-CM | POA: Diagnosis present

## 2014-02-04 DIAGNOSIS — E43 Unspecified severe protein-calorie malnutrition: Secondary | ICD-10-CM | POA: Diagnosis present

## 2014-02-04 DIAGNOSIS — R21 Rash and other nonspecific skin eruption: Secondary | ICD-10-CM

## 2014-02-04 DIAGNOSIS — R627 Adult failure to thrive: Secondary | ICD-10-CM | POA: Diagnosis present

## 2014-02-04 DIAGNOSIS — N939 Abnormal uterine and vaginal bleeding, unspecified: Secondary | ICD-10-CM

## 2014-02-04 DIAGNOSIS — I5042 Chronic combined systolic (congestive) and diastolic (congestive) heart failure: Secondary | ICD-10-CM | POA: Diagnosis present

## 2014-02-04 DIAGNOSIS — R52 Pain, unspecified: Secondary | ICD-10-CM

## 2014-02-04 DIAGNOSIS — R0609 Other forms of dyspnea: Secondary | ICD-10-CM

## 2014-02-04 DIAGNOSIS — I2510 Atherosclerotic heart disease of native coronary artery without angina pectoris: Secondary | ICD-10-CM | POA: Diagnosis present

## 2014-02-04 DIAGNOSIS — F0390 Unspecified dementia without behavioral disturbance: Secondary | ICD-10-CM | POA: Diagnosis present

## 2014-02-04 DIAGNOSIS — R0789 Other chest pain: Secondary | ICD-10-CM | POA: Diagnosis present

## 2014-02-04 DIAGNOSIS — Z91041 Radiographic dye allergy status: Secondary | ICD-10-CM

## 2014-02-04 DIAGNOSIS — I129 Hypertensive chronic kidney disease with stage 1 through stage 4 chronic kidney disease, or unspecified chronic kidney disease: Secondary | ICD-10-CM | POA: Diagnosis present

## 2014-02-04 DIAGNOSIS — G2581 Restless legs syndrome: Secondary | ICD-10-CM | POA: Diagnosis present

## 2014-02-04 DIAGNOSIS — R7401 Elevation of levels of liver transaminase levels: Secondary | ICD-10-CM

## 2014-02-04 DIAGNOSIS — F419 Anxiety disorder, unspecified: Secondary | ICD-10-CM

## 2014-02-04 DIAGNOSIS — Z66 Do not resuscitate: Secondary | ICD-10-CM | POA: Diagnosis present

## 2014-02-04 DIAGNOSIS — F329 Major depressive disorder, single episode, unspecified: Secondary | ICD-10-CM | POA: Diagnosis present

## 2014-02-04 DIAGNOSIS — K219 Gastro-esophageal reflux disease without esophagitis: Secondary | ICD-10-CM | POA: Diagnosis present

## 2014-02-04 DIAGNOSIS — I701 Atherosclerosis of renal artery: Secondary | ICD-10-CM | POA: Diagnosis present

## 2014-02-04 DIAGNOSIS — R4181 Age-related cognitive decline: Secondary | ICD-10-CM | POA: Diagnosis present

## 2014-02-04 DIAGNOSIS — N289 Disorder of kidney and ureter, unspecified: Secondary | ICD-10-CM

## 2014-02-04 DIAGNOSIS — Z9181 History of falling: Secondary | ICD-10-CM

## 2014-02-04 DIAGNOSIS — R4701 Aphasia: Secondary | ICD-10-CM | POA: Diagnosis present

## 2014-02-04 DIAGNOSIS — N390 Urinary tract infection, site not specified: Secondary | ICD-10-CM

## 2014-02-04 DIAGNOSIS — E119 Type 2 diabetes mellitus without complications: Secondary | ICD-10-CM | POA: Diagnosis present

## 2014-02-04 DIAGNOSIS — N184 Chronic kidney disease, stage 4 (severe): Secondary | ICD-10-CM

## 2014-02-04 DIAGNOSIS — I480 Paroxysmal atrial fibrillation: Secondary | ICD-10-CM | POA: Diagnosis present

## 2014-02-04 DIAGNOSIS — I5023 Acute on chronic systolic (congestive) heart failure: Secondary | ICD-10-CM

## 2014-02-04 DIAGNOSIS — Z9861 Coronary angioplasty status: Secondary | ICD-10-CM

## 2014-02-04 DIAGNOSIS — Z951 Presence of aortocoronary bypass graft: Secondary | ICD-10-CM

## 2014-02-04 LAB — COMPREHENSIVE METABOLIC PANEL
ALK PHOS: 113 U/L (ref 39–117)
ALT: 20 U/L (ref 0–35)
AST: 19 U/L (ref 0–37)
Albumin: 3.6 g/dL (ref 3.5–5.2)
BUN: 35 mg/dL — AB (ref 6–23)
CO2: 25 meq/L (ref 19–32)
Calcium: 9.8 mg/dL (ref 8.4–10.5)
Chloride: 98 mEq/L (ref 96–112)
Creatinine, Ser: 1.49 mg/dL — ABNORMAL HIGH (ref 0.50–1.10)
GFR, EST AFRICAN AMERICAN: 35 mL/min — AB (ref >90)
GFR, EST NON AFRICAN AMERICAN: 31 mL/min — AB (ref >90)
GLUCOSE: 107 mg/dL — AB (ref 70–99)
Potassium: 4.7 mEq/L (ref 3.7–5.3)
SODIUM: 138 meq/L (ref 137–147)
TOTAL PROTEIN: 7.3 g/dL (ref 6.0–8.3)
Total Bilirubin: 0.4 mg/dL (ref 0.3–1.2)

## 2014-02-04 LAB — CBC WITH DIFFERENTIAL/PLATELET
BASOS PCT: 1 % (ref 0–1)
Basophils Absolute: 0 10*3/uL (ref 0.0–0.1)
Eosinophils Absolute: 0.4 10*3/uL (ref 0.0–0.7)
Eosinophils Relative: 4 % (ref 0–5)
HEMATOCRIT: 35.3 % — AB (ref 36.0–46.0)
HEMOGLOBIN: 11.3 g/dL — AB (ref 12.0–15.0)
LYMPHS ABS: 1.4 10*3/uL (ref 0.7–4.0)
Lymphocytes Relative: 17 % (ref 12–46)
MCH: 29.6 pg (ref 26.0–34.0)
MCHC: 32 g/dL (ref 30.0–36.0)
MCV: 92.4 fL (ref 78.0–100.0)
MONO ABS: 0.7 10*3/uL (ref 0.1–1.0)
MONOS PCT: 9 % (ref 3–12)
NEUTROS PCT: 69 % (ref 43–77)
Neutro Abs: 5.5 10*3/uL (ref 1.7–7.7)
Platelets: 223 10*3/uL (ref 150–400)
RBC: 3.82 MIL/uL — AB (ref 3.87–5.11)
RDW: 15.3 % (ref 11.5–15.5)
WBC: 7.9 10*3/uL (ref 4.0–10.5)

## 2014-02-04 LAB — I-STAT CG4 LACTIC ACID, ED: LACTIC ACID, VENOUS: 1.14 mmol/L (ref 0.5–2.2)

## 2014-02-04 LAB — TROPONIN I: Troponin I: <0.3 ng/mL (ref <0.30)

## 2014-02-04 LAB — LIPASE, BLOOD: Lipase: 32 U/L (ref 11–59)

## 2014-02-04 LAB — AMMONIA: Ammonia: 16 umol/L (ref 11–60)

## 2014-02-04 LAB — CBG MONITORING, ED: Glucose-Capillary: 103 mg/dL — ABNORMAL HIGH (ref 70–99)

## 2014-02-04 MED ORDER — OXYCODONE-ACETAMINOPHEN 5-325 MG PO TABS
2.0000 | ORAL_TABLET | Freq: Once | ORAL | Status: AC
  Administered 2014-02-04: 2 via ORAL
  Filled 2014-02-04: qty 2

## 2014-02-04 MED ORDER — FUROSEMIDE 40 MG PO TABS
40.0000 mg | ORAL_TABLET | Freq: Every day | ORAL | Status: DC
  Administered 2014-02-04: 40 mg via ORAL
  Filled 2014-02-04 (×2): qty 1

## 2014-02-04 MED ORDER — BOOST PO LIQD
237.0000 mL | Freq: Three times a day (TID) | ORAL | Status: DC
  Administered 2014-02-05 – 2014-02-08 (×9): 237 mL via ORAL
  Filled 2014-02-04 (×15): qty 237

## 2014-02-04 MED ORDER — AMIODARONE HCL 100 MG PO TABS
100.0000 mg | ORAL_TABLET | Freq: Every day | ORAL | Status: DC
  Administered 2014-02-04 – 2014-02-08 (×5): 100 mg via ORAL
  Filled 2014-02-04 (×5): qty 1

## 2014-02-04 MED ORDER — HYDROCODONE-ACETAMINOPHEN 10-325 MG PO TABS
1.0000 | ORAL_TABLET | Freq: Four times a day (QID) | ORAL | Status: DC
  Administered 2014-02-04 – 2014-02-08 (×12): 1 via ORAL
  Filled 2014-02-04 (×13): qty 1

## 2014-02-04 MED ORDER — TAMSULOSIN HCL 0.4 MG PO CAPS
0.4000 mg | ORAL_CAPSULE | Freq: Every day | ORAL | Status: DC
  Administered 2014-02-04 – 2014-02-08 (×5): 0.4 mg via ORAL
  Filled 2014-02-04 (×5): qty 1

## 2014-02-04 MED ORDER — ERTAPENEM SODIUM 1 G IJ SOLR: 1.0000 g | INTRAMUSCULAR | Status: DC

## 2014-02-04 MED ORDER — SODIUM CHLORIDE 0.9 % IJ SOLN
10.0000 mL | INTRAMUSCULAR | Status: DC | PRN
  Administered 2014-02-04: 10 mL

## 2014-02-04 MED ORDER — ALPRAZOLAM 0.25 MG PO TABS
0.2500 mg | ORAL_TABLET | Freq: Two times a day (BID) | ORAL | Status: DC
  Administered 2014-02-04 – 2014-02-05 (×2): 0.25 mg via ORAL
  Filled 2014-02-04 (×2): qty 1

## 2014-02-04 MED ORDER — NITROGLYCERIN 0.4 MG SL SUBL: 0.4000 mg | SUBLINGUAL_TABLET | SUBLINGUAL | Status: DC | PRN

## 2014-02-04 MED ORDER — POTASSIUM CHLORIDE CRYS ER 10 MEQ PO TBCR
10.0000 meq | EXTENDED_RELEASE_TABLET | Freq: Every day | ORAL | Status: DC
  Administered 2014-02-04 – 2014-02-08 (×5): 10 meq via ORAL
  Filled 2014-02-04 (×5): qty 1

## 2014-02-04 MED ORDER — PANTOPRAZOLE SODIUM 40 MG PO TBEC
40.0000 mg | DELAYED_RELEASE_TABLET | Freq: Every day | ORAL | Status: DC
  Administered 2014-02-04 – 2014-02-08 (×5): 40 mg via ORAL
  Filled 2014-02-04 (×5): qty 1

## 2014-02-04 MED ORDER — HEPARIN SODIUM (PORCINE) 5000 UNIT/ML IJ SOLN: 5000.0000 [IU] | Freq: Three times a day (TID) | INTRAMUSCULAR | Status: DC

## 2014-02-04 MED ORDER — CARVEDILOL 12.5 MG PO TABS
12.5000 mg | ORAL_TABLET | Freq: Two times a day (BID) | ORAL | Status: DC
  Administered 2014-02-04 – 2014-02-06 (×4): 12.5 mg via ORAL
  Filled 2014-02-04 (×6): qty 1

## 2014-02-04 MED ORDER — SODIUM CHLORIDE 0.9 % IJ SOLN: 10.0000 mL | INTRAMUSCULAR | Status: DC | PRN

## 2014-02-04 MED ORDER — ACETAMINOPHEN 325 MG PO TABS
650.0000 mg | ORAL_TABLET | ORAL | Status: DC | PRN
  Administered 2014-02-05 – 2014-02-06 (×2): 650 mg via ORAL
  Filled 2014-02-04 (×2): qty 2

## 2014-02-04 MED ORDER — CYANOCOBALAMIN 1000 MCG/ML IJ SOLN
1000.0000 ug | INTRAMUSCULAR | Status: DC
  Administered 2014-02-04: 1000 ug via INTRAMUSCULAR
  Filled 2014-02-04: qty 1

## 2014-02-04 MED ORDER — LEVOTHYROXINE SODIUM 88 MCG PO TABS
88.0000 ug | ORAL_TABLET | Freq: Every day | ORAL | Status: DC
  Administered 2014-02-05 – 2014-02-08 (×4): 88 ug via ORAL
  Filled 2014-02-04 (×5): qty 1

## 2014-02-04 MED ORDER — ASPIRIN EC 81 MG PO TBEC
81.0000 mg | DELAYED_RELEASE_TABLET | Freq: Every day | ORAL | Status: DC
Start: 2014-02-04 — End: 2014-02-08
  Administered 2014-02-04 – 2014-02-08 (×5): 81 mg via ORAL
  Filled 2014-02-04 (×5): qty 1

## 2014-02-04 MED ORDER — DOCUSATE SODIUM 100 MG PO CAPS
200.0000 mg | ORAL_CAPSULE | Freq: Two times a day (BID) | ORAL | Status: DC
  Administered 2014-02-04 – 2014-02-08 (×8): 200 mg via ORAL
  Filled 2014-02-04 (×9): qty 2

## 2014-02-04 MED ORDER — SODIUM CHLORIDE 0.9 % IV SOLN
1.0000 g | INTRAVENOUS | Status: DC
  Administered 2014-02-04 – 2014-02-05 (×2): 1 g via INTRAVENOUS
  Filled 2014-02-04 (×3): qty 1

## 2014-02-04 MED ORDER — SODIUM CHLORIDE 0.9 % IV SOLN
500.0000 mg | Freq: Once | INTRAVENOUS | Status: AC
  Administered 2014-02-04: 0.5 g via INTRAVENOUS
  Filled 2014-02-04: qty 0.5

## 2014-02-04 MED ORDER — ATORVASTATIN CALCIUM 10 MG PO TABS
10.0000 mg | ORAL_TABLET | Freq: Every day | ORAL | Status: DC
  Administered 2014-02-04 – 2014-02-07 (×3): 10 mg via ORAL
  Filled 2014-02-04 (×6): qty 1

## 2014-02-04 MED ORDER — ISOSORBIDE MONONITRATE ER 60 MG PO TB24
60.0000 mg | ORAL_TABLET | Freq: Every day | ORAL | Status: DC
  Administered 2014-02-04 – 2014-02-06 (×3): 60 mg via ORAL
  Filled 2014-02-04 (×3): qty 1

## 2014-02-04 NOTE — ED Notes (Signed)
  CBG 103  

## 2014-02-04 NOTE — Progress Notes (Signed)
Patient's RUE Midline catheter leaking with infusion and flush with NS.  Patient reports that this started earlier today while at home.  Site pink.  Bedside RN Crystal updated.  MD notified and orders received to start NSL and pull midline.  PICC to be placed tomorrow in right arm.  Left arm is restricted.  Cothren, Lajean Manes, RN VAST.

## 2014-02-04 NOTE — Consult Note (Signed)
CARDIOLOGY CONSULT NOTE   Patient ID: [name] [name] MRN: [id number], DOB/AGE: 10-20-1924   Admit date: 02/04/2014 Date of Consult: 02/04/2014   Primary Physician: Josue Hector, MD Primary Cardiologist: Dr. Antoine Poche  Pt. Profile  80 year old Caucasian female with past medical history significant for coronary artery disease status post LAD stent and CABG in 2004, combined systolic and diastolic heart failure, atrial fibrillation status post DC cardioversion 2003, chronic kidney disease, hypertension, and hyperlipidemia, history of cardiomyopathy with baseline EF 30-35% presented with generalized weakness and recurrent a-fib  Problem List  Past Medical History  Diagnosis Date  . Diabetes mellitus without complication   . Chest pain   . DJD (degenerative joint disease)   . Tachycardia-bradycardia syndrome   . CKD (chronic kidney disease)   . Cardiomegaly   . Coronary artery disease     a. LAD stent 1999 after NSTEMI. b. CABG 2004. c. Last LHC 02/2010 - patent grafts.  . Hypertension   . Hyperlipidemia   . Carotid artery disease     status post right carotid endarterectomy.  . Degenerative joint disease   . Restless leg syndrome   . Chronic renal insufficiency   . Heparin induced thrombocytopenia   . Diphtheria     "as a child"  . Pleural effusion, bilateral 06/2003  . Pseudoaneurysm     status post repair following catherization in 1999  . Chronic systolic heart failure 10/02/11  . Ischemic cardiomyopathy     Echocardiogram 10/01/11: Mild LVH, EF 35-40%, basal inferior, basal to mid posterior and basal to mid anterolateral severe hypokinesis, mild to moderate AI, moderate MR (ischemic MR), moderate LAE, mild RVE, mildly reduced RV systolic function, mild RAE, PASP 43-44  . COPD (chronic obstructive pulmonary disease)   . Atrial fibrillation/flutter     s/p multiple DCCVs;  amiodarone started 09/2011, TEE DCCV 3/13;  amio and coumadin d/c'd after admxn to [facility] 02/2012  with frequent falls/syncope  . Tachycardia-bradycardia syndrome     s/p Medtronic pacemaker implant 09/2011 (8 sec pause noted)  . GERD (gastroesophageal reflux disease)   . Renal artery stenosis     bilateral- status post stenting  . Non-functioning kidney   . Depression     "cries alot"  . Gout   . Hypothyroidism   . Anemia     Past Surgical History  Procedure Laterality Date  . Pacemaker insertion    . Carotid endarterectomy  05/2003    right  . Pacemaker insertion  10/02/11    implanted by Dr Johney Frame  . Cholecystectomy  ?02/2011  . Tonsillectomy and adenoidectomy    . Renal artery stent  06/2003    bilaterally/e-chart  . Thoracentesis  ? 2000; 05/2011  . Coronary angioplasty with stent placement  1999  . Av fistula repair  01/1998    w/pseudoaneurysm repair S/P catheterization/E-chart  . Coronary artery bypass graft  05/2003    CABG X 3  . Tee without cardioversion  10/23/2011    Procedure: TRANSESOPHAGEAL ECHOCARDIOGRAM (TEE);  Surgeon: Wendall Stade, MD;  Location: [facility] ENDOSCOPY;  Service: Cardiovascular;  Laterality: N/A;  . Cardioversion  10/23/2011    Procedure: CARDIOVERSION;  Surgeon: Wendall Stade, MD;  Location: [facility] ENDOSCOPY;  Service: Cardiovascular;  Laterality: N/A;     Allergies  Allergies  Allergen Reactions  . Heparin Other (See Comments)    Clots blood instead of thinning  . Hydromorphone Hcl Other (See Comments)    Crazy feeling  . Warfarin And Related Other (  See Comments)    Syncope events  . Altace [Ramipril] Hives  . Contrast Media [Iodinated Diagnostic Agents] Other (See Comments)    Reaction noted by md- might have caused aneurism in 1999  . Morphine And Related Nausea And Vomiting  . Plavix [Clopidogrel Bisulfate] Hives and Itching    HPI   The patient is an 80 year old Caucasian female with past medical history significant for coronary artery disease status post LAD stent and CABG in 2004, combined systolic and diastolic heart failure,  atrial fibrillation status post DC cardioversion 2003, chronic kidney disease, hypertension, and hyperlipidemia, history of cardiomyopathy with baseline EF 30-35%. Patient was recently discharged on 01/29/2014 after admitted for bacteremia. Patient was discharged home a PICC line to start on IV artery patent. According to the family member, since discharge, patient has been getting around at home but they did notice a gradual decline in her functional status. She denies any recent fever, chill, lower extremity edema, orthopnea or paroxysmal nocturnal dyspnea. According to the family she has been receiving her medication every day as scheduled. She states she used to have exertional chest discomfort, however she also claims she has chronic chest pain and left shoulder pain as well.   Patient noticed a drastic decline in her functional status in the morning of 02/04/2014. She had generalized weakness and was having trouble standing. Family mentioned her weakness is more noticeable on the left side compared to her right side. Therefore, she was taken to [facility] for further evaluation. On arrival to [facility], her blood pressure was stable and the 130 to 140s. Her heart rate was slightly elevated the 90s range. EKG was obtained which showed the patient is back in atrial fibrillation. CT of the head shows some chronic changes, however no acute process. Cardiology was consulted for management of atrial fibrillation.  Of note, according to the family, she was previously on Coumadin at one point, however was stopped because she has recurrent fall and dizziness while on Coumadin.  Inpatient Medications    Family History Family History  Problem Relation Age of Onset  . Cancer Mother 34    died  . Lung disease Father 74    died  . Heart disease Sister     2 sisters and her son  . Anesthesia problems Neg Hx      Social History History   Social History  . Marital Status: Widowed    Spouse  Name: N/A    Number of Children: N/A  . Years of Education: N/A   Occupational History  . retired Health and safety inspector    Social History Main Topics  . Smoking status: Never Smoker   . Smokeless tobacco: Never Used  . Alcohol Use: No  . Drug Use: No  . Sexual Activity: No   Other Topics Concern  . Not on file   Social History Narrative   ** Merged History Encounter **         Review of Systems  General:  No chills, fever, night sweats or weight changes.  Cardiovascular:  No edema, orthopnea, palpitations, paroxysmal nocturnal dyspnea. Occasional exertional chest pain, however patient is not sure. Denies any significant lower extremity edema. Increasing dyspnea on exertion, recently requiring home oxygen. Dermatological: No rash, lesions/masses Respiratory: No cough, dyspnea Urologic: No hematuria, dysuria Abdominal:   No nausea, vomiting, diarrhea, bright red blood per rectum, melena, or hematemesis Neurologic:  No visual changes, changes in mental status. Generalized weakness, worse on left side All  other systems reviewed and are otherwise negative except as noted above.  Physical Exam  Blood pressure 132/57, pulse 76, temperature 98.3 F (36.8 C), temperature source Oral, resp. rate 18, SpO2 99.00%.  General: Pleasant, NAD. Frail Psych: Normal affect. Neuro: Alert and oriented X 3. Moves all extremities spontaneously. HEENT: Normal  Neck: Supple without bruits or JVD. Lungs:  Resp regular and unlabored, CTA. Heart: tachycardic, irregular, no s3, s4, or murmurs. Abdomen: Soft, non-tender, non-distended, BS + x 4.  Extremities: No clubbing, cyanosis or edema. DP/PT/Radials 2+ and equal bilaterally.  Labs   Recent Labs  02/04/14 1238  TROPONINI <0.30   Lab Results  Component Value Date   WBC 7.9 02/04/2014   HGB 11.3* 02/04/2014   HCT 35.3* 02/04/2014   MCV 92.4 02/04/2014   PLT 223 02/04/2014    Recent Labs Lab 02/04/14 1238  NA 138  K 4.7  CL 98  CO2 25    BUN 35*  CREATININE 1.49*  CALCIUM 9.8  PROT 7.3  BILITOT 0.4  ALKPHOS 113  ALT 20  AST 19  GLUCOSE 107*   Lab Results  Component Value Date   CHOL 141 01/11/2013   HDL 54 01/11/2013   LDLCALC 69 01/11/2013   TRIG 91 01/11/2013   Lab Results  Component Value Date   DDIMER 1.77* 02/19/2012    Radiology/Studies  Ct Abdomen Pelvis Wo Contrast  01/24/2014   CLINICAL DATA:  Lethargy and weakness.  Epigastric abdominal pain.  EXAM: CT ABDOMEN AND PELVIS WITHOUT CONTRAST  TECHNIQUE: Multidetector CT imaging of the abdomen and pelvis was performed following the standard protocol without IV contrast.  COMPARISON:  Pelvic ultrasound performed 12/02/2012, and CT of the abdomen and pelvis performed 07/24/2010  FINDINGS: The visualized lung bases are clear. The patient is status post median sternotomy. Pacemaker leads are partially imaged. Scattered coronary artery calcifications are seen.  Prominence of the intrahepatic biliary ducts likely remains within normal limits status post cholecystectomy. Clips are noted along the gallbladder fossa. The liver and spleen are otherwise unremarkable in appearance. The pancreas and left adrenal gland are unremarkable. A 1.6 cm rounded density at the right adrenal gland is nonspecific in appearance, but has increased only mildly in size from 2011, and is likely benign.  The kidneys are unremarkable in appearance. There is no evidence of hydronephrosis. Slight prominence of both ureters remains within normal limits. There is no evidence of distal obstruction. A calcification adjacent to the left vesicoureteral junction is stable from 2011 and likely vascular in nature. No renal or ureteral stones are seen. Mild nonspecific perinephric stranding is noted bilaterally. Bilateral extrarenal pelves are seen.  No free fluid is identified. The small bowel is unremarkable in appearance. The stomach is within normal limits. No acute vascular abnormalities are seen.  There is ectasia  of the infrarenal abdominal aorta, measuring 2.9 cm in maximal transverse dimension and 2.3 cm in AP dimension. There is no evidence of aneurysmal dilatation. Diffuse calcification is seen along the abdominal aorta and its branches. Stents are noted within both proximal renal arteries, and there is relatively diffuse calcification along the superior mesenteric artery.  The appendix is not definitely seen; there is no evidence for appendicitis. Minimal diverticulosis is noted along the mid sigmoid colon, without evidence of diverticulitis. Dense stool is seen within the rectum.  The bladder is significantly distended and grossly unremarkable in appearance. The uterus is within normal limits, aside from scattered calcification. The ovaries are relatively symmetric. No suspicious  adnexal masses are seen. No inguinal lymphadenopathy is seen. Postoperative change is noted at the right inguinal region.  No acute osseous abnormalities are identified. Vacuum phenomenon and disc space narrowing are seen at L4-L5.  IMPRESSION: 1. No definite acute abnormalities seen in the abdomen or pelvis. 2. Diffuse calcification along the abdominal aorta and its branches, with relatively diffuse calcification involving the superior mesenteric artery. Stents within both proximal renal arteries are grossly unremarkable in appearance. Ectasia of the infrarenal abdominal aorta, without evidence of aneurysmal dilatation. 3. Minimal diverticulosis along the mid sigmoid colon, without evidence of diverticulitis. 4. Scattered coronary artery calcifications seen. 5. 1.6 cm rounded density at the right adrenal gland is nonspecific, but is increased only mildly in size from 2011, and is likely benign.   Electronically Signed   By: Roanna Raider M.D.   On: 01/24/2014 22:10   Ct Head Wo Contrast  02/04/2014   CLINICAL DATA:  Right leg weakness.  EXAM: CT HEAD WITHOUT CONTRAST  TECHNIQUE: Contiguous axial images were obtained from the base of the  skull through the vertex without intravenous contrast.  COMPARISON:  CT scan of August 04, 2012.  FINDINGS: Bony calvarium appears intact. Mild diffuse cortical atrophy is noted. Mild chronic ischemic white matter disease is noted. No mass effect or midline shift is noted. Ventricular size is within normal limits. There is no evidence of mass lesion, hemorrhage or acute infarction.  IMPRESSION: Mild diffuse cortical atrophy. Mild chronic ischemic white matter disease. No acute intracranial abnormality seen.   Electronically Signed   By: Roque Lias M.D.   On: 02/04/2014 14:05   US Abdomen Complete  01/25/2014   CLINICAL DATA:  Upper abdominal pain, elevated liver function tests. Status post cholecystectomy.  EXAM: ULTRASOUND ABDOMEN COMPLETE  COMPARISON:  CT of the abdomen and pelvis January 24, 2014 at 2152 hr  FINDINGS: Gallbladder:  Surgically absent.  Common bile duct:  Diameter: 8 mm, no sonographic findings of choledocholithiasis.  Liver:  Mildly heterogeneous echotexture may reflect fatty infiltration without intrahepatic biliary dilatation. No perihepatic free fluid. Hepatopetal portal vein.  IVC:  No abnormality visualized.  Pancreas:  Visualized portion unremarkable.  Spleen:  Size and appearance within normal limits.  Right Kidney:  Length: 8.5 cm. Echogenicity within normal limits. No mass or hydronephrosis visualized. Mild thinning of the renal cortex. Extra renal pelvis.  Left Kidney:  Length: 10.3 cm. Echogenicity within normal limits. 3.7 x 3.2 x 2.9 cm air anechoic cyst in upper pole left kidney as seen on prior CT. No hydronephrosis visualized. Mild thinning of the renal cortex.  Abdominal aorta: Limited assessment due to bowel gas and presumed calcific atherosclerosis ; ectatic aorta is seen on CT of abdomen and pelvis from same day, reported separately.  No aneurysm visualized.  Other findings:  None.  IMPRESSION: No acute abdominal process by sonography. Status post cholecystectomy.  Mild  symmetric cortical renal atrophy.   Electronically Signed   By: Awilda Metro   On: 01/25/2014 00:28   Dg Chest Portable 1 View  01/24/2014   CLINICAL DATA:  Weakness.  History of diabetes.  EXAM: PORTABLE CHEST - 1 VIEW  COMPARISON:  One-view chest 06/27/2013.  FINDINGS: The heart is enlarged. There is no edema or effusion to suggest failure. No focal airspace disease is present. Pacing wires are stable. Advanced degenerative changes in the shoulders is again noted.  IMPRESSION: 1. Cardiomegaly without failure. 2. No acute cardiopulmonary disease.   Electronically Signed   By: Thayer Ohm  Mattern M.D.   On: 01/24/2014 21:28    ECG  Atrial fibrillation with occasional paced complex, heart rate 105.  ASSESSMENT AND PLAN  1. Generalized weakness  - negative CT of head, likely due to multiple comorbidities including deconditioning, recent bacteremia and a-fib  - further workup per IM  2. A-fib  - continue rate control with coreg, increase if BP allows  - Not a candidate for systemic anti-coagulation due to multiple comorbidities and her frequent fall  3. recent bacteremia - on Invanz with PICC line  4. S/p pacemaker 5. combined systolic and diastolic heart failure  - no active sign of acute exacerbation of CHF  - continue diuretic  - Echo/15/2014, EF 30-35%, grade 2 diastolic dysfunction 6. CKD 7. HTN 8. Hyperlipidemia 9. CAD s/p stent and CABG 2004  - likely not a candidate for aggressive therapy  - would continue medical management  Signed, Azalee CourseMeng, Hao, PA-C 02/04/2014, 4:43 PM As above, patient seen and examined. Briefly she is an 80 year old female with past medical history of paroxysmal atrial fibrillation, diabetes mellitus, prior pacemaker placement, coronary artery disease with prior coronary artery bypass and graft, and hypertension, hyperlipidemia, heparin-induced thrombocytopenia, and ischemic cardiomyopathy for evaluation of atrial fibrillation. Patient recently discharged  following admission for ESBL E Coli. Patient discharged on home antibiotics. She has chronic weakness and unsteady gait. She also has chronic dyspnea on exertion. This morning she was so weak she could not stand. She has had mild increased dyspnea. She denies palpitations, syncope, fevers or chills. She has been admitted by primary care. She is noted to be in atrial fibrillation and cardiology asked to evaluate. Note she has previously been felt not to be a candidate for anticoagulation because of recurrent falls. Therefore would recommend rate control and aspirin. It is not clear that she is symptomatic from her atrial fibrillation. Continue amiodarone and carvedilol. Follow heart rate and adjust medications as needed. Continue present dose of Lasix. Note patient is extremely frail with failure to thrive. No ACE inhibitor given renal insufficiency. Olga MillersBrian Crenshaw

## 2014-02-04 NOTE — Progress Notes (Signed)
Attempted to call report x3, no answer from ED.

## 2014-02-04 NOTE — ED Notes (Signed)
Pt. Was admitted on the 15th of June for weakness and she discharged herself on the 19th of June.   She continues to have generalized weakness but today she was unable to walk with her walker.  She is alert and oriented X3.  Has generalized pain , no different then her normal . Skin is pink warm and dry

## 2014-02-04 NOTE — ED Notes (Addendum)
Infusion stopped pending hospitalist order for cultures per PA-C.

## 2014-02-04 NOTE — ED Notes (Signed)
DELAY IN MOVING PT TO THE FLOOR DUE TO ADMITTING DOCTORS IN AND OUT OF ROOM

## 2014-02-04 NOTE — ED Notes (Signed)
Restart abx infusion per PA-C Piepenbrink.

## 2014-02-04 NOTE — ED Notes (Signed)
Daughters are at bedside, reports pt was normal last night, awoke at 0900 with generalized weakness.

## 2014-02-04 NOTE — H&P (Signed)
[facility] History and Physical  [name] [name]:[id number] DOB: 12-09-1924 DOA: 02/04/2014  Referring physician: Dr. Harlon Ditty PCP: Josue Hector, MD   Chief Complaint: weakness  HPI: [name] [name] is a 80 y.o. female  Past medical history of ESBL bacteremia currently on Invanz (stop date 02/10/2014), renal artery stenosis, coronary artery disease with LAD stent on 99 CABG in 2004, combined heart failure, atrial fibrillation on amiodarone on aspirin status post [facility] in 2013 and chronic kidney disease that woke up this morning with generalized weakness. She couldn't get out of bed. She cannot eat this morning, she denies any fevers or chills. The family relates she was more confused this morning and patient was able to ambulate she felt fatigue. She also relates she's had a been having little bit of drooling of saliva on the right side. She does have some aphasia. Consulted for further evaluation.  In the ED: Basic metabolic panel CBC lactic acid percent cardiac enzymes are within normal limits CT scan of the head show as below   Review of Systems:  Constitutional:  No weight loss, night sweats, Fevers, chills, fatigue.  HEENT:  No headaches, Difficulty swallowing,Tooth/dental problems,Sore throat,  No sneezing, itching, ear ache, nasal congestion, post nasal drip,  Cardio-vascular:  No chest pain, Orthopnea, PND, swelling in lower extremities, anasarca, dizziness, palpitations  GI:  No heartburn, indigestion, abdominal pain, nausea, vomiting, diarrhea, change in bowel habits, loss of appetite  Resp:  No shortness of breath with exertion or at rest. No excess mucus, no productive cough, No non-productive cough, No coughing up of blood.No change in color of mucus.No wheezing.No chest wall deformity  Skin:  no rash or lesions.  GU:  no dysuria, change in color of urine, no urgency or frequency. No flank pain.  Musculoskeletal:  No joint pain or swelling. No  decreased range of motion. No back pain.  Psych:  No change in mood or affect. No depression or anxiety. No memory loss.   Past Medical History  Diagnosis Date  . Diabetes mellitus without complication   . Chest pain   . DJD (degenerative joint disease)   . Tachycardia-bradycardia syndrome   . CKD (chronic kidney disease)   . Cardiomegaly   . Coronary artery disease     a. LAD stent 1999 after NSTEMI. b. CABG 2004. c. Last LHC 02/2010 - patent grafts.  . Hypertension   . Hyperlipidemia   . Carotid artery disease     status post right carotid endarterectomy.  . Degenerative joint disease   . Restless leg syndrome   . Chronic renal insufficiency   . Heparin induced thrombocytopenia   . Diphtheria     "as a child"  . Pleural effusion, bilateral 06/2003  . Pseudoaneurysm     status post repair following catherization in 1999  . Chronic systolic heart failure 10/02/11  . Ischemic cardiomyopathy     Echocardiogram 10/01/11: Mild LVH, EF 35-40%, basal inferior, basal to mid posterior and basal to mid anterolateral severe hypokinesis, mild to moderate AI, moderate MR (ischemic MR), moderate LAE, mild RVE, mildly reduced RV systolic function, mild RAE, PASP 43-44  . COPD (chronic obstructive pulmonary disease)   . Atrial fibrillation/flutter     s/p multiple DCCVs;  amiodarone started 09/2011, TEE DCCV 3/13;  amio and coumadin d/c'd after admxn to [facility] 02/2012 with frequent falls/syncope  . Tachycardia-bradycardia syndrome     s/p Medtronic pacemaker implant 09/2011 (8 sec pause noted)  . GERD (gastroesophageal reflux  disease)   . Renal artery stenosis     bilateral- status post stenting  . Non-functioning kidney   . Depression     "cries alot"  . Gout   . Hypothyroidism   . Anemia    Past Surgical History  Procedure Laterality Date  . Pacemaker insertion    . Carotid endarterectomy  05/2003    right  . Pacemaker insertion  10/02/11    implanted by Dr Johney Frame  . Cholecystectomy   ?02/2011  . Tonsillectomy and adenoidectomy    . Renal artery stent  06/2003    bilaterally/e-chart  . Thoracentesis  ? 2000; 05/2011  . Coronary angioplasty with stent placement  1999  . Av fistula repair  01/1998    w/pseudoaneurysm repair S/P catheterization/E-chart  . Coronary artery bypass graft  05/2003    CABG X 3  . Tee without cardioversion  10/23/2011    Procedure: TRANSESOPHAGEAL ECHOCARDIOGRAM (TEE);  Surgeon: Wendall Stade, MD;  Location: [facility] ENDOSCOPY;  Service: Cardiovascular;  Laterality: N/A;  . Cardioversion  10/23/2011    Procedure: CARDIOVERSION;  Surgeon: Wendall Stade, MD;  Location: [facility] ENDOSCOPY;  Service: Cardiovascular;  Laterality: N/A;   Social History:  reports that she has never smoked. She has never used smokeless tobacco. She reports that she does not drink alcohol or use illicit drugs.  Allergies  Allergen Reactions  . Heparin Other (See Comments)    Clots blood instead of thinning  . Hydromorphone Hcl Other (See Comments)    Crazy feeling  . Warfarin And Related Other (See Comments)    Syncope events  . Altace [Ramipril] Hives  . Contrast Media [Iodinated Diagnostic Agents] Other (See Comments)    Reaction noted by md- might have caused aneurism in 1999  . Morphine And Related Nausea And Vomiting  . Plavix [Clopidogrel Bisulfate] Hives and Itching    Family History  Problem Relation Age of Onset  . Cancer Mother 12    died  . Lung disease Father 44    died  . Heart disease Sister     2 sisters and her son  . Anesthesia problems Neg Hx      Prior to Admission medications   Medication Sig Start Date End Date Taking? Authorizing Khadejah Son  ALPRAZolam (XANAX) 0.25 MG tablet Take 0.25 mg by mouth 2 (two) times daily.   Yes Historical Greer Koeppen, MD  amiodarone (PACERONE) 200 MG tablet Take 100 mg by mouth daily.   Yes Historical Keaden Gunnoe, MD  aspirin EC 81 MG tablet Take 81 mg by mouth daily.   Yes Historical Pepper Wyndham, MD  atorvastatin (LIPITOR)  10 MG tablet Take 10 mg by mouth at bedtime.   Yes Historical Marquette Piontek, MD  carvedilol (COREG) 12.5 MG tablet Take 12.5 mg by mouth 2 (two) times daily with a meal.   Yes Historical Sten Dematteo, MD  Cholecalciferol 1000 UNITS capsule Take 1,000 Units by mouth daily.   Yes Historical Datrell Dunton, MD  cyanocobalamin (,VITAMIN B-12,) 1000 MCG/ML injection Inject 1,000 mcg into the muscle every 30 (thirty) days. Done at Dr. Joyce Copa office   Yes Historical Xian Alves, MD  docusate sodium (COLACE) 100 MG capsule Take 200 mg by mouth 2 (two) times daily.    Yes Historical Marva Hendryx, MD  Ertapenem Sodium (INVANZ IV) Inject 500 mg into the vein See admin instructions. Invanz 500 mg in 100 ml NS (provided by [facility], [place], [place]) Allow to warm to room temperature.  Infuse over 30 minutes via  gravity tubing every 24 hours as directed (10 am every morning) Set drip rate at 50 drops per minute (change tubing once every 24 hours) Start date June 18th, stop date July 1st   Yes Historical Eh Sauseda, MD  furosemide (LASIX) 40 MG tablet Take 40 mg by mouth daily.   Yes Historical Nitasha Jewel, MD  HYDROcodone-acetaminophen (NORCO) 10-325 MG per tablet Take 1 tablet by mouth every 6 (six) hours. scheduled   Yes Historical Aislynn Cifelli, MD  isosorbide mononitrate (IMDUR) 60 MG 24 hr tablet Take 1 tablet (60 mg total) by mouth daily. 11/27/12  Yes Rhonda G Barrett, PA-C  lactose free nutrition (BOOST) LIQD Take 237 mLs by mouth 3 (three) times daily between meals.   Yes Historical Sheri Prows, MD  levothyroxine (SYNTHROID, LEVOTHROID) 88 MCG tablet Take 88 mcg by mouth daily before breakfast.   Yes Historical Meiko Ives, MD  nitroGLYCERIN (NITROSTAT) 0.4 MG SL tablet Place 0.4 mg under the tongue every 5 (five) minutes as needed for chest pain.   Yes Historical Gideon Burstein, MD  omeprazole (PRILOSEC) 40 MG capsule Take 1 capsule (40 mg total) by mouth daily. 11/27/12  Yes Rhonda G Barrett, PA-C  potassium chloride (K-DUR,KLOR-CON) 10  MEQ tablet Take 10 mEq by mouth daily.   Yes Historical Dejanay Wamboldt, MD  Tamsulosin HCl (FLOMAX) 0.4 MG CAPS Take 1 capsule (0.4 mg total) by mouth daily. 08/29/12  Yes Dayna N Dunn, PA-C  sodium chloride 0.9 % injection 10-40 mLs by Intracatheter route as needed (flush). 01/29/14   Ripudeep Jenna Luo, MD   Physical Exam: Filed Vitals:   02/04/14 1500  BP: 131/54  Pulse: 43  Temp:   Resp: 23    BP 131/54  Pulse 43  Temp(Src) 98.6 F (37 C) (Oral)  Resp 23  SpO2 98%  General:  Appears calm and comfortable Eyes: PERRL, normal lids, irises & conjunctiva ENT: grossly normal hearing, lips & tongue Neck: no LAD, masses or thyromegaly Cardiovascular: RRR, no m/r/g. No LE edema. Telemetry: SR, no arrhythmias  Respiratory: CTA bilaterally, no w/r/r. Normal respiratory effort. Abdomen: soft, ntnd Skin: no rash or induration seen on limited exam Musculoskeletal: grossly normal tone BUE/BLE Psychiatric: grossly normal mood and affect, speech fluent and appropriate Neurologic: She has some facial symmetry over the facial droop on the left side. Muscle strength is followed by all 4 extremities and symmetrical deep tendon reflexes is 2+ bilaterally and symmetrical. 2-12 are grossly intact sensation is intact throughout. Sensation in her space is intact. 10,11 and 12 are intact.           Labs on Admission:  Basic Metabolic Panel:  Recent Labs Lab 01/29/14 0400 02/04/14 1238  NA 137 138  K 3.8 4.7  CL 96 98  CO2 26 25  GLUCOSE 105* 107*  BUN 26* 35*  CREATININE 1.41* 1.49*  CALCIUM 9.6 9.8   Liver Function Tests:  Recent Labs Lab 01/29/14 0400 02/04/14 1238  AST 32 19  ALT 89* 20  ALKPHOS 133* 113  BILITOT 0.5 0.4  PROT 6.8 7.3  ALBUMIN 3.2* 3.6    Recent Labs Lab 02/04/14 1238  LIPASE 32    Recent Labs Lab 02/04/14 1238  AMMONIA 16   CBC:  Recent Labs Lab 01/29/14 0400 02/04/14 1238  WBC 6.6 7.9  NEUTROABS  --  5.5  HGB 11.3* 11.3*  HCT 35.1* 35.3*  MCV  92.4 92.4  PLT 161 223   Cardiac Enzymes:  Recent Labs Lab 02/04/14 1238  TROPONINI <0.30  BNP (last 3 results)  Recent Labs  01/26/14 1030  PROBNP 9027.0*   CBG:  Recent Labs Lab 01/28/14 1642 01/28/14 2027 01/29/14 0740 01/29/14 1122 02/04/14 1307  GLUCAP 144* 82 102* 156* 103*    Radiological Exams on Admission: Ct Head Wo Contrast  02/04/2014   CLINICAL DATA:  Right leg weakness.  EXAM: CT HEAD WITHOUT CONTRAST  TECHNIQUE: Contiguous axial images were obtained from the base of the skull through the vertex without intravenous contrast.  COMPARISON:  CT scan of August 04, 2012.  FINDINGS: Bony calvarium appears intact. Mild diffuse cortical atrophy is noted. Mild chronic ischemic white matter disease is noted. No mass effect or midline shift is noted. Ventricular size is within normal limits. There is no evidence of mass lesion, hemorrhage or acute infarction.  IMPRESSION: Mild diffuse cortical atrophy. Mild chronic ischemic white matter disease. No acute intracranial abnormality seen.   Electronically Signed   By: Roque LiasJames  Green M.D.   On: 02/04/2014 14:05    EKG: Independently reviewed. Atrial fibrillation normal axis and nonspecific T wave changes.  Assessment/Plan Weakness - He generalized weakness, she doesn't have a fever or leukocytosis. She does have a little bit of facial asymmetry and has been complaining of some drooling of saliva on the right side of her face, she's also has a little bit of slurred speech. - Lactic acid < 2.0, 1sr cardiac enzymes negative x1. - HgbA1c, fasting lipid panel  - She is not a candidate for MRI as she is DCCV. Might need to repeat a CT within 24 hours. - PT consult, OT consult, Speech consult  - Carotid dopplers  - Prophylactic therapy-Antiplatelet med: She is on aspirin 81 mg, non-Coumadin due to frequent falls. - Avoid D5 fluids as may be harmfull - risk factor modification  - Cardiac Monitoring  - Neurochecks q4h  - Keep  MAP 70 - Tobacco counseling cessation.  Tachycardia-bradycardia syndrome - Currently on atrial fibrillation. - Consult cards. Continue Coreg and amiodarone.  Chronic systolic CHF (congestive heart failure): - Euvolemic. - Continue Coreg.  CKD (chronic kidney disease), stage III - Creatinine at baseline.  Normocytic Anemia - At baseline.   Code Status: DNR/DNI Family Communication: none Disposition Plan: inpatinet  Time spent: 5185 mminutes  FELIZ Rosine BeatORTIZ, ABRAHAM [facility] Pager [phone]  **Disclaimer: This note may have been dictated with voice recognition software. Similar sounding words can inadvertently be transcribed and this note may contain transcription errors which may not have been corrected upon publication of note.**

## 2014-02-04 NOTE — ED Provider Notes (Signed)
CSN: [id number]     Arrival date & time 02/04/14  1042 History   First MD Initiated Contact with Patient 02/04/14 1114     Chief Complaint  Patient presents with  . Weakness     (Consider location/radiation/quality/duration/timing/severity/associated sxs/prior Treatment) HPI Comments: Patient is an 80 yo F PMHx significant for HTN, HLD, CAD, Carotid artery disease, CHF, Cardiomyopathy, A Fib, renal artery stenosis, depression, anemia presenting to the ED from home for increased generalized weakness and fatigue, decreased PO intake, night sweats, subjective fever and chills after being discharged from hospital on June 19th for E. Coli bacteremia on Invanz 0.5g via PICC line. The family states the patient has been more confused as well since being home. Patient is now unable to ambulate d/t weakness. Attempted to get up this morning and fell to the ground d/t weakness and fatigue. Endorses associated decreased sensation to left leg that she awoke with this morning, states she has had prior intermittent episodes of this in the past.   Patient is a 80 y.o. female presenting with weakness.  Weakness Associated symptoms include chills, fatigue and weakness. Pertinent negatives include no abdominal pain, chest pain or vomiting.    Past Medical History  Diagnosis Date  . Diabetes mellitus without complication   . Chest pain   . DJD (degenerative joint disease)   . Tachycardia-bradycardia syndrome   . CKD (chronic kidney disease)   . Cardiomegaly   . Coronary artery disease     a. LAD stent 1999 after NSTEMI. b. CABG 2004. c. Last LHC 02/2010 - patent grafts.  . Hypertension   . Hyperlipidemia   . Carotid artery disease     status post right carotid endarterectomy.  . Degenerative joint disease   . Restless leg syndrome   . Chronic renal insufficiency   . Heparin induced thrombocytopenia   . Diphtheria     "as a child"  . Pleural effusion, bilateral 06/2003  . Pseudoaneurysm     status  post repair following catherization in 1999  . Chronic systolic heart failure 10/02/11  . Ischemic cardiomyopathy     Echocardiogram 10/01/11: Mild LVH, EF 35-40%, basal inferior, basal to mid posterior and basal to mid anterolateral severe hypokinesis, mild to moderate AI, moderate MR (ischemic MR), moderate LAE, mild RVE, mildly reduced RV systolic function, mild RAE, PASP 43-44  . COPD (chronic obstructive pulmonary disease)   . Atrial fibrillation/flutter     s/p multiple DCCVs;  amiodarone started 09/2011, TEE DCCV 3/13;  amio and coumadin d/c'd after admxn to [facility] 02/2012 with frequent falls/syncope  . Tachycardia-bradycardia syndrome     s/p Medtronic pacemaker implant 09/2011 (8 sec pause noted)  . GERD (gastroesophageal reflux disease)   . Renal artery stenosis     bilateral- status post stenting  . Non-functioning kidney   . Depression     "cries alot"  . Gout   . Hypothyroidism   . Anemia    Past Surgical History  Procedure Laterality Date  . Pacemaker insertion    . Carotid endarterectomy  05/2003    right  . Pacemaker insertion  10/02/11    implanted by Dr Johney Frame  . Cholecystectomy  ?02/2011  . Tonsillectomy and adenoidectomy    . Renal artery stent  06/2003    bilaterally/e-chart  . Thoracentesis  ? 2000; 05/2011  . Coronary angioplasty with stent placement  1999  . Av fistula repair  01/1998    w/pseudoaneurysm repair S/P catheterization/E-chart  . Coronary artery  bypass graft  05/2003    CABG X 3  . Tee without cardioversion  10/23/2011    Procedure: TRANSESOPHAGEAL ECHOCARDIOGRAM (TEE);  Surgeon: Wendall Stade, MD;  Location: [facility] ENDOSCOPY;  Service: Cardiovascular;  Laterality: N/A;  . Cardioversion  10/23/2011    Procedure: CARDIOVERSION;  Surgeon: Wendall Stade, MD;  Location: [facility] ENDOSCOPY;  Service: Cardiovascular;  Laterality: N/A;   Family History  Problem Relation Age of Onset  . Cancer Mother 77    died  . Lung disease Father 39    died  . Heart disease  Sister     2 sisters and her son  . Anesthesia problems Neg Hx    History  Substance Use Topics  . Smoking status: Never Smoker   . Smokeless tobacco: Never Used  . Alcohol Use: No   OB History   Grav Para Term Preterm Abortions TAB SAB Ect Mult Living                 Review of Systems  Constitutional: Positive for chills, appetite change and fatigue.       Night sweats  Respiratory: Negative for shortness of breath.   Cardiovascular: Negative for chest pain.  Gastrointestinal: Negative for vomiting and abdominal pain.  Musculoskeletal: Positive for gait problem.  Neurological: Positive for weakness.  All other systems reviewed and are negative.     Allergies  Heparin; Hydromorphone hcl; Warfarin and related; Altace; Contrast media; Morphine and related; and Plavix  Home Medications   Prior to Admission medications   Medication Sig Start Date End Date Taking? Authorizing Brack Shaddock  ALPRAZolam (XANAX) 0.25 MG tablet Take 0.25 mg by mouth 2 (two) times daily.   Yes Historical Betty Daidone, MD  amiodarone (PACERONE) 200 MG tablet Take 100 mg by mouth daily.   Yes Historical Brandan Robicheaux, MD  aspirin EC 81 MG tablet Take 81 mg by mouth daily.   Yes Historical Bernie Fobes, MD  atorvastatin (LIPITOR) 10 MG tablet Take 10 mg by mouth at bedtime.   Yes Historical Larysa Pall, MD  carvedilol (COREG) 12.5 MG tablet Take 12.5 mg by mouth 2 (two) times daily with a meal.   Yes Historical Kanden Carey, MD  Cholecalciferol 1000 UNITS capsule Take 1,000 Units by mouth daily.   Yes Historical Eliza Green, MD  cyanocobalamin (,VITAMIN B-12,) 1000 MCG/ML injection Inject 1,000 mcg into the muscle every 30 (thirty) days. Done at Dr. Joyce Copa office   Yes Historical Alea Ryer, MD  docusate sodium (COLACE) 100 MG capsule Take 200 mg by mouth 2 (two) times daily.    Yes Historical Camara Renstrom, MD  Ertapenem Sodium (INVANZ IV) Inject 500 mg into the vein See admin instructions. Invanz 500 mg in 100 ml NS (provided by  [facility], [place], ) Allow to warm to room temperature.  Infuse over 30 minutes via gravity tubing every 24 hours as directed (10 am every morning) Set drip rate at 50 drops per minute (change tubing once every 24 hours) Start date June 18th, stop date July 1st   Yes Historical Chaska Hagger, MD  furosemide (LASIX) 40 MG tablet Take 40 mg by mouth daily.   Yes Historical Tikesha Mort, MD  HYDROcodone-acetaminophen (NORCO) 10-325 MG per tablet Take 1 tablet by mouth every 6 (six) hours. scheduled   Yes Historical Varshini Arrants, MD  isosorbide mononitrate (IMDUR) 60 MG 24 hr tablet Take 1 tablet (60 mg total) by mouth daily. 11/27/12  Yes Rhonda G Barrett, PA-C  lactose free nutrition (BOOST) LIQD Take 237 mLs by  mouth 3 (three) times daily between meals.   Yes Historical Fishel Wamble, MD  levothyroxine (SYNTHROID, LEVOTHROID) 88 MCG tablet Take 88 mcg by mouth daily before breakfast.   Yes Historical Esau Fridman, MD  nitroGLYCERIN (NITROSTAT) 0.4 MG SL tablet Place 0.4 mg under the tongue every 5 (five) minutes as needed for chest pain.   Yes Historical Gemayel Mascio, MD  omeprazole (PRILOSEC) 40 MG capsule Take 1 capsule (40 mg total) by mouth daily. 11/27/12  Yes Rhonda G Barrett, PA-C  potassium chloride (K-DUR,KLOR-CON) 10 MEQ tablet Take 10 mEq by mouth daily.   Yes Historical Kiaraliz Rafuse, MD  Tamsulosin HCl (FLOMAX) 0.4 MG CAPS Take 1 capsule (0.4 mg total) by mouth daily. 08/29/12  Yes Dayna N Dunn, PA-C  sodium chloride 0.9 % injection 10-40 mLs by Intracatheter route as needed (flush). 01/29/14   Ripudeep K Rai, MD   BP 107/59  Pulse 72  Temp(Src) 98.3 F (36.8 C) (Oral)  Resp 16  SpO2 99% Physical Exam  Nursing note and vitals reviewed. Constitutional: She is oriented to person, place, and time. Vital signs are normal. She appears well-developed and well-nourished. She appears listless. She is cooperative. No distress.  HENT:  Head: Normocephalic and atraumatic.  Right Ear: External ear normal.  Left  Ear: External ear normal.  Nose: Nose normal.  Eyes: Conjunctivae are normal.  Neck: Normal range of motion. Neck supple.  Cardiovascular: Normal rate, normal heart sounds and intact distal pulses.   Pulmonary/Chest: Effort normal and breath sounds normal. No respiratory distress.  Abdominal: Soft. Bowel sounds are normal. There is no tenderness.  Musculoskeletal: Normal range of motion.  Neurological: She is oriented to person, place, and time. She appears listless. She is not disoriented. GCS eye subscore is 4. GCS verbal subscore is 5. GCS motor subscore is 6.  Able to move all extremities w/o ataxia.  Unable to sit patient up or evaluate gait d/t weakness.  Subjective decreased sensation to left lower extremity.   Skin: Skin is warm and dry. She is not diaphoretic.  Psychiatric: She has a normal mood and affect.    ED Course  Procedures (including critical care time) Medications  oxyCODONE-acetaminophen (PERCOCET/ROXICET) 5-325 MG per tablet 2 tablet (2 tablets Oral Given 02/04/14 1254)  ertapenem (INVANZ) 0.5 g in sodium chloride 0.9 % 50 mL IVPB (0.5 g Intravenous New Bag/Given 02/04/14 1421)    Labs Review Labs Reviewed  CBC WITH DIFFERENTIAL - Abnormal; Notable for the following:    RBC 3.82 (*)    Hemoglobin 11.3 (*)    HCT 35.3 (*)    All other components within normal limits  COMPREHENSIVE METABOLIC PANEL - Abnormal; Notable for the following:    Glucose, Bld 107 (*)    BUN 35 (*)    Creatinine, Ser 1.49 (*)    GFR calc non Af Amer 31 (*)    GFR calc Af Amer 35 (*)    All other components within normal limits  CBG MONITORING, ED - Abnormal; Notable for the following:    Glucose-Capillary 103 (*)    All other components within normal limits  URINE CULTURE  LIPASE, BLOOD  TROPONIN I  AMMONIA  URINALYSIS, ROUTINE W REFLEX MICROSCOPIC  I-STAT CG4 LACTIC ACID, ED    Imaging Review Ct Head Wo Contrast  02/04/2014   CLINICAL DATA:  Right leg weakness.  EXAM: CT  HEAD WITHOUT CONTRAST  TECHNIQUE: Contiguous axial images were obtained from the base of the skull through the vertex without intravenous contrast.  COMPARISON:  CT scan of August 04, 2012.  FINDINGS: Bony calvarium appears intact. Mild diffuse cortical atrophy is noted. Mild chronic ischemic white matter disease is noted. No mass effect or midline shift is noted. Ventricular size is within normal limits. There is no evidence of mass lesion, hemorrhage or acute infarction.  IMPRESSION: Mild diffuse cortical atrophy. Mild chronic ischemic white matter disease. No acute intracranial abnormality seen.   Electronically Signed   By: Roque LiasJames  Green M.D.   On: 02/04/2014 14:05     EKG Interpretation None      MDM   Final diagnoses:  Generalized weakness    Filed Vitals:   02/04/14 1538  BP: 107/59  Pulse: 72  Temp: 98.3 F (36.8 C)  Resp: 16   Afebrile, NAD, non-toxic appearing, AAOx4. Patient is fatigued appearing. Decreased subjective left lower leg sensation, symptoms were present upon awakening this AM. Otherwise no cranial nerve deficits and moves all extremities w/o ataxia. Patient with too great generalized weakness to sit up or ambulate. CT scan reviewed w/o acute finding. Labs reviewed. Given patient's symptoms will admit to the hospital service for further management. Patient d/w with Dr. Fayrene FearingJames, agrees with plan.       Jeannetta EllisJennifer L Piepenbrink, PA-C 02/04/14 1549

## 2014-02-04 NOTE — ED Notes (Signed)
REPORT CALLED. PT TO 3W ON MONITOR WITH HER BAG OF BELONGINGS

## 2014-02-05 DIAGNOSIS — G459 Transient cerebral ischemic attack, unspecified: Secondary | ICD-10-CM

## 2014-02-05 LAB — GLUCOSE, CAPILLARY
GLUCOSE-CAPILLARY: 104 mg/dL — AB (ref 70–99)
Glucose-Capillary: 104 mg/dL — ABNORMAL HIGH (ref 70–99)
Glucose-Capillary: 175 mg/dL — ABNORMAL HIGH (ref 70–99)
Glucose-Capillary: 148 mg/dL — ABNORMAL HIGH (ref 70–99)
Glucose-Capillary: 128 mg/dL — ABNORMAL HIGH (ref 70–99)

## 2014-02-05 LAB — LIPID PANEL
Cholesterol: 113 mg/dL (ref 0–200)
HDL: 30 mg/dL — AB (ref >39)
LDL Cholesterol: 59 mg/dL (ref 0–99)
TRIGLYCERIDES: 119 mg/dL (ref <150)
Total CHOL/HDL Ratio: 3.8 RATIO
VLDL: 24 mg/dL (ref 0–40)

## 2014-02-05 LAB — RAPID URINE DRUG SCREEN, HOSP PERFORMED
Amphetamines: NOT DETECTED
BARBITURATES: NOT DETECTED
Benzodiazepines: NOT DETECTED
Cocaine: NOT DETECTED
Opiates: POSITIVE — AB
Tetrahydrocannabinol: NOT DETECTED

## 2014-02-05 LAB — HEMOGLOBIN A1C
Hgb A1c MFr Bld: 6.5 % — ABNORMAL HIGH (ref <5.7)
Mean Plasma Glucose: 140 mg/dL — ABNORMAL HIGH (ref <117)

## 2014-02-05 MED ORDER — ALPRAZOLAM 0.25 MG PO TABS
0.2500 mg | ORAL_TABLET | Freq: Two times a day (BID) | ORAL | Status: DC | PRN
  Administered 2014-02-05 – 2014-02-07 (×4): 0.25 mg via ORAL
  Filled 2014-02-05 (×4): qty 1

## 2014-02-05 NOTE — Evaluation (Signed)
Physical Therapy Evaluation Patient Details Name: ROSALIA WESTOVER MRN: [id number] DOB: 1925-07-30 Today's Date: 02/05/2014   History of Present Illness  Past medical history of ESBL bacteremia currently on Invanz, renal artery stenosis, coronary artery disease with LAD stent on 99 CABG in 2004, combined heart failure, atrial fibrillation,chronic kidney disease that woke up 6/25 with generalized weakness  Clinical Impression  Pt pleasant but demonstrating decreased strength and ability with basic transfers, balance and gait. Pt will need 24hr assist at DC which she states family can provide but if not would need ST-SNF which pt states she will refuse. Pt fatigued quickly today and will benefit from acute therapy to maximize mobility, function, safety and independence to decrease burden of care.     Follow Up Recommendations Home health PT;Supervision/Assistance - 24 hour    Equipment Recommendations  None recommended by PT    Recommendations for Other Services       Precautions / Restrictions Precautions Precautions: Fall      Mobility  Bed Mobility Overal bed mobility: Needs Assistance Bed Mobility: Supine to Sit     Supine to sit: Supervision     General bed mobility comments: pt able to transfer supine to sit with rail and required max assist to scoot to EOB with cueing and reciprocal scooting  Transfers Overall transfer level: Needs assistance   Transfers: Stand Pivot Transfers;Squat Pivot Transfers   Stand pivot transfers: Min assist Squat pivot transfers: Min assist     General transfer comment: cues for hand placement, increased time and pt only able to use RUE to perform transfers with assist to guide pelvis to pivot  Ambulation/Gait Ambulation/Gait assistance: Min assist Ambulation Distance (Feet): 30 Feet Assistive device: Rolling walker (2 wheeled) Gait Pattern/deviations: Shuffle;Narrow base of support;Trunk flexed   Gait velocity interpretation: Below  normal speed for age/gender    Stairs            Wheelchair Mobility    Modified Rankin (Stroke Patients Only)       Balance Overall balance assessment: Needs assistance   Sitting balance-Leahy Scale: Poor Sitting balance - Comments: pt EOB with posterior lean and assist to maintain upright despite cues Postural control: Posterior lean   Standing balance-Leahy Scale: Poor                               Pertinent Vitals/Pain No pain sats 98% on RA    Home Living Family/patient expects to be discharged to:: Private residence Living Arrangements: Children Available Help at Discharge: Family;Available 24 hours/day Type of Home: House Home Access: Stairs to enter Entrance Stairs-Rails: Right Entrance Stairs-Number of Steps: 2 Home Layout: One level Home Equipment: Walker - 2 wheels;Cane - single point Additional Comments: doesn't use devices in home and goes out with family assist    Prior Function Level of Independence: Independent         Comments: Pt reports she sponge bathes on her own, dgtr does all the household chores and pt doesn't drive. pt with limited LUE function due to prior rotator cuff tear     Hand Dominance        Extremity/Trunk Assessment   Upper Extremity Assessment: LUE deficits/detail       LUE Deficits / Details: history of rotator cuff tear with limited ROM at baseline   Lower Extremity Assessment: Generalized weakness      Cervical / Trunk Assessment: Kyphotic  Communication   Communication:  No difficulties  Cognition Arousal/Alertness: Awake/alert Behavior During Therapy: WFL for tasks assessed/performed Overall Cognitive Status: Impaired/Different from baseline Area of Impairment: Safety/judgement         Safety/Judgement: Decreased awareness of deficits;Decreased awareness of safety          General Comments      Exercises        Assessment/Plan    PT Assessment Patient needs continued  PT services  PT Diagnosis     PT Problem List Decreased strength;Decreased balance;Decreased mobility;Decreased activity tolerance;Decreased knowledge of use of DME;Decreased safety awareness  PT Treatment Interventions Gait training;DME instruction;Stair training;Functional mobility training;Therapeutic activities;Therapeutic exercise;Patient/family education   PT Goals (Current goals can be found in the Care Plan section) Acute Rehab PT Goals Patient Stated Goal: return home PT Goal Formulation: With patient Time For Goal Achievement: 02/19/14 Potential to Achieve Goals: Good    Frequency Min 3X/week   Barriers to discharge Decreased caregiver support pt reports kids can provide 24hr assist, family not present to confirm and prior admission states only PRN assist    Co-evaluation               End of Session   Activity Tolerance: Patient limited by fatigue Patient left: in bed;with call bell/phone within reach;with bed alarm set (no chair alarms present for OOB to chair) Nurse Communication: Mobility status    Functional Assessment Tool Used: clinical judgement Functional Limitation: Mobility: Walking and moving around Mobility: Walking and Moving Around Current Status ([id number]): At least 20 percent but less than 40 percent impaired, limited or restricted Mobility: Walking and Moving Around Goal Status [phone]): At least 1 percent but less than 20 percent impaired, limited or restricted    Time: 1001-1021 PT Time Calculation (min): 20 min   Charges:   PT Evaluation $Initial PT Evaluation Tier I: 1 Procedure PT Treatments $Therapeutic Activity: 8-22 mins   PT G Codes:   Functional Assessment Tool Used: clinical judgement Functional Limitation: Mobility: Walking and moving around    Delorse Lekabor, Maija Beth 02/05/2014, 10:30 AM Delaney MeigsMaija Tabor Prater, PT [phone]

## 2014-02-05 NOTE — Progress Notes (Signed)
VASCULAR LAB PRELIMINARY  PRELIMINARY  PRELIMINARY  PRELIMINARY  Carotid duplex completed.    Preliminary report:  Right - 1% to 39 % ICA stenosis lower end of range. Vertebral artery flow is antegrade. Left - 1% to 39% ICA stenosis upper end of range. Unable to visualize well enough to evaluate  SLAUGHTER, [place]IR[place]IA, RVS 02/05/2014, 9:37 PM

## 2014-02-05 NOTE — Progress Notes (Signed)
[facility] PROGRESS NOTE  [name] NipVictoria E [name] ZOX:[id number] DOB: [birth year] DOA: 02/04/2014 PCP: Josue HectorNYLAND,LEONARD ROBERT, MD  Assessment/Plan: 80 y.o. female with DM, CAD s/p CABG, HTN, HLD, CHF, PAF not on AC (risk of fall, bleeding), s/p PPM, dementia, recently admitted for sepsis, bacteremia on IV atx (lasty day 02/10/14) presented with generalized weakness, facial asymmetry some aphasia  1. Generalized  Weakness likely deconditioning due to worsening chronic medical illness, recent sepsis, bacteremia  -cont supportive care, PT eval; Pt would benefit from SNF, but refusing, family would like to talk to her again  2. ? Facial asymmetry, aphasia, not present on exam; neuro exam no focal; -Pt denies aphasia, WJ:XBJYCT:Mild diffuse cortical atrophy. Mild chronic ischemic white matter  disease. No acute intracranial abnormality seen -? Benzo related; changed xanax to prn; underlying worsening, deconditioning; cont ASA, statin, PT; pend carotids 3. A-fib, s/p PPM; cont Amio 100mg  daily. coreg 12.5bid  - Not a candidate for systemic anti-coagulation due to multiple comorbidities and her frequent fall  4. Recent bacteremia - on Invanz; afebrile;  WBCs WNL; cont atx last day 02/10/14 5. Combined systolic and diastolic heart failure;  no active sign of acute exacerbation of CHF  - resume Lasix as needed; Echo/15/2014, EF 30-35%, grade 2 diastolic dysfunction  -pend repeat echo  6. CKD: SCr/BUN have increased compared to 01/24/14(1.29)  7. CAD s/p stent and CABG 2004  - No complaints of angina. Continue medical management  d/w patient, confirmed with her son -Pt is DNR   Code Status: DNR Family Communication: d/w patient, her son  (indicate person spoken with, relationship, and if by phone, the number) Disposition Plan: will benefit from placement, but Pt is refusing    Consultants:  none  Procedures:  echop pend   Antibiotics:  inzanz  (indicate start date, and stop date if  known)  HPI/Subjective: alert  Objective: Filed Vitals:   02/05/14 1000  BP: 126/51  Pulse: 70  Temp:   Resp: 18   No intake or output data in the 24 hours ending 02/05/14 1117 Filed Weights   02/04/14 1900  Weight: 48.308 kg (106 lb 8 oz)    Exam:   General:  alert  Cardiovascular: s1,s2 rrr  Respiratory: CTA BL  Abdomen: soft, nt,nd   Musculoskeletal: no LE edema   Data Reviewed: Basic Metabolic Panel:  Recent Labs Lab 02/04/14 1238  NA 138  K 4.7  CL 98  CO2 25  GLUCOSE 107*  BUN 35*  CREATININE 1.49*  CALCIUM 9.8   Liver Function Tests:  Recent Labs Lab 02/04/14 1238  AST 19  ALT 20  ALKPHOS 113  BILITOT 0.4  PROT 7.3  ALBUMIN 3.6    Recent Labs Lab 02/04/14 1238  LIPASE 32    Recent Labs Lab 02/04/14 1238  AMMONIA 16   CBC:  Recent Labs Lab 02/04/14 1238  WBC 7.9  NEUTROABS 5.5  HGB 11.3*  HCT 35.3*  MCV 92.4  PLT 223   Cardiac Enzymes:  Recent Labs Lab 02/04/14 1238  TROPONINI <0.30   BNP (last 3 results)  Recent Labs  01/26/14 1030  PROBNP 9027.0*   CBG:  Recent Labs Lab 01/29/14 1122 02/04/14 1307 02/05/14 0008 02/05/14 0754  GLUCAP 156* 103* 104* 104*    Recent Results (from the past 240 hour(s))  CULTURE, BLOOD (ROUTINE X 2)     Status: None   Collection Time    01/27/14  3:30 PM      Result Value Ref Range  Status   Specimen Description BLOOD RIGHT FOREARM   Final   Special Requests BOTTLES DRAWN AEROBIC ONLY 3CC   Final   Culture  Setup Time     Final   Value: 01/27/2014 22:48     Performed at [facility]   Culture     Final   Value: NO GROWTH 5 DAYS     Performed at [facility]   Report Status 02/02/2014 FINAL   Final  CULTURE, BLOOD (ROUTINE X 2)     Status: None   Collection Time    01/27/14  3:40 PM      Result Value Ref Range Status   Specimen Description BLOOD RIGHT HAND   Final   Special Requests BOTTLES DRAWN AEROBIC ONLY 2CC   Final   Culture  Setup  Time     Final   Value: 01/27/2014 22:48     Performed at [facility]   Culture     Final   Value: NO GROWTH 5 DAYS     Performed at [facility]   Report Status 02/02/2014 FINAL   Final     Studies: Ct Head Wo Contrast  02/04/2014   CLINICAL DATA:  Right leg weakness.  EXAM: CT HEAD WITHOUT CONTRAST  TECHNIQUE: Contiguous axial images were obtained from the base of the skull through the vertex without intravenous contrast.  COMPARISON:  CT scan of August 04, 2012.  FINDINGS: Bony calvarium appears intact. Mild diffuse cortical atrophy is noted. Mild chronic ischemic white matter disease is noted. No mass effect or midline shift is noted. Ventricular size is within normal limits. There is no evidence of mass lesion, hemorrhage or acute infarction.  IMPRESSION: Mild diffuse cortical atrophy. Mild chronic ischemic white matter disease. No acute intracranial abnormality seen.   Electronically Signed   By: Roque Lias M.D.   On: 02/04/2014 14:05    Scheduled Meds: . ALPRAZolam  0.25 mg Oral BID  . amiodarone  100 mg Oral Daily  . aspirin EC  81 mg Oral Daily  . atorvastatin  10 mg Oral QHS  . carvedilol  12.5 mg Oral BID WC  . cyanocobalamin  1,000 mcg Intramuscular Q30 days  . docusate sodium  200 mg Oral BID  . ertapenem  1 g Intravenous Q24H  . HYDROcodone-acetaminophen  1 tablet Oral Q6H  . isosorbide mononitrate  60 mg Oral Daily  . lactose free nutrition  237 mL Oral TID BM  . levothyroxine  88 mcg Oral QAC breakfast  . pantoprazole  40 mg Oral Daily  . potassium chloride  10 mEq Oral Daily  . tamsulosin  0.4 mg Oral Daily   Continuous Infusions:   Principal Problem:   Weakness Active Problems:   Tachycardia-bradycardia syndrome   Chronic systolic CHF (congestive heart failure)   CKD (chronic kidney disease), stage III   Anemia   Generalized weakness    Time spent: >35 minutes     Esperanza Sheets  [facility] Pager [phone]. If 7PM-7AM,  please contact night-coverage at [email or web address], password [facility] 02/05/2014, 11:17 AM  LOS: 1 day

## 2014-02-05 NOTE — Progress Notes (Addendum)
INITIAL NUTRITION ASSESSMENT  Patient meets the criteria for severe MALNUTRITION in the context of acute illness with 9% weight loss in 2 weeks, PO intake <75% of estimated needs, and moderate muscle and subcutaneous fat wasting   DOCUMENTATION CODES Per approved criteria  -Severe malnutrition in the context of acute illness or injury   INTERVENTION: -Continue Boost Plus TID, each provides 360 kcal, 14 g protein -Magic Cups TID on meal trays. -Patient and family educated on menu choices and encouraged to order foods per preference. She was also encouraged to drink as much of the Boost as she can, as this will likely contribute a majority of caloric needs.   NUTRITION DIAGNOSIS: Inadequate oral intake related to decreased appetite as evidenced by <25% meal intake.   Goal: Patient will meet >/=90% of estimated nutrition needs  Monitor:  PO intake, weight, labs, I/Os  Reason for Assessment: Malnutrition screening tool  80 y.o. female  Admitting Dx: Weakness  ASSESSMENT: 80 year old female with history of renal artery stenosis, coronary artery disease with LAD stent on 99 CABG in 2004, combined heart failure, atrial fibrillation on amiodarone on aspirin status post [facility] in 2013 and chronic kidney disease that woke up this morning with generalized weakness. She has a recent admission for ESBL bacteremia 01/24/14.    Patient with some aphasia, speech slurring, and facial asymmetry, but CT scan was normal.   Family present in the room. They report that since last admission, appetite was fair, but improved 3 days prior to admission. However, she is now eating little. Family reports that she typically eats only a few types of foods, but she does drink Boost shakes BID. This is currently ordered TID. She has lost 10 pounds (9% of her usual weight) in about 2 weeks. She does have evidence of moderate muscle and subcutaneous fat wasting in the upper body.   Nutrition Focused Physical  Exam:  Subcutaneous Fat:  Orbital Region: Not assessed Upper Arm Region: moderate wasting Thoracic and Lumbar Region: moderate wasting  Muscle:  Temple Region: Moderate wasting Clavicle Bone Region: moderate wasting Clavicle and Acromion Bone Region: moderate wasting Scapular Bone Region: not assessed Dorsal Hand: moderate wasting Patellar Region: Not assessed Anterior Thigh Region: not assessed Posterior Calf Region: not assessed  Edema: not present  Height: Ht Readings from Last 1 Encounters:  02/04/14 5' 4.17" (1.63 m)    Weight: Wt Readings from Last 1 Encounters:  02/04/14 106 lb 8 oz (48.308 kg)    Ideal Body Weight: 120 pounds  % Ideal Body Weight: 88%  Wt Readings from Last 10 Encounters:  02/04/14 106 lb 8 oz (48.308 kg)  01/28/14 106 lb 8 oz (48.308 kg)  02/04/13 116 lb 6.4 oz (52.799 kg)  02/04/13 116 lb 6.4 oz (52.799 kg)  01/11/13 121 lb 11.1 oz (55.2 kg)  11/27/12 108 lb 4.8 oz (49.125 kg)  09/05/12 110 lb 4 oz (50.01 kg)  08/29/12 114 lb 4.8 oz (51.846 kg)  07/09/12 112 lb (50.803 kg)  06/16/12 113 lb 12.8 oz (51.619 kg)    Usual Body Weight: 116 pounds  % Usual Body Weight: 91%  BMI:  Body mass index is 18.18 kg/(m^2). Patient is underweight.   Estimated Nutritional Needs: Kcal: 1400 - 1600  Protein: 50 - 60 g  Fluid: >1.5 L/day   Skin: intact  Diet Order: Cardiac  EDUCATION NEEDS: -No education needs identified at this time  No intake or output data in the 24 hours ending 02/05/14  1508  Last BM: PTA   Labs:   Recent Labs Lab 02/04/14 1238  NA 138  K 4.7  CL 98  CO2 25  BUN 35*  CREATININE 1.49*  CALCIUM 9.8  GLUCOSE 107*    CBG (last 3)   Recent Labs  02/05/14 0008 02/05/14 0754 02/05/14 1150  GLUCAP 104* 104* 175*    Scheduled Meds: . amiodarone  100 mg Oral Daily  . aspirin EC  81 mg Oral Daily  . atorvastatin  10 mg Oral QHS  . carvedilol  12.5 mg Oral BID WC  . cyanocobalamin  1,000 mcg  Intramuscular Q30 days  . docusate sodium  200 mg Oral BID  . ertapenem  1 g Intravenous Q24H  . HYDROcodone-acetaminophen  1 tablet Oral Q6H  . isosorbide mononitrate  60 mg Oral Daily  . lactose free nutrition  237 mL Oral TID BM  . levothyroxine  88 mcg Oral QAC breakfast  . pantoprazole  40 mg Oral Daily  . potassium chloride  10 mEq Oral Daily  . tamsulosin  0.4 mg Oral Daily    Continuous Infusions:   Past Medical History  Diagnosis Date  . Diabetes mellitus without complication   . Chest pain   . DJD (degenerative joint disease)   . Tachycardia-bradycardia syndrome   . CKD (chronic kidney disease)   . Cardiomegaly   . Coronary artery disease     a. LAD stent 1999 after NSTEMI. b. CABG 2004. c. Last LHC 02/2010 - patent grafts.  . Hypertension   . Hyperlipidemia   . Carotid artery disease     status post right carotid endarterectomy.  . Degenerative joint disease   . Restless leg syndrome   . Chronic renal insufficiency   . Heparin induced thrombocytopenia   . Diphtheria     "as a child"  . Pleural effusion, bilateral 06/2003  . Pseudoaneurysm     status post repair following catherization in 1999  . Chronic systolic heart failure 10/02/11  . Ischemic cardiomyopathy     Echocardiogram 10/01/11: Mild LVH, EF 35-40%, basal inferior, basal to mid posterior and basal to mid anterolateral severe hypokinesis, mild to moderate AI, moderate MR (ischemic MR), moderate LAE, mild RVE, mildly reduced RV systolic function, mild RAE, PASP 43-44  . COPD (chronic obstructive pulmonary disease)   . Atrial fibrillation/flutter     s/p multiple DCCVs;  amiodarone started 09/2011, TEE DCCV 3/13;  amio and coumadin d/c'd [facility] with frequent falls/syncope  . Tachycardia-bradycardia syndrome     s/p Medtronic pacemaker implant 09/2011 (8 sec pause noted)  . GERD (gastroesophageal reflux disease)   . Renal artery stenosis     bilateral- status post stenting  .  Non-functioning kidney   . Depression     "cries alot"  . Gout   . Hypothyroidism   . Anemia   . Pacemaker     Past Surgical History  Procedure Laterality Date  . Pacemaker insertion    . Carotid endarterectomy  05/2003    right  . Pacemaker insertion  10/02/11    implanted by Dr Johney FrameAllred  . Cholecystectomy  ?02/2011  . Tonsillectomy and adenoidectomy    . Renal artery stent  06/2003    bilaterally/e-chart  . Thoracentesis  ? 2000; 05/2011  . Coronary angioplasty with stent placement  1999  . Av fistula repair  01/1998    w/pseudoaneurysm repair S/P catheterization/E-chart  . Coronary artery bypass graft  05/2003  CABG X 3  . Tee without cardioversion  10/23/2011    Procedure: TRANSESOPHAGEAL ECHOCARDIOGRAM (TEE);  Surgeon: Wendall Stade, MD;  Location: [facility] ENDOSCOPY;  Service: Cardiovascular;  Laterality: N/A;  . Cardioversion  10/23/2011    Procedure: CARDIOVERSION;  Surgeon: Wendall Stade, MD;  Location: [facility] ENDOSCOPY;  Service: Cardiovascular;  Laterality: N/A;    Linnell Fulling, RD, LDN Pager #: [phone] After-Hours Pager #: [phone]

## 2014-02-05 NOTE — Progress Notes (Signed)
[facility]  Patient Status: Active (receiving services up to time of hospitalization)  [facility] is providing the following services: RN, PT, OT, HHA and Home Infusion Services (teaching and education will be done by nurse in the home with patient and caregiver)  If patient discharges after hours, please call [phone].   [name] [name] 02/05/2014, 11:29 AM

## 2014-02-05 NOTE — Care Management Note (Addendum)
    Page 1 of 2   02/08/2014     12:09:53 PM CARE MANAGEMENT NOTE 02/08/2014  Patient:  [name] [name], [name] [name]   Account Number:  [id number]  Date Initiated:  02/05/2014  Documentation initiated by:  GRAVES-BIGELOW,BRENDA  Subjective/Objective Assessment:   Pt admitted for Generalized  Weakness likely deconditioning due to worsening chronic medical illness, recent sepsis, bacteremia.     Action/Plan:   Pt is active with [facility] for RN,PT, OT. HHRN for IV infusion. Pt will need resumption orders if at d/c stable to return home.   Anticipated DC Date:  02/07/2014   Anticipated DC Plan:  HOME W HOME HEALTH SERVICES      DC Planning Services  CM consult      [facility] Choice  Resumption Of Svcs/PTA Provider   Choice offered to / List presented to:          [facility] arranged  HH-1 RN  HH-10 DISEASE MANAGEMENT  HH-2 PT      HH agency  [facility]   Status of service:  Completed, signed off Medicare Important Message given?  YES (If response is "NO", the following Medicare IM given date fields will be blank) Date Medicare IM given:  02/06/2014 Date Additional Medicare IM given:    Discharge Disposition:  SKILLED NURSING FACILITY  Per UR Regulation:  Reviewed for med. necessity/level of care/duration of stay  If discussed at Long Length of Stay Meetings, dates discussed:   02/09/2014    Comments:  02-08-14 1207 Tomi Bamberger, RN,BSN [phone] Plan for d/c to SNF today.  02-06-14 [address] [place] [phone] CM did receive call from CSW Crytal and she states pt is agreeable to SNF at this time. CM will make [facility] aware that pt will d/c to SNF today. NO further needs from CM at this time.  02-06-14 [address] [place] [phone] CM did call [facility] for resumption orders for services listed above. SOC ot begin within 24-48 hours post d/c. No further needs from CM at this time.

## 2014-02-05 NOTE — Progress Notes (Signed)
Iv team is asking if pt still need the PICC line  Since she has only a few days left for her  medicine therapy. She has a peripheral access at present.

## 2014-02-05 NOTE — Progress Notes (Signed)
Subjective: Mild SOB this morning.  No N,V, F.  Still weak.  Objective: Vital signs in last 24 hours: Temp:  [97.9 F (36.6 C)-98.9 F (37.2 C)] 98.1 F (36.7 C) (06/26 0400) Pulse Rate:  [41-99] 70 (06/26 0600) Resp:  [16-77] 16 (06/26 0600) BP: (107-155)/(50-107) 138/65 mmHg (06/26 0600) SpO2:  [92 %-99 %] 94 % (06/26 0600) Weight:  [106 lb 8 oz (48.308 kg)] 106 lb 8 oz (48.308 kg) (06/25 1900)    Intake/Output from previous day:   Intake/Output this shift:    Medications Current Facility-Administered Medications  Medication Dose Route Frequency Provider Last Rate Last Dose  . acetaminophen (TYLENOL) tablet 650 mg  650 mg Oral Q4H PRN Marinda ElkAbraham Feliz Ortiz, MD      . ALPRAZolam Prudy Feeler(XANAX) tablet 0.25 mg  0.25 mg Oral BID Marinda ElkAbraham Feliz Ortiz, MD   0.25 mg at 02/04/14 2048  . amiodarone (PACERONE) tablet 100 mg  100 mg Oral Daily Marinda ElkAbraham Feliz Ortiz, MD   100 mg at 02/04/14 2045  . aspirin EC tablet 81 mg  81 mg Oral Daily Marinda ElkAbraham Feliz Ortiz, MD   81 mg at 02/04/14 2045  . atorvastatin (LIPITOR) tablet 10 mg  10 mg Oral QHS Marinda ElkAbraham Feliz Ortiz, MD   10 mg at 02/04/14 2047  . carvedilol (COREG) tablet 12.5 mg  12.5 mg Oral BID WC Marinda ElkAbraham Feliz Ortiz, MD   12.5 mg at 02/04/14 2045  . cyanocobalamin ((VITAMIN B-12)) injection 1,000 mcg  1,000 mcg Intramuscular Q30 days Marinda ElkAbraham Feliz Ortiz, MD   1,000 mcg at 02/04/14 2345  . docusate sodium (COLACE) capsule 200 mg  200 mg Oral BID Marinda ElkAbraham Feliz Ortiz, MD   200 mg at 02/04/14 2047  . ertapenem (INVANZ) 1 g in sodium chloride 0.9 % 50 mL IVPB  1 g Intravenous Q24H Marinda ElkAbraham Feliz Ortiz, MD   1 g at 02/04/14 2345  . furosemide (LASIX) tablet 40 mg  40 mg Oral Daily Marinda ElkAbraham Feliz Ortiz, MD   40 mg at 02/04/14 2046  . HYDROcodone-acetaminophen (NORCO) 10-325 MG per tablet 1 tablet  1 tablet Oral Q6H Marinda ElkAbraham Feliz Ortiz, MD   1 tablet at 02/05/14 0540  . isosorbide mononitrate (IMDUR) 24 hr tablet 60 mg  60 mg Oral Daily Marinda ElkAbraham Feliz Ortiz,  MD   60 mg at 02/04/14 2046  . lactose free nutrition (Boost) liquid 237 mL  237 mL Oral TID BM Marinda ElkAbraham Feliz Ortiz, MD      . levothyroxine (SYNTHROID, LEVOTHROID) tablet 88 mcg  88 mcg Oral QAC breakfast Marinda ElkAbraham Feliz Ortiz, MD      . nitroGLYCERIN (NITROSTAT) SL tablet 0.4 mg  0.4 mg Sublingual Q5 min PRN Marinda ElkAbraham Feliz Ortiz, MD      . pantoprazole (PROTONIX) EC tablet 40 mg  40 mg Oral Daily Marinda ElkAbraham Feliz Ortiz, MD   40 mg at 02/04/14 2048  . potassium chloride (K-DUR,KLOR-CON) CR tablet 10 mEq  10 mEq Oral Daily Marinda ElkAbraham Feliz Ortiz, MD   10 mEq at 02/04/14 2046  . sodium chloride 0.9 % injection 10-40 mL  10-40 mL Intracatheter PRN Marinda ElkAbraham Feliz Ortiz, MD   10 mL at 02/04/14 2011  . sodium chloride 0.9 % injection 10-40 mL  10-40 mL Intracatheter PRN Marinda ElkAbraham Feliz Ortiz, MD      . tamsulosin [facility](FLOMAX) capsule 0.4 mg  0.4 mg Oral Daily Marinda ElkAbraham Feliz Ortiz, MD   0.4 mg at 02/04/14 2046    PE: General appearance: alert, cooperative, no distress and  Appears to be resting comfortably Neck: no JVD Lungs: CTA, no wheeze, rales, rhonchi Heart: regular rate and rhythm Abdomen: +BS, Non tender Extremities: No LEE Pulses: 2+ radials.  0 DP/PT however, feet are warm Skin: Warm and dry Neurologic: Grossly normal  Lab Results:   Recent Labs  02/04/14 1238  WBC 7.9  HGB 11.3*  HCT 35.3*  PLT 223   BMET  Recent Labs  02/04/14 1238  NA 138  K 4.7  CL 98  CO2 25  GLUCOSE 107*  BUN 35*  CREATININE 1.49*  CALCIUM 9.8   PT/INR No results found for this basename: LABPROT, INR,  in the last 72 hours Cholesterol  Recent Labs  02/05/14 0530  CHOL 113   Lipid Panel     Component Value Date/Time   CHOL 113 02/05/2014 0530   TRIG 119 02/05/2014 0530   HDL 30* 02/05/2014 0530   CHOLHDL 3.8 02/05/2014 0530   VLDL 24 02/05/2014 0530   LDLCALC 59 02/05/2014 0530       Assessment/Plan 80 year old Caucasian female with past medical history significant for coronary artery disease  status post LAD stent and CABG in 2004, combined systolic and diastolic heart failure, atrial fibrillation status post DC cardioversion 2003, chronic kidney disease, hypertension, and hyperlipidemia, history of cardiomyopathy with baseline EF 30-35% presented with generalized weakness and recurrent a-fib   1. Generalized weakness  - negative CT of head, likely due to multiple comorbidities including deconditioning, recent bacteremia and a-fib.   - further workup per IM -Echo pending. 2. A-fib  - Rate well controlled.  V pacing in the 70's.  On Amio 100mg  daily.  Currently continue rate control with coreg 12.5bid  - Not a candidate for systemic anti-coagulation due to multiple comorbidities and her frequent fall   -Continue to monitor 3. recent bacteremia - on Invanz with PICC line(removed).  WBCs WNL 4. S/p pacemaker- Vpaing on tele 70's 5. combined systolic and diastolic heart failure  - no active sign of acute exacerbation of CHF  - Hold Lasix 40mg  today.  - Echo/15/2014, EF 30-35%, grade 2 diastolic dysfunction  6. CKD:  SCr/BUN have increased compared to 01/24/14(1.29)  7. HTN :  BP stable.  8. Hyperlipidemia: on lipitor 9. CAD s/p stent and CABG 2004  - No complaints of angina.  Continue medical management     LOS: 1 day    HAGER, BRYAN PA-C 02/05/2014 8:19 AM  As above, patient seen and examined. She denies chest pain or dyspnea but continues to describe weakness. Etiology unclear but most likely multifactorial including deconditioning, recent bacteremia, and age with possible contribution from atrial fibrillation. Continue present medications for rate control. Continue aspirin. She has been felt not to be a candidate for anticoagulations previously because of frail body habitus and falls. We therefore cannot consider cardioversion. Will hold Lasix and resume at lower doses later as her renal function is somewhat worse. Question contribution from dehydration.  Olga Millers

## 2014-02-06 DIAGNOSIS — I509 Heart failure, unspecified: Secondary | ICD-10-CM

## 2014-02-06 DIAGNOSIS — I428 Other cardiomyopathies: Secondary | ICD-10-CM

## 2014-02-06 DIAGNOSIS — R5383 Other fatigue: Secondary | ICD-10-CM

## 2014-02-06 DIAGNOSIS — R5381 Other malaise: Secondary | ICD-10-CM

## 2014-02-06 LAB — GLUCOSE, CAPILLARY
GLUCOSE-CAPILLARY: 91 mg/dL (ref 70–99)
GLUCOSE-CAPILLARY: 100 mg/dL — AB (ref 70–99)
GLUCOSE-CAPILLARY: 102 mg/dL — AB (ref 70–99)
Glucose-Capillary: 139 mg/dL — ABNORMAL HIGH (ref 70–99)

## 2014-02-06 MED ORDER — FUROSEMIDE 40 MG PO TABS: 20.0000 mg | ORAL_TABLET | Freq: Every day | ORAL | Status: DC

## 2014-02-06 MED ORDER — CARVEDILOL 3.125 MG PO TABS
3.1250 mg | ORAL_TABLET | Freq: Two times a day (BID) | ORAL | Status: DC
  Administered 2014-02-06 – 2014-02-07 (×2): 3.125 mg via ORAL
  Filled 2014-02-06 (×4): qty 1

## 2014-02-06 MED ORDER — ISOSORBIDE MONONITRATE 15 MG HALF TABLET: 15.0000 mg | ORAL_TABLET | Freq: Every day | ORAL | Status: DC

## 2014-02-06 MED ORDER — ISOSORBIDE MONONITRATE 15 MG HALF TABLET
15.0000 mg | ORAL_TABLET | Freq: Every day | ORAL | Status: DC
  Administered 2014-02-07: 15 mg via ORAL
  Filled 2014-02-06 (×2): qty 1

## 2014-02-06 MED ORDER — SODIUM CHLORIDE 0.9 % IV SOLN
500.0000 mg | INTRAVENOUS | Status: DC
  Administered 2014-02-06 – 2014-02-07 (×2): 0.5 g via INTRAVENOUS
  Filled 2014-02-06 (×4): qty 0.5

## 2014-02-06 MED ORDER — CARVEDILOL 3.125 MG PO TABS: 3.1250 mg | ORAL_TABLET | Freq: Two times a day (BID) | ORAL | Status: DC

## 2014-02-06 NOTE — Progress Notes (Signed)
Occupational Therapy Evaluation Patient Details Name: [name] [name] MRN: [id number] DOB: [birth year] Today's Date: 02/06/2014    History of Present Illness [name] [name] is an 80 y.o. Female admitted 01/25/14 for generalized weakness. Past medical history of ESBL bacteremia, renal artery stenosis, CAD with LAD stent,  CABG in 2004, combined heart failure, atrial fibrillation, and chronic kidney disease. Of note, pt admitted 01/24/14 for c/o lethargy and weakness. Pt's son reports that pt was at a SNF for ~14 months and he signed her out and took her home for a few days [facility]    Clinical Impression   PTA pt needed assistance for ADLs and functional mobility, although it is difficult to determine full PLOF due to pt and family inconsistencies. Pt currently at Min (A) for functional mobility, however strongly feel that she requires 24/7 assistance due to PMH and history of d/c home then returning to hospital. Communicated in depth with pt and pt's son and daughters to express full level of care required by pt and seriousness of safety concerns. Pt and son now open to considering SNF for d/c despite prior plan of home with HHPT/OT. Fully support pt d/c to SNF.     Follow Up Recommendations  SNF;Supervision/Assistance - 24 hour    Equipment Recommendations  None recommended by OT       Precautions / Restrictions Precautions Precautions: Fall Restrictions Weight Bearing Restrictions: No      Mobility Bed Mobility Overal bed mobility: Needs Assistance Bed Mobility: Supine to Sit;Sit to Supine     Supine to sit: Supervision (use of bed rail) Sit to supine: Supervision (flat bed, encouraged no use of bed rail)   General bed mobility comments: pt with reliance on bed rail and reminded pt if she were to return home, she would not have bed rail to assist. encouraged pt to return to bed without use of bedrail.   Transfers Overall transfer level: Needs  assistance Equipment used: Rolling walker (2 wheeled) Transfers: Sit to/from [employer] CorporationStand;Stand Pivot Transfers Sit to Stand: Min assist Stand pivot transfers: Min assist       General transfer comment: VC's for hand placement and safety. Increased time to complete. Pt able to use Bil UEs on RW despite LUE ROM limitations, however encouraged pt to push to stand with RUE.          ADL Overall ADL's : Needs assistance/impaired Eating/Feeding: Set up;Sitting Eating/Feeding Details (indicate cue type and reason): pt requires assist to open containers due to LUE impairments Grooming: Set up;Sitting   Upper Body Bathing: Moderate assistance;Sitting   Lower Body Bathing: Moderate assistance;Sit to/from stand   Upper Body Dressing : Maximal assistance;Sitting   Lower Body Dressing: Maximal assistance;Sit to/from stand   Toilet Transfer: Stand-pivot;Minimal assistance;BSC;RW   Toileting- Clothing Manipulation and Hygiene: Maximal assistance;Sit to/from stand   Tub/ Shower Transfer: Walk-in shower;Minimal assistance;Ambulation;Rolling walker;Shower seat   Functional mobility during ADLs: Minimal assistance;Rolling walker General ADL Comments: Pt has unrealistic idea of her ability to be independent. Spoke in depth with pt and son regarding assist level required and need for 24/7 Supervision due to impairments. Strongly feel that pt would benefit from SNF for full supervision. Pt ambulated to hallway and [facility] Therapist spoke to pt's son (in room) and pt's daughters (on phone) to relay full extent of pt's limitations and assist level needed. OT encouraged pt and family to communicate openly regarding safest option for pt d/c.  Vision  Pt reports no change from baseline.  No apparent visual deficits.                    Perception Perception Perception Tested?: No   Praxis Praxis Praxis tested?: Within functional limits    Pertinent  Vitals/Pain NAD     Hand Dominance Right   Extremity/Trunk Assessment Upper Extremity Assessment Upper Extremity Assessment: LUE deficits/detail;Generalized weakness LUE Deficits / Details: history of rotator cuff tear with limited ROM at baseline (~1/4 range) LUE:  (MMT contraindicated. No edema, no decreased sensation.) LUE Coordination: decreased gross motor;decreased fine motor   Lower Extremity Assessment Lower Extremity Assessment: Generalized weakness   Cervical / Trunk Assessment Cervical / Trunk Assessment: Kyphotic   Communication Communication Communication: No difficulties   Cognition Arousal/Alertness: Awake/alert Behavior During Therapy: WFL for tasks assessed/performed Overall Cognitive Status: Impaired/Different from baseline Area of Impairment: Safety/judgement         Safety/Judgement: Decreased awareness of deficits;Decreased awareness of safety     General Comments: Pt's son reports that she is "strong headed." Pt has desire to live independently despite clearly needing assistance. Communicated with pt and son in depth regarding safe d/c planning and realistic expectations for independence.               Home Living Family/patient expects to be discharged to:: Private residence Living Arrangements: Alone Available Help at Discharge: Family;Available PRN/intermittently (family reports they are not available 24/7) Type of Home: House Home Access: Stairs to enter [employer] of Steps: 2 Entrance Stairs-Rails: Right Home Layout: One level     Bathroom Shower/Tub: Producer, television/film/video: Standard     Home Equipment: Environmental consultant - 2 wheels;Cane - single point;Bedside commode          Prior Functioning/Environment Level of Independence: Needs assistance        Comments: Pt reports she is independent, however son reports that she needs assistance. Pt then admitted "I need help getting to the bathroom and that sort of thing."  Difficult to get full report of PLOF. Family with conflicting reports and strong opinions about pt's health and d/c placement.                               End of Session Equipment Utilized During Treatment: Gait belt;Rolling walker Nurse Communication: Other (comment) (pt considering SNF for d/c after full discussion)  Activity Tolerance: Patient tolerated treatment well Patient left: in bed;with call bell/phone within reach;with bed alarm set;with family/visitor present   Time: 2575-0518 OT Time Calculation (min): 61 min Charges:  OT General Charges $OT Visit: 1 Procedure OT Evaluation $Initial OT Evaluation Tier I: 1 Procedure OT Treatments $Self Care/Home Management : 38-52 mins  Rae Lips [phone] 02/06/2014, 11:31 AM

## 2014-02-06 NOTE — Clinical Social Work Note (Addendum)
CSW made aware by RN patient ready for d/c and may decide on SNF placement. CSW met with patient and son, who is agreeable to SNF placement. Per patient's son, he would prefer for patient to return to [facility]. Patient is in agreement. CSW made RNCM, Hassan Rowan aware. CSW updated FL[facility] and [facility]ed out [facility] CSW made patient and son aware and RN, Rey. CSW to follow-up with bed offers.   [place], [place] Weekend Clinical Social Worker [phone]

## 2014-02-06 NOTE — Discharge Instructions (Signed)
Please follow up with primary care doctor in 1 week  °

## 2014-02-06 NOTE — Clinical Social Work Note (Signed)
CSW continues to follow this patient for d/c planning needs. CSW made RN Rey, aware of 3 night qualifying stay per Medicare guidelines for SNF placement. CSW to follow-up with patient tomorrow for d/c.   Crystal Patrick-Jefferson, LCSWA Weekend Clinical Social Worker [phone]

## 2014-02-06 NOTE — Progress Notes (Signed)
[facility] PROGRESS NOTE  [name] [name] XWR:[id number] DOB: 12-20-1924 DOA: 02/04/2014 PCP: Josue Hector, MD  Assessment/Plan: 80 y.o. female with DM, CAD s/p CABG, HTN, HLD, CHF, PAF not on AC (risk of fall, bleeding), s/p PPM, dementia, recently admitted for sepsis, bacteremia on IV atx (lasty day 02/10/14) presented with generalized weakness, facial asymmetry some aphasia   Hospital Course:  1. Generalized Weakness likely deconditioning due to worsening chronic medical illness, recent sepsis, bacteremia  -cont supportive care, PT eval; Pt would benefit from SNF, but refusing, family would like to talk to her again  2. ? Facial asymmetry, aphasia on presentation, not present on exam; neuro exam no focal;  -Pt denies aphasia, CT: Mild diffuse cortical atrophy. Mild chronic ischemic white matter disease. No acute intracranial abnormality seen  -? Benzo related; changed xanax to prn; underlying worsening, deconditioning; cont ASA, statin, PT;  3. A-fib, s/p PPM; cont Amio 100mg  daily. coreg 12.5bid  - Not a candidate for systemic anti-coagulation due to multiple comorbidities and her frequent fall  4. Recent bacteremia - on Invanz; afebrile; WBCs WNL; cont atx last day 02/10/14  -d/w picc line team, recommended to cont IV access as peripheral to complete few more days of IV atx  5. Combined systolic and diastolic heart failure; no active sign of acute exacerbation of CHF  - resume Lasix as needed low dose; Echo/15/2014, EF 30-35%, grade 2 diastolic dysfunction  -deceased NTG, BB,lasix due to low BP; Pt is euvolemic  6. CKD: SCr/BUN have increased compared to 01/24/14(1.29)  7. CAD s/p stent and CABG 2004  - No complaints of angina. Continue medical management  d/w patient, confirmed with her son  -Pt is DNR  -Family is planning to discuss more with patient about placement   Pt not is agreed for SNF, SW involved    Code Status: DNR Family Communication: d/w patient, her son   (indicate person spoken with, relationship, and if by phone, the number) Disposition Plan: will benefit from placement, but Pt is refusing    Consultants:  none  Procedures:  echop pend   Antibiotics:  inzanz  (indicate start date, and stop date if known)  HPI/Subjective: alert  Objective: Filed Vitals:   02/06/14 0826  BP: 115/47  Pulse: 69  Temp:   Resp:     Intake/Output Summary (Last 24 hours) at 02/06/14 1228 Last data filed at 02/05/14 2000  Gross per 24 hour  Intake    480 ml  Output    301 ml  Net    179 ml   Filed Weights   02/04/14 1900 02/06/14 0400  Weight: 48.308 kg (106 lb 8 oz) 51.256 kg (113 lb)    Exam:   General:  alert  Cardiovascular: s1,s2 rrr  Respiratory: CTA BL  Abdomen: soft, nt,nd   Musculoskeletal: no LE edema   Data Reviewed: Basic Metabolic Panel:  Recent Labs Lab 02/04/14 1238  NA 138  K 4.7  CL 98  CO2 25  GLUCOSE 107*  BUN 35*  CREATININE 1.49*  CALCIUM 9.8   Liver Function Tests:  Recent Labs Lab 02/04/14 1238  AST 19  ALT 20  ALKPHOS 113  BILITOT 0.4  PROT 7.3  ALBUMIN 3.6    Recent Labs Lab 02/04/14 1238  LIPASE 32    Recent Labs Lab 02/04/14 1238  AMMONIA 16   CBC:  Recent Labs Lab 02/04/14 1238  WBC 7.9  NEUTROABS 5.5  HGB 11.3*  HCT 35.3*  MCV 92.4  PLT 223   Cardiac Enzymes:  Recent Labs Lab 02/04/14 1238  TROPONINI <0.30   BNP (last 3 results)  Recent Labs  01/26/14 1030  PROBNP 9027.0*   CBG:  Recent Labs Lab 02/05/14 1150 02/05/14 1646 02/05/14 2010 02/06/14 0745 02/06/14 1149  GLUCAP 175* 148* 128* 91 139*    Recent Results (from the past 240 hour(s))  CULTURE, BLOOD (ROUTINE X 2)     Status: None   Collection Time    01/27/14  3:30 PM      Result Value Ref Range Status   Specimen Description BLOOD RIGHT FOREARM   Final   Special Requests BOTTLES DRAWN AEROBIC ONLY 3CC   Final   Culture  Setup Time     Final   Value: 01/27/2014 22:48      [facility] at [facility]   Culture     Final   Value: NO GROWTH 5 DAYS     Performed at [facility]   Report Status 02/02/2014 FINAL   Final  CULTURE, BLOOD (ROUTINE X 2)     Status: None   Collection Time    01/27/14  3:40 PM      Result Value Ref Range Status   Specimen Description BLOOD RIGHT HAND   Final   Special Requests BOTTLES DRAWN AEROBIC ONLY 2CC   Final   Culture  Setup Time     Final   Value: 01/27/2014 22:48     Performed at [facility]   Culture     Final   Value: NO GROWTH 5 DAYS     Performed at [facility]   Report Status 02/02/2014 FINAL   Final     Studies: Ct Head Wo Contrast  02/04/2014   CLINICAL DATA:  Right leg weakness.  EXAM: CT HEAD WITHOUT CONTRAST  TECHNIQUE: Contiguous axial images were obtained from the base of the skull through the vertex without intravenous contrast.  COMPARISON:  CT scan of August 04, 2012.  FINDINGS: Bony calvarium appears intact. Mild diffuse cortical atrophy is noted. Mild chronic ischemic white matter disease is noted. No mass effect or midline shift is noted. Ventricular size is within normal limits. There is no evidence of mass lesion, hemorrhage or acute infarction.  IMPRESSION: Mild diffuse cortical atrophy. Mild chronic ischemic white matter disease. No acute intracranial abnormality seen.   Electronically Signed   By: Roque LiasJames  Green M.D.   On: 02/04/2014 14:05    Scheduled Meds: . amiodarone  100 mg Oral Daily  . aspirin EC  81 mg Oral Daily  . atorvastatin  10 mg Oral QHS  . carvedilol  12.5 mg Oral BID WC  . cyanocobalamin  1,000 mcg Intramuscular Q30 days  . docusate sodium  200 mg Oral BID  . ertapenem  1 g Intravenous Q24H  . HYDROcodone-acetaminophen  1 tablet Oral Q6H  . isosorbide mononitrate  60 mg Oral Daily  . lactose free nutrition  237 mL Oral TID BM  . levothyroxine  88 mcg Oral QAC breakfast  . pantoprazole  40 mg Oral Daily  . potassium chloride  10 mEq Oral Daily   . tamsulosin  0.4 mg Oral Daily   Continuous Infusions:   Principal Problem:   Weakness Active Problems:   Tachycardia-bradycardia syndrome   Chronic systolic CHF (congestive heart failure)   CKD (chronic kidney disease), stage III   Anemia   Generalized weakness    Time spent: >35 minutes  Esperanza Sheets  [facility] Pager [phone]. If 7PM-7AM, please contact night-coverage at [email or web address], password [facility] 02/06/2014, 12:28 PM  LOS: 2 days

## 2014-02-06 NOTE — Discharge Summary (Deleted)
Physician Discharge Summary  [name] [name] ZOX:[id number] DOB: 07-11-1925 DOA: 02/04/2014  PCP: Josue Hector, MD  Admit date: 02/04/2014 Discharge date: 02/06/2014  Time spent: >35 minutes  Recommendations for Outpatient Follow-up:  [facility]HC F/u with PCP in 1 week  Discharge Diagnoses:  Principal Problem:   Weakness Active Problems:   Tachycardia-bradycardia syndrome   Chronic systolic CHF (congestive heart failure)   CKD (chronic kidney disease), stage III   Anemia   Generalized weakness   Discharge Condition: stable   Diet recommendation: low sodium   Filed Weights   02/04/14 1900 02/06/14 0400  Weight: 48.308 kg (106 lb 8 oz) 51.256 kg (113 lb)    History of present illness:  79 y.o. female with DM, CAD s/p CABG, HTN, HLD, CHF, PAF not on AC (risk of fall, bleeding), s/p PPM, dementia, recently admitted for sepsis, bacteremia on IV atx (lasty day 02/10/14) presented with generalized weakness, facial asymmetry some aphasia   Hospital Course:  1. Generalized Weakness likely deconditioning due to worsening chronic medical illness, recent sepsis, bacteremia  -cont supportive care, PT eval; Pt would benefit from SNF, but refusing, family would like to talk to her again  2. ? Facial asymmetry, aphasia on presentation, not present on exam; neuro exam no focal;  -Pt denies aphasia, CT: Mild diffuse cortical atrophy. Mild chronic ischemic white matter disease. No acute intracranial abnormality seen  -? Benzo related; changed xanax to prn; underlying worsening, deconditioning; cont ASA, statin, PT;  3. A-fib, s/p PPM; cont Amio 100mg  daily. coreg 12.5bid  - Not a candidate for systemic anti-coagulation due to multiple comorbidities and her frequent fall  4. Recent bacteremia - on Invanz; afebrile; WBCs WNL; cont atx last day 02/10/14  -d/w picc line team, recommended to cont IV access as peripheral to complete few more days of IV atx  5. Combined systolic and diastolic heart  failure; no active sign of acute exacerbation of CHF  - resume Lasix as needed low dose; Echo/15/2014, EF 30-35%, grade 2 diastolic dysfunction  -deceased NTG, BB,lasix due to low BP; Pt is euvolemic  6. CKD: SCr/BUN have increased compared to 01/24/14(1.29)  7. CAD s/p stent and CABG 2004  - No complaints of angina. Continue medical management    d/w patient, confirmed with her son  -Pt is DNR -Family is planning to discuss more with patient about placement   Procedures:  none (i.e. Studies not automatically included, echos, thoracentesis, etc; not x-rays)  Consultations:  none  Discharge Exam: Filed Vitals:   02/06/14 0826  BP: 115/47  Pulse: 69  Temp:   Resp:     General: alert Cardiovascular: s1,s2 rrr Respiratory: CTA BL  Discharge Instructions  Discharge Instructions   Diet - low sodium heart healthy    Complete by:  As directed      Discharge instructions    Complete by:  As directed   Please follow up with primary care doctor in 1 week     Increase activity slowly    Complete by:  As directed             Medication List         ALPRAZolam 0.25 MG tablet  Commonly known as:  XANAX  Take 0.25 mg by mouth 2 (two) times daily.     amiodarone 200 MG tablet  Commonly known as:  PACERONE  Take 100 mg by mouth daily.     aspirin EC 81 MG tablet  Take 81 mg by mouth  daily.     atorvastatin 10 MG tablet  Commonly known as:  LIPITOR  Take 10 mg by mouth at bedtime.     carvedilol 3.125 MG tablet  Commonly known as:  COREG  Take 1 tablet (3.125 mg total) by mouth 2 (two) times daily with a meal.     Cholecalciferol 1000 UNITS capsule  Take 1,000 Units by mouth daily.     cyanocobalamin 1000 MCG/ML injection  Commonly known as:  (VITAMIN B-12)  Inject 1,000 mcg into the muscle every 30 (thirty) days. Done at Dr. Joyce Copa office     docusate sodium 100 MG capsule  Commonly known as:  COLACE  Take 200 mg by mouth 2 (two) times daily.      furosemide 40 MG tablet  Commonly known as:  LASIX  Take 0.5 tablets (20 mg total) by mouth daily.     HYDROcodone-acetaminophen 10-325 MG per tablet  Commonly known as:  NORCO  Take 1 tablet by mouth every 6 (six) hours. scheduled     INVANZ IV  - Inject 500 mg into the vein See admin instructions. Invanz 500 mg in 100 ml NS (provided by [facility], [place], [place]) Allow to warm to room temperature.  Infuse over 30 minutes via gravity tubing every 24 hours as directed (10 am every morning) Set drip rate at 50 drops per minute (change tubing once every 24 hours)  - Start date June 18th, stop date July 1st     isosorbide mononitrate 15 mg Tb24 24 hr tablet  Commonly known as:  IMDUR  Take 0.5 tablets (15 mg total) by mouth daily.     lactose free nutrition Liqd  Take 237 mLs by mouth 3 (three) times daily between meals.     levothyroxine 88 MCG tablet  Commonly known as:  SYNTHROID, LEVOTHROID  Take 88 mcg by mouth daily before breakfast.     nitroGLYCERIN 0.4 MG SL tablet  Commonly known as:  NITROSTAT  Place 0.4 mg under the tongue every 5 (five) minutes as needed for chest pain.     omeprazole 40 MG capsule  Commonly known as:  PRILOSEC  Take 1 capsule (40 mg total) by mouth daily.     potassium chloride 10 MEQ tablet  Commonly known as:  K-DUR,KLOR-CON  Take 10 mEq by mouth daily.     sodium chloride 0.9 % injection  10-40 mLs by Intracatheter route as needed (flush).     tamsulosin 0.4 MG Caps capsule  Commonly known as:  FLOMAX  Take 1 capsule (0.4 mg total) by mouth daily.       Allergies  Allergen Reactions  . Heparin Other (See Comments)    Clots blood instead of thinning  . Hydromorphone Hcl Other (See Comments)    Crazy feeling  . Warfarin And Related Other (See Comments)    Syncope events  . Altace [Ramipril] Hives  . Contrast Media [Iodinated Diagnostic Agents] Other (See Comments)    Reaction noted by md- might have caused aneurism in 1999   . Morphine And Related Nausea And Vomiting  . Plavix [Clopidogrel Bisulfate] Hives and Itching       Follow-up Information   Follow up with Josue Hector, MD In 1 week.   Specialty:  Family Medicine   Contact information:   [address] [place] [address] [phone]        The results of significant diagnostics from this hospitalization (including imaging, microbiology, ancillary and laboratory) are  listed below for reference.    Significant Diagnostic Studies: Ct Abdomen Pelvis Wo Contrast  01/24/2014   CLINICAL DATA:  Lethargy and weakness.  Epigastric abdominal pain.  EXAM: CT ABDOMEN AND PELVIS WITHOUT CONTRAST  TECHNIQUE: Multidetector CT imaging of the abdomen and pelvis was performed following the standard protocol without IV contrast.  COMPARISON:  Pelvic ultrasound performed 12/02/2012, and CT of the abdomen and pelvis performed 07/24/2010  FINDINGS: The visualized lung bases are clear. The patient is status post median sternotomy. Pacemaker leads are partially imaged. Scattered coronary artery calcifications are seen.  Prominence of the intrahepatic biliary ducts likely remains within normal limits status post cholecystectomy. Clips are noted along the gallbladder fossa. The liver and spleen are otherwise unremarkable in appearance. The pancreas and left adrenal gland are unremarkable. A 1.6 cm rounded density at the right adrenal gland is nonspecific in appearance, but has increased only mildly in size from 2011, and is likely benign.  The kidneys are unremarkable in appearance. There is no evidence of hydronephrosis. Slight prominence of both ureters remains within normal limits. There is no evidence of distal obstruction. A calcification adjacent to the left vesicoureteral junction is stable from 2011 and likely vascular in nature. No renal or ureteral stones are seen. Mild nonspecific perinephric stranding is noted bilaterally. Bilateral extrarenal pelves are seen.   No free fluid is identified. The small bowel is unremarkable in appearance. The stomach is within normal limits. No acute vascular abnormalities are seen.  There is ectasia of the infrarenal abdominal aorta, measuring 2.9 cm in maximal transverse dimension and 2.3 cm in AP dimension. There is no evidence of aneurysmal dilatation. Diffuse calcification is seen along the abdominal aorta and its branches. Stents are noted within both proximal renal arteries, and there is relatively diffuse calcification along the superior mesenteric artery.  The appendix is not definitely seen; there is no evidence for appendicitis. Minimal diverticulosis is noted along the mid sigmoid colon, without evidence of diverticulitis. Dense stool is seen within the rectum.  The bladder is significantly distended and grossly unremarkable in appearance. The uterus is within normal limits, aside from scattered calcification. The ovaries are relatively symmetric. No suspicious adnexal masses are seen. No inguinal lymphadenopathy is seen. Postoperative change is noted at the right inguinal region.  No acute osseous abnormalities are identified. Vacuum phenomenon and disc space narrowing are seen at L4-L5.  IMPRESSION: 1. No definite acute abnormalities seen in the abdomen or pelvis. 2. Diffuse calcification along the abdominal aorta and its branches, with relatively diffuse calcification involving the superior mesenteric artery. Stents within both proximal renal arteries are grossly unremarkable in appearance. Ectasia of the infrarenal abdominal aorta, without evidence of aneurysmal dilatation. 3. Minimal diverticulosis along the mid sigmoid colon, without evidence of diverticulitis. 4. Scattered coronary artery calcifications seen. 5. 1.6 cm rounded density at the right adrenal gland is nonspecific, but is increased only mildly in size from 2011, and is likely benign.   Electronically Signed   By: Roanna Raider M.D.   On: 01/24/2014 22:10   Ct  Head Wo Contrast  02/04/2014   CLINICAL DATA:  Right leg weakness.  EXAM: CT HEAD WITHOUT CONTRAST  TECHNIQUE: Contiguous axial images were obtained from the base of the skull through the vertex without intravenous contrast.  COMPARISON:  CT scan of August 04, 2012.  FINDINGS: Bony calvarium appears intact. Mild diffuse cortical atrophy is noted. Mild chronic ischemic white matter disease is noted. No mass effect or midline shift  is noted. Ventricular size is within normal limits. There is no evidence of mass lesion, hemorrhage or acute infarction.  IMPRESSION: Mild diffuse cortical atrophy. Mild chronic ischemic white matter disease. No acute intracranial abnormality seen.   Electronically Signed   By: Roque Lias M.D.   On: 02/04/2014 14:05   US Abdomen Complete  01/25/2014   CLINICAL DATA:  Upper abdominal pain, elevated liver function tests. Status post cholecystectomy.  EXAM: ULTRASOUND ABDOMEN COMPLETE  COMPARISON:  CT of the abdomen and pelvis January 24, 2014 at 2152 hr  FINDINGS: Gallbladder:  Surgically absent.  Common bile duct:  Diameter: 8 mm, no sonographic findings of choledocholithiasis.  Liver:  Mildly heterogeneous echotexture may reflect fatty infiltration without intrahepatic biliary dilatation. No perihepatic free fluid. Hepatopetal portal vein.  IVC:  No abnormality visualized.  Pancreas:  Visualized portion unremarkable.  Spleen:  Size and appearance within normal limits.  Right Kidney:  Length: 8.5 cm. Echogenicity within normal limits. No mass or hydronephrosis visualized. Mild thinning of the renal cortex. Extra renal pelvis.  Left Kidney:  Length: 10.3 cm. Echogenicity within normal limits. 3.7 x 3.2 x 2.9 cm air anechoic cyst in upper pole left kidney as seen on prior CT. No hydronephrosis visualized. Mild thinning of the renal cortex.  Abdominal aorta: Limited assessment due to bowel gas and presumed calcific atherosclerosis ; ectatic aorta is seen on CT of abdomen and pelvis from  same day, reported separately.  No aneurysm visualized.  Other findings:  None.  IMPRESSION: No acute abdominal process by sonography. Status post cholecystectomy.  Mild symmetric cortical renal atrophy.   Electronically Signed   By: Awilda Metro   On: 01/25/2014 00:28   Dg Chest Portable 1 View  01/24/2014   CLINICAL DATA:  Weakness.  History of diabetes.  EXAM: PORTABLE CHEST - 1 VIEW  COMPARISON:  One-view chest 06/27/2013.  FINDINGS: The heart is enlarged. There is no edema or effusion to suggest failure. No focal airspace disease is present. Pacing wires are stable. Advanced degenerative changes in the shoulders is again noted.  IMPRESSION: 1. Cardiomegaly without failure. 2. No acute cardiopulmonary disease.   Electronically Signed   By: Gennette Pac M.D.   On: 01/24/2014 21:28    Microbiology: Recent Results (from the past 240 hour(s))  CULTURE, BLOOD (ROUTINE X 2)     Status: None   Collection Time    01/27/14  3:30 PM      Result Value Ref Range Status   Specimen Description BLOOD RIGHT FOREARM   Final   Special Requests BOTTLES DRAWN AEROBIC ONLY 3CC   Final   Culture  Setup Time     Final   Value: 01/27/2014 22:48     Performed at [facility]   Culture     Final   Value: NO GROWTH 5 DAYS     Performed at [facility]   Report Status 02/02/2014 FINAL   Final  CULTURE, BLOOD (ROUTINE X 2)     Status: None   Collection Time    01/27/14  3:40 PM      Result Value Ref Range Status   Specimen Description BLOOD RIGHT HAND   Final   Special Requests BOTTLES DRAWN AEROBIC ONLY 2CC   Final   Culture  Setup Time     Final   Value: 01/27/2014 22:48     Performed at [facility]   Culture     Final   Value: NO GROWTH  5 DAYS     [facility]   Report Status 02/02/2014 FINAL   Final     Labs: Basic Metabolic Panel:  Recent Labs Lab 02/04/14 1238  NA 138  K 4.7  CL 98  CO2 25  GLUCOSE 107*  BUN 35*  CREATININE 1.49*   CALCIUM 9.8   Liver Function Tests:  Recent Labs Lab 02/04/14 1238  AST 19  ALT 20  ALKPHOS 113  BILITOT 0.4  PROT 7.3  ALBUMIN 3.6    Recent Labs Lab 02/04/14 1238  LIPASE 32    Recent Labs Lab 02/04/14 1238  AMMONIA 16   CBC:  Recent Labs Lab 02/04/14 1238  WBC 7.9  NEUTROABS 5.5  HGB 11.3*  HCT 35.3*  MCV 92.4  PLT 223   Cardiac Enzymes:  Recent Labs Lab 02/04/14 1238  TROPONINI <0.30   BNP: BNP (last 3 results)  Recent Labs  01/26/14 1030  PROBNP 9027.0*   CBG:  Recent Labs Lab 02/05/14 0754 02/05/14 1150 02/05/14 1646 02/05/14 2010 02/06/14 0745  GLUCAP 104* 175* 148* 128* 91       Signed:  BURIEV, ULUGBEK N  [facility] 02/06/2014, 9:28 AM

## 2014-02-07 ENCOUNTER — Inpatient Hospital Stay (HOSPITAL_COMMUNITY): Payer: Medicare Other

## 2014-02-07 DIAGNOSIS — E43 Unspecified severe protein-calorie malnutrition: Secondary | ICD-10-CM | POA: Diagnosis present

## 2014-02-07 DIAGNOSIS — N183 Chronic kidney disease, stage 3 unspecified: Secondary | ICD-10-CM

## 2014-02-07 LAB — GLUCOSE, CAPILLARY
Glucose-Capillary: 109 mg/dL — ABNORMAL HIGH (ref 70–99)
Glucose-Capillary: 99 mg/dL (ref 70–99)
Glucose-Capillary: 153 mg/dL — ABNORMAL HIGH (ref 70–99)

## 2014-02-07 MED ORDER — CARVEDILOL 6.25 MG PO TABS: 6.2500 mg | ORAL_TABLET | Freq: Two times a day (BID) | ORAL | Status: DC

## 2014-02-07 MED ORDER — SODIUM CHLORIDE 0.9 % IJ SOLN
10.0000 mL | Freq: Two times a day (BID) | INTRAMUSCULAR | Status: DC
  Administered 2014-02-08: 10 mL

## 2014-02-07 MED ORDER — CARVEDILOL 6.25 MG PO TABS
6.2500 mg | ORAL_TABLET | Freq: Two times a day (BID) | ORAL | Status: DC
  Administered 2014-02-07: 6.25 mg via ORAL
  Filled 2014-02-07 (×4): qty 1

## 2014-02-07 MED ORDER — ALPRAZOLAM 0.25 MG PO TABS: 0.2500 mg | ORAL_TABLET | Freq: Two times a day (BID) | ORAL | Status: DC

## 2014-02-07 MED ORDER — FUROSEMIDE 20 MG PO TABS
20.0000 mg | ORAL_TABLET | Freq: Every day | ORAL | Status: DC
  Administered 2014-02-07: 20 mg via ORAL
  Filled 2014-02-07 (×2): qty 1

## 2014-02-07 MED ORDER — HYDROCODONE-ACETAMINOPHEN 10-325 MG PO TABS: 1.0000 | ORAL_TABLET | Freq: Four times a day (QID) | ORAL | Status: DC

## 2014-02-07 MED ORDER — SODIUM CHLORIDE 0.9 % IJ SOLN
10.0000 mL | INTRAMUSCULAR | Status: DC | PRN
  Administered 2014-02-07: 10 mL

## 2014-02-07 NOTE — ED Provider Notes (Signed)
Patient seen and examined independently. Care discussed with PA. Patient is without localized weakness on neurological exam. Complains of generalized weakness and is unable to sit independently. States she is unable to walk even with her walker at home. I agree with admission as above. PAs note reviewed, and I agree.  Rolland Porter, MD 02/07/14 [phone]

## 2014-02-07 NOTE — Clinical Social Work Note (Signed)
CSW continues to follow this patient for d/c plan. CSW met with patient and provided patient with the following bed offers: [facility], [facility], [facility]. Patient stated she would rather go home if she is unable to go to [facility]. Patient requested CSW speak with her daughter about bed offers. CSW spoke with patient's daughter Jocelyn Lamer via telephone. Jocelyn Lamer asked questions regarding placement offers, specifically [facility], CSW made daughter aware [facility] has not responded to referral. Patient's daughter reported patient cannot go home and requested bed offers. CSW provided [place] with bed offers. Per Jocelyn Lamer, she and her sister Olin Hauser) will view the following facilities: [facility], [facility]. Jocelyn Lamer stated she felt as if they were being forced to take patient home. CSW made daughter aware patient is medically stable for d/c and there are 2 facilities who will receive patient on this date. Patient's daughter stated she would prefer patient to d/c to [place]. CSW informed daughter the is no guarantee [facility] will offer bed. CSW further made daughter aware insurance will not cover hospital stay as patient is medically stable. Patient's daughter stated she will view facilities and follow-up. CSW informed patient's daughter to follow-up by 4pm. CSW made RN, Rey, aware. RN made MD aware.   [place], [place] Weekend Clinical Social Worker [phone]

## 2014-02-07 NOTE — Discharge Summary (Signed)
Physician Discharge Summary  [name] [name]:[id number] DOB: March 01, 1925 DOA: 02/04/2014  PCP: Josue Hector, MD  Admit date: 02/04/2014 Discharge date: 02/07/2014  Time spent: >35 minutes  Recommendations for Outpatient Follow-up:  SNF F/u with PCP in 1-2 weeks after discharge  Discharge Diagnoses:  Principal Problem:   Weakness Active Problems:   Tachycardia-bradycardia syndrome   Chronic systolic CHF (congestive heart failure)   CKD (chronic kidney disease), stage III   Anemia   Generalized weakness   Protein-calorie malnutrition, severe   Discharge Condition: stable   Diet recommendation: lwo sodium   Filed Weights   02/04/14 1900 02/06/14 0400 02/07/14 0400  Weight: 48.308 kg (106 lb 8 oz) 51.256 kg (113 lb) 46.72 kg (103 lb)    History of present illness:  80 y.o. female with DM, CAD s/p CABG, HTN, HLD, CHF, PAF not on AC (risk of fall, bleeding), s/p PPM, dementia, recently admitted for sepsis, bacteremia on IV atx (lasty day 02/10/14) presented with generalized weakness   Hospital Course:  1. Generalized Weakness likely deconditioning due to worsening chronic medical illness, recent sepsis, bacteremia  -cont supportive care, PT eval: SNF; Pt would benefit from SNF, but initially refusing, family would like to talk to her again; Pt agreed to go for SNF  2. ? Facial asymmetry, aphasia on presentation, not present on exam; neuro exam no focal;  -Pt denies aphasia, CT: Mild diffuse cortical atrophy. Mild chronic ischemic white matter disease. No acute intracranial abnormality seen  -? Benzo related; changed xanax to prn; underlying worsening, deconditioning; cont ASA, statin, PT;  3. A-fib, s/p PPM; cont Amio 100mg  daily. coreg 12.5bid  - Not a candidate for systemic anti-coagulation due to multiple comorbidities and her frequent fall  4. Recent bacteremia - on Invanz; afebrile; WBCs WNL; cont atx last day 02/10/14  afebrile; no leukocytosis  5. Combined systolic  and diastolic heart failure; no active sign of acute exacerbation of CHF  - resume Lasix ; Echo/15/2014, EF 30-35%, grade 2 diastolic dysfunction  -deceased NTG, BB, lasix due to low BP; Pt is euvolemic  6. CKD: cont outpatient follow up with BMP next week; Pt needs lasix for CHF 7. CAD s/p stent and CABG 2004  - No complaints of angina. Continue medical management  8. Initial hypotension, decreased BP meds; no blood pressure improving, adjusting meds as needed  d/w patient, confirmed with her son    -Pt is DNR, Pt is declining overall; Family is planning for possible placement, But patient is refusing  -Pt agreed for short  SNF, SW involved; d/c SNF when bed Is available     Procedures:  none (i.e. Studies not automatically included, echos, thoracentesis, etc; not x-rays)  Consultations:  noen  Discharge Exam: Filed Vitals:   02/07/14 0748  BP: 152/43  Pulse: 70  Temp: 97.9 F (36.6 C)  Resp: 16    General: alert Cardiovascular: s1,s2 rrr Respiratory: CTA BL  Discharge Instructions  Discharge Instructions   Diet - low sodium heart healthy    Complete by:  As directed      Diet - low sodium heart healthy    Complete by:  As directed      Discharge instructions    Complete by:  As directed   Please follow up with primary care doctor in 1 week     Discharge instructions    Complete by:  As directed   Please follow up with primary care doctor in 1 week after discharge  Increase activity slowly    Complete by:  As directed      Increase activity slowly    Complete by:  As directed             Medication List         ALPRAZolam 0.25 MG tablet  Commonly known as:  XANAX  Take 1 tablet (0.25 mg total) by mouth 2 (two) times daily.     amiodarone 200 MG tablet  Commonly known as:  PACERONE  Take 100 mg by mouth daily.     aspirin EC 81 MG tablet  Take 81 mg by mouth daily.     atorvastatin 10 MG tablet  Commonly known as:  LIPITOR  Take 10 mg by  mouth at bedtime.     carvedilol 6.25 MG tablet  Commonly known as:  COREG  Take 1 tablet (6.25 mg total) by mouth 2 (two) times daily with a meal.     Cholecalciferol 1000 UNITS capsule  Take 1,000 Units by mouth daily.     cyanocobalamin 1000 MCG/ML injection  Commonly known as:  (VITAMIN B-12)  Inject 1,000 mcg into the muscle every 30 (thirty) days. Done at Dr. Joyce Copa office     docusate sodium 100 MG capsule  Commonly known as:  COLACE  Take 200 mg by mouth 2 (two) times daily.     furosemide 40 MG tablet  Commonly known as:  LASIX  Take 0.5 tablets (20 mg total) by mouth daily.     HYDROcodone-acetaminophen 10-325 MG per tablet  Commonly known as:  NORCO  Take 1 tablet by mouth every 6 (six) hours. scheduled     INVANZ IV  - Inject 500 mg into the vein See admin instructions. Invanz 500 mg in 100 ml NS (provided by [facility], [place], [place]) Allow to warm to room temperature.  Infuse over 30 minutes via gravity tubing every 24 hours as directed (10 am every morning) Set drip rate at 50 drops per minute (change tubing once every 24 hours)  - Start date June 18th, stop date July 1st     isosorbide mononitrate 15 mg Tb24 24 hr tablet  Commonly known as:  IMDUR  Take 0.5 tablets (15 mg total) by mouth daily.     lactose free nutrition Liqd  Take 237 mLs by mouth 3 (three) times daily between meals.     levothyroxine 88 MCG tablet  Commonly known as:  SYNTHROID, LEVOTHROID  Take 88 mcg by mouth daily before breakfast.     nitroGLYCERIN 0.4 MG SL tablet  Commonly known as:  NITROSTAT  Place 0.4 mg under the tongue every 5 (five) minutes as needed for chest pain.     omeprazole 40 MG capsule  Commonly known as:  PRILOSEC  Take 1 capsule (40 mg total) by mouth daily.     potassium chloride 10 MEQ tablet  Commonly known as:  K-DUR,KLOR-CON  Take 10 mEq by mouth daily.     sodium chloride 0.9 % injection  10-40 mLs by Intracatheter route as needed (flush).      tamsulosin 0.4 MG Caps capsule  Commonly known as:  FLOMAX  Take 1 capsule (0.4 mg total) by mouth daily.       Allergies  Allergen Reactions  . Heparin Other (See Comments)    Clots blood instead of thinning  . Hydromorphone Hcl Other (See Comments)    Crazy feeling  . Warfarin And Related Other (See Comments)  Syncope events  . Altace [Ramipril] Hives  . Contrast Media [Iodinated Diagnostic Agents] Other (See Comments)    Reaction noted by md- might have caused aneurism in 1999  . Morphine And Related Nausea And Vomiting  . Plavix [Clopidogrel Bisulfate] Hives and Itching       Follow-up Information   Follow up with Josue Hector, MD In 1 week.   Specialty:  Family Medicine   Contact information:   [address] [place] [address] [phone]       Follow up with [facility]. (Registered Nurse and Physical Therapy)    Contact information:   [address] [place] [address] [phone]       Follow up with Josue Hector, MD In 1 week.   Specialty:  Family Medicine   Contact information:   [address] [place] [address] [phone]        The results of significant diagnostics from this hospitalization (including imaging, microbiology, ancillary and laboratory) are listed below for reference.    Significant Diagnostic Studies: Ct Abdomen Pelvis Wo Contrast  01/24/2014   CLINICAL DATA:  Lethargy and weakness.  Epigastric abdominal pain.  EXAM: CT ABDOMEN AND PELVIS WITHOUT CONTRAST  TECHNIQUE: Multidetector CT imaging of the abdomen and pelvis was performed following the standard protocol without IV contrast.  COMPARISON:  Pelvic ultrasound performed 12/02/2012, and CT of the abdomen and pelvis performed 07/24/2010  FINDINGS: The visualized lung bases are clear. The patient is status post median sternotomy. Pacemaker leads are partially imaged. Scattered coronary artery calcifications are seen.   Prominence of the intrahepatic biliary ducts likely remains within normal limits status post cholecystectomy. Clips are noted along the gallbladder fossa. The liver and spleen are otherwise unremarkable in appearance. The pancreas and left adrenal gland are unremarkable. A 1.6 cm rounded density at the right adrenal gland is nonspecific in appearance, but has increased only mildly in size from 2011, and is likely benign.  The kidneys are unremarkable in appearance. There is no evidence of hydronephrosis. Slight prominence of both ureters remains within normal limits. There is no evidence of distal obstruction. A calcification adjacent to the left vesicoureteral junction is stable from 2011 and likely vascular in nature. No renal or ureteral stones are seen. Mild nonspecific perinephric stranding is noted bilaterally. Bilateral extrarenal pelves are seen.  No free fluid is identified. The small bowel is unremarkable in appearance. The stomach is within normal limits. No acute vascular abnormalities are seen.  There is ectasia of the infrarenal abdominal aorta, measuring 2.9 cm in maximal transverse dimension and 2.3 cm in AP dimension. There is no evidence of aneurysmal dilatation. Diffuse calcification is seen along the abdominal aorta and its branches. Stents are noted within both proximal renal arteries, and there is relatively diffuse calcification along the superior mesenteric artery.  The appendix is not definitely seen; there is no evidence for appendicitis. Minimal diverticulosis is noted along the mid sigmoid colon, without evidence of diverticulitis. Dense stool is seen within the rectum.  The bladder is significantly distended and grossly unremarkable in appearance. The uterus is within normal limits, aside from scattered calcification. The ovaries are relatively symmetric. No suspicious adnexal masses are seen. No inguinal lymphadenopathy is seen. Postoperative change is noted at the right inguinal  region.  No acute osseous abnormalities are identified. Vacuum phenomenon and disc space narrowing are seen at L4-L5.  IMPRESSION: 1. No definite acute abnormalities seen in the abdomen or pelvis. 2. Diffuse calcification  along the abdominal aorta and its branches, with relatively diffuse calcification involving the superior mesenteric artery. Stents within both proximal renal arteries are grossly unremarkable in appearance. Ectasia of the infrarenal abdominal aorta, without evidence of aneurysmal dilatation. 3. Minimal diverticulosis along the mid sigmoid colon, without evidence of diverticulitis. 4. Scattered coronary artery calcifications seen. 5. 1.6 cm rounded density at the right adrenal gland is nonspecific, but is increased only mildly in size from 2011, and is likely benign.   Electronically Signed   By: Roanna Raider M.D.   On: 01/24/2014 22:10   Ct Head Wo Contrast  02/04/2014   CLINICAL DATA:  Right leg weakness.  EXAM: CT HEAD WITHOUT CONTRAST  TECHNIQUE: Contiguous axial images were obtained from the base of the skull through the vertex without intravenous contrast.  COMPARISON:  CT scan of August 04, 2012.  FINDINGS: Bony calvarium appears intact. Mild diffuse cortical atrophy is noted. Mild chronic ischemic white matter disease is noted. No mass effect or midline shift is noted. Ventricular size is within normal limits. There is no evidence of mass lesion, hemorrhage or acute infarction.  IMPRESSION: Mild diffuse cortical atrophy. Mild chronic ischemic white matter disease. No acute intracranial abnormality seen.   Electronically Signed   By: Roque Lias M.D.   On: 02/04/2014 14:05   US Abdomen Complete  01/25/2014   CLINICAL DATA:  Upper abdominal pain, elevated liver function tests. Status post cholecystectomy.  EXAM: ULTRASOUND ABDOMEN COMPLETE  COMPARISON:  CT of the abdomen and pelvis January 24, 2014 at 2152 hr  FINDINGS: Gallbladder:  Surgically absent.  Common bile duct:  Diameter: 8  mm, no sonographic findings of choledocholithiasis.  Liver:  Mildly heterogeneous echotexture may reflect fatty infiltration without intrahepatic biliary dilatation. No perihepatic free fluid. Hepatopetal portal vein.  IVC:  No abnormality visualized.  Pancreas:  Visualized portion unremarkable.  Spleen:  Size and appearance within normal limits.  Right Kidney:  Length: 8.5 cm. Echogenicity within normal limits. No mass or hydronephrosis visualized. Mild thinning of the renal cortex. Extra renal pelvis.  Left Kidney:  Length: 10.3 cm. Echogenicity within normal limits. 3.7 x 3.2 x 2.9 cm air anechoic cyst in upper pole left kidney as seen on prior CT. No hydronephrosis visualized. Mild thinning of the renal cortex.  Abdominal aorta: Limited assessment due to bowel gas and presumed calcific atherosclerosis ; ectatic aorta is seen on CT of abdomen and pelvis from same day, reported separately.  No aneurysm visualized.  Other findings:  None.  IMPRESSION: No acute abdominal process by sonography. Status post cholecystectomy.  Mild symmetric cortical renal atrophy.   Electronically Signed   By: Awilda Metro   On: 01/25/2014 00:28   Dg Chest Portable 1 View  01/24/2014   CLINICAL DATA:  Weakness.  History of diabetes.  EXAM: PORTABLE CHEST - 1 VIEW  COMPARISON:  One-view chest 06/27/2013.  FINDINGS: The heart is enlarged. There is no edema or effusion to suggest failure. No focal airspace disease is present. Pacing wires are stable. Advanced degenerative changes in the shoulders is again noted.  IMPRESSION: 1. Cardiomegaly without failure. 2. No acute cardiopulmonary disease.   Electronically Signed   By: Gennette Pac M.D.   On: 01/24/2014 21:28    Microbiology: No results found for this or any previous visit (from the past 240 hour(s)).   Labs: Basic Metabolic Panel:  Recent Labs Lab 02/04/14 1238  NA 138  K 4.7  CL 98  CO2 25  GLUCOSE  107*  BUN 35*  CREATININE 1.49*  CALCIUM 9.8   Liver  Function Tests:  Recent Labs Lab 02/04/14 1238  AST 19  ALT 20  ALKPHOS 113  BILITOT 0.4  PROT 7.3  ALBUMIN 3.6    Recent Labs Lab 02/04/14 1238  LIPASE 32    Recent Labs Lab 02/04/14 1238  AMMONIA 16   CBC:  Recent Labs Lab 02/04/14 1238  WBC 7.9  NEUTROABS 5.5  HGB 11.3*  HCT 35.3*  MCV 92.4  PLT 223   Cardiac Enzymes:  Recent Labs Lab 02/04/14 1238  TROPONINI <0.30   BNP: BNP (last 3 results)  Recent Labs  01/26/14 1030  PROBNP 9027.0*   CBG:  Recent Labs Lab 02/06/14 0745 02/06/14 1149 02/06/14 1646 02/06/14 1944 02/07/14 0746  GLUCAP 91 139* 100* 102* 109*       Signed:  BURIEV, ULUGBEK N  [facility] 02/07/2014, 8:53 AM

## 2014-02-07 NOTE — ED Provider Notes (Signed)
Medical screening examination/treatment/procedure(s) were conducted as a shared visit with non-physician practitioner(s) and myself.  I personally evaluated the patient during the encounter.   EKG Interpretation   Date/Time:  Thursday February 04 2014 11:44:51 EDT Ventricular Rate:  105 PR Interval:    QRS Duration: 99 QT Interval:  382 QTC Calculation: 505 R Axis:   75 Text Interpretation:  Afib/flut and V-paced complexes No further rhythm  analysis attempted due to paced rhythm Probable LVH with secondary repol  abnrm Prolonged QT interval ED PHYSICIAN INTERPRETATION AVAILABLE IN [facility] Confirmed by TEST, Record ([id number]) on 02/06/2014 8:45:10 AM        Rolland Porter, MD 02/07/14 [phone]

## 2014-02-07 NOTE — Clinical Social Work Placement (Signed)
Clinical Social Work Department CLINICAL SOCIAL WORK PLACEMENT NOTE 02/07/2014  Patient:  [name] [name], [name] [name]  Account Number:  [id number] Admit date:  02/04/2014  Clinical Social Worker:  Vivi Barrack, [place]  Date/time:  02/06/2014 05:04 PM  Clinical Social Work is seeking post-discharge placement for this patient at the following level of care:   SKILLED NURSING   (*CSW will update this form in Epic as items are completed)   02/06/2014  Patient/family provided with [facility] Department of Clinical Social Work's list of facilities offering this level of care within the geographic area requested by the patient (or if unable, by the patient's family).  02/06/2014  Patient/family informed of their freedom to choose among providers that offer the needed level of care, that participate in Medicare, Medicaid or managed care program needed by the patient, have an available bed and are willing to accept the patient.  02/06/2014  Patient/family informed of [facility]' ownership interest in [facility], as well as of the fact that they are under no obligation to receive care at this facility.  PASARR submitted to EDS on Existing PASARR number received on Existing  FL2 transmitted to all facilities in geographic area requested by pt/family on  02/06/2014 FL2 transmitted to all facilities within larger geographic area on   Patient informed that his/her managed care company has contracts with or will negotiate with  certain facilities, including the following:     Patient/family informed of bed offers received:  02/07/2014 Patient chooses bed at  Physician recommends and patient chooses bed at    Patient to be transferred to  on   Patient to be transferred to facility by  Patient and family notified of transfer on  Name of family member notified:    The following physician request were entered in Epic:   Additional Comments:  Mackenzi Krogh [place],  LCSWA Weekend Clinical Social Worker [phone]

## 2014-02-07 NOTE — Progress Notes (Signed)
[facility] PROGRESS NOTE  [name] [name] VEL:[id number] DOB: 10-17-1924 DOA: 02/04/2014 PCP: Josue Hector, MD  Assessment/Plan: 80 y.o. female with DM, CAD s/p CABG, HTN, HLD, CHF, PAF not on AC (risk of fall, bleeding), s/p PPM, dementia, recently admitted for sepsis, bacteremia on IV atx (lasty day 02/10/14) presented with generalized weakness  1. Generalized Weakness likely deconditioning due to worsening chronic medical illness, recent sepsis, bacteremia  -cont supportive care, PT evalSNF;  Pt would benefit from SNF, but initially refusing, family would like to talk to her again; Pt agreed to go for SNF 2. ? Facial asymmetry, aphasia on presentation, not present on exam; neuro exam no focal;  -Pt denies aphasia, CT: Mild diffuse cortical atrophy. Mild chronic ischemic white matter disease. No acute intracranial abnormality seen  -? Benzo related; changed xanax to prn; underlying worsening, deconditioning; cont ASA, statin, PT;  3. A-fib, s/p PPM; cont Amio 100mg  daily. coreg 12.5bid  - Not a candidate for systemic anti-coagulation due to multiple comorbidities and her frequent fall  4. Recent bacteremia - on Invanz; afebrile; WBCs WNL; cont atx last day 02/10/14  afebrile; no leukocytosis  5. Combined systolic and diastolic heart failure; no active sign of acute exacerbation of CHF  - resume Lasix ; Echo/15/2014, EF 30-35%, grade 2 diastolic dysfunction  -deceased NTG, BB, lasix due to low BP; Pt is euvolemic  6. CKD: SCr/BUN have increased compared to 01/24/14(1.29)  7. CAD s/p stent and CABG 2004  - No complaints of angina. Continue medical management  8. Initial hypotension, decreased BP meds; no blood pressure improving, adjusting meds as needed   d/w patient, confirmed with her son  -Pt is DNR  -Pt now is agreed for SNF, SW involved; d/c SNF when bed  Is available    Code Status: DNR Family Communication: d/w patient, her son  (indicate person spoken with,  relationship, and if by phone, the number) Disposition Plan: will benefit from placement, but Pt is refusing    Consultants:  none  Procedures:  echop pend   Antibiotics:  inzanz  (indicate start date, and stop date if known)  HPI/Subjective: alert  Objective: Filed Vitals:   02/07/14 0748  BP: 152/43  Pulse: 70  Temp: 97.9 F (36.6 C)  Resp: 16    Intake/Output Summary (Last 24 hours) at 02/07/14 0847 Last data filed at 02/06/14 1800  Gross per 24 hour  Intake    240 ml  Output      0 ml  Net    240 ml   Filed Weights   02/04/14 1900 02/06/14 0400 02/07/14 0400  Weight: 48.308 kg (106 lb 8 oz) 51.256 kg (113 lb) 46.72 kg (103 lb)    Exam:   General:  alert  Cardiovascular: s1,s2 rrr  Respiratory: CTA BL  Abdomen: soft, nt,nd   Musculoskeletal: no LE edema   Data Reviewed: Basic Metabolic Panel:  Recent Labs Lab 02/04/14 1238  NA 138  K 4.7  CL 98  CO2 25  GLUCOSE 107*  BUN 35*  CREATININE 1.49*  CALCIUM 9.8   Liver Function Tests:  Recent Labs Lab 02/04/14 1238  AST 19  ALT 20  ALKPHOS 113  BILITOT 0.4  PROT 7.3  ALBUMIN 3.6    Recent Labs Lab 02/04/14 1238  LIPASE 32    Recent Labs Lab 02/04/14 1238  AMMONIA 16   CBC:  Recent Labs Lab 02/04/14 1238  WBC 7.9  NEUTROABS 5.5  HGB 11.3*  HCT 35.3*  MCV 92.4  PLT 223   Cardiac Enzymes:  Recent Labs Lab 02/04/14 1238  TROPONINI <0.30   BNP (last 3 results)  Recent Labs  01/26/14 1030  PROBNP 9027.0*   CBG:  Recent Labs Lab 02/06/14 0745 02/06/14 1149 02/06/14 1646 02/06/14 1944 02/07/14 0746  GLUCAP 91 139* 100* 102* 109*    No results found for this or any previous visit (from the past 240 hour(s)).   Studies: No results found.  Scheduled Meds: . amiodarone  100 mg Oral Daily  . aspirin EC  81 mg Oral Daily  . atorvastatin  10 mg Oral QHS  . carvedilol  6.25 mg Oral BID WC  . cyanocobalamin  1,000 mcg Intramuscular Q30 days  .  docusate sodium  200 mg Oral BID  . ertapenem  500 mg Intravenous Q24H  . furosemide  20 mg Oral Daily  . HYDROcodone-acetaminophen  1 tablet Oral Q6H  . isosorbide mononitrate  15 mg Oral Daily  . lactose free nutrition  237 mL Oral TID BM  . levothyroxine  88 mcg Oral QAC breakfast  . pantoprazole  40 mg Oral Daily  . potassium chloride  10 mEq Oral Daily  . tamsulosin  0.4 mg Oral Daily   Continuous Infusions:   Principal Problem:   Weakness Active Problems:   Tachycardia-bradycardia syndrome   Chronic systolic CHF (congestive heart failure)   CKD (chronic kidney disease), stage III   Anemia   Generalized weakness   Protein-calorie malnutrition, severe    Time spent: >35 minutes     [name], ULUGBEK N  [facility] Pager [phone]. If 7PM-7AM, please contact night-coverage at [email or web address], password [email or web address] 02/07/2014, 8:47 AM  LOS: 3 days

## 2014-02-07 NOTE — Progress Notes (Signed)
Peripherally Inserted Central Catheter/Midline Placement  The IV Nurse has discussed with the patient and/or persons authorized to consent for the patient, the purpose of this procedure and the potential benefits and risks involved with this procedure.  The benefits include less needle sticks, lab draws from the catheter and patient may be discharged home with the catheter.  Risks include, but not limited to, infection, bleeding, blood clot (thrombus formation), and puncture of an artery; nerve damage and irregular heat beat.  Alternatives to this procedure were also discussed.  PICC/Midline Placement Documentation  PICC / Midline Single Lumen 02/07/14 PICC Right Brachial 33 cm 1 cm (Active)  Indication for Insertion or Continuance of Line Home intravenous therapies (PICC only) 02/07/2014  9:01 AM  Exposed Catheter (cm) 1 cm 02/07/2014  9:01 AM  Line Status Flushed;Saline locked;Blood return noted 02/07/2014  9:01 AM  Dressing Change Due 02/14/14 02/07/2014  9:01 AM       Ethelda Chick 02/07/2014, 9:12 AM

## 2014-02-07 NOTE — Clinical Social Work Psychosocial (Signed)
Clinical Social Work Department BRIEF PSYCHOSOCIAL ASSESSMENT 02/07/2014  Patient:  [name] [name], [name] [name]     Account Number:  [id number]     Admit date:  02/04/2014  Clinical Social Worker:  Hubert Azure  Date/Time:  02/06/2014 04:51 PM  Referred by:  Physician  Date Referred:  02/06/2014 Referred for  SNF Placement   Other Referral:   Interview type:  Patient Other interview type:   Patient's son was also present at bedside.    PSYCHOSOCIAL DATA Living Status:  WITH ADULT CHILDREN Admitted from facility:   Level of care:   Primary support name:  Jocelyn Lamer [phone]) Primary support relationship to patient:  CHILD, ADULT Degree of support available:   Good.    CURRENT CONCERNS Current Concerns  Post-Acute Placement   Other Concerns:    SOCIAL WORK ASSESSMENT / PLAN CSW met with patient who was alert and oriented x4 at bedside. Patient's son was also present at bedside. CSW introduced self and explained role. CSW discussed d/c plan with patient. Per patient, she was at [facility] for 14 months and thought her children had left her there to stay. Patient stated she told her son she wanted to leave and he signed her out of facility. Patient's son stated his siblings were "mad" at him for that. Patient reported she could ambulate "just fine" around the home and her adult daughter, she didn't state who, would come live with her and assist her. CSW asked patient if daughter could provide 24 hour assistance. Patient's son stated he as well as his siblings are agreeable to SNF placement as no one can provide the 24 hour supervision she requires. Per son, he just had a stroke and one sibling resides in [place]. Patient stated she would be able to pay for a nurse aide. Patient's son reported patient does not have the money to afford an aide.    Patient then considered statements made by son and agreed to SNF placement. Patient preference is [facility]. CSW  encouraged patient to consider placement alternatives as preference facility is not a guarantee. Patient stated she consider referral to other facilities, but prefers [facility].   Assessment/plan status:  Psychosocial Support/Ongoing Assessment of Needs Other assessment/ plan:   CSW to update FL2 for placement and family when bed offers become available.   Information/referral to community resources:    PATIENT'S/FAMILY'S RESPONSE TO PLAN OF CARE: Patient was hesitant to agree to SNF placement, after considerable discussion with son and daughter by phone, prefers placement to [facility].   [place], [place] Weekend Clinical Social Worker [phone]

## 2014-02-08 DIAGNOSIS — I5043 Acute on chronic combined systolic (congestive) and diastolic (congestive) heart failure: Secondary | ICD-10-CM

## 2014-02-08 DIAGNOSIS — I480 Paroxysmal atrial fibrillation: Secondary | ICD-10-CM | POA: Diagnosis present

## 2014-02-08 LAB — GLUCOSE, CAPILLARY: Glucose-Capillary: 161 mg/dL — ABNORMAL HIGH (ref 70–99)

## 2014-02-08 MED ORDER — HYDROCODONE-ACETAMINOPHEN 10-325 MG PO TABS: 1.0000 | ORAL_TABLET | Freq: Four times a day (QID) | ORAL | Status: DC

## 2014-02-08 MED ORDER — ONDANSETRON HCL 4 MG/2ML IJ SOLN
INTRAMUSCULAR | Status: AC
  Administered 2014-02-08: 4 mg via INTRAVENOUS
  Filled 2014-02-08: qty 2

## 2014-02-08 MED ORDER — ONDANSETRON HCL 4 MG/2ML IJ SOLN
4.0000 mg | Freq: Four times a day (QID) | INTRAMUSCULAR | Status: DC | PRN
  Administered 2014-02-08: 4 mg via INTRAVENOUS

## 2014-02-08 MED ORDER — ALPRAZOLAM 0.25 MG PO TABS: 0.2500 mg | ORAL_TABLET | Freq: Two times a day (BID) | ORAL | Status: DC

## 2014-02-08 NOTE — Progress Notes (Addendum)
CSW Proofreader) spoke with both of pts daughters and informed pt is ready for dc today so facility choice will have to be made. CSW informed daughters that [facility] does not have any beds available. CSW provided with alternate bed offers. Pt daughters wanting CSW to call [facility]. CSW agreed to do so but asked pt children to review available offers and have back up facility choice.   CSW called and spoke with Engineer, manufacturing at [facility]. She informed CSW that nursing is reviewing pt clinicals and facility will call CSW to inform if they can make a bed offer.  ADDENDUM: CSW aware that [facility] has made a bed offer electronically. CSW notified family and they want to accept bed offer. Per pt daughter, pt will need non-emergent ambulance transport. CSW (Clinical Child psychotherapist) prepared pt dc packet and placed with shadow chart. CSW arranged non-emergent ambulance transport for 1pm as requested by facility. Pt, pt family, pt nurse, and facility informed. CSW signing off.   Poonum Ambelal, LCSWA [phone]

## 2014-02-08 NOTE — Progress Notes (Signed)
[facility] PROGRESS NOTE  CONSWELLO PEVEY NBV:[id number] DOB: November 23, 1924 DOA: 02/04/2014 PCP: Josue Hector, MD  Assessment/Plan: 80 y.o. female with DM, CAD s/p CABG, HTN, HLD, CHF, PAF not on AC (risk of fall, bleeding), s/p PPM, dementia, recently admitted for sepsis, bacteremia on IV atx (lasty day 02/10/14) presented with generalized weakness  1. Generalized Weakness likely deconditioning due to worsening chronic medical illness, recent sepsis, bacteremia  -cont supportive care, PT evalSNF;  Pt would benefit from SNF, but initially refusing, family would like to talk to her again; Pt agreed to go for SNF 2. ? Facial asymmetry, aphasia on presentation, not present on exam; neuro exam no focal;  -Pt denies aphasia, CT: Mild diffuse cortical atrophy. Mild chronic ischemic white matter disease. No acute intracranial abnormality seen  -? Benzo related; changed xanax to prn; underlying worsening, deconditioning; cont ASA, statin, PT;  3. A-fib, s/p PPM; cont Amio 100mg  daily. coreg 12.5bid  - Not a candidate for systemic anti-coagulation due to multiple comorbidities and her frequent fall  4. Recent bacteremia - on Invanz; afebrile; WBCs WNL; cont atx last day 02/10/14  afebrile; no leukocytosis  5. Combined systolic and diastolic heart failure; no active sign of acute exacerbation of CHF  - resume Lasix ; Echo/15/2014, EF 30-35%, grade 2 diastolic dysfunction  -deceased NTG, BB, lasix due to low BP; Pt is euvolemic  6. CKD: SCr/BUN have increased compared to 01/24/14(1.29)  7. CAD s/p stent and CABG 2004  - No complaints of angina. Continue medical management  8. Initial hypotension, decreased BP meds; no blood pressure improving, adjusting meds as needed   d/w patient, confirmed with her son  -Pt is DNR  -Pt now is agreed for SNF, SW involved; d/c SNF when bed  Is available    Code Status: DNR Family Communication: d/w patient, her son  (indicate person spoken with,  relationship, and if by phone, the number) Disposition Plan: snf when bed is available    Consultants:  none  Procedures:  echop pend   Antibiotics:  inzanz  (indicate start date, and stop date if known)  HPI/Subjective: alert  Objective: Filed Vitals:   02/08/14 0838  BP: 96/49  Pulse: 100  Temp:   Resp: 20    Intake/Output Summary (Last 24 hours) at 02/08/14 0842 Last data filed at 02/07/14 2100  Gross per 24 hour  Intake      0 ml  Output    300 ml  Net   -300 ml   Filed Weights   02/04/14 1900 02/06/14 0400 02/07/14 0400  Weight: 48.308 kg (106 lb 8 oz) 51.256 kg (113 lb) 46.72 kg (103 lb)    Exam:   General:  alert  Cardiovascular: s1,s2 rrr  Respiratory: CTA BL  Abdomen: soft, nt,nd   Musculoskeletal: no LE edema   Data Reviewed: Basic Metabolic Panel:  Recent Labs Lab 02/04/14 1238  NA 138  K 4.7  CL 98  CO2 25  GLUCOSE 107*  BUN 35*  CREATININE 1.49*  CALCIUM 9.8   Liver Function Tests:  Recent Labs Lab 02/04/14 1238  AST 19  ALT 20  ALKPHOS 113  BILITOT 0.4  PROT 7.3  ALBUMIN 3.6    Recent Labs Lab 02/04/14 1238  LIPASE 32    Recent Labs Lab 02/04/14 1238  AMMONIA 16   CBC:  Recent Labs Lab 02/04/14 1238  WBC 7.9  NEUTROABS 5.5  HGB 11.3*  HCT 35.3*  MCV 92.4  PLT 223  Cardiac Enzymes:  Recent Labs Lab 02/04/14 1238  TROPONINI <0.30   BNP (last 3 results)  Recent Labs  01/26/14 1030  PROBNP 9027.0*   CBG:  Recent Labs Lab 02/06/14 1646 02/06/14 1944 02/07/14 0746 02/07/14 1139 02/07/14 1654  GLUCAP 100* 102* 109* 99 153*    No results found for this or any previous visit (from the past 240 hour(s)).   Studies: Dg Chest Port 1 View  02/07/2014   CLINICAL DATA:  PICC line placement  EXAM: PORTABLE CHEST - 1 VIEW  COMPARISON:  01/24/2014  FINDINGS: Cardiomegaly again noted. Status post CABG. 2 leads cardiac pacemaker is stable in position. There is right arm PICC line with tip  in SVC. No pneumothorax. No acute infiltrate or pulmonary edema. Degenerative changes bilateral shoulders.  IMPRESSION: Status post CABG. Cardiomegaly again noted. No active disease. Right PICC line in place. No pneumothorax.   Electronically Signed   By: Natasha MeadLiviu  Pop M.D.   On: 02/07/2014 10:26    Scheduled Meds: . amiodarone  100 mg Oral Daily  . aspirin EC  81 mg Oral Daily  . atorvastatin  10 mg Oral QHS  . carvedilol  6.25 mg Oral BID WC  . cyanocobalamin  1,000 mcg Intramuscular Q30 days  . docusate sodium  200 mg Oral BID  . ertapenem  500 mg Intravenous Q24H  . furosemide  20 mg Oral Daily  . HYDROcodone-acetaminophen  1 tablet Oral Q6H  . isosorbide mononitrate  15 mg Oral Daily  . lactose free nutrition  237 mL Oral TID BM  . levothyroxine  88 mcg Oral QAC breakfast  . ondansetron      . pantoprazole  40 mg Oral Daily  . potassium chloride  10 mEq Oral Daily  . sodium chloride  10-40 mL Intracatheter Q12H  . tamsulosin  0.4 mg Oral Daily   Continuous Infusions:   Principal Problem:   Weakness Active Problems:   Tachycardia-bradycardia syndrome   Chronic systolic CHF (congestive heart failure)   CKD (chronic kidney disease), stage III   Anemia   Generalized weakness   Protein-calorie malnutrition, severe    Time spent: >35 minutes     [name], Lionell Matuszak N  [facility] Pager [phone]. If 7PM-7AM, please contact night-coverage at [email or web address], password [email or web address] 02/08/2014, 8:42 AM  LOS: 4 days

## 2014-02-08 NOTE — Clinical Social Work Placement (Signed)
Clinical Social Work Department CLINICAL SOCIAL WORK PLACEMENT NOTE 02/07/2014  Patient:  [name] [name], [name] [name]  Account Number:  [id number] Admit date:  02/04/2014  Clinical Social Worker:  Vivi Barrack, [place]  Date/time:  02/06/2014 05:04 PM  Clinical Social Work is seeking post-discharge placement for this patient at the following level of care:   SKILLED NURSING   (*CSW will update this form in Epic as items are completed)   02/06/2014  Patient/family provided with [facility] Department of Clinical Social Work's list of facilities offering this level of care within the geographic area requested by the patient (or if unable, by the patient's family).  02/06/2014  Patient/family informed of their freedom to choose among providers that offer the needed level of care, that participate in Medicare, Medicaid or managed care program needed by the patient, have an available bed and are willing to accept the patient.  02/06/2014  Patient/family informed of [facility]' ownership interest in [facility], as well as of the fact that they are under no obligation to receive care at this facility.  PASARR submitted to EDS on Existing PASARR number received on Existing  FL2 transmitted to all facilities in geographic area requested by pt/family on  02/06/2014 FL2 transmitted to all facilities within larger geographic area on   Patient informed that his/her managed care company has contracts with or will negotiate with  certain facilities, including the following:     Patient/family informed of bed offers received:  02/07/2014 Patient chooses bed at [facility] Physician recommends and patient chooses bed at    Patient to be transferred [facility]  On 02/08/2014   Patient to be transferred to facility by [facility] Patient and family notified of transfer on 02/08/2014 Name of family member notified:  Enriqueta Shutter  The following physician request were entered in  Epic:   Additional Comments:  Vivi Barrack, LCSWA Weekend Clinical Social Worker [phone]

## 2014-02-08 NOTE — Progress Notes (Signed)
Patient discharged to home.  Patient alert, oriented, verbally responsive, breathing regular and non-labored throughout, no s/s of distress noted throughout, no c/o pain throughout.  Report given to ambulance staff members.  Patient left unit per stretcher accompanied by two ambulance staff members.  VS WNL.  Reita Cliche 02/08/2014 1:22 PM

## 2014-02-08 NOTE — Progress Notes (Addendum)
    Subjective:  No complaints, no distress at rest  Objective:  Vital Signs in the last 24 hours: Temp:  [96 F (35.6 C)-98.3 F (36.8 C)] 96 F (35.6 C) (06/29 0523) Pulse Rate:  [52-100] 100 (06/29 0838) Resp:  [15-20] 20 (06/29 0838) BP: (96-143)/(49-105) 96/49 mmHg (06/29 0838) SpO2:  [94 %-97 %] 94 % (06/29 0523)  Intake/Output from previous day:  Intake/Output Summary (Last 24 hours) at 02/08/14 0945 Last data filed at 02/07/14 2100  Gross per 24 hour  Intake      0 ml  Output    300 ml  Net   -300 ml    Physical Exam: General appearance: alert, cooperative and no distress Lungs: kyphoscoliosis, slight crackles at bases Heart: irregularly irregular rhythm, 1/6 SEM RUSB JVP 8-9 cm  No edema   Rate: 110  Rhythm: atrial fibrillation  Lab Results: No results found for this basename: WBC, HGB, PLT,  in the last 72 hours No results found for this basename: NA, K, CL, CO2, GLUCOSE, BUN, CREATININE,  in the last 72 hours No results found for this basename: TROPONINI, CK, MB,  in the last 72 hours No results found for this basename: INR,  in the last 72 hours  Imaging: Imaging results have been reviewed  Cardiac Studies:  Assessment/Plan:   Principal Problem:   Weakness Active Problems:   Atrial fibrillation with RVR   CAD- CABG in '04, cath 7/11 [place]   CKD stage 3, GFR 30-59 ml/min   Tachycardia-bradycardia syndrome   Chronic systolic CHF (congestive heart failure)   Cardiac pacemaker in situ- MDT Feb 2013   Protein-calorie malnutrition, severe   PAF- no Coumadin secondary to falls.   HYPERLIPIDEMIA   HYPERTENSION   Sepsis- E. Coli June 2015   CKD (chronic kidney disease), stage III   Anemia    PLAN: Her HR is still fast, B/P is soft. Will review meds with MD- consider addition of Lanoxin. She can follow up with Dr Antoine Poche in [place]- we will arrange.   Corine Shelter PA-C Beeper [phone] 02/08/2014, 9:45 AM  Patient seen with PA, agree with the  above note.  She is going to be discharged to SNF today.  No anticoagulation given fall risk.  Continue current amiodarone and Coreg for rate control.  Followup with Dr. Antoine Poche.  Would continue current lasix for CHF.   Marca Ancona 02/08/2014 10:19 AM

## 2014-02-08 NOTE — Progress Notes (Signed)
Report given to Medical illustrator at [facility].  Reita Cliche 02/08/2014 1:33 PM

## 2014-03-10 ENCOUNTER — Ambulatory Visit (INDEPENDENT_AMBULATORY_CARE_PROVIDER_SITE_OTHER): Payer: PRIVATE HEALTH INSURANCE | Admitting: Cardiology

## 2014-03-10 ENCOUNTER — Encounter: Payer: Self-pay | Admitting: Cardiology

## 2014-03-10 VITALS — BP 150/83 | HR 96 | Ht 64.0 in | Wt 106.0 lb

## 2014-03-10 DIAGNOSIS — I5022 Chronic systolic (congestive) heart failure: Secondary | ICD-10-CM

## 2014-03-10 DIAGNOSIS — I4891 Unspecified atrial fibrillation: Secondary | ICD-10-CM

## 2014-03-10 DIAGNOSIS — I509 Heart failure, unspecified: Secondary | ICD-10-CM

## 2014-03-10 MED ORDER — CARVEDILOL 12.5 MG PO TABS: 12.5000 mg | ORAL_TABLET | Freq: Two times a day (BID) | ORAL | Status: DC

## 2014-03-10 NOTE — Progress Notes (Signed)
HPI The patient for followup of her hospitalization earlier this year for bacteremia failure to thrive. We did see her for management of her acute on chronic systolic and diastolic heart failure. We also suggested rate control  for her atrial fibrillation but reiterated that she's not an anticoagulation candidate because of all of her comorbidities.   She was kept on amiodarone and beta blockers for rate control. She is delighted not to the back at home. She has been out of her house for over 14 months. She thinks she's been doing remarkably well given her illnesses  She's not sleeping well. She feels a little bit weak.   However, she's eating well. She's not having any acute shortness of breath. She denies any chest discomfort. She has no new shortness of breath, PND or orthopnea. She has no weight gain or edema.  Allergies  Allergen Reactions  . Heparin Other (See Comments)    Clots blood instead of thinning  . Hydromorphone Hcl Other (See Comments)    Crazy feeling  . Warfarin And Related Other (See Comments)    Syncope events  . Altace [Ramipril] Hives  . Contrast Media [Iodinated Diagnostic Agents] Other (See Comments)    Reaction noted by md- might have caused aneurism in 1999  . Morphine And Related Nausea And Vomiting  . Plavix [Clopidogrel Bisulfate] Hives and Itching   Prior to Admission medications   Medication Sig Start Date End Date Taking? Authorizing Provider  amiodarone (PACERONE) 200 MG tablet Take 100 mg by mouth daily.   Yes Historical Provider, MD  aspirin EC 81 MG tablet Take 81 mg by mouth daily.   Yes Historical Provider, MD  carvedilol (COREG) 6.25 MG tablet Take 1 tablet (6.25 mg total) by mouth 2 (two) times daily with a meal. 02/07/14  Yes Esperanza Sheets, MD  Cholecalciferol 1000 UNITS capsule Take 1,000 Units by mouth daily.   Yes Historical Provider, MD  docusate sodium (COLACE) 100 MG capsule Take 200 mg by mouth 2 (two) times daily.    Yes Historical  Provider, MD  furosemide (LASIX) 40 MG tablet Take 20 mg by mouth. 02/06/14  Yes Esperanza Sheets, MD  HYDROcodone-acetaminophen (NORCO) 10-325 MG per tablet Take 1 tablet by mouth every 6 (six) hours. scheduled 02/08/14  Yes Esperanza Sheets, MD  isosorbide mononitrate (IMDUR) 15 mg TB24 24 hr tablet Take 0.5 tablets (15 mg total) by mouth daily. 02/06/14  Yes Esperanza Sheets, MD  levothyroxine (SYNTHROID, LEVOTHROID) 88 MCG tablet Take 88 mcg by mouth daily before breakfast.   Yes Historical Provider, MD  nitroGLYCERIN (NITROSTAT) 0.4 MG SL tablet Place 0.4 mg under the tongue every 5 (five) minutes as needed for chest pain.   Yes Historical Provider, MD  omeprazole (PRILOSEC) 40 MG capsule Take 1 capsule (40 mg total) by mouth daily. 11/27/12  Yes Rhonda G Barrett, PA-C  potassium chloride (K-DUR,KLOR-CON) 10 MEQ tablet Take 10 mEq by mouth daily.   Yes Historical Provider, MD  Tamsulosin HCl (FLOMAX) 0.4 MG CAPS Take 1 capsule (0.4 mg total) by mouth daily. 08/29/12  Yes Dayna N Dunn, PA-C  cyanocobalamin (,VITAMIN B-12,) 1000 MCG/ML injection Inject 1,000 mcg into the muscle every 30 (thirty) days. Done at Dr. Joyce Copa office    Historical Provider, MD  Ertapenem Sodium (INVANZ IV) Inject 500 mg into the vein See admin instructions. Invanz 500 mg in 100 ml NS (provided by [facility] [place], [place]) Allow to warm to room  temperature.  Infuse over 30 minutes via gravity tubing every 24 hours as directed (10 am every morning) Set drip rate at 50 drops per minute (change tubing once every 24 hours) Start date June 18th, stop date July 1st    Historical Provider, MD  lactose free nutrition (BOOST) LIQD Take 237 mLs by mouth 3 (three) times daily between meals.    Historical Provider, MD  sodium chloride 0.9 % injection 10-40 mLs by Intracatheter route as needed (flush). 01/29/14   Ripudeep Jenna Luo, MD    Past Medical History  Diagnosis Date  . Diabetes mellitus without complication   . Chest  pain   . DJD (degenerative joint disease)   . Tachycardia-bradycardia syndrome   . CKD (chronic kidney disease)   . Cardiomegaly   . Coronary artery disease     a. LAD stent 1999 after NSTEMI. b. CABG 2004. c. Last LHC 02/2010 - patent grafts.  . Hypertension   . Hyperlipidemia   . Carotid artery disease     status post right carotid endarterectomy.  . Degenerative joint disease   . Restless leg syndrome   . Chronic renal insufficiency   . Heparin induced thrombocytopenia   . Diphtheria     "as a child"  . Pleural effusion, bilateral 06/2003  . Pseudoaneurysm     status post repair following catherization in 1999  . Chronic systolic heart failure 10/02/11  . Ischemic cardiomyopathy     Echocardiogram 10/01/11: Mild LVH, EF 35-40%, basal inferior, basal to mid posterior and basal to mid anterolateral severe hypokinesis, mild to moderate AI, moderate MR (ischemic MR), moderate LAE, mild RVE, mildly reduced RV systolic function, mild RAE, PASP 43-44  . COPD (chronic obstructive pulmonary disease)   . Atrial fibrillation/flutter     s/p multiple DCCVs;  amiodarone started 09/2011, TEE DCCV 3/13;  amio and coumadin d/c'd after admxn to [facility] 02/2012 with frequent falls/syncope  . Tachycardia-bradycardia syndrome     s/p Medtronic pacemaker implant 09/2011 (8 sec pause noted)  . GERD (gastroesophageal reflux disease)   . Renal artery stenosis     bilateral- status post stenting  . Non-functioning kidney   . Depression     "cries alot"  . Gout   . Hypothyroidism   . Anemia   . Pacemaker     Past Surgical History  Procedure Laterality Date  . Pacemaker insertion    . Carotid endarterectomy  05/2003    right  . Pacemaker insertion  10/02/11    implanted by Dr Johney Frame  . Cholecystectomy  ?02/2011  . Tonsillectomy and adenoidectomy    . Renal artery stent  06/2003    bilaterally/e-chart  . Thoracentesis  ? 2000; 05/2011  . Coronary angioplasty with stent placement  1999  . Av fistula  repair  01/1998    w/pseudoaneurysm repair S/P catheterization/E-chart  . Coronary artery bypass graft  05/2003    CABG X 3  . Tee without cardioversion  10/23/2011    Procedure: TRANSESOPHAGEAL ECHOCARDIOGRAM (TEE);  Surgeon: Wendall Stade, MD;  Location: [facility] ENDOSCOPY;  Service: Cardiovascular;  Laterality: N/A;  . Cardioversion  10/23/2011    Procedure: CARDIOVERSION;  Surgeon: Wendall Stade, MD;  Location: [facility] ENDOSCOPY;  Service: Cardiovascular;  Laterality: N/A;    ROS:  Otherwise as stated in the HPI and negative for all other systems.  PHYSICAL EXAM BP 150/83  Pulse 96  Ht 5\' 4"  (1.626 m)  Wt 106 lb (48.081 kg)  BMI 18.19  kg/m2 GENERAL:   Frail appearing HEENT:  Pupils equal round and reactive, fundi not visualized, oral mucosa unremarkable NECK:  No jugular venous distention, waveform within normal limits, carotid upstroke brisk and symmetric, no bruits, no thyromegaly LYMPHATICS:  No cervical, inguinal adenopathy LUNGS:  Clear to auscultation bilaterally BACK:  No CVA tenderness CHEST:  Unremarkable HEART:  PMI not displaced or sustained,S1 and S2 within normal limits, no S3, no S4, no clicks, no rubs, no murmurs ABD:  Flat, positive bowel sounds normal in frequency in pitch, no bruits, no rebound, no guarding, no midline pulsatile mass, no hepatomegaly, no splenomegaly EXT:  2 plus pulses throughout, no edema, no cyanosis no clubbing SKIN:  No rashes no nodules , bruising NEURO:  Cranial nerves II through XII grossly intact, motor grossly intact throughout PSYCH:  Cognitively intact, oriented to person place and time  EKG:  Atrial flutter, rate 1:15, axis within normal limits, QTC prolonged, diffuse T wave inversions unchanged from previous. 03/10/2014   ASSESSMENT AND PLAN  Failure to thrive - She is doing much better since her hospitalizations earlier this year. No change in therapy is planned.   Cardiomyopathy - She seems to be euvolemic. She will continue the meds  as listed. I reviewed her most recent labs and both recent hospitalizations.  RENAL INSUFFICIENCY -  This was stable recently in the hospital. No change in therapy is indicated. I  Atrial fibrillation with RVR -   Her heart rate is elevated. It looks like flutter on EKG. And then increase her carvedilol 12.5 twice a day and a couple of weeks she'll need a 24-hour Holter monitor to see if we have better rate control.  CAD -  The patient has no new sypmtoms. We have chosen to manage this medically.   ( I reviewed all of her hospital records.)

## 2014-03-10 NOTE — Patient Instructions (Signed)
Please increase Carvedilol to 12.5 mg one tablet twice a day. Continue all other medications as listed.  Your physician has recommended that you wear a holter monitor. Holter monitors are medical devices that record the heart's electrical activity. Doctors most often use these monitors to diagnose arrhythmias. Arrhythmias are problems with the speed or rhythm of the heartbeat. The monitor is a small, portable device. You can wear one while you do your normal daily activities. This is usually used to diagnose what is causing palpitations/syncope (passing out).  Follow up in 3 months with Dr Tamsen Meek in [place].

## 2014-03-24 ENCOUNTER — Encounter: Payer: Self-pay | Admitting: *Deleted

## 2014-03-24 DIAGNOSIS — I4891 Unspecified atrial fibrillation: Secondary | ICD-10-CM

## 2014-03-24 NOTE — Progress Notes (Signed)
Patient ID: [name] [name], female   DOB: 10-14-1924, 80 y.o.   MRN: [id number] E-Cardio 24 hour holter monitor applied to patient.

## 2014-04-17 ENCOUNTER — Encounter (HOSPITAL_COMMUNITY): Payer: Self-pay | Admitting: Emergency Medicine

## 2014-04-17 ENCOUNTER — Emergency Department (HOSPITAL_COMMUNITY): Payer: Medicare Other

## 2014-04-17 ENCOUNTER — Inpatient Hospital Stay (HOSPITAL_COMMUNITY)
Admission: EM | Admit: 2014-04-17 | Discharge: 2014-04-20 | DRG: 291 | Disposition: A | Discharge status: Home Health | Payer: Medicare Other | Attending: Internal Medicine | Admitting: Internal Medicine

## 2014-04-17 DIAGNOSIS — I2510 Atherosclerotic heart disease of native coronary artery without angina pectoris: Secondary | ICD-10-CM | POA: Diagnosis present

## 2014-04-17 DIAGNOSIS — I495 Sick sinus syndrome: Secondary | ICD-10-CM

## 2014-04-17 DIAGNOSIS — R0789 Other chest pain: Secondary | ICD-10-CM

## 2014-04-17 DIAGNOSIS — N390 Urinary tract infection, site not specified: Secondary | ICD-10-CM

## 2014-04-17 DIAGNOSIS — D6489 Other specified anemias: Secondary | ICD-10-CM

## 2014-04-17 DIAGNOSIS — Z79899 Other long term (current) drug therapy: Secondary | ICD-10-CM | POA: Diagnosis not present

## 2014-04-17 DIAGNOSIS — G2581 Restless legs syndrome: Secondary | ICD-10-CM | POA: Diagnosis present

## 2014-04-17 DIAGNOSIS — I480 Paroxysmal atrial fibrillation: Secondary | ICD-10-CM

## 2014-04-17 DIAGNOSIS — E119 Type 2 diabetes mellitus without complications: Secondary | ICD-10-CM | POA: Diagnosis present

## 2014-04-17 DIAGNOSIS — M109 Gout, unspecified: Secondary | ICD-10-CM | POA: Diagnosis present

## 2014-04-17 DIAGNOSIS — J189 Pneumonia, unspecified organism: Secondary | ICD-10-CM | POA: Diagnosis present

## 2014-04-17 DIAGNOSIS — I10 Essential (primary) hypertension: Secondary | ICD-10-CM | POA: Diagnosis present

## 2014-04-17 DIAGNOSIS — F3289 Other specified depressive episodes: Secondary | ICD-10-CM | POA: Diagnosis present

## 2014-04-17 DIAGNOSIS — I129 Hypertensive chronic kidney disease with stage 1 through stage 4 chronic kidney disease, or unspecified chronic kidney disease: Secondary | ICD-10-CM | POA: Diagnosis present

## 2014-04-17 DIAGNOSIS — I509 Heart failure, unspecified: Secondary | ICD-10-CM | POA: Diagnosis present

## 2014-04-17 DIAGNOSIS — J4489 Other specified chronic obstructive pulmonary disease: Secondary | ICD-10-CM | POA: Diagnosis present

## 2014-04-17 DIAGNOSIS — F329 Major depressive disorder, single episode, unspecified: Secondary | ICD-10-CM | POA: Diagnosis present

## 2014-04-17 DIAGNOSIS — Z8249 Family history of ischemic heart disease and other diseases of the circulatory system: Secondary | ICD-10-CM

## 2014-04-17 DIAGNOSIS — E785 Hyperlipidemia, unspecified: Secondary | ICD-10-CM | POA: Diagnosis present

## 2014-04-17 DIAGNOSIS — Z91041 Radiographic dye allergy status: Secondary | ICD-10-CM

## 2014-04-17 DIAGNOSIS — K219 Gastro-esophageal reflux disease without esophagitis: Secondary | ICD-10-CM | POA: Diagnosis present

## 2014-04-17 DIAGNOSIS — J449 Chronic obstructive pulmonary disease, unspecified: Secondary | ICD-10-CM | POA: Diagnosis present

## 2014-04-17 DIAGNOSIS — R079 Chest pain, unspecified: Secondary | ICD-10-CM | POA: Diagnosis present

## 2014-04-17 DIAGNOSIS — N289 Disorder of kidney and ureter, unspecified: Secondary | ICD-10-CM | POA: Diagnosis present

## 2014-04-17 DIAGNOSIS — I5043 Acute on chronic combined systolic (congestive) and diastolic (congestive) heart failure: Principal | ICD-10-CM | POA: Diagnosis present

## 2014-04-17 DIAGNOSIS — Z885 Allergy status to narcotic agent status: Secondary | ICD-10-CM | POA: Diagnosis not present

## 2014-04-17 DIAGNOSIS — I4891 Unspecified atrial fibrillation: Secondary | ICD-10-CM | POA: Diagnosis present

## 2014-04-17 DIAGNOSIS — Z950 Presence of cardiac pacemaker: Secondary | ICD-10-CM | POA: Diagnosis not present

## 2014-04-17 DIAGNOSIS — Z951 Presence of aortocoronary bypass graft: Secondary | ICD-10-CM

## 2014-04-17 DIAGNOSIS — R0602 Shortness of breath: Secondary | ICD-10-CM | POA: Diagnosis not present

## 2014-04-17 DIAGNOSIS — Z9861 Coronary angioplasty status: Secondary | ICD-10-CM | POA: Diagnosis not present

## 2014-04-17 DIAGNOSIS — I2589 Other forms of chronic ischemic heart disease: Secondary | ICD-10-CM | POA: Diagnosis present

## 2014-04-17 DIAGNOSIS — Z66 Do not resuscitate: Secondary | ICD-10-CM | POA: Diagnosis present

## 2014-04-17 DIAGNOSIS — E039 Hypothyroidism, unspecified: Secondary | ICD-10-CM | POA: Diagnosis present

## 2014-04-17 DIAGNOSIS — R4181 Age-related cognitive decline: Secondary | ICD-10-CM | POA: Diagnosis present

## 2014-04-17 DIAGNOSIS — D649 Anemia, unspecified: Secondary | ICD-10-CM | POA: Diagnosis present

## 2014-04-17 DIAGNOSIS — N183 Chronic kidney disease, stage 3 unspecified: Secondary | ICD-10-CM | POA: Diagnosis present

## 2014-04-17 DIAGNOSIS — I252 Old myocardial infarction: Secondary | ICD-10-CM

## 2014-04-17 DIAGNOSIS — J9819 Other pulmonary collapse: Secondary | ICD-10-CM | POA: Diagnosis present

## 2014-04-17 DIAGNOSIS — Z888 Allergy status to other drugs, medicaments and biological substances status: Secondary | ICD-10-CM

## 2014-04-17 DIAGNOSIS — I701 Atherosclerosis of renal artery: Secondary | ICD-10-CM | POA: Diagnosis present

## 2014-04-17 DIAGNOSIS — Z7982 Long term (current) use of aspirin: Secondary | ICD-10-CM | POA: Diagnosis not present

## 2014-04-17 DIAGNOSIS — I5023 Acute on chronic systolic (congestive) heart failure: Secondary | ICD-10-CM | POA: Diagnosis present

## 2014-04-17 HISTORY — DX: Chronic kidney disease, stage 3 unspecified: N18.30

## 2014-04-17 HISTORY — DX: Chronic kidney disease, stage 3 (moderate): N18.3

## 2014-04-17 LAB — COMPREHENSIVE METABOLIC PANEL
ALBUMIN: 3.4 g/dL — AB (ref 3.5–5.2)
ALK PHOS: 66 U/L (ref 39–117)
ALT: 6 U/L (ref 0–35)
AST: 15 U/L (ref 0–37)
Anion gap: 15 (ref 5–15)
BILIRUBIN TOTAL: 0.9 mg/dL (ref 0.3–1.2)
BUN: 24 mg/dL — ABNORMAL HIGH (ref 6–23)
CHLORIDE: 99 meq/L (ref 96–112)
CO2: 24 mEq/L (ref 19–32)
Calcium: 8.9 mg/dL (ref 8.4–10.5)
Creatinine, Ser: 1.47 mg/dL — ABNORMAL HIGH (ref 0.50–1.10)
GFR calc Af Amer: 36 mL/min — ABNORMAL LOW (ref >90)
GFR calc non Af Amer: 31 mL/min — ABNORMAL LOW (ref >90)
Glucose, Bld: 106 mg/dL — ABNORMAL HIGH (ref 70–99)
POTASSIUM: 3.4 meq/L — AB (ref 3.7–5.3)
SODIUM: 138 meq/L (ref 137–147)
Total Protein: 6.5 g/dL (ref 6.0–8.3)

## 2014-04-17 LAB — CBC WITH DIFFERENTIAL/PLATELET
BASOS PCT: 0 % (ref 0–1)
Basophils Absolute: 0 10*3/uL (ref 0.0–0.1)
EOS ABS: 0.1 10*3/uL (ref 0.0–0.7)
Eosinophils Relative: 1 % (ref 0–5)
HCT: 35.1 % — ABNORMAL LOW (ref 36.0–46.0)
Hemoglobin: 11.1 g/dL — ABNORMAL LOW (ref 12.0–15.0)
LYMPHS ABS: 0.8 10*3/uL (ref 0.7–4.0)
Lymphocytes Relative: 16 % (ref 12–46)
MCH: 28.4 pg (ref 26.0–34.0)
MCHC: 31.6 g/dL (ref 30.0–36.0)
MCV: 89.8 fL (ref 78.0–100.0)
Monocytes Absolute: 0.6 10*3/uL (ref 0.1–1.0)
Monocytes Relative: 12 % (ref 3–12)
NEUTROS ABS: 3.5 10*3/uL (ref 1.7–7.7)
Neutrophils Relative %: 71 % (ref 43–77)
PLATELETS: 171 10*3/uL (ref 150–400)
RBC: 3.91 MIL/uL (ref 3.87–5.11)
RDW: 16.5 % — ABNORMAL HIGH (ref 11.5–15.5)
WBC: 5 10*3/uL (ref 4.0–10.5)

## 2014-04-17 LAB — URINALYSIS, ROUTINE W REFLEX MICROSCOPIC
Bilirubin Urine: NEGATIVE
GLUCOSE, UA: NEGATIVE mg/dL
HGB URINE DIPSTICK: NEGATIVE
Ketones, ur: NEGATIVE mg/dL
Leukocytes, UA: NEGATIVE
Nitrite: NEGATIVE
Protein, ur: NEGATIVE mg/dL
Specific Gravity, Urine: 1.015 (ref 1.005–1.030)
Urobilinogen, UA: 1 mg/dL (ref 0.0–1.0)
pH: 6 (ref 5.0–8.0)

## 2014-04-17 LAB — MRSA PCR SCREENING: MRSA by PCR: NEGATIVE

## 2014-04-17 LAB — PRO B NATRIURETIC PEPTIDE: Pro B Natriuretic peptide (BNP): 30005 pg/mL — ABNORMAL HIGH (ref 0–450)

## 2014-04-17 LAB — TROPONIN I
Troponin I: <0.3 ng/mL (ref <0.30)
Troponin I: <0.3 ng/mL (ref <0.30)
Troponin I: <0.3 ng/mL (ref <0.30)

## 2014-04-17 LAB — TSH: TSH: 1.44 u[IU]/mL (ref 0.350–4.500)

## 2014-04-17 MED ORDER — AMIODARONE HCL 100 MG PO TABS
100.0000 mg | ORAL_TABLET | Freq: Every day | ORAL | Status: DC
  Administered 2014-04-17 – 2014-04-20 (×4): 100 mg via ORAL
  Filled 2014-04-17 (×4): qty 1

## 2014-04-17 MED ORDER — SODIUM CHLORIDE 0.9 % IJ SOLN
3.0000 mL | Freq: Two times a day (BID) | INTRAMUSCULAR | Status: DC
  Administered 2014-04-17 – 2014-04-19 (×6): 3 mL via INTRAVENOUS

## 2014-04-17 MED ORDER — NITROGLYCERIN 2 % TD OINT
0.5000 [in_us] | TOPICAL_OINTMENT | Freq: Once | TRANSDERMAL | Status: AC
  Administered 2014-04-17: 0.5 [in_us] via TOPICAL
  Filled 2014-04-17: qty 1

## 2014-04-17 MED ORDER — ASPIRIN EC 81 MG PO TBEC
81.0000 mg | DELAYED_RELEASE_TABLET | Freq: Every day | ORAL | Status: DC
  Administered 2014-04-17 – 2014-04-20 (×4): 81 mg via ORAL
  Filled 2014-04-17 (×4): qty 1

## 2014-04-17 MED ORDER — ONDANSETRON HCL 4 MG/2ML IJ SOLN: 4.0000 mg | Freq: Four times a day (QID) | INTRAMUSCULAR | Status: DC | PRN

## 2014-04-17 MED ORDER — CYANOCOBALAMIN 1000 MCG/ML IJ SOLN: 1000.0000 ug | INTRAMUSCULAR | Status: DC

## 2014-04-17 MED ORDER — CETYLPYRIDINIUM CHLORIDE 0.05 % MT LIQD
7.0000 mL | Freq: Two times a day (BID) | OROMUCOSAL | Status: DC
  Administered 2014-04-17 – 2014-04-19 (×6): 7 mL via OROMUCOSAL

## 2014-04-17 MED ORDER — CHOLECALCIFEROL 25 MCG (1000 UT) PO CAPS
1000.0000 [IU] | ORAL_CAPSULE | Freq: Every day | ORAL | Status: DC
  Administered 2014-04-17 – 2014-04-19 (×3): 1000 [IU] via ORAL
  Filled 2014-04-17 (×4): qty 1

## 2014-04-17 MED ORDER — VANCOMYCIN HCL 500 MG IV SOLR
500.0000 mg | INTRAVENOUS | Status: DC
  Administered 2014-04-18 – 2014-04-19 (×2): 500 mg via INTRAVENOUS
  Filled 2014-04-17 (×3): qty 500

## 2014-04-17 MED ORDER — FUROSEMIDE 10 MG/ML IJ SOLN
40.0000 mg | Freq: Two times a day (BID) | INTRAMUSCULAR | Status: DC
  Filled 2014-04-17: qty 4

## 2014-04-17 MED ORDER — SODIUM CHLORIDE 0.9 % IV SOLN: 250.0000 mL | INTRAVENOUS | Status: DC | PRN

## 2014-04-17 MED ORDER — POTASSIUM CHLORIDE CRYS ER 20 MEQ PO TBCR
20.0000 meq | EXTENDED_RELEASE_TABLET | Freq: Two times a day (BID) | ORAL | Status: AC
  Administered 2014-04-17 – 2014-04-18 (×4): 20 meq via ORAL
  Filled 2014-04-17 (×4): qty 1

## 2014-04-17 MED ORDER — HYDROCODONE-ACETAMINOPHEN 10-325 MG PO TABS
1.0000 | ORAL_TABLET | Freq: Four times a day (QID) | ORAL | Status: DC | PRN
  Administered 2014-04-17 – 2014-04-20 (×9): 1 via ORAL
  Filled 2014-04-17 (×9): qty 1

## 2014-04-17 MED ORDER — SODIUM CHLORIDE 0.9 % IJ SOLN
3.0000 mL | INTRAMUSCULAR | Status: DC | PRN
Start: 2014-04-17 — End: 2014-04-20

## 2014-04-17 MED ORDER — BOOST PO LIQD
237.0000 mL | Freq: Three times a day (TID) | ORAL | Status: DC
  Administered 2014-04-17 – 2014-04-20 (×9): 237 mL via ORAL
  Filled 2014-04-17 (×14): qty 237

## 2014-04-17 MED ORDER — VANCOMYCIN HCL IN DEXTROSE 750-5 MG/150ML-% IV SOLN
750.0000 mg | Freq: Once | INTRAVENOUS | Status: AC
  Administered 2014-04-17: 750 mg via INTRAVENOUS
  Filled 2014-04-17: qty 150

## 2014-04-17 MED ORDER — FUROSEMIDE 10 MG/ML IJ SOLN: 60.0000 mg | Freq: Two times a day (BID) | INTRAMUSCULAR | Status: DC

## 2014-04-17 MED ORDER — TAMSULOSIN HCL 0.4 MG PO CAPS
0.4000 mg | ORAL_CAPSULE | Freq: Every day | ORAL | Status: DC
  Administered 2014-04-17 – 2014-04-20 (×4): 0.4 mg via ORAL
  Filled 2014-04-17 (×4): qty 1

## 2014-04-17 MED ORDER — FUROSEMIDE 10 MG/ML IJ SOLN
60.0000 mg | Freq: Once | INTRAMUSCULAR | Status: AC
  Administered 2014-04-17: 60 mg via INTRAVENOUS
  Filled 2014-04-17: qty 6

## 2014-04-17 MED ORDER — CARVEDILOL 12.5 MG PO TABS
12.5000 mg | ORAL_TABLET | Freq: Two times a day (BID) | ORAL | Status: DC
  Administered 2014-04-17 – 2014-04-20 (×7): 12.5 mg via ORAL
  Filled 2014-04-17 (×9): qty 1

## 2014-04-17 MED ORDER — DOCUSATE SODIUM 100 MG PO CAPS
200.0000 mg | ORAL_CAPSULE | Freq: Two times a day (BID) | ORAL | Status: DC
  Administered 2014-04-17 – 2014-04-20 (×7): 200 mg via ORAL
  Filled 2014-04-17 (×9): qty 2

## 2014-04-17 MED ORDER — DEXTROSE 5 % IV SOLN
500.0000 mg | Freq: Every day | INTRAVENOUS | Status: DC
  Administered 2014-04-17 – 2014-04-19 (×3): 500 mg via INTRAVENOUS
  Filled 2014-04-17 (×5): qty 0.5

## 2014-04-17 MED ORDER — FUROSEMIDE 10 MG/ML IJ SOLN
40.0000 mg | Freq: Two times a day (BID) | INTRAMUSCULAR | Status: DC
  Administered 2014-04-17 – 2014-04-18 (×4): 40 mg via INTRAVENOUS
  Filled 2014-04-17 (×5): qty 4

## 2014-04-17 MED ORDER — LEVOTHYROXINE SODIUM 88 MCG PO TABS
88.0000 ug | ORAL_TABLET | Freq: Every day | ORAL | Status: DC
  Administered 2014-04-17 – 2014-04-20 (×4): 88 ug via ORAL
  Filled 2014-04-17 (×5): qty 1

## 2014-04-17 MED ORDER — ACETAMINOPHEN 325 MG PO TABS: 650.0000 mg | ORAL_TABLET | ORAL | Status: DC | PRN

## 2014-04-17 MED ORDER — HYDROCODONE-ACETAMINOPHEN 10-325 MG PO TABS
1.0000 | ORAL_TABLET | Freq: Four times a day (QID) | ORAL | Status: DC
  Administered 2014-04-17: 1 via ORAL
  Filled 2014-04-17: qty 1

## 2014-04-17 MED ORDER — PANTOPRAZOLE SODIUM 40 MG PO TBEC
40.0000 mg | DELAYED_RELEASE_TABLET | Freq: Every day | ORAL | Status: DC
  Administered 2014-04-17 – 2014-04-20 (×4): 40 mg via ORAL
  Filled 2014-04-17 (×4): qty 1

## 2014-04-17 MED ORDER — ASPIRIN EC 325 MG PO TBEC
325.0000 mg | DELAYED_RELEASE_TABLET | Freq: Every day | ORAL | Status: DC
Start: 2014-04-17 — End: 2014-04-17

## 2014-04-17 MED ORDER — ASPIRIN 81 MG PO CHEW
324.0000 mg | CHEWABLE_TABLET | Freq: Once | ORAL | Status: AC
  Administered 2014-04-17: 324 mg via ORAL
  Filled 2014-04-17: qty 4

## 2014-04-17 NOTE — ED Notes (Signed)
PER EMS: pt from home, reports SOB and CP that started this morning around 0400. She also reports left ankle swelling. Pacemaker. Hx of Afib. A&OX4.

## 2014-04-17 NOTE — Progress Notes (Signed)
ANTIBIOTIC CONSULT NOTE - INITIAL  Pharmacy Consult for vancomycin and cefepime Indication: pneumonia  Allergies  Allergen Reactions  . Heparin Other (See Comments)    Clots blood instead of thinning  . Hydromorphone Hcl Other (See Comments)    Crazy feeling  . Warfarin And Related Other (See Comments)    Syncope events  . Altace [Ramipril] Hives  . Contrast Media [Iodinated Diagnostic Agents] Other (See Comments)    Reaction noted by md- might have caused aneurism in 1999  . Morphine And Related Nausea And Vomiting  . Plavix [Clopidogrel Bisulfate] Hives and Itching    Patient Measurements: Height:  (162.6 cm) Weight: 108 lb 3.9 oz (49.1 kg) IBW/kg (Calculated) : 54.7   Vital Signs: Temp: 97.6 F (36.4 C) (09/05 0820) Temp src: Oral (09/05 0820) BP: 167/89 mmHg (09/05 0820) Pulse Rate: 70 (09/05 0820) Intake/Output from previous day:   Intake/Output from this shift:    Labs:  Recent Labs  04/17/14 0447  WBC 5.0  HGB 11.1*  PLT 171  CREATININE 1.47*   Estimated Creatinine Clearance: 21.3 ml/min (by C-G formula based on Cr of 1.47). No results found for this basename: VANCOTROUGH, VANCOPEAK, VANCORANDOM, GENTTROUGH, GENTPEAK, GENTRANDOM, TOBRATROUGH, TOBRAPEAK, TOBRARND, AMIKACINPEAK, AMIKACINTROU, AMIKACIN,  in the last 72 hours   Microbiology: No results found for this or any previous visit (from the past 720 hour(s)).  Medical History: Past Medical History  Diagnosis Date  . Diabetes mellitus without complication   . Chest pain   . DJD (degenerative joint disease)   . Tachycardia-bradycardia syndrome   . CKD (chronic kidney disease)   . Cardiomegaly   . Coronary artery disease     a. LAD stent 1999 after NSTEMI. b. CABG 2004. c. Last LHC 02/2010 - patent grafts.  . Hypertension   . Hyperlipidemia   . Carotid artery disease     status post right carotid endarterectomy.  . Degenerative joint disease   . Restless leg syndrome   . Chronic renal  insufficiency   . Heparin induced thrombocytopenia   . Diphtheria     "as a child"  . Pleural effusion, bilateral 06/2003  . Pseudoaneurysm     status post repair following catherization in 1999  . Chronic systolic heart failure 10/02/11  . Ischemic cardiomyopathy     Echocardiogram 10/01/11: Mild LVH, EF 35-40%, basal inferior, basal to mid posterior and basal to mid anterolateral severe hypokinesis, mild to moderate AI, moderate MR (ischemic MR), moderate LAE, mild RVE, mildly reduced RV systolic function, mild RAE, PASP 43-44  . COPD (chronic obstructive pulmonary disease)   . Atrial fibrillation/flutter     s/p multiple DCCVs;  amiodarone started 09/2011, TEE DCCV 3/13;  amio and coumadin d/c'd after admxn to [facility] 02/2012 with frequent falls/syncope  . Tachycardia-bradycardia syndrome     s/p Medtronic pacemaker implant 09/2011 (8 sec pause noted)  . GERD (gastroesophageal reflux disease)   . Renal artery stenosis     bilateral- status post stenting  . Non-functioning kidney   . Depression     "cries alot"  . Gout   . Hypothyroidism   . Anemia   . Pacemaker    Assessment: 80 year old female admitted to [facility] with worsening SOB and intermittent chest pain. Possible HCAP, no fevers, wbc normal, new orders to start broad antibiotics with vancomycin and cefepime. Renal function appears to be close to baseline ~1.4.  Goal of Therapy:  Vancomycin trough level 15-20 mcg/ml  Plan:  Measure  antibiotic drug levels at steady state Follow up culture results Cefepime 500mg  q24 hours Vancomycin 500mg  IV q24 hours  Sheppard Coil PharmD., BCPS Clinical Pharmacist Pager [phone] 04/17/2014 9:45 AM

## 2014-04-17 NOTE — Progress Notes (Signed)
[facility] PROGRESS NOTE  [name] [name] LTR:[id number] DOB: 1924/10/12 DOA: 04/17/2014 PCP: Josue Hector, MD  Assessment/Plan: 1-Acute on chronic systolic and diastolic CHF (congestive heart failure, Last EF 35 %  Will change lasix to 40 Mg IV BID. Continue with coreg.  ECHO ordered.  Cardiology consulted.  Daily weight. Strict I and O.   PNA, Health care associated.  Reports chills, cough, chest x ray with linear atelectasis.  Check blood culture, sputum culture.  Start Vancomycin and cefepime.  Repeat chest x ray tomorrow.   Chest pain: cycle enzymes. Fist troponin negative. PRN nitroglycerin. Coreg, aspirin,. Check lipids panel.   Atrial fibrillation On amiodarone, and Carvedilol for Rate and Rhythm Control  RENAL ARTERY STENOSIS  No ACe Inhibitors  CKD stage 3, GFR 30-59 ml/min  Monitor BUN/Cr  Hypothyroidism  Continue Levothyroxine   Code Status: DNR Family Communication: Care discussed with patient.  Disposition Plan: Remain inpatient.    Consultants:  Cardiology  Procedures:  ECHO.   Antibiotics:  Vancomycin 9-5  Cefepime 9-5  HPI/Subjective: Feeling weak and tired. Her regular weight is 107 pounds.  Breathing better.   Objective: Filed Vitals:   04/17/14 0820  BP: 167/89  Pulse: 70  Temp: 97.6 F (36.4 C)  Resp: 22   No intake or output data in the 24 hours ending 04/17/14 0934 Filed Weights   04/17/14 0900  Weight: 49.1 kg (108 lb 3.9 oz)    Exam:   General:  Alert, in no distress.   Cardiovascular: S 1, S 2 IRR  Respiratory: Decrease breath sounds, crackles bases.   Abdomen: BS present, soft, NT  Musculoskeletal: no edema.   Data Reviewed: Basic Metabolic Panel:  Recent Labs Lab 04/17/14 0447  NA 138  K 3.4*  CL 99  CO2 24  GLUCOSE 106*  BUN 24*  CREATININE 1.47*  CALCIUM 8.9   Liver Function Tests:  Recent Labs Lab 04/17/14 0447  AST 15  ALT 6  ALKPHOS 66  BILITOT 0.9  PROT 6.5   ALBUMIN 3.4*   No results found for this basename: LIPASE, AMYLASE,  in the last 168 hours No results found for this basename: AMMONIA,  in the last 168 hours CBC:  Recent Labs Lab 04/17/14 0447  WBC 5.0  NEUTROABS 3.5  HGB 11.1*  HCT 35.1*  MCV 89.8  PLT 171   Cardiac Enzymes:  Recent Labs Lab 04/17/14 0447  TROPONINI <0.30   BNP (last 3 results)  Recent Labs  01/26/14 1030 04/17/14 0447  PROBNP 9027.0* 30005.0*   CBG: No results found for this basename: GLUCAP,  in the last 168 hours  No results found for this or any previous visit (from the past 240 hour(s)).   Studies: Dg Chest Port 1 View  04/17/2014   CLINICAL DATA:  Shortness of breath  EXAM: PORTABLE CHEST - 1 VIEW  COMPARISON:  02/07/2014  FINDINGS: Cardiac pacemaker. Postoperative change in the mediastinum. Shallow inspiration with elevation of right hemidiaphragm. Interval development of linear atelectasis in the right mid lung with small right pleural effusion. Probable emphysematous changes in the lungs. Calcified and tortuous aorta. Degenerative changes in the spine and shoulders.  IMPRESSION: Developing linear atelectasis in the right mid lung and small right pleural effusion.   Electronically Signed   By: Burman Nieves M.D.   On: 04/17/2014 05:24    Scheduled Meds: . amiodarone  100 mg Oral Daily  . aspirin EC  81 mg Oral Daily  . carvedilol  12.5  mg Oral BID WC  . Cholecalciferol  1,000 Units Oral Daily  . cyanocobalamin  1,000 mcg Intramuscular Q30 days  . docusate sodium  200 mg Oral BID  . furosemide  60 mg Intravenous BID  . lactose free nutrition  237 mL Oral TID BM  . levothyroxine  88 mcg Oral QAC breakfast  . pantoprazole  40 mg Oral Daily  . potassium chloride  20 mEq Oral BID  . sodium chloride  3 mL Intravenous Q12H  . tamsulosin  0.4 mg Oral Daily   Continuous Infusions:   Principal Problem:   Acute on chronic systolic CHF (congestive heart failure) Active Problems:    HYPERLIPIDEMIA   HYPERTENSION   CAD- CABG in '04, cath 7/11 [place]   RENAL ARTERY STENOSIS   CKD stage 3, GFR 30-59 ml/min   Hypothyroidism   Chest pain   Renal insufficiency   Anemia    Time spent: 35 minutes.     Hartley Barefoot A  [facility] Pager [phone]. If 7PM-7AM, please contact night-coverage at [email or web address], password [facility] 04/17/2014, 9:34 AM  LOS: 0 days

## 2014-04-17 NOTE — Consult Note (Signed)
CARDIOLOGY CONSULT NOTE  Patient ID: [name] [name], MRN: [id number], DOB/AGE: 12-14-1924 80 y.o. Admit date: 04/17/2014 Date of Consult: 04/17/2014  Primary Physician: Josue Hector, MD Primary Cardiologist: [facility]  Chief Complaint: [name] [name]   HPI [name] [name] is a 80 y.o. female  Admitted with shortness of breath of 4 days duration  She is a history of coronary artery disease with prior bypass surgery 2004; left heart catheterization 2011 demonstrated patent grafts. Ejection fraction 2/13 35-40%. Described in Dr. [facility] as class IV  She has a history of atrial fibrillation-paroxysmal for which she takes amiodarone to beta blockers. She does not take anticoagulation because of a history of fall  She was hospitalized in 2014 with bacteremia.  Past Medical History  Diagnosis Date  . Diabetes mellitus without complication   . Chest pain   . DJD (degenerative joint disease)   . Tachycardia-bradycardia syndrome   . CKD (chronic kidney disease)   . Cardiomegaly   . Coronary artery disease     a. LAD stent 1999 after NSTEMI. b. CABG 2004. c. Last LHC 02/2010 - patent grafts.  . Hypertension   . Hyperlipidemia   . Carotid artery disease     status post right carotid endarterectomy.  . Degenerative joint disease   . Restless leg syndrome   . Chronic renal insufficiency   . Heparin induced thrombocytopenia   . Diphtheria     "as a child"  . Pleural effusion, bilateral 06/2003  . Pseudoaneurysm     status post repair following catherization in 1999  . Chronic systolic heart failure 10/02/11  . Ischemic cardiomyopathy     Echocardiogram 10/01/11: Mild LVH, EF 35-40%, basal inferior, basal to mid posterior and basal to mid anterolateral severe hypokinesis, mild to moderate AI, moderate MR (ischemic MR), moderate LAE, mild RVE, mildly reduced RV systolic function, mild RAE, PASP 43-44  . COPD (chronic obstructive pulmonary disease)   . Atrial fibrillation/flutter     s/p multiple  DCCVs;  amiodarone started 09/2011, TEE DCCV 3/13;  amio and coumadin d/c'd after admxn to [facility] 02/2012 with frequent falls/syncope  . Tachycardia-bradycardia syndrome     s/p Medtronic pacemaker implant 09/2011 (8 sec pause noted)  . GERD (gastroesophageal reflux disease)   . Renal artery stenosis     bilateral- status post stenting  . Non-functioning kidney   . Depression     "cries alot"  . Gout   . Hypothyroidism   . Anemia   . Pacemaker       Surgical History:  Past Surgical History  Procedure Laterality Date  . Pacemaker insertion    . Carotid endarterectomy  05/2003    right  . Pacemaker insertion  10/02/11    implanted by Dr Johney Frame  . Cholecystectomy  ?02/2011  . Tonsillectomy and adenoidectomy    . Renal artery stent  06/2003    bilaterally/e-chart  . Thoracentesis  ? 2000; 05/2011  . Coronary angioplasty with stent placement  1999  . Av fistula repair  01/1998    w/pseudoaneurysm repair S/P catheterization/E-chart  . Coronary artery bypass graft  05/2003    CABG X 3  . Tee without cardioversion  10/23/2011    Procedure: TRANSESOPHAGEAL ECHOCARDIOGRAM (TEE);  Surgeon: Wendall Stade, MD;  Location[facility] ENDOSCOPY;  Service: Cardiovascular;  Laterality: N/A;  . Cardioversion  10/23/2011    Procedure: CARDIOVERSION;  Surgeon: Wendall Stade, MD;  Location: [facility] ENDOSCOPY;  Service: Cardiovascular;  Laterality: N/A;  Home Meds: Prior to Admission medications   Medication Sig Start Date End Date Taking? Authorizing Provider  amiodarone (PACERONE) 200 MG tablet Take 100 mg by mouth daily.   Yes Historical Provider, MD  aspirin EC 81 MG tablet Take 81 mg by mouth daily.   Yes Historical Provider, MD  carvedilol (COREG) 12.5 MG tablet Take 1 tablet (12.5 mg total) by mouth 2 (two) times daily with a meal. 03/10/14  Yes Rollene Rotunda, MD  cyanocobalamin (,VITAMIN B-12,) 1000 MCG/ML injection Inject 1,000 mcg into the muscle every 30 (thirty) days. Done at Dr. Joyce Copa office   Yes  Historical Provider, MD  docusate sodium (COLACE) 100 MG capsule Take 100 mg by mouth daily.    Yes Historical Provider, MD  furosemide (LASIX) 40 MG tablet Take 40 mg by mouth daily.  02/06/14  Yes Esperanza Sheets, MD  HYDROcodone-acetaminophen (NORCO) 10-325 MG per tablet Take 1 tablet by mouth every 6 (six) hours. scheduled 02/08/14  Yes Esperanza Sheets, MD  isosorbide mononitrate (IMDUR) 15 mg TB24 24 hr tablet Take 0.5 tablets (15 mg total) by mouth daily. 02/06/14  Yes Esperanza Sheets, MD  lactose free nutrition (BOOST) LIQD Take 237 mLs by mouth 3 (three) times daily between meals.   Yes Historical Provider, MD  levothyroxine (SYNTHROID, LEVOTHROID) 88 MCG tablet Take 88 mcg by mouth daily before breakfast.   Yes Historical Provider, MD  LORazepam (ATIVAN) 0.5 MG tablet Take 0.5 mg by mouth daily.   Yes Historical Provider, MD  omeprazole (PRILOSEC) 40 MG capsule Take 1 capsule (40 mg total) by mouth daily. 11/27/12  Yes Rhonda G Barrett, PA-C  potassium chloride (K-DUR,KLOR-CON) 10 MEQ tablet Take 10 mEq by mouth daily.   Yes Historical Provider, MD  Tamsulosin HCl (FLOMAX) 0.4 MG CAPS Take 1 capsule (0.4 mg total) by mouth daily. 08/29/12  Yes Dayna N Dunn, PA-C  nitroGLYCERIN (NITROSTAT) 0.4 MG SL tablet Place 0.4 mg under the tongue every 5 (five) minutes as needed for chest pain.    Historical Provider, MD    Inpatient Medications:  . amiodarone  100 mg Oral Daily  . antiseptic oral rinse  7 mL Mouth Rinse BID  . aspirin EC  81 mg Oral Daily  . carvedilol  12.5 mg Oral BID WC  . ceFEPime (MAXIPIME) IV  500 mg Intravenous Daily  . Cholecalciferol  1,000 Units Oral Daily  . docusate sodium  200 mg Oral BID  . furosemide  40 mg Intravenous Q12H  . lactose free nutrition  237 mL Oral TID BM  . levothyroxine  88 mcg Oral QAC breakfast  . pantoprazole  40 mg Oral Daily  . potassium chloride  20 mEq Oral BID  . sodium chloride  3 mL Intravenous Q12H  . tamsulosin  0.4 mg Oral Daily  .  [START ON 04/18/2014] vancomycin  500 mg Intravenous Q24H    Allergies:  Allergies  Allergen Reactions  . Heparin Other (See Comments)    Clots blood instead of thinning  . Hydromorphone Hcl Other (See Comments)    Crazy feeling  . Warfarin And Related Other (See Comments)    Syncope events  . Altace [Ramipril] Hives  . Contrast Media [Iodinated Diagnostic Agents] Other (See Comments)    Reaction noted by md- might have caused aneurism in 1999  . Morphine And Related Nausea And Vomiting  . Plavix [Clopidogrel Bisulfate] Hives and Itching    History   Social History  . Marital Status:  Widowed    Spouse Name: N/A    Number of Children: N/A  . Years of Education: N/A   Occupational History  . retired Health and safety inspector    Social History Main Topics  . Smoking status: Never Smoker   . Smokeless tobacco: Never Used  . Alcohol Use: No  . Drug Use: No  . Sexual Activity: No   Other Topics Concern  . Not on file   Social History Narrative   ** Merged History Encounter **         Family History  Problem Relation Age of Onset  . Cancer Mother 89    died  . Lung disease Father 26    died  . Heart disease Sister     2 sisters and her son  . Anesthesia problems Neg Hx      ROS:  Please see the history of present illness.     All other systems reviewed and negative.    Physical Exam:   Blood pressure 133/63, pulse 70, temperature 97.3 F (36.3 C), temperature source Oral, resp. rate 19, height  (1.626 m), weight 108 lb 3.9 oz (49.1 kg), SpO2 94.00%. General: Well developed, well nourished female in no acute distress. Head: Normocephalic, atraumatic, sclera non-icteric, no xanthomas, nares are without discharge. EENT: normal  Lymph Nodes:  none Neck: Negative for carotid bruits. JVD >10 Back:without scoliosis kyphosis Lungs: Decreased breath sounds right base Breathing is unlabored. Heart: RRR with S1 S2.  3/6 systolic murmur . No rubs, or gallops  appreciated. Abdomen: Soft, non-tender, non-distended with normoactive bowel sounds. No hepatomegaly. No rebound/guarding. No obvious abdominal masses. Msk:  Strength and tone appear normal for age. Extremities: No clubbing or cyanosis.  1+ edema.  Distal pedal pulses are 2+ and equal bilaterally. Skin: Warm and Dry Neuro: Alert and oriented X 3. CN III-XII intact Grossly normal sensory and motor function . Psych:  Responds to questions appropriately with a normal affect.      Labs: Cardiac Enzymes  Recent Labs  04/17/14 0447 04/17/14 0902 04/17/14 1420  TROPONINI <0.30 <0.30 <0.30   CBC Lab Results  Component Value Date   WBC 5.0 04/17/2014   HGB 11.1* 04/17/2014   HCT 35.1* 04/17/2014   MCV 89.8 04/17/2014   PLT 171 04/17/2014   PROTIME: No results found for this basename: LABPROT, INR,  in the last 72 hours Chemistry  Recent Labs Lab 04/17/14 0447  NA 138  K 3.4*  CL 99  CO2 24  BUN 24*  CREATININE 1.47*  CALCIUM 8.9  PROT 6.5  BILITOT 0.9  ALKPHOS 66  ALT 6  AST 15  GLUCOSE 106*   Lipids Lab Results  Component Value Date   CHOL 113 02/05/2014   HDL 30* 02/05/2014   LDLCALC 59 02/05/2014   TRIG 119 02/05/2014   BNP Pro B Natriuretic peptide (BNP)  Date/Time Value Ref Range Status  04/17/2014  4:47 AM 30005.0* 0 - 450 pg/mL Final  01/26/2014 10:30 AM 9027.0* 0 - 450 pg/mL Final  11/23/2012  3:16 PM 6161.0* 0 - 450 pg/mL Final  09/04/2012  6:10 AM 7008.0* 0 - 450 pg/mL Final   Miscellaneous Lab Results  Component Value Date   DDIMER 1.77* 02/19/2012    Radiology/Studies:  Dg Chest Port 1 View  04/17/2014   CLINICAL DATA:  Shortness of breath  EXAM: PORTABLE CHEST - 1 VIEW  COMPARISON:  02/07/2014  FINDINGS: Cardiac pacemaker. Postoperative change in the mediastinum. Shallow inspiration with  elevation of right hemidiaphragm. Interval development of linear atelectasis in the right mid lung with small right pleural effusion. Probable emphysematous changes in the  lungs. Calcified and tortuous aorta. Degenerative changes in the spine and shoulders.  IMPRESSION: Developing linear atelectasis in the right mid lung and small right pleural effusion.   Electronically Signed   By: Burman Nieves M.D.   On: 04/17/2014 05:24      EKG: Apaced 70 22/13/49 IMI  Tele AV paced  Device interrogation demonstrated no atrial fibrillation in the last month  Assessment and Plan:  Congestive heart failure-acute on chronic systolic  Ischemic cardiomyopathy  Sinus node dysfunction  Renal insufficiency grade 3  Pacemaker Medtronic    The patient presents with acute on chronic systolic heart failure without clear cause. No evidence of arrhythmia or ischemia. She had noted increasing swelling. She is much improved already today.  There is no evidence of significant right-sided volume overload. She has modest prerenal azotemia  Is also some question as to whether she had pneumonia And she has been started on antibiotics.  Would continue diuresis. We'll await echocardiogram.    Sherryl Manges

## 2014-04-17 NOTE — H&P (Signed)
[facility] Admission History and Physical       [name] [name] EBR:[id number] DOB: [birth year] DOA: 04/17/2014  Referring physician:  EDP PCP: Josue Hector, MD  Specialists:   Chief Complaint: SOB  HPI: [name] [name] is a 80 y.o. female with Multiple medical Problems who presents to the ED with complaints of worsening SOB since 4 AM.  She has had Dysnea, and DOE, and Orthopnea.  She reports having chest pain off and on x 2 weeks. In the ED she was found to have a BNP of 30K, and her baseline BNP runs around 6k.  She was administered 60 mg of IV lasix x 1 and referred for medical admission.  Her initial cardiac workup was negative.      Review of Systems:  Constitutional: No Weight Loss, No Weight Gain, Night Sweats, Fevers, Chills, Dizziness, Fatigue, or Generalized Weakness HEENT: No Headaches, Difficulty Swallowing,Tooth/Dental Problems,Sore Throat,  No Sneezing, Rhinitis, Ear Ache, Nasal Congestion, or Post Nasal Drip,  Cardio-vascular:  +Chest pain, +Orthopnea, PND, Edema in Lower Extremities, Anasarca, Dizziness, Palpitations  Resp: +Dyspnea, No +DOE, No Cough, No Hemoptysis, No Wheezing.    GI: No Heartburn, Indigestion, Abdominal Pain, Nausea, Vomiting, Diarrhea, Hematemesis, Hematochezia, Melena, Change in Bowel Habits,  Loss of Appetite  GU: No Dysuria, Change in Color of Urine, No Urgency or Frequency, No Flank pain.  Musculoskeletal: +Generalized Pain or Swelling, No Decreased Range of Motion, No Back Pain.  Neurologic: No Syncope, No Seizures, Muscle Weakness, Paresthesia, Vision Disturbance or Loss, No Diplopia, No Vertigo, No Difficulty Walking,  Skin: No Rash or Lesions. Psych: No Change in Mood or Affect, No Depression or Anxiety, No Memory loss, No Confusion, or Hallucinations   Past Medical History  Diagnosis Date  . Diabetes mellitus without complication   . Chest pain   . DJD (degenerative joint disease)   . Tachycardia-bradycardia syndrome     . CKD (chronic kidney disease)   . Cardiomegaly   . Coronary artery disease     a. LAD stent 1999 after NSTEMI. b. CABG 2004. c. Last LHC 02/2010 - patent grafts.  . Hypertension   . Hyperlipidemia   . Carotid artery disease     status post right carotid endarterectomy.  . Degenerative joint disease   . Restless leg syndrome   . Chronic renal insufficiency   . Heparin induced thrombocytopenia   . Diphtheria     "as a child"  . Pleural effusion, bilateral 06/2003  . Pseudoaneurysm     status post repair following catherization in 1999  . Chronic systolic heart failure 10/02/11  . Ischemic cardiomyopathy     Echocardiogram 10/01/11: Mild LVH, EF 35-40%, basal inferior, basal to mid posterior and basal to mid anterolateral severe hypokinesis, mild to moderate AI, moderate MR (ischemic MR), moderate LAE, mild RVE, mildly reduced RV systolic function, mild RAE, PASP 43-44  . COPD (chronic obstructive pulmonary disease)   . Atrial fibrillation/flutter     s/p multiple DCCVs;  amiodarone started 09/2011, TEE DCCV 3/13;  amio and coumadin d/c'd after admxn to [facility] 02/2012 with frequent falls/syncope  . Tachycardia-bradycardia syndrome     s/p Medtronic pacemaker implant 09/2011 (8 sec pause noted)  . GERD (gastroesophageal reflux disease)   . Renal artery stenosis     bilateral- status post stenting  . Non-functioning kidney   . Depression     "cries alot"  . Gout   . Hypothyroidism   . Anemia   . Pacemaker  Past Surgical History  Procedure Laterality Date  . Pacemaker insertion    . Carotid endarterectomy  05/2003    right  . Pacemaker insertion  10/02/11    implanted by Dr Johney Frame  . Cholecystectomy  ?02/2011  . Tonsillectomy and adenoidectomy    . Renal artery stent  06/2003    bilaterally/e-chart  . Thoracentesis  ? 2000; 05/2011  . Coronary angioplasty with stent placement  1999  . Av fistula repair  01/1998    w/pseudoaneurysm repair S/P catheterization/E-chart  . Coronary  artery bypass graft  05/2003    CABG X 3  . Tee without cardioversion  10/23/2011    Procedure: TRANSESOPHAGEAL ECHOCARDIOGRAM (TEE);  Surgeon: Wendall Stade, MD;  Location: [facility] ENDOSCOPY;  Service: Cardiovascular;  Laterality: N/A;  . Cardioversion  10/23/2011    Procedure: CARDIOVERSION;  Surgeon: Wendall Stade, MD;  Location: [facility] ENDOSCOPY;  Service: Cardiovascular;  Laterality: N/A;     Prior to Admission medications   Medication Sig Start Date End Date Taking? Authorizing Provider  amiodarone (PACERONE) 200 MG tablet Take 100 mg by mouth daily.    Historical Provider, MD  aspirin EC 81 MG tablet Take 81 mg by mouth daily.    Historical Provider, MD  carvedilol (COREG) 12.5 MG tablet Take 1 tablet (12.5 mg total) by mouth 2 (two) times daily with a meal. 03/10/14   Rollene Rotunda, MD  Cholecalciferol 1000 UNITS capsule Take 1,000 Units by mouth daily.    Historical Provider, MD  cyanocobalamin (,VITAMIN B-12,) 1000 MCG/ML injection Inject 1,000 mcg into the muscle every 30 (thirty) days. Done at Dr. Joyce Copa office    Historical Provider, MD  docusate sodium (COLACE) 100 MG capsule Take 200 mg by mouth 2 (two) times daily.     Historical Provider, MD  Ertapenem Sodium (INVANZ IV) Inject 500 mg into the vein See admin instructions. Invanz 500 mg in 100 ml NS (provided by [facility], [place], [place]) Allow to warm to room temperature.  Infuse over 30 minutes via gravity tubing every 24 hours as directed (10 am every morning) Set drip rate at 50 drops per minute (change tubing once every 24 hours) Start date June 18th, stop date July 1st    Historical Provider, MD  furosemide (LASIX) 40 MG tablet Take 20 mg by mouth. 02/06/14   Esperanza Sheets, MD  HYDROcodone-acetaminophen (NORCO) 10-325 MG per tablet Take 1 tablet by mouth every 6 (six) hours. scheduled 02/08/14   Esperanza Sheets, MD  isosorbide mononitrate (IMDUR) 15 mg TB24 24 hr tablet Take 0.5 tablets (15 mg total) by mouth daily.  02/06/14   Esperanza Sheets, MD  lactose free nutrition (BOOST) LIQD Take 237 mLs by mouth 3 (three) times daily between meals.    Historical Provider, MD  levothyroxine (SYNTHROID, LEVOTHROID) 88 MCG tablet Take 88 mcg by mouth daily before breakfast.    Historical Provider, MD  nitroGLYCERIN (NITROSTAT) 0.4 MG SL tablet Place 0.4 mg under the tongue every 5 (five) minutes as needed for chest pain.    Historical Provider, MD  omeprazole (PRILOSEC) 40 MG capsule Take 1 capsule (40 mg total) by mouth daily. 11/27/12   Rhonda G Barrett, PA-C  potassium chloride (K-DUR,KLOR-CON) 10 MEQ tablet Take 10 mEq by mouth daily.    Historical Provider, MD  sodium chloride 0.9 % injection 10-40 mLs by Intracatheter route as needed (flush). 01/29/14   Ripudeep Jenna Luo, MD  Tamsulosin HCl (FLOMAX) 0.4 MG CAPS  Take 1 capsule (0.4 mg total) by mouth daily. 08/29/12   Dayna N Dunn, PA-C      Allergies  Allergen Reactions  . Heparin Other (See Comments)    Clots blood instead of thinning  . Hydromorphone Hcl Other (See Comments)    Crazy feeling  . Warfarin And Related Other (See Comments)    Syncope events  . Altace [Ramipril] Hives  . Contrast Media [Iodinated Diagnostic Agents] Other (See Comments)    Reaction noted by md- might have caused aneurism in 1999  . Morphine And Related Nausea And Vomiting  . Plavix [Clopidogrel Bisulfate] Hives and Itching     Social History:  reports that she has never smoked. She has never used smokeless tobacco. She reports that she does not drink alcohol or use illicit drugs.     Family History  Problem Relation Age of Onset  . Cancer Mother 57    died  . Lung disease Father 37    died  . Heart disease Sister     2 sisters and her son  . Anesthesia problems Neg Hx        Physical Exam:  GEN:  Pleasant Thin Frail Elderly 80 y.o. Caucasian female examined and in no acute distress; cooperative with exam Filed Vitals:   04/17/14 0445 04/17/14 0530 04/17/14 0600  04/17/14 0645  BP: 136/62 154/73 168/71 168/80  Pulse: 68 70 70 74  Temp:      TempSrc:      Resp: SpO2: 100% 97% 100% 99%   Blood pressure 168/80, pulse 74, temperature 97.7 F (36.5 C), temperature source Oral, resp. rate 20, SpO2 99.00%. PSYCH: She is alert and oriented x4; does not appear anxious does not appear depressed; affect is normal HEENT: Normocephalic and Atraumatic, Mucous membranes pink; PERRLA; EOM intact; Fundi:  Benign;  No scleral icterus, Nares: Patent, Oropharynx: Clear,    Neck:  FROM, No Cervical Lymphadenopathy nor Thyromegaly or Carotid Bruit; No JVD; Breasts:: Not examined CHEST WALL: No tenderness CHEST: Decreased Breath sounds, Tachypneic with Bibasilar Rales,  No wheezes, No rhonchi HEART: Iregular rate and rhythm BACK: No kyphosis or scoliosis; No CVA tenderness ABDOMEN: Positive Bowel Sounds,  Soft Non-Tender; No Masses, No Organomegaly, No Pannus; No Intertriginous candida. Rectal Exam: Not done EXTREMITIES: No Cyanosis, Clubbing, or Edema; No Ulcerations. Genitalia: not examined PULSES: 2+ and symmetric SKIN: Normal hydration no rash or ulceration CNS: Alert and Oriented x 4, No Focal Deficits Vascular: pulses palpable throughout    Labs on Admission:  Basic Metabolic Panel:  Recent Labs Lab 04/17/14 0447  NA 138  K 3.4*  CL 99  CO2 24  GLUCOSE 106*  BUN 24*  CREATININE 1.47*  CALCIUM 8.9   Liver Function Tests:  Recent Labs Lab 04/17/14 0447  AST 15  ALT 6  ALKPHOS 66  BILITOT 0.9  PROT 6.5  ALBUMIN 3.4*   No results found for this basename: LIPASE, AMYLASE,  in the last 168 hours No results found for this basename: AMMONIA,  in the last 168 hours CBC:  Recent Labs Lab 04/17/14 0447  WBC 5.0  NEUTROABS 3.5  HGB 11.1*  HCT 35.1*  MCV 89.8  PLT 171   Cardiac Enzymes:  Recent Labs Lab 04/17/14 0447  TROPONINI <0.30    BNP (last 3 results)  Recent Labs  01/26/14 1030 04/17/14 0447  PROBNP  9027.0* 30005.0*   CBG: No results found for this basename: GLUCAP,  in the  last 168 hours  Radiological Exams on Admission: Dg Chest Port 1 View  04/17/2014   CLINICAL DATA:  Shortness of breath  EXAM: PORTABLE CHEST - 1 VIEW  COMPARISON:  02/07/2014  FINDINGS: Cardiac pacemaker. Postoperative change in the mediastinum. Shallow inspiration with elevation of right hemidiaphragm. Interval development of linear atelectasis in the right mid lung with small right pleural effusion. Probable emphysematous changes in the lungs. Calcified and tortuous aorta. Degenerative changes in the spine and shoulders.  IMPRESSION: Developing linear atelectasis in the right mid lung and small right pleural effusion.   Electronically Signed   By: Burman Nieves M.D.   On: 04/17/2014 05:24     EKG: Independently reviewed.    Assessment/Plan:   80 y.o. female with  Principal Problem:   1.   Acute on chronic systolic CHF (congestive heart failure)   Acute CHF Protocol   Diuresis with IV Lasix   2D ECHO ordered   On Carvedilol,    Bipap PRN  Active Problems:   2.   Chest pain   Check Troponins     3.   CAD- CABG in '04, cath 7/11 [place]   History     4.    Atrial fibrillation-  Unable to take Lovenox or Heparins   Rate controlled   On amiodarone, and Carvedilol for Rate and Rhythm Control        5.  HYPERLIPIDEMIA   Not currently on meds   Check lipid panel in AM.           6.  HYPERTENSION   On Carvedilol   Monitor BPs   IV Hydralazine PRN    7.  RENAL ARTERY STENOSIS   No ACe Inhibitors     8.  CKD stage 3, GFR 30-59 ml/min   Monitor BUN/Cr        9.  Hypothyroidism   Continue Levothyroxine   10.  Anemia   Send Anemia Panel   11.  DVT prophylaxis  SCDs     Code Status:   DO NOT RESUSCITATE   Family Communication:   Daughter at Bedside Disposition Plan:   Inpatient  Time spent:  67 Minutes  Ron Parker [facility] Pager [phone]   If 7AM -7PM  Please Contact the Day Rounding Team MD for [facility]  If 7PM-7AM, Please Contact night-coverage  [email or web address] Password [email or web address] 04/17/2014, 6:56 AM

## 2014-04-17 NOTE — ED Notes (Signed)
Dr. Jenkins at bedside. 

## 2014-04-17 NOTE — ED Provider Notes (Signed)
CSN: [id number]     Arrival date & time 04/17/14  0418 History   First MD Initiated Contact with Patient 04/17/14 [phone]     Chief Complaint  Patient presents with  . Chest Pain  . Shortness of Breath     (Consider location/radiation/quality/duration/timing/severity/associated sxs/prior Treatment) HPI Patient presents with shortness of breath and epigastric pain upon waking. She states she thinks that fluid has reaccumulated in her lungs. She denies coughing, fever or chills. She has bilateral lower extremity swelling. Patient has a history of congestive heart failure, coronary artery disease and tachybradycardia syndrome with pacer defibrillator implanted. Just has a history of a fibrillation but has not candidate for anticoagulation Past Medical History  Diagnosis Date  . Diabetes mellitus without complication   . Chest pain   . DJD (degenerative joint disease)   . Tachycardia-bradycardia syndrome   . CKD (chronic kidney disease)   . Cardiomegaly   . Coronary artery disease     a. LAD stent 1999 after NSTEMI. b. CABG 2004. c. Last LHC 02/2010 - patent grafts.  . Hypertension   . Hyperlipidemia   . Carotid artery disease     status post right carotid endarterectomy.  . Degenerative joint disease   . Restless leg syndrome   . Chronic renal insufficiency   . Heparin induced thrombocytopenia   . Diphtheria     "as a child"  . Pleural effusion, bilateral 06/2003  . Pseudoaneurysm     status post repair following catherization in 1999  . Chronic systolic heart failure 10/02/11  . Ischemic cardiomyopathy     Echocardiogram 10/01/11: Mild LVH, EF 35-40%, basal inferior, basal to mid posterior and basal to mid anterolateral severe hypokinesis, mild to moderate AI, moderate MR (ischemic MR), moderate LAE, mild RVE, mildly reduced RV systolic function, mild RAE, PASP 43-44  . COPD (chronic obstructive pulmonary disease)   . Atrial fibrillation/flutter     s/p multiple DCCVs;  amiodarone  started 09/2011, TEE DCCV 3/13;  amio and coumadin d/c'd after admxn to [facility] 02/2012 with frequent falls/syncope  . Tachycardia-bradycardia syndrome     s/p Medtronic pacemaker implant 09/2011 (8 sec pause noted)  . GERD (gastroesophageal reflux disease)   . Renal artery stenosis     bilateral- status post stenting  . Non-functioning kidney   . Depression     "cries alot"  . Gout   . Hypothyroidism   . Anemia   . Pacemaker    Past Surgical History  Procedure Laterality Date  . Pacemaker insertion    . Carotid endarterectomy  05/2003    right  . Pacemaker insertion  10/02/11    implanted by Dr Johney Frame  . Cholecystectomy  ?02/2011  . Tonsillectomy and adenoidectomy    . Renal artery stent  06/2003    bilaterally/e-chart  . Thoracentesis  ? 2000; 05/2011  . Coronary angioplasty with stent placement  1999  . Av fistula repair  01/1998    w/pseudoaneurysm repair S/P catheterization/E-chart  . Coronary artery bypass graft  05/2003    CABG X 3  . Tee without cardioversion  10/23/2011    Procedure: TRANSESOPHAGEAL ECHOCARDIOGRAM (TEE);  Surgeon: Wendall Stade, MD;  Location: [facility] ENDOSCOPY;  Service: Cardiovascular;  Laterality: N/A;  . Cardioversion  10/23/2011    Procedure: CARDIOVERSION;  Surgeon: Wendall Stade, MD;  Location: [facility] ENDOSCOPY;  Service: Cardiovascular;  Laterality: N/A;   Family History  Problem Relation Age of Onset  . Cancer Mother 74  died  . Lung disease Father 84    died  . Heart disease Sister     2 sisters and her son  . Anesthesia problems Neg Hx    History  Substance Use Topics  . Smoking status: Never Smoker   . Smokeless tobacco: Never Used  . Alcohol Use: No   OB History   Grav Para Term Preterm Abortions TAB SAB Ect Mult Living                 Review of Systems  Constitutional: Negative for fever and chills.  Respiratory: Positive for shortness of breath. Negative for cough.   Cardiovascular: Positive for chest pain and leg swelling. Negative  for palpitations.  Gastrointestinal: Positive for abdominal pain. Negative for nausea, vomiting and diarrhea.  Musculoskeletal: Negative for back pain, neck pain and neck stiffness.  Skin: Negative for rash and wound.  Neurological: Negative for dizziness, weakness, light-headedness, numbness and headaches.  All other systems reviewed and are negative.     Allergies  Heparin; Hydromorphone hcl; Warfarin and related; Altace; Contrast media; Morphine and related; and Plavix  Home Medications   Prior to Admission medications   Medication Sig Start Date End Date Taking? Authorizing Provider  amiodarone (PACERONE) 200 MG tablet Take 100 mg by mouth daily.   Yes Historical Provider, MD  aspirin EC 81 MG tablet Take 81 mg by mouth daily.   Yes Historical Provider, MD  carvedilol (COREG) 12.5 MG tablet Take 1 tablet (12.5 mg total) by mouth 2 (two) times daily with a meal. 03/10/14  Yes Rollene Rotunda, MD  cyanocobalamin (,VITAMIN B-12,) 1000 MCG/ML injection Inject 1,000 mcg into the muscle every 30 (thirty) days. Done at Dr. Joyce Copa office   Yes Historical Provider, MD  docusate sodium (COLACE) 100 MG capsule Take 100 mg by mouth daily.    Yes Historical Provider, MD  furosemide (LASIX) 40 MG tablet Take 40 mg by mouth daily.  02/06/14  Yes Esperanza Sheets, MD  HYDROcodone-acetaminophen (NORCO) 10-325 MG per tablet Take 1 tablet by mouth every 6 (six) hours. scheduled 02/08/14  Yes Esperanza Sheets, MD  isosorbide mononitrate (IMDUR) 15 mg TB24 24 hr tablet Take 0.5 tablets (15 mg total) by mouth daily. 02/06/14  Yes Esperanza Sheets, MD  lactose free nutrition (BOOST) LIQD Take 237 mLs by mouth 3 (three) times daily between meals.   Yes Historical Provider, MD  levothyroxine (SYNTHROID, LEVOTHROID) 88 MCG tablet Take 88 mcg by mouth daily before breakfast.   Yes Historical Provider, MD  LORazepam (ATIVAN) 0.5 MG tablet Take 0.5 mg by mouth daily.   Yes Historical Provider, MD  omeprazole  (PRILOSEC) 40 MG capsule Take 1 capsule (40 mg total) by mouth daily. 11/27/12  Yes Rhonda G Barrett, PA-C  potassium chloride (K-DUR,KLOR-CON) 10 MEQ tablet Take 10 mEq by mouth daily.   Yes Historical Provider, MD  Tamsulosin HCl (FLOMAX) 0.4 MG CAPS Take 1 capsule (0.4 mg total) by mouth daily. 08/29/12  Yes Dayna N Dunn, PA-C  nitroGLYCERIN (NITROSTAT) 0.4 MG SL tablet Place 0.4 mg under the tongue every 5 (five) minutes as needed for chest pain.    Historical Provider, MD   BP 118/90  Pulse 70  Temp(Src) 98.1 F (36.7 C) (Oral)  Resp 26  Ht  (1.626 m)  Wt 105 lb 9.6 oz (47.9 kg)  BMI 18.12 kg/m2  SpO2 100% Physical Exam  Nursing note and vitals reviewed. Constitutional: She is oriented to person, place,  and time. She appears well-developed and well-nourished. No distress.  HENT:  Head: Normocephalic and atraumatic.  Mouth/Throat: Oropharynx is clear and moist.  Eyes: EOM are normal. Pupils are equal, round, and reactive to light.  Neck: Normal range of motion. Neck supple.  Cardiovascular: Normal rate and regular rhythm.   Pulmonary/Chest: No respiratory distress. She has no wheezes. She has rales. She exhibits no tenderness.  Mild tachypnea. Crackles in bilateral bases.  Abdominal: Soft. Bowel sounds are normal. She exhibits distension (Mild distention). She exhibits no mass. There is tenderness (tenderness to palpation in the epigastric and left upper quadrants.). There is no rebound and no guarding.  Musculoskeletal: Normal range of motion. She exhibits no edema and no tenderness.  Bilateral lower extremity swelling, 1+ edema.  Neurological: She is alert and oriented to person, place, and time.  Moves all extremities without focal weakness. Sensation is grossly intact.  Skin: Skin is warm and dry. No rash noted. No erythema.  Psychiatric: She has a normal mood and affect. Her behavior is normal.    ED Course  Procedures (including critical care time) Labs Review Labs  Reviewed  CBC WITH DIFFERENTIAL - Abnormal; Notable for the following:    Hemoglobin 11.1 (*)    HCT 35.1 (*)    RDW 16.5 (*)    All other components within normal limits  COMPREHENSIVE METABOLIC PANEL - Abnormal; Notable for the following:    Potassium 3.4 (*)    Glucose, Bld 106 (*)    BUN 24 (*)    Creatinine, Ser 1.47 (*)    Albumin 3.4 (*)    GFR calc non Af Amer 31 (*)    GFR calc Af Amer 36 (*)    All other components within normal limits  PRO B NATRIURETIC PEPTIDE - Abnormal; Notable for the following:    Pro B Natriuretic peptide (BNP) 30005.0 (*)    All other components within normal limits  BASIC METABOLIC PANEL - Abnormal; Notable for the following:    BUN 25 (*)    Creatinine, Ser 1.50 (*)    GFR calc non Af Amer 30 (*)    GFR calc Af Amer 35 (*)    All other components within normal limits  CBC - Abnormal; Notable for the following:    RBC 3.69 (*)    Hemoglobin 10.5 (*)    HCT 33.1 (*)    RDW 16.6 (*)    All other components within normal limits  LIPID PANEL - Abnormal; Notable for the following:    HDL 28 (*)    All other components within normal limits  BASIC METABOLIC PANEL - Abnormal; Notable for the following:    Sodium 136 (*)    BUN 24 (*)    Creatinine, Ser 1.44 (*)    GFR calc non Af Amer 32 (*)    GFR calc Af Amer 37 (*)    All other components within normal limits  CBC - Abnormal; Notable for the following:    Hemoglobin 10.6 (*)    HCT 33.9 (*)    RDW 16.6 (*)    All other components within normal limits  MRSA PCR SCREENING  CULTURE, BLOOD (ROUTINE X 2)  CULTURE, BLOOD (ROUTINE X 2)  CULTURE, EXPECTORATED SPUTUM-ASSESSMENT  TROPONIN I  URINALYSIS, ROUTINE W REFLEX MICROSCOPIC  TSH  TROPONIN I  TROPONIN I  TROPONIN I    Imaging Review Dg Chest 2 View  04/18/2014   CLINICAL DATA:  Follow-up fusion.  Evaluate  for pneumonia.  EXAM: CHEST  2 VIEW  COMPARISON:  04/17/2014  FINDINGS: Sequelae of prior CABG are again identified. Left-sided  dual lead pacemaker remains in place. Cardiac silhouette remains enlarged. Thoracic aorta is again seen to be tortuous and calcified. There is persistent mild elevation of the right hemidiaphragm. Small right pleural effusion is again seen. Linear opacity in the right midlung is unchanged and most compatible with subsegmental atelectasis. There is new, mild opacity in the right upper lobe. 3 mm nodule in the left upper lobe is unchanged. No pneumothorax is identified. Degenerative changes are again noted about both glenohumeral joints with old traumatic deformity of the proximal left humerus.  IMPRESSION: 1. New, ill-defined opacity in the right upper lobe, which may reflect developing infectious infiltrate. 2. Persistent small right pleural effusion. 3. Persistent right midlung subsegmental atelectasis.   Electronically Signed   By: Sebastian Ache   On: 04/18/2014 15:58   Dg Chest Port 1 View  04/17/2014   CLINICAL DATA:  Shortness of breath  EXAM: PORTABLE CHEST - 1 VIEW  COMPARISON:  02/07/2014  FINDINGS: Cardiac pacemaker. Postoperative change in the mediastinum. Shallow inspiration with elevation of right hemidiaphragm. Interval development of linear atelectasis in the right mid lung with small right pleural effusion. Probable emphysematous changes in the lungs. Calcified and tortuous aorta. Degenerative changes in the spine and shoulders.  IMPRESSION: Developing linear atelectasis in the right mid lung and small right pleural effusion.   Electronically Signed   By: Burman Nieves M.D.   On: 04/17/2014 05:24     EKG Interpretation None      MDM   Final diagnoses:  Acute on chronic congestive heart failure, unspecified congestive heart failure type  Atypical chest pain   Discussed with Dr. Lovell Sheehan. Will admit for acute CHF exacerbation.     Loren Racer, MD 04/19/14 [phone]

## 2014-04-17 NOTE — ED Notes (Signed)
Report attempted 

## 2014-04-18 ENCOUNTER — Inpatient Hospital Stay (HOSPITAL_COMMUNITY): Payer: Medicare Other

## 2014-04-18 DIAGNOSIS — I369 Nonrheumatic tricuspid valve disorder, unspecified: Secondary | ICD-10-CM

## 2014-04-18 DIAGNOSIS — J189 Pneumonia, unspecified organism: Secondary | ICD-10-CM

## 2014-04-18 DIAGNOSIS — R079 Chest pain, unspecified: Secondary | ICD-10-CM

## 2014-04-18 LAB — CBC
HCT: 33.1 % — ABNORMAL LOW (ref 36.0–46.0)
HEMOGLOBIN: 10.5 g/dL — AB (ref 12.0–15.0)
MCH: 28.5 pg (ref 26.0–34.0)
MCHC: 31.7 g/dL (ref 30.0–36.0)
MCV: 89.7 fL (ref 78.0–100.0)
PLATELETS: 161 10*3/uL (ref 150–400)
RBC: 3.69 MIL/uL — ABNORMAL LOW (ref 3.87–5.11)
RDW: 16.6 % — ABNORMAL HIGH (ref 11.5–15.5)
WBC: 4.8 10*3/uL (ref 4.0–10.5)

## 2014-04-18 LAB — BASIC METABOLIC PANEL
Anion gap: 12 (ref 5–15)
BUN: 25 mg/dL — ABNORMAL HIGH (ref 6–23)
CO2: 26 mEq/L (ref 19–32)
Calcium: 8.5 mg/dL (ref 8.4–10.5)
Chloride: 100 mEq/L (ref 96–112)
Creatinine, Ser: 1.5 mg/dL — ABNORMAL HIGH (ref 0.50–1.10)
GFR, EST AFRICAN AMERICAN: 35 mL/min — AB (ref >90)
GFR, EST NON AFRICAN AMERICAN: 30 mL/min — AB (ref >90)
Glucose, Bld: 89 mg/dL (ref 70–99)
POTASSIUM: 4 meq/L (ref 3.7–5.3)
Sodium: 138 mEq/L (ref 137–147)

## 2014-04-18 LAB — LIPID PANEL
CHOLESTEROL: 63 mg/dL (ref 0–200)
HDL: 28 mg/dL — ABNORMAL LOW (ref >39)
LDL Cholesterol: 22 mg/dL (ref 0–99)
Total CHOL/HDL Ratio: 2.3 RATIO
Triglycerides: 63 mg/dL (ref <150)
VLDL: 13 mg/dL (ref 0–40)

## 2014-04-18 MED ORDER — NITROGLYCERIN 0.4 MG SL SUBL
0.4000 mg | SUBLINGUAL_TABLET | SUBLINGUAL | Status: DC | PRN
  Administered 2014-04-18: 0.4 mg via SUBLINGUAL
  Filled 2014-04-18: qty 1

## 2014-04-18 NOTE — Progress Notes (Signed)
[facility] PROGRESS NOTE  [name] [name] UMP:[id number] DOB: 1925/03/11 DOA: 04/17/2014 PCP: Josue Hector, MD  Assessment/Plan: 1-Acute on chronic systolic and diastolic CHF (congestive heart failure, Last EF 35 %  Continue with  lasix to 40 Mg IV BID. Continue with coreg.  ECHO pending.  Cardiology following.  Weight: 49---50 Strict I and O : 1 L urine out put on 9-5  PNA, Health care associated.  Reports chills, cough, chest x ray with linear atelectasis.  blood culture, sputum culture pending.  Continue with Vancomycin and cefepime day 2.  Repeat chest x ray today pending.   Chest pain: Troponin negative, times 3. PRN nitroglycerin. Coreg, aspirin,.  LDL 22.   Atrial fibrillation On amiodarone, and Carvedilol for Rate and Rhythm Control  RENAL ARTERY STENOSIS  No ACe Inhibitors  CKD stage 3, GFR 30-59 ml/min  Monitor BUN/Cr Mildly increase Cr. Would monitor on lasix.   Hypothyroidism  Continue Levothyroxine   Code Status: DNR Family Communication: Care discussed with patient.  Disposition Plan: Remain inpatient.    Consultants:  Cardiology  Procedures:  ECHO: Pending.   Antibiotics:  Vancomycin 9-5  Cefepime 9-5  HPI/Subjective: Feeling better than early. Had an episode of chest pain. It happens after a coughing spell. She is coughing.  No chest pain, no worsening dyspnea. No abdominal pain.   Objective: Filed Vitals:   04/18/14 0643  BP:   Pulse:   Temp: 98.1 F (36.7 C)  Resp:     Intake/Output Summary (Last 24 hours) at 04/18/14 0728 Last data filed at 04/18/14 0400  Gross per 24 hour  Intake    410 ml  Output   1050 ml  Net   -640 ml   Filed Weights   04/17/14 0900 04/18/14 0338  Weight: 49.1 kg (108 lb 3.9 oz) 50 kg (110 lb 3.7 oz)    Exam:   General:  Alert, in no distress.   Cardiovascular: S 1, S 2 IRR  Respiratory: Decrease breath sounds, crackles bilaterally.   Abdomen: BS present, soft,  NT  Musculoskeletal: no edema.   Data Reviewed: Basic Metabolic Panel:  Recent Labs Lab 04/17/14 0447 04/18/14 0344  NA 138 138  K 3.4* 4.0  CL 99 100  CO2 24 26  GLUCOSE 106* 89  BUN 24* 25*  CREATININE 1.47* 1.50*  CALCIUM 8.9 8.5   Liver Function Tests:  Recent Labs Lab 04/17/14 0447  AST 15  ALT 6  ALKPHOS 66  BILITOT 0.9  PROT 6.5  ALBUMIN 3.4*   No results found for this basename: LIPASE, AMYLASE,  in the last 168 hours No results found for this basename: AMMONIA,  in the last 168 hours CBC:  Recent Labs Lab 04/17/14 0447 04/18/14 0344  WBC 5.0 4.8  NEUTROABS 3.5  --   HGB 11.1* 10.5*  HCT 35.1* 33.1*  MCV 89.8 89.7  PLT 171 161   Cardiac Enzymes:  Recent Labs Lab 04/17/14 0447 04/17/14 0902 04/17/14 1420 04/17/14 1930  TROPONINI <0.30 <0.30 <0.30 <0.30   BNP (last 3 results)  Recent Labs  01/26/14 1030 04/17/14 0447  PROBNP 9027.0* 30005.0*   CBG: No results found for this basename: GLUCAP,  in the last 168 hours  Recent Results (from the past 240 hour(s))  MRSA PCR SCREENING     Status: None   Collection Time    04/17/14  8:37 AM      Result Value Ref Range Status   MRSA by PCR NEGATIVE  NEGATIVE  Final   Comment:            The GeneXpert MRSA Assay (FDA     approved for NASAL specimens     only), is one component of a     comprehensive MRSA colonization     surveillance program. It is not     intended to diagnose MRSA     infection nor to guide or     monitor treatment for     MRSA infections.     Studies: Dg Chest Port 1 View  04/17/2014   CLINICAL DATA:  Shortness of breath  EXAM: PORTABLE CHEST - 1 VIEW  COMPARISON:  02/07/2014  FINDINGS: Cardiac pacemaker. Postoperative change in the mediastinum. Shallow inspiration with elevation of right hemidiaphragm. Interval development of linear atelectasis in the right mid lung with small right pleural effusion. Probable emphysematous changes in the lungs. Calcified and tortuous  aorta. Degenerative changes in the spine and shoulders.  IMPRESSION: Developing linear atelectasis in the right mid lung and small right pleural effusion.   Electronically Signed   By: Burman Nieves M.D.   On: 04/17/2014 05:24    Scheduled Meds: . amiodarone  100 mg Oral Daily  . antiseptic oral rinse  7 mL Mouth Rinse BID  . aspirin EC  81 mg Oral Daily  . carvedilol  12.5 mg Oral BID WC  . ceFEPime (MAXIPIME) IV  500 mg Intravenous Daily  . Cholecalciferol  1,000 Units Oral Daily  . docusate sodium  200 mg Oral BID  . furosemide  40 mg Intravenous Q12H  . lactose free nutrition  237 mL Oral TID BM  . levothyroxine  88 mcg Oral QAC breakfast  . pantoprazole  40 mg Oral Daily  . potassium chloride  20 mEq Oral BID  . sodium chloride  3 mL Intravenous Q12H  . tamsulosin  0.4 mg Oral Daily  . vancomycin  500 mg Intravenous Q24H   Continuous Infusions:   Principal Problem:   Acute on chronic systolic CHF (congestive heart failure) Active Problems:   HYPERLIPIDEMIA   HYPERTENSION   CAD- CABG in '04, cath 7/11 [place]   RENAL ARTERY STENOSIS   CKD stage 3, GFR 30-59 ml/min   Hypothyroidism   Chest pain   Renal insufficiency   Anemia    Time spent: 30 minutes.     Hartley Barefoot A  [facility] Pager [phone]. If 7PM-7AM, please contact night-coverage at [email or web address], password [facility] 04/18/2014, 7:28 AM  LOS: 1 day

## 2014-04-18 NOTE — Progress Notes (Signed)
    Subjective:  Chest pain earlier increased with cough; dyspneic but improving   Objective:  Filed Vitals:   04/17/14 1936 04/17/14 2305 04/18/14 0338 04/18/14 0643  BP: 136/66 138/88 146/88   Pulse: 70 70 70   Temp: 97.4 F (36.3 C) 97.5 F (36.4 C) 97.7 F (36.5 C) 98.1 F (36.7 C)  TempSrc: Oral Oral Oral   Resp: 18 22 26    Height:      Weight:   110 lb 3.7 oz (50 kg)   SpO2: 100% 96% 98%     Intake/Output from previous day:  Intake/Output Summary (Last 24 hours) at 04/18/14 1036 Last data filed at 04/18/14 0400  Gross per 24 hour  Intake    310 ml  Output   1050 ml  Net   -740 ml    Physical Exam: Physical exam: Well-developed frail in no acute distress.  Skin is warm and dry.  HEENT is normal.  Neck is supple.  Chest with minimal basilar crackles Cardiovascular exam is regular rate and rhythm. 2/6 systolic murmur apex. Abdominal exam nontender or distended. No masses palpated. Extremities show no edema. neuro grossly intact    Lab Results: Basic Metabolic Panel:  Recent Labs  32/76/14 0447 04/18/14 0344  NA 138 138  K 3.4* 4.0  CL 99 100  CO2 24 26  GLUCOSE 106* 89  BUN 24* 25*  CREATININE 1.47* 1.50*  CALCIUM 8.9 8.5   CBC:  Recent Labs  04/17/14 0447 04/18/14 0344  WBC 5.0 4.8  NEUTROABS 3.5  --   HGB 11.1* 10.5*  HCT 35.1* 33.1*  MCV 89.8 89.7  PLT 171 161   Cardiac Enzymes:  Recent Labs  04/17/14 0902 04/17/14 1420 04/17/14 1930  TROPONINI <0.30 <0.30 <0.30     Assessment/Plan:  1 pneumonia-antibiotics per primary care. 2 acute on chronic combined systolic/diastolic congestive heart failure-she does not appear to be significantly volume overloaded on exam. We'll continue Lasix IV today. Possible transition to by mouth tomorrow. Follow renal function. 3 chest pain-pain increases with cough. Enzymes negative thus far. 4 history of atrial fibrillation-she remains in an AV paced rhythm. Continue amiodarone and Coreg.  Not on Coumadin given history of falls. Continue aspirin. 5 history of pacemaker 6 chronic stage III renal failure-follow renal function closely with diuresis. 7 ischemic cardiomyopathy-continue beta blocker. Not clear why she is not on ACE inhibitor. Possibly related to renal insufficiency. Can consider hydralazine nitrates in the future if not able to add ACE inhibitor to  Olga Millers 04/18/2014, 10:36 AM

## 2014-04-18 NOTE — Progress Notes (Signed)
  Echocardiogram 2D Echocardiogram has been performed.  Janalyn Harder 04/18/2014, 10:07 AM

## 2014-04-18 NOTE — Progress Notes (Signed)
0645 pt c/o new chest pain. 5/10 "Different than when she first came to the hospital". M. Lynch NP Notified and gave orders. Orders followed. Will continue to monitor 0710 Pt stated that the chest pain is much better and pt is resting comfortably. Passed on in report 0730 MD at bedside 

## 2014-04-19 LAB — CBC
HCT: 33.9 % — ABNORMAL LOW (ref 36.0–46.0)
HEMOGLOBIN: 10.6 g/dL — AB (ref 12.0–15.0)
MCH: 27.4 pg (ref 26.0–34.0)
MCHC: 31.3 g/dL (ref 30.0–36.0)
MCV: 87.6 fL (ref 78.0–100.0)
PLATELETS: 169 10*3/uL (ref 150–400)
RBC: 3.87 MIL/uL (ref 3.87–5.11)
RDW: 16.6 % — ABNORMAL HIGH (ref 11.5–15.5)
WBC: 5.4 10*3/uL (ref 4.0–10.5)

## 2014-04-19 LAB — BASIC METABOLIC PANEL
Anion gap: 11 (ref 5–15)
BUN: 24 mg/dL — AB (ref 6–23)
CO2: 27 mEq/L (ref 19–32)
CREATININE: 1.44 mg/dL — AB (ref 0.50–1.10)
Calcium: 8.8 mg/dL (ref 8.4–10.5)
Chloride: 98 mEq/L (ref 96–112)
GFR calc Af Amer: 37 mL/min — ABNORMAL LOW (ref >90)
GFR, EST NON AFRICAN AMERICAN: 32 mL/min — AB (ref >90)
GLUCOSE: 92 mg/dL (ref 70–99)
POTASSIUM: 4.2 meq/L (ref 3.7–5.3)
Sodium: 136 mEq/L — ABNORMAL LOW (ref 137–147)

## 2014-04-19 MED ORDER — FUROSEMIDE 40 MG PO TABS
40.0000 mg | ORAL_TABLET | Freq: Two times a day (BID) | ORAL | Status: DC
  Administered 2014-04-19 – 2014-04-20 (×3): 40 mg via ORAL
  Filled 2014-04-19 (×5): qty 1

## 2014-04-19 NOTE — Progress Notes (Signed)
[facility] PROGRESS NOTE  BOBBY MURRAH BPP:[id number] DOB: May 12, 1925 DOA: 04/17/2014 PCP: Josue Hector, MD  Assessment/Plan: 1-Acute on chronic systolic and diastolic CHF (congestive heart failure, Last EF 35 %  She was treated with asix to 40 Mg IV BID, subsequently she was transition to oral lasix  Continue with coreg.  ECHO Ef 35 %.  Cardiology following.  Weight: 49---50---47---46 Strict I and O : Negative 2 L.   PNA, Health care associated.  Reports chills, cough, chest x ray with linear atelectasis.  blood culture, sputum culture pending.  Continue with Vancomycin and cefepime day 3.  Chest x ray confirm PNA, new, ill-defined opacity in the right upper lobe, which may reflect developing infectious infiltrate.  Chest pain: Troponin negative, times 3. PRN nitroglycerin. Coreg, aspirin,.  LDL 22.   Atrial fibrillation On amiodarone, and Carvedilol for Rate and Rhythm Control  RENAL ARTERY STENOSIS  No ACe Inhibitors  CKD stage 3, GFR 30-59 ml/min  Monitor BUN/Cr, Cr decrease to 1.4.  Would monitor on lasix.   Hypothyroidism  Continue Levothyroxine   Code Status: DNR Family Communication: Care discussed with patient.  Disposition Plan: Remain inpatient.    Consultants:  Cardiology  Procedures: ECHO: Left ventricle: The cavity size was normal. Wall thickness was increased in a pattern of mild LVH. Systolic function was moderately to severely reduced. The estimated ejection fraction was in the range of 30% to 35%. Diffuse hypokinesis. There is akinesis of the inferolateral myocardium. The study is not technically sufficient to allow evaluation of LV diastolic function.  Antibiotics:  Vancomycin 9-5  Cefepime 9-5  HPI/Subjective: She is breathing better, cough has improved.   Objective: Filed Vitals:   04/19/14 1344  BP: 130/53  Pulse: 75  Temp: 98.2 F (36.8 C)  Resp: 18    Intake/Output Summary (Last 24 hours) at 04/19/14  1450 Last data filed at 04/19/14 1300  Gross per 24 hour  Intake    150 ml  Output   1550 ml  Net  -1400 ml   Filed Weights   04/18/14 0338 04/19/14 0410 04/19/14 1344  Weight: 50 kg (110 lb 3.7 oz) 47.9 kg (105 lb 9.6 oz) 46.1 kg (101 lb 10.1 oz)    Exam:   General:  Alert, in no distress.   Cardiovascular: S 1, S 2 IRR  Respiratory: Decrease breath sounds, crackles bilaterally.   Abdomen: BS present, soft, NT  Musculoskeletal: no edema.   Data Reviewed: Basic Metabolic Panel:  Recent Labs Lab 04/17/14 0447 04/18/14 0344 04/19/14 0322  NA 138 138 136*  K 3.4* 4.0 4.2  CL 99 100 98  CO2 24 26 27   GLUCOSE 106* 89 92  BUN 24* 25* 24*  CREATININE 1.47* 1.50* 1.44*  CALCIUM 8.9 8.5 8.8   Liver Function Tests:  Recent Labs Lab 04/17/14 0447  AST 15  ALT 6  ALKPHOS 66  BILITOT 0.9  PROT 6.5  ALBUMIN 3.4*   No results found for this basename: LIPASE, AMYLASE,  in the last 168 hours No results found for this basename: AMMONIA,  in the last 168 hours CBC:  Recent Labs Lab 04/17/14 0447 04/18/14 0344 04/19/14 0322  WBC 5.0 4.8 5.4  NEUTROABS 3.5  --   --   HGB 11.1* 10.5* 10.6*  HCT 35.1* 33.1* 33.9*  MCV 89.8 89.7 87.6  PLT 171 161 169   Cardiac Enzymes:  Recent Labs Lab 04/17/14 0447 04/17/14 0902 04/17/14 1420 04/17/14 1930  TROPONINI <0.30 <0.30 <0.30 <  0.30   BNP (last 3 results)  Recent Labs  01/26/14 1030 04/17/14 0447  PROBNP 9027.0* 30005.0*   CBG: No results found for this basename: GLUCAP,  in the last 168 hours  Recent Results (from the past 240 hour(s))  MRSA PCR SCREENING     Status: None   Collection Time    04/17/14  8:37 AM      Result Value Ref Range Status   MRSA by PCR NEGATIVE  NEGATIVE Final   Comment:            The GeneXpert MRSA Assay (FDA     approved for NASAL specimens     only), is one component of a     comprehensive MRSA colonization     surveillance program. It is not     intended to diagnose  MRSA     infection nor to guide or     monitor treatment for     MRSA infections.  CULTURE, BLOOD (ROUTINE X 2)     Status: None   Collection Time    04/17/14 10:20 AM      Result Value Ref Range Status   Specimen Description BLOOD RIGHT ARM   Final   Special Requests BOTTLES DRAWN AEROBIC AND ANAEROBIC 10CC   Final   Culture  Setup Time     Final   Value: 04/18/2014 02:07     Performed at [facility]   Culture     Final   Value:        BLOOD CULTURE RECEIVED NO GROWTH TO DATE CULTURE WILL BE HELD FOR 5 DAYS BEFORE ISSUING A FINAL NEGATIVE REPORT     Performed at [facility]   Report Status PENDING   Incomplete  CULTURE, BLOOD (ROUTINE X 2)     Status: None   Collection Time    04/17/14 10:25 AM      Result Value Ref Range Status   Specimen Description BLOOD LEFT HAND   Final   Special Requests BOTTLES DRAWN AEROBIC ONLY [facility]   Final   Culture  Setup Time     Final   Value: 04/18/2014 02:07     Performed at [facility]   Culture     Final   Value:        BLOOD CULTURE RECEIVED NO GROWTH TO DATE CULTURE WILL BE HELD FOR 5 DAYS BEFORE ISSUING A FINAL NEGATIVE REPORT     Performed at [facility]   Report Status PENDING   Incomplete     Studies: Dg Chest 2 View  04/18/2014   CLINICAL DATA:  Follow-up fusion.  Evaluate for pneumonia.  EXAM: CHEST  2 VIEW  COMPARISON:  04/17/2014  FINDINGS: Sequelae of prior CABG are again identified. Left-sided dual lead pacemaker remains in place. Cardiac silhouette remains enlarged. Thoracic aorta is again seen to be tortuous and calcified. There is persistent mild elevation of the right hemidiaphragm. Small right pleural effusion is again seen. Linear opacity in the right midlung is unchanged and most compatible with subsegmental atelectasis. There is new, mild opacity in the right upper lobe. 3 mm nodule in the left upper lobe is unchanged. No pneumothorax is identified. Degenerative changes are again noted about  both glenohumeral joints with old traumatic deformity of the proximal left humerus.  IMPRESSION: 1. New, ill-defined opacity in the right upper lobe, which may reflect developing infectious infiltrate. 2. Persistent small right pleural effusion. 3. Persistent right midlung subsegmental atelectasis.  Electronically Signed   By: Sebastian Ache   On: 04/18/2014 15:58    Scheduled Meds: . amiodarone  100 mg Oral Daily  . antiseptic oral rinse  7 mL Mouth Rinse BID  . aspirin EC  81 mg Oral Daily  . carvedilol  12.5 mg Oral BID WC  . ceFEPime (MAXIPIME) IV  500 mg Intravenous Daily  . Cholecalciferol  1,000 Units Oral Daily  . docusate sodium  200 mg Oral BID  . furosemide  40 mg Oral BID  . lactose free nutrition  237 mL Oral TID BM  . levothyroxine  88 mcg Oral QAC breakfast  . pantoprazole  40 mg Oral Daily  . sodium chloride  3 mL Intravenous Q12H  . tamsulosin  0.4 mg Oral Daily  . vancomycin  500 mg Intravenous Q24H   Continuous Infusions:   Principal Problem:   Acute on chronic systolic CHF (congestive heart failure) Active Problems:   HYPERLIPIDEMIA   HYPERTENSION   CAD- CABG in '04, cath 7/11 [place]   RENAL ARTERY STENOSIS   CKD stage 3, GFR 30-59 ml/min   Hypothyroidism   Chest pain   Renal insufficiency   Anemia   PNA (pneumonia)    Time spent: 30 minutes.     Hartley Barefoot A  [facility] Pager [phone]. If 7PM-7AM, please contact night-coverage at [email or web address], password [facility] 04/19/2014, 2:50 PM  LOS: 2 days

## 2014-04-19 NOTE — Evaluation (Signed)
Occupational Therapy Evaluation Patient Details Name: [name] [name] MRN: [id number] DOB: [birth year] Today's Date: 04/19/2014    History of Present Illness 80 y/o female admitted with SOB. PMH of CAD with prior bypass surgery in 2004, A-fib, DM, GERD, Right CEA 2004. Diagnosed with acute on chronic CHF, PNA. CXR with new, ill-defined opacity in the right upper lobe, which may reflect developing infectious infiltrate.    Clinical Impression   Pt admitted with above. She demonstrates the below listed deficits and will benefit from continued OT to maximize safety and independence with BADLs.  Pt presents to OT with generalized weakness. She is able to perform BADLs with min guard assist.  She indicates she has been to SNF level rehab several times and is adamant about returning home.  She states her children will be able to provide 24 hour assist at discharge, which I would recommend initially, then tapering down to intermittent assist if she is managing okay.  My concern is that she has had multiple admission in 6 mos.   ADDENDUM:  Patients daughters arrived.  They report that pt is not safe at home.  She is unable to manage her medications, and has taken large quantities of Xanax, Ativan, and pain killers in the past).  They also report she randomly doses herself with random medications.  They state she has fallen out of bed, but unsure if she has fallen while ambulating as she will not tell people if she has fallen.   They are unable to provide 24 hours assistance/superivision.  Based on this, recommend SNF, but unsure that pt will agree.       Follow Up Recommendations  SNF    Equipment Recommendations  None recommended by OT    Recommendations for Other Services       Precautions / Restrictions        Mobility Bed Mobility Overal bed mobility: Modified Independent                Transfers Overall transfer level: Needs assistance   Transfers: Sit to/from Stand;Stand Pivot  Transfers Sit to Stand: Supervision Stand pivot transfers: Min guard            Balance Overall balance assessment: Needs assistance Sitting-balance support: Feet supported Sitting balance-Leahy Scale: Good     Standing balance support: During functional activity Standing balance-Leahy Scale: Fair                              ADL Overall ADL's : Needs assistance/impaired Eating/Feeding: Independent   Grooming: Wash/dry hands;Wash/dry face;Brushing hair;Supervision/safety;Standing   Upper Body Bathing: Set up;Sitting   Lower Body Bathing: Min guard;Sit to/from stand   Upper Body Dressing : Set up;Sitting   Lower Body Dressing: Min guard;Sit to/from stand   Toilet Transfer: Min guard;Ambulation;Comfort height toilet   Toileting- Clothing Manipulation and Hygiene: Min guard;Sit to/from stand       Functional mobility during ADLs: Min guard;Rolling walker General ADL Comments: Pt is very motivated.  Min guard due to generalized weakness and balance deficits     Vision                     Perception     Praxis      Pertinent Vitals/Pain Pain Assessment: No/denies pain     Hand Dominance Right   Extremity/Trunk Assessment Upper Extremity Assessment Upper Extremity Assessment: LUE deficits/detail LUE Deficits / Details: Pt with h/o  rotator cuff injury with limited shoulder flexion to ~50*           Communication Communication Communication: No difficulties   Cognition Arousal/Alertness: Awake/alert Behavior During Therapy: WFL for tasks assessed/performed Overall Cognitive Status: Within Functional Limits for tasks assessed (no obvious cognitive deficits noted )                     General Comments       Exercises       Shoulder Instructions      Home Living Family/patient expects to be discharged to:: Private residence Living Arrangements: Alone Available Help at Discharge: Family;Available 24 hours/day;Available  PRN/intermittently Type of Home: House Home Access: Stairs to enter [employer] of Steps: 1 Entrance Stairs-Rails: Right;None Home Layout: One level     Bathroom Shower/Tub: Tub/shower unit;Curtain Shower/tub characteristics: Engineer, building services: Standard     Home Equipment: Environmental consultant - 2 wheels;Shower seat;Bedside commode;Cane - single point   Additional Comments: Pt reports she has 6 children who live in close proximity and can provide as much assistance as she needs at discharge.       Prior Functioning/Environment Level of Independence: Needs assistance  Gait / Transfers Assistance Needed: Pt reports she ambulates independently without AD.  Denies falls  ADL's / Homemaking Assistance Needed: Pt reports her daughter assists her with showers, and family assists her with grocery shopping         OT Diagnosis: Generalized weakness   OT Problem List: Decreased strength;Decreased activity tolerance;Impaired balance (sitting and/or standing);Decreased knowledge of use of DME or AE   OT Treatment/Interventions: Self-care/ADL training;DME and/or AE instruction;Therapeutic activities;Balance training;Patient/family education    OT Goals(Current goals can be found in the care plan section) Acute Rehab OT Goals Patient Stated Goal: to go home OT Goal Formulation: With patient Time For Goal Achievement: 05/03/14 Potential to Achieve Goals: Good ADL Goals Pt Will Perform Grooming: with modified independence;standing Pt Will Perform Lower Body Bathing: with modified independence;sit to/from stand Pt Will Perform Lower Body Dressing: with modified independence;sit to/from stand Pt Will Transfer to Toilet: with modified independence;ambulating;regular height toilet;grab bars;bedside commode Pt Will Perform Toileting - Clothing Manipulation and hygiene: with modified independence;sit to/from stand Pt Will Perform Tub/Shower Transfer: Tub transfer;with min guard  assist;ambulating;rolling walker  OT Frequency: Min 2X/week   Barriers to D/C:            Co-evaluation              End of Session Equipment Utilized During Treatment: Rolling walker Nurse Communication: Mobility status  Activity Tolerance: Patient tolerated treatment well Patient left: in chair;with call bell/phone within reach   Time: 1225-1252 OT Time Calculation (min): 27 min Charges:  OT General Charges $OT Visit: 1 Procedure OT Evaluation $Initial OT Evaluation Tier I: 1 Procedure OT Treatments $Self Care/Home Management : 8-22 mins $Therapeutic Activity: 8-22 mins G-Codes:    Taria Castrillo M 19-May-2014, 1:22 PM

## 2014-04-19 NOTE — Evaluation (Signed)
Physical Therapy Evaluation Patient Details Name: [name] [name] MRN: [id number] DOB: [birth year] Today's Date: 04/19/2014   History of Present Illness  80 y/o female admitted with SOB. PMH of CAD with prior bypass surgery in 2004, A-fib, DM, GERD, Right CEA 2004. Diagnosed with acute on chronic CHF, PNA. CXR with new, ill-defined opacity in the right upper lobe, which may reflect developing infectious infiltrate.    Clinical Impression  Patient presents with functional limitations due to deficits listed in PT problem list (see below). Pt with generalized weakness and mild balance deficits (when not using RW) limiting safe mobility. Concerned about pt being home alone due to safety concerns. Family seems to have concerns as well, per OT discussion/note. Would recommend supervision initially at home if possible until strength/mobility improve. Education provided to use RW for support at all times. Pt would benefit from skilled acute PT to improve transfers, gait and overall mobility so pt can maximize independence and decrease fall risk. If family not able to provide supervision initially at home, recommend ST SNF, however pt refusing.    Follow Up Recommendations Home health PT;Supervision/Assistance - 24 hour (Needs supervision at home initially.)    Equipment Recommendations  None recommended by PT    Recommendations for Other Services       Precautions / Restrictions Precautions Precautions: Fall Restrictions Weight Bearing Restrictions: No      Mobility  Bed Mobility Overal bed mobility: Modified Independent             General bed mobility comments: HOB flat, no rails. Increased time.   Transfers Overall transfer level: Needs assistance Equipment used: None Transfers: Sit to/from [employer] Sit to Stand: Min assist Stand pivot transfers: Min guard       General transfer comment: Ambulated to bathroom utilizing furniture for support. Required Min A  to stand from toilet due to low surface. At home pt reports having raised commode.   Ambulation/Gait Ambulation/Gait assistance: Min guard Ambulation Distance (Feet): 250 Feet Assistive device: Rolling walker (2 wheeled) Gait Pattern/deviations: Step-through pattern;Decreased stride length;Trunk flexed Gait velocity: 1.0 Gait velocity interpretation: <1.8 ft/sec, indicative of risk for recurrent falls General Gait Details: Unsteady without use of AD for support with pt reaching out to grab onto furniture for stability. Balance improved with RW for stability. No dyspnea present. Sa02 maintained >95% on RA post gait training.  Stairs            Wheelchair Mobility    Modified Rankin (Stroke Patients Only)       Balance Overall balance assessment: Needs assistance Sitting-balance support: Feet supported Sitting balance-Leahy Scale: Good Sitting balance - Comments: Able to donn socks EOB without assist, no LOB.   Standing balance support: During functional activity Standing balance-Leahy Scale: Fair Standing balance comment: Able to perform some dynamic standing- hand washing and peri care without UE support however requires UE support during longer distances due to balance deficits.                              Pertinent Vitals/Pain Pain Assessment: No/denies pain    Home Living Family/patient expects to be discharged to:: Private residence Living Arrangements: Alone Available Help at Discharge: Family;Available PRN/intermittently Type of Home: House Home Access: Stairs to enter Entrance Stairs-Rails: Right Entrance Stairs-Number of Steps: 1 Home Layout: One level Home Equipment: Walker - 2 wheels;Shower seat;Bedside commode;Cane - single point Additional Comments: Spoke with family and they  report that pt is unsafe to live at home.  She is unable to manage her medications, and tends to abuse Xanax, Ativan, and pain killers (per OT note, who spoke with  family)    Prior Function Level of Independence: Needs assistance   Gait / Transfers Assistance Needed: Pt reports she ambulates independently without AD.  Denies falls   ADL's / Homemaking Assistance Needed: Pt reports her daughter assists her with showers, and family assists her with grocery shopping   Comments: Pt reports no falls and is mainly independent within home environment. Per OT note, family seems concerned about pt's health and d/c plan.     Hand Dominance   Dominant Hand: Right    Extremity/Trunk Assessment   Upper Extremity Assessment: Defer to OT evaluation       LUE Deficits / Details: Pt with h/o rotator cuff injury with limited shoulder flexion to ~50*   Lower Extremity Assessment: Generalized weakness         Communication   Communication: No difficulties  Cognition Arousal/Alertness: Awake/alert Behavior During Therapy: WFL for tasks assessed/performed Overall Cognitive Status: Within Functional Limits for tasks assessed                      General Comments General comments (skin integrity, edema, etc.): O2 sats 97% on RA.  HR 78    Exercises        Assessment/Plan    PT Assessment Patient needs continued PT services  PT Diagnosis Generalized weakness;Difficulty walking   PT Problem List Decreased strength;Decreased safety awareness;Decreased balance;Decreased mobility  PT Treatment Interventions Balance training;Gait training;Patient/family education;Functional mobility training;Therapeutic activities;Therapeutic exercise;DME instruction   PT Goals (Current goals can be found in the Care Plan section) Acute Rehab PT Goals Patient Stated Goal: to go home PT Goal Formulation: With patient Time For Goal Achievement: 05/03/14 Potential to Achieve Goals: Good    Frequency Min 3X/week   Barriers to discharge Decreased caregiver support Pt lives alone.    Co-evaluation               End of Session Equipment Utilized  During Treatment: Gait belt Activity Tolerance: Patient tolerated treatment well Patient left: in bed;with call bell/phone within reach;with bed alarm set Nurse Communication: Mobility status         Time: 0712-1975 PT Time Calculation (min): 25 min   Charges:   PT Evaluation $Initial PT Evaluation Tier I: 1 Procedure PT Treatments $Gait Training: 8-22 mins   PT G CodesAlvie Heidelberg A 04/19/2014, 4:44 PM Alvie Heidelberg, PT, DPT [phone]

## 2014-04-19 NOTE — Progress Notes (Signed)
    SUBJECTIVE:  She is breathing better although not quite at baseline.     PHYSICAL EXAM Filed Vitals:   04/18/14 1952 04/19/14 0001 04/19/14 0335 04/19/14 0410  BP: 133/59 134/61 118/90   Pulse: 70 70 70   Temp: 98.2 F (36.8 C) 98 F (36.7 C) 98.1 F (36.7 C)   TempSrc: Oral Oral Oral   Resp: 23 20 26    Height:      Weight:    105 lb 9.6 oz (47.9 kg)  SpO2: 94% 94% 100%    General:  No distress Lungs:  Decreased breath sounds but no fine crackles Heart:  RRR Abdomen:  Positive bowel sounds, no rebound no guarding Extremities:  No edema   LABS:  Results for orders placed during the hospital encounter of 04/17/14 (from the past 24 hour(s))  BASIC METABOLIC PANEL     Status: Abnormal   Collection Time    04/19/14  3:22 AM      Result Value Ref Range   Sodium 136 (*) 137 - 147 mEq/L   Potassium 4.2  3.7 - 5.3 mEq/L   Chloride 98  96 - 112 mEq/L   CO2 27  19 - 32 mEq/L   Glucose, Bld 92  70 - 99 mg/dL   BUN 24 (*) 6 - 23 mg/dL   Creatinine, Ser 2.35 (*) 0.50 - 1.10 mg/dL   Calcium 8.8  8.4 - 57.3 mg/dL   GFR calc non Af Amer 32 (*) >90 mL/min   GFR calc Af Amer 37 (*) >90 mL/min   Anion gap 11  5 - 15  CBC     Status: Abnormal   Collection Time    04/19/14  3:22 AM      Result Value Ref Range   WBC 5.4  4.0 - 10.5 K/uL   RBC 3.87  3.87 - 5.11 MIL/uL   Hemoglobin 10.6 (*) 12.0 - 15.0 g/dL   HCT 22.0 (*) 25.4 - 27.0 %   MCV 87.6  78.0 - 100.0 fL   MCH 27.4  26.0 - 34.0 pg   MCHC 31.3  30.0 - 36.0 g/dL   RDW 62.3 (*) 76.2 - 83.1 %   Platelets 169  150 - 400 K/uL    Intake/Output Summary (Last 24 hours) at 04/19/14 5176 Last data filed at 04/19/14 0600  Gross per 24 hour  Intake    150 ml  Output   1350 ml  Net  -1200 ml     ASSESSMENT AND PLAN:  CHEST PAIN:  Nonanginal.  No further cardiac work up.    ATRIAL FIB:  AV paced rhythm.  She is not an anticoagulation candidate.   ACUTE ON CHRONIC SYSTOLIC AND DIASTOLIC HF:   EF 35% this admission.  No  change from previous.  She has moderate AI and MR.  I will change to PO Lasix.    [name] [facility] 04/19/2014 9:04 AM

## 2014-04-19 NOTE — Care Management Note (Addendum)
    Page 1 of 2   04/20/2014     2:30:46 PM CARE MANAGEMENT NOTE 04/20/2014  Patient:  [name] [name], [name] [name]   Account Number:  [id number]  Date Initiated:  04/19/2014  Documentation initiated by:  Vance Peper  Subjective/Objective Assessment:   Pt admitted for CHF.     Action/Plan:   CM to continue to monitor for disposition needs.   Anticipated DC Date:  04/21/2014   Anticipated DC Plan:  HOME W HOME HEALTH SERVICES      DC Planning Services  CM consult      Choice offered to / List presented to:          [facility] arranged  HH-1 RN  HH-2 PT  HH-10 DISEASE MANAGEMENT  HH-4 NURSE'S AIDE  HH-6 SOCIAL WORKER      HH agency  [facility]   Status of service:  Completed, signed off Medicare Important Message given?  YES (If response is "NO", the following Medicare IM given date fields will be blank) Date Medicare IM given:  04/19/2014 Medicare IM given by:  Vance Peper Date Additional Medicare IM given:  04/20/2014 Additional Medicare IM given by:  Ellen Goris  Discharge Disposition:  HOME W HOME HEALTH SERVICES  Per UR Regulation:  Reviewed for med. necessity/level of care/duration of stay  If discussed at Long Length of Stay Meetings, dates discussed:    Comments:  04/20/14 1400 [name] [name] J. Lucretia Roers, RN, BSN, [employer] [phone] Spoke with pt at bedside regarding discharge planning for [facility] as pt is not agreeable to SNF placement. Offered pt list of home health agencies to choose from.  Pt chose [facility] to render services. [name] notified.  No DME needs identified at this time.  NCM spoke to daughter [name] [name]) in length regarding pt decision.  Daughter woud like to see mom go to SNF/Rehab to get stronger; but realizes that pt decision is final.

## 2014-04-19 NOTE — Evaluation (Signed)
Clinical/Bedside Swallow Evaluation Patient Details  Name: [name] [name] MRN: [id number] Date of Birth: 25-Feb-1925  Today's Date: 04/19/2014 Time: 1153-1208 SLP Time Calculation (min): 15 min  Past Medical History:  Past Medical History  Diagnosis Date  . Diabetes mellitus without complication   . Chest pain   . DJD (degenerative joint disease)   . Tachycardia-bradycardia syndrome   . CKD (chronic kidney disease)   . Cardiomegaly   . Coronary artery disease     a. LAD stent 1999 after NSTEMI. b. CABG 2004. c. Last LHC 02/2010 - patent grafts.  . Hypertension   . Hyperlipidemia   . Carotid artery disease     status post right carotid endarterectomy.  . Degenerative joint disease   . Restless leg syndrome   . Chronic renal insufficiency   . Heparin induced thrombocytopenia   . Diphtheria     "as a child"  . Pleural effusion, bilateral 06/2003  . Pseudoaneurysm     status post repair following catherization in 1999  . Chronic systolic heart failure 10/02/11  . Ischemic cardiomyopathy     Echocardiogram 10/01/11: Mild LVH, EF 35-40%, basal inferior, basal to mid posterior and basal to mid anterolateral severe hypokinesis, mild to moderate AI, moderate MR (ischemic MR), moderate LAE, mild RVE, mildly reduced RV systolic function, mild RAE, PASP 43-44  . COPD (chronic obstructive pulmonary disease)   . Atrial fibrillation/flutter     s/p multiple DCCVs;  amiodarone started 09/2011, TEE DCCV 3/13;  amio and coumadin d/c'd after [facility] to [facility] 02/2012 with frequent falls/syncope  . Tachycardia-bradycardia syndrome     s/p Medtronic pacemaker implant 09/2011 (8 sec pause noted)  . GERD (gastroesophageal reflux disease)   . Renal artery stenosis     bilateral- status post stenting  . Non-functioning kidney   . Depression     "cries alot"  . Gout   . Hypothyroidism   . Anemia   . Pacemaker    Past Surgical History:  Past Surgical History  Procedure Laterality Date  . Pacemaker  insertion    . Carotid endarterectomy  05/2003    right  . Pacemaker insertion  10/02/11    implanted by Dr Johney Frame  . Cholecystectomy  ?02/2011  . Tonsillectomy and adenoidectomy    . Renal artery stent  06/2003    bilaterally/e-chart  . Thoracentesis  ? 2000; 05/2011  . Coronary angioplasty with stent placement  1999  . Av fistula repair  01/1998    w/pseudoaneurysm repair S/P catheterization/E-chart  . Coronary artery bypass graft  05/2003    CABG X 3  . Tee without cardioversion  10/23/2011    Procedure: TRANSESOPHAGEAL ECHOCARDIOGRAM (TEE);  Surgeon: Wendall Stade, MD;  Location: [facility] ENDOSCOPY;  Service: Cardiovascular;  Laterality: N/A;  . Cardioversion  10/23/2011    Procedure: CARDIOVERSION;  Surgeon: Wendall Stade, MD;  Location: [facility] ENDOSCOPY;  Service: Cardiovascular;  Laterality: N/A;   HPI:  80 y/o female admitted with SOB. PMH of CAD with prior bypass surgery in 2004, A-fib, DM, GERD, Right CEA 2004. Diagnosed with acute on chronic CHF, PNA. CXR with new, ill-defined opacity in the right upper lobe, which may reflect developing infectious infiltrate.   Assessment / Plan / Recommendation Clinical Impression  Swallow evaluation complete. Suspect a primary esophageal dysphagia characterized by c/o globus with dry and/or large bolus size without other evidence of an oropharyngeal dysphagia. Patient with known h/o GERD. While, if severe enough, GER could result in post prandial  aspiration, patient appears to be protecting airway at this time and does not report a h/o PNA. If PNA becomes recurrent in nature, instrumental testing may be beneficial. Education complete with patient regarding safe swallowing/esophageal precautions. Patient able to verbalize understanding. Defer further management of GERD-like symptoms to MD. No SLP f/u indicated at this time.     Aspiration Risk  Mild    Diet Recommendation Regular;Thin liquid   Liquid Administration via: Cup;Straw Medication  Administration: Whole meds with liquid Supervision: Patient able to self feed Compensations: Slow rate;Small sips/bites;Follow solids with liquid Postural Changes and/or Swallow Maneuvers: Seated upright 90 degrees;Upright 30-60 min after meal    Other  Recommendations Oral Care Recommendations: Oral care BID   Follow Up Recommendations  None       Pertinent Vitals/Pain n/a     Swallow Study    General HPI: 80 y/o female admitted with SOB. PMH of CAD with prior bypass surgery in 2004, A-fib, DM, GERD, Right CEA 2004. Diagnosed with acute on chronic CHF, PNA. CXR with new, ill-defined opacity in the right upper lobe, which may reflect developing infectious infiltrate. Type of Study: Bedside swallow evaluation Diet Prior to this Study: Regular;Thin liquids Temperature Spikes Noted: No Respiratory Status: Nasal cannula History of Recent Intubation: No Behavior/Cognition: Alert;Cooperative;Pleasant mood Oral Cavity - Dentition: Dentures, top (missing bottom dentition) Self-Feeding Abilities: Able to feed self Patient Positioning: Upright in bed Baseline Vocal Quality: Clear Volitional Cough: Strong Volitional Swallow: Able to elicit    Oral/Motor/Sensory Function Overall Oral Motor/Sensory Function: Appears within functional limits for tasks assessed (except mild lingual deviation to the right)   Ice Chips Ice chips: Not tested   Thin Liquid Thin Liquid: Within functional limits Presentation: Cup;Self Fed;Straw Other Comments: subtle wet vocal qualitiy with large consecutive sips cleared independently with second swallow    Nectar Thick Nectar Thick Liquid: Not tested   Honey Thick Honey Thick Liquid: Not tested   Puree Puree: Within functional limits Presentation: Self Fed;Spoon   Solid   GO   [name] Ellithorpe MA, CCC-SLP [phone]  Solid: Within functional limits Presentation: Self Fed       [name] [name] [name] [name] 04/19/2014,12:13 PM

## 2014-04-20 ENCOUNTER — Encounter (HOSPITAL_COMMUNITY): Payer: Self-pay | Admitting: Physician Assistant

## 2014-04-20 DIAGNOSIS — I2510 Atherosclerotic heart disease of native coronary artery without angina pectoris: Secondary | ICD-10-CM

## 2014-04-20 DIAGNOSIS — N183 Chronic kidney disease, stage 3 unspecified: Secondary | ICD-10-CM

## 2014-04-20 DIAGNOSIS — R0789 Other chest pain: Secondary | ICD-10-CM

## 2014-04-20 DIAGNOSIS — I5043 Acute on chronic combined systolic (congestive) and diastolic (congestive) heart failure: Principal | ICD-10-CM

## 2014-04-20 DIAGNOSIS — J189 Pneumonia, unspecified organism: Secondary | ICD-10-CM

## 2014-04-20 LAB — BASIC METABOLIC PANEL
Anion gap: 12 (ref 5–15)
BUN: 27 mg/dL — ABNORMAL HIGH (ref 6–23)
CALCIUM: 8.7 mg/dL (ref 8.4–10.5)
CO2: 30 mEq/L (ref 19–32)
Chloride: 92 mEq/L — ABNORMAL LOW (ref 96–112)
Creatinine, Ser: 1.19 mg/dL — ABNORMAL HIGH (ref 0.50–1.10)
GFR calc Af Amer: 47 mL/min — ABNORMAL LOW (ref >90)
GFR, EST NON AFRICAN AMERICAN: 40 mL/min — AB (ref >90)
GLUCOSE: 69 mg/dL — AB (ref 70–99)
Potassium: 3.6 mEq/L — ABNORMAL LOW (ref 3.7–5.3)
SODIUM: 134 meq/L — AB (ref 137–147)

## 2014-04-20 LAB — GLUCOSE, CAPILLARY: Glucose-Capillary: 154 mg/dL — ABNORMAL HIGH (ref 70–99)

## 2014-04-20 LAB — CBC
HCT: 32.5 % — ABNORMAL LOW (ref 36.0–46.0)
Hemoglobin: 10.4 g/dL — ABNORMAL LOW (ref 12.0–15.0)
MCH: 27.8 pg (ref 26.0–34.0)
MCHC: 32 g/dL (ref 30.0–36.0)
MCV: 86.9 fL (ref 78.0–100.0)
PLATELETS: 167 10*3/uL (ref 150–400)
RBC: 3.74 MIL/uL — ABNORMAL LOW (ref 3.87–5.11)
RDW: 16.5 % — AB (ref 11.5–15.5)
WBC: 5.6 10*3/uL (ref 4.0–10.5)

## 2014-04-20 MED ORDER — AMOXICILLIN-POT CLAVULANATE 500-125 MG PO TABS
1.0000 | ORAL_TABLET | Freq: Two times a day (BID) | ORAL | Status: DC
  Administered 2014-04-20: 500 mg via ORAL
  Filled 2014-04-20 (×2): qty 1

## 2014-04-20 MED ORDER — POTASSIUM CHLORIDE CRYS ER 20 MEQ PO TBCR
40.0000 meq | EXTENDED_RELEASE_TABLET | Freq: Once | ORAL | Status: AC
  Administered 2014-04-20: 40 meq via ORAL

## 2014-04-20 MED ORDER — VITAMIN D3 25 MCG (1000 UNIT) PO TABS
1000.0000 [IU] | ORAL_TABLET | Freq: Every day | ORAL | Status: DC
  Administered 2014-04-20: 1000 [IU] via ORAL
  Filled 2014-04-20: qty 1

## 2014-04-20 MED ORDER — AMOXICILLIN-POT CLAVULANATE 500-125 MG PO TABS: 1.0000 | ORAL_TABLET | Freq: Two times a day (BID) | ORAL | Status: DC

## 2014-04-20 MED ORDER — AMOXICILLIN-POT CLAVULANATE 875-125 MG PO TABS: 1.0000 | ORAL_TABLET | Freq: Two times a day (BID) | ORAL | Status: DC

## 2014-04-20 NOTE — Progress Notes (Signed)
Patient: [name] [name] / Admit Date: 04/17/2014 / Date of Encounter: 04/20/2014, 10:45 AM   Subjective: Feels good. No chest pain, palpitations, SOB, or dyspnea. Wants to go home.    Objective: Telemetry: AV paced, 70 Physical Exam: Blood pressure 129/55, pulse 69, temperature 98.6 F (37 C), temperature source Oral, resp. rate 20, height  (1.626 m), weight 103 lb 6.3 oz (46.9 kg), SpO2 100.00%. General: Well developed, well nourished, in no acute distress. Head: Normocephalic, atraumatic, sclera non-icteric, no xanthomas, nares are without discharge. Neck: Negative for carotid bruits. JVP not elevated. Lungs: Decreased breath sounds without wheezes, rales, or rhonchi. Breathing is unlabored. Heart: RRR S1 S2, 2/6 systolic murmur. No rubs or gallops.  Abdomen: Soft, non-tender, non-distended with normoactive bowel sounds. No rebound/guarding. Extremities: No clubbing or cyanosis. No edema. Distal pedal pulses are 2+ and equal bilaterally. Neuro: Alert and oriented X 3. Moves all extremities spontaneously. Psych:  Responds to questions appropriately with a normal affect.   Intake/Output Summary (Last 24 hours) at 04/20/14 1045 Last data filed at 04/20/14 0840  Gross per 24 hour  Intake    360 ml  Output    600 ml  Net   -240 ml    Inpatient Medications:  . amiodarone  100 mg Oral Daily  . amoxicillin-clavulanate  1 tablet Oral BID  . antiseptic oral rinse  7 mL Mouth Rinse BID  . aspirin EC  81 mg Oral Daily  . carvedilol  12.5 mg Oral BID WC  . Cholecalciferol  1,000 Units Oral Daily  . docusate sodium  200 mg Oral BID  . furosemide  40 mg Oral BID  . lactose free nutrition  237 mL Oral TID BM  . levothyroxine  88 mcg Oral QAC breakfast  . pantoprazole  40 mg Oral Daily  . sodium chloride  3 mL Intravenous Q12H  . tamsulosin  0.4 mg Oral Daily   Infusions:    Labs:  Recent Labs  04/19/14 0322 04/20/14 0445  NA 136* 134*  K 4.2 3.6*  CL 98 92*  CO2 27  30  GLUCOSE 92 69*  BUN 24* 27*  CREATININE 1.44* 1.19*  CALCIUM 8.8 8.7    Recent Labs  04/19/14 0322 04/20/14 0445  WBC 5.4 5.6  HGB 10.6* 10.4*  HCT 33.9* 32.5*  MCV 87.6 86.9  PLT 169 167    Recent Labs  04/17/14 1420 04/17/14 1930  TROPONINI <0.30 <0.30   Radiology/Studies:  Dg Chest 2 View  04/18/2014   CLINICAL DATA:  Follow-up fusion.  Evaluate for pneumonia.  IMPRESSION: 1. New, ill-defined opacity in the right upper lobe, which may reflect developing infectious infiltrate. 2. Persistent small right pleural effusion. 3. Persistent right midlung subsegmental atelectasis.   Electronically     Assessment and Plan   80 y.o. female with h/o CAD s/p CABG 2004 (LHC 2011 patent grafts), ICM, chronic systolic CHF, a-fib/flutter s/p multiple DCCVs (bot on AC 2/2 frequent falls), tachy-brady syndrome with 8 second pause s/p Medtronic PPM 09/2011, CKD stage III, carotid artery disease who [facility]17[facility]14 with 4 days of increased SOB and chest pain and was found to have acute on chronic combined systolic and diastolic CHF/PNA.   1. Acute on chronic combined systolic and diastolic CHF: -Diuresed 2 L since admission -Changed to PO Lasix 40 bid on 9/7, SCr improved to 1.19 today from 1.44 on 9/7 (home dose 40 mg daily) -EF 35%, no change from previous,  moderate AI and MR -Stable from cardiology standpoint   2. Chronic a-fib: -AV paced rhythm -Not on AC 2/2 increased fall risk -Continue amiodarone 100 mg daily and Coreg 12.5 mg bid  3. Chest pain:  -TnI negative x 4 -Non anginal, no further cardiac work up  4. CAD s/p CABG 2004 (LHC 2011 patent graft)/ICM -Aspirin 81 mg, Coreg 12.5 mg bid  5. CKD stage III: -Stable -Not on ACEi currently -Consider hydralazine/nitrates in future  6. PNA:  -CXR 9/6, new, ill-defined opacity in right upper lobe, which may reflect infectious infiltrate  -On Augmentin  -Per IM  Signed, Eula Listen, PA-C 04/20/2014 11:06  AM   The patient was seen, examined and discussed with Eula Listen, PA-C and I agree with the above.   80 year old female with admitted with acute on chronic combined CHF, and acute on chronic CKD. She responded well to diuresis, currently - 4 lbs from admission (103 lbs, 1 year ago 110 lbs) with symptoms improvement. We would recommend to continue home dose Lasix 40 mg po daily. CHF exacerbation possibly due to pneumonia. She is being treated for pneumonia and will continue Augmentin on outpatient basis. Follow up with Dr Antoine Poche.   Lars Masson 04/20/2014

## 2014-04-20 NOTE — Discharge Summary (Signed)
Physician Discharge Summary  [name] [name] ZOX:[id number] DOB: 06-05-1925 DOA: 04/17/2014  PCP: Josue Hector, MD  Admit date: 04/17/2014 Discharge date: 04/20/2014  Time spent: 35 minutes  Recommendations for Outpatient Follow-up:  1. Needs follow up with cardiologist, follow eight, adjust lasix as needed. 2. Follow resolution of PNA.   Discharge Diagnoses:    Acute on chronic systolic CHF (congestive heart failure)    PNA (pneumonia)   HYPERLIPIDEMIA   HYPERTENSION   CAD- CABG in '04, cath 7/11 OK   RENAL ARTERY STENOSIS   CKD stage 3, GFR 30-59 ml/min   Hypothyroidism   Chest pain   Renal insufficiency   Anemia    Discharge Condition: stable.   Diet recommendation: Heart HEalthy  Filed Weights   04/19/14 0410 04/19/14 1344 04/20/14 0541  Weight: 47.9 kg (105 lb 9.6 oz) 46.1 kg (101 lb 10.1 oz) 46.9 kg (103 lb 6.3 oz)    History of present illness:  [name] [name] is a 80 y.o. female with Multiple medical Problems who presents to the ED with complaints of worsening SOB since 4 AM. She has had Dysnea, and DOE, and Orthopnea. She reports having chest pain off and on x 2 weeks. In the ED she was found to have a BNP of 30K, and her baseline BNP runs around 6k. She was administered 60 mg of IV lasix x 1 and referred for medical admission. Her initial cardiac workup was negative.    Hospital Course:  1-Acute on chronic systolic and diastolic CHF (congestive heart failure, Last EF 35 %  She was treated with asix to 40 Mg IV BID, subsequently she was transition to oral lasix Continue with coreg.  ECHO Ef 35 %.  Cardiology following.  Weight: 49---50---47---46  Strict I and O : Negative 2 L.  Patient will be discharge on home dose lasix.   PNA, Health care associated.  Reports chills, cough, chest x ray with linear atelectasis.  blood culture, sputum culture pending.  Continue with Vancomycin and cefepime day 4.  Chest x ray confirm PNA, new, ill-defined opacity in  the right upper lobe, which may reflect developing infectious infiltrate.  Patient will be discharge on Augmentin fo 7 days. didn't prescribe Levaquin to avoid QT prolong due to concomitant use of amiodarone.   Chest pain: Troponin negative, times 3. PRN nitroglycerin. Coreg, aspirin,. LDL 22.   Atrial fibrillation  On amiodarone, and Carvedilol for Rate and Rhythm Control   RENAL ARTERY STENOSIS  No ACe Inhibitors   CKD stage 3, GFR 30-59 ml/min  Monitor BUN/Cr, Cr decrease to 1.4.  Would monitor on lasix.   Hypothyroidism  Continue Levothyroxine  PT recommend SNF. Patient decline SNF. Patient has capacity. Family aware. Family will need to provide 24 hours care and assistance. I have order a nurse to help with medications, and SW.   Procedures: ECHO: Left ventricle: The cavity size was normal. Wall thickness was increased in a pattern of mild LVH. Systolic function was moderately to severely reduced. The estimated ejection fraction was in the range of 30% to 35%. Diffuse hypokinesis. There is akinesis of the inferolateral myocardium. The study is not technically sufficient to allow evaluation of LV diastolic function.    Consultations:  Cardiology  Discharge Exam: Filed Vitals:   04/20/14 0541  BP: 129/55  Pulse: 69  Temp: 98.6 F (37 C)  Resp: 20    General: Alert in no distress.  Cardiovascular: S 1, S 2 RRR Respiratory: CTA  Discharge  Instructions You were cared for by a hospitalist during your hospital stay. If you have any questions about your discharge medications or the care you received while you were in the hospital after you are discharged, you can call the unit and asked to speak with the hospitalist on call if the hospitalist that took care of you is not available. Once you are discharged, your primary care physician will handle any further medical issues. Please note that NO REFILLS for any discharge medications will be authorized once you are  discharged, as it is imperative that you return to your primary care physician (or establish a relationship with a primary care physician if you do not have one) for your aftercare needs so that they can reassess your need for medications and monitor your lab values.  Discharge Instructions   Diet - low sodium heart healthy    Complete by:  As directed      Increase activity slowly    Complete by:  As directed           Current Discharge Medication List    START taking these medications   Details  amoxicillin-clavulanate (AUGMENTIN) 500-125 MG per tablet Take 1 tablet (500 mg total) by mouth 2 (two) times daily. Qty: 14 tablet, Refills: 0      CONTINUE these medications which have NOT CHANGED   Details  amiodarone (PACERONE) 200 MG tablet Take 100 mg by mouth daily.    aspirin EC 81 MG tablet Take 81 mg by mouth daily.    carvedilol (COREG) 12.5 MG tablet Take 1 tablet (12.5 mg total) by mouth 2 (two) times daily with a meal. Qty: 60 tablet, Refills: 6    cyanocobalamin (,VITAMIN B-12,) 1000 MCG/ML injection Inject 1,000 mcg into the muscle every 30 (thirty) days. Done at Dr. Joyce Copa office    docusate sodium (COLACE) 100 MG capsule Take 100 mg by mouth daily.     furosemide (LASIX) 40 MG tablet Take 40 mg by mouth daily.     HYDROcodone-acetaminophen (NORCO) 10-325 MG per tablet Take 1 tablet by mouth every 6 (six) hours. scheduled Qty: 30 tablet, Refills: 0    isosorbide mononitrate (IMDUR) 15 mg TB24 24 hr tablet Take 0.5 tablets (15 mg total) by mouth daily. Qty: 30 tablet, Refills: 3    lactose free nutrition (BOOST) LIQD Take 237 mLs by mouth 3 (three) times daily between meals.    levothyroxine (SYNTHROID, LEVOTHROID) 88 MCG tablet Take 88 mcg by mouth daily before breakfast.    LORazepam (ATIVAN) 0.5 MG tablet Take 0.5 mg by mouth daily.    omeprazole (PRILOSEC) 40 MG capsule Take 1 capsule (40 mg total) by mouth daily. Qty: 30 capsule, Refills: 11     potassium chloride (K-DUR,KLOR-CON) 10 MEQ tablet Take 10 mEq by mouth daily.    Tamsulosin HCl (FLOMAX) 0.4 MG CAPS Take 1 capsule (0.4 mg total) by mouth daily. Qty: 30 capsule, Refills: 0    nitroGLYCERIN (NITROSTAT) 0.4 MG SL tablet Place 0.4 mg under the tongue every 5 (five) minutes as needed for chest pain.       Allergies  Allergen Reactions  . Heparin Other (See Comments)    Clots blood instead of thinning  . Hydromorphone Hcl Other (See Comments)    Crazy feeling  . Warfarin And Related Other (See Comments)    Syncope events  . Altace [Ramipril] Hives  . Contrast Media [Iodinated Diagnostic Agents] Other (See Comments)    Reaction noted by md-  might have caused aneurism in 1999  . Morphine And Related Nausea And Vomiting  . Plavix [Clopidogrel Bisulfate] Hives and Itching   Follow-up Information   Follow up with Rollene Rotunda, MD On 04/28/2014. (APPT: 4 PM)    Specialty:  Cardiology   Contact information:   [address] [place] [address] [phone]       Follow up with Josue Hector, MD.   Specialty:  Family Medicine   Contact information:   [address] [place] [address] [phone]        The results of significant diagnostics from this hospitalization (including imaging, microbiology, ancillary and laboratory) are listed below for reference.    Significant Diagnostic Studies: Dg Chest 2 View  04/18/2014   CLINICAL DATA:  Follow-up fusion.  Evaluate for pneumonia.  EXAM: CHEST  2 VIEW  COMPARISON:  04/17/2014  FINDINGS: Sequelae of prior CABG are again identified. Left-sided dual lead pacemaker remains in place. Cardiac silhouette remains enlarged. Thoracic aorta is again seen to be tortuous and calcified. There is persistent mild elevation of the right hemidiaphragm. Small right pleural effusion is again seen. Linear opacity in the right midlung is unchanged and most compatible with subsegmental atelectasis. There is new, mild opacity  in the right upper lobe. 3 mm nodule in the left upper lobe is unchanged. No pneumothorax is identified. Degenerative changes are again noted about both glenohumeral joints with old traumatic deformity of the proximal left humerus.  IMPRESSION: 1. New, ill-defined opacity in the right upper lobe, which may reflect developing infectious infiltrate. 2. Persistent small right pleural effusion. 3. Persistent right midlung subsegmental atelectasis.   Electronically Signed   By: Sebastian Ache   On: 04/18/2014 15:58   Dg Chest Port 1 View  04/17/2014   CLINICAL DATA:  Shortness of breath  EXAM: PORTABLE CHEST - 1 VIEW  COMPARISON:  02/07/2014  FINDINGS: Cardiac pacemaker. Postoperative change in the mediastinum. Shallow inspiration with elevation of right hemidiaphragm. Interval development of linear atelectasis in the right mid lung with small right pleural effusion. Probable emphysematous changes in the lungs. Calcified and tortuous aorta. Degenerative changes in the spine and shoulders.  IMPRESSION: Developing linear atelectasis in the right mid lung and small right pleural effusion.   Electronically Signed   By: Burman Nieves M.D.   On: 04/17/2014 05:24    Microbiology: Recent Results (from the past 240 hour(s))  MRSA PCR SCREENING     Status: None   Collection Time    04/17/14  8:37 AM      Result Value Ref Range Status   MRSA by PCR NEGATIVE  NEGATIVE Final   Comment:            The GeneXpert MRSA Assay (FDA     approved for NASAL specimens     only), is one component of a     comprehensive MRSA colonization     surveillance program. It is not     intended to diagnose MRSA     infection nor to guide or     monitor treatment for     MRSA infections.  CULTURE, BLOOD (ROUTINE X 2)     Status: None   Collection Time    04/17/14 10:20 AM      Result Value Ref Range Status   Specimen Description BLOOD RIGHT ARM   Final   Special Requests BOTTLES DRAWN AEROBIC AND ANAEROBIC 10CC   Final    Culture  Setup Time  Final   Value: 04/18/2014 02:07     Performed at [facility]   Culture     Final   Value:        BLOOD CULTURE RECEIVED NO GROWTH TO DATE CULTURE WILL BE HELD FOR 5 DAYS BEFORE ISSUING A FINAL NEGATIVE REPORT     Performed at [facility]   Report Status PENDING   Incomplete  CULTURE, BLOOD (ROUTINE X 2)     Status: None   Collection Time    04/17/14 10:25 AM      Result Value Ref Range Status   Specimen Description BLOOD LEFT HAND   Final   Special Requests BOTTLES DRAWN AEROBIC ONLY 7CC   Final   Culture  Setup Time     Final   Value: 04/18/2014 02:07     Performed at [facility]   Culture     Final   Value:        BLOOD CULTURE RECEIVED NO GROWTH TO DATE CULTURE WILL BE HELD FOR 5 DAYS BEFORE ISSUING A FINAL NEGATIVE REPORT     Performed at [facility]   Report Status PENDING   Incomplete     Labs: Basic Metabolic Panel:  Recent Labs Lab 04/17/14 0447 04/18/14 0344 04/19/14 0322 04/20/14 0445  NA 138 138 136* 134*  K 3.4* 4.0 4.2 3.6*  CL 99 100 98 92*  CO2 24 26 27 30   GLUCOSE 106* 89 92 69*  BUN 24* 25* 24* 27*  CREATININE 1.47* 1.50* 1.44* 1.19*  CALCIUM 8.9 8.5 8.8 8.7   Liver Function Tests:  Recent Labs Lab 04/17/14 0447  AST 15  ALT 6  ALKPHOS 66  BILITOT 0.9  PROT 6.5  ALBUMIN 3.4*   No results found for this basename: LIPASE, AMYLASE,  in the last 168 hours No results found for this basename: AMMONIA,  in the last 168 hours CBC:  Recent Labs Lab 04/17/14 0447 04/18/14 0344 04/19/14 0322 04/20/14 0445  WBC 5.0 4.8 5.4 5.6  NEUTROABS 3.5  --   --   --   HGB 11.1* 10.5* 10.6* 10.4*  HCT 35.1* 33.1* 33.9* 32.5*  MCV 89.8 89.7 87.6 86.9  PLT 171 161 169 167   Cardiac Enzymes:  Recent Labs Lab 04/17/14 0447 04/17/14 0902 04/17/14 1420 04/17/14 1930  TROPONINI <0.30 <0.30 <0.30 <0.30   BNP: BNP (last 3 results)  Recent Labs  01/26/14 1030 04/17/14 0447  PROBNP 9027.0*  30005.0*   CBG:  Recent Labs Lab 04/20/14 0856  GLUCAP 154*       Signed:  Arturo Freundlich A  [facility] 04/20/2014, 2:00 PM

## 2014-04-20 NOTE — Progress Notes (Signed)
IV leaking, removed. Did not insert another IV because of possible discharge.   Danney Bungert A, RN 11:08 AM 11:08 AM
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
## 2014-04-20 NOTE — Progress Notes (Signed)
Pt given DC instructions and verbalized understanding.  Pt DC home via wc with son.

## 2014-04-24 LAB — CULTURE, BLOOD (ROUTINE X 2)
CULTURE: NO GROWTH
Culture: NO GROWTH

## 2014-04-28 ENCOUNTER — Encounter: Payer: Self-pay | Admitting: Cardiology

## 2014-05-20 ENCOUNTER — Inpatient Hospital Stay (HOSPITAL_COMMUNITY)
Admission: EM | Admit: 2014-05-20 | Discharge: 2014-05-23 | DRG: 194 | Disposition: A | Discharge status: Home / Self Care | Payer: Medicare Other | Attending: Internal Medicine | Admitting: Internal Medicine

## 2014-05-20 ENCOUNTER — Encounter (HOSPITAL_COMMUNITY): Payer: Self-pay | Admitting: Emergency Medicine

## 2014-05-20 ENCOUNTER — Emergency Department (HOSPITAL_COMMUNITY): Payer: Medicare Other

## 2014-05-20 DIAGNOSIS — F419 Anxiety disorder, unspecified: Secondary | ICD-10-CM

## 2014-05-20 DIAGNOSIS — I2510 Atherosclerotic heart disease of native coronary artery without angina pectoris: Secondary | ICD-10-CM | POA: Diagnosis present

## 2014-05-20 DIAGNOSIS — M79609 Pain in unspecified limb: Secondary | ICD-10-CM

## 2014-05-20 DIAGNOSIS — R269 Unspecified abnormalities of gait and mobility: Secondary | ICD-10-CM

## 2014-05-20 DIAGNOSIS — R1314 Dysphagia, pharyngoesophageal phase: Secondary | ICD-10-CM

## 2014-05-20 DIAGNOSIS — Z955 Presence of coronary angioplasty implant and graft: Secondary | ICD-10-CM | POA: Diagnosis not present

## 2014-05-20 DIAGNOSIS — J440 Chronic obstructive pulmonary disease with acute lower respiratory infection: Secondary | ICD-10-CM | POA: Diagnosis present

## 2014-05-20 DIAGNOSIS — I5023 Acute on chronic systolic (congestive) heart failure: Secondary | ICD-10-CM

## 2014-05-20 DIAGNOSIS — N183 Chronic kidney disease, stage 3 unspecified: Secondary | ICD-10-CM | POA: Diagnosis present

## 2014-05-20 DIAGNOSIS — I255 Ischemic cardiomyopathy: Secondary | ICD-10-CM | POA: Diagnosis present

## 2014-05-20 DIAGNOSIS — E039 Hypothyroidism, unspecified: Secondary | ICD-10-CM

## 2014-05-20 DIAGNOSIS — J9811 Atelectasis: Secondary | ICD-10-CM | POA: Diagnosis present

## 2014-05-20 DIAGNOSIS — M109 Gout, unspecified: Secondary | ICD-10-CM | POA: Diagnosis present

## 2014-05-20 DIAGNOSIS — I4891 Unspecified atrial fibrillation: Secondary | ICD-10-CM

## 2014-05-20 DIAGNOSIS — J189 Pneumonia, unspecified organism: Secondary | ICD-10-CM | POA: Diagnosis not present

## 2014-05-20 DIAGNOSIS — E785 Hyperlipidemia, unspecified: Secondary | ICD-10-CM | POA: Diagnosis present

## 2014-05-20 DIAGNOSIS — G2581 Restless legs syndrome: Secondary | ICD-10-CM | POA: Diagnosis present

## 2014-05-20 DIAGNOSIS — R000 Tachycardia, unspecified: Secondary | ICD-10-CM | POA: Diagnosis present

## 2014-05-20 DIAGNOSIS — I5043 Acute on chronic combined systolic (congestive) and diastolic (congestive) heart failure: Secondary | ICD-10-CM

## 2014-05-20 DIAGNOSIS — K219 Gastro-esophageal reflux disease without esophagitis: Secondary | ICD-10-CM | POA: Diagnosis present

## 2014-05-20 DIAGNOSIS — I5022 Chronic systolic (congestive) heart failure: Secondary | ICD-10-CM | POA: Diagnosis present

## 2014-05-20 DIAGNOSIS — R001 Bradycardia, unspecified: Secondary | ICD-10-CM

## 2014-05-20 DIAGNOSIS — I480 Paroxysmal atrial fibrillation: Secondary | ICD-10-CM | POA: Diagnosis present

## 2014-05-20 DIAGNOSIS — Z950 Presence of cardiac pacemaker: Secondary | ICD-10-CM | POA: Diagnosis not present

## 2014-05-20 DIAGNOSIS — R69 Illness, unspecified: Secondary | ICD-10-CM | POA: Diagnosis present

## 2014-05-20 DIAGNOSIS — M1990 Unspecified osteoarthritis, unspecified site: Secondary | ICD-10-CM | POA: Diagnosis present

## 2014-05-20 DIAGNOSIS — R21 Rash and other nonspecific skin eruption: Secondary | ICD-10-CM

## 2014-05-20 DIAGNOSIS — I495 Sick sinus syndrome: Secondary | ICD-10-CM

## 2014-05-20 DIAGNOSIS — R0609 Other forms of dyspnea: Secondary | ICD-10-CM

## 2014-05-20 DIAGNOSIS — I10 Essential (primary) hypertension: Secondary | ICD-10-CM | POA: Diagnosis present

## 2014-05-20 DIAGNOSIS — N390 Urinary tract infection, site not specified: Secondary | ICD-10-CM

## 2014-05-20 DIAGNOSIS — R339 Retention of urine, unspecified: Secondary | ICD-10-CM

## 2014-05-20 DIAGNOSIS — F329 Major depressive disorder, single episode, unspecified: Secondary | ICD-10-CM | POA: Diagnosis present

## 2014-05-20 DIAGNOSIS — D519 Vitamin B12 deficiency anemia, unspecified: Secondary | ICD-10-CM

## 2014-05-20 DIAGNOSIS — R7401 Elevation of levels of liver transaminase levels: Secondary | ICD-10-CM

## 2014-05-20 DIAGNOSIS — I129 Hypertensive chronic kidney disease with stage 1 through stage 4 chronic kidney disease, or unspecified chronic kidney disease: Secondary | ICD-10-CM | POA: Diagnosis present

## 2014-05-20 DIAGNOSIS — E119 Type 2 diabetes mellitus without complications: Secondary | ICD-10-CM | POA: Diagnosis present

## 2014-05-20 DIAGNOSIS — Z681 Body mass index (BMI) 19 or less, adult: Secondary | ICD-10-CM | POA: Diagnosis not present

## 2014-05-20 DIAGNOSIS — I509 Heart failure, unspecified: Secondary | ICD-10-CM

## 2014-05-20 DIAGNOSIS — E440 Moderate protein-calorie malnutrition: Secondary | ICD-10-CM | POA: Diagnosis present

## 2014-05-20 DIAGNOSIS — I080 Rheumatic disorders of both mitral and aortic valves: Secondary | ICD-10-CM

## 2014-05-20 DIAGNOSIS — Z951 Presence of aortocoronary bypass graft: Secondary | ICD-10-CM

## 2014-05-20 DIAGNOSIS — R531 Weakness: Secondary | ICD-10-CM

## 2014-05-20 DIAGNOSIS — E43 Unspecified severe protein-calorie malnutrition: Secondary | ICD-10-CM

## 2014-05-20 DIAGNOSIS — I429 Cardiomyopathy, unspecified: Secondary | ICD-10-CM

## 2014-05-20 DIAGNOSIS — N939 Abnormal uterine and vaginal bleeding, unspecified: Secondary | ICD-10-CM

## 2014-05-20 DIAGNOSIS — R1319 Other dysphagia: Secondary | ICD-10-CM | POA: Diagnosis present

## 2014-05-20 DIAGNOSIS — D696 Thrombocytopenia, unspecified: Secondary | ICD-10-CM | POA: Diagnosis present

## 2014-05-20 DIAGNOSIS — N289 Disorder of kidney and ureter, unspecified: Secondary | ICD-10-CM

## 2014-05-20 DIAGNOSIS — D649 Anemia, unspecified: Secondary | ICD-10-CM | POA: Diagnosis present

## 2014-05-20 DIAGNOSIS — I701 Atherosclerosis of renal artery: Secondary | ICD-10-CM | POA: Diagnosis present

## 2014-05-20 DIAGNOSIS — R55 Syncope and collapse: Secondary | ICD-10-CM

## 2014-05-20 DIAGNOSIS — R1310 Dysphagia, unspecified: Secondary | ICD-10-CM | POA: Diagnosis present

## 2014-05-20 DIAGNOSIS — I517 Cardiomegaly: Secondary | ICD-10-CM | POA: Diagnosis present

## 2014-05-20 DIAGNOSIS — Z66 Do not resuscitate: Secondary | ICD-10-CM | POA: Diagnosis present

## 2014-05-20 DIAGNOSIS — I208 Other forms of angina pectoris: Secondary | ICD-10-CM

## 2014-05-20 DIAGNOSIS — R600 Localized edema: Secondary | ICD-10-CM

## 2014-05-20 DIAGNOSIS — R52 Pain, unspecified: Secondary | ICD-10-CM

## 2014-05-20 DIAGNOSIS — R740 Nonspecific elevation of levels of transaminase and lactic acid dehydrogenase [LDH]: Secondary | ICD-10-CM

## 2014-05-20 DIAGNOSIS — R0902 Hypoxemia: Secondary | ICD-10-CM

## 2014-05-20 DIAGNOSIS — R0602 Shortness of breath: Secondary | ICD-10-CM | POA: Diagnosis not present

## 2014-05-20 LAB — COMPREHENSIVE METABOLIC PANEL
ALBUMIN: 3.2 g/dL — AB (ref 3.5–5.2)
ALT: 6 U/L (ref 0–35)
ANION GAP: 14 (ref 5–15)
AST: 15 U/L (ref 0–37)
Alkaline Phosphatase: 77 U/L (ref 39–117)
BUN: 26 mg/dL — AB (ref 6–23)
CALCIUM: 8.8 mg/dL (ref 8.4–10.5)
CO2: 22 mEq/L (ref 19–32)
Chloride: 100 mEq/L (ref 96–112)
Creatinine, Ser: 1.53 mg/dL — ABNORMAL HIGH (ref 0.50–1.10)
GFR calc non Af Amer: 30 mL/min — ABNORMAL LOW (ref >90)
GFR, EST AFRICAN AMERICAN: 34 mL/min — AB (ref >90)
Glucose, Bld: 112 mg/dL — ABNORMAL HIGH (ref 70–99)
Potassium: 4.4 mEq/L (ref 3.7–5.3)
Sodium: 136 mEq/L — ABNORMAL LOW (ref 137–147)
TOTAL PROTEIN: 6.5 g/dL (ref 6.0–8.3)
Total Bilirubin: 0.8 mg/dL (ref 0.3–1.2)

## 2014-05-20 LAB — CBC WITH DIFFERENTIAL/PLATELET
BASOS PCT: 1 % (ref 0–1)
Basophils Absolute: 0 10*3/uL (ref 0.0–0.1)
EOS PCT: 2 % (ref 0–5)
Eosinophils Absolute: 0.1 10*3/uL (ref 0.0–0.7)
HCT: 33.4 % — ABNORMAL LOW (ref 36.0–46.0)
Hemoglobin: 10.8 g/dL — ABNORMAL LOW (ref 12.0–15.0)
LYMPHS ABS: 1 10*3/uL (ref 0.7–4.0)
Lymphocytes Relative: 21 % (ref 12–46)
MCH: 27.2 pg (ref 26.0–34.0)
MCHC: 32.3 g/dL (ref 30.0–36.0)
MCV: 84.1 fL (ref 78.0–100.0)
MONOS PCT: 10 % (ref 3–12)
Monocytes Absolute: 0.5 10*3/uL (ref 0.1–1.0)
Neutro Abs: 3.1 10*3/uL (ref 1.7–7.7)
Neutrophils Relative %: 66 % (ref 43–77)
PLATELETS: 165 10*3/uL (ref 150–400)
RBC: 3.97 MIL/uL (ref 3.87–5.11)
RDW: 18.6 % — ABNORMAL HIGH (ref 11.5–15.5)
WBC: 4.7 10*3/uL (ref 4.0–10.5)

## 2014-05-20 LAB — TROPONIN I

## 2014-05-20 LAB — PRO B NATRIURETIC PEPTIDE: Pro B Natriuretic peptide (BNP): 26328 pg/mL — ABNORMAL HIGH (ref 0–450)

## 2014-05-20 MED ORDER — NITROGLYCERIN 0.4 MG SL SUBL: 0.4000 mg | SUBLINGUAL_TABLET | SUBLINGUAL | Status: DC | PRN

## 2014-05-20 MED ORDER — CARVEDILOL 12.5 MG PO TABS
12.5000 mg | ORAL_TABLET | Freq: Two times a day (BID) | ORAL | Status: DC
  Administered 2014-05-21 – 2014-05-23 (×5): 12.5 mg via ORAL
  Filled 2014-05-20 (×7): qty 1

## 2014-05-20 MED ORDER — LEVOTHYROXINE SODIUM 88 MCG PO TABS
88.0000 ug | ORAL_TABLET | Freq: Every day | ORAL | Status: DC
  Administered 2014-05-21 – 2014-05-23 (×3): 88 ug via ORAL
  Filled 2014-05-20 (×4): qty 1

## 2014-05-20 MED ORDER — CEFEPIME HCL 2 G IJ SOLR
500.0000 mg | INTRAMUSCULAR | Status: DC
  Administered 2014-05-20 – 2014-05-22 (×3): 500 mg via INTRAVENOUS
  Filled 2014-05-20 (×4): qty 0.5

## 2014-05-20 MED ORDER — HYDROCODONE-ACETAMINOPHEN 10-325 MG PO TABS
1.0000 | ORAL_TABLET | Freq: Four times a day (QID) | ORAL | Status: DC | PRN
  Administered 2014-05-20 – 2014-05-23 (×7): 1 via ORAL
  Filled 2014-05-20 (×7): qty 1

## 2014-05-20 MED ORDER — DOCUSATE SODIUM 100 MG PO CAPS
100.0000 mg | ORAL_CAPSULE | Freq: Every day | ORAL | Status: DC
  Administered 2014-05-21 – 2014-05-23 (×3): 100 mg via ORAL
  Filled 2014-05-20 (×3): qty 1

## 2014-05-20 MED ORDER — FUROSEMIDE 40 MG PO TABS
40.0000 mg | ORAL_TABLET | Freq: Every day | ORAL | Status: DC
  Administered 2014-05-20 – 2014-05-23 (×4): 40 mg via ORAL
  Filled 2014-05-20: qty 1
  Filled 2014-05-20: qty 2
  Filled 2014-05-20 (×2): qty 1

## 2014-05-20 MED ORDER — PANTOPRAZOLE SODIUM 40 MG PO TBEC
40.0000 mg | DELAYED_RELEASE_TABLET | Freq: Two times a day (BID) | ORAL | Status: DC
  Administered 2014-05-20 – 2014-05-23 (×6): 40 mg via ORAL
  Filled 2014-05-20 (×6): qty 1

## 2014-05-20 MED ORDER — ALBUTEROL SULFATE (2.5 MG/3ML) 0.083% IN NEBU: 2.5000 mg | INHALATION_SOLUTION | RESPIRATORY_TRACT | Status: AC | PRN

## 2014-05-20 MED ORDER — ASPIRIN EC 81 MG PO TBEC
81.0000 mg | DELAYED_RELEASE_TABLET | Freq: Every day | ORAL | Status: DC
  Administered 2014-05-20 – 2014-05-23 (×4): 81 mg via ORAL
  Filled 2014-05-20 (×4): qty 1

## 2014-05-20 MED ORDER — AMIODARONE HCL 200 MG PO TABS
200.0000 mg | ORAL_TABLET | Freq: Every day | ORAL | Status: DC
  Administered 2014-05-21 – 2014-05-23 (×3): 200 mg via ORAL
  Filled 2014-05-20 (×3): qty 1

## 2014-05-20 MED ORDER — VANCOMYCIN HCL IN DEXTROSE 750-5 MG/150ML-% IV SOLN
750.0000 mg | Freq: Once | INTRAVENOUS | Status: AC
  Administered 2014-05-20: 750 mg via INTRAVENOUS
  Filled 2014-05-20 (×2): qty 150

## 2014-05-20 MED ORDER — GUAIFENESIN ER 600 MG PO TB12
600.0000 mg | ORAL_TABLET | Freq: Two times a day (BID) | ORAL | Status: DC
  Administered 2014-05-20 – 2014-05-23 (×6): 600 mg via ORAL
  Filled 2014-05-20 (×7): qty 1

## 2014-05-20 MED ORDER — LORAZEPAM 0.5 MG PO TABS
0.5000 mg | ORAL_TABLET | Freq: Every day | ORAL | Status: DC | PRN
  Administered 2014-05-22 (×2): 0.5 mg via ORAL
  Filled 2014-05-20 (×2): qty 1

## 2014-05-20 MED ORDER — TAMSULOSIN HCL 0.4 MG PO CAPS
0.4000 mg | ORAL_CAPSULE | Freq: Every day | ORAL | Status: DC
  Administered 2014-05-20 – 2014-05-23 (×4): 0.4 mg via ORAL
  Filled 2014-05-20 (×4): qty 1

## 2014-05-20 MED ORDER — ISOSORBIDE MONONITRATE 15 MG HALF TABLET
15.0000 mg | ORAL_TABLET | Freq: Every day | ORAL | Status: DC
  Administered 2014-05-20 – 2014-05-23 (×4): 15 mg via ORAL
  Filled 2014-05-20 (×4): qty 1

## 2014-05-20 MED ORDER — BOOST PO LIQD
237.0000 mL | Freq: Three times a day (TID) | ORAL | Status: DC
  Administered 2014-05-21 – 2014-05-22 (×5): 237 mL via ORAL
  Filled 2014-05-20 (×11): qty 237

## 2014-05-20 MED ORDER — VANCOMYCIN HCL 500 MG IV SOLR
500.0000 mg | INTRAVENOUS | Status: DC
  Administered 2014-05-22: 500 mg via INTRAVENOUS
  Filled 2014-05-20: qty 500

## 2014-05-20 NOTE — ED Notes (Signed)
Admitting MD at bedside.

## 2014-05-20 NOTE — ED Provider Notes (Signed)
CSN: [id number]     Arrival date & time 05/20/14  1618 History   First MD Initiated Contact with Patient 05/20/14 1622     Chief Complaint  Patient presents with  . Foot Pain  . Shortness of Breath     (Consider location/radiation/quality/duration/timing/severity/associated sxs/prior Treatment) The history is provided by the patient and a relative.    Patient with multiple medical problems including DM, CAD, CKD, COPD, Afib, recent treatment for pneumonia (admission 9/5-9/8), d/c home on Augmentin, has since finished a second antibiotic from PCP Dr Lysbeth Galas p/w 2-3 days of worsening SOB, orthopnea, generalized weakness and right foot and lower leg swelling x 2 days.  Per family, patient's face and upper extremities are also swollen.  Denies fevers, recent falls, chest pain.   Pt ambulates with walker but per family stays in bed 90% of the time.   Had home health coming to the house until two days ago.    Past Medical History  Diagnosis Date  . Diabetes mellitus without complication   . DJD (degenerative joint disease)   . Cardiomegaly   . Coronary artery disease     a. LAD stent 1999 after NSTEMI. b. CABG 2004. c. Last LHC 02/2010 - patent grafts.  . Hypertension   . Hyperlipidemia   . Carotid artery disease 05/2003    a. s/p right carotid endarterectomy 05/2003  . Degenerative joint disease   . Restless leg syndrome   . CKD (chronic kidney disease), stage III   . Heparin induced thrombocytopenia   . Diphtheria     "as a child"  . Pleural effusion, bilateral 06/2003  . Pseudoaneurysm 01/1998    a. s/p repair following catherization in 01/1998  . Chronic systolic heart failure 10/02/11  . Ischemic cardiomyopathy     Echo 10/01/11: Mild LVH, EF 35-40%, basal inferior, basal to mid posterior and basal to mid anterolateral severe hypokinesis, mild to moderate AI, moderate MR (ischemic MR), moderate LAE, mild RVE, mildly reduced RV systolic function, mild RAE, PASP 43-44  . COPD (chronic  obstructive pulmonary disease)   . Atrial fibrillation/flutter     s/p multiple DCCVs;  amiodarone started 09/2011, TEE DCCV 3/13;  amio and coumadin d/c'd after admxn to [facility] 02/2012 with frequent falls/syncope  . Tachycardia-bradycardia syndrome     a. s/p Medtronic PPM 09/2011: SN: [id number] (8 sec pause noted)  . GERD (gastroesophageal reflux disease)   . Renal artery stenosis     bilateral- status post stenting  . Non-functioning kidney   . Depression     "cries alot"  . Gout   . Hypothyroidism   . Anemia    Past Surgical History  Procedure Laterality Date  . Carotid endarterectomy  05/2003    right  . Pacemaker insertion  10/02/11    implanted by Dr Johney Frame  . Cholecystectomy  ?02/2011  . Tonsillectomy and adenoidectomy    . Renal artery stent  06/2003    bilaterally/e-chart  . Thoracentesis  ? 2000; 05/2011  . Coronary angioplasty with stent placement  1999  . Av fistula repair  01/1998    w/pseudoaneurysm repair S/P catheterization/E-chart  . Coronary artery bypass graft  05/2003    CABG X 3  . Tee without cardioversion  10/23/2011    Procedure: TRANSESOPHAGEAL ECHOCARDIOGRAM (TEE);  Surgeon: Wendall Stade, MD;  Location: [facility] ENDOSCOPY;  Service: Cardiovascular;  Laterality: N/A;  . Cardioversion  10/23/2011    Procedure: CARDIOVERSION;  Surgeon: Wendall Stade, MD;  Location:  [facility] ENDOSCOPY;  Service: Cardiovascular;  Laterality: N/A;   Family History  Problem Relation Age of Onset  . Cancer Mother 6    died  . Lung disease Father 35    died  . Heart disease Sister     2 sisters and her son  . Anesthesia problems Neg Hx    History  Substance Use Topics  . Smoking status: Never Smoker   . Smokeless tobacco: Never Used  . Alcohol Use: No   OB History   Grav Para Term Preterm Abortions TAB SAB Ect Mult Living                 Review of Systems  All other systems reviewed and are negative.     Allergies  Heparin; Hydromorphone hcl; Warfarin and related;  Altace; Contrast media; Morphine and related; and Plavix  Home Medications   Prior to Admission medications   Medication Sig Start Date End Date Taking? Authorizing Provider  amiodarone (PACERONE) 200 MG tablet Take 200 mg by mouth daily.    Yes Historical Provider, MD  aspirin EC 81 MG tablet Take 81 mg by mouth daily.   Yes Historical Provider, MD  carvedilol (COREG) 12.5 MG tablet Take 1 tablet (12.5 mg total) by mouth 2 (two) times daily with a meal. 03/10/14  Yes Rollene Rotunda, MD  cyanocobalamin (,VITAMIN B-12,) 1000 MCG/ML injection Inject 1,000 mcg into the muscle every 30 (thirty) days. Done at Dr. Joyce Copa office   Yes Historical Provider, MD  docusate sodium (COLACE) 100 MG capsule Take 100 mg by mouth daily.    Yes Historical Provider, MD  furosemide (LASIX) 40 MG tablet Take 40 mg by mouth daily.  02/06/14  Yes Esperanza Sheets, MD  HYDROcodone-acetaminophen (NORCO) 10-325 MG per tablet Take 1 tablet by mouth every 6 (six) hours. scheduled 02/08/14  Yes Esperanza Sheets, MD  isosorbide mononitrate (IMDUR) 15 mg TB24 24 hr tablet Take 0.5 tablets (15 mg total) by mouth daily. 02/06/14  Yes Esperanza Sheets, MD  lactose free nutrition (BOOST) LIQD Take 237 mLs by mouth 3 (three) times daily between meals.   Yes Historical Provider, MD  levothyroxine (SYNTHROID, LEVOTHROID) 88 MCG tablet Take 88 mcg by mouth daily before breakfast.   Yes Historical Provider, MD  LORazepam (ATIVAN) 0.5 MG tablet Take 0.5 mg by mouth daily.   Yes Historical Provider, MD  omeprazole (PRILOSEC) 40 MG capsule Take 1 capsule (40 mg total) by mouth daily. 11/27/12  Yes Rhonda G Barrett, PA-C  potassium chloride (K-DUR,KLOR-CON) 10 MEQ tablet Take 10 mEq by mouth daily.   Yes Historical Provider, MD  Tamsulosin HCl (FLOMAX) 0.4 MG CAPS Take 1 capsule (0.4 mg total) by mouth daily. 08/29/12  Yes Dayna N Dunn, PA-C  nitroGLYCERIN (NITROSTAT) 0.4 MG SL tablet Place 0.4 mg under the tongue every 5 (five) minutes as  needed for chest pain.    Historical Provider, MD   BP 158/74  Pulse 73  Temp(Src) 97.7 F (36.5 C) (Oral)  Resp 20  SpO2 100% Physical Exam  Nursing note and vitals reviewed. Constitutional: She appears well-developed and well-nourished. No distress.  HENT:  Head: Normocephalic and atraumatic.  Neck: Neck supple.  Cardiovascular: Normal rate and regular rhythm.   Murmur heard. Pulmonary/Chest: No accessory muscle usage. Not tachypneic. No respiratory distress. She has decreased breath sounds. She has no wheezes. She has no rales.  Mildly decreased breath sounds at bilateral bases  Abdominal: Soft. She exhibits no distension.  There is tenderness. There is no rebound and no guarding.  Mild left sided tenderness  Musculoskeletal: Normal range of motion.  RLE with diffuse pitting edema to the proximal shin.  No erythema, warmth.  TTP over foot.  Distal pulses intact.   Neurological: She is alert.  Skin: She is not diaphoretic.  Psychiatric: She has a normal mood and affect. Her behavior is normal.    ED Course  Procedures (including critical care time) Labs Review Labs Reviewed  CBC WITH DIFFERENTIAL - Abnormal; Notable for the following:    Hemoglobin 10.8 (*)    HCT 33.4 (*)    RDW 18.6 (*)    All other components within normal limits  COMPREHENSIVE METABOLIC PANEL - Abnormal; Notable for the following:    Sodium 136 (*)    Glucose, Bld 112 (*)    BUN 26 (*)    Creatinine, Ser 1.53 (*)    Albumin 3.2 (*)    GFR calc non Af Amer 30 (*)    GFR calc Af Amer 34 (*)    All other components within normal limits  PRO B NATRIURETIC PEPTIDE - Abnormal; Notable for the following:    Pro B Natriuretic peptide (BNP) 09604.526328.0 (*)    All other components within normal limits  CULTURE, BLOOD (ROUTINE X 2)  CULTURE, BLOOD (ROUTINE X 2)  CULTURE, EXPECTORATED SPUTUM-ASSESSMENT  GRAM STAIN  TROPONIN I  LEGIONELLA ANTIGEN, URINE  STREP PNEUMONIAE URINARY ANTIGEN  COMPREHENSIVE  METABOLIC PANEL  CBC WITH DIFFERENTIAL  TSH    Imaging Review Dg Chest 2 View  05/20/2014   CLINICAL DATA:  Pain. Weakness. Shortness of breath. Prior infiltrate. Follow-up study.  EXAM: CHEST  2 VIEW  COMPARISON:  04/18/2014.  06/27/2013.  FINDINGS: Mediastinum and hilar structures are normal. Subsegmental atelectasis and/or scarring right mid lung field again noted. New right lower lobe infiltrate with small pleural effusion noted. Previously identified right upper lobe infiltrate is clear. Small left pleural effusion. Cardiomegaly. Prior CABG. Cardiac pacer and lead tips in right atrium right ventricle. No pneumothorax. No acute bony abnormality. Old posttraumatic changes left proximal humerus.  IMPRESSION: 1. New mild infiltrate right lower lobe with small right pleural effusion. 2. Interim clearing of right upper lobe infiltrate. 3. Stable pleural parenchymal scarring right mid lung field. 4. Stable cardiomegaly. Prior CABG. Cardiac pacer in stable position .   Electronically Signed   By: Maisie Fushomas  Register   On: 05/20/2014 18:02     EKG Interpretation None      Filed Vitals:   05/20/14 2207  BP: 140/65  Pulse: 73  Temp: 97.7 F (36.5 C)  Resp: 20      Date: 05/21/2014  Rate: 70  Rhythm: A-V paced   QRS Axis: left  Intervals: paced  ST/T Wave abnormalities: paced  Conduction Disutrbances:paced  Narrative Interpretation:   Old EKG Reviewed: unchanged    8:32 PM Discussed pt with Dr Allena KatzPatel who will see and admit the patient.   MDM   Final diagnoses:  HCAP (healthcare-associated pneumonia)  Acute on chronic congestive heart failure, unspecified congestive heart failure type    Afebrile nontoxic pt with recent hospitalization for pneumonia and acute on chronic CHF, discharged exactly 1 month ago, with second round of antibiotics from PCP after hospitalization, with worsening SOB and orthopnea x 2-3 days and new right leg swelling.   Doppler negative for DVT.  CXR shows new  pneumonia, will treat as HCAP.  BNP approximately same elevation as prior admission  for acute on chronic CHF - family notes she has swelling in face and upper arms, which is new.  Admitted to [facility], Dr Allena Katz to admit.     [place], PA-C 05/21/14 [phone]

## 2014-05-20 NOTE — ED Notes (Signed)
Pt from home via [facility] with c/o right foot pain related to gout x 2 days.  Pt in NAD&O.

## 2014-05-20 NOTE — ED Notes (Signed)
Patient transported to X-ray 

## 2014-05-20 NOTE — ED Notes (Signed)
Pt in vascular study

## 2014-05-20 NOTE — ED Notes (Signed)
Maxipime not compatible with vancomycin per micromedex, will start after completion of vancomycin. Unable to adm aspirin as not in pyxis.

## 2014-05-20 NOTE — ED Notes (Signed)
Noted pt's SpO2 to be 83% on RA.  With deep breathing instruction SpO2 corrects its self to 96-97% RA.

## 2014-05-20 NOTE — Consult Note (Signed)
ANTIBIOTIC CONSULT NOTE - INITIAL  Pharmacy Consult for Vancomycin and Cefepime Indication: pneumonia  Allergies  Allergen Reactions  . Heparin Other (See Comments)    Clots blood instead of thinning  . Hydromorphone Hcl Other (See Comments)    Crazy feeling  . Warfarin And Related Other (See Comments)    Syncope events  . Altace [Ramipril] Hives  . Contrast Media [Iodinated Diagnostic Agents] Other (See Comments)    Reaction noted by md- might have caused aneurism in 1999  . Morphine And Related Nausea And Vomiting  . Plavix [Clopidogrel Bisulfate] Hives and Itching    Patient Measurements: Height: 5' 4.17" (163 cm) Weight: 103 lb 6.3 oz (46.9 kg) IBW/kg (Calculated) : 55.1  Vital Signs: Temp: 97.7 F (36.5 C) (10/08 1629) Temp Source: Oral (10/08 1629) BP: 153/66 mmHg (10/08 1900) Pulse Rate: 69 (10/08 1900) Intake/Output from previous day:   Intake/Output from this shift:    Labs:  Recent Labs  05/20/14 1535  WBC 4.7  HGB 10.8*  PLT 165  CREATININE 1.53*   Estimated Creatinine Clearance: 19.5 ml/min (by C-G formula based on Cr of 1.53).  Microbiology: No results found for this or any previous visit (from the past 720 hour(s)).  Medical History: Past Medical History  Diagnosis Date  . Diabetes mellitus without complication   . DJD (degenerative joint disease)   . Cardiomegaly   . Coronary artery disease     a. LAD stent 1999 after NSTEMI. b. CABG 2004. c. Last LHC 02/2010 - patent grafts.  . Hypertension   . Hyperlipidemia   . Carotid artery disease 05/2003    a. s/p right carotid endarterectomy 05/2003  . Degenerative joint disease   . Restless leg syndrome   . CKD (chronic kidney disease), stage III   . Heparin induced thrombocytopenia   . Diphtheria     "as a child"  . Pleural effusion, bilateral 06/2003  . Pseudoaneurysm 01/1998    a. s/p repair following catherization in 01/1998  . Chronic systolic heart failure 10/02/11  . Ischemic  cardiomyopathy     Echo 10/01/11: Mild LVH, EF 35-40%, basal inferior, basal to mid posterior and basal to mid anterolateral severe hypokinesis, mild to moderate AI, moderate MR (ischemic MR), moderate LAE, mild RVE, mildly reduced RV systolic function, mild RAE, PASP 43-44  . COPD (chronic obstructive pulmonary disease)   . Atrial fibrillation/flutter     s/p multiple DCCVs;  amiodarone started 09/2011, TEE DCCV 3/13;  amio and coumadin d/c'd after admxn to [facility] 02/2012 with frequent falls/syncope  . Tachycardia-bradycardia syndrome     a. s/p Medtronic PPM 09/2011: SN: [id number] H (8 sec pause noted)  . GERD (gastroesophageal reflux disease)   . Renal artery stenosis     bilateral- status post stenting  . Non-functioning kidney   . Depression     "cries alot"  . Gout   . Hypothyroidism   . Anemia    Assessment: 88yof with recent admission for pneumonia (9/5-9/8) treated with vancomycin and cefepime then discharged on augmentin. Returns to the ED today with worsening shortness of breath. CXR shows new mild infiltrate in the RLL. She will begin vancomycin and cefepime again for probable HCAP. SCr is elevated at 1.5 but appears to be around her baseline.  Goal of Therapy:  Vancomycin trough level 15-20 mcg/ml  Plan:  1) Vancomycin 750mg  x 1 then 500mg  q48 2) Cefepime 500mg  q24 3) Follow renal function, cultures, LOT, level if needed  Fredrik Rigger  05/20/2014,8:06 PM

## 2014-05-20 NOTE — Progress Notes (Signed)
VASCULAR LAB PRELIMINARY  PRELIMINARY  PRELIMINARY  PRELIMINARY  Right lower extremity venous duplex completed.    Preliminary report:  Right:  No evidence of DVT, superficial thrombosis, or Baker's cyst.  Brynden Thune, RVS 05/20/2014, 7:37 PM

## 2014-05-20 NOTE — ED Notes (Signed)
Pt family reports pt does not have a hx of gout.  Pt family reports swelling and pain of right foot started yesterday.  Pt has had worsening shortness of breath following pneumonia and increased generalized edema, facial swelling, and cough at night when trying to lay flat.

## 2014-05-20 NOTE — H&P (Signed)
[facility] History and Physical  Patient: [name] [name]  ZOX:[id number]  DOB: 03-30-1925  DOS: the patient was seen and examined on 05/20/2014 PCP: Josue Hector, MD  Chief Complaint: shortness of breath and cough  HPI: [name] [name] is a 80 y.o. female with Past medical history of CAD s/p PCI and CABG, chronic systolic CHF,dual chamber PPM, esophageal dysphagia, CKD stage III-IV,hypothyroidism, renal artery stenosis with bilateral stents  . The pt present with complain of shortness of breath ongoing since last 2 week. She has been having cough and fever as well.  She was recently admitted for CAP and evaluated by speech therapy. She was discharge home on augmentin. She mentions that 2 weeks ago she started with fever, cough and sob, and was treated by her PCP if 7 day course of antibiotic which she completed just before yesterday, despite which her shortness of breath and cough has not improved and she also had a fever and therefore she decided to come to the hospital. She also has been having progressively worsening generalized weakness and right foot swelling with pain. The swelling of the right foot she has noted since last 2 days but denies any leg tenderness on the calf. No redness or injury reported on the foot. Other than being on antibiotic no recent change in the medication has been reported.  The patient is coming from home And at her baseline independent for most of her ADL.  Review of Systems: as mentioned in the history of present illness.  A Comprehensive review of the other systems is negative.  Past Medical History  Diagnosis Date  . Diabetes mellitus without complication   . DJD (degenerative joint disease)   . Cardiomegaly   . Coronary artery disease     a. LAD stent 1999 after NSTEMI. b. CABG 2004. c. Last LHC 02/2010 - patent grafts.  . Hypertension   . Hyperlipidemia   . Carotid artery disease 05/2003    a. s/p right carotid endarterectomy  05/2003  . Degenerative joint disease   . Restless leg syndrome   . CKD (chronic kidney disease), stage III   . Heparin induced thrombocytopenia   . Diphtheria     "as a child"  . Pleural effusion, bilateral 06/2003  . Pseudoaneurysm 01/1998    a. s/p repair following catherization in 01/1998  . Chronic systolic heart failure 10/02/11  . Ischemic cardiomyopathy     Echo 10/01/11: Mild LVH, EF 35-40%, basal inferior, basal to mid posterior and basal to mid anterolateral severe hypokinesis, mild to moderate AI, moderate MR (ischemic MR), moderate LAE, mild RVE, mildly reduced RV systolic function, mild RAE, PASP 43-44  . COPD (chronic obstructive pulmonary disease)   . Atrial fibrillation/flutter     s/p multiple DCCVs;  amiodarone started 09/2011, TEE DCCV 3/13;  amio and coumadin d/c'd after admxn to [facility] 02/2012 with frequent falls/syncope  . Tachycardia-bradycardia syndrome     a. s/p Medtronic PPM 09/2011: SN: [id number] H (8 sec pause noted)  . GERD (gastroesophageal reflux disease)   . Renal artery stenosis     bilateral- status post stenting  . Non-functioning kidney   . Depression     "cries alot"  . Gout   . Hypothyroidism   . Anemia    Past Surgical History  Procedure Laterality Date  . Carotid endarterectomy  05/2003    right  . Pacemaker insertion  10/02/11    implanted by Dr Johney Frame  . Cholecystectomy  ?02/2011  . Tonsillectomy  and adenoidectomy    . Renal artery stent  06/2003    bilaterally/e-chart  . Thoracentesis  ? 2000; 05/2011  . Coronary angioplasty with stent placement  1999  . Av fistula repair  01/1998    w/pseudoaneurysm repair S/P catheterization/E-chart  . Coronary artery bypass graft  05/2003    CABG X 3  . Tee without cardioversion  10/23/2011    Procedure: TRANSESOPHAGEAL ECHOCARDIOGRAM (TEE);  Surgeon: Wendall Stade, MD;  Location: [facility] ENDOSCOPY;  Service: Cardiovascular;  Laterality: N/A;  . Cardioversion  10/23/2011    Procedure: CARDIOVERSION;  Surgeon:  Wendall Stade, MD;  Location: [facility] ENDOSCOPY;  Service: Cardiovascular;  Laterality: N/A;   Social History:  reports that she has never smoked. She has never used smokeless tobacco. She reports that she does not drink alcohol or use illicit drugs.  Allergies  Allergen Reactions  . Heparin Other (See Comments)    Clots blood instead of thinning  . Hydromorphone Hcl Other (See Comments)    Crazy feeling  . Warfarin And Related Other (See Comments)    Syncope events  . Altace [Ramipril] Hives  . Contrast Media [Iodinated Diagnostic Agents] Other (See Comments)    Reaction noted by md- might have caused aneurism in 1999  . Morphine And Related Nausea And Vomiting  . Plavix [Clopidogrel Bisulfate] Hives and Itching    Family History  Problem Relation Age of Onset  . Cancer Mother 2    died  . Lung disease Father 49    died  . Heart disease Sister     2 sisters and her son  . Anesthesia problems Neg Hx     Prior to Admission medications   Medication Sig Start Date End Date Taking? Authorizing Provider  amiodarone (PACERONE) 200 MG tablet Take 200 mg by mouth daily.    Yes Historical Provider, MD  aspirin EC 81 MG tablet Take 81 mg by mouth daily.   Yes Historical Provider, MD  carvedilol (COREG) 12.5 MG tablet Take 1 tablet (12.5 mg total) by mouth 2 (two) times daily with a meal. 03/10/14  Yes Rollene Rotunda, MD  cyanocobalamin (,VITAMIN B-12,) 1000 MCG/ML injection Inject 1,000 mcg into the muscle every 30 (thirty) days. Done at Dr. Joyce Copa office   Yes Historical Provider, MD  docusate sodium (COLACE) 100 MG capsule Take 100 mg by mouth daily.    Yes Historical Provider, MD  furosemide (LASIX) 40 MG tablet Take 40 mg by mouth daily.  02/06/14  Yes Esperanza Sheets, MD  HYDROcodone-acetaminophen (NORCO) 10-325 MG per tablet Take 1 tablet by mouth every 6 (six) hours. scheduled 02/08/14  Yes Esperanza Sheets, MD  isosorbide mononitrate (IMDUR) 15 mg TB24 24 hr tablet Take 0.5 tablets  (15 mg total) by mouth daily. 02/06/14  Yes Esperanza Sheets, MD  lactose free nutrition (BOOST) LIQD Take 237 mLs by mouth 3 (three) times daily between meals.   Yes Historical Provider, MD  levothyroxine (SYNTHROID, LEVOTHROID) 88 MCG tablet Take 88 mcg by mouth daily before breakfast.   Yes Historical Provider, MD  LORazepam (ATIVAN) 0.5 MG tablet Take 0.5 mg by mouth daily.   Yes Historical Provider, MD  omeprazole (PRILOSEC) 40 MG capsule Take 1 capsule (40 mg total) by mouth daily. 11/27/12  Yes Rhonda G Barrett, PA-C  potassium chloride (K-DUR,KLOR-CON) 10 MEQ tablet Take 10 mEq by mouth daily.   Yes Historical Provider, MD  Tamsulosin HCl (FLOMAX) 0.4 MG CAPS Take 1 capsule (0.4 mg  total) by mouth daily. 08/29/12  Yes Dayna N Dunn, PA-C  nitroGLYCERIN (NITROSTAT) 0.4 MG SL tablet Place 0.4 mg under the tongue every 5 (five) minutes as needed for chest pain.    Historical Provider, MD    Physical Exam: Filed Vitals:   05/20/14 1900 05/20/14 2010 05/20/14 2023 05/20/14 2030  BP: 153/66 143/95 133/81 153/67  Pulse: 69 73 70 72  Temp:   97.4 F (36.3 C)   TempSrc:   Oral   Resp: 20 16 17 23   Height: 5' 4.17" (1.63 m)     Weight: 46.9 kg (103 lb 6.3 oz)     SpO2: 100% 98% 100% 100%    General: Alert, Awake and Oriented to Time, Place and Person. Appear in mild distress Eyes: PERRL ENT: Oral Mucosa clear moist Neck: elevated JVD Cardiovascular: S1 and S2 Present, aortic systolic Murmur, Peripheral Pulses Present Respiratory: Bilateral Air entry equal and Decreased, bilateral basal Crackles, no  wheezes Abdomen: Bowel Sound present, Soft and non tender Skin: no Rash Extremities: right more than left Pedal edema, right calf tenderness Neurologic: Grossly no focal neuro deficit.  Labs on Admission:  CBC:  Recent Labs Lab 05/20/14 1535  WBC 4.7  NEUTROABS 3.1  HGB 10.8*  HCT 33.4*  MCV 84.1  PLT 165    CMP     Component Value Date/Time   NA 136* 05/20/2014 1535   K 4.4  05/20/2014 1535   CL 100 05/20/2014 1535   CO2 22 05/20/2014 1535   GLUCOSE 112* 05/20/2014 1535   BUN 26* 05/20/2014 1535   CREATININE 1.53* 05/20/2014 1535   CREATININE 1.98* 07/16/2012 1055   CALCIUM 8.8 05/20/2014 1535   PROT 6.5 05/20/2014 1535   ALBUMIN 3.2* 05/20/2014 1535   AST 15 05/20/2014 1535   ALT 6 05/20/2014 1535   ALKPHOS 77 05/20/2014 1535   BILITOT 0.8 05/20/2014 1535   GFRNONAA 30* 05/20/2014 1535   GFRAA 34* 05/20/2014 1535    No results found for this basename: LIPASE, AMYLASE,  in the last 168 hours No results found for this basename: AMMONIA,  in the last 168 hours   Recent Labs Lab 05/20/14 1535  TROPONINI <0.30   BNP (last 3 results)  Recent Labs  01/26/14 1030 04/17/14 0447 05/20/14 1535  PROBNP 9027.0* 30005.0* 26328.0*    Radiological Exams on Admission: Dg Chest 2 View  05/20/2014   CLINICAL DATA:  Pain. Weakness. Shortness of breath. Prior infiltrate. Follow-up study.  EXAM: CHEST  2 VIEW  COMPARISON:  04/18/2014.  06/27/2013.  FINDINGS: Mediastinum and hilar structures are normal. Subsegmental atelectasis and/or scarring right mid lung field again noted. New right lower lobe infiltrate with small pleural effusion noted. Previously identified right upper lobe infiltrate is clear. Small left pleural effusion. Cardiomegaly. Prior CABG. Cardiac pacer and lead tips in right atrium right ventricle. No pneumothorax. No acute bony abnormality. Old posttraumatic changes left proximal humerus.  IMPRESSION: 1. New mild infiltrate right lower lobe with small right pleural effusion. 2. Interim clearing of right upper lobe infiltrate. 3. Stable pleural parenchymal scarring right mid lung field. 4. Stable cardiomegaly. Prior CABG. Cardiac pacer in stable position .   Electronically Signed   By: Maisie Fushomas  Register   On: 05/20/2014 18:02    EKG: Independently reviewed. AV paced rhythm.  Assessment/Plan Principal Problem:   Recurrent pneumonia Active Problems:   Essential  hypertension   Coronary atherosclerosis   Chronic systolic CHF (congestive heart failure)   Cardiac pacemaker in  situ- MDT Feb 2013   Hypothyroidism   CKD (chronic kidney disease), stage III   PAF- no Coumadin secondary to falls.   HCAP (healthcare-associated pneumonia)   Esophageal dysphagia   1. HCAP (healthcare-associated pneumonia) The patient is presenting with complaints of progressively worsening shortness of breath cough and generalized weakness. Chest x-ray shows a new right-sided mild infiltrate with pleural effusion. With this and her prior history of esophageal dysphagia the patient is at high risk for aspiration, and probably the infiltrate is aspiration pneumonitis versus less likely pneumonia as she does not have any fever or leukocytosis here. Currently due to her recent hospitalization she will be treated as healthcare associated pneumonia with vancomycin and cefepime. I will obtain a speech therapy consultation in the morning and follow sputum culture urine antigens as well as blood cultures. Oxygenation as needed. Other etiology of her bilateral lung abnormality is possible amiodarone-induced toxicity. If her symptoms does not improve she may require noncontrast CT scan of the chest.  2. Chronic systolic CHF. Patient does not appear to be significantly volume overloaded at present, her weight does not appear to be changed as well. ProBNP is improved. At this I would continue her home Lasix.  3. Chronic kidney disease. Continue to monitor.  4. Hypothyroidism. Continue Synthroid.  Advance goals of care discussion: code status DNR DNI per Patient  DVT Prophylaxis: mechanical compression device Nutrition: advance as tolerated, speech therapy consult  Disposition: Admitted to inpatient in telemetry unit.  Author: Lynden Oxford, MD [facility] Pager: [phone] 05/20/2014, 8:57 PM    If 7PM-7AM, please contact night-coverage [email or web address] Password [email or web address]

## 2014-05-20 NOTE — ED Notes (Signed)
Placed pt on 2L nasal cannula 

## 2014-05-21 LAB — CBC WITH DIFFERENTIAL/PLATELET
Basophils Absolute: 0 10*3/uL (ref 0.0–0.1)
Basophils Relative: 0 % (ref 0–1)
Eosinophils Absolute: 0.1 10*3/uL (ref 0.0–0.7)
Eosinophils Relative: 3 % (ref 0–5)
HEMATOCRIT: 32.3 % — AB (ref 36.0–46.0)
Hemoglobin: 10.3 g/dL — ABNORMAL LOW (ref 12.0–15.0)
LYMPHS PCT: 21 % (ref 12–46)
Lymphs Abs: 1 10*3/uL (ref 0.7–4.0)
MCH: 27.4 pg (ref 26.0–34.0)
MCHC: 31.9 g/dL (ref 30.0–36.0)
MCV: 85.9 fL (ref 78.0–100.0)
MONO ABS: 0.4 10*3/uL (ref 0.1–1.0)
Monocytes Relative: 9 % (ref 3–12)
NEUTROS ABS: 3.1 10*3/uL (ref 1.7–7.7)
Neutrophils Relative %: 67 % (ref 43–77)
PLATELETS: 142 10*3/uL — AB (ref 150–400)
RBC: 3.76 MIL/uL — AB (ref 3.87–5.11)
RDW: 18.7 % — AB (ref 11.5–15.5)
WBC: 4.6 10*3/uL (ref 4.0–10.5)

## 2014-05-21 LAB — COMPREHENSIVE METABOLIC PANEL
ALT: <5 U/L (ref 0–35)
ANION GAP: 14 (ref 5–15)
AST: 12 U/L (ref 0–37)
Albumin: 2.9 g/dL — ABNORMAL LOW (ref 3.5–5.2)
Alkaline Phosphatase: 71 U/L (ref 39–117)
BILIRUBIN TOTAL: 0.7 mg/dL (ref 0.3–1.2)
BUN: 26 mg/dL — AB (ref 6–23)
CHLORIDE: 101 meq/L (ref 96–112)
CO2: 23 mEq/L (ref 19–32)
CREATININE: 1.46 mg/dL — AB (ref 0.50–1.10)
Calcium: 8.6 mg/dL (ref 8.4–10.5)
GFR calc Af Amer: 36 mL/min — ABNORMAL LOW (ref >90)
GFR calc non Af Amer: 31 mL/min — ABNORMAL LOW (ref >90)
Glucose, Bld: 79 mg/dL (ref 70–99)
Potassium: 4 mEq/L (ref 3.7–5.3)
Sodium: 138 mEq/L (ref 137–147)
Total Protein: 5.9 g/dL — ABNORMAL LOW (ref 6.0–8.3)

## 2014-05-21 LAB — STREP PNEUMONIAE URINARY ANTIGEN: Strep Pneumo Urinary Antigen: NEGATIVE

## 2014-05-21 LAB — TSH: TSH: 1.61 u[IU]/mL (ref 0.350–4.500)

## 2014-05-21 MED ORDER — BOOST / RESOURCE BREEZE PO LIQD
1.0000 | Freq: Every day | ORAL | Status: DC
  Administered 2014-05-21 – 2014-05-22 (×2): 1 via ORAL

## 2014-05-21 NOTE — Evaluation (Signed)
Clinical/Bedside Swallow Evaluation Patient Details  Name: [name] [name] MRN: [id number] Date of Birth: [birth year]  Today's Date: 05/21/2014 Time: 0820-0847 SLP Time Calculation (min): 27 min  Past Medical History:  Past Medical History  Diagnosis Date  . Diabetes mellitus without complication   . DJD (degenerative joint disease)   . Cardiomegaly   . Coronary artery disease     a. LAD stent 1999 after NSTEMI. b. CABG 2004. c. Last LHC 02/2010 - patent grafts.  . Hypertension   . Hyperlipidemia   . Carotid artery disease 05/2003    a. s/p right carotid endarterectomy 05/2003  . Degenerative joint disease   . Restless leg syndrome   . CKD (chronic kidney disease), stage III   . Heparin induced thrombocytopenia   . Diphtheria     "as a child"  . Pleural effusion, bilateral 06/2003  . Pseudoaneurysm 01/1998    a. s/p repair following catherization in 01/1998  . Chronic systolic heart failure 10/02/11  . Ischemic cardiomyopathy     Echo 10/01/11: Mild LVH, EF 35-40%, basal inferior, basal to mid posterior and basal to mid anterolateral severe hypokinesis, mild to moderate AI, moderate MR (ischemic MR), moderate LAE, mild RVE, mildly reduced RV systolic function, mild RAE, PASP 43-44  . COPD (chronic obstructive pulmonary disease)   . Atrial fibrillation/flutter     s/p multiple DCCVs;  amiodarone started 09/2011, TEE DCCV 3/13;  amio and coumadin d/c'd after [facility] with frequent falls/syncope  . Tachycardia-bradycardia syndrome     a. s/p Medtronic PPM 09/2011: SN: [id number] H (8 sec pause noted)  . GERD (gastroesophageal reflux disease)   . Renal artery stenosis     bilateral- status post stenting  . Non-functioning kidney   . Depression     "cries alot"  . Gout   . Hypothyroidism   . Anemia    Past Surgical History:  Past Surgical History  Procedure Laterality Date  . Carotid endarterectomy  05/2003    right  . Pacemaker insertion  10/02/11    implanted by Dr  Johney FrameAllred  . Cholecystectomy  ?02/2011  . Tonsillectomy and adenoidectomy    . Renal artery stent  06/2003    bilaterally/e-chart  . Thoracentesis  ? 2000; 05/2011  . Coronary angioplasty with stent placement  1999  . Av fistula repair  01/1998    w/pseudoaneurysm repair S/P catheterization/E-chart  . Coronary artery bypass graft  05/2003    CABG X 3  . Tee without cardioversion  10/23/2011    Procedure: TRANSESOPHAGEAL ECHOCARDIOGRAM (TEE);  Surgeon: Wendall StadePeter C Nishan, MD;  [facility];  Service: Cardiovascular;  Laterality: N/A;  . Cardioversion  10/23/2011    Procedure: CARDIOVERSION;  Surgeon: Wendall StadePeter C Nishan, MD;  [facility];  Service: Cardiovascular;  Laterality: N/A;   HPI:  [name] [name] is a 80 y.o. female with Past medical history of CAD s/p PCI and CABG, chronic systolic CHF,dual chamber PPM, esophageal dysphagia, CKD stage III-IV,hypothyroidism, renal artery stenosis with bilateral stents. admitted with shortness of breath and cough, believed to have aspiration pneumonitis. Pt seen by SLP on 9/7, determiend to be at risk for post prandial aspiration due to history of GERD and reported symptom of globus, but otherwise WNL at bedside. Medical management deferred to MD.    Assessment / Plan / Recommendation Clinical Impression  Pt does not demonstrate any evidence of impaired swallow function with subjective observation. There is also no complaint of regurgitation, or signs consistent  with overt esophageal dysphagia. Despite this, pts most probable impairment, if any, would be night time aspiration of reflux as she continues to report late night eating and returning to a flat bed immediately after eating, despite education and ability to recall recommendation from prior visit. Recommend f/u with esophageal testing to evaluate function if aspiration pneumonitis is high on differential diagnosis. SLP will f/u x1 to reinforce education and recheck function prior to d/c given  concerns.     Aspiration Risk  Moderate    Diet Recommendation Regular;Thin liquid   Liquid Administration via: Cup;Straw Medication Administration: Whole meds with liquid Supervision: Patient able to self feed Postural Changes and/or Swallow Maneuvers: Seated upright 90 degrees;Upright 30-60 min after meal;Out of bed for meals    Other  Recommendations Recommended Consults: Consider esophageal assessment Oral Care Recommendations: Oral care BID   Follow Up Recommendations  None    Frequency and Duration        Pertinent Vitals/Pain NA    SLP Swallow Goals     Swallow Study Prior Functional Status       General HPI: [name] [name] is a 80 y.o. female with Past medical history of CAD s/p PCI and CABG, chronic systolic CHF,dual chamber PPM, esophageal dysphagia, CKD stage III-IV,hypothyroidism, renal artery stenosis with bilateral stents. admitted with shortness of breath and cough, believed to have aspiration pneumonitis. Pt seen by SLP on 9/7, determiend to be at risk for post prandial aspiration due to history of GERD and reported symptom of globus, but otherwise WNL at bedside. Medical management deferred to MD.  Type of Study: Bedside swallow evaluation Previous Swallow Assessment: BSE last visit, see history Diet Prior to this Study: Regular;Thin liquids Temperature Spikes Noted: No Respiratory Status: Nasal cannula History of Recent Intubation: No Behavior/Cognition: Alert;Cooperative;Pleasant mood Oral Cavity - Dentition: Edentulous;Dentures, not available Self-Feeding Abilities: Able to feed self Patient Positioning: Upright in bed Baseline Vocal Quality: Clear Volitional Cough: Strong Volitional Swallow: Able to elicit    Oral/Motor/Sensory Function Overall Oral Motor/Sensory Function: Appears within functional limits for tasks assessed   Ice Chips     Thin Liquid Thin Liquid: Within functional limits    Nectar Thick     Honey Thick     Puree Puree: Within  functional limits   Solid   GO    Solid: Within functional limits      Harlon Ditty, MA CCC-SLP [phone]  Makail Watling, Riley Nearing 05/21/2014,8:55 AM

## 2014-05-21 NOTE — Progress Notes (Signed)
Patient ID: [name] [name]  female  HBZ:[id number]    DOB: [birth year]    DOA: 05/20/2014  PCP: Josue Hector, MD  Brief history of present illness [name] [name] is a 80 y.o. female with Past medical history of CAD s/p PCI and CABG, chronic systolic CHF,dual chamber PPM, esophageal dysphagia, CKD stage III-IV,hypothyroidism, renal artery stenosis with bilateral stents . Patient presented with complaints of shortness of breath ongoing for the last 2 weeks, cough and fever. She was recently admitted for CAP last month and evaluated by speech therapy. She was discharge home on augmentin. 2 weeks ago patient was having fever, dyspnea and coughing and was treated with 7daycourse of antibiotic per PCP. Chest x-ray showed new right-sided infiltrate with pleural effusion, patient was admitted for further workup.  Assessment/Plan: Principal Problem:   Recurrent pneumonia/   HCAP (healthcare-associated pneumonia) - Continue vancomycin and cefepime, - Follow sputum culture, urine antigen and antigen, urine strep antigen negative, f/u blood cultures - Swallow evaluation  Active Problems: Right lower extremity swelling - Obtain Doppler ultrasound to rule out DVT    Essential hypertension - Stable  CAD, Chronic systolic CHF (congestive heart failure) - Currently euvolemic, continue home dose of Lasix    Hypothyroidism - Continue Synthroid    CKD (chronic kidney disease), stage III - Stable at baseline    PAF- no Coumadin secondary to falls. - Continue amiodarone    Esophageal dysphagia - Swallow evaluation  DVT Prophylaxis:  Code Status: DO NOT RESUSCITATE  Family Communication:  Disposition:  Consultants:  None  Procedures:  None  Antibiotics:  Vancomycin  Cefepime  Subjective: Patient seen and examined, denies any specific complaints, afebrile  Objective: Weight change:   Intake/Output Summary (Last 24 hours) at 05/21/14 1303 Last data filed at  05/21/14 0900  Gross per 24 hour  Intake    240 ml  Output    900 ml  Net   -660 ml   Blood pressure 162/69, pulse 70, temperature 97.5 F (36.4 C), temperature source Oral, resp. rate 19, height 5' 4.17" (1.63 m), weight 49.805 kg (109 lb 12.8 oz), SpO2 99.00%.  Physical Exam: General: Alert and awake, oriented x3, not in any acute distress. CVS: S1-S2 clear, no murmur rubs or gallops Chest: Decreased breath sound at the bases Abdomen: soft nontender, nondistended, normal bowel sounds  Extremities: no cyanosis, clubbing or edema noted bilaterally Neuro: Cranial nerves II-XII intact, no focal neurological deficits  Lab Results: Basic Metabolic Panel:  Recent Labs Lab 05/20/14 1535 05/21/14 0547  NA 136* 138  K 4.4 4.0  CL 100 101  CO2 22 23  GLUCOSE 112* 79  BUN 26* 26*  CREATININE 1.53* 1.46*  CALCIUM 8.8 8.6   Liver Function Tests:  Recent Labs Lab 05/20/14 1535 05/21/14 0547  AST 15 12  ALT 6 <5  ALKPHOS 77 71  BILITOT 0.8 0.7  PROT 6.5 5.9*  ALBUMIN 3.2* 2.9*   No results found for this basename: LIPASE, AMYLASE,  in the last 168 hours No results found for this basename: AMMONIA,  in the last 168 hours CBC:  Recent Labs Lab 05/20/14 1535 05/21/14 0547  WBC 4.7 4.6  NEUTROABS 3.1 3.1  HGB 10.8* 10.3*  HCT 33.4* 32.3*  MCV 84.1 85.9  PLT 165 142*   Cardiac Enzymes:  Recent Labs Lab 05/20/14 1535  TROPONINI <0.30   BNP: No components found with this basename: POCBNP,  CBG: No results found for this basename: GLUCAP,  in  the last 168 hours   Micro Results: No results found for this or any previous visit (from the past 240 hour(s)).  Studies/Results: Dg Chest 2 View  05/20/2014   CLINICAL DATA:  Pain. Weakness. Shortness of breath. Prior infiltrate. Follow-up study.  EXAM: CHEST  2 VIEW  COMPARISON:  04/18/2014.  06/27/2013.  FINDINGS: Mediastinum and hilar structures are normal. Subsegmental atelectasis and/or scarring right mid lung  field again noted. New right lower lobe infiltrate with small pleural effusion noted. Previously identified right upper lobe infiltrate is clear. Small left pleural effusion. Cardiomegaly. Prior CABG. Cardiac pacer and lead tips in right atrium right ventricle. No pneumothorax. No acute bony abnormality. Old posttraumatic changes left proximal humerus.  IMPRESSION: 1. New mild infiltrate right lower lobe with small right pleural effusion. 2. Interim clearing of right upper lobe infiltrate. 3. Stable pleural parenchymal scarring right mid lung field. 4. Stable cardiomegaly. Prior CABG. Cardiac pacer in stable position .   Electronically Signed   By: Maisie Fushomas  Register   On: 05/20/2014 18:02    Medications: Scheduled Meds: . amiodarone  200 mg Oral Daily  . aspirin EC  81 mg Oral Daily  . carvedilol  12.5 mg Oral BID WC  . ceFEPime (MAXIPIME) IV  500 mg Intravenous Q24H  . docusate sodium  100 mg Oral Daily  . furosemide  40 mg Oral Daily  . guaiFENesin  600 mg Oral BID  . isosorbide mononitrate  15 mg Oral Daily  . lactose free nutrition  237 mL Oral TID BM  . levothyroxine  88 mcg Oral QAC breakfast  . pantoprazole  40 mg Oral BID AC  . tamsulosin  0.4 mg Oral Daily  . [START ON 05/22/2014] vancomycin  500 mg Intravenous Q48H      LOS: 1 day   Elena Cothern M.D. [facility] 05/21/2014, 1:03 PM Pager: [phone]  If 7PM-7AM, please contact night-coverage [email or web address] Password [email or web address]

## 2014-05-21 NOTE — Progress Notes (Signed)
Utilization review completed.  

## 2014-05-21 NOTE — Progress Notes (Signed)
INITIAL NUTRITION ASSESSMENT  Pt meets criteria for NON-SEVERE (MODERATE) MALNUTRITION in the context of chronic illness as evidenced by moderate fat and muscle mass loss.  DOCUMENTATION CODES Per approved criteria  -Non-severe (moderate) malnutrition in the context of chronic illness   INTERVENTION: Continue Boost Plus po TID, each supplement contains 360 kcal and 14 grams of protein.  Provide Resource Breeze po once daily, each supplement provides 250 kcal and 9 grams of protein.  NUTRITION DIAGNOSIS: Increased nutrient needs related to chronic illness as evidenced by estimated nutrition needs.   Goal: Pt to meet >/= 90% of their estimated nutrition needs   Monitor:  PO intake, weight trends, labs, I/O's  Reason for Assessment: Low BMI  80 y.o. female  Admitting Dx: Recurrent pneumonia  ASSESSMENT: Pt with past medical history of CAD s/p PCI and CABG, chronic systolic CHF,dual chamber PPM, esophageal dysphagia, CKD stage III-IV,hypothyroidism, renal artery stenosis with bilateral stents . The pt present with complain of shortness of breath ongoing since last 2 week. She has been having cough and fever as well.   Pt reports having a good appetite currently and prior to admission at home with no difficulties or problems. Meal completion is 75%. Pt reports no recent weight loss with her usual body weight of 103 lbs. Pt reports she has been drinking her Boost and likes them. Pt also reports she would like also [employer] ordered. Will order. Pt was encouraged to eat her food at meals and to drink her supplements.  Nutrition Focused Physical Exam:  Subcutaneous Fat:  Orbital Region: N/A Upper Arm Region: Moderate depletion Thoracic and Lumbar Region: WNL  Muscle:  Temple Region: Moderate depletion Clavicle Bone Region: Moderate depletion Clavicle and Acromion Bone Region: Moderate depletion Scapular Bone Region: N/A Dorsal Hand: Moderate depletion Patellar Region:  WNL Anterior Thigh Region: WNL Posterior Calf Region: WNL  Edema: +3 RLE edema  Labs: Low GFR. High BUN and creatinine.  Height: Ht Readings from Last 1 Encounters:  05/20/14 5' 4.17" (1.63 m)    Weight: Wt Readings from Last 1 Encounters:  05/21/14 109 lb 12.8 oz (49.805 kg)    Ideal Body Weight: 120 lbs  % Ideal Body Weight: 91%  Wt Readings from Last 10 Encounters:  05/21/14 109 lb 12.8 oz (49.805 kg)  04/20/14 103 lb 6.3 oz (46.9 kg)  03/10/14 106 lb (48.081 kg)  02/07/14 103 lb (46.72 kg)  01/28/14 106 lb 8 oz (48.308 kg)  02/04/13 116 lb 6.4 oz (52.799 kg)  02/04/13 116 lb 6.4 oz (52.799 kg)  01/11/13 121 lb 11.1 oz (55.2 kg)  11/27/12 108 lb 4.8 oz (49.125 kg)  09/05/12 110 lb 4 oz (50.01 kg)    Usual Body Weight: 103 lbs  % Usual Body Weight: 106%  BMI:  Body mass index is 18.75 kg/(m^2).   Estimated Nutritional Needs: Kcal: 1400 - 1600  Protein: 50 - 65 g  Fluid: >1.5 L/day  Skin: +3 LE edema  Diet Order: Cardiac  EDUCATION NEEDS: -No education needs identified at this time   Intake/Output Summary (Last 24 hours) at 05/21/14 0922 Last data filed at 05/21/14 0900  Gross per 24 hour  Intake    240 ml  Output    900 ml  Net   -660 ml    Last BM: 10/7  Labs:   Recent Labs Lab 05/20/14 1535 05/21/14 0547  NA 136* 138  K 4.4 4.0  CL 100 101  CO2 22 23  BUN 26*  26*  CREATININE 1.53* 1.46*  CALCIUM 8.8 8.6  GLUCOSE 112* 79    CBG (last 3)  No results found for this basename: GLUCAP,  in the last 72 hours  Scheduled Meds: . amiodarone  200 mg Oral Daily  . aspirin EC  81 mg Oral Daily  . carvedilol  12.5 mg Oral BID WC  . ceFEPime (MAXIPIME) IV  500 mg Intravenous Q24H  . docusate sodium  100 mg Oral Daily  . furosemide  40 mg Oral Daily  . guaiFENesin  600 mg Oral BID  . isosorbide mononitrate  15 mg Oral Daily  . lactose free nutrition  237 mL Oral TID BM  . levothyroxine  88 mcg Oral QAC breakfast  . pantoprazole  40  mg Oral BID AC  . tamsulosin  0.4 mg Oral Daily  . [START ON 05/22/2014] vancomycin  500 mg Intravenous Q48H    Continuous Infusions:   Past Medical History  Diagnosis Date  . Diabetes mellitus without complication   . DJD (degenerative joint disease)   . Cardiomegaly   . Coronary artery disease     a. LAD stent 1999 after NSTEMI. b. CABG 2004. c. Last LHC 02/2010 - patent grafts.  . Hypertension   . Hyperlipidemia   . Carotid artery disease 05/2003    a. s/p right carotid endarterectomy 05/2003  . Degenerative joint disease   . Restless leg syndrome   . CKD (chronic kidney disease), stage III   . Heparin induced thrombocytopenia   . Diphtheria     "as a child"  . Pleural effusion, bilateral 06/2003  . Pseudoaneurysm 01/1998    a. s/p repair following catherization in 01/1998  . Chronic systolic heart failure 10/02/11  . Ischemic cardiomyopathy     Echo 10/01/11: Mild LVH, EF 35-40%, basal inferior, basal to mid posterior and basal to mid anterolateral severe hypokinesis, mild to moderate AI, moderate MR (ischemic MR), moderate LAE, mild RVE, mildly reduced RV systolic function, mild RAE, PASP 43-44  . COPD (chronic obstructive pulmonary disease)   . Atrial fibrillation/flutter     s/p multiple DCCVs;  amiodarone started 09/2011, TEE DCCV 3/13;  amio and coumadin d/c'd [facility] with frequent falls/syncope  . Tachycardia-bradycardia syndrome     a. s/p [employer] PPM 09/2011: SN: [id number] H (8 sec pause noted)  . GERD (gastroesophageal reflux disease)   . Renal artery stenosis     bilateral- status post stenting  . Non-functioning kidney   . Depression     "cries alot"  . Gout   . Hypothyroidism   . Anemia     Past Surgical History  Procedure Laterality Date  . Carotid endarterectomy  05/2003    right  . Pacemaker insertion  10/02/11    implanted by Dr Johney FrameAllred  . Cholecystectomy  ?02/2011  . Tonsillectomy and adenoidectomy    . Renal artery stent  06/2003     bilaterally/e-chart  . Thoracentesis  ? 2000; 05/2011  . Coronary angioplasty with stent placement  1999  . Av fistula repair  01/1998    w/pseudoaneurysm repair S/P catheterization/E-chart  . Coronary artery bypass graft  05/2003    CABG X 3  . Tee without cardioversion  10/23/2011    Procedure: TRANSESOPHAGEAL ECHOCARDIOGRAM (TEE);  Surgeon: Wendall StadePeter C Nishan, MD;  [facility];  Service: Cardiovascular;  Laterality: N/A;  . Cardioversion  10/23/2011    Procedure: CARDIOVERSION;  Surgeon: Wendall StadePeter C Nishan, MD;  [facility];  Service: Cardiovascular;  Laterality: N/A;    Marijean Niemann, MS, RD, Provisional LDN Pager # [phone] After hours/ weekend pager # [phone]

## 2014-05-21 NOTE — ED Provider Notes (Signed)
Medical screening examination/treatment/procedure(s) were conducted as a shared visit with non-physician practitioner(s) and myself.  I personally evaluated the patient during the encounter.   EKG Interpretation None      Patient is comfortable at this time but has CHF and PNA. Given comorbidities will need admit and IV abx.  Audree Camel, MD 05/21/14 [phone]

## 2014-05-22 DIAGNOSIS — N183 Chronic kidney disease, stage 3 (moderate): Secondary | ICD-10-CM

## 2014-05-22 DIAGNOSIS — I509 Heart failure, unspecified: Secondary | ICD-10-CM

## 2014-05-22 DIAGNOSIS — E440 Moderate protein-calorie malnutrition: Secondary | ICD-10-CM | POA: Diagnosis present

## 2014-05-22 DIAGNOSIS — D519 Vitamin B12 deficiency anemia, unspecified: Secondary | ICD-10-CM

## 2014-05-22 LAB — CBC
HCT: 31.9 % — ABNORMAL LOW (ref 36.0–46.0)
Hemoglobin: 10.2 g/dL — ABNORMAL LOW (ref 12.0–15.0)
MCH: 27.5 pg (ref 26.0–34.0)
MCHC: 32 g/dL (ref 30.0–36.0)
MCV: 86 fL (ref 78.0–100.0)
PLATELETS: 135 10*3/uL — AB (ref 150–400)
RBC: 3.71 MIL/uL — ABNORMAL LOW (ref 3.87–5.11)
RDW: 18.6 % — AB (ref 11.5–15.5)
WBC: 4.6 10*3/uL (ref 4.0–10.5)

## 2014-05-22 LAB — LEGIONELLA ANTIGEN, URINE

## 2014-05-22 LAB — BASIC METABOLIC PANEL
Anion gap: 15 (ref 5–15)
BUN: 27 mg/dL — AB (ref 6–23)
CALCIUM: 8.3 mg/dL — AB (ref 8.4–10.5)
CO2: 22 mEq/L (ref 19–32)
CREATININE: 1.34 mg/dL — AB (ref 0.50–1.10)
Chloride: 99 mEq/L (ref 96–112)
GFR calc Af Amer: 40 mL/min — ABNORMAL LOW (ref >90)
GFR, EST NON AFRICAN AMERICAN: 35 mL/min — AB (ref >90)
Glucose, Bld: 99 mg/dL (ref 70–99)
Potassium: 4 mEq/L (ref 3.7–5.3)
Sodium: 136 mEq/L — ABNORMAL LOW (ref 137–147)

## 2014-05-22 NOTE — Progress Notes (Signed)
Pharmacist Heart Failure Core Measure Documentation  Assessment: [name] [name] has an EF documented as 30-35% on 04/18/14.  Rationale: Heart failure patients with left ventricular systolic dysfunction (LVSD) and an EF < 40% should be prescribed an angiotensin converting enzyme inhibitor (ACEI) or angiotensin receptor blocker (ARB) at discharge unless a contraindication is documented in the medical record.  This patient is not currently on an ACEI or ARB for HF.  This note is being placed in the record in order to provide documentation that a contraindication to the use of these agents is present for this encounter.  ACE Inhibitor or Angiotensin Receptor Blocker is contraindicated (specify all that apply)  []   ACEI allergy AND ARB allergy  []   Angioedema []   Moderate or severe aortic stenosis []   Hyperkalemia []   Hypotension [x]   Renal artery stenosis []   Worsening renal function, preexisting renal disease or dysfunction   Babs Bertin, PharmD Clinical Pharmacist - Resident Pager [phone] 05/22/2014 4:02 PM

## 2014-05-22 NOTE — Progress Notes (Signed)
Patient ID: [name] [name]  female  [id number]    DOB: 1925-05-06    DOA: 05/20/2014  PCP: Josue HectorNYLAND,LEONARD ROBERT, MD  Brief history of present illness [name] [name] is a 80 y.o. female with Past medical history of CAD s/p PCI and CABG, chronic systolic CHF,dual chamber PPM, esophageal dysphagia, CKD stage III-IV,hypothyroidism, renal artery stenosis with bilateral stents . Patient presented with complaints of shortness of breath ongoing for the last 2 weeks, cough and fever. She was recently admitted for CAP last month and evaluated by speech therapy. She was discharge home on augmentin. 2 weeks ago patient was having fever, dyspnea and coughing and was treated with 7daycourse of antibiotic per PCP. Chest x-ray showed new right-sided infiltrate with pleural effusion, patient was admitted for further workup.  Assessment/Plan: Principal Problem:   Recurrent pneumonia/   HCAP (healthcare-associated pneumonia) - Continue vancomycin and cefepime, - Follow sputum culture, urine antigen and antigen, urine strep antigen negative, f/u blood cultures -Passed Swallow evaluation-> regular diet - Up with assistance, PT evaluation pending  Active Problems: Right lower extremity swelling - Doppler ultrasound negative for DVT  Moderate malnutrition - Nutrition consult placed    Essential hypertension - Stable  CAD, Chronic systolic CHF (congestive heart failure) - Currently euvolemic, continue home dose of Lasix    Hypothyroidism - Continue Synthroid    CKD (chronic kidney disease), stage III - Stable at baseline    PAF- no Coumadin secondary to falls. - Continue amiodarone    Esophageal dysphagia - Swallow evaluation-> regular diet  DVT Prophylaxis:  Code Status: DO NOT RESUSCITATE  Family Communication:  Disposition:  Consultants:  None  Procedures:  None  Antibiotics:  Vancomycin  Cefepime  Subjective: Patient seen and examined, denies any specific  complaints, afebrile, feels a lot better today  Objective: Weight change: 3.3 kg (7 lb 4.4 oz)  Intake/Output Summary (Last 24 hours) at 05/22/14 1117 Last data filed at 05/21/14 1340  Gross per 24 hour  Intake    240 ml  Output      0 ml  Net    240 ml   Blood pressure 105/72, pulse 70, temperature 97.7 F (36.5 C), temperature source Oral, resp. rate 18, height 5' 4.17" (1.63 m), weight 50.2 kg (110 lb 10.7 oz), SpO2 100.00%.  Physical Exam: General: Alert and awake, oriented x3, not in any acute distress. CVS: S1-S2 clear, no murmur rubs or gallops Chest: Decreased breath sound at the bases Abdomen: soft nontender, nondistended, normal bowel sounds  Extremities: no cyanosis, clubbing or edema noted bilaterally Neuro: Cranial nerves II-XII intact, no focal neurological deficits  Lab Results: Basic Metabolic Panel:  Recent Labs Lab 05/21/14 0547 05/22/14 0410  NA 138 136*  K 4.0 4.0  CL 101 99  CO2 23 22  GLUCOSE 79 99  BUN 26* 27*  CREATININE 1.46* 1.34*  CALCIUM 8.6 8.3*   Liver Function Tests:  Recent Labs Lab 05/20/14 1535 05/21/14 0547  AST 15 12  ALT 6 <5  ALKPHOS 77 71  BILITOT 0.8 0.7  PROT 6.5 5.9*  ALBUMIN 3.2* 2.9*   No results found for this basename: LIPASE, AMYLASE,  in the last 168 hours No results found for this basename: AMMONIA,  in the last 168 hours CBC:  Recent Labs Lab 05/21/14 0547 05/22/14 0410  WBC 4.6 4.6  NEUTROABS 3.1  --   HGB 10.3* 10.2*  HCT 32.3* 31.9*  MCV 85.9 86.0  PLT 142* 135*   Cardiac  Enzymes:  Recent Labs Lab 05/20/14 1535  TROPONINI <0.30   BNP: No components found with this basename: POCBNP,  CBG: No results found for this basename: GLUCAP,  in the last 168 hours   Micro Results: No results found for this or any previous visit (from the past 240 hour(s)).  Studies/Results: Dg Chest 2 View  05/20/2014   CLINICAL DATA:  Pain. Weakness. Shortness of breath. Prior infiltrate. Follow-up study.   EXAM: CHEST  2 VIEW  COMPARISON:  04/18/2014.  06/27/2013.  FINDINGS: Mediastinum and hilar structures are normal. Subsegmental atelectasis and/or scarring right mid lung field again noted. New right lower lobe infiltrate with small pleural effusion noted. Previously identified right upper lobe infiltrate is clear. Small left pleural effusion. Cardiomegaly. Prior CABG. Cardiac pacer and lead tips in right atrium right ventricle. No pneumothorax. No acute bony abnormality. Old posttraumatic changes left proximal humerus.  IMPRESSION: 1. New mild infiltrate right lower lobe with small right pleural effusion. 2. Interim clearing of right upper lobe infiltrate. 3. Stable pleural parenchymal scarring right mid lung field. 4. Stable cardiomegaly. Prior CABG. Cardiac pacer in stable position .   Electronically Signed   By: Maisie Fus  Register   On: 05/20/2014 18:02    Medications: Scheduled Meds: . amiodarone  200 mg Oral Daily  . aspirin EC  81 mg Oral Daily  . carvedilol  12.5 mg Oral BID WC  . ceFEPime (MAXIPIME) IV  500 mg Intravenous Q24H  . docusate sodium  100 mg Oral Daily  . feeding supplement (RESOURCE BREEZE)  1 Container Oral Q1500  . furosemide  40 mg Oral Daily  . guaiFENesin  600 mg Oral BID  . isosorbide mononitrate  15 mg Oral Daily  . lactose free nutrition  237 mL Oral TID BM  . levothyroxine  88 mcg Oral QAC breakfast  . pantoprazole  40 mg Oral BID AC  . tamsulosin  0.4 mg Oral Daily  . vancomycin  500 mg Intravenous Q48H      LOS: 2 days   Petronella Shuford M.D. [facility] 05/22/2014, 11:17 AM Pager: [phone]  If 7PM-7AM, please contact night-coverage [email or web address] Password [email or web address]

## 2014-05-22 NOTE — Evaluation (Signed)
Physical Therapy Evaluation Patient Details Name: [name] [name] MRN: [id number] DOB: Apr 06, 1925 Today's Date: 05/22/2014   History of Present Illness  80 y.o. female admitted with recurrent pneumonia.  Clinical Impression  Pt admitted with above. Pt currently with functional limitations due to the deficits listed below (see PT Problem List). Min guard assist required for transfers and gait with RW 60'.  Pt will benefit from skilled PT to increase their independence and safety with mobility to allow discharge to the venue listed below. Pt has all home equipment needs.  No f/u PT services upon d/c home.      Follow Up Recommendations No PT follow up;Supervision - Intermittent    Equipment Recommendations  None recommended by PT    Recommendations for Other Services       Precautions / Restrictions Precautions Precautions: Other (comment) Precaution Comments: contact-orange      Mobility  Bed Mobility Overal bed mobility: Modified Independent             General bed mobility comments: use of bedrail  Transfers Overall transfer level: Needs assistance Equipment used: Rolling walker (2 wheeled) Transfers: Sit to/from [employer]Stand;Stand Pivot Transfers Sit to Stand: Min guard Stand pivot transfers: Min guard       General transfer comment: verbal cues for safety  Ambulation/Gait Ambulation/Gait assistance: Min guard Ambulation Distance (Feet): 60 Feet Assistive device: Rolling walker (2 wheeled) Gait Pattern/deviations: Decreased stride length Gait velocity: decreased      Stairs            Wheelchair Mobility    Modified Rankin (Stroke Patients Only)       Balance Overall balance assessment: Needs assistance Sitting-balance support: No upper extremity supported Sitting balance-Leahy Scale: Good     Standing balance support: Bilateral upper extremity supported Standing balance-Leahy Scale: Fair                                Pertinent Vitals/Pain Pain Assessment: No/denies pain    Home Living Family/patient expects to be discharged to:: Private residence   Available Help at Discharge: Family;Available PRN/intermittently Type of Home: House Home Access: Stairs to enter Entrance Stairs-Rails: Right Entrance Stairs-Number of Steps: 1 Home Layout: One level Home Equipment: Walker - 2 wheels;Shower seat;Bedside commode;Cane - single point      Prior Function Level of Independence: Independent with assistive device(s)               Hand Dominance        Extremity/Trunk Assessment   Upper Extremity Assessment: Generalized weakness           Lower Extremity Assessment: Generalized weakness         Communication   Communication: No difficulties  Cognition Arousal/Alertness: Awake/alert Behavior During Therapy: WFL for tasks assessed/performed Overall Cognitive Status: Within Functional Limits for tasks assessed                      General Comments      Exercises        Assessment/Plan    PT Assessment Patient needs continued PT services  PT Diagnosis Generalized weakness   PT Problem List Decreased activity tolerance;Decreased strength;Decreased mobility;Decreased balance;Decreased safety awareness;Cardiopulmonary status limiting activity  PT Treatment Interventions Gait training;Stair training;Functional mobility training;Therapeutic activities;Therapeutic exercise;Patient/family education;Balance training   PT Goals (Current goals can be found in the Care Plan section) Acute Rehab PT Goals Patient Stated Goal: home  PT Goal Formulation: With patient Time For Goal Achievement: 06/05/14 Potential to Achieve Goals: Good    Frequency Min 3X/week   Barriers to discharge        Co-evaluation               End of Session Equipment Utilized During Treatment: Gait belt Activity Tolerance: Patient tolerated treatment well Patient left: in chair            Time: 0814-4818 PT Time Calculation (min): 18 min   Charges:   PT Evaluation $Initial PT Evaluation Tier I: 1 Procedure PT Treatments $Gait Training: 8-22 mins   PT G Codes:          Ilda Foil 05/22/2014, 12:07 PM

## 2014-05-23 MED ORDER — BENZONATATE 100 MG PO CAPS: 100.0000 mg | ORAL_CAPSULE | Freq: Three times a day (TID) | ORAL | Status: AC | PRN

## 2014-05-23 MED ORDER — LEVOFLOXACIN 750 MG PO TABS: 750.0000 mg | ORAL_TABLET | ORAL | Status: DC

## 2014-05-23 MED ORDER — GUAIFENESIN ER 600 MG PO TB12
600.0000 mg | ORAL_TABLET | Freq: Two times a day (BID) | ORAL | Status: DC
Start: 2014-05-23 — End: 2014-05-23

## 2014-05-23 MED ORDER — LEVOFLOXACIN 500 MG PO TABS: 500.0000 mg | ORAL_TABLET | ORAL | Status: DC

## 2014-05-23 MED ORDER — LEVOFLOXACIN 750 MG PO TABS
750.0000 mg | ORAL_TABLET | Freq: Once | ORAL | Status: AC
  Administered 2014-05-23: 750 mg via ORAL
  Filled 2014-05-23: qty 1

## 2014-05-23 MED ORDER — GUAIFENESIN ER 600 MG PO TB12: 600.0000 mg | ORAL_TABLET | Freq: Two times a day (BID) | ORAL | Status: AC

## 2014-05-23 NOTE — Discharge Summary (Signed)
Physician Discharge Summary  Patient ID: [name] [name] MRN: [id number] DOB/AGE: [birth year] 80 y.o.  Admit date: 05/20/2014 Discharge date: 05/23/2014  Primary Care Physician:  Josue Hector, MD  Discharge Diagnoses:    . HCAP (healthcare-associated pneumonia) . PAF- no Coumadin secondary to falls. . Hypothyroidism . Essential hypertension . CKD (chronic kidney disease), stage III . Coronary atherosclerosis . Cardiac pacemaker in situ- MDT Feb 2013 . Chronic systolic CHF (congestive heart failure) . Esophageal dysphagia . Moderate malnutrition  Consults: None   Recommendations for Outpatient Follow-up:  Follow with chest x-ray in 3 weeks for complete resolution of pneumonia  Please check CBC, BMET for renal function  Allergies:   Allergies  Allergen Reactions  . Heparin Other (See Comments)    Clots blood instead of thinning  . Hydromorphone Hcl Other (See Comments)    Crazy feeling  . Warfarin And Related Other (See Comments)    Syncope events  . Altace [Ramipril] Hives  . Contrast Media [Iodinated Diagnostic Agents] Other (See Comments)    Reaction noted by md- might have caused aneurism in 1999  . Morphine And Related Nausea And Vomiting  . Plavix [Clopidogrel Bisulfate] Hives and Itching     Discharge Medications:   Medication List         amiodarone 200 MG tablet  Commonly known as:  PACERONE  Take 200 mg by mouth daily.     aspirin EC 81 MG tablet  Take 81 mg by mouth daily.     benzonatate 100 MG capsule  Commonly known as:  TESSALON PERLES  Take 1 capsule (100 mg total) by mouth 3 (three) times daily as needed for cough.     carvedilol 12.5 MG tablet  Commonly known as:  COREG  Take 1 tablet (12.5 mg total) by mouth 2 (two) times daily with a meal.     cyanocobalamin 1000 MCG/ML injection  Commonly known as:  (VITAMIN B-12)  Inject 1,000 mcg into the muscle every 30 (thirty) days. Done at Dr. Joyce Copa office     docusate sodium  100 MG capsule  Commonly known as:  COLACE  Take 100 mg by mouth daily.     furosemide 40 MG tablet  Commonly known as:  LASIX  Take 40 mg by mouth daily.     guaiFENesin 600 MG 12 hr tablet  Commonly known as:  MUCINEX  Take 1 tablet (600 mg total) by mouth 2 (two) times daily.     HYDROcodone-acetaminophen 10-325 MG per tablet  Commonly known as:  NORCO  Take 1 tablet by mouth every 6 (six) hours. scheduled     isosorbide mononitrate 15 mg Tb24 24 hr tablet  Commonly known as:  IMDUR  Take 0.5 tablets (15 mg total) by mouth daily.     lactose free nutrition Liqd  Take 237 mLs by mouth 3 (three) times daily between meals.     levofloxacin 500 MG tablet  Commonly known as:  LEVAQUIN  Take 1 tablet (500 mg total) by mouth every other day. For 3 more doses  Start taking on:  05/25/2014     levothyroxine 88 MCG tablet  Commonly known as:  SYNTHROID, LEVOTHROID  Take 88 mcg by mouth daily before breakfast.     LORazepam 0.5 MG tablet  Commonly known as:  ATIVAN  Take 0.5 mg by mouth daily.     nitroGLYCERIN 0.4 MG SL tablet  Commonly known as:  NITROSTAT  Place 0.4 mg under the tongue every 5 (  five) minutes as needed for chest pain.     omeprazole 40 MG capsule  Commonly known as:  PRILOSEC  Take 1 capsule (40 mg total) by mouth daily.     potassium chloride 10 MEQ tablet  Commonly known as:  K-DUR,KLOR-CON  Take 10 mEq by mouth daily.     tamsulosin 0.4 MG Caps capsule  Commonly known as:  FLOMAX  Take 1 capsule (0.4 mg total) by mouth daily.         Brief H and P: For complete details please refer to admission H and P, but in brief [name] [name] is a 80 y.o. female with Past medical history of CAD s/p PCI and CABG, chronic systolic CHF,dual chamber PPM, esophageal dysphagia, CKD stage III-IV,hypothyroidism, renal artery stenosis with bilateral stents .  The pt presented with complaints of shortness of breath ongoing since last 2 week. She has been having  cough and fever as well. She was recently admitted for CAP and evaluated by speech therapy. She was discharge home on augmentin. She mentioned that 2 weeks ago she started with fever, cough and sob, and was treated by her PCP if 7 day course of antibiotic which she completed 2days before the admission. Despite which her shortness of breath and cough has not improved and she also had a fever and therefore she decided to come to the hospital.  She also has been having progressively worsening generalized weakness and right foot swelling with pain.  The swelling of the right foot she has noted since last 2 days but denies any leg tenderness on the calf. No redness or injury reported on the foot.   Hospital Course:  [name] [name] is a 80 y.o. female with Past medical history of CAD s/p PCI and CABG, chronic systolic CHF,dual chamber PPM, esophageal dysphagia, CKD stage III-IV,hypothyroidism, renal artery stenosis with bilateral stents . Patient presented with complaints of shortness of breath ongoing for the last 2 weeks, cough and fever.  She was recently admitted for CAP last month and evaluated by speech therapy. She was discharge home on augmentin. 2 weeks ago patient was having fever, dyspnea and coughing and was treated with 7daycourse of antibiotic per PCP.  Chest x-ray showed new right-sided infiltrate with pleural effusion, patient was admitted for further workup  Recurrent pneumonia/ HCAP (healthcare-associated pneumonia) : Patient was placed vancomycin and cefepime. Speech therapy was consulted and patient has the swallow evaluation. Urine strep antigen was negative, blood cultures remained negative. Urine legionella antigen was negative. Patient has significantly improved and ambulated without any difficulty with PT. She is been tolerating diet without any difficulty. Patient was transitioned to oral Levaquin to complete the course for 8days.   Right lower extremity swelling - Doppler  ultrasound negative for DVT   Moderate malnutrition - patient was placed on nutritional supplements.   Essential hypertension - Stable   CAD, Chronic systolic CHF (congestive heart failure) - Currently euvolemic, continue home dose of Lasix   Hypothyroidism - Continue Synthroid   CKD (chronic kidney disease), stage III - Stable at baseline   PAF- no Coumadin secondary to falls. - Continue amiodarone   Day of Discharge BP 148/71  Pulse 70  Temp(Src) 97.8 F (36.6 C) (Oral)  Resp 16  Ht 5' 4.17" (1.63 m)  Wt 52.708 kg (116 lb 3.2 oz)  BMI 19.84 kg/m2  SpO2 100%  Physical Exam: General: Alert and awake oriented x3 not in any acute distress. CVS: S1-S2 clear no murmur  rubs or gallops Chest: clear to auscultation bilaterally, no wheezing rales or rhonchi Abdomen: soft nontender, nondistended, normal bowel sounds Extremities: no cyanosis, clubbing or edema noted bilaterally    The results of significant diagnostics from this hospitalization (including imaging, microbiology, ancillary and laboratory) are listed below for reference.    LAB RESULTS: Basic Metabolic Panel:  Recent Labs Lab 05/21/14 0547 05/22/14 0410  NA 138 136*  K 4.0 4.0  CL 101 99  CO2 23 22  GLUCOSE 79 99  BUN 26* 27*  CREATININE 1.46* 1.34*  CALCIUM 8.6 8.3*   Liver Function Tests:  Recent Labs Lab 05/20/14 1535 05/21/14 0547  AST 15 12  ALT 6 <5  ALKPHOS 77 71  BILITOT 0.8 0.7  PROT 6.5 5.9*  ALBUMIN 3.2* 2.9*   No results found for this basename: LIPASE, AMYLASE,  in the last 168 hours No results found for this basename: AMMONIA,  in the last 168 hours CBC:  Recent Labs Lab 05/21/14 0547 05/22/14 0410  WBC 4.6 4.6  NEUTROABS 3.1  --   HGB 10.3* 10.2*  HCT 32.3* 31.9*  MCV 85.9 86.0  PLT 142* 135*   Cardiac Enzymes:  Recent Labs Lab 05/20/14 1535  TROPONINI <0.30   BNP: No components found with this basename: POCBNP,  CBG: No results found for this basename:  GLUCAP,  in the last 168 hours  Significant Diagnostic Studies:  Dg Chest 2 View  05/20/2014   CLINICAL DATA:  Pain. Weakness. Shortness of breath. Prior infiltrate. Follow-up study.  EXAM: CHEST  2 VIEW  COMPARISON:  04/18/2014.  06/27/2013.  FINDINGS: Mediastinum and hilar structures are normal. Subsegmental atelectasis and/or scarring right mid lung field again noted. New right lower lobe infiltrate with small pleural effusion noted. Previously identified right upper lobe infiltrate is clear. Small left pleural effusion. Cardiomegaly. Prior CABG. Cardiac pacer and lead tips in right atrium right ventricle. No pneumothorax. No acute bony abnormality. Old posttraumatic changes left proximal humerus.  IMPRESSION: 1. New mild infiltrate right lower lobe with small right pleural effusion. 2. Interim clearing of right upper lobe infiltrate. 3. Stable pleural parenchymal scarring right mid lung field. 4. Stable cardiomegaly. Prior CABG. Cardiac pacer in stable position .   Electronically Signed   By: Maisie Fus  Register   On: 05/20/2014 18:02     Disposition and Follow-up:     Discharge Instructions   Diet - low sodium heart healthy    Complete by:  As directed      Increase activity slowly    Complete by:  As directed             DISPOSITION: Home  DIET: Heart healthy diet   DISCHARGE FOLLOW-UP Follow-up Information   Follow up with Josue Hector, MD. Schedule an appointment as soon as possible for a visit in 10 days. (for hospital follow-up, obtain labs for renal function)    Specialty:  Family Medicine   Contact information:   [address] [place] [address] [phone]       Time spent on Discharge: 40 mins  Signed:   RAI,RIPUDEEP M.D. [facility] 05/23/2014, 10:04 AM Pager: [phone]

## 2014-05-27 LAB — CULTURE, BLOOD (ROUTINE X 2)
CULTURE: NO GROWTH
Culture: NO GROWTH

## 2014-06-12 ENCOUNTER — Encounter (HOSPITAL_COMMUNITY): Payer: Self-pay | Admitting: Emergency Medicine

## 2014-06-12 ENCOUNTER — Emergency Department (HOSPITAL_COMMUNITY): Payer: Medicare Other

## 2014-06-12 ENCOUNTER — Inpatient Hospital Stay (HOSPITAL_COMMUNITY)
Admission: EM | Admit: 2014-06-12 | Discharge: 2014-06-15 | DRG: 556 | Disposition: A | Discharge status: Skilled Nursing Facility | Payer: Medicare Other | Attending: Internal Medicine | Admitting: Internal Medicine

## 2014-06-12 DIAGNOSIS — M25552 Pain in left hip: Principal | ICD-10-CM | POA: Diagnosis present

## 2014-06-12 DIAGNOSIS — H532 Diplopia: Secondary | ICD-10-CM | POA: Diagnosis not present

## 2014-06-12 DIAGNOSIS — I5022 Chronic systolic (congestive) heart failure: Secondary | ICD-10-CM | POA: Diagnosis not present

## 2014-06-12 DIAGNOSIS — I2510 Atherosclerotic heart disease of native coronary artery without angina pectoris: Secondary | ICD-10-CM | POA: Diagnosis not present

## 2014-06-12 DIAGNOSIS — Z9181 History of falling: Secondary | ICD-10-CM

## 2014-06-12 DIAGNOSIS — Z951 Presence of aortocoronary bypass graft: Secondary | ICD-10-CM

## 2014-06-12 DIAGNOSIS — Z23 Encounter for immunization: Secondary | ICD-10-CM | POA: Diagnosis not present

## 2014-06-12 DIAGNOSIS — I129 Hypertensive chronic kidney disease with stage 1 through stage 4 chronic kidney disease, or unspecified chronic kidney disease: Secondary | ICD-10-CM | POA: Diagnosis present

## 2014-06-12 DIAGNOSIS — J449 Chronic obstructive pulmonary disease, unspecified: Secondary | ICD-10-CM | POA: Diagnosis present

## 2014-06-12 DIAGNOSIS — R55 Syncope and collapse: Secondary | ICD-10-CM

## 2014-06-12 DIAGNOSIS — F329 Major depressive disorder, single episode, unspecified: Secondary | ICD-10-CM | POA: Diagnosis not present

## 2014-06-12 DIAGNOSIS — Z79891 Long term (current) use of opiate analgesic: Secondary | ICD-10-CM

## 2014-06-12 DIAGNOSIS — Y92009 Unspecified place in unspecified non-institutional (private) residence as the place of occurrence of the external cause: Secondary | ICD-10-CM | POA: Diagnosis not present

## 2014-06-12 DIAGNOSIS — M25511 Pain in right shoulder: Secondary | ICD-10-CM

## 2014-06-12 DIAGNOSIS — Z66 Do not resuscitate: Secondary | ICD-10-CM | POA: Diagnosis present

## 2014-06-12 DIAGNOSIS — E039 Hypothyroidism, unspecified: Secondary | ICD-10-CM | POA: Diagnosis not present

## 2014-06-12 DIAGNOSIS — M109 Gout, unspecified: Secondary | ICD-10-CM | POA: Diagnosis present

## 2014-06-12 DIAGNOSIS — Z681 Body mass index (BMI) 19 or less, adult: Secondary | ICD-10-CM

## 2014-06-12 DIAGNOSIS — S0101XA Laceration without foreign body of scalp, initial encounter: Secondary | ICD-10-CM | POA: Diagnosis not present

## 2014-06-12 DIAGNOSIS — I255 Ischemic cardiomyopathy: Secondary | ICD-10-CM | POA: Diagnosis present

## 2014-06-12 DIAGNOSIS — E440 Moderate protein-calorie malnutrition: Secondary | ICD-10-CM | POA: Diagnosis present

## 2014-06-12 DIAGNOSIS — W010XXA Fall on same level from slipping, tripping and stumbling without subsequent striking against object, initial encounter: Secondary | ICD-10-CM | POA: Diagnosis not present

## 2014-06-12 DIAGNOSIS — I4891 Unspecified atrial fibrillation: Secondary | ICD-10-CM | POA: Diagnosis present

## 2014-06-12 DIAGNOSIS — K219 Gastro-esophageal reflux disease without esophagitis: Secondary | ICD-10-CM | POA: Diagnosis present

## 2014-06-12 DIAGNOSIS — S0990XA Unspecified injury of head, initial encounter: Secondary | ICD-10-CM

## 2014-06-12 DIAGNOSIS — N183 Chronic kidney disease, stage 3 unspecified: Secondary | ICD-10-CM | POA: Diagnosis present

## 2014-06-12 DIAGNOSIS — Z950 Presence of cardiac pacemaker: Secondary | ICD-10-CM

## 2014-06-12 DIAGNOSIS — Z79899 Other long term (current) drug therapy: Secondary | ICD-10-CM | POA: Diagnosis not present

## 2014-06-12 DIAGNOSIS — T68XXXA Hypothermia, initial encounter: Secondary | ICD-10-CM

## 2014-06-12 DIAGNOSIS — N179 Acute kidney failure, unspecified: Secondary | ICD-10-CM | POA: Diagnosis not present

## 2014-06-12 DIAGNOSIS — Z7982 Long term (current) use of aspirin: Secondary | ICD-10-CM

## 2014-06-12 DIAGNOSIS — R0609 Other forms of dyspnea: Secondary | ICD-10-CM

## 2014-06-12 DIAGNOSIS — R269 Unspecified abnormalities of gait and mobility: Secondary | ICD-10-CM

## 2014-06-12 DIAGNOSIS — W19XXXA Unspecified fall, initial encounter: Secondary | ICD-10-CM | POA: Diagnosis present

## 2014-06-12 DIAGNOSIS — D649 Anemia, unspecified: Secondary | ICD-10-CM | POA: Diagnosis present

## 2014-06-12 DIAGNOSIS — I5020 Unspecified systolic (congestive) heart failure: Secondary | ICD-10-CM

## 2014-06-12 DIAGNOSIS — R531 Weakness: Secondary | ICD-10-CM

## 2014-06-12 LAB — BASIC METABOLIC PANEL
Anion gap: 19 — ABNORMAL HIGH (ref 5–15)
Anion gap: 20 — ABNORMAL HIGH (ref 5–15)
BUN: 42 mg/dL — AB (ref 6–23)
BUN: 43 mg/dL — AB (ref 6–23)
CO2: 19 mEq/L (ref 19–32)
CO2: 18 meq/L — AB (ref 19–32)
Calcium: 9.2 mg/dL (ref 8.4–10.5)
Calcium: 9.2 mg/dL (ref 8.4–10.5)
Chloride: 94 mEq/L — ABNORMAL LOW (ref 96–112)
Chloride: 97 mEq/L (ref 96–112)
Creatinine, Ser: 1.93 mg/dL — ABNORMAL HIGH (ref 0.50–1.10)
Creatinine, Ser: 1.91 mg/dL — ABNORMAL HIGH (ref 0.50–1.10)
GFR calc Af Amer: 26 mL/min — ABNORMAL LOW (ref >90)
GFR calc Af Amer: 26 mL/min — ABNORMAL LOW (ref >90)
GFR calc non Af Amer: 22 mL/min — ABNORMAL LOW (ref >90)
GFR calc non Af Amer: 23 mL/min — ABNORMAL LOW (ref >90)
GLUCOSE: 117 mg/dL — AB (ref 70–99)
GLUCOSE: 107 mg/dL — AB (ref 70–99)
POTASSIUM: 5 meq/L (ref 3.7–5.3)
POTASSIUM: 4.9 meq/L (ref 3.7–5.3)
Sodium: 132 mEq/L — ABNORMAL LOW (ref 137–147)
Sodium: 135 mEq/L — ABNORMAL LOW (ref 137–147)

## 2014-06-12 LAB — URINALYSIS, ROUTINE W REFLEX MICROSCOPIC
Bilirubin Urine: NEGATIVE
GLUCOSE, UA: NEGATIVE mg/dL
Hgb urine dipstick: NEGATIVE
KETONES UR: NEGATIVE mg/dL
Leukocytes, UA: NEGATIVE
Nitrite: NEGATIVE
Protein, ur: NEGATIVE mg/dL
Specific Gravity, Urine: 1.014 (ref 1.005–1.030)
Urobilinogen, UA: 0.2 mg/dL (ref 0.0–1.0)
pH: 5 (ref 5.0–8.0)

## 2014-06-12 LAB — CBC
HEMATOCRIT: 35.6 % — AB (ref 36.0–46.0)
HEMOGLOBIN: 11.5 g/dL — AB (ref 12.0–15.0)
MCH: 26.3 pg (ref 26.0–34.0)
MCHC: 32.3 g/dL (ref 30.0–36.0)
MCV: 81.5 fL (ref 78.0–100.0)
Platelets: 160 10*3/uL (ref 150–400)
RBC: 4.37 MIL/uL (ref 3.87–5.11)
RDW: 18.9 % — ABNORMAL HIGH (ref 11.5–15.5)
WBC: 7.5 10*3/uL (ref 4.0–10.5)

## 2014-06-12 MED ORDER — FENTANYL CITRATE 0.05 MG/ML IJ SOLN
25.0000 ug | Freq: Once | INTRAMUSCULAR | Status: AC
  Administered 2014-06-12: 25 ug via INTRAVENOUS
  Filled 2014-06-12: qty 2

## 2014-06-12 MED ORDER — ASPIRIN EC 81 MG PO TBEC
81.0000 mg | DELAYED_RELEASE_TABLET | Freq: Every day | ORAL | Status: DC
Start: 2014-06-12 — End: 2014-06-15
  Administered 2014-06-12 – 2014-06-15 (×4): 81 mg via ORAL
  Filled 2014-06-12 (×4): qty 1

## 2014-06-12 MED ORDER — SODIUM CHLORIDE 0.9 % IV BOLUS (SEPSIS)
500.0000 mL | Freq: Once | INTRAVENOUS | Status: AC
  Administered 2014-06-12: 500 mL via INTRAVENOUS

## 2014-06-12 MED ORDER — AMIODARONE HCL 200 MG PO TABS
200.0000 mg | ORAL_TABLET | Freq: Every day | ORAL | Status: DC
  Administered 2014-06-12 – 2014-06-15 (×4): 200 mg via ORAL
  Filled 2014-06-12 (×4): qty 1

## 2014-06-12 MED ORDER — LORAZEPAM 0.5 MG PO TABS
0.5000 mg | ORAL_TABLET | Freq: Every day | ORAL | Status: DC
  Administered 2014-06-12 – 2014-06-14 (×3): 0.5 mg via ORAL
  Filled 2014-06-12 (×3): qty 1

## 2014-06-12 MED ORDER — TETANUS-DIPHTH-ACELL PERTUSSIS 5-2.5-18.5 LF-MCG/0.5 IM SUSP
0.5000 mL | Freq: Once | INTRAMUSCULAR | Status: AC
  Administered 2014-06-12: 0.5 mL via INTRAMUSCULAR
  Filled 2014-06-12: qty 0.5

## 2014-06-12 MED ORDER — HYDROCODONE-ACETAMINOPHEN 10-325 MG PO TABS: 1.0000 | ORAL_TABLET | Freq: Four times a day (QID) | ORAL | Status: DC

## 2014-06-12 MED ORDER — CARVEDILOL 12.5 MG PO TABS
12.5000 mg | ORAL_TABLET | Freq: Two times a day (BID) | ORAL | Status: DC
  Administered 2014-06-12: 12.5 mg via ORAL
  Filled 2014-06-12 (×6): qty 1

## 2014-06-12 MED ORDER — BOOST PO LIQD
237.0000 mL | Freq: Three times a day (TID) | ORAL | Status: DC
  Administered 2014-06-12 – 2014-06-14 (×5): 237 mL via ORAL
  Filled 2014-06-12 (×9): qty 237

## 2014-06-12 MED ORDER — GUAIFENESIN ER 600 MG PO TB12
600.0000 mg | ORAL_TABLET | Freq: Two times a day (BID) | ORAL | Status: DC
  Administered 2014-06-12 – 2014-06-15 (×5): 600 mg via ORAL
  Filled 2014-06-12 (×7): qty 1

## 2014-06-12 MED ORDER — PANTOPRAZOLE SODIUM 40 MG PO TBEC
80.0000 mg | DELAYED_RELEASE_TABLET | Freq: Every day | ORAL | Status: DC
Start: 2014-06-12 — End: 2014-06-14
  Administered 2014-06-12 – 2014-06-13 (×2): 80 mg via ORAL
  Filled 2014-06-12 (×2): qty 2

## 2014-06-12 MED ORDER — LORAZEPAM 0.5 MG PO TABS: 0.5000 mg | ORAL_TABLET | Freq: Every day | ORAL | Status: DC

## 2014-06-12 MED ORDER — CYANOCOBALAMIN 1000 MCG/ML IJ SOLN: 1000.0000 ug | INTRAMUSCULAR | Status: DC

## 2014-06-12 MED ORDER — LEVOTHYROXINE SODIUM 88 MCG PO TABS
88.0000 ug | ORAL_TABLET | Freq: Every day | ORAL | Status: DC
  Administered 2014-06-13 – 2014-06-15 (×3): 88 ug via ORAL
  Filled 2014-06-12 (×4): qty 1

## 2014-06-12 MED ORDER — HYDROCODONE-ACETAMINOPHEN 10-325 MG PO TABS
1.0000 | ORAL_TABLET | Freq: Four times a day (QID) | ORAL | Status: DC
  Administered 2014-06-12 – 2014-06-14 (×6): 1 via ORAL
  Filled 2014-06-12 (×6): qty 1

## 2014-06-12 MED ORDER — PANTOPRAZOLE SODIUM 40 MG PO TBEC: 80.0000 mg | DELAYED_RELEASE_TABLET | Freq: Every day | ORAL | Status: DC

## 2014-06-12 MED ORDER — BENZONATATE 100 MG PO CAPS: 100.0000 mg | ORAL_CAPSULE | Freq: Three times a day (TID) | ORAL | Status: DC | PRN

## 2014-06-12 MED ORDER — TAMSULOSIN HCL 0.4 MG PO CAPS
0.4000 mg | ORAL_CAPSULE | Freq: Every day | ORAL | Status: DC
  Administered 2014-06-13 – 2014-06-15 (×3): 0.4 mg via ORAL
  Filled 2014-06-12 (×4): qty 1

## 2014-06-12 MED ORDER — HYDROCODONE-ACETAMINOPHEN 5-325 MG PO TABS
2.0000 | ORAL_TABLET | Freq: Once | ORAL | Status: AC
  Administered 2014-06-12: 2 via ORAL
  Filled 2014-06-12: qty 2

## 2014-06-12 MED ORDER — ISOSORBIDE MONONITRATE 15 MG HALF TABLET
15.0000 mg | ORAL_TABLET | Freq: Every day | ORAL | Status: DC
  Administered 2014-06-12 – 2014-06-13 (×2): 15 mg via ORAL
  Filled 2014-06-12 (×3): qty 1

## 2014-06-12 MED ORDER — DOCUSATE SODIUM 100 MG PO CAPS
100.0000 mg | ORAL_CAPSULE | Freq: Every day | ORAL | Status: DC
  Administered 2014-06-12 – 2014-06-15 (×4): 100 mg via ORAL
  Filled 2014-06-12 (×3): qty 1

## 2014-06-12 NOTE — H&P (Addendum)
[facility] History and Physical  [name] [name]:[id number] DOB: 02/13/1925 DOA: 06/12/2014  Referring physician: EDP PCP: Josue Hector, MD   Chief Complaint: Fall, hip pain   HPI: [name] [name] is a 80 y.o. female who suffered a mechanical fall at home.  She landed on her left side.  She was down for about 6 hours before family members found her and called EMS.  She hit her head on floor but denies LOC.  No lightheadedness, syncope, dizziness, nor chest pain prior to the fall.  Work up in the ED including CT scan of the left hip demonstrated no acute fracture.  Despite this patient is still too painful to bear weight on her left hip.  Review of Systems: Systems reviewed.  As above, otherwise negative  Past Medical History  Diagnosis Date  . Diabetes mellitus without complication   . DJD (degenerative joint disease)   . Cardiomegaly   . Coronary artery disease     a. LAD stent 1999 after NSTEMI. b. CABG 2004. c. Last LHC 02/2010 - patent grafts.  . Hypertension   . Hyperlipidemia   . Carotid artery disease 05/2003    a. s/p right carotid endarterectomy 05/2003  . Degenerative joint disease   . Restless leg syndrome   . CKD (chronic kidney disease), stage III   . Heparin induced thrombocytopenia   . Diphtheria     "as a child"  . Pleural effusion, bilateral 06/2003  . Pseudoaneurysm 01/1998    a. s/p repair following catherization in 01/1998  . Chronic systolic heart failure 10/02/11  . Ischemic cardiomyopathy     Echo 10/01/11: Mild LVH, EF 35-40%, basal inferior, basal to mid posterior and basal to mid anterolateral severe hypokinesis, mild to moderate AI, moderate MR (ischemic MR), moderate LAE, mild RVE, mildly reduced RV systolic function, mild RAE, PASP 43-44  . COPD (chronic obstructive pulmonary disease)   . Atrial fibrillation/flutter     s/p multiple DCCVs;  amiodarone started 09/2011, TEE DCCV 3/13;  amio and coumadin d/c'd after admxn to [facility]  02/2012 with frequent falls/syncope  . Tachycardia-bradycardia syndrome     a. s/p [employer] PPM 09/2011: SN: [id number] H (8 sec pause noted)  . GERD (gastroesophageal reflux disease)   . Renal artery stenosis     bilateral- status post stenting  . Non-functioning kidney   . Depression     "cries alot"  . Gout   . Hypothyroidism   . Anemia    Past Surgical History  Procedure Laterality Date  . Carotid endarterectomy  05/2003    right  . Pacemaker insertion  10/02/11    implanted by Dr Johney Frame  . Cholecystectomy  ?02/2011  . Tonsillectomy and adenoidectomy    . Renal artery stent  06/2003    bilaterally/e-chart  . Thoracentesis  ? 2000; 05/2011  . Coronary angioplasty with stent placement  1999  . Av fistula repair  01/1998    w/pseudoaneurysm repair S/P catheterization/E-chart  . Coronary artery bypass graft  05/2003    CABG X 3  . Tee without cardioversion  10/23/2011    Procedure: TRANSESOPHAGEAL ECHOCARDIOGRAM (TEE);  Surgeon: Wendall Stade, MD;  Location: [facility] ENDOSCOPY;  Service: Cardiovascular;  Laterality: N/A;  . Cardioversion  10/23/2011    Procedure: CARDIOVERSION;  Surgeon: Wendall Stade, MD;  Location: [facility] ENDOSCOPY;  Service: Cardiovascular;  Laterality: N/A;   Social History:  reports that she has never smoked. She has never used smokeless tobacco. She reports that  she does not drink alcohol or use illicit drugs.  Allergies  Allergen Reactions  . Heparin Other (See Comments)    Clots blood instead of thinning  . Hydromorphone Hcl Other (See Comments)    Crazy feeling  . Warfarin And Related Other (See Comments)    Syncope events  . Altace [Ramipril] Hives  . Contrast Media [Iodinated Diagnostic Agents] Other (See Comments)    Reaction noted by md- might have caused aneurism in 1999  . Morphine And Related Nausea And Vomiting  . Plavix [Clopidogrel Bisulfate] Hives and Itching    Family History  Problem Relation Age of Onset  . Cancer Mother 30    died  .  Lung disease Father 77    died  . Heart disease Sister     2 sisters and her son  . Anesthesia problems Neg Hx      Prior to Admission medications   Medication Sig Start Date End Date Taking? Authorizing Provider  amiodarone (PACERONE) 200 MG tablet Take 200 mg by mouth daily.    Yes Historical Provider, MD  aspirin EC 81 MG tablet Take 81 mg by mouth daily.   Yes Historical Provider, MD  benzonatate (TESSALON PERLES) 100 MG capsule Take 1 capsule (100 mg total) by mouth 3 (three) times daily as needed for cough. 05/23/14  Yes Ripudeep Jenna Luo, MD  carvedilol (COREG) 12.5 MG tablet Take 1 tablet (12.5 mg total) by mouth 2 (two) times daily with a meal. 03/10/14  Yes Rollene Rotunda, MD  cyanocobalamin (,VITAMIN B-12,) 1000 MCG/ML injection Inject 1,000 mcg into the muscle every 30 (thirty) days. Done at Dr. Joyce Copa office   Yes Historical Provider, MD  docusate sodium (COLACE) 100 MG capsule Take 100 mg by mouth daily.    Yes Historical Provider, MD  furosemide (LASIX) 40 MG tablet Take 40 mg by mouth daily.  02/06/14  Yes Esperanza Sheets, MD  guaiFENesin (MUCINEX) 600 MG 12 hr tablet Take 1 tablet (600 mg total) by mouth 2 (two) times daily. 05/23/14  Yes Ripudeep Jenna Luo, MD  HYDROcodone-acetaminophen (NORCO) 10-325 MG per tablet Take 1 tablet by mouth every 6 (six) hours. scheduled 02/08/14  Yes Esperanza Sheets, MD  isosorbide mononitrate (IMDUR) 15 mg TB24 24 hr tablet Take 0.5 tablets (15 mg total) by mouth daily. 02/06/14  Yes Esperanza Sheets, MD  lactose free nutrition (BOOST) LIQD Take 237 mLs by mouth 3 (three) times daily between meals.   Yes Historical Provider, MD  levothyroxine (SYNTHROID, LEVOTHROID) 88 MCG tablet Take 88 mcg by mouth daily before breakfast.   Yes Historical Provider, MD  LORazepam (ATIVAN) 0.5 MG tablet Take 0.5 mg by mouth daily.   Yes Historical Provider, MD  nitroGLYCERIN (NITROSTAT) 0.4 MG SL tablet Place 0.4 mg under the tongue every 5 (five) minutes as needed  for chest pain.   Yes Historical Provider, MD  omeprazole (PRILOSEC) 40 MG capsule Take 1 capsule (40 mg total) by mouth daily. 11/27/12  Yes Rhonda G Barrett, PA-C  potassium chloride (K-DUR,KLOR-CON) 10 MEQ tablet Take 10 mEq by mouth daily.   Yes Historical Provider, MD  Tamsulosin HCl (FLOMAX) 0.4 MG CAPS Take 1 capsule (0.4 mg total) by mouth daily. 08/29/12  Yes Laurann Montana, PA-C   Physical Exam: Filed Vitals:   06/12/14 1830  BP: 147/69  Pulse: 69  Temp:   Resp: 18    BP 147/69  Pulse 69  Temp(Src) 97.8 F (36.6 C) (Oral)  Resp 18  Ht 5\' 4"  (1.626 m)  Wt 49.896 kg (110 lb)  BMI 18.87 kg/m2  SpO2 100%  General Appearance:    Alert, oriented, no distress, appears stated age  Head:    Normocephalic, atraumatic  Eyes:    PERRL, EOMI, sclera non-icteric        Nose:   Nares without drainage or epistaxis. Mucosa, turbinates normal  Throat:   Moist mucous membranes. Oropharynx without erythema or exudate.  Neck:   Supple. No carotid bruits.  No thyromegaly.  No lymphadenopathy.   Back:     No CVA tenderness, no spinal tenderness  Lungs:     Clear to auscultation bilaterally, without wheezes, rhonchi or rales  Chest wall:    No tenderness to palpitation  Heart:    Regular rate and rhythm without murmurs, gallops, rubs  Abdomen:     Soft, non-tender, nondistended, normal bowel sounds, no organomegaly  Genitalia:    deferred  Rectal:    deferred  Extremities:   Unable to ambulate on L hip due to pain, doesn't have much pain with passive ROM however.  2+ pitting edema BLE.  Pulses:   2+ and symmetric all extremities  Skin:   Skin color, texture, turgor normal, no rashes or lesions  Lymph nodes:   Cervical, supraclavicular, and axillary nodes normal  Neurologic:   Patient having difficulty with finger counting.    Labs on Admission:  Basic Metabolic Panel:  Recent Labs Lab 06/12/14 1210 06/12/14 1317  NA 132* 135*  K 5.0 4.9  CL 94* 97  CO2 19 18*  GLUCOSE 117* 107*   BUN 42* 43*  CREATININE 1.93* 1.91*  CALCIUM 9.2 9.2   Liver Function Tests: No results found for this basename: AST, ALT, ALKPHOS, BILITOT, PROT, ALBUMIN,  in the last 168 hours No results found for this basename: LIPASE, AMYLASE,  in the last 168 hours No results found for this basename: AMMONIA,  in the last 168 hours CBC:  Recent Labs Lab 06/12/14 1210  WBC 7.5  HGB 11.5*  HCT 35.6*  MCV 81.5  PLT 160   Cardiac Enzymes: No results found for this basename: CKTOTAL, CKMB, CKMBINDEX, TROPONINI,  in the last 168 hours  BNP (last 3 results)  Recent Labs  01/26/14 1030 04/17/14 0447 05/20/14 1535  PROBNP 9027.0* 30005.0* 26328.0*   CBG: No results found for this basename: GLUCAP,  in the last 168 hours  Radiological Exams on Admission: Dg Chest 2 View  06/12/2014   CLINICAL DATA:  Fall 1 day prior. History of atrial fibrillation. Hypertension.  EXAM: CHEST  2 VIEW  COMPARISON:  May 20, 2014  FINDINGS: There is scarring in the right mid lung and left base regions. There is no frank edema or consolidation. The heart is slightly enlarged with pulmonary vascularity within normal limits. Pacemaker leads are attached to the right atrium and right ventricle. Patient is status post coronary artery bypass grafting. No adenopathy. No pneumothorax. There is arthropathy in both shoulders.  IMPRESSION: Areas of lung scarring bilaterally. No edema or consolidation. Heart prominent but stable.   Electronically Signed   By: Bretta BangWilliam  Woodruff M.D.   On: 06/12/2014 15:00   Dg Lumbar Spine Complete  06/12/2014   CLINICAL DATA:  Low back pain following fall earlier in the day  EXAM: LUMBAR SPINE - COMPLETE 4+ VIEW  COMPARISON:  February 19, 2012  FINDINGS: Frontal, lateral, spot lumbosacral lateral, and bilateral oblique views were obtained. There are 5  non-rib-bearing lumbar type vertebral bodies. There is no fracture or spondylolisthesis. There is moderately severe disc space narrowing at L4-5  and L5-S1. There is mild disc space narrowing at T12-L1 and L1-2. There is facet osteoarthritic change at all levels bilaterally. There is atherosclerotic change in the aorta as well as in the major mesenteric arteries. There are stents in each renal artery. Bones are osteoporotic.  IMPRESSION: Bones are osteoporotic. There is multilevel osteoarthritic change. No fracture or spondylolisthesis. Extensive atherosclerotic calcification.   Electronically Signed   By: Bretta Bang M.D.   On: 06/12/2014 13:04   Dg Shoulder Right  06/12/2014   CLINICAL DATA:  Fall with right shoulder pain.  EXAM: RIGHT SHOULDER - 2+ VIEW  COMPARISON:  None.  FINDINGS: No acute fracture or dislocation is identified. There are advanced changes of osteoarthritis involving the glenohumeral joint. Milder degenerative changes are seen involving the [facility] joint. The [facility] joint itself shows no obvious sprain or separation. There is suggestion of soft tissue swelling overlying the distal clavicle and acromion. Sclerotic bone island present in the posterior humeral head.  IMPRESSION: No acute fracture. Significant degenerative osteoarthritis of the glenohumeral joint. Soft tissue swelling overlying the AC joint may relate to focal soft tissue injury.   Electronically Signed   By: Irish Lack M.D.   On: 06/12/2014 13:01   Dg Hip Complete Left  06/12/2014   CLINICAL DATA:  Fall this morning.  EXAM: LEFT HIP - COMPLETE 2+ VIEW  COMPARISON:  None.  FINDINGS: Osteopenia. No acute fracture. No dislocation. Degenerative changes in the lumbar spine.  IMPRESSION: No acute bony pathology.   Electronically Signed   By: Maryclare Bean M.D.   On: 06/12/2014 13:01   Ct Head Wo Contrast  06/12/2014   CLINICAL DATA:  Patient fell and found on floor. Complaining of headache and neck pain  EXAM: CT HEAD WITHOUT CONTRAST  CT CERVICAL SPINE WITHOUT CONTRAST  TECHNIQUE: Multidetector CT imaging of the head and cervical spine was performed following the  standard protocol without intravenous contrast. Multiplanar CT image reconstructions of the cervical spine were also generated.  COMPARISON:  Head CT February 04, 2014  FINDINGS: CT HEAD FINDINGS  There is moderate diffuse atrophy. There is no intracranial mass, hemorrhage, extra-axial fluid collection, or midline shift. There is small vessel disease in the centra semiovale bilaterally, more on the left than on the right, stable. There is evidence of prior small vessel disease in the anterior limb of the left external capsule, stable. To a lesser degree there is small vessel disease in the anterior limb of the left internal capsule, stable.  Compared to the prior study, there is decreased attenuation in the mid and posterior superior left temporal lobe extending to the left temple-occipital junction. A recent infarct in this area is of concern. This appearance is best seen on axial slice 14 series 201. No other findings concerning for potential acute infarct identified.  There is a small right occipital scalp hematoma with acute hemorrhage in this area. The bony calvarium appears intact. The mastoid air cells are clear. There is bilateral ethmoid sinus disease.  CT CERVICAL SPINE FINDINGS  There is no fracture or spondylolisthesis. Prevertebral soft tissues and predental space regions are normal.  There is severe disc space narrowing at C5-6. There is moderately severe disc space narrowing at C6-7. There is facet hypertrophy at multiple levels bilaterally. There is no frank disc extrusion or stenosis. Note that there is fairly extensive bony overgrowth at  C1 on the left.  There is marked osteoarthritic change in both temporomandibular joints. There is calcification in each carotid artery.  There are bilateral pleural effusions.  IMPRESSION: CT head: Atrophy with supratentorial small vessel disease. Question recent infarct in the left superior mid to posterior temporal lobe. No intracranial hemorrhage or mass effect.  There is a small right occipital scalp hematoma. There is ethmoid sinus disease bilaterally.  CT cervical spine: Multilevel osteoarthritic change. No fracture or spondylolisthesis. Calcification is noted in each carotid artery.  There are bilateral pleural effusions. Correlation with chest radiograph may be advisable in this regard.   Electronically Signed   By: Bretta Bang M.D.   On: 06/12/2014 13:29   Ct Cervical Spine Wo Contrast  06/12/2014   CLINICAL DATA:  Patient fell and found on floor. Complaining of headache and neck pain  EXAM: CT HEAD WITHOUT CONTRAST  CT CERVICAL SPINE WITHOUT CONTRAST  TECHNIQUE: Multidetector CT imaging of the head and cervical spine was performed following the standard protocol without intravenous contrast. Multiplanar CT image reconstructions of the cervical spine were also generated.  COMPARISON:  Head CT February 04, 2014  FINDINGS: CT HEAD FINDINGS  There is moderate diffuse atrophy. There is no intracranial mass, hemorrhage, extra-axial fluid collection, or midline shift. There is small vessel disease in the centra semiovale bilaterally, more on the left than on the right, stable. There is evidence of prior small vessel disease in the anterior limb of the left external capsule, stable. To a lesser degree there is small vessel disease in the anterior limb of the left internal capsule, stable.  Compared to the prior study, there is decreased attenuation in the mid and posterior superior left temporal lobe extending to the left temple-occipital junction. A recent infarct in this area is of concern. This appearance is best seen on axial slice 14 series 201. No other findings concerning for potential acute infarct identified.  There is a small right occipital scalp hematoma with acute hemorrhage in this area. The bony calvarium appears intact. The mastoid air cells are clear. There is bilateral ethmoid sinus disease.  CT CERVICAL SPINE FINDINGS  There is no fracture or  spondylolisthesis. Prevertebral soft tissues and predental space regions are normal.  There is severe disc space narrowing at C5-6. There is moderately severe disc space narrowing at C6-7. There is facet hypertrophy at multiple levels bilaterally. There is no frank disc extrusion or stenosis. Note that there is fairly extensive bony overgrowth at C1 on the left.  There is marked osteoarthritic change in both temporomandibular joints. There is calcification in each carotid artery.  There are bilateral pleural effusions.  IMPRESSION: CT head: Atrophy with supratentorial small vessel disease. Question recent infarct in the left superior mid to posterior temporal lobe. No intracranial hemorrhage or mass effect. There is a small right occipital scalp hematoma. There is ethmoid sinus disease bilaterally.  CT cervical spine: Multilevel osteoarthritic change. No fracture or spondylolisthesis. Calcification is noted in each carotid artery.  There are bilateral pleural effusions. Correlation with chest radiograph may be advisable in this regard.   Electronically Signed   By: Bretta Bang M.D.   On: 06/12/2014 13:29   Ct Hip Left Wo Contrast  06/12/2014   CLINICAL DATA:  Left hip pain after tripping and falling  EXAM: CT OF THE LEFT HIP WITHOUT CONTRAST  TECHNIQUE: Multidetector CT imaging of the left hip was performed according to the standard protocol. Multiplanar CT image reconstructions were also generated.  COMPARISON:  Pelvis and left hip radiographs June 12, 2014  FINDINGS: There is no demonstrable fracture or dislocation. Bones are osteoporotic. There is slight narrowing of the left hip joint. No erosive change. There is no appreciable hip joint effusion. The surrounding muscular structures appear normal. There is calcification in the wall of the common femoral artery on the left. No soft tissue lesion is identified in the visualized pelvis.  IMPRESSION: No fracture or dislocation. Bones osteoporotic. Mild  narrowing left hip joint.   Electronically Signed   By: Bretta Bang M.D.   On: 06/12/2014 18:01    EKG: Independently reviewed.  Assessment/Plan Principal Problem:   Acute pain of left hip Active Problems:   CKD stage 3, GFR 30-59 ml/min   Chronic systolic CHF (congestive heart failure)   Fall   Diplopia   1. Diplopia - new onset after fall per patient 1. Concern for possible recent infarct in left temporal lobe on CT scan 2. Not candidate for MRI -> has AICD in place 3. 2d echo and carotid dopplers ordered 4. Known h/o A.Fib on amiodarone, not candidate for anticoagulation due to falls (like today) 5. Will talk with neurology. 2. Fall with acute pain of left hip - no evidence of fracture despite CT scan of hip, patient still unable to ambulate 1. PT/OT eval and treat 2. SW consult for NH placement (patient now willing to go to NH, she wasn't last admit earlier this month although it was recommended it appears). 3. Discussed with patient and family that this OBS admit may or may not end up with medicare paying for nursing home. 3. CKD stage 3 - Creatinine is slightly elevated from baseline, 1.9 today is 1.5 or so at baseline. 1. Hold lasix for tomorrow 2. Repeat BMP in AM 4. Chronic systolic CHF 5. DVT ppx - SCDs as patient has hypersensitivity to heparin.    Code Status: DNR  Family Communication: Daughters at bedside Disposition Plan: Admit to inpatient  Time spent: 70 min  Mikaili Flippin M. [facility] Pager [phone]  If 7AM-7PM, please contact the day team taking care of the patient [email or web address] Password [email or web address] 06/12/2014, 8:40 PM

## 2014-06-12 NOTE — ED Notes (Signed)
Per EMS pt from home, lives alone. Pt uses walker to ambulate, at 4 AM pt had to go to restroom and tripped on walker per patient. Pt. States unknown LOC. Pt was laying on floor til 9am when family came by.   Good CNS, able to follow commands. Pt endorses neck, lower back and left shoulder and left hip pain. Pupils equal and reactive to light. Laceration to posterior scalp.

## 2014-06-12 NOTE — ED Provider Notes (Signed)
CSN: [id number]     Arrival date & time 06/12/14  1124 History   First MD Initiated Contact with Patient 06/12/14 1124     Chief Complaint  Patient presents with  . Fall     (Consider location/radiation/quality/duration/timing/severity/associated sxs/prior Treatment) HPI Pt with hx of mulitple medical problems who lives at home alone presents after fall.  She states she got up this morning to use the bathroom.  Was walking with her walker and tripped on a side table causing her to fall backwards.  She hit her head on the floor.  She denies LOC.  No lightheadedness or dizzinesss or chest pain or palpitations prior to the fall.  She was unable to get up from floor and laid on floor approx 6 hours until family members couldn't reach her and EMS was called.  Pt c/o chronic left shoulder pain, new right shoulder pain, left hip pain, low back pain and neck pain.  Bleeding noted at posterior scalp where she struck her head.  She does not take blood thinners.  Pain is constant and worse with movement and palpation.  There are no other associated systemic symptoms, there are no other alleviating or modifying factors.   Past Medical History  Diagnosis Date  . Diabetes mellitus without complication   . DJD (degenerative joint disease)   . Cardiomegaly   . Coronary artery disease     a. LAD stent 1999 after NSTEMI. b. CABG 2004. c. Last LHC 02/2010 - patent grafts.  . Hypertension   . Hyperlipidemia   . Carotid artery disease 05/2003    a. s/p right carotid endarterectomy 05/2003  . Degenerative joint disease   . Restless leg syndrome   . CKD (chronic kidney disease), stage III   . Heparin induced thrombocytopenia   . Diphtheria     "as a child"  . Pleural effusion, bilateral 06/2003  . Pseudoaneurysm 01/1998    a. s/p repair following catherization in 01/1998  . Chronic systolic heart failure 10/02/11  . Ischemic cardiomyopathy     Echo 10/01/11: Mild LVH, EF 35-40%, basal inferior, basal to mid  posterior and basal to mid anterolateral severe hypokinesis, mild to moderate AI, moderate MR (ischemic MR), moderate LAE, mild RVE, mildly reduced RV systolic function, mild RAE, PASP 43-44  . COPD (chronic obstructive pulmonary disease)   . Atrial fibrillation/flutter     s/p multiple DCCVs;  amiodarone started 09/2011, TEE DCCV 3/13;  amio and coumadin d/c'd after adm[facility]n to [facility] 02/2012 with frequent falls/syncope  . Tachycardia-bradycardia syndrome     a. s/p [employer] PPM 09/2011: SN: [id number] (8 sec pause noted)  . GERD (gastroesophageal reflux disease)   . Renal artery stenosis     bilateral- status post stenting  . Non-functioning kidney   . Depression     "cries alot"  . Gout   . Hypothyroidism   . Anemia    Past Surgical History  Procedure Laterality Date  . Carotid endarterectomy  05/2003    right  . Pacemaker insertion  10/02/11    implanted by Dr Johney Frame  . Cholecystectomy  ?02/2011  . Tonsillectomy and adenoidectomy    . Renal artery stent  06/2003    bilaterally/e-chart  . Thoracentesis  ? 2000; 05/2011  . Coronary angioplasty with stent placement  1999  . Av fistula repair  01/1998    w/pseudoaneurysm repair S/P catheterization/E-chart  . Coronary artery bypass graft  05/2003    CABG X 3  . Tee without  cardioversion  10/23/2011    Procedure: TRANSESOPHAGEAL ECHOCARDIOGRAM (TEE);  Surgeon: Wendall Stade, MD;  Location: [facility] ENDOSCOPY;  Service: Cardiovascular;  Laterality: N/A;  . Cardioversion  10/23/2011    Procedure: CARDIOVERSION;  Surgeon: Wendall Stade, MD;  Location: [facility] ENDOSCOPY;  Service: Cardiovascular;  Laterality: N/A;   Family History  Problem Relation Age of Onset  . Cancer Mother 73    died  . Lung disease Father 22    died  . Heart disease Sister     2 sisters and her son  . Anesthesia problems Neg Hx    History  Substance Use Topics  . Smoking status: Never Smoker   . Smokeless tobacco: Never Used  . Alcohol Use: No   OB History   Grav Para  Term Preterm Abortions TAB SAB Ect Mult Living                 Review of Systems ROS reviewed and all otherwise negative except for mentioned in HPI    Allergies  Heparin; Hydromorphone hcl; Warfarin and related; Altace; Contrast media; Morphine and related; and Plavix  Home Medications   Prior to Admission medications   Medication Sig Start Date End Date Taking? Authorizing Provider  amiodarone (PACERONE) 200 MG tablet Take 200 mg by mouth daily.     Historical Provider, MD  aspirin EC 81 MG tablet Take 81 mg by mouth daily.    Historical Provider, MD  benzonatate (TESSALON PERLES) 100 MG capsule Take 1 capsule (100 mg total) by mouth 3 (three) times daily as needed for cough. 05/23/14   Ripudeep Jenna Luo, MD  carvedilol (COREG) 12.5 MG tablet Take 1 tablet (12.5 mg total) by mouth 2 (two) times daily with a meal. 03/10/14   Rollene Rotunda, MD  cyanocobalamin (,VITAMIN B-12,) 1000 MCG/ML injection Inject 1,000 mcg into the muscle every 30 (thirty) days. Done at Dr. Joyce Copa office    Historical Provider, MD  docusate sodium (COLACE) 100 MG capsule Take 100 mg by mouth daily.     Historical Provider, MD  furosemide (LASIX) 40 MG tablet Take 40 mg by mouth daily.  02/06/14   Esperanza Sheets, MD  guaiFENesin (MUCINEX) 600 MG 12 hr tablet Take 1 tablet (600 mg total) by mouth 2 (two) times daily. 05/23/14   Ripudeep Jenna Luo, MD  HYDROcodone-acetaminophen (NORCO) 10-325 MG per tablet Take 1 tablet by mouth every 6 (six) hours. scheduled 02/08/14   Esperanza Sheets, MD  isosorbide mononitrate (IMDUR) 15 mg TB24 24 hr tablet Take 0.5 tablets (15 mg total) by mouth daily. 02/06/14   Esperanza Sheets, MD  lactose free nutrition (BOOST) LIQD Take 237 mLs by mouth 3 (three) times daily between meals.    Historical Provider, MD  levofloxacin (LEVAQUIN) 500 MG tablet Take 1 tablet (500 mg total) by mouth every other day. For 3 more doses 05/25/14   Ripudeep K Rai, MD  levothyroxine (SYNTHROID, LEVOTHROID) 88  MCG tablet Take 88 mcg by mouth daily before breakfast.    Historical Provider, MD  LORazepam (ATIVAN) 0.5 MG tablet Take 0.5 mg by mouth daily.    Historical Provider, MD  nitroGLYCERIN (NITROSTAT) 0.4 MG SL tablet Place 0.4 mg under the tongue every 5 (five) minutes as needed for chest pain.    Historical Provider, MD  omeprazole (PRILOSEC) 40 MG capsule Take 1 capsule (40 mg total) by mouth daily. 11/27/12   Rhonda G Barrett, PA-C  potassium chloride (K-DUR,KLOR-CON) 10 MEQ tablet  Take 10 mEq by mouth daily.    Historical Provider, MD  Tamsulosin HCl (FLOMAX) 0.4 MG CAPS Take 1 capsule (0.4 mg total) by mouth daily. 08/29/12   Dayna N Dunn, PA-C   BP 144/58  Pulse 70  Temp(Src) 96.7 F (35.9 C) (Rectal)  Resp 22  Ht 5\' 4"  (1.626 m)  Wt 110 lb (49.896 kg)  BMI 18.87 kg/m2  SpO2 97% Vitals reviewed Physical Exam Physical Examination: General appearance - alert, well appearing, and in no distress Mental status - alert, oriented to person, place, and time Eyes - pupils equal and reactive, extraocular eye movements intact Head- small scalp laceration and small hematoma overlying occipital region Mouth - mucous membranes moist, pharynx normal without lesions Neck - c-collar in place, mild midline cervical spine tenderness Chest - clear to auscultation, no wheezes, rales or rhonchi, symmetric air entry, nontender, normal respiratory effort Heart - normal rate, regular rhythm, normal S1, S2, no murmurs, rubs, clicks or gallops Abdomen - soft, nontender, nondistended, no masses or organomegaly Back exam - lumbar tenderness to midline palpation, no thoracic tenderness to palpation, some erythema of skin overlying lower lumbar region- no skin breakdown Neurological - alert, oriented x 3, speech normal, no cranial nerve defects, strength 5/5 in extremities x 4, sensation intact Musculoskeletal - ttp over anterior right shoulder and pain with ROM, left shoulder with no change in chronic pain with  ROM, left hip with ttp over lateral aspect and pain with ROM, otherwise no joint tenderness, deformity or swelling Extremities - peripheral pulses normal, no pedal edema, no clubbing or cyanosis Skin - normal coloration and turgor, no rashes, erythema as noted above, scattered contusions in various stages of healing  ED Course  LACERATION REPAIR Date/Time: 06/12/2014 3:35 PM Performed by: Ethelda Chick Authorized by: Ethelda Chick Consent: Verbal consent obtained. Risks and benefits: risks, benefits and alternatives were discussed Consent given by: patient Patient understanding: patient states understanding of the procedure being performed Patient identity confirmed: verbally with patient and arm band Time out: Immediately prior to procedure a "time out" was called to verify the correct patient, procedure, equipment, support staff and site/side marked as required. Body area: head/neck Location details: scalp Laceration length: 1 cm Foreign bodies: no foreign bodies Tendon involvement: none Nerve involvement: none Vascular damage: no Patient sedated: no Irrigation solution: saline Irrigation method: syringe Amount of cleaning: extensive Debridement: none Degree of undermining: none Skin closure: staples Number of sutures: 1 Approximation: close Approximation difficulty: simple Patient tolerance: Patient tolerated the procedure well with no immediate complications.   (including critical care time)  4:17 PM temperature improving under warm blankets.  Pt feels tired and continues to have pain.  Scalp laceration repaired.  All films and CT scans negative.  Will attempt to ambulate patient.    4:33 PM pt not able to ambulate due to left hip pain.  CT hip ordered.  Will give another dose of fentanyl.  Labs Review Labs Reviewed  CBC - Abnormal; Notable for the following:    Hemoglobin 11.5 (*)    HCT 35.6 (*)    RDW 18.9 (*)    All other components within normal limits   BASIC METABOLIC PANEL - Abnormal; Notable for the following:    Sodium 132 (*)    Chloride 94 (*)    Glucose, Bld 117 (*)    BUN 42 (*)    Creatinine, Ser 1.93 (*)    GFR calc non Af Amer 22 (*)  GFR calc Af Amer 26 (*)    Anion gap 19 (*)    All other components within normal limits  BASIC METABOLIC PANEL - Abnormal; Notable for the following:    Sodium 135 (*)    CO2 18 (*)    Glucose, Bld 107 (*)    BUN 43 (*)    Creatinine, Ser 1.91 (*)    GFR calc non Af Amer 23 (*)    GFR calc Af Amer 26 (*)    Anion gap 20 (*)    All other components within normal limits  URINALYSIS, ROUTINE W REFLEX MICROSCOPIC    Imaging Review Dg Chest 2 View  06/12/2014   CLINICAL DATA:  Fall 1 day prior. History of atrial fibrillation. Hypertension.  EXAM: CHEST  2 VIEW  COMPARISON:  May 20, 2014  FINDINGS: There is scarring in the right mid lung and left base regions. There is no frank edema or consolidation. The heart is slightly enlarged with pulmonary vascularity within normal limits. Pacemaker leads are attached to the right atrium and right ventricle. Patient is status post coronary artery bypass grafting. No adenopathy. No pneumothorax. There is arthropathy in both shoulders.  IMPRESSION: Areas of lung scarring bilaterally. No edema or consolidation. Heart prominent but stable.   Electronically Signed   By: Bretta Bang M.D.   On: 06/12/2014 15:00   Dg Lumbar Spine Complete  06/12/2014   CLINICAL DATA:  Low back pain following fall earlier in the day  EXAM: LUMBAR SPINE - COMPLETE 4+ VIEW  COMPARISON:  February 19, 2012  FINDINGS: Frontal, lateral, spot lumbosacral lateral, and bilateral oblique views were obtained. There are 5 non-rib-bearing lumbar type vertebral bodies. There is no fracture or spondylolisthesis. There is moderately severe disc space narrowing at L4-5 and L5-S1. There is mild disc space narrowing at T12-L1 and L1-2. There is facet osteoarthritic change at all levels  bilaterally. There is atherosclerotic change in the aorta as well as in the major mesenteric arteries. There are stents in each renal artery. Bones are osteoporotic.  IMPRESSION: Bones are osteoporotic. There is multilevel osteoarthritic change. No fracture or spondylolisthesis. Extensive atherosclerotic calcification.   Electronically Signed   By: Bretta Bang M.D.   On: 06/12/2014 13:04   Dg Shoulder Right  06/12/2014   CLINICAL DATA:  Fall with right shoulder pain.  EXAM: RIGHT SHOULDER - 2+ VIEW  COMPARISON:  None.  FINDINGS: No acute fracture or dislocation is identified. There are advanced changes of osteoarthritis involving the glenohumeral joint. Milder degenerative changes are seen involving the [facility] joint. The [facility] joint itself shows no obvious sprain or separation. There is suggestion of soft tissue swelling overlying the distal clavicle and acromion. Sclerotic bone island present in the posterior humeral head.  IMPRESSION: No acute fracture. Significant degenerative osteoarthritis of the glenohumeral joint. Soft tissue swelling overlying the AC joint may relate to focal soft tissue injury.   Electronically Signed   By: Irish Lack M.D.   On: 06/12/2014 13:01   Dg Hip Complete Left  06/12/2014   CLINICAL DATA:  Fall this morning.  EXAM: LEFT HIP - COMPLETE 2+ VIEW  COMPARISON:  None.  FINDINGS: Osteopenia. No acute fracture. No dislocation. Degenerative changes in the lumbar spine.  IMPRESSION: No acute bony pathology.   Electronically Signed   By: Maryclare Bean M.D.   On: 06/12/2014 13:01   Ct Head Wo Contrast  06/12/2014   CLINICAL DATA:  Patient fell and found on floor. Complaining  of headache and neck pain  EXAM: CT HEAD WITHOUT CONTRAST  CT CERVICAL SPINE WITHOUT CONTRAST  TECHNIQUE: Multidetector CT imaging of the head and cervical spine was performed following the standard protocol without intravenous contrast. Multiplanar CT image reconstructions of the cervical spine were also  generated.  COMPARISON:  Head CT February 04, 2014  FINDINGS: CT HEAD FINDINGS  There is moderate diffuse atrophy. There is no intracranial mass, hemorrhage, extra-axial fluid collection, or midline shift. There is small vessel disease in the centra semiovale bilaterally, more on the left than on the right, stable. There is evidence of prior small vessel disease in the anterior limb of the left external capsule, stable. To a lesser degree there is small vessel disease in the anterior limb of the left internal capsule, stable.  Compared to the prior study, there is decreased attenuation in the mid and posterior superior left temporal lobe extending to the left temple-occipital junction. A recent infarct in this area is of concern. This appearance is best seen on axial slice 14 series 201. No other findings concerning for potential acute infarct identified.  There is a small right occipital scalp hematoma with acute hemorrhage in this area. The bony calvarium appears intact. The mastoid air cells are clear. There is bilateral ethmoid sinus disease.  CT CERVICAL SPINE FINDINGS  There is no fracture or spondylolisthesis. Prevertebral soft tissues and predental space regions are normal.  There is severe disc space narrowing at C5-6. There is moderately severe disc space narrowing at C6-7. There is facet hypertrophy at multiple levels bilaterally. There is no frank disc extrusion or stenosis. Note that there is fairly extensive bony overgrowth at C1 on the left.  There is marked osteoarthritic change in both temporomandibular joints. There is calcification in each carotid artery.  There are bilateral pleural effusions.  IMPRESSION: CT head: Atrophy with supratentorial small vessel disease. Question recent infarct in the left superior mid to posterior temporal lobe. No intracranial hemorrhage or mass effect. There is a small right occipital scalp hematoma. There is ethmoid sinus disease bilaterally.  CT cervical spine:  Multilevel osteoarthritic change. No fracture or spondylolisthesis. Calcification is noted in each carotid artery.  There are bilateral pleural effusions. Correlation with chest radiograph may be advisable in this regard.   Electronically Signed   By: Bretta Bang M.D.   On: 06/12/2014 13:29   Ct Cervical Spine Wo Contrast  06/12/2014   CLINICAL DATA:  Patient fell and found on floor. Complaining of headache and neck pain  EXAM: CT HEAD WITHOUT CONTRAST  CT CERVICAL SPINE WITHOUT CONTRAST  TECHNIQUE: Multidetector CT imaging of the head and cervical spine was performed following the standard protocol without intravenous contrast. Multiplanar CT image reconstructions of the cervical spine were also generated.  COMPARISON:  Head CT February 04, 2014  FINDINGS: CT HEAD FINDINGS  There is moderate diffuse atrophy. There is no intracranial mass, hemorrhage, extra-axial fluid collection, or midline shift. There is small vessel disease in the centra semiovale bilaterally, more on the left than on the right, stable. There is evidence of prior small vessel disease in the anterior limb of the left external capsule, stable. To a lesser degree there is small vessel disease in the anterior limb of the left internal capsule, stable.  Compared to the prior study, there is decreased attenuation in the mid and posterior superior left temporal lobe extending to the left temple-occipital junction. A recent infarct in this area is of concern. This appearance is best  seen on axial slice 14 series 201. No other findings concerning for potential acute infarct identified.  There is a small right occipital scalp hematoma with acute hemorrhage in this area. The bony calvarium appears intact. The mastoid air cells are clear. There is bilateral ethmoid sinus disease.  CT CERVICAL SPINE FINDINGS  There is no fracture or spondylolisthesis. Prevertebral soft tissues and predental space regions are normal.  There is severe disc space  narrowing at C5-6. There is moderately severe disc space narrowing at C6-7. There is facet hypertrophy at multiple levels bilaterally. There is no frank disc extrusion or stenosis. Note that there is fairly extensive bony overgrowth at C1 on the left.  There is marked osteoarthritic change in both temporomandibular joints. There is calcification in each carotid artery.  There are bilateral pleural effusions.  IMPRESSION: CT head: Atrophy with supratentorial small vessel disease. Question recent infarct in the left superior mid to posterior temporal lobe. No intracranial hemorrhage or mass effect. There is a small right occipital scalp hematoma. There is ethmoid sinus disease bilaterally.  CT cervical spine: Multilevel osteoarthritic change. No fracture or spondylolisthesis. Calcification is noted in each carotid artery.  There are bilateral pleural effusions. Correlation with chest radiograph may be advisable in this regard.   Electronically Signed   By: Bretta BangWilliam  Woodruff M.D.   On: 06/12/2014 13:29     EKG Interpretation   Date/Time:  Saturday June 12 2014 11:46:25 EDT Ventricular Rate:  82 PR Interval:  160 QRS Duration: 213 QT Interval:  514 QTC Calculation: 600 R Axis:   -84 Text Interpretation:  Sinus rhythm LAE, consider biatrial enlargement LVH  with IVCD, LAD and secondary repol abnrm Prolonged QT interval Since  previous tracing pacer spikes less evident Confirmed by Karma GanjaLINKER  MD, Kahlen Boyde  [phone]([id number]) on 06/12/2014 4:04:39 PM      MDM   Final diagnoses:  Fall  Head injury, initial encounter  Scalp laceration, initial encounter  Hip pain, acute, left  Shoulder pain, acute, right  Hypothermia, initial encounter    Pt presenting after fall, was down for 6 hours, on initial exam hypothermic which improved under bair hugger and warm blankets.  CT head and cspine reassuring, xrays reassuring with degenerative changes.  Pt with mild increase in her baseline renal insufficiency.  Pt  treated with pain meds, IV hydration.  Scalp laceration repaired.  She is drinking liquids in the ED.  She uses home oxygen and is stable on 2Liters here.  CT hip ordered as patient not able to ambulate due to hip pain. Family at bedside updated about findings and plan.   Pt signed out to Dr. Micheline Mazeocherty pending ct hip and reassessment.      Ethelda ChickMartha K Linker, MD 06/12/14 [id number]

## 2014-06-12 NOTE — ED Notes (Signed)
Pt transported to xray 

## 2014-06-12 NOTE — ED Notes (Signed)
MD at bedside  Told RN to transport Pt to med surg although he is rereading CT scan to rule out infarct in brain.

## 2014-06-12 NOTE — ED Notes (Signed)
Pt. At radiology

## 2014-06-12 NOTE — ED Notes (Signed)
Pt refuses to ambulate at this time due to "feeling like a bone is sticking out."

## 2014-06-12 NOTE — ED Notes (Signed)
Patient assisted to sitting on side of bed. After aprox 1 min. Helped patient to standing beside of bed, she C/O pain 10/10 in left hip area. Patient returned  to monitor. Informed Dr. Karma Ganja of patient pain in left hip when standing.

## 2014-06-13 DIAGNOSIS — H532 Diplopia: Secondary | ICD-10-CM

## 2014-06-13 DIAGNOSIS — I369 Nonrheumatic tricuspid valve disorder, unspecified: Secondary | ICD-10-CM

## 2014-06-13 LAB — BASIC METABOLIC PANEL
Anion gap: 16 — ABNORMAL HIGH (ref 5–15)
BUN: 45 mg/dL — ABNORMAL HIGH (ref 6–23)
CO2: 19 meq/L (ref 19–32)
CREATININE: 2.01 mg/dL — AB (ref 0.50–1.10)
Calcium: 8.4 mg/dL (ref 8.4–10.5)
Chloride: 98 mEq/L (ref 96–112)
GFR calc Af Amer: 25 mL/min — ABNORMAL LOW (ref >90)
GFR calc non Af Amer: 21 mL/min — ABNORMAL LOW (ref >90)
GLUCOSE: 103 mg/dL — AB (ref 70–99)
Potassium: 4.5 mEq/L (ref 3.7–5.3)
SODIUM: 133 meq/L — AB (ref 137–147)

## 2014-06-13 LAB — URINALYSIS, ROUTINE W REFLEX MICROSCOPIC
GLUCOSE, UA: NEGATIVE mg/dL
Hgb urine dipstick: NEGATIVE
Ketones, ur: 15 mg/dL — AB
Nitrite: NEGATIVE
PH: 5 (ref 5.0–8.0)
PROTEIN: NEGATIVE mg/dL
Specific Gravity, Urine: 1.019 (ref 1.005–1.030)
Urobilinogen, UA: 0.2 mg/dL (ref 0.0–1.0)

## 2014-06-13 LAB — URINE MICROSCOPIC-ADD ON

## 2014-06-13 LAB — GLUCOSE, CAPILLARY: GLUCOSE-CAPILLARY: 152 mg/dL — AB (ref 70–99)

## 2014-06-13 LAB — CREATININE, URINE, RANDOM: CREATININE, URINE: 111.75 mg/dL

## 2014-06-13 LAB — SODIUM, URINE, RANDOM: Sodium, Ur: <20 mEq/L

## 2014-06-13 LAB — OSMOLALITY: OSMOLALITY: 290 mosm/kg (ref 275–300)

## 2014-06-13 MED ORDER — SODIUM CHLORIDE 0.9 % IV SOLN
INTRAVENOUS | Status: AC
  Administered 2014-06-13: 12:00:00 via INTRAVENOUS

## 2014-06-13 MED ORDER — ACETAMINOPHEN 325 MG PO TABS
325.0000 mg | ORAL_TABLET | Freq: Four times a day (QID) | ORAL | Status: DC | PRN
  Administered 2014-06-13 – 2014-06-15 (×2): 325 mg via ORAL
  Filled 2014-06-13 (×3): qty 1

## 2014-06-13 NOTE — Progress Notes (Signed)
Utilization review completed.  

## 2014-06-13 NOTE — Progress Notes (Signed)
Nurse covered bony prominences; sacral and mid back with foam dressing for protection.

## 2014-06-13 NOTE — Evaluation (Signed)
Physical Therapy Evaluation Patient Details Name: [name] [name] MRN: [id number] DOB: Jan 29, 1925 Today's Date: 06/13/2014   History of Present Illness  [name] [name] is an 80 y.o. Female admitted 06/12/14 following a fall  at home. Pt c/o Lt hip pain but negative for fx. Pt has also c/o double vision, but likely related to [facility]. CT pending for stroke workup. PMH of Dm, CAD, HTN, CAD, CKD stage III, COPD with recent hospitalizations for pneumonia and sepsis.  Clinical Impression   Pt admitted with above. Pt currently with functional limitations due to the deficits listed below (see PT Problem List).  Pt will benefit from skilled PT to increase their independence and safety with mobility to allow discharge to the venue listed below.       Follow Up Recommendations SNF    Equipment Recommendations  None recommended by PT    Recommendations for Other Services       Precautions / Restrictions Precautions Precautions: Fall Precaution Comments: watch BP Restrictions Weight Bearing Restrictions: No      Mobility  Bed Mobility Overal bed mobility: Needs Assistance Bed Mobility: Sit to Supine       Sit to supine: Mod assist   General bed mobility comments: Mod assist to help LEs into bed and to gently lay down on R side  Transfers Overall transfer level: Needs assistance Equipment used: Rolling walker (2 wheeled) Transfers: Sit to/from Stand Sit to Stand: Mod assist Stand pivot transfers: Mod assist       General transfer comment: Min (A) to power up from chair with assist provided on Right side due to LUE pain. Pt required increased assistance to manage walker and to maintain balance while taking pivotal steps. Pt became fatigued quickly    Ambulation/Gait Ambulation/Gait assistance: Mod assist Ambulation Distance (Feet): 3 Feet (pivot steps chair to bed) Assistive device: Rolling walker (2 wheeled) Gait Pattern/deviations: Shuffle Gait velocity: decreased    General Gait Details: Cues for posture and hand placement; also directional cueing  Stairs            Wheelchair Mobility    Modified Rankin (Stroke Patients Only)       Balance     Sitting balance-Leahy Scale: Poor       Standing balance-Leahy Scale: Poor                               Pertinent Vitals/Pain Pain Assessment: Faces Faces Pain Scale: Hurts even more Pain Location: L side of body Pain Descriptors / Indicators: Constant Pain Intervention(s): Limited activity within patient's tolerance;Monitored during session;Repositioned    Home Living Family/patient expects to be discharged to:: Skilled nursing facility                 Additional Comments: Family and pt in agreement regarding SNF at d/c due to deconditioning from recent health history.     Prior Function Level of Independence: Needs assistance   Gait / Transfers Assistance Needed: Pt ambulates with RW and reports that she fell after tripped on her walker at home  ADL's / Homemaking Assistance Needed: Pt reports that her daughter assists with bathing and dressing and family provides her with meals.         Hand Dominance   Dominant Hand: Right    Extremity/Trunk Assessment   Upper Extremity Assessment: Defer to OT evaluation       LUE Deficits / Details: Pt reports rotator cuff  injury and has limited ROM with left arm. Pt reports it is painful to use and has difficulty WB through LUE when using walker.    Lower Extremity Assessment: Generalized weakness      Cervical / Trunk Assessment: Kyphotic (forward head)  Communication   Communication: No difficulties  Cognition Arousal/Alertness: Awake/alert Behavior During Therapy: WFL for tasks assessed/performed Overall Cognitive Status: Within Functional Limits for tasks assessed                      General Comments      Exercises        Assessment/Plan    PT Assessment Patient needs continued PT  services  PT Diagnosis Generalized weakness;Acute pain   PT Problem List Decreased activity tolerance;Decreased strength;Decreased mobility;Decreased balance;Decreased safety awareness;Cardiopulmonary status limiting activity  PT Treatment Interventions DME instruction;Gait training;Functional mobility training;Therapeutic activities;Therapeutic exercise;Balance training;Patient/family education   PT Goals (Current goals can be found in the Care Plan section) Acute Rehab PT Goals Patient Stated Goal: to get my strength back PT Goal Formulation: With patient Time For Goal Achievement: 06/27/14 Potential to Achieve Goals: Fair    Frequency Min 3X/week   Barriers to discharge        Co-evaluation               End of Session Equipment Utilized During Treatment: Gait belt Activity Tolerance: Patient limited by pain Patient left: in chair;with call bell/phone within reach Nurse Communication: Mobility status         Time: [id number] PT Time Calculation (min): 13 min   Charges:   PT Evaluation $Initial PT Evaluation Tier I: 1 Procedure PT Treatments $Gait Training: 8-22 mins   PT G Codes:          Olen PelGarrigan, [name] [name] 06/13/2014, 5:16 PM  [name] [name], [place]  Acute Rehabilitation Services Pager [phone] Office [phone]

## 2014-06-13 NOTE — Consult Note (Signed)
Referring Physician: Julian Reil    Chief Complaint: Diplopia  HPI: [name] [name] is an 80 y.o. female who suffered a mechanical fall at home. She landed on her left side. She was down for about 6 hours before family members found her and called EMS. She hit her head on floor but denies LOC. She now complains of diplopia.  She reports that it is intermittent but was not present prior to falling.    Date last known well: Date: 06/12/2014 Time last known well: Time: 04:00 tPA Given: No: Outside time window  Past Medical History  Diagnosis Date  . Diabetes mellitus without complication   . DJD (degenerative joint disease)   . Cardiomegaly   . Coronary artery disease     a. LAD stent 1999 after NSTEMI. b. CABG 2004. c. Last LHC 02/2010 - patent grafts.  . Hypertension   . Hyperlipidemia   . Carotid artery disease 05/2003    a. s/p right carotid endarterectomy 05/2003  . Degenerative joint disease   . Restless leg syndrome   . CKD (chronic kidney disease), stage III   . Heparin induced thrombocytopenia   . Diphtheria     "as a child"  . Pleural effusion, bilateral 06/2003  . Pseudoaneurysm 01/1998    a. s/p repair following catherization in 01/1998  . Chronic systolic heart failure 10/02/11  . Ischemic cardiomyopathy     Echo 10/01/11: Mild LVH, EF 35-40%, basal inferior, basal to mid posterior and basal to mid anterolateral severe hypokinesis, mild to moderate AI, moderate MR (ischemic MR), moderate LAE, mild RVE, mildly reduced RV systolic function, mild RAE, PASP 43-44  . COPD (chronic obstructive pulmonary disease)   . Atrial fibrillation/flutter     s/p multiple DCCVs;  amiodarone started 09/2011, TEE DCCV 3/13;  amio and coumadin d/c'd after admxn to [facility] 02/2012 with frequent falls/syncope  . Tachycardia-bradycardia syndrome     a. s/p [employer] PPM 09/2011: SN: [id number] H (8 sec pause noted)  . GERD (gastroesophageal reflux disease)   . Renal artery stenosis     bilateral- status  post stenting  . Non-functioning kidney   . Depression     "cries alot"  . Gout   . Hypothyroidism   . Anemia     Past Surgical History  Procedure Laterality Date  . Carotid endarterectomy  05/2003    right  . Pacemaker insertion  10/02/11    implanted by Dr Johney Frame  . Cholecystectomy  ?02/2011  . Tonsillectomy and adenoidectomy    . Renal artery stent  06/2003    bilaterally/e-chart  . Thoracentesis  ? 2000; 05/2011  . Coronary angioplasty with stent placement  1999  . Av fistula repair  01/1998    w/pseudoaneurysm repair S/P catheterization/E-chart  . Coronary artery bypass graft  05/2003    CABG X 3  . Tee without cardioversion  10/23/2011    Procedure: TRANSESOPHAGEAL ECHOCARDIOGRAM (TEE);  Surgeon: Wendall Stade, MD;  Location: [facility] ENDOSCOPY;  Service: Cardiovascular;  Laterality: N/A;  . Cardioversion  10/23/2011    Procedure: CARDIOVERSION;  Surgeon: Wendall Stade, MD;  Location: [facility] ENDOSCOPY;  Service: Cardiovascular;  Laterality: N/A;    Family History  Problem Relation Age of Onset  . Cancer Mother 1    died  . Lung disease Father 51    died  . Heart disease Sister     2 sisters and her son  . Anesthesia problems Neg Hx    Social History:  reports that  she has never smoked. She has never used smokeless tobacco. She reports that she does not drink alcohol or use illicit drugs.  Allergies:  Allergies  Allergen Reactions  . Heparin Other (See Comments)    Clots blood instead of thinning  . Hydromorphone Hcl Other (See Comments)    Crazy feeling  . Warfarin And Related Other (See Comments)    Syncope events  . Altace [Ramipril] Hives  . Contrast Media [Iodinated Diagnostic Agents] Other (See Comments)    Reaction noted by md- might have caused aneurism in 1999  . Morphine And Related Nausea And Vomiting  . Plavix [Clopidogrel Bisulfate] Hives and Itching    Medications:  I have reviewed the patient's current medications. Prior to Admission:   Prescriptions prior to admission  Medication Sig Dispense Refill Last Dose  . amiodarone (PACERONE) 200 MG tablet Take 200 mg by mouth daily.    06/11/2014 at Unknown time  . aspirin EC 81 MG tablet Take 81 mg by mouth daily.   06/11/2014 at Unknown time  . benzonatate (TESSALON PERLES) 100 MG capsule Take 1 capsule (100 mg total) by mouth 3 (three) times daily as needed for cough. 30 capsule 0 06/11/2014 at Unknown time  . carvedilol (COREG) 12.5 MG tablet Take 1 tablet (12.5 mg total) by mouth 2 (two) times daily with a meal. 60 tablet 6 06/11/2014 at 8a  . cyanocobalamin (,VITAMIN B-12,) 1000 MCG/ML injection Inject 1,000 mcg into the muscle every 30 (thirty) days. Done at Dr. Joyce Copa office   Past Week at Unknown time  . docusate sodium (COLACE) 100 MG capsule Take 100 mg by mouth daily.    06/11/2014 at Unknown time  . furosemide (LASIX) 40 MG tablet Take 40 mg by mouth daily.    06/11/2014 at Unknown time  . guaiFENesin (MUCINEX) 600 MG 12 hr tablet Take 1 tablet (600 mg total) by mouth 2 (two) times daily. 30 tablet 0 06/11/2014 at Unknown time  . HYDROcodone-acetaminophen (NORCO) 10-325 MG per tablet Take 1 tablet by mouth every 6 (six) hours. scheduled 30 tablet 0 06/11/2014 at Unknown time  . isosorbide mononitrate (IMDUR) 15 mg TB24 24 hr tablet Take 0.5 tablets (15 mg total) by mouth daily. 30 tablet 3 06/11/2014 at Unknown time  . lactose free nutrition (BOOST) LIQD Take 237 mLs by mouth 3 (three) times daily between meals.   06/11/2014 at Unknown time  . levothyroxine (SYNTHROID, LEVOTHROID) 88 MCG tablet Take 88 mcg by mouth daily before breakfast.   06/11/2014 at Unknown time  . LORazepam (ATIVAN) 0.5 MG tablet Take 0.5 mg by mouth daily.   06/11/2014 at Unknown time  . nitroGLYCERIN (NITROSTAT) 0.4 MG SL tablet Place 0.4 mg under the tongue every 5 (five) minutes as needed for chest pain.   06/11/2014 at Unknown time  . omeprazole (PRILOSEC) 40 MG capsule Take 1 capsule (40 mg  total) by mouth daily. 30 capsule 11 06/11/2014 at Unknown time  . potassium chloride (K-DUR,KLOR-CON) 10 MEQ tablet Take 10 mEq by mouth daily.   06/11/2014 at Unknown time  . Tamsulosin HCl (FLOMAX) 0.4 MG CAPS Take 1 capsule (0.4 mg total) by mouth daily. 30 capsule 0 06/11/2014 at Unknown time   Scheduled: . amiodarone  200 mg Oral Daily  . aspirin EC  81 mg Oral Daily  . carvedilol  12.5 mg Oral BID WC  . [START ON 07/12/2014] cyanocobalamin  1,000 mcg Intramuscular Q30 days  . docusate sodium  100 mg Oral Daily  .  guaiFENesin  600 mg Oral BID  . HYDROcodone-acetaminophen  1 tablet Oral 4 times per day  . isosorbide mononitrate  15 mg Oral Daily  . lactose free nutrition  237 mL Oral TID BM  . levothyroxine  88 mcg Oral QAC breakfast  . LORazepam  0.5 mg Oral QHS  . pantoprazole  80 mg Oral Daily  . tamsulosin  0.4 mg Oral Daily    ROS: History obtained from the patient  General ROS: negative for - chills, fatigue, fever, night sweats, weight gain or weight loss Psychological ROS: negative for - behavioral disorder, hallucinations, memory difficulties, mood swings or suicidal ideation Ophthalmic ROS: negative for - blurry vision, double vision, eye pain or loss of vision ENT ROS: negative for - epistaxis, nasal discharge, oral lesions, sore throat, tinnitus or vertigo Allergy and Immunology ROS: negative for - hives or itchy/watery eyes Hematological and Lymphatic ROS: negative for - bleeding problems, bruising or swollen lymph nodes Endocrine ROS: negative for - galactorrhea, hair pattern changes, polydipsia/polyuria or temperature intolerance Respiratory ROS: negative for - cough, hemoptysis, shortness of breath or wheezing Cardiovascular ROS: right leg swelling Gastrointestinal ROS: negative for - abdominal pain, diarrhea, hematemesis, nausea/vomiting or stool incontinence Genito-Urinary ROS: negative for - dysuria, hematuria, incontinence or urinary  frequency/urgency Musculoskeletal ROS: left shoulder pain Neurological ROS: as noted in HPI Dermatological ROS: negative for rash and skin lesion changes  Physical Examination: Blood pressure 108/48, pulse 70, temperature 98.2 F (36.8 C), temperature source Oral, resp. rate 18, height 5\' 4"  (1.626 m), weight 52.935 kg (116 lb 11.2 oz), SpO2 99 %.  Neurologic Examination: Mental Status: Alert, oriented, thought content appropriate.  Speech fluent without evidence of aphasia.  Able to follow 3 step commands without difficulty. Cranial Nerves: II: Discs flat bilaterally; Visual fields grossly normal, pupils equal, round, reactive to light and accommodation III,IV, VI: ptosis not present, diplopia on left lateral gaze with decreased excursion of left eye to the left V,VII: left facial droop, facial light touch sensation normal bilaterally VIII: hearing normal bilaterally IX,X: gag reflex present XI: bilateral shoulder shrug XII: midline tongue extension Motor: Right : Upper extremity   5/5    Left:     Upper extremity   5/5 hand grip.  Decreased strength and ROM at shoulder due to rotator cuff injury  Lower extremity   5/5     Lower extremity   5/5 Tone and bulk:normal tone throughout; no atrophy noted Sensory: Pinprick and light touch intact throughout, bilaterally Deep Tendon Reflexes: 1+ and symmetric with absent AJ's bilaterally Plantars: Right: downgoing   Left: downgoing Cerebellar: Normal finger-to-nose on the right.  Unable to perform on the left due to range of motion.   Gait: Unable to test due to fracture CV: pulses palpable throughout     Laboratory Studies:  Basic Metabolic Panel:  Recent Labs Lab 06/12/14 1210 06/12/14 1317  NA 132* 135*  K 5.0 4.9  CL 94* 97  CO2 19 18*  GLUCOSE 117* 107*  BUN 42* 43*  CREATININE 1.93* 1.91*  CALCIUM 9.2 9.2    Liver Function Tests: No results for input(s): AST, ALT, ALKPHOS, BILITOT, PROT, ALBUMIN in the last 168  hours. No results for input(s): LIPASE, AMYLASE in the last 168 hours. No results for input(s): AMMONIA in the last 168 hours.  CBC:  Recent Labs Lab 06/12/14 1210  WBC 7.5  HGB 11.5*  HCT 35.6*  MCV 81.5  PLT 160    Cardiac Enzymes: No results  for input(s): CKTOTAL, CKMB, CKMBINDEX, TROPONINI in the last 168 hours.  BNP: Invalid input(s): POCBNP  CBG:  Recent Labs Lab 06/13/14 0002  GLUCAP 152*    Microbiology: Results for orders placed or performed during the hospital encounter of 05/20/14  Culture, blood (routine x 2) Call MD if unable to obtain prior to antibiotics being given     Status: None   Collection Time: 05/20/14  9:20 PM  Result Value Ref Range Status   Specimen Description BLOOD HAND RIGHT  Final   Special Requests BOTTLES DRAWN AEROBIC AND ANAEROBIC 8CC  Final   Culture  Setup Time   Final    05/21/2014 01:10 Performed at [facility]   Culture   Final    NO GROWTH 5 DAYS Performed at [facility]   Report Status 05/27/2014 FINAL  Final  Culture, blood (routine x 2) Call MD if unable to obtain prior to antibiotics being given     Status: None   Collection Time: 05/20/14  9:25 PM  Result Value Ref Range Status   Specimen Description BLOOD ARM RIGHT  Final   Special Requests BOTTLES DRAWN AEROBIC ONLY [facility]9CC  Final   Culture  Setup Time   Final    05/21/2014 01:11 Performed at [facility]   Culture   Final    NO GROWTH 5 DAYS Performed at [facility]   Report Status 05/27/2014 FINAL  Final    Coagulation Studies: No results for input(s): LABPROT, INR in the last 72 hours.  Urinalysis:  Recent Labs Lab 06/12/14 1209  COLORURINE YELLOW  LABSPEC 1.014  PHURINE 5.0  GLUCOSEU NEGATIVE  HGBUR NEGATIVE  BILIRUBINUR NEGATIVE  KETONESUR NEGATIVE  PROTEINUR NEGATIVE  UROBILINOGEN 0.2  NITRITE NEGATIVE  LEUKOCYTESUR NEGATIVE    Lipid Panel:    Component Value Date/Time   CHOL 63 04/18/2014 0344   TRIG  63 04/18/2014 0344   HDL 28* 04/18/2014 0344   CHOLHDL 2.3 04/18/2014 0344   VLDL 13 04/18/2014 0344   LDLCALC 22 04/18/2014 0344    HgbA1C:  Lab Results  Component Value Date   HGBA1C 6.5* 02/04/2014    Urine Drug Screen:     Component Value Date/Time   LABOPIA POSITIVE* 02/05/2014 2055   COCAINSCRNUR NONE DETECTED 02/05/2014 2055   LABBENZ NONE DETECTED 02/05/2014 2055   AMPHETMU NONE DETECTED 02/05/2014 2055   THCU NONE DETECTED 02/05/2014 2055   LABBARB NONE DETECTED 02/05/2014 2055    Alcohol Level: No results for input(s): ETH in the last 168 hours.  Other results: EKG: sinus rhythm at 82 bpm, biatrial enlargement.  Imaging: Dg Chest 2 View  06/12/2014   CLINICAL DATA:  Fall 1 day prior. History of atrial fibrillation. Hypertension.  EXAM: CHEST  2 VIEW  COMPARISON:  May 20, 2014  FINDINGS: There is scarring in the right mid lung and left base regions. There is no frank edema or consolidation. The heart is slightly enlarged with pulmonary vascularity within normal limits. Pacemaker leads are attached to the right atrium and right ventricle. Patient is status post coronary artery bypass grafting. No adenopathy. No pneumothorax. There is arthropathy in both shoulders.  IMPRESSION: Areas of lung scarring bilaterally. No edema or consolidation. Heart prominent but stable.   Electronically Signed   By: Bretta BangWilliam  Woodruff M.D.   On: 06/12/2014 15:00   Dg Lumbar Spine Complete  06/12/2014   CLINICAL DATA:  Low back pain following fall earlier in the day  EXAM: LUMBAR  SPINE - COMPLETE 4+ VIEW  COMPARISON:  February 19, 2012  FINDINGS: Frontal, lateral, spot lumbosacral lateral, and bilateral oblique views were obtained. There are 5 non-rib-bearing lumbar type vertebral bodies. There is no fracture or spondylolisthesis. There is moderately severe disc space narrowing at L4-5 and L5-S1. There is mild disc space narrowing at T12-L1 and L1-2. There is facet osteoarthritic change at all  levels bilaterally. There is atherosclerotic change in the aorta as well as in the major mesenteric arteries. There are stents in each renal artery. Bones are osteoporotic.  IMPRESSION: Bones are osteoporotic. There is multilevel osteoarthritic change. No fracture or spondylolisthesis. Extensive atherosclerotic calcification.   Electronically Signed   By: Bretta Bang M.D.   On: 06/12/2014 13:04   Dg Shoulder Right  06/12/2014   CLINICAL DATA:  Fall with right shoulder pain.  EXAM: RIGHT SHOULDER - 2+ VIEW  COMPARISON:  None.  FINDINGS: No acute fracture or dislocation is identified. There are advanced changes of osteoarthritis involving the glenohumeral joint. Milder degenerative changes are seen involving the [employer] joint. The [employer] joint itself shows no obvious sprain or separation. There is suggestion of soft tissue swelling overlying the distal clavicle and acromion. Sclerotic bone island present in the posterior humeral head.  IMPRESSION: No acute fracture. Significant degenerative osteoarthritis of the glenohumeral joint. Soft tissue swelling overlying the AC joint may relate to focal soft tissue injury.   Electronically Signed   By: Irish Lack M.D.   On: 06/12/2014 13:01   Dg Hip Complete Left  06/12/2014   CLINICAL DATA:  Fall this morning.  EXAM: LEFT HIP - COMPLETE 2+ VIEW  COMPARISON:  None.  FINDINGS: Osteopenia. No acute fracture. No dislocation. Degenerative changes in the lumbar spine.  IMPRESSION: No acute bony pathology.   Electronically Signed   By: Maryclare Bean M.D.   On: 06/12/2014 13:01   Ct Head Wo Contrast  06/12/2014   CLINICAL DATA:  Patient fell and found on floor. Complaining of headache and neck pain  EXAM: CT HEAD WITHOUT CONTRAST  CT CERVICAL SPINE WITHOUT CONTRAST  TECHNIQUE: Multidetector CT imaging of the head and cervical spine was performed following the standard protocol without intravenous contrast. Multiplanar CT image reconstructions of the cervical spine were also  generated.  COMPARISON:  Head CT February 04, 2014  FINDINGS: CT HEAD FINDINGS  There is moderate diffuse atrophy. There is no intracranial mass, hemorrhage, extra-axial fluid collection, or midline shift. There is small vessel disease in the centra semiovale bilaterally, more on the left than on the right, stable. There is evidence of prior small vessel disease in the anterior limb of the left external capsule, stable. To a lesser degree there is small vessel disease in the anterior limb of the left internal capsule, stable.  Compared to the prior study, there is decreased attenuation in the mid and posterior superior left temporal lobe extending to the left temple-occipital junction. A recent infarct in this area is of concern. This appearance is best seen on axial slice 14 series 201. No other findings concerning for potential acute infarct identified.  There is a small right occipital scalp hematoma with acute hemorrhage in this area. The bony calvarium appears intact. The mastoid air cells are clear. There is bilateral ethmoid sinus disease.  CT CERVICAL SPINE FINDINGS  There is no fracture or spondylolisthesis. Prevertebral soft tissues and predental space regions are normal.  There is severe disc space narrowing at C5-6. There is moderately severe disc space narrowing  at C6-7. There is facet hypertrophy at multiple levels bilaterally. There is no frank disc extrusion or stenosis. Note that there is fairly extensive bony overgrowth at C1 on the left.  There is marked osteoarthritic change in both temporomandibular joints. There is calcification in each carotid artery.  There are bilateral pleural effusions.  IMPRESSION: CT head: Atrophy with supratentorial small vessel disease. Question recent infarct in the left superior mid to posterior temporal lobe. No intracranial hemorrhage or mass effect. There is a small right occipital scalp hematoma. There is ethmoid sinus disease bilaterally.  CT cervical spine:  Multilevel osteoarthritic change. No fracture or spondylolisthesis. Calcification is noted in each carotid artery.  There are bilateral pleural effusions. Correlation with chest radiograph may be advisable in this regard.   Electronically Signed   By: Bretta Bang M.D.   On: 06/12/2014 13:29   Ct Cervical Spine Wo Contrast  06/12/2014   CLINICAL DATA:  Patient fell and found on floor. Complaining of headache and neck pain  EXAM: CT HEAD WITHOUT CONTRAST  CT CERVICAL SPINE WITHOUT CONTRAST  TECHNIQUE: Multidetector CT imaging of the head and cervical spine was performed following the standard protocol without intravenous contrast. Multiplanar CT image reconstructions of the cervical spine were also generated.  COMPARISON:  Head CT February 04, 2014  FINDINGS: CT HEAD FINDINGS  There is moderate diffuse atrophy. There is no intracranial mass, hemorrhage, extra-axial fluid collection, or midline shift. There is small vessel disease in the centra semiovale bilaterally, more on the left than on the right, stable. There is evidence of prior small vessel disease in the anterior limb of the left external capsule, stable. To a lesser degree there is small vessel disease in the anterior limb of the left internal capsule, stable.  Compared to the prior study, there is decreased attenuation in the mid and posterior superior left temporal lobe extending to the left temple-occipital junction. A recent infarct in this area is of concern. This appearance is best seen on axial slice 14 series 201. No other findings concerning for potential acute infarct identified.  There is a small right occipital scalp hematoma with acute hemorrhage in this area. The bony calvarium appears intact. The mastoid air cells are clear. There is bilateral ethmoid sinus disease.  CT CERVICAL SPINE FINDINGS  There is no fracture or spondylolisthesis. Prevertebral soft tissues and predental space regions are normal.  There is severe disc space  narrowing at C5-6. There is moderately severe disc space narrowing at C6-7. There is facet hypertrophy at multiple levels bilaterally. There is no frank disc extrusion or stenosis. Note that there is fairly extensive bony overgrowth at C1 on the left.  There is marked osteoarthritic change in both temporomandibular joints. There is calcification in each carotid artery.  There are bilateral pleural effusions.  IMPRESSION: CT head: Atrophy with supratentorial small vessel disease. Question recent infarct in the left superior mid to posterior temporal lobe. No intracranial hemorrhage or mass effect. There is a small right occipital scalp hematoma. There is ethmoid sinus disease bilaterally.  CT cervical spine: Multilevel osteoarthritic change. No fracture or spondylolisthesis. Calcification is noted in each carotid artery.  There are bilateral pleural effusions. Correlation with chest radiograph may be advisable in this regard.   Electronically Signed   By: Bretta Bang M.D.   On: 06/12/2014 13:29   Ct Hip Left Wo Contrast  06/12/2014   CLINICAL DATA:  Left hip pain after tripping and falling  EXAM: CT OF THE LEFT  HIP WITHOUT CONTRAST  TECHNIQUE: Multidetector CT imaging of the left hip was performed according to the standard protocol. Multiplanar CT image reconstructions were also generated.  COMPARISON:  Pelvis and left hip radiographs June 12, 2014  FINDINGS: There is no demonstrable fracture or dislocation. Bones are osteoporotic. There is slight narrowing of the left hip joint. No erosive change. There is no appreciable hip joint effusion. The surrounding muscular structures appear normal. There is calcification in the wall of the common femoral artery on the left. No soft tissue lesion is identified in the visualized pelvis.  IMPRESSION: No fracture or dislocation. Bones osteoporotic. Mild narrowing left hip joint.   Electronically Signed   By: Bretta Bang M.D.   On: 06/12/2014 18:01     Assessment: 80 y.o. female presenting after a fall.  Has new complaints of diplopia.  Did hit her head with the fall.  Head CT reviewed and shows a possible left temporal lobe infarct.  This finding is not consistent with the patient's complaints.  Can not rule out the possibility of diplopia being related to head injury.  MRI unable to be performed.  Further work up recommended.    Stroke Risk Factors - atrial fibrillation, diabetes mellitus, hyperlipidemia and hypertension  Plan: 1. HgbA1c, fasting lipid panel 2. Repeat head CT in 24 hours 3. PT consult, OT consult, Speech consult 4. Echocardiogram 5. Carotid dopplers 6. Prophylactic therapy-Patient unable to go on anticoagulation secondary to fall risk.   7. Risk factor modification 8. Telemetry monitoring 9. Frequent neuro checks   Thana Farr, MD [facility] [phone] 06/13/2014, 6:13 AM

## 2014-06-13 NOTE — Progress Notes (Signed)
Occupational Therapy Evaluation Patient Details Name: [name] [name] MRN: [id number] DOB: [birth year] Today's Date: 06/13/2014    History of Present Illness [name] [name] is an 80 y.o. Female admitted 06/12/14 following a fall  at home. Pt c/o Lt hip pain but negative for fx. Pt has also c/o double vision, but likely related to [facility]. CT pending for stroke workup. PMH of Dm, CAD, HTN, CAD, CKD stage III, COPD with recent hospitalizations for pneumonia and sepsis.   Clinical Impression   PTA pt lived at home alone and had assistance from her family for ADLs. Pt currently requires assistance for functional transfers and well as LB and UB ADLs due to deconditioning, weakness, and LUE pain and immobilization. Pt will benefit from SNF at d/c as well as acute OT to address toilet transfers and self-feeding/grooming tasks in sitting.     Follow Up Recommendations  SNF;Supervision/Assistance - 24 hour    Equipment Recommendations  Other (comment) (defer to SNF)    Recommendations for Other Services       Precautions / Restrictions Precautions Precautions: Fall Precaution Comments: watch BP Restrictions Weight Bearing Restrictions: No      Mobility Bed Mobility Overal bed mobility: Needs Assistance Bed Mobility: Supine to Sit     Supine to sit: Mod assist;HOB elevated     General bed mobility comments: Mod A to elevate trunk off bed and use of bed pad to scoot hips around.   Transfers Overall transfer level: Needs assistance Equipment used: Rolling walker (2 wheeled) Transfers: Sit to/from [employer] Sit to Stand: Min assist Stand pivot transfers: Mod assist       General transfer comment: Min (A) to power up from bed with assist provided on Right side due to LUE pain. Pt required increased assistance to manage walker and to maintain balance while taking pivotal steps. Pt became fatigued quickly and attempted to sit before safely reaching recliner and OT had  to support pt until she was safely positioned to sit.     Balance Overall balance assessment: Needs assistance Sitting-balance support: Single extremity supported;Feet supported Sitting balance-Leahy Scale: Poor     Standing balance support: Bilateral upper extremity supported;During functional activity Standing balance-Leahy Scale: Poor Standing balance comment: Pt requires UE support on RW and requires (A) to maintain balance.                             ADL Overall ADL's : Needs assistance/impaired Eating/Feeding: Sitting;Set up Eating/Feeding Details (indicate cue type and reason): Pt needs assistance to cut tough foods due to LUE pain and limited ROM Grooming: Minimal assistance;Sitting   Upper Body Bathing: Minimal assitance;Sitting   Lower Body Bathing: Maximal assistance;Sit to/from stand   Upper Body Dressing : Moderate assistance;Sitting   Lower Body Dressing: Total assistance;Sit to/from stand   Toilet Transfer: Moderate assistance;Stand-pivot;RW (bed>recliner)   Toileting- Clothing Manipulation and Hygiene: Total assistance;Sit to/from stand         General ADL Comments: Pt was agreeable to get OOB and reported relief when sitting upright. Pt denied double vision in bed, however after sitting up in chair c/o "double vision." BP was 87/50 and offered to return pt to bed, however she and family wanted her to sit up for a bit and OT recommended reclining chair to help with OH.        Vision  Pt c/o diplopia, however only when sitting upright and is likely related to  OH. Pt denies "double vision" when supine in bed.                    Perception Perception Perception Tested?: No   Praxis Praxis Praxis tested?: Within functional limits    Pertinent Vitals/Pain Pain Assessment: Faces Faces Pain Scale: Hurts even more Pain Location: Lt hip/leg and left arm Pain Descriptors / Indicators: Constant Pain Intervention(s): Limited activity within  patient's tolerance;Monitored during session;Repositioned     Hand Dominance Right   Extremity/Trunk Assessment Upper Extremity Assessment Upper Extremity Assessment: LUE deficits/detail LUE Deficits / Details: Pt reports rotator cuff injury and has limited ROM with left arm. Pt reports it is painful to use and has difficulty WB through LUE when using walker.  LUE: Unable to fully assess due to pain LUE Coordination: decreased gross motor   Lower Extremity Assessment Lower Extremity Assessment: Generalized weakness;Defer to PT evaluation   Cervical / Trunk Assessment Cervical / Trunk Assessment: Kyphotic (forward head)   Communication Communication Communication: No difficulties   Cognition Arousal/Alertness: Awake/alert Behavior During Therapy: WFL for tasks assessed/performed Overall Cognitive Status: Within Functional Limits for tasks assessed                                Home Living Family/patient expects to be discharged to:: Skilled nursing facility                                 Additional Comments: Family and pt in agreement regarding SNF at d/c due to deconditioning from recent health history.       Prior Functioning/Environment Level of Independence: Needs assistance  Gait / Transfers Assistance Needed: Pt ambulates with RW and reports that she fell after tripped on her walker at home ADL's / Homemaking Assistance Needed: Pt reports that her daughter assists with bathing and dressing and family provides her with meals.         OT Diagnosis: Generalized weakness;Acute pain   OT Problem List: Decreased strength;Decreased range of motion;Decreased activity tolerance;Impaired balance (sitting and/or standing);Decreased safety awareness;Impaired UE functional use;Pain   OT Treatment/Interventions: Self-care/ADL training;Therapeutic exercise;Energy conservation;DME and/or AE instruction;Therapeutic activities;Patient/family  education;Balance training    OT Goals(Current goals can be found in the care plan section) Acute Rehab OT Goals Patient Stated Goal: to get my strength back OT Goal Formulation: With patient/family Time For Goal Achievement: 06/27/14 Potential to Achieve Goals: Good ADL Goals Pt Will Perform Eating: with modified independence;with adaptive utensils;sitting Pt Will Perform Grooming: with modified independence;sitting Pt Will Transfer to Toilet: with min guard assist;stand pivot transfer;bedside commode Pt Will Perform Toileting - Clothing Manipulation and hygiene: with min guard assist;sit to/from stand  OT Frequency: Min 2X/week    End of Session Equipment Utilized During Treatment: Gait belt;Rolling walker Nurse Communication: Mobility status;Other (comment) (Vitals)  Activity Tolerance: Patient tolerated treatment well;Patient limited by fatigue Patient left: in chair;with call bell/phone within reach;with family/visitor present;Other (comment) (with chair alarm pad in chair if RN feels necessary)   Time: 1610-9604 OT Time Calculation (min): 33 min Charges:  OT General Charges $OT Visit: 1 Procedure OT Evaluation $Initial OT Evaluation Tier I: 1 Procedure OT Treatments $Self Care/Home Management : 8-22 mins $Therapeutic Activity: 8-22 mins G-Codes: OT G-codes **NOT FOR INPATIENT CLASS** Functional Assessment Tool Used: clinical judgement Functional Limitation: Self care Self Care Current Status (V4098): At least 60  percent but less than 80 percent impaired, limited or restricted Self Care Goal Status (Z6109(G8988): At least 40 percent but less than 60 percent impaired, limited or restricted  Rae LipsMiller, Amiley Shishido M 06/13/2014, 1:48 PM  Carney LivingLeeAnn Marie Les Longmore, OTR/L Occupational Therapist [phone] (pager)

## 2014-06-13 NOTE — Progress Notes (Signed)
ER. nurse called and notify nurse of pt. complaints  of having double vision.Pt. admitted to the floor  at around 2145. Pt. Denies having double vision at this time. Pt. Stated she did earlier but not anymore.

## 2014-06-13 NOTE — Progress Notes (Signed)
Patient Demographics  [name] [name], is a 80 y.o. female, DOB - November 03, 1924, AVW:[id number]  Admit date - 06/12/2014   Admitting Physician Hillary BowJared M Gardner, DO  Outpatient Primary MD for the patient is Josue HectorNYLAND,LEONARD ROBERT, MD  LOS - 1   Chief Complaint  Patient presents with  . Fall        Subjective:   [name] [name] today has, No headache, No chest pain, No abdominal pain - No Nausea, No new weakness tingling or numbness, No Cough - SOB. Mild left hip pain.    Assessment & Plan    1. Mechanical Fall - tripped - hit her head, left side of her body on the floor. Has some musculoskeletal pain and ache, however CT scan head, C and Lspine, left hip are all nonacute, no focal deficits on exam. There was diplopia on admission. Improved now, could've had a concussion, stroke cannot be ruled out as she has an AICD. Repeat CT scan in the morning.     2. Chronic systolic heart failure. EF 35% with chronic wall motion abnormality on echo done earlier this year. She is currently compensated from this standpoint, on amiodarone, aspirin, Coreg and Imdur which will be continued.    3. GERD. On PPI.     4. Hypothyroidism continue Synthroid.    5.ARF on CKD stage IV. Baseline creatinine 1.5, Get Ur lytes, gentle IVF, repeat BMP in the morning. Hold any ACE/ARB.     6. History of urinary retention. On alpha blocker continue.      Code Status: Full  Family Communication: none present  Disposition Plan: TBD likely SNF   Procedures CT head, C and L-spine - Hip  Consults None   Medications  Scheduled Meds: . amiodarone  200 mg Oral Daily  . aspirin EC  81 mg Oral Daily  . carvedilol  12.5 mg Oral BID WC  . [START ON 07/12/2014] cyanocobalamin  1,000 mcg Intramuscular Q30 days  .  docusate sodium  100 mg Oral Daily  . guaiFENesin  600 mg Oral BID  . HYDROcodone-acetaminophen  1 tablet Oral 4 times per day  . isosorbide mononitrate  15 mg Oral Daily  . lactose free nutrition  237 mL Oral TID BM  . levothyroxine  88 mcg Oral QAC breakfast  . LORazepam  0.5 mg Oral QHS  . pantoprazole  80 mg Oral Daily  . tamsulosin  0.4 mg Oral Daily   Continuous Infusions: . sodium chloride     PRN Meds:.acetaminophen, benzonatate  DVT Prophylaxis   SCDs    Lab Results  Component Value Date   PLT 160 06/12/2014    Antibiotics    Anti-infectives    None          Objective:   Filed Vitals:   06/12/14 1830 06/12/14 2100 06/12/14 2147 06/13/14 0530  BP: 147/69 127/51 147/71 108/48  Pulse: 69 70 70 70  Temp:   97.6 F (36.4 C) 98.2 F (36.8 C)  TempSrc:   Oral Oral  Resp: 18 18 18 18   Height:   5\' 4"  (1.626 m)   Weight:   52.935 kg (116 lb 11.2 oz)   SpO2: 100% 90% 97% 99%    Wt Readings from Last 3  Encounters:  06/12/14 52.935 kg (116 lb 11.2 oz)  05/23/14 52.708 kg (116 lb 3.2 oz)  04/20/14 46.9 kg (103 lb 6.3 oz)     Intake/Output Summary (Last 24 hours) at 06/13/14 0951 Last data filed at 06/13/14 0500  Gross per 24 hour  Intake    360 ml  Output    250 ml  Net    110 ml     Physical Exam  Awake Alert, Oriented X 3, No new F.N deficits, Normal affect [place].AT,PERRAL Supple Neck,No JVD, No cervical lymphadenopathy appriciated.  Symmetrical Chest wall movement, Good air movement bilaterally, CTAB RRR,No Gallops,Rubs or new Murmurs, No Parasternal Heave +ve B.Sounds, Abd Soft, No tenderness, No organomegaly appriciated, No rebound - guarding or rigidity. No Cyanosis, Clubbing or edema, No new Rash or bruise      Data Review   Micro Results No results found for this or any previous visit (from the past 240 hour(s)).  Radiology Reports Dg Chest 2 View  06/12/2014   CLINICAL DATA:  Fall 1 day prior. History of atrial fibrillation.  Hypertension.  EXAM: CHEST  2 VIEW  COMPARISON:  May 20, 2014  FINDINGS: There is scarring in the right mid lung and left base regions. There is no frank edema or consolidation. The heart is slightly enlarged with pulmonary vascularity within normal limits. Pacemaker leads are attached to the right atrium and right ventricle. Patient is status post coronary artery bypass grafting. No adenopathy. No pneumothorax. There is arthropathy in both shoulders.  IMPRESSION: Areas of lung scarring bilaterally. No edema or consolidation. Heart prominent but stable.   Electronically Signed   By: Bretta Bang M.D.   On: 06/12/2014 15:00   Dg Chest 2 View  05/20/2014   CLINICAL DATA:  Pain. Weakness. Shortness of breath. Prior infiltrate. Follow-up study.  EXAM: CHEST  2 VIEW  COMPARISON:  04/18/2014.  06/27/2013.  FINDINGS: Mediastinum and hilar structures are normal. Subsegmental atelectasis and/or scarring right mid lung field again noted. New right lower lobe infiltrate with small pleural effusion noted. Previously identified right upper lobe infiltrate is clear. Small left pleural effusion. Cardiomegaly. Prior CABG. Cardiac pacer and lead tips in right atrium right ventricle. No pneumothorax. No acute bony abnormality. Old posttraumatic changes left proximal humerus.  IMPRESSION: 1. New mild infiltrate right lower lobe with small right pleural effusion. 2. Interim clearing of right upper lobe infiltrate. 3. Stable pleural parenchymal scarring right mid lung field. 4. Stable cardiomegaly. Prior CABG. Cardiac pacer in stable position .   Electronically Signed   By: Maisie Fus  Register   On: 05/20/2014 18:02   Dg Lumbar Spine Complete  06/12/2014   CLINICAL DATA:  Low back pain following fall earlier in the day  EXAM: LUMBAR SPINE - COMPLETE 4+ VIEW  COMPARISON:  February 19, 2012  FINDINGS: Frontal, lateral, spot lumbosacral lateral, and bilateral oblique views were obtained. There are 5 non-rib-bearing lumbar type  vertebral bodies. There is no fracture or spondylolisthesis. There is moderately severe disc space narrowing at L4-5 and L5-S1. There is mild disc space narrowing at T12-L1 and L1-2. There is facet osteoarthritic change at all levels bilaterally. There is atherosclerotic change in the aorta as well as in the major mesenteric arteries. There are stents in each renal artery. Bones are osteoporotic.  IMPRESSION: Bones are osteoporotic. There is multilevel osteoarthritic change. No fracture or spondylolisthesis. Extensive atherosclerotic calcification.   Electronically Signed   By: Bretta Bang M.D.   On: 06/12/2014 13:04  Dg Shoulder Right  06/12/2014   CLINICAL DATA:  Fall with right shoulder pain.  EXAM: RIGHT SHOULDER - 2+ VIEW  COMPARISON:  None.  FINDINGS: No acute fracture or dislocation is identified. There are advanced changes of osteoarthritis involving the glenohumeral joint. Milder degenerative changes are seen involving the [facility] joint. The [facility] joint itself shows no obvious sprain or separation. There is suggestion of soft tissue swelling overlying the distal clavicle and acromion. Sclerotic bone island present in the posterior humeral head.  IMPRESSION: No acute fracture. Significant degenerative osteoarthritis of the glenohumeral joint. Soft tissue swelling overlying the AC joint may relate to focal soft tissue injury.   Electronically Signed   By: Irish Lack M.D.   On: 06/12/2014 13:01   Dg Hip Complete Left  06/12/2014   CLINICAL DATA:  Fall this morning.  EXAM: LEFT HIP - COMPLETE 2+ VIEW  COMPARISON:  None.  FINDINGS: Osteopenia. No acute fracture. No dislocation. Degenerative changes in the lumbar spine.  IMPRESSION: No acute bony pathology.   Electronically Signed   By: Maryclare Bean M.D.   On: 06/12/2014 13:01   Ct Head Wo Contrast  06/12/2014   CLINICAL DATA:  Patient fell and found on floor. Complaining of headache and neck pain  EXAM: CT HEAD WITHOUT CONTRAST  CT CERVICAL SPINE  WITHOUT CONTRAST  TECHNIQUE: Multidetector CT imaging of the head and cervical spine was performed following the standard protocol without intravenous contrast. Multiplanar CT image reconstructions of the cervical spine were also generated.  COMPARISON:  Head CT February 04, 2014  FINDINGS: CT HEAD FINDINGS  There is moderate diffuse atrophy. There is no intracranial mass, hemorrhage, extra-axial fluid collection, or midline shift. There is small vessel disease in the centra semiovale bilaterally, more on the left than on the right, stable. There is evidence of prior small vessel disease in the anterior limb of the left external capsule, stable. To a lesser degree there is small vessel disease in the anterior limb of the left internal capsule, stable.  Compared to the prior study, there is decreased attenuation in the mid and posterior superior left temporal lobe extending to the left temple-occipital junction. A recent infarct in this area is of concern. This appearance is best seen on axial slice 14 series 201. No other findings concerning for potential acute infarct identified.  There is a small right occipital scalp hematoma with acute hemorrhage in this area. The bony calvarium appears intact. The mastoid air cells are clear. There is bilateral ethmoid sinus disease.  CT CERVICAL SPINE FINDINGS  There is no fracture or spondylolisthesis. Prevertebral soft tissues and predental space regions are normal.  There is severe disc space narrowing at C5-6. There is moderately severe disc space narrowing at C6-7. There is facet hypertrophy at multiple levels bilaterally. There is no frank disc extrusion or stenosis. Note that there is fairly extensive bony overgrowth at C1 on the left.  There is marked osteoarthritic change in both temporomandibular joints. There is calcification in each carotid artery.  There are bilateral pleural effusions.  IMPRESSION: CT head: Atrophy with supratentorial small vessel disease. Question  recent infarct in the left superior mid to posterior temporal lobe. No intracranial hemorrhage or mass effect. There is a small right occipital scalp hematoma. There is ethmoid sinus disease bilaterally.  CT cervical spine: Multilevel osteoarthritic change. No fracture or spondylolisthesis. Calcification is noted in each carotid artery.  There are bilateral pleural effusions. Correlation with chest radiograph may be advisable in  this regard.   Electronically Signed   By: Bretta Bang M.D.   On: 06/12/2014 13:29   Ct Cervical Spine Wo Contrast  06/12/2014   CLINICAL DATA:  Patient fell and found on floor. Complaining of headache and neck pain  EXAM: CT HEAD WITHOUT CONTRAST  CT CERVICAL SPINE WITHOUT CONTRAST  TECHNIQUE: Multidetector CT imaging of the head and cervical spine was performed following the standard protocol without intravenous contrast. Multiplanar CT image reconstructions of the cervical spine were also generated.  COMPARISON:  Head CT February 04, 2014  FINDINGS: CT HEAD FINDINGS  There is moderate diffuse atrophy. There is no intracranial mass, hemorrhage, extra-axial fluid collection, or midline shift. There is small vessel disease in the centra semiovale bilaterally, more on the left than on the right, stable. There is evidence of prior small vessel disease in the anterior limb of the left external capsule, stable. To a lesser degree there is small vessel disease in the anterior limb of the left internal capsule, stable.  Compared to the prior study, there is decreased attenuation in the mid and posterior superior left temporal lobe extending to the left temple-occipital junction. A recent infarct in this area is of concern. This appearance is best seen on axial slice 14 series 201. No other findings concerning for potential acute infarct identified.  There is a small right occipital scalp hematoma with acute hemorrhage in this area. The bony calvarium appears intact. The mastoid air cells are  clear. There is bilateral ethmoid sinus disease.  CT CERVICAL SPINE FINDINGS  There is no fracture or spondylolisthesis. Prevertebral soft tissues and predental space regions are normal.  There is severe disc space narrowing at C5-6. There is moderately severe disc space narrowing at C6-7. There is facet hypertrophy at multiple levels bilaterally. There is no frank disc extrusion or stenosis. Note that there is fairly extensive bony overgrowth at C1 on the left.  There is marked osteoarthritic change in both temporomandibular joints. There is calcification in each carotid artery.  There are bilateral pleural effusions.  IMPRESSION: CT head: Atrophy with supratentorial small vessel disease. Question recent infarct in the left superior mid to posterior temporal lobe. No intracranial hemorrhage or mass effect. There is a small right occipital scalp hematoma. There is ethmoid sinus disease bilaterally.  CT cervical spine: Multilevel osteoarthritic change. No fracture or spondylolisthesis. Calcification is noted in each carotid artery.  There are bilateral pleural effusions. Correlation with chest radiograph may be advisable in this regard.   Electronically Signed   By: Bretta Bang M.D.   On: 06/12/2014 13:29   Ct Hip Left Wo Contrast  06/12/2014   CLINICAL DATA:  Left hip pain after tripping and falling  EXAM: CT OF THE LEFT HIP WITHOUT CONTRAST  TECHNIQUE: Multidetector CT imaging of the left hip was performed according to the standard protocol. Multiplanar CT image reconstructions were also generated.  COMPARISON:  Pelvis and left hip radiographs June 12, 2014  FINDINGS: There is no demonstrable fracture or dislocation. Bones are osteoporotic. There is slight narrowing of the left hip joint. No erosive change. There is no appreciable hip joint effusion. The surrounding muscular structures appear normal. There is calcification in the wall of the common femoral artery on the left. No soft tissue lesion is  identified in the visualized pelvis.  IMPRESSION: No fracture or dislocation. Bones osteoporotic. Mild narrowing left hip joint.   Electronically Signed   By: Bretta Bang M.D.   On: 06/12/2014 18:01  CBC  Recent Labs Lab 06/12/14 1210  WBC 7.5  HGB 11.5*  HCT 35.6*  PLT 160  MCV 81.5  MCH 26.3  MCHC 32.3  RDW 18.9*    Chemistries   Recent Labs Lab 06/12/14 1210 06/12/14 1317 06/13/14 0655  NA 132* 135* 133*  K 5.0 4.9 4.5  CL 94* 97 98  CO2 19 18* 19  GLUCOSE 117* 107* 103*  BUN 42* 43* 45*  CREATININE 1.93* 1.91* 2.01*  CALCIUM 9.2 9.2 8.4   ------------------------------------------------------------------------------------------------------------------ estimated creatinine clearance is 16.8 mL/min (by C-G formula based on Cr of 2.01). ------------------------------------------------------------------------------------------------------------------ No results for input(s): HGBA1C in the last 72 hours. ------------------------------------------------------------------------------------------------------------------ No results for input(s): CHOL, HDL, LDLCALC, TRIG, CHOLHDL, LDLDIRECT in the last 72 hours. ------------------------------------------------------------------------------------------------------------------ No results for input(s): TSH, T4TOTAL, T3FREE, THYROIDAB in the last 72 hours.  Invalid input(s): FREET3 ------------------------------------------------------------------------------------------------------------------ No results for input(s): VITAMINB12, FOLATE, FERRITIN, TIBC, IRON, RETICCTPCT in the last 72 hours.  Coagulation profile No results for input(s): INR, PROTIME in the last 168 hours.  No results for input(s): DDIMER in the last 72 hours.  Cardiac Enzymes No results for input(s): CKMB, TROPONINI, MYOGLOBIN in the last 168 hours.  Invalid input(s):  CK ------------------------------------------------------------------------------------------------------------------ Invalid input(s): POCBNP     Time Spent in minutes   35   SINGH,PRASHANT K M.D on 06/13/2014 at 9:51 AM  Between 7am to 7pm - Pager - [phone]  After 7pm go to [email or web address] - password [email or web address]  And look for the night coverage person covering for me after hours  [facility] Office  [phone]

## 2014-06-13 NOTE — Progress Notes (Signed)
  Echocardiogram 2D Echocardiogram has been performed.  [name] [name] 06/13/2014, 10:30 AM

## 2014-06-14 ENCOUNTER — Inpatient Hospital Stay (HOSPITAL_COMMUNITY): Payer: Medicare Other

## 2014-06-14 LAB — OSMOLALITY, URINE: Osmolality, Ur: 377 mOsm/kg — ABNORMAL LOW (ref 390–1090)

## 2014-06-14 LAB — BASIC METABOLIC PANEL
Anion gap: 15 (ref 5–15)
BUN: 50 mg/dL — ABNORMAL HIGH (ref 6–23)
CALCIUM: 8.1 mg/dL — AB (ref 8.4–10.5)
CO2: 18 mEq/L — ABNORMAL LOW (ref 19–32)
CREATININE: 2.05 mg/dL — AB (ref 0.50–1.10)
Chloride: 100 mEq/L (ref 96–112)
GFR calc Af Amer: 24 mL/min — ABNORMAL LOW (ref >90)
GFR calc non Af Amer: 21 mL/min — ABNORMAL LOW (ref >90)
GLUCOSE: 118 mg/dL — AB (ref 70–99)
Potassium: 4.2 mEq/L (ref 3.7–5.3)
Sodium: 133 mEq/L — ABNORMAL LOW (ref 137–147)

## 2014-06-14 MED ORDER — TRAMADOL HCL 50 MG PO TABS
50.0000 mg | ORAL_TABLET | Freq: Four times a day (QID) | ORAL | Status: DC | PRN
Start: 2014-06-14 — End: 2014-06-15
  Administered 2014-06-15: 50 mg via ORAL
  Filled 2014-06-14: qty 1

## 2014-06-14 MED ORDER — ENSURE COMPLETE PO LIQD
237.0000 mL | Freq: Three times a day (TID) | ORAL | Status: DC
  Administered 2014-06-14 – 2014-06-15 (×2): 237 mL via ORAL

## 2014-06-14 MED ORDER — BOOST / RESOURCE BREEZE PO LIQD: 1.0000 | Freq: Every day | ORAL | Status: DC

## 2014-06-14 MED ORDER — ACETAMINOPHEN 500 MG PO TABS
1000.0000 mg | ORAL_TABLET | Freq: Three times a day (TID) | ORAL | Status: DC
  Administered 2014-06-14 – 2014-06-15 (×3): 1000 mg via ORAL
  Filled 2014-06-14 (×6): qty 2

## 2014-06-14 MED ORDER — CARVEDILOL 6.25 MG PO TABS
6.2500 mg | ORAL_TABLET | Freq: Two times a day (BID) | ORAL | Status: DC
  Administered 2014-06-14 – 2014-06-15 (×3): 6.25 mg via ORAL
  Filled 2014-06-14 (×7): qty 1

## 2014-06-14 MED ORDER — FUROSEMIDE 40 MG PO TABS
40.0000 mg | ORAL_TABLET | Freq: Every day | ORAL | Status: DC
  Administered 2014-06-14 – 2014-06-15 (×2): 40 mg via ORAL
  Filled 2014-06-14 (×3): qty 1

## 2014-06-14 NOTE — Progress Notes (Signed)
Physical Therapy Treatment Patient Details Name: [name] [name] MRN: [id number] DOB: 25-Apr-1925 Today's Date: 06/14/2014    History of Present Illness [name] [name] is an 80 y.o. Female admitted 06/12/14 following a fall  at home. Pt c/o Lt hip pain but negative for fx. Pt has also c/o double vision, but likely related to [facility]. CT pending for stroke workup. PMH of Dm, CAD, HTN, CAD, CKD stage III, COPD with recent hospitalizations for pneumonia and sepsis.    PT Comments    Pt reports is more painful today than yesterday; Mobility was limited to transferring OOB today secondary to pain  Follow Up Recommendations  SNF     Equipment Recommendations  None recommended by PT    Recommendations for Other Services       Precautions / Restrictions Precautions Precautions: Fall Precaution Comments: watch BP if working on upright standing tolerance    Mobility  Bed Mobility Overal bed mobility: Needs Assistance Bed Mobility: Supine to Sit     Supine to sit: Mod assist;HOB elevated;+2 for physical assistance     General bed mobility comments: Heavy mod assist with support given at back and use of bed pad to square off hips at EOB; very painful with movement  Transfers Overall transfer level: Needs assistance Equipment used: Rolling walker (2 wheeled) Transfers: Sit to/from Stand Sit to Stand: Mod assist Stand pivot transfers: Mod assist       General transfer comment: Mod assist to powrup to stand; mod assist for steadiness and support during transitionto recliner placed on pt's less painful R side  Ambulation/Gait             General Gait Details: pt declined today due to pain   Stairs            Wheelchair Mobility    Modified Rankin (Stroke Patients Only)       Balance                                    Cognition Arousal/Alertness: Awake/alert Behavior During Therapy: WFL for tasks assessed/performed (though very hesitant to  participate) Overall Cognitive Status: Within Functional Limits for tasks assessed                      Exercises      General Comments        Pertinent Vitals/Pain Pain Assessment: Faces Faces Pain Scale: Hurts even more Pain Location: L side of body Pain Descriptors / Indicators: Aching;Grimacing Pain Intervention(s): Limited activity within patient's tolerance;Repositioned;Monitored during session    Home Living                      Prior Function            PT Goals (current goals can now be found in the care plan section) Acute Rehab PT Goals PT Goal Formulation: With patient Time For Goal Achievement: 06/27/14 Potential to Achieve Goals: Fair Progress towards PT goals: Not progressing toward goals - comment (More painful today than yesterday)    Frequency  Min 3X/week    PT Plan Current plan remains appropriate    Co-evaluation             End of Session Equipment Utilized During Treatment: Gait belt Activity Tolerance: Patient limited by pain Patient left: in chair;with call bell/phone within reach;with family/visitor present  Time: 1130-1146 PT Time Calculation (min): 16 min  Charges:  $Therapeutic Activity: 8-22 mins                    G Codes:      Van ClinesGarrigan, Bryannah Boston Hamff 06/14/2014, 1:17 PM  Van ClinesHolly Kailyn Vanderslice, [place]PT  Acute Rehabilitation Services Pager [phone] Office [phone]'

## 2014-06-14 NOTE — Progress Notes (Signed)
INITIAL NUTRITION ASSESSMENT  Pt meets criteria for NON-SEVERE (MODERATE) MALNUTRITION in the context of chronic illness as evidenced by moderate fat and muscle mass loss.  DOCUMENTATION CODES Per approved criteria  -Non-severe (moderate) malnutrition in the context of chronic illness   INTERVENTION: Discontinue Boost Plus.  Provide vanilla Ensure Complete po TID, each supplement provides 350 kcal and 13 grams of protein.  Provide Resource Breeze po once daily, each supplement provides 250 kcal and 9 grams of protein.  Encourage PO intake.  NUTRITION DIAGNOSIS: Increased nutrient needs related to chronic illness, COPD as evidenced by estimated nutrition needs.   Goal: Pt to meet >/= 90% of their estimated nutrition needs   Monitor:  PO intake, weight trends, labs, I/O's  Reason for Assessment: MST  80 y.o. female  Admitting Dx: Acute pain of left hip  ASSESSMENT: Pt who suffered a mechanical fall at home. She landed on her left side. She was down for about 6 hours before family members found her and called EMS. She hit her head on floor but denies LOC. Pt with CKD stage III, COPD, DM, DJD, CAD, CHF, GERD, gout, anemia.  Pt reports her appetite is good, however meal completion is 25%. Family reports pt has been eating at least 3 meals a day, but reports portions have been decreasing. Pt is also drinking Boost at home twice a day. Nutrition has been adequate. Pt reports no recent weight loss with her usual body weight of 106 lbs. Pt has Boost Plus ordered, but reports she does not like the chocolate as she drinks vanilla at home. Will order Ensure instead. Pt also like [employer]. Will order. Pt was encouraged to eat her food at meals and to drink her supplements.   Nutrition Focused Physical Exam:  Subcutaneous Fat:  Orbital Region: N/A Upper Arm Region: Moderate depletion Thoracic and Lumbar Region: N/A  Muscle:  Temple Region: Moderate depletion Clavicle Bone  Region: Moderate depletion Clavicle and Acromion Bone Region: Moderate dpeletion Scapular Bone Region: N/A Dorsal Hand: Moderate depletion Patellar Region: WNL Anterior Thigh Region: WNL Posterior Calf Region: WNL  Edema: non-pitting LUE, +2 RLE, +1 LLE   Labs:Low sodium, CO2, calcium, and GFR. High BUN and creatinine.   Height: Ht Readings from Last 1 Encounters:  06/12/14 5\' 4"  (1.626 m)    Weight: Wt Readings from Last 1 Encounters:  06/12/14 116 lb 11.2 oz (52.935 kg)    Ideal Body Weight: 120 lbs  % Ideal Body Weight: 97%  Wt Readings from Last 10 Encounters:  06/12/14 116 lb 11.2 oz (52.935 kg)  05/23/14 116 lb 3.2 oz (52.708 kg)  04/20/14 103 lb 6.3 oz (46.9 kg)  03/10/14 106 lb (48.081 kg)  02/07/14 103 lb (46.72 kg)  01/28/14 106 lb 8 oz (48.308 kg)  02/04/13 116 lb 6.4 oz (52.799 kg)  02/04/13 116 lb 6.4 oz (52.799 kg)  01/11/13 121 lb 11.1 oz (55.2 kg)  11/27/12 108 lb 4.8 oz (49.125 kg)    Usual Body Weight: 106 lbs  % Usual Body Weight: 109%  BMI:  Body mass index is 20.02 kg/(m^2).  Estimated Nutritional Needs: Kcal: 1450-1650 Protein: 55-65 grams  Fluid: >1.5 Lday  Skin: lateral laceration on right head, non-pitting LUE, +2 RLE, +1 LLE edema  Diet Order: Diet Heart  EDUCATION NEEDS: -No education needs identified at this time   Intake/Output Summary (Last 24 hours) at 06/14/14 1144 Last data filed at 06/14/14 0900  Gross per 24 hour  Intake  720 ml  Output    100 ml  Net    620 ml    Last BM: 10/29  Labs:   Recent Labs Lab 06/12/14 1317 06/13/14 0655 06/14/14 0530  NA 135* 133* 133*  K 4.9 4.5 4.2  CL 97 98 100  CO2 18* 19 18*  BUN 43* 45* 50*  CREATININE 1.91* 2.01* 2.05*  CALCIUM 9.2 8.4 8.1*  GLUCOSE 107* 103* 118*    CBG (last 3)   Recent Labs  06/13/14 0002  GLUCAP 152*    Scheduled Meds: . acetaminophen  1,000 mg Oral 3 times per day  . amiodarone  200 mg Oral Daily  . aspirin EC  81 mg Oral Daily   . carvedilol  6.25 mg Oral BID WC  . [START ON 07/12/2014] cyanocobalamin  1,000 mcg Intramuscular Q30 days  . docusate sodium  100 mg Oral Daily  . furosemide  40 mg Oral Daily  . guaiFENesin  600 mg Oral BID  . lactose free nutrition  237 mL Oral TID BM  . levothyroxine  88 mcg Oral QAC breakfast  . LORazepam  0.5 mg Oral QHS  . tamsulosin  0.4 mg Oral Daily    Continuous Infusions:   Past Medical History  Diagnosis Date  . Diabetes mellitus without complication   . DJD (degenerative joint disease)   . Cardiomegaly   . Coronary artery disease     a. LAD stent 1999 after NSTEMI. b. CABG 2004. c. Last LHC 02/2010 - patent grafts.  . Hypertension   . Hyperlipidemia   . Carotid artery disease 05/2003    a. s/p right carotid endarterectomy 05/2003  . Degenerative joint disease   . Restless leg syndrome   . CKD (chronic kidney disease), stage III   . Heparin induced thrombocytopenia   . Diphtheria     "as a child"  . Pleural effusion, bilateral 06/2003  . Pseudoaneurysm 01/1998    a. s/p repair following catherization in 01/1998  . Chronic systolic heart failure 10/02/11  . Ischemic cardiomyopathy     Echo 10/01/11: Mild LVH, EF 35-40%, basal inferior, basal to mid posterior and basal to mid anterolateral severe hypokinesis, mild to moderate AI, moderate MR (ischemic MR), moderate LAE, mild RVE, mildly reduced RV systolic function, mild RAE, PASP 43-44  . COPD (chronic obstructive pulmonary disease)   . Atrial fibrillation/flutter     s/p multiple DCCVs;  amiodarone started 09/2011, TEE DCCV 3/13;  [facility]mad[facility] with [facility]/syncope  . Tachycardia-bradycardia syndrome     a. s/p [employer] PPM 09/2011: SN: [id number] H (8 sec pause noted)  . GERD (gastroesophageal reflux disease)   . Renal artery stenosis     bilateral- status post stenting  . Non-functioning kidney   . Depression     "cries alot"  . Gout   . Hypothyroidism   . Anemia      Past Surgical History  Procedure Laterality Date  . Carotid endarterectomy  05/2003    right  . Pacemaker insertion  10/02/11    implanted by Dr Johney FrameAllred  . Cholecystectomy  ?02/2011  . Tonsillectomy and adenoidectomy    . Renal artery stent  06/2003    bilaterally/e-chart  . Thoracentesis  ? 2000; 05/2011  . Coronary angioplasty with stent placement  1999  . Av fistula repair  01/1998    w/pseudoaneurysm repair S/P catheterization/E-chart  . Coronary artery bypass graft  05/2003    CABG  X 3  . Tee without cardioversion  10/23/2011    Procedure: TRANSESOPHAGEAL ECHOCARDIOGRAM (TEE);  Surgeon: Wendall Stade, MD;  Location: [facility] ENDOSCOPY;  Service: Cardiovascular;  Laterality: N/A;  . Cardioversion  10/23/2011    Procedure: CARDIOVERSION;  Surgeon: Wendall Stade, MD;  Location: [facility] ENDOSCOPY;  Service: Cardiovascular;  Laterality: N/A;    Marijean Niemann, MS, RD, LDN Pager # [phone] After hours/ weekend pager # [phone]

## 2014-06-14 NOTE — Progress Notes (Addendum)
PATIENT DETAILS Name: Newman NipVictoria E Donaway Age: 80 y.o. Sex: female Date of Birth: 04-Aug-1925 Admit Date: 06/12/2014 Admitting Physician Hillary BowJared M Gardner, DO YNW:GNFAOZ,HYQMVHQPCP:NYLAND,LEONARD Molly MaduroOBERT, MD  Subjective: Still complains of pain in the left hip. But claims to be better.  Assessment/Plan: Principal Problem:   Acute pain of left ION:GEXBMWUhip:suspect secondary to musculoskeletal etiology from fall. CT of the left hip negative for fracture. PT eval, supportive care.Will need SNF on discharge  Active Problems: Diplopia:Resolved. Some concern for CVA given history of A. Fib, CT scan of the head on 10/31 negative.Seen by neurology, plans are to repeat CT head on 11/2. 2-D echocardiogram showed an EF around 20-25%,carotid Doppler pending.unable to do MRI as has AICD in place.  Chronic systolic heart failure: clinically compensated, echocardiogram done this admission shows an EF around 20-25%. Resume Lasix.continue Coreg.Not on ACEI given CKD.   CKD stage 3, GFR 30-59 ml/min:monitor electrolytes closely.Creatinine close to usual baseline.  History of atrial fibrillation:rate controlled with amiodarone, Coreg. Not a anticoagulation candidate given history of falls, maintained on aspirin.  History of coronary artery disease status post CABG:stable, continue aspirin,beta blocker.  Hypothyroidism: continue with levothyroxine  GERD:On PPI.  Moderate Protein Calorie Malnutrition:on supplements  Disposition: Remain inpatient-SNF on discharge  Antibiotics:  None  DVT Prophylaxis:  SCD's  Code Status:  DNR  Family Communication None at bedside  Addendum:spoke with Daughter-Pam-over the phone.   Procedures:  None  CONSULTS:  None  MEDICATIONS: Scheduled Meds: . acetaminophen  1,000 mg Oral 3 times per day  . amiodarone  200 mg Oral Daily  . aspirin EC  81 mg Oral Daily  . carvedilol  6.25 mg Oral BID WC  . [START ON 07/12/2014] cyanocobalamin  1,000 mcg Intramuscular Q30 days  .  docusate sodium  100 mg Oral Daily  . furosemide  40 mg Oral Daily  . guaiFENesin  600 mg Oral BID  . lactose free nutrition  237 mL Oral TID BM  . levothyroxine  88 mcg Oral QAC breakfast  . LORazepam  0.5 mg Oral QHS  . tamsulosin  0.4 mg Oral Daily   Continuous Infusions:  PRN Meds:.acetaminophen, benzonatate, traMADol  Antibiotics: Anti-infectives    None       PHYSICAL EXAM: Vital signs in last 24 hours: Filed Vitals:   06/13/14 0530 06/13/14 1302 06/13/14 2038 06/14/14 0547  BP: 108/48 88/40 101/50 121/55  Pulse: 70 69 70 69  Temp: 98.2 F (36.8 C) 98.1 F (36.7 C) 98.4 F (36.9 C) 97.8 F (36.6 C)  TempSrc: Oral Oral Oral Oral  Resp: 18 18 18 18   Height:      Weight:      SpO2: 99% 98% 98% 98%    Weight change:  Filed Weights   06/12/14 1147 06/12/14 2147  Weight: 49.896 kg (110 lb) 52.935 kg (116 lb 11.2 oz)   Body mass index is 20.02 kg/(m^2).   Gen Exam: Awake and alert with clear speech.  Neck: Supple, No JVD.   Chest: B/L Clear.   CVS: S1 S2 Regular, no murmurs.  Abdomen: soft, BS +, non tender, non distended.  Extremities: no edema, lower extremities warm to touch. Neurologic: Non Focal.   Skin: No Rash.   Wounds: N/A.  Intake/Output from previous day:  Intake/Output Summary (Last 24 hours) at 06/14/14 0940 Last data filed at 06/14/14 0900  Gross per 24 hour  Intake    720 ml  Output    100  ml  Net    620 ml     LAB RESULTS: CBC  Recent Labs Lab 06/12/14 1210  WBC 7.5  HGB 11.5*  HCT 35.6*  PLT 160  MCV 81.5  MCH 26.3  MCHC 32.3  RDW 18.9*    Chemistries   Recent Labs Lab 06/12/14 1210 06/12/14 1317 06/13/14 0655 06/14/14 0530  NA 132* 135* 133* 133*  K 5.0 4.9 4.5 4.2  CL 94* 97 98 100  CO2 19 18* 19 18*  GLUCOSE 117* 107* 103* 118*  BUN 42* 43* 45* 50*  CREATININE 1.93* 1.91* 2.01* 2.05*  CALCIUM 9.2 9.2 8.4 8.1*    CBG:  Recent Labs Lab 06/13/14 0002  GLUCAP 152*    GFR Estimated Creatinine  Clearance: 16.5 mL/min (by C-G formula based on Cr of 2.05).  Coagulation profile No results for input(s): INR, PROTIME in the last 168 hours.  Cardiac Enzymes No results for input(s): CKMB, TROPONINI, MYOGLOBIN in the last 168 hours.  Invalid input(s): CK  Invalid input(s): POCBNP No results for input(s): DDIMER in the last 72 hours. No results for input(s): HGBA1C in the last 72 hours. No results for input(s): CHOL, HDL, LDLCALC, TRIG, CHOLHDL, LDLDIRECT in the last 72 hours. No results for input(s): TSH, T4TOTAL, T3FREE, THYROIDAB in the last 72 hours.  Invalid input(s): FREET3 No results for input(s): VITAMINB12, FOLATE, FERRITIN, TIBC, IRON, RETICCTPCT in the last 72 hours. No results for input(s): LIPASE, AMYLASE in the last 72 hours.  Urine Studies No results for input(s): UHGB, CRYS in the last 72 hours.  Invalid input(s): UACOL, UAPR, USPG, UPH, UTP, UGL, UKET, UBIL, UNIT, UROB, ULEU, UEPI, UWBC, URBC, UBAC, CAST, UCOM, BILUA  MICROBIOLOGY: No results found for this or any previous visit (from the past 240 hour(s)).  RADIOLOGY STUDIES/RESULTS: Dg Chest 2 View  06/12/2014   CLINICAL DATA:  Fall 1 day prior. History of atrial fibrillation. Hypertension.  EXAM: CHEST  2 VIEW  COMPARISON:  May 20, 2014  FINDINGS: There is scarring in the right mid lung and left base regions. There is no frank edema or consolidation. The heart is slightly enlarged with pulmonary vascularity within normal limits. Pacemaker leads are attached to the right atrium and right ventricle. Patient is status post coronary artery bypass grafting. No adenopathy. No pneumothorax. There is arthropathy in both shoulders.  IMPRESSION: Areas of lung scarring bilaterally. No edema or consolidation. Heart prominent but stable.   Electronically Signed   By: Bretta Bang M.D.   On: 06/12/2014 15:00   Dg Chest 2 View  05/20/2014   CLINICAL DATA:  Pain. Weakness. Shortness of breath. Prior infiltrate.  Follow-up study.  EXAM: CHEST  2 VIEW  COMPARISON:  04/18/2014.  06/27/2013.  FINDINGS: Mediastinum and hilar structures are normal. Subsegmental atelectasis and/or scarring right mid lung field again noted. New right lower lobe infiltrate with small pleural effusion noted. Previously identified right upper lobe infiltrate is clear. Small left pleural effusion. Cardiomegaly. Prior CABG. Cardiac pacer and lead tips in right atrium right ventricle. No pneumothorax. No acute bony abnormality. Old posttraumatic changes left proximal humerus.  IMPRESSION: 1. New mild infiltrate right lower lobe with small right pleural effusion. 2. Interim clearing of right upper lobe infiltrate. 3. Stable pleural parenchymal scarring right mid lung field. 4. Stable cardiomegaly. Prior CABG. Cardiac pacer in stable position .   Electronically Signed   By: Maisie Fus  Register   On: 05/20/2014 18:02   Dg Lumbar Spine Complete  06/12/2014  CLINICAL DATA:  Low back pain following fall earlier in the day  EXAM: LUMBAR SPINE - COMPLETE 4+ VIEW  COMPARISON:  February 19, 2012  FINDINGS: Frontal, lateral, spot lumbosacral lateral, and bilateral oblique views were obtained. There are 5 non-rib-bearing lumbar type vertebral bodies. There is no fracture or spondylolisthesis. There is moderately severe disc space narrowing at L4-5 and L5-S1. There is mild disc space narrowing at T12-L1 and L1-2. There is facet osteoarthritic change at all levels bilaterally. There is atherosclerotic change in the aorta as well as in the major mesenteric arteries. There are stents in each renal artery. Bones are osteoporotic.  IMPRESSION: Bones are osteoporotic. There is multilevel osteoarthritic change. No fracture or spondylolisthesis. Extensive atherosclerotic calcification.   Electronically Signed   By: Bretta Bang M.D.   On: 06/12/2014 13:04   Dg Shoulder Right  06/12/2014   CLINICAL DATA:  Fall with right shoulder pain.  EXAM: RIGHT SHOULDER - 2+ VIEW   COMPARISON:  None.  FINDINGS: No acute fracture or dislocation is identified. There are advanced changes of osteoarthritis involving the glenohumeral joint. Milder degenerative changes are seen involving the [facility] joint. The [facility] joint itself shows no obvious sprain or separation. There is suggestion of soft tissue swelling overlying the distal clavicle and acromion. Sclerotic bone island present in the posterior humeral head.  IMPRESSION: No acute fracture. Significant degenerative osteoarthritis of the glenohumeral joint. Soft tissue swelling overlying the AC joint may relate to focal soft tissue injury.   Electronically Signed   By: Irish Lack M.D.   On: 06/12/2014 13:01   Dg Hip Complete Left  06/12/2014   CLINICAL DATA:  Fall this morning.  EXAM: LEFT HIP - COMPLETE 2+ VIEW  COMPARISON:  None.  FINDINGS: Osteopenia. No acute fracture. No dislocation. Degenerative changes in the lumbar spine.  IMPRESSION: No acute bony pathology.   Electronically Signed   By: Maryclare Bean M.D.   On: 06/12/2014 13:01   Ct Head Wo Contrast  06/12/2014   CLINICAL DATA:  Patient fell and found on floor. Complaining of headache and neck pain  EXAM: CT HEAD WITHOUT CONTRAST  CT CERVICAL SPINE WITHOUT CONTRAST  TECHNIQUE: Multidetector CT imaging of the head and cervical spine was performed following the standard protocol without intravenous contrast. Multiplanar CT image reconstructions of the cervical spine were also generated.  COMPARISON:  Head CT February 04, 2014  FINDINGS: CT HEAD FINDINGS  There is moderate diffuse atrophy. There is no intracranial mass, hemorrhage, extra-axial fluid collection, or midline shift. There is small vessel disease in the centra semiovale bilaterally, more on the left than on the right, stable. There is evidence of prior small vessel disease in the anterior limb of the left external capsule, stable. To a lesser degree there is small vessel disease in the anterior limb of the left internal capsule,  stable.  Compared to the prior study, there is decreased attenuation in the mid and posterior superior left temporal lobe extending to the left temple-occipital junction. A recent infarct in this area is of concern. This appearance is best seen on axial slice 14 series 201. No other findings concerning for potential acute infarct identified.  There is a small right occipital scalp hematoma with acute hemorrhage in this area. The bony calvarium appears intact. The mastoid air cells are clear. There is bilateral ethmoid sinus disease.  CT CERVICAL SPINE FINDINGS  There is no fracture or spondylolisthesis. Prevertebral soft tissues and predental space regions are normal.  There is severe disc space narrowing at C5-6. There is moderately severe disc space narrowing at C6-7. There is facet hypertrophy at multiple levels bilaterally. There is no frank disc extrusion or stenosis. Note that there is fairly extensive bony overgrowth at C1 on the left.  There is marked osteoarthritic change in both temporomandibular joints. There is calcification in each carotid artery.  There are bilateral pleural effusions.  IMPRESSION: CT head: Atrophy with supratentorial small vessel disease. Question recent infarct in the left superior mid to posterior temporal lobe. No intracranial hemorrhage or mass effect. There is a small right occipital scalp hematoma. There is ethmoid sinus disease bilaterally.  CT cervical spine: Multilevel osteoarthritic change. No fracture or spondylolisthesis. Calcification is noted in each carotid artery.  There are bilateral pleural effusions. Correlation with chest radiograph may be advisable in this regard.   Electronically Signed   By: Bretta Bang M.D.   On: 06/12/2014 13:29   Ct Cervical Spine Wo Contrast  06/12/2014   CLINICAL DATA:  Patient fell and found on floor. Complaining of headache and neck pain  EXAM: CT HEAD WITHOUT CONTRAST  CT CERVICAL SPINE WITHOUT CONTRAST  TECHNIQUE:  Multidetector CT imaging of the head and cervical spine was performed following the standard protocol without intravenous contrast. Multiplanar CT image reconstructions of the cervical spine were also generated.  COMPARISON:  Head CT February 04, 2014  FINDINGS: CT HEAD FINDINGS  There is moderate diffuse atrophy. There is no intracranial mass, hemorrhage, extra-axial fluid collection, or midline shift. There is small vessel disease in the centra semiovale bilaterally, more on the left than on the right, stable. There is evidence of prior small vessel disease in the anterior limb of the left external capsule, stable. To a lesser degree there is small vessel disease in the anterior limb of the left internal capsule, stable.  Compared to the prior study, there is decreased attenuation in the mid and posterior superior left temporal lobe extending to the left temple-occipital junction. A recent infarct in this area is of concern. This appearance is best seen on axial slice 14 series 201. No other findings concerning for potential acute infarct identified.  There is a small right occipital scalp hematoma with acute hemorrhage in this area. The bony calvarium appears intact. The mastoid air cells are clear. There is bilateral ethmoid sinus disease.  CT CERVICAL SPINE FINDINGS  There is no fracture or spondylolisthesis. Prevertebral soft tissues and predental space regions are normal.  There is severe disc space narrowing at C5-6. There is moderately severe disc space narrowing at C6-7. There is facet hypertrophy at multiple levels bilaterally. There is no frank disc extrusion or stenosis. Note that there is fairly extensive bony overgrowth at C1 on the left.  There is marked osteoarthritic change in both temporomandibular joints. There is calcification in each carotid artery.  There are bilateral pleural effusions.  IMPRESSION: CT head: Atrophy with supratentorial small vessel disease. Question recent infarct in the left  superior mid to posterior temporal lobe. No intracranial hemorrhage or mass effect. There is a small right occipital scalp hematoma. There is ethmoid sinus disease bilaterally.  CT cervical spine: Multilevel osteoarthritic change. No fracture or spondylolisthesis. Calcification is noted in each carotid artery.  There are bilateral pleural effusions. Correlation with chest radiograph may be advisable in this regard.   Electronically Signed   By: Bretta Bang M.D.   On: 06/12/2014 13:29   Ct Hip Left Wo Contrast  06/12/2014   CLINICAL  DATA:  Left hip pain after tripping and falling  EXAM: CT OF THE LEFT HIP WITHOUT CONTRAST  TECHNIQUE: Multidetector CT imaging of the left hip was performed according to the standard protocol. Multiplanar CT image reconstructions were also generated.  COMPARISON:  Pelvis and left hip radiographs June 12, 2014  FINDINGS: There is no demonstrable fracture or dislocation. Bones are osteoporotic. There is slight narrowing of the left hip joint. No erosive change. There is no appreciable hip joint effusion. The surrounding muscular structures appear normal. There is calcification in the wall of the common femoral artery on the left. No soft tissue lesion is identified in the visualized pelvis.  IMPRESSION: No fracture or dislocation. Bones osteoporotic. Mild narrowing left hip joint.   Electronically Signed   By: Bretta Bang M.D.   On: 06/12/2014 18:01    Jeoffrey Massed, MD  [facility] Pager:[phone]  If 7PM-7AM, please contact night-coverage [email or web address] Password [email or web address] 06/14/2014, 9:40 AM   LOS: 2 days

## 2014-06-14 NOTE — Plan of Care (Signed)
Problem: Phase I Progression Outcomes Goal: Pain controlled with appropriate interventions Outcome: Completed/Met Date Met:  06/14/14     

## 2014-06-14 NOTE — Progress Notes (Signed)
Subjective:   Objective: Current vital signs: BP 122/45 mmHg  Pulse 70  Temp(Src) 97.6 F (36.4 C) (Oral)  Resp 18  Ht 5\' 4"  (1.626 m)  Wt 52.935 kg (116 lb 11.2 oz)  BMI 20.02 kg/m2  SpO2 98% Vital signs in last 24 hours: Temp:  [97.6 F (36.4 C)-98.4 F (36.9 C)] 97.6 F (36.4 C) (11/02 1256) Pulse Rate:  [69-70] 70 (11/02 1256) Resp:  [18] 18 (11/02 1256) BP: (101-122)/(45-55) 122/45 mmHg (11/02 1256) SpO2:  [98 %] 98 % (11/02 1256)  Intake/Output from previous day: 11/01 0701 - 11/02 0700 In: 600 [P.O.:600] Out: 100 [Urine:100] Intake/Output this shift: Total I/O In: 240 [P.O.:240] Out: -  Nutritional status: Diet Heart  Neurologic Exam: General: NAD Mental Status: Alert, oriented, thought content appropriate.  Speech fluent without evidence of aphasia.  Able to follow 3 step commands without difficulty. Cranial Nerves: II: Visual fields grossly normal --no diplopia today, pupils equal, round, reactive to light and accommodation III,IV, VI: ptosis not present, extra-ocular motions intact bilaterally V,VII: smile symmetric, facial light touch sensation normal bilaterally VIII: hearing normal bilaterally IX,X: gag reflex present XI: bilateral shoulder shrug XII: midline tongue extension without atrophy or fasciculations  Motor: Right : Upper extremity   5/5    Left:     Upper extremity   5/5 distally--not able to move arm proximally due to shoulder pain from previous RTC tear  Lower extremity   5/5     Lower extremity   5/5 Tone and bulk:normal tone throughout; no atrophy noted Sensory: Pinprick and light touch intact throughout, bilaterally Deep Tendon Reflexes:  Right: Upper Extremity   Left: Upper extremity   biceps (C-5 to C-6) 2/4   biceps (C-5 to C-6) 2/4 tricep (C7) 2/4    triceps (C7) 2/4 Brachioradialis (C6) 2/4  Brachioradialis (C6) 2/4  Lower Extremity Lower Extremity  quadriceps (L-2 to L-4) 2/4   quadriceps (L-2 to L-4) 2/4 Achilles (S1)  0/4   Achilles (S1) 0/4  Plantars: Right: downgoing   Left: downgoing Cerebellar: normal finger-to-nose on the right Gait: not tested. CV: pulses palpable throughout    Lab Results: Basic Metabolic Panel:  Recent Labs Lab 06/12/14 1210 06/12/14 1317 06/13/14 0655 06/14/14 0530  NA 132* 135* 133* 133*  K 5.0 4.9 4.5 4.2  CL 94* 97 98 100  CO2 19 18* 19 18*  GLUCOSE 117* 107* 103* 118*  BUN 42* 43* 45* 50*  CREATININE 1.93* 1.91* 2.01* 2.05*  CALCIUM 9.2 9.2 8.4 8.1*    Liver Function Tests: No results for input(s): AST, ALT, ALKPHOS, BILITOT, PROT, ALBUMIN in the last 168 hours. No results for input(s): LIPASE, AMYLASE in the last 168 hours. No results for input(s): AMMONIA in the last 168 hours.  CBC:  Recent Labs Lab 06/12/14 1210  WBC 7.5  HGB 11.5*  HCT 35.6*  MCV 81.5  PLT 160    Cardiac Enzymes: No results for input(s): CKTOTAL, CKMB, CKMBINDEX, TROPONINI in the last 168 hours.  Lipid Panel: No results for input(s): CHOL, TRIG, HDL, CHOLHDL, VLDL, LDLCALC in the last 168 hours.  CBG:  Recent Labs Lab 06/13/14 0002  GLUCAP 152*    Microbiology: Results for orders placed or performed during the hospital encounter of 05/20/14  Culture, blood (routine x 2) Call MD if unable to obtain prior to antibiotics being given     Status: None   Collection Time: 05/20/14  9:20 PM  Result Value Ref Range Status   Specimen  Description BLOOD HAND RIGHT  Final   Special Requests BOTTLES DRAWN AEROBIC AND ANAEROBIC 8CC  Final   Culture  Setup Time   Final    05/21/2014 01:10 Performed at [facility]   Culture   Final    NO GROWTH 5 DAYS Performed at [facility]   Report Status 05/27/2014 FINAL  Final  Culture, blood (routine x 2) Call MD if unable to obtain prior to antibiotics being given     Status: None   Collection Time: 05/20/14  9:25 PM  Result Value Ref Range Status   Specimen Description BLOOD ARM RIGHT  Final   Special Requests  BOTTLES DRAWN AEROBIC ONLY [facility]  Final   Culture  Setup Time   Final    05/21/2014 01:11 Performed at [facility]   Culture   Final    NO GROWTH 5 DAYS Performed at [facility]   Report Status 05/27/2014 FINAL  Final    Coagulation Studies: No results for input(s): LABPROT, INR in the last 72 hours.  Imaging: Dg Chest 2 View  06/12/2014   CLINICAL DATA:  Fall 1 day prior. History of atrial fibrillation. Hypertension.  EXAM: CHEST  2 VIEW  COMPARISON:  May 20, 2014  FINDINGS: There is scarring in the right mid lung and left base regions. There is no frank edema or consolidation. The heart is slightly enlarged with pulmonary vascularity within normal limits. Pacemaker leads are attached to the right atrium and right ventricle. Patient is status post coronary artery bypass grafting. No adenopathy. No pneumothorax. There is arthropathy in both shoulders.  IMPRESSION: Areas of lung scarring bilaterally. No edema or consolidation. Heart prominent but stable.   Electronically Signed   By: Bretta Bang M.D.   On: 06/12/2014 15:00   Ct Head Wo Contrast  06/14/2014   CLINICAL DATA:  Fall. Trauma to occipital region. Loss of consciousness. Left-sided weakness.  EXAM: CT HEAD WITHOUT CONTRAST  TECHNIQUE: Contiguous axial images were obtained from the base of the skull through the vertex without intravenous contrast.  COMPARISON:  06/12/2014  FINDINGS: There is patchy low attenuation with in the subcortical and periventricular white matter compatible with chronic microvascular disease. Chronic bilateral lacunar infarcts are again noted. Prominence of the sulci and ventricles are noted consistent with brain atrophy. No evidence for acute brain infarct, hemorrhage or mass. Right parietal scalp laceration is identified with skin staples. No underlying skull fracture identified. The paranasal sinuses and the mastoid air cells are clear.  IMPRESSION: 1. No acute intracranial abnormalities.  2. Small vessel ischemic disease and brain atrophy. 3. Right parietal scalp laceration.   Electronically Signed   By: Signa Kell M.D.   On: 06/14/2014 12:11   Ct Hip Left Wo Contrast  06/12/2014   CLINICAL DATA:  Left hip pain after tripping and falling  EXAM: CT OF THE LEFT HIP WITHOUT CONTRAST  TECHNIQUE: Multidetector CT imaging of the left hip was performed according to the standard protocol. Multiplanar CT image reconstructions were also generated.  COMPARISON:  Pelvis and left hip radiographs June 12, 2014  FINDINGS: There is no demonstrable fracture or dislocation. Bones are osteoporotic. There is slight narrowing of the left hip joint. No erosive change. There is no appreciable hip joint effusion. The surrounding muscular structures appear normal. There is calcification in the wall of the common femoral artery on the left. No soft tissue lesion is identified in the visualized pelvis.  IMPRESSION: No fracture or dislocation.  Bones osteoporotic. Mild narrowing left hip joint.   Electronically Signed   By: Bretta Bang M.D.   On: 06/12/2014 18:01    Medications:  Scheduled: . acetaminophen  1,000 mg Oral 3 times per day  . amiodarone  200 mg Oral Daily  . aspirin EC  81 mg Oral Daily  . carvedilol  6.25 mg Oral BID WC  . [START ON 07/12/2014] cyanocobalamin  1,000 mcg Intramuscular Q30 days  . docusate sodium  100 mg Oral Daily  . furosemide  40 mg Oral Daily  . guaiFENesin  600 mg Oral BID  . lactose free nutrition  237 mL Oral TID BM  . levothyroxine  88 mcg Oral QAC breakfast  . LORazepam  0.5 mg Oral QHS  . tamsulosin  0.4 mg Oral Daily   2 D echo: Study Conclusions  - Left ventricle: Systolic function was severely reduced. The estimated ejection fraction was in the range of 20% to 25%. Diffuse hypokinesis. Possible disproportionate hypokinesis of the inferolateral myocardium. Doppler parameters are consistent with restrictive physiology, indicative of  decreased left ventricular diastolic compliance and/or increased left atrial pressure. - Ventricular septum: These changes are consistent with right ventricular pacing. There is profound apical-basal and septal-lateral dyssynchrony. - Aortic valve: There was moderate regurgitation. - Mitral valve: There was moderate regurgitation. The acceleration rate of the regurgitant jet was reduced, consistent with a low dP/dt. - Left atrium: The atrium was moderately dilated. - Right ventricle: The cavity size was mildly dilated. Systolic function was moderately reduced. - Right atrium: The atrium was moderately dilated. - Tricuspid valve: There was malcoaptation of the valve leaflets. There was severe regurgitation directed centrally. A diagnosis of severe regurgitation is supported by hepatic vein systolic flow reversal. - Pulmonary arteries: Systolic pressure was moderately increased. PA peak pressure: 48 mm Hg (S). - Pericardium, extracardiac: There was a right pleural effusion. There was a left pleural effusion.  Carotid doppler pending  A1c and lipid panel pending   Assessment/Plan: 80 YO S/P fall and with addition C/O of diplopia.  Diplopia has since cleared.  Repeat head CT shows no acute infarct. Can not rule out the possibility of transient diplopia being related to head injury.   Recommend: 1) A1C and LDL (pending) 2) Carotid doppler (pending) 3) Continue ASA 81 mg daily.  If above negative no further neurological work up recommended. Will continue to follow results.     Felicie Morn PA-C [facility] [phone]  06/14/2014, 1:29 PM

## 2014-06-14 NOTE — Clinical Social Work Placement (Addendum)
Clinical Social Work Department CLINICAL SOCIAL WORK PLACEMENT NOTE 06/14/2014  Patient:  [name] [name], [name] [name]  Account Number:  [id number] Admit date:  06/12/2014  Clinical Social Worker:  Read Drivers  Date/time:  06/14/2014 01:36 PM  Clinical Social Work is seeking post-discharge placement for this patient at the following level of care:   SKILLED NURSING   (*CSW will update this form in Epic as items are completed)   06/14/2014  Patient/family provided with [facility] Department of Clinical Social Work's list of facilities offering this level of care within the geographic area requested by the patient (or if unable, by the patient's family).  06/14/2014  Patient/family informed of their freedom to choose among providers that offer the needed level of care, that participate in Medicare, Medicaid or managed care program needed by the patient, have an available bed and are willing to accept the patient.  06/14/2014  Patient/family informed of [facility]' ownership interest in [facility], as well as of the fact that they are under no obligation to receive care at this facility.  PASARR submitted to EDS on 06/14/2014 PASARR number received on 06/14/2014  FL2 transmitted to all facilities in geographic area requested by pt/family on  06/14/2014 FL2 transmitted to all facilities within larger geographic area on 06/14/2014  Patient informed that his/her managed care company has contracts with or will negotiate with  certain facilities, including the following:     Patient/family informed of bed offers received:  06/15/2014 [name], [name]) Patient chooses bed at  [facility] [name], [place]) Physician recommends and patient chooses bed at    Patient to be transferred to  [facility] on  06/15/2014 [name], [facility]) Patient to be transferred to facility by [facility] [name], LCSWA) Patient and family notified of transfer on 06/15/2014  [name], [name]) Name of family member notified:  Pt's daughter, [name], updated regarding discharge. [name])   The following physician request were entered in Epic:   Additional Comments:  Vickii Penna, LCSWA [phone]  Psychiatric & Orthopedics (5N 1-16) Clinical Social Worker   [name], [place] [phone]) Licensed Clinical Social Worker Orthopedics [phone]) and Surgical [phone])

## 2014-06-14 NOTE — Clinical Social Work Psychosocial (Signed)
Clinical Social Work Department BRIEF PSYCHOSOCIAL ASSESSMENT 06/14/2014  Patient:  TYRIAH, VALLIERE     Account Number:  [id number]     Admit date:  06/12/2014  Clinical Social Worker:  Read Drivers  Date/Time:  06/14/2014 01:32 PM  Referred by:  Physician  Date Referred:  06/14/2014 Referred for  SNF Placement   Other Referral:   none   Interview type:  Other - See comment Other interview type:   daughter, [name] [phone] or [phone]    PSYCHOSOCIAL DATA Living Status:  ALONE Admitted from facility:   Level of care:   Primary support name:  [name] Primary support relationship to patient:  SIBLING Degree of support available:   adequate    CURRENT CONCERNS Current Concerns  Post-Acute Placement   Other Concerns:   none    SOCIAL WORK ASSESSMENT / PLAN CSW spoke with daughter, [name] at bedside regarding PT recommendation of SNF/STR at time of dc.  Pt and daughter are agreeable to this recommendation.  Pt and daughter first choice is Therapist, occupational and second choice is [facility].  Both were agreeable to SNF search of [facility].    CSW will continue to follow and assist with disposition.   Assessment/plan status:  Psychosocial Support/Ongoing Assessment of Needs Other assessment/ plan:   FL2  PASARR   Information/referral to community resources:   SNF/STR    PATIENT'S/FAMILY'S RESPONSE TO PLAN OF CARE: Pt and daughter were agreeable to SNF/STR search of [place].  [facility]         [place], [place] [phone]  Psychiatric & Orthopedics (5N 1-16) Clinical Social Worker

## 2014-06-15 LAB — URINE CULTURE: Colony Count: >=100000

## 2014-06-15 LAB — LIPID PANEL
CHOLESTEROL: 76 mg/dL (ref 0–200)
HDL: 24 mg/dL — ABNORMAL LOW (ref >39)
LDL Cholesterol: 39 mg/dL (ref 0–99)
Total CHOL/HDL Ratio: 3.2 RATIO
Triglycerides: 66 mg/dL (ref <150)
VLDL: 13 mg/dL (ref 0–40)

## 2014-06-15 LAB — HEMOGLOBIN A1C
HEMOGLOBIN A1C: 6.5 % — AB (ref <5.7)
MEAN PLASMA GLUCOSE: 140 mg/dL — AB (ref <117)

## 2014-06-15 MED ORDER — OXYCODONE HCL 5 MG PO TABS: 5.0000 mg | ORAL_TABLET | Freq: Four times a day (QID) | ORAL | Status: AC | PRN

## 2014-06-15 MED ORDER — LORAZEPAM 0.5 MG PO TABS: 0.5000 mg | ORAL_TABLET | Freq: Every day | ORAL | Status: AC

## 2014-06-15 MED ORDER — CARVEDILOL 6.25 MG PO TABS: 12.5000 mg | ORAL_TABLET | Freq: Two times a day (BID) | ORAL | Status: AC

## 2014-06-15 MED ORDER — ACETAMINOPHEN 500 MG PO TABS: 1000.0000 mg | ORAL_TABLET | Freq: Three times a day (TID) | ORAL | Status: AC

## 2014-06-15 MED ORDER — OXYCODONE HCL 5 MG PO TABS: 5.0000 mg | ORAL_TABLET | Freq: Four times a day (QID) | ORAL | Status: DC | PRN

## 2014-06-15 MED ORDER — BOOST / RESOURCE BREEZE PO LIQD: 1.0000 | Freq: Every day | ORAL | Status: AC

## 2014-06-15 MED ORDER — LORAZEPAM 0.5 MG PO TABS: 0.5000 mg | ORAL_TABLET | Freq: Every day | ORAL | Status: DC

## 2014-06-15 MED ORDER — ENSURE COMPLETE PO LIQD: 237.0000 mL | Freq: Three times a day (TID) | ORAL | Status: AC

## 2014-06-15 NOTE — Discharge Summary (Addendum)
PATIENT DETAILS Name: [name] [name] Age: 80 y.o. Sex: female Date of Birth: 07-25-1925 MRN: [id number]. Admitting Physician: Hillary Bow, DO ION:[id number],UXLKGMW Molly Maduro, MD  Admit Date: 06/12/2014 Discharge date: 06/15/2014  Recommendations for Outpatient Follow-up:  1. Repeat CBC/BMET in 1 week 2. Palliative care consultation at SNF. 3. Will need follow-up with cardiology-primary cardiologist is Dr [place] Lions 4. Is on scheduled Tylenol-1000 mg 3 times a day-be careful with further Tylenol dosing  PRIMARY DISCHARGE DIAGNOSIS:  Principal Problem:   Acute pain of left hip Active Problems:   CKD stage 3, GFR 30-59 ml/min   Chronic systolic CHF (congestive heart failure)   Fall   Diplopia      PAST MEDICAL HISTORY: Past Medical History  Diagnosis Date  . Diabetes mellitus without complication   . DJD (degenerative joint disease)   . Cardiomegaly   . Coronary artery disease     a. LAD stent 1999 after NSTEMI. b. CABG 2004. c. Last LHC 02/2010 - patent grafts.  . Hypertension   . Hyperlipidemia   . Carotid artery disease 05/2003    a. s/p right carotid endarterectomy 05/2003  . Degenerative joint disease   . Restless leg syndrome   . CKD (chronic kidney disease), stage III   . Heparin induced thrombocytopenia   . Diphtheria     "as a child"  . Pleural effusion, bilateral 06/2003  . Pseudoaneurysm 01/1998    a. s/p repair following catherization in 01/1998  . Chronic systolic heart failure 10/02/11  . Ischemic cardiomyopathy     Echo 10/01/11: Mild LVH, EF 35-40%, basal inferior, basal to mid posterior and basal to mid anterolateral severe hypokinesis, mild to moderate AI, moderate MR (ischemic MR), moderate LAE, mild RVE, mildly reduced RV systolic function, mild RAE, PASP 43-44  . COPD (chronic obstructive pulmonary disease)   . Atrial fibrillation/flutter     s/p multiple DCCVs;  amiodarone started 09/2011, TEE DCCV 3/13;  amio and coumadin d/c'd after admxn to [facility]  02/2012 with frequent falls/syncope  . Tachycardia-bradycardia syndrome     a. s/p Medtr[employer] PPM 09/2011: SN: [id number] (8 sec pause noted)  . GERD (gastroesophageal reflux disease)   . Renal artery stenosis     bilateral- status post stenting  . Non-functioning kidney   . Depression     "cries alot"  . Gout   . Hypothyroidism   . Anemia     DISCHARGE MEDICATIONS: Current Discharge Medication List    START taking these medications   Details  acetaminophen (TYLENOL) 500 MG tablet Take 2 tablets (1,000 mg total) by mouth every 8 (eight) hours. Qty: 30 tablet, Refills: 0    oxyCODONE (ROXICODONE) 5 MG immediate release tablet Take 1 tablet (5 mg total) by mouth every 6 (six) hours as needed for severe pain. Qty: 30 tablet, Refills: 0      CONTINUE these medications which have CHANGED   Details  carvedilol (COREG) 6.25 MG tablet Take 2 tablets (12.5 mg total) by mouth 2 (two) times daily with a meal.    !! feeding supplement, ENSURE COMPLETE, (ENSURE COMPLETE) LIQD Take 237 mLs by mouth 3 (three) times daily between meals.    !! feeding supplement, RESOURCE BREEZE, (RESOURCE BREEZE) LIQD Take 1 Container by mouth daily at 3 pm. Refills: 0    LORazepam (ATIVAN) 0.5 MG tablet Take 1 tablet (0.5 mg total) by mouth daily. Qty: 30 tablet, Refills: 0     !! - Potential duplicate medications found. Please discuss with  provider.    CONTINUE these medications which have NOT CHANGED   Details  amiodarone (PACERONE) 200 MG tablet Take 200 mg by mouth daily.     aspirin EC 81 MG tablet Take 81 mg by mouth daily.    benzonatate (TESSALON PERLES) 100 MG capsule Take 1 capsule (100 mg total) by mouth 3 (three) times daily as needed for cough. Qty: 30 capsule, Refills: 0    cyanocobalamin (,VITAMIN B-12,) 1000 MCG/ML injection Inject 1,000 mcg into the muscle every 30 (thirty) days. Done at Dr. Joyce Copa office    docusate sodium (COLACE) 100 MG capsule Take 100 mg by mouth daily.       furosemide (LASIX) 40 MG tablet Take 40 mg by mouth daily.     guaiFENesin (MUCINEX) 600 MG 12 hr tablet Take 1 tablet (600 mg total) by mouth 2 (two) times daily. Qty: 30 tablet, Refills: 0    levothyroxine (SYNTHROID, LEVOTHROID) 88 MCG tablet Take 88 mcg by mouth daily before breakfast.    nitroGLYCERIN (NITROSTAT) 0.4 MG SL tablet Place 0.4 mg under the tongue every 5 (five) minutes as needed for chest pain.    omeprazole (PRILOSEC) 40 MG capsule Take 1 capsule (40 mg total) by mouth daily. Qty: 30 capsule, Refills: 11    potassium chloride (K-DUR,KLOR-CON) 10 MEQ tablet Take 10 mEq by mouth daily.    Tamsulosin HCl (FLOMAX) 0.4 MG CAPS Take 1 capsule (0.4 mg total) by mouth daily. Qty: 30 capsule, Refills: 0      STOP taking these medications     HYDROcodone-acetaminophen (NORCO) 10-325 MG per tablet      isosorbide mononitrate (IMDUR) 15 mg TB24 24 hr tablet         ALLERGIES:   Allergies  Allergen Reactions  . Heparin Other (See Comments)    Clots blood instead of thinning  . Hydromorphone Hcl Other (See Comments)    Crazy feeling  . Warfarin And Related Other (See Comments)    Syncope events  . Altace [Ramipril] Hives  . Contrast Media [Iodinated Diagnostic Agents] Other (See Comments)    Reaction noted by md- might have caused aneurism in 1999  . Morphine And Related Nausea And Vomiting  . Plavix [Clopidogrel Bisulfate] Hives and Itching    BRIEF HPI:  See H&P, Labs, Consult and Test reports for all details in brief, patient is a 80 y.o. female who suffered a mechanical fall at home. She landed on her left side. She was down for about 6 hours before family members found her and called EMS.she complained of significant pain in the left hip area, she underwent a CT scan of the left hip that was negative for a fracture. She was admitted for further supportive care.  CONSULTATIONS:   None  PERTINENT RADIOLOGIC STUDIES: Dg Chest 2 View  06/12/2014    CLINICAL DATA:  Fall 1 day prior. History of atrial fibrillation. Hypertension.  EXAM: CHEST  2 VIEW  COMPARISON:  May 20, 2014  FINDINGS: There is scarring in the right mid lung and left base regions. There is no frank edema or consolidation. The heart is slightly enlarged with pulmonary vascularity within normal limits. Pacemaker leads are attached to the right atrium and right ventricle. Patient is status post coronary artery bypass grafting. No adenopathy. No pneumothorax. There is arthropathy in both shoulders.  IMPRESSION: Areas of lung scarring bilaterally. No edema or consolidation. Heart prominent but stable.   Electronically Signed   By: Bretta Bang M.D.  On: 06/12/2014 15:00   Dg Chest 2 View  05/20/2014   CLINICAL DATA:  Pain. Weakness. Shortness of breath. Prior infiltrate. Follow-up study.  EXAM: CHEST  2 VIEW  COMPARISON:  04/18/2014.  06/27/2013.  FINDINGS: Mediastinum and hilar structures are normal. Subsegmental atelectasis and/or scarring right mid lung field again noted. New right lower lobe infiltrate with small pleural effusion noted. Previously identified right upper lobe infiltrate is clear. Small left pleural effusion. Cardiomegaly. Prior CABG. Cardiac pacer and lead tips in right atrium right ventricle. No pneumothorax. No acute bony abnormality. Old posttraumatic changes left proximal humerus.  IMPRESSION: 1. New mild infiltrate right lower lobe with small right pleural effusion. 2. Interim clearing of right upper lobe infiltrate. 3. Stable pleural parenchymal scarring right mid lung field. 4. Stable cardiomegaly. Prior CABG. Cardiac pacer in stable position .   Electronically Signed   By: Maisie Fus  Register   On: 05/20/2014 18:02   Dg Lumbar Spine Complete  06/12/2014   CLINICAL DATA:  Low back pain following fall earlier in the day  EXAM: LUMBAR SPINE - COMPLETE 4+ VIEW  COMPARISON:  February 19, 2012  FINDINGS: Frontal, lateral, spot lumbosacral lateral, and bilateral oblique  views were obtained. There are 5 non-rib-bearing lumbar type vertebral bodies. There is no fracture or spondylolisthesis. There is moderately severe disc space narrowing at L4-5 and L5-S1. There is mild disc space narrowing at T12-L1 and L1-2. There is facet osteoarthritic change at all levels bilaterally. There is atherosclerotic change in the aorta as well as in the major mesenteric arteries. There are stents in each renal artery. Bones are osteoporotic.  IMPRESSION: Bones are osteoporotic. There is multilevel osteoarthritic change. No fracture or spondylolisthesis. Extensive atherosclerotic calcification.   Electronically Signed   By: Bretta Bang M.D.   On: 06/12/2014 13:04   Dg Shoulder Right  06/12/2014   CLINICAL DATA:  Fall with right shoulder pain.  EXAM: RIGHT SHOULDER - 2+ VIEW  COMPARISON:  None.  FINDINGS: No acute fracture or dislocation is identified. There are advanced changes of osteoarthritis involving the glenohumeral joint. Milder degenerative changes are seen involving the [facility] joint. The [facility] joint itself shows no obvious sprain or separation. There is suggestion of soft tissue swelling overlying the distal clavicle and acromion. Sclerotic bone island present in the posterior humeral head.  IMPRESSION: No acute fracture. Significant degenerative osteoarthritis of the glenohumeral joint. Soft tissue swelling overlying the AC joint may relate to focal soft tissue injury.   Electronically Signed   By: Irish Lack M.D.   On: 06/12/2014 13:01   Dg Hip Complete Left  06/12/2014   CLINICAL DATA:  Fall this morning.  EXAM: LEFT HIP - COMPLETE 2+ VIEW  COMPARISON:  None.  FINDINGS: Osteopenia. No acute fracture. No dislocation. Degenerative changes in the lumbar spine.  IMPRESSION: No acute bony pathology.   Electronically Signed   By: Maryclare Bean M.D.   On: 06/12/2014 13:01   Ct Head Wo Contrast  06/14/2014   CLINICAL DATA:  Fall. Trauma to occipital region. Loss of consciousness.  Left-sided weakness.  EXAM: CT HEAD WITHOUT CONTRAST  TECHNIQUE: Contiguous axial images were obtained from the base of the skull through the vertex without intravenous contrast.  COMPARISON:  06/12/2014  FINDINGS: There is patchy low attenuation with in the subcortical and periventricular white matter compatible with chronic microvascular disease. Chronic bilateral lacunar infarcts are again noted. Prominence of the sulci and ventricles are noted consistent with brain atrophy. No evidence for  acute brain infarct, hemorrhage or mass. Right parietal scalp laceration is identified with skin staples. No underlying skull fracture identified. The paranasal sinuses and the mastoid air cells are clear.  IMPRESSION: 1. No acute intracranial abnormalities. 2. Small vessel ischemic disease and brain atrophy. 3. Right parietal scalp laceration.   Electronically Signed   By: Signa Kell M.D.   On: 06/14/2014 12:11   Ct Head Wo Contrast  06/12/2014   CLINICAL DATA:  Patient fell and found on floor. Complaining of headache and neck pain  EXAM: CT HEAD WITHOUT CONTRAST  CT CERVICAL SPINE WITHOUT CONTRAST  TECHNIQUE: Multidetector CT imaging of the head and cervical spine was performed following the standard protocol without intravenous contrast. Multiplanar CT image reconstructions of the cervical spine were also generated.  COMPARISON:  Head CT February 04, 2014  FINDINGS: CT HEAD FINDINGS  There is moderate diffuse atrophy. There is no intracranial mass, hemorrhage, extra-axial fluid collection, or midline shift. There is small vessel disease in the centra semiovale bilaterally, more on the left than on the right, stable. There is evidence of prior small vessel disease in the anterior limb of the left external capsule, stable. To a lesser degree there is small vessel disease in the anterior limb of the left internal capsule, stable.  Compared to the prior study, there is decreased attenuation in the mid and posterior superior  left temporal lobe extending to the left temple-occipital junction. A recent infarct in this area is of concern. This appearance is best seen on axial slice 14 series 201. No other findings concerning for potential acute infarct identified.  There is a small right occipital scalp hematoma with acute hemorrhage in this area. The bony calvarium appears intact. The mastoid air cells are clear. There is bilateral ethmoid sinus disease.  CT CERVICAL SPINE FINDINGS  There is no fracture or spondylolisthesis. Prevertebral soft tissues and predental space regions are normal.  There is severe disc space narrowing at C5-6. There is moderately severe disc space narrowing at C6-7. There is facet hypertrophy at multiple levels bilaterally. There is no frank disc extrusion or stenosis. Note that there is fairly extensive bony overgrowth at C1 on the left.  There is marked osteoarthritic change in both temporomandibular joints. There is calcification in each carotid artery.  There are bilateral pleural effusions.  IMPRESSION: CT head: Atrophy with supratentorial small vessel disease. Question recent infarct in the left superior mid to posterior temporal lobe. No intracranial hemorrhage or mass effect. There is a small right occipital scalp hematoma. There is ethmoid sinus disease bilaterally.  CT cervical spine: Multilevel osteoarthritic change. No fracture or spondylolisthesis. Calcification is noted in each carotid artery.  There are bilateral pleural effusions. Correlation with chest radiograph may be advisable in this regard.   Electronically Signed   By: Bretta Bang M.D.   On: 06/12/2014 13:29   Ct Cervical Spine Wo Contrast  06/12/2014   CLINICAL DATA:  Patient fell and found on floor. Complaining of headache and neck pain  EXAM: CT HEAD WITHOUT CONTRAST  CT CERVICAL SPINE WITHOUT CONTRAST  TECHNIQUE: Multidetector CT imaging of the head and cervical spine was performed following the standard protocol without  intravenous contrast. Multiplanar CT image reconstructions of the cervical spine were also generated.  COMPARISON:  Head CT February 04, 2014  FINDINGS: CT HEAD FINDINGS  There is moderate diffuse atrophy. There is no intracranial mass, hemorrhage, extra-axial fluid collection, or midline shift. There is small vessel disease in the centra  semiovale bilaterally, more on the left than on the right, stable. There is evidence of prior small vessel disease in the anterior limb of the left external capsule, stable. To a lesser degree there is small vessel disease in the anterior limb of the left internal capsule, stable.  Compared to the prior study, there is decreased attenuation in the mid and posterior superior left temporal lobe extending to the left temple-occipital junction. A recent infarct in this area is of concern. This appearance is best seen on axial slice 14 series 201. No other findings concerning for potential acute infarct identified.  There is a small right occipital scalp hematoma with acute hemorrhage in this area. The bony calvarium appears intact. The mastoid air cells are clear. There is bilateral ethmoid sinus disease.  CT CERVICAL SPINE FINDINGS  There is no fracture or spondylolisthesis. Prevertebral soft tissues and predental space regions are normal.  There is severe disc space narrowing at C5-6. There is moderately severe disc space narrowing at C6-7. There is facet hypertrophy at multiple levels bilaterally. There is no frank disc extrusion or stenosis. Note that there is fairly extensive bony overgrowth at C1 on the left.  There is marked osteoarthritic change in both temporomandibular joints. There is calcification in each carotid artery.  There are bilateral pleural effusions.  IMPRESSION: CT head: Atrophy with supratentorial small vessel disease. Question recent infarct in the left superior mid to posterior temporal lobe. No intracranial hemorrhage or mass effect. There is a small right  occipital scalp hematoma. There is ethmoid sinus disease bilaterally.  CT cervical spine: Multilevel osteoarthritic change. No fracture or spondylolisthesis. Calcification is noted in each carotid artery.  There are bilateral pleural effusions. Correlation with chest radiograph may be advisable in this regard.   Electronically Signed   By: Bretta Bang M.D.   On: 06/12/2014 13:29   Ct Hip Left Wo Contrast  06/12/2014   CLINICAL DATA:  Left hip pain after tripping and falling  EXAM: CT OF THE LEFT HIP WITHOUT CONTRAST  TECHNIQUE: Multidetector CT imaging of the left hip was performed according to the standard protocol. Multiplanar CT image reconstructions were also generated.  COMPARISON:  Pelvis and left hip radiographs June 12, 2014  FINDINGS: There is no demonstrable fracture or dislocation. Bones are osteoporotic. There is slight narrowing of the left hip joint. No erosive change. There is no appreciable hip joint effusion. The surrounding muscular structures appear normal. There is calcification in the wall of the common femoral artery on the left. No soft tissue lesion is identified in the visualized pelvis.  IMPRESSION: No fracture or dislocation. Bones osteoporotic. Mild narrowing left hip joint.   Electronically Signed   By: Bretta Bang M.D.   On: 06/12/2014 18:01     PERTINENT LAB RESULTS: CBC:  Recent Labs  06/12/14 1210  WBC 7.5  HGB 11.5*  HCT 35.6*  PLT 160   CMET CMP     Component Value Date/Time   NA 133* 06/14/2014 0530   K 4.2 06/14/2014 0530   CL 100 06/14/2014 0530   CO2 18* 06/14/2014 0530   GLUCOSE 118* 06/14/2014 0530   BUN 50* 06/14/2014 0530   CREATININE 2.05* 06/14/2014 0530   CREATININE 1.98* 07/16/2012 1055   CALCIUM 8.1* 06/14/2014 0530   PROT 5.9* 05/21/2014 0547   ALBUMIN 2.9* 05/21/2014 0547   AST 12 05/21/2014 0547   ALT <5 05/21/2014 0547   ALKPHOS 71 05/21/2014 0547   BILITOT 0.7 05/21/2014 0547  GFRNONAA 21* 06/14/2014 0530    GFRAA 24* 06/14/2014 0530    GFR Estimated Creatinine Clearance: 16.5 mL/min (by C-G formula based on Cr of 2.05). No results for input(s): LIPASE, AMYLASE in the last 72 hours. No results for input(s): CKTOTAL, CKMB, CKMBINDEX, TROPONINI in the last 72 hours. Invalid input(s): POCBNP No results for input(s): DDIMER in the last 72 hours.  Recent Labs  06/14/14 1403  HGBA1C 6.5*    Recent Labs  06/15/14 0645  CHOL 76  HDL 24*  LDLCALC 39  TRIG 66  CHOLHDL 3.2   No results for input(s): TSH, T4TOTAL, T3FREE, THYROIDAB in the last 72 hours.  Invalid input(s): FREET3 No results for input(s): VITAMINB12, FOLATE, FERRITIN, TIBC, IRON, RETICCTPCT in the last 72 hours. Coags: No results for input(s): INR in the last 72 hours.  Invalid input(s): PT Microbiology: Recent Results (from the past 240 hour(s))  Urine culture     Status: None   Collection Time: 06/13/14  4:10 PM  Result Value Ref Range Status   Specimen Description URINE, RANDOM  Final   Special Requests NONE  Final   Culture  Setup Time   Final    06/14/2014 03:09 Performed at [facility] Count   Final    >=100,000 COLONIES/ML Performed at [facility]    Culture   Final    Multiple bacterial morphotypes present, none predominant. Suggest appropriate recollection if clinically indicated. Performed at [facility]    Report Status 06/15/2014 FINAL  Final     BRIEF HOSPITAL COURSE:   Acute pain of left FKC:LEXNTZG secondary to musculoskeletal etiology from fall. CT of the left hip negative for fracture. PT eval done, able to bear some weight at discharge. Less pain with supportive care. SNF on discharge, family/patient agreeable.  Active Problems: Diplopia:Resolved completely. Some concern for CVA although felt unlikely to account for Diplopia, [place] history of A. Fib, a CT scan of the head was done on 10/31 negative.Seen by neurology, recommendations were to repeat CT  head on 11/2, which was negative as well.  2-D echocardiogram showed an EF around 20-25%. A carotid Dopplerwas contemplated, however review of records showed that she recently had a carotid Doppler on 02/05/2014, hence this was not repeated. Carotid Doppler showed 1-39% ICA stenosis bilaterally. Unable to do MRI as has AICD in place.LDL at 39, A1c at 6.5.  Chronic systolic heart failure: clinically compensated, echocardiogram done this admission shows an EF around 20-25%. Resumed Lasix.continue Coreg.Not on ACEI given CKD.unfortunately, carries a very poor overall prognosis with chronic kidney disease, chronic systolic heart failure, will need palliative care evaluation/consultation while at the skilled nursing facility. She remains a DO NOT RESUSCITATE.Patient's daughter aware of poor overall prognosis.  CKD stage 3, GFR 30-59 ml/min:monitor electrolytes closely.Creatinine close to usual baseline,suspect high risk to develop cardiorenal syndrome as well.palliative care consultation for that skilled nursing facility.  History of atrial fibrillation:rate controlled with amiodarone, Coreg. Not a anticoagulation candidate given history of falls, maintained on aspirin.  History of coronary artery disease status post CABG:stable, continue aspirin,beta blocker.  Hypothyroidism: continue with levothyroxine  GERD:On PPI.  Moderate Protein Calorie Malnutrition:on supplements   TODAY-DAY OF DISCHARGE:  Subjective:   [place] today has no headache,no chest abdominal pain,no new weakness tingling or numbness, feels much better than yesterday. Pain in left hip has decreased.  Objective:   Blood pressure 132/59, pulse 74, temperature 97.4 F (36.3 C), temperature source Oral, resp. rate  18, height 5\' 4"  (1.626 m), weight 52.935 kg (116 lb 11.2 oz), SpO2 97 %.  Intake/Output Summary (Last 24 hours) at 06/15/14 0920 Last data filed at 06/15/14 0239  Gross per 24 hour  Intake    780 ml  Output       0 ml  Net    780 ml   Filed Weights   06/12/14 1147 06/12/14 2147  Weight: 49.896 kg (110 lb) 52.935 kg (116 lb 11.2 oz)    Exam Awake Alert, Oriented *3, No new F.N deficits, Normal affect [place].AT,PERRAL Supple Neck,No JVD, No cervical lymphadenopathy appriciated.  Symmetrical Chest wall movement, Good air movement bilaterally, CTAB RRR,No Gallops,Rubs or new Murmurs, No Parasternal Heave +ve B.Sounds, Abd Soft, Non tender, No organomegaly appriciated, No rebound -guarding or rigidity. No Cyanosis, Clubbing or edema, No new Rash or bruise  DISCHARGE CONDITION: Stable  DISPOSITION: SNF  DISCHARGE INSTRUCTIONS:    Activity:  As tolerated with Full fall precautions use walker/cane & assistance as needed  Diet recommendation: Diabetic Diet Heart Healthy diet  CODE STATUS: DNR  Discharge Instructions    (HEART FAILURE PATIENTS) Call MD:  Anytime you have any of the following symptoms: 1) 3 pound weight gain in 24 hours or 5 pounds in 1 week 2) shortness of breath, with or without a dry hacking cough 3) swelling in the hands, feet or stomach 4) if you have to sleep on extra pillows at night in order to breathe.    Complete by:  As directed      Call MD for:  persistant nausea and vomiting    Complete by:  As directed      Call MD for:  severe uncontrolled pain    Complete by:  As directed      Diet - low sodium heart healthy    Complete by:  As directed      Increase activity slowly    Complete by:  As directed            Follow-up Information    Follow up with Josue HectorNYLAND,LEONARD ROBERT, MD. Schedule an appointment as soon as possible for a visit in 1 week.   Specialty:  Family Medicine   Contact information:   [address] [place] [id number] [phone]         Total Time spent on discharge equals 45 minutes.  SignedJeoffrey Massed: Norberto Wishon 06/15/2014 9:20 AM

## 2014-06-15 NOTE — Plan of Care (Signed)
Problem: Consults Goal: Skin Care Protocol Initiated - if Braden Score 18 or less If consults are not indicated, leave blank or document N/A  Outcome: Completed/Met Date Met:  06/15/14  Problem: Phase I Progression Outcomes Goal: Initial discharge plan identified Outcome: Completed/Met Date Met:  06/15/14 Goal: Voiding-avoid urinary catheter unless indicated Outcome: Completed/Met Date Met:  06/15/14  Problem: Phase II Progression Outcomes Goal: IV changed to normal saline lock Outcome: Completed/Met Date Met:  06/15/14  Problem: Phase III Progression Outcomes Goal: Pain controlled on oral analgesia Outcome: Completed/Met Date Met:  06/15/14 Goal: Voiding independently Outcome: Completed/Met Date Met:  06/15/14

## 2014-06-15 NOTE — Clinical Social Work Note (Signed)
Pt to be discharged to [facility]. Pt's daughter, [name], updated regarding discharge.  [facility]: [phone] Transportation: EMS ([address]  [name], [place] [phone]) Licensed Clinical Social Worker Orthopedics [phone]) and Surgical [phone])

## 2014-06-15 NOTE — Progress Notes (Signed)
Report called to [facility] to [place]. Patient transported to facility via non-emergent EMS with all personal belongings and discharge packet. Vital signs stable. IV removed.

## 2014-06-21 ENCOUNTER — Telehealth: Payer: Self-pay | Admitting: Cardiology

## 2014-06-21 NOTE — Telephone Encounter (Addendum)
Pt daughter called requests a call back how to proceed with pt care// Pt is unable to travel. Please call

## 2014-06-21 NOTE — Telephone Encounter (Signed)
I spoke with [name] [name]'s husband and left a message for [name] Boer to contact our office.

## 2014-06-23 ENCOUNTER — Ambulatory Visit: Payer: Self-pay | Admitting: Cardiology

## 2014-06-23 NOTE — Telephone Encounter (Signed)
Made aware Dr Lysbeth Galas to follow pt and he will contact us if pt needs to be seen.

## 2014-06-23 NOTE — Telephone Encounter (Signed)
She was discharged from the hospitalist service at [facility] after a fall.  Her care will be dictated by Josue Hector, MD.  He can contact us with any specific cardiac questions or issues and we will always be available for consultation with him.

## 2014-07-22 ENCOUNTER — Encounter (HOSPITAL_COMMUNITY): Payer: Self-pay | Admitting: Internal Medicine

## 2014-08-12 ENCOUNTER — Encounter: Payer: Self-pay | Admitting: Internal Medicine

## 2014-08-12 ENCOUNTER — Telehealth: Payer: Self-pay | Admitting: Internal Medicine

## 2014-08-12 NOTE — Telephone Encounter (Signed)
08-12-14 sent past due pacemaker letter, needs allred last seen 01-17-12, last device clinic ck 10-11-11/mt

## 2014-09-13 DEATH — deceased

## 2014-11-20 IMAGING — CT CT HEAD W/O CM
2 series · 16 of 30 positions shown, 20 images · non-contrast
Comparison: 06/12/2014

CLINICAL DATA: Fall. Trauma to occipital region. Loss of
consciousness. Left-sided weakness.

EXAM:
CT HEAD WITHOUT CONTRAST
TECHNIQUE: Contiguous axial images were obtained from the base of the skull
through the vertex without intravenous contrast.

[Series 201: head w/o, idose (1) · axial · non-contrast · 0.49mm/px · z∈[+76,+206]mm · 13 of 32 slices shown, 17 images]
[im 3/32  brain]
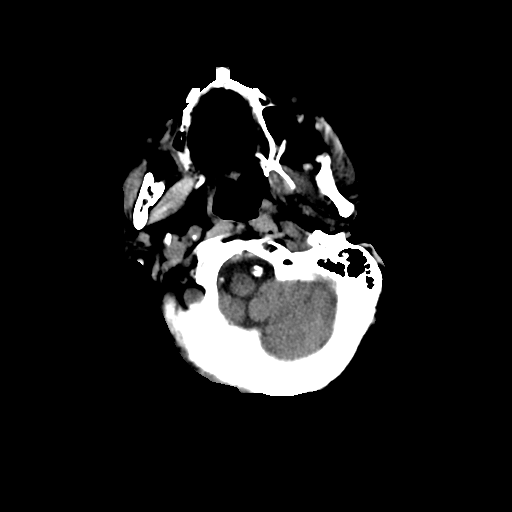
[im 3/32  bone]
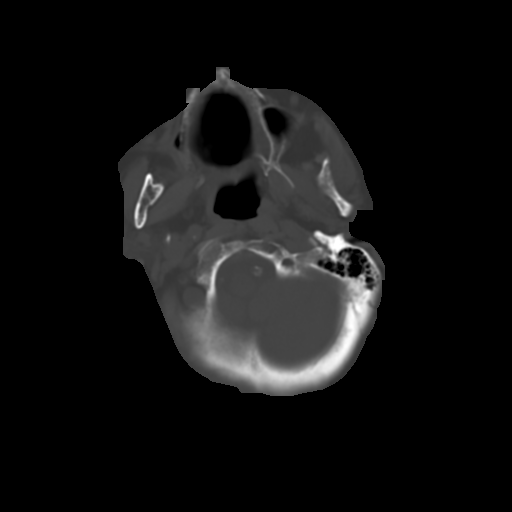
[im 5/32  brain]
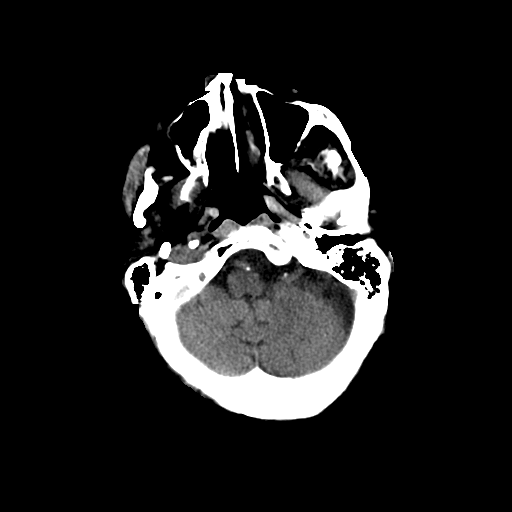
[im 7/32  brain]
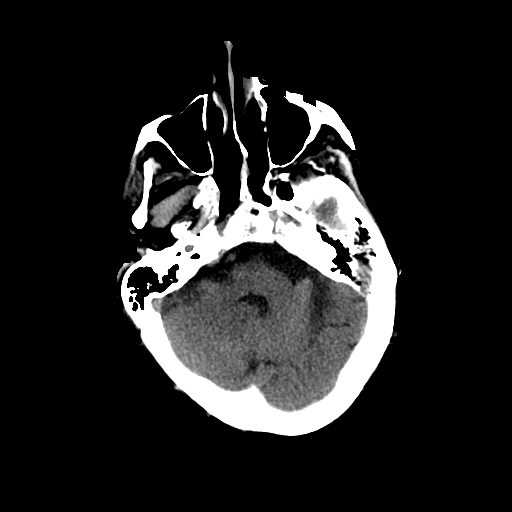
[im 9/32  brain]
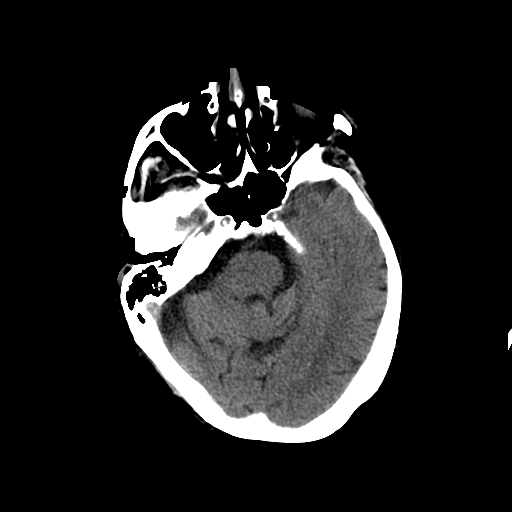
[im 12/32  brain]
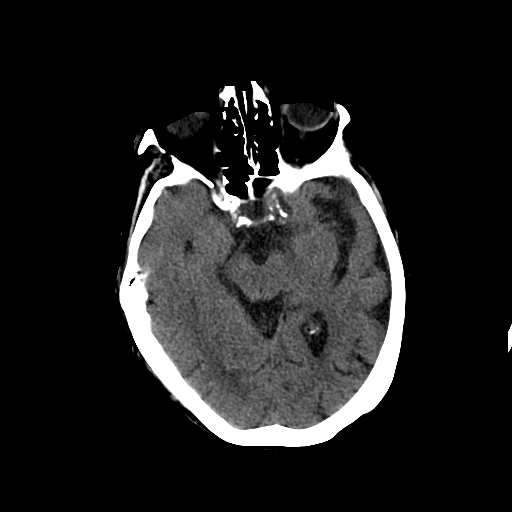
[im 12/32  bone]
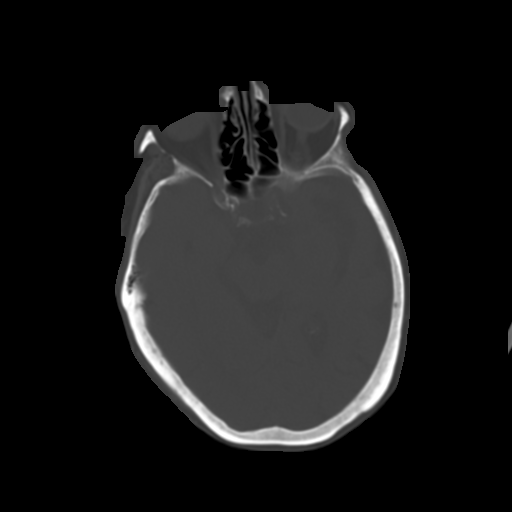
[im 14/32  brain]
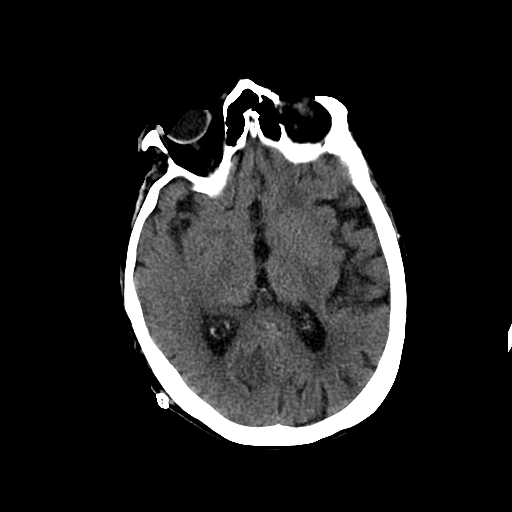
[im 16/32  brain]
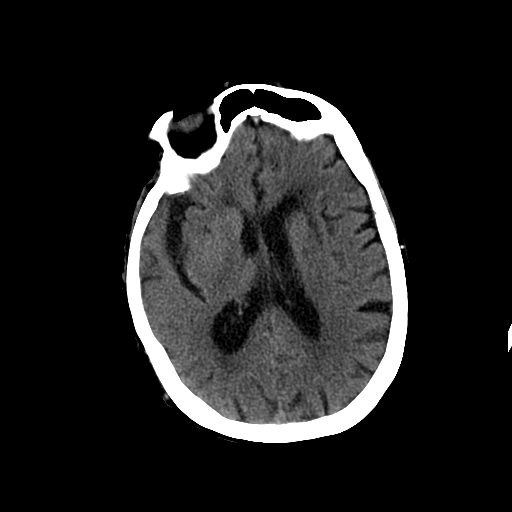
[im 18/32  brain]
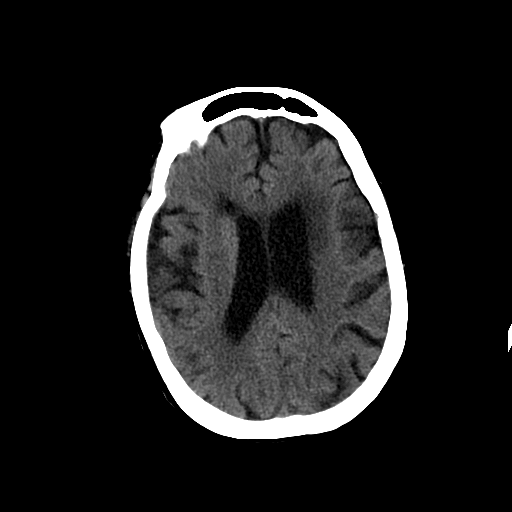
[im 20/32  brain]
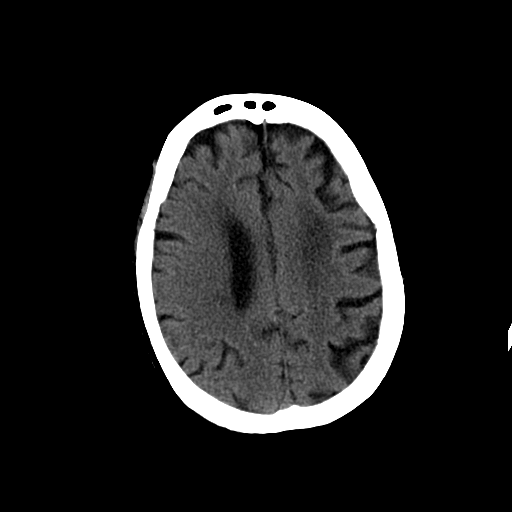
[im 20/32  bone]
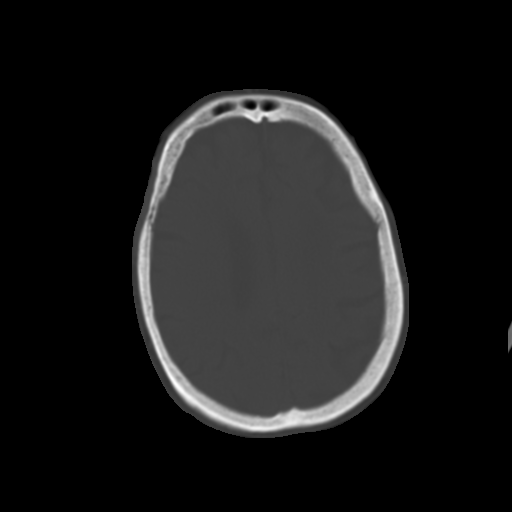
[im 23/32  brain]
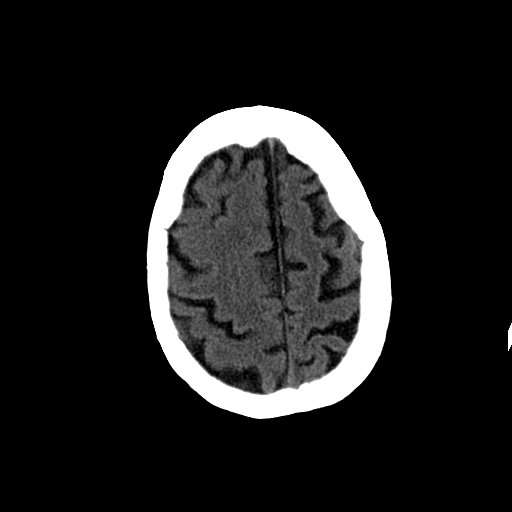
[im 25/32  brain]
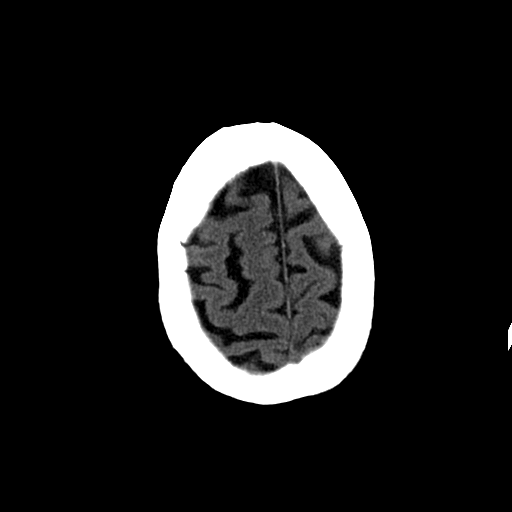
[im 27/32  brain]
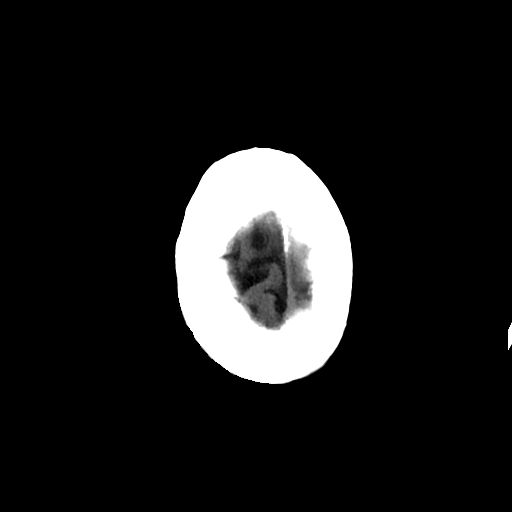
[im 29/32  brain]
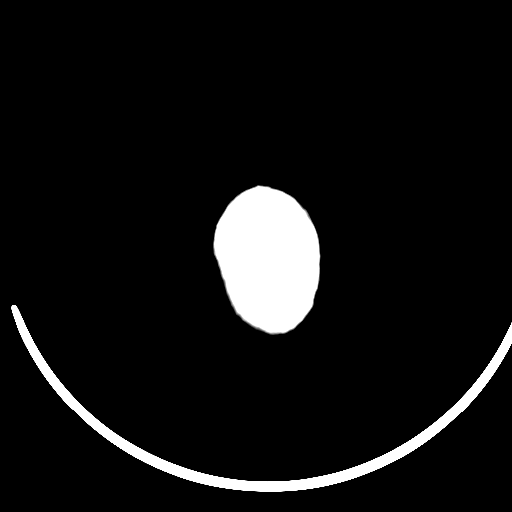
[im 29/32  bone]
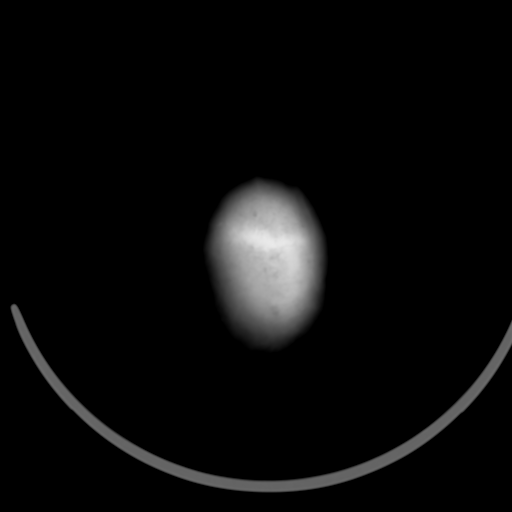

[Series 202: head w/o bone, idose (1) · axial · non-contrast · 0.49mm/px · z∈[+76,+121]mm · 3 of 32 slices shown]
[im 3/32  bone]
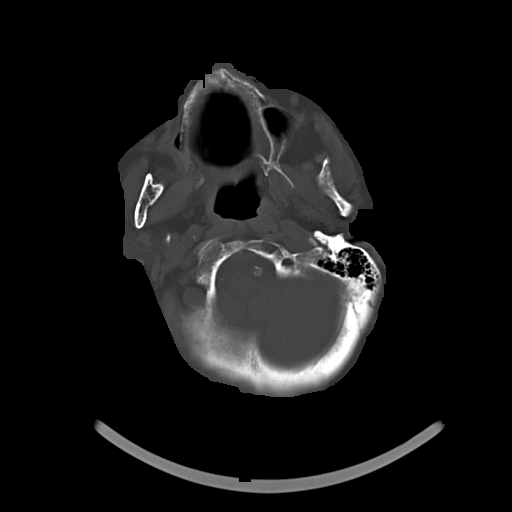
[im 7/32  bone]
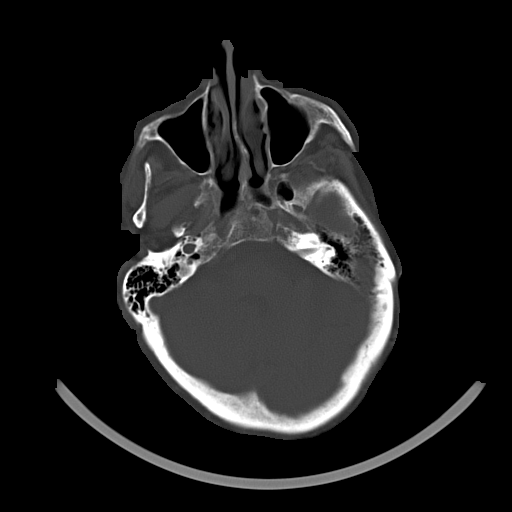
[im 12/32  bone]
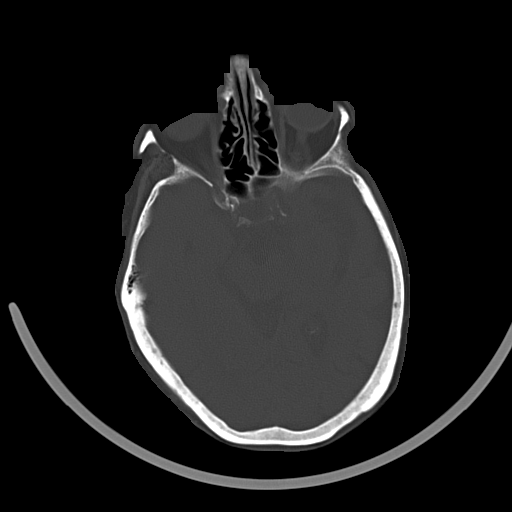

[16 of 30 positions shown; findings below may reference images not displayed]

FINDINGS: There is patchy low attenuation with in the subcortical and
periventricular white matter compatible with chronic microvascular
disease. Chronic bilateral lacunar infarcts are again noted.
Prominence of the sulci and ventricles are noted consistent with
brain atrophy. No evidence for acute brain infarct, hemorrhage or
mass. Right parietal scalp laceration is identified with skin
staples. No underlying skull fracture identified. The paranasal
sinuses and the mastoid air cells are clear.
IMPRESSION: 1. No acute intracranial abnormalities.
2. Small vessel ischemic disease and brain atrophy.
3. Right parietal scalp laceration.
